# Patient Record
Sex: Female | Born: 1940
Health system: Southern US, Community
[De-identification: ages and names within clinical notes are randomized; demographics above are authoritative.]

## PROBLEM LIST (undated history)

## (undated) DIAGNOSIS — M1990 Unspecified osteoarthritis, unspecified site: Secondary | ICD-10-CM

## (undated) DIAGNOSIS — K219 Gastro-esophageal reflux disease without esophagitis: Secondary | ICD-10-CM

## (undated) DIAGNOSIS — R251 Tremor, unspecified: Secondary | ICD-10-CM

## (undated) DIAGNOSIS — I519 Heart disease, unspecified: Secondary | ICD-10-CM

## (undated) DIAGNOSIS — M069 Rheumatoid arthritis, unspecified: Secondary | ICD-10-CM

## (undated) DIAGNOSIS — E079 Disorder of thyroid, unspecified: Secondary | ICD-10-CM

## (undated) DIAGNOSIS — I4891 Unspecified atrial fibrillation: Secondary | ICD-10-CM

## (undated) HISTORY — PX: EYE SURGERY: SHX253

## (undated) HISTORY — PX: BREAST EXCISIONAL BIOPSY: SUR124

## (undated) HISTORY — DX: Rheumatoid arthritis, unspecified: M06.9

## (undated) HISTORY — PX: CHOLECYSTECTOMY: SHX55

## (undated) HISTORY — PX: CARPAL TUNNEL RELEASE: SHX101

## (undated) HISTORY — DX: Unspecified atrial fibrillation: I48.91

## (undated) HISTORY — DX: Unspecified osteoarthritis, unspecified site: M19.90

## (undated) HISTORY — DX: Gastro-esophageal reflux disease without esophagitis: K21.9

## (undated) HISTORY — DX: Disorder of thyroid, unspecified: E07.9

## (undated) HISTORY — PX: JOINT REPLACEMENT: SHX530

## (undated) HISTORY — DX: Heart disease, unspecified: I51.9

## (undated) HISTORY — DX: Tremor, unspecified: R25.1

---

## 1985-03-06 HISTORY — PX: BREAST BIOPSY: SHX20

## 1986-03-06 HISTORY — PX: BREAST BIOPSY: SHX20

## 1996-03-06 HISTORY — PX: REPLACEMENT TOTAL KNEE: SUR1224

## 2003-03-07 HISTORY — PX: REPLACEMENT TOTAL KNEE: SUR1224

## 2003-11-24 ENCOUNTER — Other Ambulatory Visit: Payer: Self-pay

## 2004-03-06 HISTORY — PX: COLONOSCOPY: SHX174

## 2004-10-28 ENCOUNTER — Ambulatory Visit: Payer: Self-pay | Admitting: Family Medicine

## 2004-11-24 ENCOUNTER — Ambulatory Visit: Payer: Self-pay | Admitting: General Surgery

## 2004-11-24 LAB — HM COLONOSCOPY: HM Colonoscopy: NORMAL

## 2006-03-22 ENCOUNTER — Ambulatory Visit: Payer: Self-pay | Admitting: Family Medicine

## 2006-09-12 ENCOUNTER — Emergency Department: Payer: Self-pay | Admitting: Emergency Medicine

## 2006-09-12 ENCOUNTER — Other Ambulatory Visit: Payer: Self-pay

## 2007-07-10 ENCOUNTER — Ambulatory Visit: Payer: Self-pay | Admitting: Family Medicine

## 2008-08-14 ENCOUNTER — Ambulatory Visit: Payer: Self-pay | Admitting: Family Medicine

## 2008-08-26 DIAGNOSIS — N951 Menopausal and female climacteric states: Secondary | ICD-10-CM | POA: Insufficient documentation

## 2008-11-14 DIAGNOSIS — K219 Gastro-esophageal reflux disease without esophagitis: Secondary | ICD-10-CM | POA: Insufficient documentation

## 2008-11-16 LAB — HM DEXA SCAN: HM Dexa Scan: NORMAL

## 2008-11-21 ENCOUNTER — Ambulatory Visit: Payer: Self-pay | Admitting: Internal Medicine

## 2008-11-27 ENCOUNTER — Ambulatory Visit: Payer: Self-pay | Admitting: Internal Medicine

## 2009-01-21 ENCOUNTER — Ambulatory Visit: Payer: Self-pay | Admitting: Otolaryngology

## 2010-02-02 ENCOUNTER — Ambulatory Visit: Payer: Self-pay | Admitting: Family Medicine

## 2011-04-24 DIAGNOSIS — Z79899 Other long term (current) drug therapy: Secondary | ICD-10-CM | POA: Diagnosis not present

## 2011-04-24 DIAGNOSIS — M069 Rheumatoid arthritis, unspecified: Secondary | ICD-10-CM | POA: Diagnosis not present

## 2011-05-30 ENCOUNTER — Ambulatory Visit: Payer: Self-pay | Admitting: Family Medicine

## 2011-05-30 DIAGNOSIS — Z1231 Encounter for screening mammogram for malignant neoplasm of breast: Secondary | ICD-10-CM | POA: Diagnosis not present

## 2011-06-13 DIAGNOSIS — Z79899 Other long term (current) drug therapy: Secondary | ICD-10-CM | POA: Diagnosis not present

## 2011-06-13 DIAGNOSIS — M069 Rheumatoid arthritis, unspecified: Secondary | ICD-10-CM | POA: Diagnosis not present

## 2011-07-25 DIAGNOSIS — M069 Rheumatoid arthritis, unspecified: Secondary | ICD-10-CM | POA: Diagnosis not present

## 2011-07-25 DIAGNOSIS — M159 Polyosteoarthritis, unspecified: Secondary | ICD-10-CM | POA: Diagnosis not present

## 2011-08-04 DIAGNOSIS — E039 Hypothyroidism, unspecified: Secondary | ICD-10-CM | POA: Diagnosis not present

## 2011-08-04 DIAGNOSIS — Z0000 Encounter for general adult medical examination without abnormal findings: Secondary | ICD-10-CM | POA: Diagnosis not present

## 2011-08-04 DIAGNOSIS — E559 Vitamin D deficiency, unspecified: Secondary | ICD-10-CM | POA: Diagnosis not present

## 2011-08-04 DIAGNOSIS — E7800 Pure hypercholesterolemia, unspecified: Secondary | ICD-10-CM | POA: Diagnosis not present

## 2011-08-04 DIAGNOSIS — I10 Essential (primary) hypertension: Secondary | ICD-10-CM | POA: Diagnosis not present

## 2011-08-04 DIAGNOSIS — IMO0001 Reserved for inherently not codable concepts without codable children: Secondary | ICD-10-CM | POA: Diagnosis not present

## 2011-08-04 DIAGNOSIS — K219 Gastro-esophageal reflux disease without esophagitis: Secondary | ICD-10-CM | POA: Diagnosis not present

## 2011-09-05 DIAGNOSIS — M069 Rheumatoid arthritis, unspecified: Secondary | ICD-10-CM | POA: Diagnosis not present

## 2011-09-05 DIAGNOSIS — Z79899 Other long term (current) drug therapy: Secondary | ICD-10-CM | POA: Diagnosis not present

## 2011-10-16 DIAGNOSIS — M069 Rheumatoid arthritis, unspecified: Secondary | ICD-10-CM | POA: Diagnosis not present

## 2011-12-04 DIAGNOSIS — Z79899 Other long term (current) drug therapy: Secondary | ICD-10-CM | POA: Diagnosis not present

## 2011-12-04 DIAGNOSIS — M069 Rheumatoid arthritis, unspecified: Secondary | ICD-10-CM | POA: Diagnosis not present

## 2011-12-27 DIAGNOSIS — Z23 Encounter for immunization: Secondary | ICD-10-CM | POA: Diagnosis not present

## 2012-01-18 DIAGNOSIS — H25049 Posterior subcapsular polar age-related cataract, unspecified eye: Secondary | ICD-10-CM | POA: Diagnosis not present

## 2012-01-22 DIAGNOSIS — M069 Rheumatoid arthritis, unspecified: Secondary | ICD-10-CM | POA: Diagnosis not present

## 2012-01-22 DIAGNOSIS — M545 Low back pain: Secondary | ICD-10-CM | POA: Diagnosis not present

## 2012-01-22 DIAGNOSIS — Z79899 Other long term (current) drug therapy: Secondary | ICD-10-CM | POA: Diagnosis not present

## 2012-03-12 DIAGNOSIS — Z79899 Other long term (current) drug therapy: Secondary | ICD-10-CM | POA: Diagnosis not present

## 2012-03-12 DIAGNOSIS — M069 Rheumatoid arthritis, unspecified: Secondary | ICD-10-CM | POA: Diagnosis not present

## 2012-04-17 DIAGNOSIS — Z0000 Encounter for general adult medical examination without abnormal findings: Secondary | ICD-10-CM | POA: Diagnosis not present

## 2012-04-17 DIAGNOSIS — E039 Hypothyroidism, unspecified: Secondary | ICD-10-CM | POA: Diagnosis not present

## 2012-04-17 DIAGNOSIS — N3090 Cystitis, unspecified without hematuria: Secondary | ICD-10-CM | POA: Diagnosis not present

## 2012-04-17 DIAGNOSIS — J069 Acute upper respiratory infection, unspecified: Secondary | ICD-10-CM | POA: Diagnosis not present

## 2012-05-09 DIAGNOSIS — M069 Rheumatoid arthritis, unspecified: Secondary | ICD-10-CM | POA: Diagnosis not present

## 2012-05-09 DIAGNOSIS — Z79899 Other long term (current) drug therapy: Secondary | ICD-10-CM | POA: Diagnosis not present

## 2012-07-01 DIAGNOSIS — M25519 Pain in unspecified shoulder: Secondary | ICD-10-CM | POA: Diagnosis not present

## 2012-07-01 DIAGNOSIS — M069 Rheumatoid arthritis, unspecified: Secondary | ICD-10-CM | POA: Diagnosis not present

## 2012-07-18 DIAGNOSIS — H1045 Other chronic allergic conjunctivitis: Secondary | ICD-10-CM | POA: Diagnosis not present

## 2012-07-30 DIAGNOSIS — J019 Acute sinusitis, unspecified: Secondary | ICD-10-CM | POA: Diagnosis not present

## 2012-07-30 DIAGNOSIS — J069 Acute upper respiratory infection, unspecified: Secondary | ICD-10-CM | POA: Diagnosis not present

## 2012-07-30 DIAGNOSIS — E039 Hypothyroidism, unspecified: Secondary | ICD-10-CM | POA: Diagnosis not present

## 2012-07-30 DIAGNOSIS — Z0000 Encounter for general adult medical examination without abnormal findings: Secondary | ICD-10-CM | POA: Diagnosis not present

## 2012-08-20 DIAGNOSIS — M069 Rheumatoid arthritis, unspecified: Secondary | ICD-10-CM | POA: Diagnosis not present

## 2012-08-20 DIAGNOSIS — Z79899 Other long term (current) drug therapy: Secondary | ICD-10-CM | POA: Diagnosis not present

## 2012-10-08 ENCOUNTER — Ambulatory Visit: Payer: Self-pay | Admitting: Family Medicine

## 2012-10-08 DIAGNOSIS — Z1231 Encounter for screening mammogram for malignant neoplasm of breast: Secondary | ICD-10-CM | POA: Diagnosis not present

## 2012-10-08 LAB — HM MAMMOGRAPHY

## 2012-10-09 DIAGNOSIS — M069 Rheumatoid arthritis, unspecified: Secondary | ICD-10-CM | POA: Diagnosis not present

## 2012-10-09 DIAGNOSIS — Z79899 Other long term (current) drug therapy: Secondary | ICD-10-CM | POA: Diagnosis not present

## 2012-11-04 DIAGNOSIS — N390 Urinary tract infection, site not specified: Secondary | ICD-10-CM | POA: Diagnosis not present

## 2012-11-04 DIAGNOSIS — R300 Dysuria: Secondary | ICD-10-CM | POA: Diagnosis not present

## 2012-11-11 DIAGNOSIS — E039 Hypothyroidism, unspecified: Secondary | ICD-10-CM | POA: Diagnosis not present

## 2012-11-11 DIAGNOSIS — Z0000 Encounter for general adult medical examination without abnormal findings: Secondary | ICD-10-CM | POA: Diagnosis not present

## 2012-11-11 DIAGNOSIS — G250 Essential tremor: Secondary | ICD-10-CM | POA: Diagnosis not present

## 2012-11-11 DIAGNOSIS — G4700 Insomnia, unspecified: Secondary | ICD-10-CM | POA: Diagnosis not present

## 2012-11-14 DIAGNOSIS — E039 Hypothyroidism, unspecified: Secondary | ICD-10-CM | POA: Diagnosis not present

## 2012-11-14 DIAGNOSIS — I10 Essential (primary) hypertension: Secondary | ICD-10-CM | POA: Diagnosis not present

## 2012-11-14 DIAGNOSIS — E7800 Pure hypercholesterolemia, unspecified: Secondary | ICD-10-CM | POA: Diagnosis not present

## 2012-11-14 DIAGNOSIS — IMO0001 Reserved for inherently not codable concepts without codable children: Secondary | ICD-10-CM | POA: Diagnosis not present

## 2012-11-19 DIAGNOSIS — M069 Rheumatoid arthritis, unspecified: Secondary | ICD-10-CM | POA: Diagnosis not present

## 2012-12-11 DIAGNOSIS — G250 Essential tremor: Secondary | ICD-10-CM | POA: Diagnosis not present

## 2012-12-24 DIAGNOSIS — Z23 Encounter for immunization: Secondary | ICD-10-CM | POA: Diagnosis not present

## 2013-01-06 DIAGNOSIS — M069 Rheumatoid arthritis, unspecified: Secondary | ICD-10-CM | POA: Diagnosis not present

## 2013-01-06 DIAGNOSIS — M25519 Pain in unspecified shoulder: Secondary | ICD-10-CM | POA: Diagnosis not present

## 2013-01-06 DIAGNOSIS — M159 Polyosteoarthritis, unspecified: Secondary | ICD-10-CM | POA: Diagnosis not present

## 2013-01-06 DIAGNOSIS — Z79899 Other long term (current) drug therapy: Secondary | ICD-10-CM | POA: Diagnosis not present

## 2013-01-27 DIAGNOSIS — Z0000 Encounter for general adult medical examination without abnormal findings: Secondary | ICD-10-CM | POA: Diagnosis not present

## 2013-01-27 DIAGNOSIS — N3000 Acute cystitis without hematuria: Secondary | ICD-10-CM | POA: Diagnosis not present

## 2013-01-27 DIAGNOSIS — G250 Essential tremor: Secondary | ICD-10-CM | POA: Diagnosis not present

## 2013-01-27 DIAGNOSIS — N3090 Cystitis, unspecified without hematuria: Secondary | ICD-10-CM | POA: Diagnosis not present

## 2013-01-27 DIAGNOSIS — E039 Hypothyroidism, unspecified: Secondary | ICD-10-CM | POA: Diagnosis not present

## 2013-02-24 DIAGNOSIS — M069 Rheumatoid arthritis, unspecified: Secondary | ICD-10-CM | POA: Diagnosis not present

## 2013-04-07 DIAGNOSIS — M069 Rheumatoid arthritis, unspecified: Secondary | ICD-10-CM | POA: Diagnosis not present

## 2013-04-07 DIAGNOSIS — Z79899 Other long term (current) drug therapy: Secondary | ICD-10-CM | POA: Diagnosis not present

## 2013-04-16 DIAGNOSIS — G250 Essential tremor: Secondary | ICD-10-CM | POA: Diagnosis not present

## 2013-04-16 DIAGNOSIS — E669 Obesity, unspecified: Secondary | ICD-10-CM | POA: Diagnosis not present

## 2013-05-20 DIAGNOSIS — M069 Rheumatoid arthritis, unspecified: Secondary | ICD-10-CM | POA: Diagnosis not present

## 2013-06-11 DIAGNOSIS — J329 Chronic sinusitis, unspecified: Secondary | ICD-10-CM | POA: Diagnosis not present

## 2013-06-11 DIAGNOSIS — R259 Unspecified abnormal involuntary movements: Secondary | ICD-10-CM | POA: Diagnosis not present

## 2013-06-11 DIAGNOSIS — M069 Rheumatoid arthritis, unspecified: Secondary | ICD-10-CM | POA: Insufficient documentation

## 2013-06-11 DIAGNOSIS — M1990 Unspecified osteoarthritis, unspecified site: Secondary | ICD-10-CM | POA: Insufficient documentation

## 2013-06-11 DIAGNOSIS — Z09 Encounter for follow-up examination after completed treatment for conditions other than malignant neoplasm: Secondary | ICD-10-CM | POA: Diagnosis not present

## 2013-06-11 DIAGNOSIS — Z0000 Encounter for general adult medical examination without abnormal findings: Secondary | ICD-10-CM | POA: Diagnosis not present

## 2013-06-17 DIAGNOSIS — M542 Cervicalgia: Secondary | ICD-10-CM | POA: Diagnosis not present

## 2013-06-17 DIAGNOSIS — M069 Rheumatoid arthritis, unspecified: Secondary | ICD-10-CM | POA: Diagnosis not present

## 2013-06-17 DIAGNOSIS — M25519 Pain in unspecified shoulder: Secondary | ICD-10-CM | POA: Diagnosis not present

## 2013-06-19 DIAGNOSIS — G250 Essential tremor: Secondary | ICD-10-CM | POA: Diagnosis not present

## 2013-06-26 DIAGNOSIS — R6889 Other general symptoms and signs: Secondary | ICD-10-CM | POA: Diagnosis not present

## 2013-07-01 DIAGNOSIS — M069 Rheumatoid arthritis, unspecified: Secondary | ICD-10-CM | POA: Diagnosis not present

## 2013-07-01 DIAGNOSIS — Z79899 Other long term (current) drug therapy: Secondary | ICD-10-CM | POA: Diagnosis not present

## 2013-07-23 DIAGNOSIS — Z0000 Encounter for general adult medical examination without abnormal findings: Secondary | ICD-10-CM | POA: Diagnosis not present

## 2013-07-23 DIAGNOSIS — M069 Rheumatoid arthritis, unspecified: Secondary | ICD-10-CM | POA: Diagnosis not present

## 2013-07-23 DIAGNOSIS — R259 Unspecified abnormal involuntary movements: Secondary | ICD-10-CM | POA: Diagnosis not present

## 2013-07-23 DIAGNOSIS — N3090 Cystitis, unspecified without hematuria: Secondary | ICD-10-CM | POA: Diagnosis not present

## 2013-08-19 DIAGNOSIS — M069 Rheumatoid arthritis, unspecified: Secondary | ICD-10-CM | POA: Diagnosis not present

## 2013-08-19 DIAGNOSIS — Z79899 Other long term (current) drug therapy: Secondary | ICD-10-CM | POA: Diagnosis not present

## 2013-08-28 DIAGNOSIS — M069 Rheumatoid arthritis, unspecified: Secondary | ICD-10-CM | POA: Diagnosis not present

## 2013-08-28 DIAGNOSIS — R259 Unspecified abnormal involuntary movements: Secondary | ICD-10-CM | POA: Diagnosis not present

## 2013-08-28 DIAGNOSIS — I4891 Unspecified atrial fibrillation: Secondary | ICD-10-CM | POA: Diagnosis not present

## 2013-08-28 DIAGNOSIS — Z0000 Encounter for general adult medical examination without abnormal findings: Secondary | ICD-10-CM | POA: Diagnosis not present

## 2013-08-29 DIAGNOSIS — I4891 Unspecified atrial fibrillation: Secondary | ICD-10-CM | POA: Diagnosis not present

## 2013-09-04 DIAGNOSIS — I4891 Unspecified atrial fibrillation: Secondary | ICD-10-CM | POA: Diagnosis not present

## 2013-09-10 DIAGNOSIS — I4891 Unspecified atrial fibrillation: Secondary | ICD-10-CM | POA: Diagnosis not present

## 2013-09-17 DIAGNOSIS — I4891 Unspecified atrial fibrillation: Secondary | ICD-10-CM | POA: Diagnosis not present

## 2013-09-30 DIAGNOSIS — M069 Rheumatoid arthritis, unspecified: Secondary | ICD-10-CM | POA: Diagnosis not present

## 2013-10-22 DIAGNOSIS — M069 Rheumatoid arthritis, unspecified: Secondary | ICD-10-CM | POA: Diagnosis not present

## 2013-10-22 DIAGNOSIS — I4891 Unspecified atrial fibrillation: Secondary | ICD-10-CM | POA: Diagnosis not present

## 2013-10-22 DIAGNOSIS — K219 Gastro-esophageal reflux disease without esophagitis: Secondary | ICD-10-CM | POA: Diagnosis not present

## 2013-10-22 DIAGNOSIS — Z0000 Encounter for general adult medical examination without abnormal findings: Secondary | ICD-10-CM | POA: Diagnosis not present

## 2013-10-23 ENCOUNTER — Encounter: Payer: Self-pay | Admitting: *Deleted

## 2013-11-05 ENCOUNTER — Encounter: Payer: Self-pay | Admitting: General Surgery

## 2013-11-06 ENCOUNTER — Encounter: Payer: Self-pay | Admitting: General Surgery

## 2013-11-06 ENCOUNTER — Ambulatory Visit (INDEPENDENT_AMBULATORY_CARE_PROVIDER_SITE_OTHER): Payer: Medicare Other | Admitting: General Surgery

## 2013-11-06 VITALS — BP 118/68 | HR 62 | Resp 14 | Ht 63.5 in | Wt 248.0 lb

## 2013-11-06 DIAGNOSIS — K219 Gastro-esophageal reflux disease without esophagitis: Secondary | ICD-10-CM | POA: Diagnosis not present

## 2013-11-06 NOTE — Patient Instructions (Signed)
Patient advised to check her pulse rate 4 times a day for 1 week. After 1 week start taking 1 Metoprolol at bedtime for 1 week. Keep a record of your pulse rate and return record to our office. Patient to return as needed.

## 2013-11-06 NOTE — Progress Notes (Signed)
Patient ID: Glenda Williams, female   DOB: [birth year], 73 y.o.   MRN: [id number]  Chief Complaint  Patient presents with  . Other    reflux disease    HPI Glenda Williams is a 73 y.o. female.  Here today for evaluation of reflux disease. She states she has had this issue for approximately 2 months. She believes it's associated around a new medication that she started taking at that time.  She describes her symptoms as boating and burning sensation in the stomach and neck and chest area. She has experienced some belching and gas. She has recently been diagnosed with Afib and is unsure is this is all related. Patient started new tremors medication approximately 7 months ago. She also started Metoprolol 2 months ago. Patient to see Dr. Ubaldo Williams on December 16, 2013. She seems to notice the heart burn issues after taking her medications. She does take her medications after eating. These episodes last for approximately 30 minutes at a time. No trouble swallowing.  The patient is able to get rapid relief of her symptoms with Gas-X.  HPI  Past Medical History  Diagnosis Date  . GERD (gastroesophageal reflux disease)   . Heart disease   . Thyroid disease   . Arthritis   . Rheumatoid arthritis   . A-fib   . Tremors of nervous system     Past Surgical History  Procedure Laterality Date  . Cholecystectomy    . Joint replacement Bilateral     knees  . Carpal tunnel release Bilateral   . Colonoscopy  2006  . Breast biopsy Left 1988    Family History  Problem Relation Age of Onset  . Stroke Mother   . Heart disease Father     Social History History  Substance Use Topics  . Smoking status: Never Smoker   . Smokeless tobacco: Not on file  . Alcohol Use: No    Allergies  Allergen Reactions  . Penicillins Rash    Current Outpatient Prescriptions  Medication Sig Dispense Refill  . aspirin 81 MG tablet Take 81 mg by mouth daily.      . cholecalciferol (VITAMIN D) 1000 UNITS tablet Take  1,000 Units by mouth daily.      . folic acid (FOLVITE) 1 MG tablet Take 1 mg by mouth daily.      . hydrochlorothiazide (HYDRODIURIL) 12.5 MG tablet Take 12.5 mg by mouth daily.      Marland Kitchen ibuprofen (ADVIL,MOTRIN) 800 MG tablet Take 800 mg by mouth every 6 (six) hours as needed.       . InFLIXimab (REMICADE IV) Inject into the vein. Every 6-8 weeks      . levothyroxine (SYNTHROID, LEVOTHROID) 75 MCG tablet Take 75 mcg by mouth daily before breakfast.       . methotrexate (RHEUMATREX) 2.5 MG tablet 8 tabs by mouth weekly      . metoprolol tartrate (LOPRESSOR) 25 MG tablet Take 25 mg by mouth 2 (two) times daily.       . Omega-3 Fatty Acids (FISH OIL) 1000 MG CAPS Take 1 capsule by mouth daily.      Marland Kitchen omeprazole (PRILOSEC) 20 MG capsule Take 20 mg by mouth 2 (two) times daily before a meal.       . primidone (MYSOLINE) 50 MG tablet Take 50 mg by mouth at bedtime.       . Red Yeast Rice 600 MG CAPS Take 1 capsule by mouth daily.      Marland Kitchen  Simethicone (GAS-X PO) Take 2 capsules by mouth at bedtime.       No current facility-administered medications for this visit.    Review of Systems Review of Systems  Constitutional: Negative.   Cardiovascular: Negative.     Blood pressure 118/68, pulse 62, resp. rate 14, height 5' 3.5" (1.613 m), weight 248 lb (112.492 kg).  Physical Exam Physical Exam  Constitutional: She is oriented to person, place, and time. She appears well-developed and well-nourished.  Cardiovascular: Normal rate, regular rhythm and normal heart sounds.   No murmur heard. Edema present in lower legs and feet  Pulmonary/Chest: Effort normal and breath sounds normal.  Abdominal: Soft. Normal appearance and bowel sounds are normal. There is no hepatosplenomegaly. There is no tenderness.  Neurological: She is alert and oriented to person, place, and time.  Skin: Skin is warm and dry.    Data Reviewed PCP notes of October 22, 2013 were reviewed.  Assessment    Heartburn,  temporally related to initiation of metoprolol.     Plan    The patient has been asked to check her pulse 4 times a day for the next week. After this trial period, she will decrease her metoprolol to a 25 mg dose at h.s. And continue for time per day pulse checked. Assuming no change, we'll see if her heartburn improved with the lower dose of metoprolol, with less side effects in regards to decreased energy.      PCP and Ref. MD: Glenda Williams 11/07/2013, 7:26 PM

## 2013-11-07 DIAGNOSIS — K219 Gastro-esophageal reflux disease without esophagitis: Secondary | ICD-10-CM | POA: Insufficient documentation

## 2013-11-11 DIAGNOSIS — M069 Rheumatoid arthritis, unspecified: Secondary | ICD-10-CM | POA: Diagnosis not present

## 2013-11-11 DIAGNOSIS — Z79899 Other long term (current) drug therapy: Secondary | ICD-10-CM | POA: Diagnosis not present

## 2013-11-27 ENCOUNTER — Telehealth: Payer: Self-pay | Admitting: General Surgery

## 2013-11-27 NOTE — Telephone Encounter (Signed)
The patient had checked her pulse has requested on twice a day and then daily doses of metoprolol. No significant interval change. Her note reports that her energy is improved and she has otherwise felt well. Will have her discuss continuing on the decreased dosage of the beta blocker with her PCP. Her voice mailbox was full, and the office number was keyed insert she could call back for this information.

## 2013-12-01 ENCOUNTER — Encounter: Payer: Self-pay | Admitting: General Surgery

## 2013-12-01 DIAGNOSIS — L578 Other skin changes due to chronic exposure to nonionizing radiation: Secondary | ICD-10-CM | POA: Diagnosis not present

## 2013-12-01 DIAGNOSIS — D234 Other benign neoplasm of skin of scalp and neck: Secondary | ICD-10-CM | POA: Diagnosis not present

## 2013-12-01 DIAGNOSIS — D485 Neoplasm of uncertain behavior of skin: Secondary | ICD-10-CM | POA: Diagnosis not present

## 2013-12-16 DIAGNOSIS — R251 Tremor, unspecified: Secondary | ICD-10-CM | POA: Diagnosis not present

## 2013-12-16 DIAGNOSIS — I482 Chronic atrial fibrillation: Secondary | ICD-10-CM | POA: Diagnosis not present

## 2013-12-16 DIAGNOSIS — I10 Essential (primary) hypertension: Secondary | ICD-10-CM | POA: Diagnosis not present

## 2013-12-23 DIAGNOSIS — M0609 Rheumatoid arthritis without rheumatoid factor, multiple sites: Secondary | ICD-10-CM | POA: Diagnosis not present

## 2014-01-05 ENCOUNTER — Encounter: Payer: Self-pay | Admitting: General Surgery

## 2014-02-09 DIAGNOSIS — Z79899 Other long term (current) drug therapy: Secondary | ICD-10-CM | POA: Diagnosis not present

## 2014-02-09 DIAGNOSIS — M0589 Other rheumatoid arthritis with rheumatoid factor of multiple sites: Secondary | ICD-10-CM | POA: Diagnosis not present

## 2014-02-12 ENCOUNTER — Observation Stay: Payer: Self-pay | Admitting: Internal Medicine

## 2014-02-12 DIAGNOSIS — Z833 Family history of diabetes mellitus: Secondary | ICD-10-CM | POA: Diagnosis not present

## 2014-02-12 DIAGNOSIS — I480 Paroxysmal atrial fibrillation: Secondary | ICD-10-CM | POA: Diagnosis not present

## 2014-02-12 DIAGNOSIS — Z8249 Family history of ischemic heart disease and other diseases of the circulatory system: Secondary | ICD-10-CM | POA: Diagnosis not present

## 2014-02-12 DIAGNOSIS — Z96653 Presence of artificial knee joint, bilateral: Secondary | ICD-10-CM | POA: Diagnosis not present

## 2014-02-12 DIAGNOSIS — I4891 Unspecified atrial fibrillation: Secondary | ICD-10-CM | POA: Diagnosis not present

## 2014-02-12 DIAGNOSIS — Z7982 Long term (current) use of aspirin: Secondary | ICD-10-CM | POA: Diagnosis not present

## 2014-02-12 DIAGNOSIS — I10 Essential (primary) hypertension: Secondary | ICD-10-CM | POA: Diagnosis not present

## 2014-02-12 DIAGNOSIS — Z820 Family history of epilepsy and other diseases of the nervous system: Secondary | ICD-10-CM | POA: Diagnosis not present

## 2014-02-12 DIAGNOSIS — Z880 Allergy status to penicillin: Secondary | ICD-10-CM | POA: Diagnosis not present

## 2014-02-12 DIAGNOSIS — R079 Chest pain, unspecified: Secondary | ICD-10-CM | POA: Diagnosis not present

## 2014-02-12 DIAGNOSIS — I38 Endocarditis, valve unspecified: Secondary | ICD-10-CM | POA: Diagnosis not present

## 2014-02-12 DIAGNOSIS — R0789 Other chest pain: Secondary | ICD-10-CM | POA: Diagnosis not present

## 2014-02-12 DIAGNOSIS — M069 Rheumatoid arthritis, unspecified: Secondary | ICD-10-CM | POA: Diagnosis not present

## 2014-02-12 LAB — COMPREHENSIVE METABOLIC PANEL
ALBUMIN: 3.3 g/dL — AB (ref 3.4–5.0)
ALT: 30 U/L
ANION GAP: 6 — AB (ref 7–16)
Alkaline Phosphatase: 82 U/L
BUN: 12 mg/dL (ref 7–18)
Bilirubin,Total: 0.6 mg/dL (ref 0.2–1.0)
CHLORIDE: 107 mmol/L (ref 98–107)
CREATININE: 0.74 mg/dL (ref 0.60–1.30)
Calcium, Total: 8.8 mg/dL (ref 8.5–10.1)
Co2: 29 mmol/L (ref 21–32)
EGFR (Non-African Amer.): >60
GLUCOSE: 100 mg/dL — AB (ref 65–99)
OSMOLALITY: 283 (ref 275–301)
Potassium: 3.9 mmol/L (ref 3.5–5.1)
SGOT(AST): 27 U/L (ref 15–37)
Sodium: 142 mmol/L (ref 136–145)
Total Protein: 7.7 g/dL (ref 6.4–8.2)

## 2014-02-12 LAB — PRO B NATRIURETIC PEPTIDE: B-Type Natriuretic Peptide: 1336 pg/mL — ABNORMAL HIGH (ref 0–125)

## 2014-02-12 LAB — CBC
HCT: 44.8 % (ref 35.0–47.0)
HGB: 14.5 g/dL (ref 12.0–16.0)
MCH: 30.2 pg (ref 26.0–34.0)
MCHC: 32.4 g/dL (ref 32.0–36.0)
MCV: 93 fL (ref 80–100)
Platelet: 281 10*3/uL (ref 150–440)
RBC: 4.81 10*6/uL (ref 3.80–5.20)
RDW: 14.7 % — ABNORMAL HIGH (ref 11.5–14.5)
WBC: 13.2 10*3/uL — AB (ref 3.6–11.0)

## 2014-02-12 LAB — TROPONIN I
Troponin-I: <0.02 ng/mL
Troponin-I: 0.02 ng/mL

## 2014-02-12 LAB — CK TOTAL AND CKMB (NOT AT ARMC)
CK, Total: 130 U/L (ref 26–192)
CK, Total: 110 U/L (ref 26–192)
CK, Total: 100 U/L (ref 26–192)
CK-MB: 2.8 ng/mL (ref 0.5–3.6)
CK-MB: 2.4 ng/mL (ref 0.5–3.6)
CK-MB: 2.1 ng/mL (ref 0.5–3.6)

## 2014-02-12 LAB — APTT: Activated PTT: 28.7 secs (ref 23.6–35.9)

## 2014-02-12 LAB — PROTIME-INR
INR: 1
PROTHROMBIN TIME: 13.5 s (ref 11.5–14.7)

## 2014-02-13 DIAGNOSIS — I10 Essential (primary) hypertension: Secondary | ICD-10-CM | POA: Diagnosis not present

## 2014-02-13 DIAGNOSIS — M069 Rheumatoid arthritis, unspecified: Secondary | ICD-10-CM | POA: Diagnosis not present

## 2014-02-13 DIAGNOSIS — I4891 Unspecified atrial fibrillation: Secondary | ICD-10-CM | POA: Diagnosis not present

## 2014-02-13 DIAGNOSIS — R079 Chest pain, unspecified: Secondary | ICD-10-CM | POA: Diagnosis not present

## 2014-02-13 LAB — BASIC METABOLIC PANEL
Anion Gap: 6 — ABNORMAL LOW (ref 7–16)
BUN: 14 mg/dL (ref 7–18)
CALCIUM: 8.6 mg/dL (ref 8.5–10.1)
CO2: 29 mmol/L (ref 21–32)
Chloride: 107 mmol/L (ref 98–107)
Creatinine: 0.77 mg/dL (ref 0.60–1.30)
EGFR (African American): >60
EGFR (Non-African Amer.): >60
GLUCOSE: 92 mg/dL (ref 65–99)
Osmolality: 283 (ref 275–301)
POTASSIUM: 3.8 mmol/L (ref 3.5–5.1)
SODIUM: 142 mmol/L (ref 136–145)

## 2014-02-13 LAB — LIPID PANEL
Cholesterol: 143 mg/dL (ref 0–200)
HDL Cholesterol: 31 mg/dL — ABNORMAL LOW (ref 40–60)
LDL CHOLESTEROL, CALC: 95 mg/dL (ref 0–100)
Triglycerides: 87 mg/dL (ref 0–200)
VLDL Cholesterol, Calc: 17 mg/dL (ref 5–40)

## 2014-02-13 LAB — TSH: Thyroid Stimulating Horm: 5.8 u[IU]/mL — ABNORMAL HIGH

## 2014-02-16 DIAGNOSIS — M792 Neuralgia and neuritis, unspecified: Secondary | ICD-10-CM | POA: Diagnosis not present

## 2014-02-16 DIAGNOSIS — F419 Anxiety disorder, unspecified: Secondary | ICD-10-CM | POA: Diagnosis not present

## 2014-02-16 DIAGNOSIS — F439 Reaction to severe stress, unspecified: Secondary | ICD-10-CM | POA: Diagnosis not present

## 2014-02-18 DIAGNOSIS — I482 Chronic atrial fibrillation: Secondary | ICD-10-CM | POA: Diagnosis not present

## 2014-02-18 DIAGNOSIS — R251 Tremor, unspecified: Secondary | ICD-10-CM | POA: Diagnosis not present

## 2014-02-18 DIAGNOSIS — I10 Essential (primary) hypertension: Secondary | ICD-10-CM | POA: Diagnosis not present

## 2014-02-23 DIAGNOSIS — M792 Neuralgia and neuritis, unspecified: Secondary | ICD-10-CM | POA: Diagnosis not present

## 2014-02-23 DIAGNOSIS — Z658 Other specified problems related to psychosocial circumstances: Secondary | ICD-10-CM | POA: Diagnosis not present

## 2014-02-23 DIAGNOSIS — I480 Paroxysmal atrial fibrillation: Secondary | ICD-10-CM | POA: Diagnosis not present

## 2014-02-23 DIAGNOSIS — F411 Generalized anxiety disorder: Secondary | ICD-10-CM | POA: Diagnosis not present

## 2014-03-16 DIAGNOSIS — Z658 Other specified problems related to psychosocial circumstances: Secondary | ICD-10-CM | POA: Diagnosis not present

## 2014-03-16 DIAGNOSIS — I480 Paroxysmal atrial fibrillation: Secondary | ICD-10-CM | POA: Diagnosis not present

## 2014-03-16 DIAGNOSIS — M792 Neuralgia and neuritis, unspecified: Secondary | ICD-10-CM | POA: Diagnosis not present

## 2014-03-16 DIAGNOSIS — F411 Generalized anxiety disorder: Secondary | ICD-10-CM | POA: Diagnosis not present

## 2014-03-30 DIAGNOSIS — M0579 Rheumatoid arthritis with rheumatoid factor of multiple sites without organ or systems involvement: Secondary | ICD-10-CM | POA: Diagnosis not present

## 2014-04-08 DIAGNOSIS — F419 Anxiety disorder, unspecified: Secondary | ICD-10-CM | POA: Diagnosis not present

## 2014-04-08 DIAGNOSIS — J309 Allergic rhinitis, unspecified: Secondary | ICD-10-CM | POA: Diagnosis not present

## 2014-04-08 DIAGNOSIS — I480 Paroxysmal atrial fibrillation: Secondary | ICD-10-CM | POA: Diagnosis not present

## 2014-05-18 DIAGNOSIS — M0579 Rheumatoid arthritis with rheumatoid factor of multiple sites without organ or systems involvement: Secondary | ICD-10-CM | POA: Diagnosis not present

## 2014-05-18 DIAGNOSIS — Z79899 Other long term (current) drug therapy: Secondary | ICD-10-CM | POA: Diagnosis not present

## 2014-06-27 NOTE — H&P (Signed)
PATIENT NAME:  Glenda Williams, Glenda Williams MR#:  [id number] DATE OF BIRTH:  November 06, 1940  DATE OF ADMISSION:  02/12/2014  PRIMARY CARE PROVIDER:  Margarita Rana, MD  EMERGENCY DEPARTMENT REFERRING PHYSICIAN: Jimmye Norman, MD   CHIEF COMPLAINT: Left-sided chest pain.   HISTORY OF PRESENT ILLNESS: The patient is a pleasant 74 year old white female with history of hypertension, Bell's palsy, and atrial fibrillation, which was diagnosed about 4 to 5 months ago, rheumatoid arthritis who has a history of having a cardiac catheterization done in 2005, which was completely normal. Reports that she started having pain in the left side of her shoulder which she describes it as a burning type of pain; subsequently, she started having left-sided chest pain, which she describes it again burning type of sensation. There was no nausea, vomiting; no radiation of the pain. She also has shortness of breath. She has not had any fevers or chills; no nausea, vomiting or diarrhea.   PAST MEDICAL HISTORY: Significant for hypertension, history of Bell palsy, history of atrial fibrillation, history of rheumatoid arthritis.   PAST SURGICAL HISTORY: Status post bilateral total knee replacement, breast biopsy, cholecystectomy.   ALLERGIES: PENICILLIN.   MEDICATIONS: She is not sure what medication she is taking, but has a list the car which the husband will bring soon, which we will update.   SOCIAL HISTORY: Does not smoke, does not drink, no drug use.   FAMILY HISTORY: Of coronary artery disease and Parkinson's.   REVIEW OF SYSTEMS:   CONSTITUTIONAL: No fevers, fatigue, no weakness, no weight loss, or weight gain.  EYES: No blurred or double vision. No redness. No inflammation. No glaucoma.  ENT: No tinnitus. No ear pain. No hearing loss. No seasonal or year-round allergies. No difficulty swallowing.  RESPIRATORY: Denies any cough, wheezing, hemoptysis. No COPD.  CARDIOVASCULAR: Complains of chest pain as above. No orthopnea. No  edema. No arrhythmia. No syncope.  GASTROINTESTINAL: No nausea, vomiting, diarrhea. No abdominal pain. No hematemesis. No melena. No IBS. No jaundice.  GENITOURINARY: Denies any dysuria, hematuria, renal calculus, or frequency.  ENDOCRINE: Denies any polyuria, nocturia or thyroid problems.  HEMATOLOGIC AND LYMPHATIC: Denies anemia, easy bruisability or bleeding.  SKIN: No acne. No rash.  MUSCULOSKELETAL: Denies any pain in the neck, back or shoulder, has rheumatoid arthritis. NEUROLOGIC: No numbness, CVA, TIA, or seizures.  PSYCHIATRIC: No anxiety, insomnia, or ADD.   PHYSICAL EXAMINATION: VITAL SIGNS: Temperature 97.6, pulse 95, respirations 24, blood pressure 152/91, O2 of 95%.  GENERAL: The patient is a morbidly obese female in no acute distress.  HEENT: Head atraumatic, normocephalic. Pupils equally round, reactive to light and accommodation. There is no conjunctival pallor. No sclerae icterus. Nasal exam shows no drainage or ulceration.  OROPHARYNX: Clear without any exudate.  NECK: Supple without any thyromegaly.  CARDIOVASCULAR: Regular rate and rhythm. No murmurs, rubs, clicks, or gallops.  LUNGS: Clear to auscultation bilaterally without any rales, rhonchi, wheezing.  ABDOMEN: Soft, nontender, nondistended. Positive sounds x 4.  EXTREMITIES: No clubbing, cyanosis, or edema.  SKIN: No rash.  LYMPH NODES: Nonpalpable.  VASCULAR: Good DP, PT pulses.  PSYCHIATRIC: Not anxious or depressed.  NEUROLOGIC: Awake, alert, oriented x 3.   EKG shows atrial fibrillation with ST depression 1 mm in the lateral leads, otherwise nonspecific ST-T wave changes. Chest x-ray shows cardiomegaly with mild vascular congestion, negative for edema.   LABORATORY DATA: Glucose 100, BUN 12, creatinine 0.74, sodium 142, potassium 3.9, chloride 107, CO2 is 29, calcium 8.8, LFTs: Total protein 7.7, albumin 3.3,  bilirubin total 0.6, alkaline phosphatase 82, AST 27, ALT 30. CPK 130, troponin less than 0.02. WBC  13.2, hemoglobin 14.5, platelet count 281,000, INR is 1.   ASSESSMENT AND PLAN: The patient is a 74 year old white female with history of hypertension, atrial fibrillation, rheumatoid arthritis, presents with chest pain, negative cardiac enzymes.  1.  Chest pain, atypical in nature; however, due to her symptoms and EKG changes, we will admit for observation, do serial enzymes. Cardiology consult, place her on aspirin, we will do a Lexiscan MIBI in the a.m.  2.  Hypertension. Resume home medications when available.   3.  Rheumatoid arthritis hold her rheumatoid arthritis medication,  4.  Atrial fibrillation. Continue aspirin. We will resume beta blocker or calcium channel blocker if she is on that, once the list is available.  5.  Miscellaneous. The patient will be on Lovenox for deep vein thrombosis prophylaxis.   TIME SPENT ON THIS PATIENT: Was 50 minutes.    ____________________________ Lafonda Mosses Posey Pronto, MD shp:nt D: 02/12/2014 15:21:17 ET T: 02/12/2014 15:32:36 ET JOB#: [id number]  cc: Aleksis Jiggetts H. Posey Pronto, MD, <Dictator> Alric Seton MD ELECTRONICALLY SIGNED 02/18/2014 10:38

## 2014-06-27 NOTE — Discharge Summary (Signed)
PATIENT NAME:  Glenda Williams, Glenda Williams MR#:  [id number] DATE OF BIRTH:  1940-12-03  DATE OF ADMISSION:  02/12/2014 DATE OF DISCHARGE:  02/13/2014  PRIMARY CARE PHYSICIAN:  Dr. Venia Minks.     DISCHARGE DIAGNOSES:  1.  Atypical chest pain.  2.  Hypertension.  3.  History of atrial fibrillation.  CONDITION: Stable.   CODE STATUS: Full code.   HOME MEDICATIONS: Please refer to the medication reconciliation list.   DIET: Low-sodium, low-fat, low-cholesterol diet.   ACTIVITY: As tolerated.   FOLLOWUP CARE: Follow with PCP and Dr. Nehemiah Massed within 1-2 weeks.   HOSPITAL COURSE: The patient is a 74 year old Caucasian female with a history of hypertension, atrial fibrillation, who presented to the ED with chest pain. Also the patient had shortness of breath. For detailed history and physical examination please refer to the admission note dictated by Dr. Fritzi Mandes. On admission date the patient's BUN 12, creatinine 0.74. Electrolytes were normal. WBC 13.2, hemoglobin 14.5. EKG showed atrial fibrillation with  ST depressions 1 mm in the lateral leads, otherwise nonspecific. Chest x-ray showed cardiomegaly with mild vascular congestion, negative for edema. The patient was admitted for atypical chest pain. After admission the patient has been treated with aspirin and Lopressor. The patient got a stress test, according to Dr. Nehemiah Massed the patient and may be discharged to home if the stress test is normal.   The patient has a history of atrial fibrillation, is on aspirin and Lopressor. Heart rate is controlled. The patient has no complaints after admission. Vital signs are stable. Physical examination is unremarkable. The patient is clinically stable. Troponin level has been negative. If stress test is normal the patient will be discharged to home today. I discussed the patient's discharge plan with the patient and the patient's husband, nurse, case manager, and Dr. Saralyn Pilar.   TIME SPENT: About 39 minutes.      ____________________________ Demetrios Loll, MD qc:bu D: 02/13/2014 14:07:12 ET T: 02/13/2014 20:56:22 ET JOB#: [id number]  cc: Demetrios Loll, MD, <Dictator> Demetrios Loll MD ELECTRONICALLY SIGNED 02/14/2014 13:08

## 2014-06-27 NOTE — Consult Note (Signed)
PATIENT NAME:  Glenda Williams, Glenda Williams MR#:  [id number] DATE OF BIRTH:  01/17/1941  DATE OF CONSULTATION:  02/12/2014  REFERRING PHYSICIAN:  Sital P. Benjie Karvonen, MD  CONSULTING PHYSICIAN:  Corey Skains, MD  REASON FOR CONSULTATION: Essential hypertension, paroxysmal atrial fibrillation with left-sided chest burning and concerns with abnormal EKG.   CHIEF COMPLAINT: "I have chest pain."   HISTORY OF PRESENT ILLNESS: This is an elderly female with known history of essential hypertension, paroxysmal atrial fibrillation having new onset of significant burning and chest pain substernal, radiating in the left shoulder, lasting throughout the afternoon with an EKG showing normal sinus rhythm with diffuse inferior ST and T wave changes with a normal troponin. The patient has had some relief with nitroglycerin and oxygen, although not completely relieved. She does feel more comfortable at this time and does not have any malignant hypertension.   REVIEW OF SYSTEMS: Negative for vision change, ringing in the ears, hearing loss, cough, congestion, heartburn, nausea, vomiting, diarrhea, bloody stools, stomach pain, extremity pain, leg weakness, cramping of the buttocks, known blood clots, headaches, blackouts, dizzy spells, nosebleeds, congestion, trouble swallowing, frequent urination, urination at night, muscle weakness, numbness, anxiety, depression, skin lesions, skin rashes.   PAST MEDICAL HISTORY:  1.  Paroxysmal nonvalvular atrial fibrillation.  2.  Essential hypertension.   FAMILY HISTORY: No apparent family members with new onset of cardiovascular disease although hypertension and diabetes are positive.   SOCIAL HISTORY: Currently denies alcohol or tobacco use.   ALLERGIES: As listed.   MEDICATIONS: As listed.   PHYSICAL EXAMINATION:  VITAL SIGNS: Blood pressure is 128/68 bilaterally; heart rate is 78 upright, reclining, and regular.  GENERAL: She is a well-appearing female in no acute distress.   HEENT: No icterus, thyromegaly, ulcers, hemorrhage, or xanthelasma.  CARDIOVASCULAR: Regular rate and rhythm. Normal S1 and S2 without murmur, gallop, or rub. PMI is normal size and placement. Carotid upstroke normal without   Jugular venous pressure is normal.  LUNGS: Clear to auscultation with normal respirations.  ABDOMEN: Soft, nontender without hepatosplenomegaly or masses. Abdominal aorta is normal size without bruit.  EXTREMITIES: Show 2+ radial, femoral, dorsal pedal pulses with no lower extremity edema, cyanosis, clubbing, or ulcers.  NEUROLOGIC: She is oriented to time, place, and person with normal mood and affect.   ASSESSMENT: An elderly female with a history of nonvalvular paroxysmal atrial fibrillation, essential hypertension with chest discomfort, abnormal EKG without current evidence of myocardial infarction.   RECOMMENDATIONS:  1.  Continue serial ECG and enzymes to assess for possible myocardial infarction.  2.  Echocardiogram for LV systolic dysfunction, valvular heart disease contributing to above.  3.  Lexiscan infusion Myoview for further evaluation of myocardial ischemia and coronary artery disease causing above.  4.  Continue on a beta blocker in use nitrates as necessary for anginal symptoms.  5.  Further treatment options after above.    ____________________________ Corey Skains, MD bjk:bm D: 02/12/2014 18:09:54 ET T: 02/13/2014 01:19:55 ET JOB#: [id number]  cc: Corey Skains, MD, <Dictator> Corey Skains MD ELECTRONICALLY SIGNED 02/13/2014 7:55

## 2014-07-02 DIAGNOSIS — G250 Essential tremor: Secondary | ICD-10-CM | POA: Diagnosis not present

## 2014-07-06 DIAGNOSIS — M0579 Rheumatoid arthritis with rheumatoid factor of multiple sites without organ or systems involvement: Secondary | ICD-10-CM | POA: Diagnosis not present

## 2014-07-13 DIAGNOSIS — I480 Paroxysmal atrial fibrillation: Secondary | ICD-10-CM | POA: Diagnosis not present

## 2014-07-13 DIAGNOSIS — F419 Anxiety disorder, unspecified: Secondary | ICD-10-CM | POA: Diagnosis not present

## 2014-07-13 DIAGNOSIS — I10 Essential (primary) hypertension: Secondary | ICD-10-CM | POA: Diagnosis not present

## 2014-07-13 DIAGNOSIS — E039 Hypothyroidism, unspecified: Secondary | ICD-10-CM | POA: Diagnosis not present

## 2014-07-14 DIAGNOSIS — I10 Essential (primary) hypertension: Secondary | ICD-10-CM | POA: Diagnosis not present

## 2014-07-14 DIAGNOSIS — E780 Pure hypercholesterolemia: Secondary | ICD-10-CM | POA: Diagnosis not present

## 2014-07-14 DIAGNOSIS — E039 Hypothyroidism, unspecified: Secondary | ICD-10-CM | POA: Diagnosis not present

## 2014-07-14 DIAGNOSIS — I480 Paroxysmal atrial fibrillation: Secondary | ICD-10-CM | POA: Diagnosis not present

## 2014-07-14 DIAGNOSIS — E1165 Type 2 diabetes mellitus with hyperglycemia: Secondary | ICD-10-CM | POA: Diagnosis not present

## 2014-07-14 LAB — CBC AND DIFFERENTIAL
HEMATOCRIT: 46 % (ref 36–46)
Hemoglobin: 15.3 g/dL (ref 12.0–16.0)
Platelets: 318 10*3/uL (ref 150–399)
WBC: 9.6 10*3/mL

## 2014-07-14 LAB — HEPATIC FUNCTION PANEL
ALT: 18 U/L (ref 7–35)
AST: 22 U/L (ref 13–35)

## 2014-07-14 LAB — LIPID PANEL
CHOLESTEROL: 182 mg/dL (ref 0–200)
HDL: 40 mg/dL (ref 35–70)
LDL Cholesterol: 122 mg/dL
TRIGLYCERIDES: 101 mg/dL (ref 40–160)

## 2014-07-14 LAB — HEMOGLOBIN A1C: Hgb A1c MFr Bld: 6.3 % — AB (ref 4.0–6.0)

## 2014-07-14 LAB — BASIC METABOLIC PANEL
BUN: 11 mg/dL (ref 4–21)
Creatinine: 0.8 mg/dL (ref <=1.1)
GLUCOSE: 101 mg/dL
Potassium: 3.8 mmol/L (ref 3.4–5.3)
Sodium: 139 mmol/L (ref 137–147)

## 2014-07-14 LAB — TSH: TSH: 3.12 u[IU]/mL (ref <=5.90)

## 2014-07-20 DIAGNOSIS — F419 Anxiety disorder, unspecified: Secondary | ICD-10-CM | POA: Diagnosis not present

## 2014-07-20 DIAGNOSIS — E039 Hypothyroidism, unspecified: Secondary | ICD-10-CM | POA: Diagnosis not present

## 2014-07-20 DIAGNOSIS — I480 Paroxysmal atrial fibrillation: Secondary | ICD-10-CM | POA: Diagnosis not present

## 2014-07-20 DIAGNOSIS — I10 Essential (primary) hypertension: Secondary | ICD-10-CM | POA: Diagnosis not present

## 2014-07-30 DIAGNOSIS — E039 Hypothyroidism, unspecified: Secondary | ICD-10-CM | POA: Diagnosis not present

## 2014-07-30 DIAGNOSIS — F419 Anxiety disorder, unspecified: Secondary | ICD-10-CM | POA: Diagnosis not present

## 2014-07-30 DIAGNOSIS — I10 Essential (primary) hypertension: Secondary | ICD-10-CM | POA: Diagnosis not present

## 2014-07-30 DIAGNOSIS — I480 Paroxysmal atrial fibrillation: Secondary | ICD-10-CM | POA: Diagnosis not present

## 2014-08-04 DIAGNOSIS — E559 Vitamin D deficiency, unspecified: Secondary | ICD-10-CM | POA: Insufficient documentation

## 2014-08-04 DIAGNOSIS — M792 Neuralgia and neuritis, unspecified: Secondary | ICD-10-CM | POA: Insufficient documentation

## 2014-08-04 DIAGNOSIS — J309 Allergic rhinitis, unspecified: Secondary | ICD-10-CM | POA: Insufficient documentation

## 2014-08-04 DIAGNOSIS — I4891 Unspecified atrial fibrillation: Secondary | ICD-10-CM | POA: Insufficient documentation

## 2014-08-04 DIAGNOSIS — G4700 Insomnia, unspecified: Secondary | ICD-10-CM | POA: Insufficient documentation

## 2014-08-04 DIAGNOSIS — F419 Anxiety disorder, unspecified: Secondary | ICD-10-CM | POA: Insufficient documentation

## 2014-08-05 ENCOUNTER — Other Ambulatory Visit
Admission: RE | Admit: 2014-08-05 | Discharge: 2014-08-05 | Disposition: A | Discharge status: Home / Self Care | Payer: Medicare Other | Source: Ambulatory Visit | Attending: Cardiology | Admitting: Cardiology

## 2014-08-05 DIAGNOSIS — I480 Paroxysmal atrial fibrillation: Secondary | ICD-10-CM | POA: Diagnosis not present

## 2014-08-05 DIAGNOSIS — R251 Tremor, unspecified: Secondary | ICD-10-CM | POA: Diagnosis not present

## 2014-08-05 DIAGNOSIS — R079 Chest pain, unspecified: Secondary | ICD-10-CM | POA: Diagnosis not present

## 2014-08-05 DIAGNOSIS — I482 Chronic atrial fibrillation: Secondary | ICD-10-CM | POA: Diagnosis not present

## 2014-08-05 LAB — TROPONIN I: Troponin I: <0.03 ng/mL (ref <0.031)

## 2014-08-12 ENCOUNTER — Other Ambulatory Visit: Payer: Self-pay | Admitting: Family Medicine

## 2014-08-12 DIAGNOSIS — E039 Hypothyroidism, unspecified: Secondary | ICD-10-CM

## 2014-08-12 DIAGNOSIS — E876 Hypokalemia: Secondary | ICD-10-CM

## 2014-08-13 ENCOUNTER — Other Ambulatory Visit: Payer: Self-pay

## 2014-08-13 DIAGNOSIS — I10 Essential (primary) hypertension: Secondary | ICD-10-CM

## 2014-08-13 MED ORDER — HYDROCHLOROTHIAZIDE 12.5 MG PO TABS
12.5000 mg | ORAL_TABLET | Freq: Every day | ORAL | Status: DC
Start: 2014-08-13 — End: 2015-03-26

## 2014-08-17 ENCOUNTER — Telehealth: Payer: Self-pay | Admitting: Family Medicine

## 2014-08-17 DIAGNOSIS — M542 Cervicalgia: Secondary | ICD-10-CM | POA: Diagnosis not present

## 2014-08-17 DIAGNOSIS — G252 Other specified forms of tremor: Secondary | ICD-10-CM | POA: Diagnosis not present

## 2014-08-17 DIAGNOSIS — M0579 Rheumatoid arthritis with rheumatoid factor of multiple sites without organ or systems involvement: Secondary | ICD-10-CM | POA: Diagnosis not present

## 2014-08-17 DIAGNOSIS — I482 Chronic atrial fibrillation: Secondary | ICD-10-CM | POA: Diagnosis not present

## 2014-08-17 NOTE — Telephone Encounter (Signed)
Pt states she is going out of town and moved her appointment to 09/04/14.  Pt wanted me to let you know.  WP#[phone]/[id number]

## 2014-08-20 ENCOUNTER — Ambulatory Visit: Payer: Self-pay | Admitting: Family Medicine

## 2014-08-25 ENCOUNTER — Other Ambulatory Visit: Payer: Self-pay | Admitting: Rheumatology

## 2014-08-25 DIAGNOSIS — M542 Cervicalgia: Secondary | ICD-10-CM

## 2014-08-26 ENCOUNTER — Other Ambulatory Visit: Payer: Self-pay | Admitting: Family Medicine

## 2014-08-26 DIAGNOSIS — F419 Anxiety disorder, unspecified: Secondary | ICD-10-CM

## 2014-08-28 DIAGNOSIS — M5022 Other cervical disc displacement, mid-cervical region: Secondary | ICD-10-CM | POA: Diagnosis not present

## 2014-08-31 ENCOUNTER — Ambulatory Visit: Payer: Medicare Other | Attending: Rheumatology | Admitting: Physical Therapy

## 2014-08-31 ENCOUNTER — Encounter: Payer: Self-pay | Admitting: Physical Therapy

## 2014-08-31 DIAGNOSIS — M542 Cervicalgia: Secondary | ICD-10-CM | POA: Insufficient documentation

## 2014-08-31 DIAGNOSIS — M6248 Contracture of muscle, other site: Secondary | ICD-10-CM | POA: Diagnosis not present

## 2014-08-31 DIAGNOSIS — G250 Essential tremor: Secondary | ICD-10-CM | POA: Diagnosis not present

## 2014-08-31 DIAGNOSIS — M62838 Other muscle spasm: Secondary | ICD-10-CM

## 2014-08-31 NOTE — Therapy (Signed)
[place] [facility] [address] [place], [address] Phone: [phone]   Fax:  [phone]  Physical Therapy Evaluation  Patient Details  Name: Glenda Williams MRN: [id number] Date of Birth: 08-May-1940 Referring Provider:  Emmaline Kluver.,*  Encounter Date: 08/31/2014      PT End of Session - 08/31/14 0910    Visit Number 1   Number of Visits 8   Date for PT Re-Evaluation 09/28/14   Authorization Type 1   Authorization Time Period 10   PT Start Time 0800   PT Stop Time 0905   PT Time Calculation (min) 65 min   Activity Tolerance Patient tolerated treatment well   Behavior During Therapy [facility] for tasks assessed/performed      Past Medical History  Diagnosis Date  . GERD (gastroesophageal reflux disease)   . Heart disease   . Thyroid disease   . Arthritis   . Rheumatoid arthritis   . A-fib   . Tremors of nervous system     Past Surgical History  Procedure Laterality Date  . Joint replacement Bilateral     knees  . Carpal tunnel release Bilateral   . Colonoscopy  2006  . Breast biopsy Left 1988  . Cholecystectomy      Dr. Bary Castilla  . Breast biopsy  1987    Benign  . Replacement total knee Left 2005  . Replacement total knee Right 1998    There were no vitals filed for this visit.  Visit Diagnosis:  Cervicalgia - Plan: PT plan of care cert/re-cert  Muscle spasms of head or neck - Plan: PT plan of care cert/re-cert      Subjective Assessment - 08/31/14 0815    Subjective Patient reports she is having pain and symptoms in right upper trapezius into cervical spine. She describes her symptoms as a crawling feeling. Her symptoms are worse with sitting at her computer.    Pertinent History Patient reports that she began exercising 2-3 weeks ago in physical therapy and was beginning to exercise on the [employer] and that evening she went home and began having neck symptoms.    Limitations Other  (comment);Sitting  sitting at computer and repetition with right UE   How long can you sit comfortably? 30   How long can you stand comfortably? as long as she would like   Diagnostic tests MRI 08/21/2014: waiting on results   Currently in Pain? Yes   Pain Score 4   Pain/symptoms range from a 0/10 up to 8/10   Pain Location Neck   Pain Orientation Right   Pain Descriptors / Indicators Spasm;Tingling;Aching;Numbness   Pain Type Acute pain   Pain Radiating Towards right UE   Pain Onset 1 to 4 weeks ago   Aggravating Factors  sitting, computer work   Pain Relieving Factors getting up/moving   Effect of Pain on Daily Activities unable    Multiple Pain Sites No            [facility] PT Assessment - 08/31/14 0826    Assessment   Medical Diagnosis M54.2, G89.29: Chronic neck pain   Onset Date/Surgical Date 08/10/14   Hand Dominance Right   Next MD Visit unknown   Prior Therapy none   Precautions   Precautions None   Restrictions   Weight Bearing Restrictions No   Balance Screen   Has the patient fallen in the past 6 months No   Has the patient had  a decrease in activity level because of a fear of falling?  No   Is the patient reluctant to leave their home because of a fear of falling?  No   Home Ecologist residence   Living Arrangements Spouse/significant other   Home Access Other (comment)  one 4" step to enter   Prior Function   Level of Independence Independent   Cognition   Overall Cognitive Status Within Functional Limits for tasks assessed   Attention Focused   Memory Appears intact   Observation/Other Assessments   Observations mild forward head posture, increased kyphosis at transition of cervical/thoracic spine   AROM   Overall AROM Comments cervical spine AROM: rotation: right  55, left 60, lateral flexion left 30, right 40, forward flexion 55, extension 55    Flexibility   Soft Tissue Assessment /Muscle Length + increased spasms  along right side upper trapezius and along lower cervical spine/upper thoracic spine right side   Special Tests    Special Tests Cervical   Cervical Tests Spurling's;Distraction;other  compression   Spurling's   Findings Negative   Side Right   Comment negative both sides   Distraction Test   Findngs Negative   side Right   Comment improved symptoms with decreased pain into right upper trapezius      Treatment: Ultrasound: 1MHz pulsed @ 50%, 1.4 w/cm2 to right upper trapezius into right side cervical spine musculature with patient seated Therapeutic exercises: instructed in chin tucks and scapular adduction in sitting, 10 reps each, (every hour to perform 5 reps at home), with repetition and verbal cuing to perform correctly Ice pack was applied to cervical spine at end of treatment with patient seated (10 min.)  Patient response to treatment: decreased symptoms in right shoulder/neck region to 0/10 and patient was able to return demonstration of home exercises with verbal cuing          PT Education - 08/31/14 0843    Education provided Yes   Education Details educated in proper posture/positioning for sleep with cervical roll under neck, sitting posture with good ergonomics, correct alignment of head over shoulders to decrease forward head posture   Person(s) Educated Patient   Methods Explanation;Demonstration;Other (comment)  visual cues/picture   Comprehension Verbalized understanding;Returned demonstration;Verbal cues required             PT Long Term Goals - 08/31/14 0900    PT LONG TERM GOAL #1   Title Patient will report decreased pain level to <2/10 with prolonged sitting at computer (>30 min.) with good posture by 09/28/2014   Baseline pain level 7/10   Status New   PT LONG TERM GOAL #2   Title Patient will demonstrate imrpoved self perceived disability due to neck symptoms with improved NDI of 25% or less by 7/252016   Baseline NDI = 38%   Status New    PT LONG TERM GOAL #3   Title Patient will be independent with self managment of symptoms and posture awareness with home exercises without cuing in order return to prior level of function by 09/28/2014   Baseline limited/no knowledge of appropriate pain control strategies, posture awareness/exercises   Status New               Plan - 08/31/14 0905    Clinical Impression Statement Patient is a 74 year old right hand dominant female who presents with acute pain in cervical spine with spasms in right upper trapezius and cervical spine  musculature with intermittent symptoms into right UE. She has limited AROM rotation right 55, lateral flexion left 30 with pulling sensation in right upper trapezius. and along right side C7/T1 region. She responded favorablly to treatment for reduction of spasms and general posture correction/exericses. She should respond well to physical therapy intervention in order to return to prior level of function without cervical pain/right UE symptoms. Her current impairment level based on NDI, pain scale and clinical judgment is 35%. She is expected to achieve less than 20% impairment following 4 weeks of therapy.    Pt will benefit from skilled therapeutic intervention in order to improve on the following deficits Increased muscle spasms;Postural dysfunction;Impaired flexibility;Decreased range of motion;Pain   Rehab Potential Good   PT Frequency 2x / week   PT Duration 4 weeks   PT Treatment/Interventions Manual techniques;Cryotherapy;Electrical Stimulation;Therapeutic exercise;Moist Heat;Ultrasound;Patient/family education   PT Next Visit Plan pain control, reduction of spasms, progressive exercise, manual techniques   PT Home Exercise Plan posture correction, sleep positioning, exercises: chin tucks, scapular adduction with controlled movement   Consulted and Agree with Plan of Care Patient          G-Codes - Sep 25, 2014 1927    Functional Assessment Tool Used NDI,  pain scale, ROM, clinical judgment   Functional Limitation Changing and maintaining body position   Changing and Maintaining Body Position Current Status (Z1245) At least 20 percent but less than 40 percent impaired, limited or restricted   Changing and Maintaining Body Position Goal Status (Y0998) At least 1 percent but less than 20 percent impaired, limited or restricted       Problem List Patient Active Problem List   Diagnosis Date Noted  . Hypokalemia 08/12/2014  . Allergic rhinitis 08/04/2014  . Anxiety 08/04/2014  . Cannot sleep 08/04/2014  . Neuropathic pain 08/04/2014  . AF (paroxysmal atrial fibrillation) 08/04/2014  . Avitaminosis D 08/04/2014  . Gastroesophageal reflux disease without esophagitis 11/07/2013  . A-fib 08/29/2013  . Arthritis, degenerative 06/11/2013  . Hypercholesteremia 05/12/2009  . Benign essential tremor 05/12/2009  . Acid reflux 11/14/2008  . Arthritis or polyarthritis, rheumatoid 08/26/2008  . Diabetes mellitus type 2, uncontrolled 08/26/2008  . BP (high blood pressure) 08/26/2008  . Adult hypothyroidism 08/26/2008  . Menopausal symptom 08/26/2008    Jomarie Longs PT 25-Sep-2014, 7:33 PM  [place] [facility] [address], [place], [address] Phone: [phone]   Fax:  [phone]

## 2014-09-01 ENCOUNTER — Other Ambulatory Visit: Payer: Self-pay | Admitting: Family Medicine

## 2014-09-02 ENCOUNTER — Encounter: Payer: Self-pay | Admitting: Physical Therapy

## 2014-09-02 ENCOUNTER — Ambulatory Visit: Payer: Medicare Other | Admitting: Physical Therapy

## 2014-09-02 DIAGNOSIS — F419 Anxiety disorder, unspecified: Secondary | ICD-10-CM | POA: Diagnosis not present

## 2014-09-02 DIAGNOSIS — M542 Cervicalgia: Secondary | ICD-10-CM

## 2014-09-02 DIAGNOSIS — M6248 Contracture of muscle, other site: Secondary | ICD-10-CM | POA: Diagnosis not present

## 2014-09-02 DIAGNOSIS — M62838 Other muscle spasm: Secondary | ICD-10-CM

## 2014-09-02 DIAGNOSIS — I10 Essential (primary) hypertension: Secondary | ICD-10-CM | POA: Diagnosis not present

## 2014-09-02 DIAGNOSIS — I480 Paroxysmal atrial fibrillation: Secondary | ICD-10-CM | POA: Diagnosis not present

## 2014-09-02 DIAGNOSIS — R251 Tremor, unspecified: Secondary | ICD-10-CM | POA: Diagnosis not present

## 2014-09-02 NOTE — Therapy (Signed)
[place] [facility] [address] [place], [address] Phone: [phone]   Fax:  [phone]  Physical Therapy Treatment  Patient Details  Name: Glenda Williams MRN: [id number] Date of Birth: [birth year] Referring Provider:  Emmaline Kluver.,*  Encounter Date: 09/02/2014      PT End of Session - 09/02/14 0945    Visit Number 2   Number of Visits 8   Date for PT Re-Evaluation 09/28/14   Authorization Type 2   Authorization Time Period 10   PT Start Time 0900   PT Stop Time 0940   PT Time Calculation (min) 40 min   Activity Tolerance Patient tolerated treatment well   Behavior During Therapy [facility] for tasks assessed/performed      Past Medical History  Diagnosis Date  . GERD (gastroesophageal reflux disease)   . Heart disease   . Thyroid disease   . Arthritis   . Rheumatoid arthritis   . A-fib   . Tremors of nervous system     Past Surgical History  Procedure Laterality Date  . Joint replacement Bilateral     knees  . Carpal tunnel release Bilateral   . Colonoscopy  2006  . Breast biopsy Left 1988  . Cholecystectomy      Dr. Bary Castilla  . Breast biopsy  1987    Benign  . Replacement total knee Left 2005  . Replacement total knee Right 1998    There were no vitals filed for this visit.  Visit Diagnosis:  Cervicalgia  Muscle spasms of head or neck      Subjective Assessment - 09/02/14 0904    Subjective Patient reports she is improving with less pain and symptoms of crawling into right shoulder neck region. She is working on exercises as instructed and trying to watch her posture. She is using neck roll for sleeping with good results.    Limitations Sitting;Other (comment)  sitting at computer   Diagnostic tests MRI 08/21/2014: waiting on results: patient reports she was told she had a slight herniated disc on right that did not pinch the nerve   Currently in Pain? Yes   Pain Score 3    Pain  Location Neck   Pain Orientation Right   Pain Descriptors / Indicators Tingling;Aching   Pain Type Acute pain   Pain Onset 1 to 4 weeks ago   Multiple Pain Sites No                [facility] Adult PT Treatment/Exercise - 09/02/14 0958    Exercises   Exercises Other Exercises   Other Exercises  instructed in self stretch for upper thoracic spine extension and isometric cervical spine flexion for deep flexors in sitting with demonstrationa and verbal cuing    Modalities   Modalities Ultrasound;Cryotherapy   Cryotherapy   Number Minutes Cryotherapy 15 Minutes   Cryotherapy Location Cervical   Type of Cryotherapy Ice pack   Ultrasound   Ultrasound Location right cervical spine and upper trapezius   Ultrasound Parameters 1MHz pulsed @ 50% 1.4w/cm2 x 10 min. with patient sitting   Ultrasound Goals Pain;Other (Comment)  muscle spasms   Manual Therapy   Manual Therapy Manual Traction;Soft tissue mobilization;Joint mobilization   Manual therapy comments with patient seated   Joint Mobilization lower cervical spine into thoracic spine: grade 2-3 PA mobilization with patient seated with thoracic spine extension   Soft tissue mobilization right upper trapezius into upper thoracic spine paraspinal  muscles superficial technques to improve soft tissue elasticity   Manual Traction cervical spine traction with mild distraction in sitting 5 reps 5-10 second holds     Patient response to treatment: improved pain level to 0/10 and improved soft tissue elasticity , able to perform exercises with verbal cuing and following demonstration and repetition           PT Education - 09/02/14 0940    Education provided Yes   Education Details reinforced proper posture and positioning for sleep with cervical roll and correcting posture forward head posture in sitting and standing   Person(s) Educated Patient   Methods Explanation;Demonstration;Verbal cues   Comprehension Verbalized  understanding;Returned demonstration;Verbal cues required             PT Long Term Goals - 08/31/14 0900    PT LONG TERM GOAL #1   Title Patient will report decreased pain level to <2/10 with prolonged sitting at computer (>30 min.) with good posture by 09/28/2014   Baseline pain level 7/10   Status New   PT LONG TERM GOAL #2   Title Patient will demonstrate imrpoved self perceived disability due to neck symptoms with improved NDI of 25% or less by 7/252016   Baseline NDI = 38%   Status New   PT LONG TERM GOAL #3   Title Patient will be independent with self managment of symptoms and posture awareness with home exercises without cuing in order return to prior level of function by 09/28/2014   Baseline limited/no knowledge of appropriate pain control strategies, posture awareness/exercises   Status New               Plan - 09/02/14 0945    Clinical Impression Statement Patient is progressing with decresaed symptoms into right upper trapezius and side of cervical spine C6-C7, T1 region. She demonstrated decreased spasms and decresaed pain/spasms to 0/10 at end of session. She is improving knowledge of posture correction and exercises to perfrom at home. She is going to try using a soft cervical collar for long driving and prolonged sitting to assist with forward head correction.    Pt will benefit from skilled therapeutic intervention in order to improve on the following deficits Increased muscle spasms;Postural dysfunction;Impaired flexibility;Decreased range of motion;Pain   Rehab Potential Good   PT Frequency 2x / week   PT Duration 4 weeks   PT Treatment/Interventions Manual techniques;Cryotherapy;Electrical Stimulation;Therapeutic exercise;Moist Heat;Ultrasound;Patient/family education   PT Home Exercise Plan posture correction, sleep positioning, exercises: chin tucks, scapular adduction with controlled movement        Problem List Patient Active Problem List    Diagnosis Date Noted  . Hypokalemia 08/12/2014  . Allergic rhinitis 08/04/2014  . Anxiety 08/04/2014  . Cannot sleep 08/04/2014  . Neuropathic pain 08/04/2014  . AF (paroxysmal atrial fibrillation) 08/04/2014  . Avitaminosis D 08/04/2014  . Gastroesophageal reflux disease without esophagitis 11/07/2013  . A-fib 08/29/2013  . Arthritis, degenerative 06/11/2013  . Hypercholesteremia 05/12/2009  . Benign essential tremor 05/12/2009  . Acid reflux 11/14/2008  . Arthritis or polyarthritis, rheumatoid 08/26/2008  . Diabetes mellitus type 2, uncontrolled 08/26/2008  . BP (high blood pressure) 08/26/2008  . Adult hypothyroidism 08/26/2008  . Menopausal symptom 08/26/2008    Jomarie Longs PT 09/02/2014, 3:00 PM  [place] [facility] [address], [place], [address] Phone: [phone]   Fax:  [phone]

## 2014-09-04 ENCOUNTER — Ambulatory Visit (INDEPENDENT_AMBULATORY_CARE_PROVIDER_SITE_OTHER): Payer: Medicare Other | Admitting: Family Medicine

## 2014-09-04 ENCOUNTER — Encounter: Payer: Self-pay | Admitting: Family Medicine

## 2014-09-04 VITALS — BP 128/80 | HR 64 | Temp 97.9°F | Resp 16 | Ht 64.0 in | Wt 239.0 lb

## 2014-09-04 DIAGNOSIS — F419 Anxiety disorder, unspecified: Secondary | ICD-10-CM

## 2014-09-04 DIAGNOSIS — G510 Bell's palsy: Secondary | ICD-10-CM

## 2014-09-04 DIAGNOSIS — I10 Essential (primary) hypertension: Secondary | ICD-10-CM | POA: Diagnosis not present

## 2014-09-04 DIAGNOSIS — I480 Paroxysmal atrial fibrillation: Secondary | ICD-10-CM | POA: Diagnosis not present

## 2014-09-04 DIAGNOSIS — E1165 Type 2 diabetes mellitus with hyperglycemia: Secondary | ICD-10-CM

## 2014-09-04 DIAGNOSIS — IMO0002 Reserved for concepts with insufficient information to code with codable children: Secondary | ICD-10-CM

## 2014-09-04 DIAGNOSIS — G250 Essential tremor: Secondary | ICD-10-CM | POA: Diagnosis not present

## 2014-09-04 HISTORY — DX: Bell's palsy: G51.0

## 2014-09-04 MED ORDER — SERTRALINE HCL 50 MG PO TABS: 150.0000 mg | ORAL_TABLET | Freq: Every day | ORAL | Status: DC

## 2014-09-04 NOTE — Progress Notes (Signed)
Subjective:    Patient ID: Glenda Williams, female    DOB: 05/09/1940, 74 y.o.   MRN: [id number]  Anxiety Presents for follow-up visit. The problem has been gradually improving. Symptoms include dry mouth, excessive worry (improving), malaise (improving), nausea, nervous/anxious behavior and panic (improving). Patient reports no chest pain, compulsions, confusion, decreased concentration, depressed mood, dizziness, feeling of choking, hyperventilation, insomnia, irritability, muscle tension, obsessions (hyperte), palpitations, restlessness, shortness of breath or suicidal ideas.   Past treatments include SSRIs. The treatment provided significant relief. Compliance with prior treatments has been good.  Hypertension This is a chronic problem. Associated symptoms include anxiety, neck pain (secondary to injury; recently had MRI) and sweats. Pertinent negatives include no blurred vision, chest pain, headaches, malaise/fatigue, orthopnea, palpitations, peripheral edema or shortness of breath. Treatments tried: Metoprolol and HCTX.  LOV 07/30/2014. Changes management made at LOV include increasing Zoloft to 100mg  po qd.  Did see Dr. Ubaldo Glassing this week. Was in a fib by EKG.  Started on Xarelto.   Did increase her Mysoline per neurology.     Review of Systems  Constitutional: Negative for malaise/fatigue and irritability.  Eyes: Negative for blurred vision.  Respiratory: Negative for shortness of breath.   Cardiovascular: Negative for chest pain, palpitations and orthopnea.  Gastrointestinal: Positive for nausea.  Musculoskeletal: Positive for neck pain (secondary to injury; recently had MRI).  Neurological: Negative for dizziness and headaches.  Psychiatric/Behavioral: Negative for suicidal ideas, confusion and decreased concentration. The patient is nervous/anxious. The patient does not have insomnia.    BP 128/80 mmHg  Pulse 64  Temp(Src) 97.9 F (36.6 C) (Oral)  Resp 16  Ht 5\' 4"  (1.626 m)   Wt 239 lb (108.41 kg)  BMI 41.00 kg/m2   Patient Active Problem List   Diagnosis Date Noted  . Bell palsy 09/04/2014  . Adiposity 09/04/2014  . Hypokalemia 08/12/2014  . Allergic rhinitis 08/04/2014  . Anxiety 08/04/2014  . Cannot sleep 08/04/2014  . Neuropathic pain 08/04/2014  . AF (paroxysmal atrial fibrillation) 08/04/2014  . Avitaminosis D 08/04/2014  . Gastroesophageal reflux disease without esophagitis 11/07/2013  . A-fib 08/29/2013  . Polypharmacy 08/19/2013  . Arthritis, degenerative 06/11/2013  . Hypercholesteremia 05/12/2009  . Benign essential tremor 05/12/2009  . Acid reflux 11/14/2008  . Arthritis or polyarthritis, rheumatoid 08/26/2008  . Diabetes mellitus type 2, uncontrolled 08/26/2008  . BP (high blood pressure) 08/26/2008  . Adult hypothyroidism 08/26/2008  . Menopausal symptom 08/26/2008   Past Medical History  Diagnosis Date  . GERD (gastroesophageal reflux disease)   . Heart disease   . Thyroid disease   . Arthritis   . Rheumatoid arthritis   . A-fib   . Tremors of nervous system    Current Outpatient Prescriptions on File Prior to Visit  Medication Sig  . cetirizine (ZYRTEC) 10 MG tablet Take by mouth.  . cholecalciferol (VITAMIN D) 1000 UNITS tablet Take 1,000 Units by mouth daily.  . folic acid (FOLVITE) 1 MG tablet Take 1 mg by mouth daily.  . hydrochlorothiazide (HYDRODIURIL) 12.5 MG tablet Take 1 tablet (12.5 mg total) by mouth daily.  . InFLIXimab (REMICADE IV) Inject into the vein. Every 6-8 weeks  . levothyroxine (SYNTHROID, LEVOTHROID) 75 MCG tablet Take 1 tablet by mouth  daily  . LORazepam (ATIVAN) 0.5 MG tablet take 1 to 2 tablets by mouth twice a day if needed anxiety  . methotrexate (RHEUMATREX) 2.5 MG tablet 8 tabs by mouth weekly  . metoprolol tartrate (LOPRESSOR) 25 MG tablet  Take 25 mg by mouth 2 (two) times daily.   . Omega-3 Fatty Acids (FISH OIL) 1000 MG CAPS Take 1 capsule by mouth daily.  Marland Kitchen omeprazole (PRILOSEC) 20 MG  capsule Take 20 mg by mouth 2 (two) times daily before a meal.   . potassium chloride SA (K-DUR,KLOR-CON) 20 MEQ tablet Take 1 tablet by mouth  daily  . primidone (MYSOLINE) 50 MG tablet Take 150 mg by mouth at bedtime.   . Red Yeast Rice 600 MG CAPS Take 1 capsule by mouth daily.  . sertraline (ZOLOFT) 50 MG tablet Take by mouth.  Marland Kitchen tiZANidine (ZANAFLEX) 4 MG tablet Take 4 mg by mouth 3 (three) times daily.   No current facility-administered medications on file prior to visit.   Allergies  Allergen Reactions  . Penicillins Rash   Past Surgical History  Procedure Laterality Date  . Joint replacement Bilateral     knees  . Carpal tunnel release Bilateral   . Colonoscopy  2006  . Breast biopsy Left 1988  . Cholecystectomy      Dr. Bary Castilla  . Breast biopsy  1987    Benign  . Replacement total knee Left 2005  . Replacement total knee Right 1998   History   Social History  . Marital Status: Married    Spouse Name: N/A  . Number of Children: N/A  . Years of Education: N/A   Occupational History  . Not on file.   Social History Main Topics  . Smoking status: Never Smoker   . Smokeless tobacco: Never Used  . Alcohol Use: No  . Drug Use: No  . Sexual Activity: Not on file   Other Topics Concern  . Not on file   Social History Narrative   Family History  Problem Relation Age of Onset  . Stroke Mother   . Hypertension Mother   . Heart disease Father   . Arthritis Father   . Parkinson's disease Sister   . Parkinson's disease Brother       Objective:   Physical Exam  Constitutional: She is oriented to person, place, and time. She appears well-developed and well-nourished.  Cardiovascular: Normal rate.  An irregularly irregular rhythm present.  Neurological: She is alert and oriented to person, place, and time.  Psychiatric: She has a normal mood and affect. Her behavior is normal. Judgment and thought content normal.     BP 128/80 mmHg  Pulse 64  Temp(Src)  97.9 F (36.6 C) (Oral)  Resp 16  Ht 5\' 4"  (1.626 m)  Wt 239 lb (108.41 kg)  BMI 41.00 kg/m2         Assessment & Plan:   1. AF (paroxysmal atrial fibrillation)  Stable. Is currently in atrial fibrillation. Continue Xarelto and follow up with cardiology in one month.   2. Essential hypertension  Stable on current medication.  Rate controlled.   3. Anxiety  Improved. Not a goal. Will increase Zoloft and recheck in 6 weeks.   4. Benign Essential Tremor-  Followed up with neurology. Has increased Mysoline.   5. NIDDM-   Stable. Recheck in 6 weeks.   Margarita Rana, MD

## 2014-09-08 ENCOUNTER — Ambulatory Visit: Payer: Medicare Other | Admitting: Physical Therapy

## 2014-09-09 ENCOUNTER — Ambulatory Visit: Payer: Medicare Other | Attending: Rheumatology | Admitting: Physical Therapy

## 2014-09-09 ENCOUNTER — Encounter: Payer: Self-pay | Admitting: Physical Therapy

## 2014-09-09 DIAGNOSIS — M62838 Other muscle spasm: Secondary | ICD-10-CM

## 2014-09-09 DIAGNOSIS — M6248 Contracture of muscle, other site: Secondary | ICD-10-CM | POA: Insufficient documentation

## 2014-09-09 DIAGNOSIS — M542 Cervicalgia: Secondary | ICD-10-CM | POA: Insufficient documentation

## 2014-09-10 ENCOUNTER — Ambulatory Visit: Payer: Medicare Other | Admitting: Physical Therapy

## 2014-09-10 ENCOUNTER — Other Ambulatory Visit: Payer: Medicare Other

## 2014-09-10 NOTE — Therapy (Signed)
[facility] [address] [place], [address] Phone: [phone]   Fax:  [phone]  Physical Therapy Treatment  Patient Details  Name: Glenda Williams MRN: [id number] Date of Birth: [birth year] Referring Provider:  Emmaline Kluver.,*  Encounter Date: 09/09/2014      PT End of Session - 09/09/14 0915    Visit Number 3   Number of Visits 8   Authorization Type 3   Authorization Time Period 10   PT Start Time 0830   PT Stop Time 0915   PT Time Calculation (min) 45 min   Activity Tolerance Patient tolerated treatment well   Behavior During Therapy [facility] for tasks assessed/performed      Past Medical History  Diagnosis Date  . GERD (gastroesophageal reflux disease)   . Heart disease   . Thyroid disease   . Arthritis   . Rheumatoid arthritis   . A-fib   . Tremors of nervous system     Past Surgical History  Procedure Laterality Date  . Joint replacement Bilateral     knees  . Carpal tunnel release Bilateral   . Colonoscopy  2006  . Breast biopsy Left 1988  . Cholecystectomy      Dr. Bary Castilla  . Breast biopsy  1987    Benign  . Replacement total knee Left 2005  . Replacement total knee Right 1998    There were no vitals filed for this visit.  Visit Diagnosis:  Cervicalgia  Muscle spasms of head or neck      Subjective Assessment - 09/09/14 0841    Subjective Patient reports she is improving with less pain and symptoms of crawling into right shoulder neck region. She is working on exercises as instructed and trying to watch her posture. She did get a soft collar and would like to reveiw how to where   Diagnostic tests MRI results written report:    Currently in Pain? Yes   Pain Score 2    Pain Location Neck   Pain Orientation Right   Pain Descriptors / Indicators Aching;Tingling   Pain Type Acute pain   Pain Radiating Towards right UE   Pain Onset More than a month ago   Aggravating Factors  sitting, computer work   Multiple Pain Sites No                         [facility] Adult PT Treatment/Exercise - 09/09/14 0846    Exercises   Exercises Other Exercises   Other Exercises  Re assessed self stretch for upper thoracic spine extension and isometric cervical spine flexion for deep flexors in sitting with demonstrationa and verbal cuing    Modalities   Modalities Ultrasound;Cryotherapy;Electrical Stimulation   Cryotherapy   Cryotherapy Location Cervical x 15 min.    Ultrasound   Ultrasound parameters and Goals    Electrical stimulation: parameters and goals 1MHz pulsed @ 50% 1.4 w/cm2 x 10 min. Cervical spine right side into upper trapezius muscle with patient seated Pain;Other (Comment)  muscle spasms High volt continuous mode applied to upper trapezius muscles and along mid thoracic spine with patient seated with ice pack applied to same     Manual Therapy   Manual Therapy Manual Traction;Soft tissue mobilization;Joint mobilization   Manual therapy comments with patient seated       Soft tissue mobilization right upper trapezius into upper thoracic spine paraspinal muscles superficial technques to improve  soft tissue elasticity   Manual Traction cervical spine traction in sitting 5 reps 5-10 second holds     Patient response to treatment: decreased spasm to mild, decreased symptoms into right UE/upper trapezius to 0/10, good demonstration of exercises with minimal verbal cuing        PT Education - 09/09/14 0841    Education provided Yes   Education Details Reinforced proper posture and re assessed exercises for correcting posture during the day to prevent exacerbation of symptoms   Person(s) Educated Patient   Methods Explanation;Demonstration   Comprehension Verbalized understanding;Returned demonstration             PT Long Term Goals - 08/31/14 0900    PT LONG TERM GOAL #1   Title Patient will report decreased pain  level to <2/10 with prolonged sitting at computer (>30 min.) with good posture by 09/28/2014   Baseline pain level 7/10   Status New   PT LONG TERM GOAL #2   Title Patient will demonstrate imrpoved self perceived disability due to neck symptoms with improved NDI of 25% or less by 7/252016   Baseline NDI = 38%   Status New   PT LONG TERM GOAL #3   Title Patient will be independent with self managment of symptoms and posture awareness with home exercises without cuing in order return to prior level of function by 09/28/2014   Baseline limited/no knowledge of appropriate pain control strategies, posture awareness/exercises   Status New               Plan - 09/09/14 0916    Clinical Impression Statement Patient is progressing with improvement noted in right UE symptoms and decresaed tenderness in right side upper trapezius/cervical spine. She is progressing well towards goals and wil benefit from continued physical therapy intervention to further decrease pain and spasms and return to prior level of function.    Pt will benefit from skilled therapeutic intervention in order to improve on the following deficits Increased muscle spasms;Postural dysfunction;Impaired flexibility;Decreased range of motion;Pain   Rehab Potential Good   PT Frequency 2x / week   PT Duration 4 weeks   PT Treatment/Interventions Manual techniques;Cryotherapy;Electrical Stimulation;Therapeutic exercise;Moist Heat;Ultrasound;Patient/family education   PT Next Visit Plan pain control, reduction of spasms, progressive exercise, manual techniques        Problem List Patient Active Problem List   Diagnosis Date Noted  . Bell palsy 09/04/2014  . Adiposity 09/04/2014  . Hypertension 09/04/2014  . Hypokalemia 08/12/2014  . Allergic rhinitis 08/04/2014  . Anxiety 08/04/2014  . Cannot sleep 08/04/2014  . Neuropathic pain 08/04/2014  . AF (paroxysmal atrial fibrillation) 08/04/2014  . Avitaminosis D 08/04/2014  .  Gastroesophageal reflux disease without esophagitis 11/07/2013  . Polypharmacy 08/19/2013  . Arthritis, degenerative 06/11/2013  . Hypercholesteremia 05/12/2009  . Benign essential tremor 05/12/2009  . Acid reflux 11/14/2008  . Arthritis or polyarthritis, rheumatoid 08/26/2008  . Diabetes mellitus type 2, uncontrolled 08/26/2008  . Adult hypothyroidism 08/26/2008  . Menopausal symptom 08/26/2008    Jomarie Longs PT 09/10/2014, 8:07 AM  [facility] [address] [place], [address] Phone: [phone]   Fax:  [phone]

## 2014-09-15 ENCOUNTER — Ambulatory Visit: Payer: Medicare Other | Admitting: Physical Therapy

## 2014-09-15 ENCOUNTER — Encounter: Payer: Self-pay | Admitting: Physical Therapy

## 2014-09-15 DIAGNOSIS — M542 Cervicalgia: Secondary | ICD-10-CM | POA: Diagnosis not present

## 2014-09-15 DIAGNOSIS — M6248 Contracture of muscle, other site: Secondary | ICD-10-CM | POA: Diagnosis not present

## 2014-09-15 DIAGNOSIS — M62838 Other muscle spasm: Secondary | ICD-10-CM

## 2014-09-15 NOTE — Therapy (Signed)
[facility] [address], [place], [address] Phone: [phone]   Fax:  [phone]  Physical Therapy Treatment  Patient Details  Name: Glenda Williams MRN: [id number] Date of Birth: 10-Oct-1940 Referring Provider:  Emmaline Kluver.,*  Encounter Date: 09/15/2014      PT End of Session - 09/15/14 1624    Visit Number 4   Number of Visits 8   Date for PT Re-Evaluation 09/28/14   Authorization Type 4   Authorization Time Period 10   PT Start Time 1534   PT Stop Time 1630   PT Time Calculation (min) 56 min   Activity Tolerance Patient tolerated treatment well   Behavior During Therapy [facility] for tasks assessed/performed      Past Medical History  Diagnosis Date  . GERD (gastroesophageal reflux disease)   . Heart disease   . Thyroid disease   . Arthritis   . Rheumatoid arthritis   . A-fib   . Tremors of nervous system     Past Surgical History  Procedure Laterality Date  . Joint replacement Bilateral     knees  . Carpal tunnel release Bilateral   . Colonoscopy  2006  . Breast biopsy Left 1988  . Cholecystectomy      Dr. Bary Castilla  . Breast biopsy  1987    Benign  . Replacement total knee Left 2005  . Replacement total knee Right 1998    There were no vitals filed for this visit.  Visit Diagnosis:  Cervicalgia  Muscle spasms of head or neck      Subjective Assessment - 09/15/14 1538    Subjective Patient reports she is sleeping better and has not had any tingling into her hand today or on arrival. She is overall improving with posture awareness and therapy. She is still not sure of her exercises and would like to review to be sure she is doing them correctly. She has been using soft cervical collar for driving so she doesn't have right arm symptoms and she is using towel roll at home for sitting/sleeping to keep symptoms out of her arm/hand.    Currently in Pain? No/denies      Objective: Posture: moderate forward head posture with increased thoracic kyphosis upper back Palpation: + mild spasms with tenderness along right side cervical spine into upper trapezius muscles        [facility] Adult PT Treatment/Exercise - 09/15/14 1617    Exercises   Exercises Other Exercises   Other Exercises  instructed in posture against wall with chin tucks and UE abduction through partial ROM as tolerated, re assessed exercises for thoracic spine mobilization and upper trapezius stretch    Modalities   Modalities Ultrasound;Cryotherapy;Electrical Stimulation   Cryotherapy   Number Minutes Cryotherapy 20 Minutes   Cryotherapy Location Cervical   Type of Cryotherapy Ice pack    Electrical Stimulation   Electrical Stimulation Location cervical spine/upper and mid trapezius muscles   Electrical Stimulation Parameters high volt estim. (4) electrodes applied to both upper and mid trapezius muscles with patient seated in chair with back and UE's supported with ice pack applied to cervical spine    Electrical Stimulation Goals Pain  reduction of muscle spasms   Ultrasound   Ultrasound Location cervical spine and trapezius muscle on right   Ultrasound Parameters 1 MHz pulsed @ 50% 1.4 w/cm2 x 10 min with pateint sitting    Ultrasound Goals Pain;Other (Comment)  muscle spasms  Manual Therapy   Manual Therapy Manual Traction;Soft tissue mobilization;Joint mobilization   Manual therapy comments with patient seated   Joint Mobilization lower cervical spine into thoracic spine: grade 2-3 PA mobilization with patient seated with thoracic spine extension       Manual Traction cervical spine traction in sitting 5 reps 5-10 second holds      Patient response to treatment: improving ability to self manage symptoms with improving posture awareness, able to perform exercises with minimal cuing and following demonstration, decreased spasms in right upper trapezius muscle following US/estim. And  manual therapy techniques          PT Education - 09/15/14 1622    Education provided Yes   Education Details Re instruceted in posture awareness and importance of observing her posture in sitting and exercises to be done daily, added posture correction with shoulder abduction at wall    Person(s) Educated Patient   Methods Explanation;Demonstration   Comprehension Verbalized understanding;Returned demonstration             PT Long Term Goals - 08/31/14 0900    PT LONG TERM GOAL #1   Title Patient will report decreased pain level to <2/10 with prolonged sitting at computer (>30 min.) with good posture by 09/28/2014   Baseline pain level 7/10   Status New   PT LONG TERM GOAL #2   Title Patient will demonstrate imrpoved self perceived disability due to neck symptoms with improved NDI of 25% or less by 7/252016   Baseline NDI = 38%   Status New   PT LONG TERM GOAL #3   Title Patient will be independent with self managment of symptoms and posture awareness with home exercises without cuing in order return to prior level of function by 09/28/2014   Baseline limited/no knowledge of appropriate pain control strategies, posture awareness/exercises   Status New               Plan - 09/15/14 1628    Clinical Impression Statement Patient is progressing well with decreased symptoms in right UE and able to sleep without difficulty now. She conitnues with intermittent symptoms and will benefit from continued physical therapy intervention to achieve goals and return to prior level of funciotn with self management of symptoms.    Pt will benefit from skilled therapeutic intervention in order to improve on the following deficits Increased muscle spasms;Postural dysfunction;Impaired flexibility;Decreased range of motion;Pain   Rehab Potential Good   PT Frequency 2x / week   PT Duration 4 weeks   PT Treatment/Interventions Manual techniques;Cryotherapy;Electrical Stimulation;Therapeutic  exercise;Moist Heat;Ultrasound;Patient/family education   PT Next Visit Plan pain control, reduction of spasms, progressive exercise, manual techniques        Problem List Patient Active Problem List   Diagnosis Date Noted  . Bell palsy 09/04/2014  . Adiposity 09/04/2014  . Hypertension 09/04/2014  . Hypokalemia 08/12/2014  . Allergic rhinitis 08/04/2014  . Anxiety 08/04/2014  . Cannot sleep 08/04/2014  . Neuropathic pain 08/04/2014  . AF (paroxysmal atrial fibrillation) 08/04/2014  . Avitaminosis D 08/04/2014  . Gastroesophageal reflux disease without esophagitis 11/07/2013  . Polypharmacy 08/19/2013  . Arthritis, degenerative 06/11/2013  . Hypercholesteremia 05/12/2009  . Benign essential tremor 05/12/2009  . Acid reflux 11/14/2008  . Arthritis or polyarthritis, rheumatoid 08/26/2008  . Diabetes mellitus type 2, uncontrolled 08/26/2008  . Adult hypothyroidism 08/26/2008  . Menopausal symptom 08/26/2008    Jomarie Longs PT 09/15/2014, 4:58 PM  [facility] [address] [place], [address] Phone: [phone]   Fax:  [phone]

## 2014-09-17 ENCOUNTER — Ambulatory Visit: Payer: Medicare Other | Admitting: Physical Therapy

## 2014-09-17 ENCOUNTER — Encounter: Payer: Self-pay | Admitting: Physical Therapy

## 2014-09-17 DIAGNOSIS — M6248 Contracture of muscle, other site: Secondary | ICD-10-CM | POA: Diagnosis not present

## 2014-09-17 DIAGNOSIS — M542 Cervicalgia: Secondary | ICD-10-CM

## 2014-09-17 DIAGNOSIS — M62838 Other muscle spasm: Secondary | ICD-10-CM

## 2014-09-17 NOTE — Therapy (Signed)
[facility] [address], [place], [address] Phone: [phone]   Fax:  [phone]  Physical Therapy Treatment  Patient Details  Name: Glenda Williams MRN: [id number] Date of Birth: 03/30/1940 Referring Provider:  Emmaline Kluver.,*  Encounter Date: 09/17/2014      PT End of Session - 09/17/14 0930    Visit Number 5   Number of Visits 8   Date for PT Re-Evaluation 09/28/14   Authorization Type 5   Authorization Time Period 10   PT Start Time 0905   PT Stop Time 0955   PT Time Calculation (min) 50 min   Activity Tolerance Patient tolerated treatment well   Behavior During Therapy [facility] for tasks assessed/performed      Past Medical History  Diagnosis Date  . GERD (gastroesophageal reflux disease)   . Heart disease   . Thyroid disease   . Arthritis   . Rheumatoid arthritis   . A-fib   . Tremors of nervous system     Past Surgical History  Procedure Laterality Date  . Joint replacement Bilateral     knees  . Carpal tunnel release Bilateral   . Colonoscopy  2006  . Breast biopsy Left 1988  . Cholecystectomy      Dr. Bary Castilla  . Breast biopsy  1987    Benign  . Replacement total knee Left 2005  . Replacement total knee Right 1998    There were no vitals filed for this visit.  Visit Diagnosis:  Cervicalgia  Muscle spasms of head or neck      Subjective Assessment - 09/17/14 0908    Subjective Patient reports she is having some numbness in right UE today. She reports it is from sleeping on her left side and having right arm draped across her body. She also reports she has not had to take a muscle spasm pill in 4 days!   Limitations Sitting;Other (comment)  sitting at computer   Currently in Pain? Yes   Pain Score 2    Pain Location Neck   Pain Orientation Right   Pain Descriptors / Indicators Aching;Tingling   Pain Type Acute pain   Pain Onset More than a month ago   Aggravating Factors  sitting, computer work   Pain Relieving Factors controlling posture, moving   Multiple Pain Sites No          [facility] Adult PT Treatment/Exercise - 09/17/14 0937    Exercises   Exercises Other Exercises   Other Exercises  instructed in posture correction and exercises at wall with pillow behind head and towel roll vertically place along thoracic spine, performed shoulder abduction and scaption with verbal cues and assistance to maintain good position against wall, 10 reps each    Modalities   Modalities Ultrasound;Cryotherapy;Electrical Stimulation   Cryotherapy   Cryotherapy Location Cervical 20 min. Ice pack   Acupuncturist Location cervical spine/upper and mid trapezius muscles   Electrical Stimulation parameters    Goals High volt estim. Applied to upper trapezius and mid trapezius muscles with patient seated and ice pack around cervical spine Pain  reduction of muscle spasms   Ultrasound   Ultrasound parameters  Goals 1 MHz 1.4 w/cm2 x 10 min. Along right side cervical spine lower region and into right upper trapezius Pain;Other (Comment)  muscle spasms   Manual Therapy   Manual Therapy Manual Traction;Soft tissue mobilization;Joint mobilization   Manual therapy comments  with patient seated   Joint Mobilization lower cervical spine into thoracic spine: grade 2-3 PA mobilization with patient seated with thoracic spine extension   Soft tissue mobilization    Manual Traction cervical spine traction in sitting 5 reps 5-10 second holds      Patient response to treatment: with verbal cuing and demonstration patient was able to return demonstration with improved technique, decreased muscle spasms by 50% or more noted with [place], manual therapy and estim., no reproduction of symptoms into right UE noted with treatment today          PT Education - 09/17/14 0920    Education provided Yes   Education Details Reinforces  posture, performed postureal exercises at wall: shoulder abduction and scaption with towel roll along spine and pillow behind head   Person(s) Educated Patient   Methods Explanation;Demonstration;Verbal cues   Comprehension Verbalized understanding;Returned demonstration;Verbal cues required;Need further instruction             PT Long Term Goals - 08/31/14 0900    PT LONG TERM GOAL #1   Title Patient will report decreased pain level to <2/10 with prolonged sitting at computer (>30 min.) with good posture by 09/28/2014   Baseline pain level 7/10   Status New   PT LONG TERM GOAL #2   Title Patient will demonstrate imrpoved self perceived disability due to neck symptoms with improved NDI of 25% or less by 7/252016   Baseline NDI = 38%   Status New   PT LONG TERM GOAL #3   Title Patient will be independent with self managment of symptoms and posture awareness with home exercises without cuing in order return to prior level of function by 09/28/2014   Baseline limited/no knowledge of appropriate pain control strategies, posture awareness/exercises   Status New               Plan - 09/17/14 1000    Clinical Impression Statement Patient is progressing well with decreasing right UE sypmtoms. She is sleeping better and has decreased use of muscle spasm medication. She continues with spasms in right side lower cervical spine and upper trapezius muscle with decreased postural control.    Pt will benefit from skilled therapeutic intervention in order to improve on the following deficits Increased muscle spasms;Postural dysfunction;Impaired flexibility;Decreased range of motion;Pain   Rehab Potential Good   PT Frequency 2x / week   PT Duration 4 weeks   PT Treatment/Interventions Manual techniques;Cryotherapy;Electrical Stimulation;Therapeutic exercise;Moist Heat;Ultrasound;Patient/family education   PT Next Visit Plan pain control, reduction of spasms, progressive exercise, manual  techniques   PT Home Exercise Plan continue as instructed with added posture exercises with towel roll along thoracic spine and pillow behind head        Problem List Patient Active Problem List   Diagnosis Date Noted  . Bell palsy 09/04/2014  . Adiposity 09/04/2014  . Hypertension 09/04/2014  . Hypokalemia 08/12/2014  . Allergic rhinitis 08/04/2014  . Anxiety 08/04/2014  . Cannot sleep 08/04/2014  . Neuropathic pain 08/04/2014  . AF (paroxysmal atrial fibrillation) 08/04/2014  . Avitaminosis D 08/04/2014  . Gastroesophageal reflux disease without esophagitis 11/07/2013  . Polypharmacy 08/19/2013  . Arthritis, degenerative 06/11/2013  . Hypercholesteremia 05/12/2009  . Benign essential tremor 05/12/2009  . Acid reflux 11/14/2008  . Arthritis or polyarthritis, rheumatoid 08/26/2008  . Diabetes mellitus type 2, uncontrolled 08/26/2008  . Adult hypothyroidism 08/26/2008  . Menopausal symptom 08/26/2008    Jomarie Longs PT 09/17/2014, 10:41 PM  [facility] [address], [place], [address] Phone: [phone]   Fax:  [phone]

## 2014-09-21 ENCOUNTER — Encounter: Payer: Medicare Other | Admitting: Physical Therapy

## 2014-09-22 ENCOUNTER — Ambulatory Visit: Payer: Medicare Other | Admitting: Physical Therapy

## 2014-09-22 ENCOUNTER — Encounter: Payer: Medicare Other | Admitting: Physical Therapy

## 2014-09-23 ENCOUNTER — Ambulatory Visit: Payer: Medicare Other | Admitting: Physical Therapy

## 2014-09-24 ENCOUNTER — Encounter: Payer: Medicare Other | Admitting: Physical Therapy

## 2014-09-28 DIAGNOSIS — Z79899 Other long term (current) drug therapy: Secondary | ICD-10-CM | POA: Diagnosis not present

## 2014-09-28 DIAGNOSIS — M0579 Rheumatoid arthritis with rheumatoid factor of multiple sites without organ or systems involvement: Secondary | ICD-10-CM | POA: Diagnosis not present

## 2014-09-30 DIAGNOSIS — F419 Anxiety disorder, unspecified: Secondary | ICD-10-CM | POA: Diagnosis not present

## 2014-09-30 DIAGNOSIS — R251 Tremor, unspecified: Secondary | ICD-10-CM | POA: Diagnosis not present

## 2014-09-30 DIAGNOSIS — I10 Essential (primary) hypertension: Secondary | ICD-10-CM | POA: Diagnosis not present

## 2014-09-30 DIAGNOSIS — I482 Chronic atrial fibrillation: Secondary | ICD-10-CM | POA: Diagnosis not present

## 2014-10-01 ENCOUNTER — Ambulatory Visit: Payer: Medicare Other | Admitting: Physical Therapy

## 2014-10-01 DIAGNOSIS — M62838 Other muscle spasm: Secondary | ICD-10-CM

## 2014-10-01 DIAGNOSIS — M542 Cervicalgia: Secondary | ICD-10-CM

## 2014-10-01 DIAGNOSIS — M6248 Contracture of muscle, other site: Secondary | ICD-10-CM | POA: Diagnosis not present

## 2014-10-02 NOTE — Therapy (Signed)
[facility] [address], [place], [address] Phone: [phone]   Fax:  [phone]  Physical Therapy Treatment  Patient Details  Name: Glenda Williams MRN: [id number] Date of Birth: 02-21-1941 Referring Provider:  Emmaline Kluver.,*  Encounter Date: 10/01/2014      PT End of Session - 10/01/14 1145    Visit Number 6   Number of Visits 14   Date for PT Re-Evaluation 10/30/14   Authorization Type 6   Authorization Time Period 10   PT Start Time 1045   PT Stop Time 1130   PT Time Calculation (min) 45 min   Activity Tolerance Patient tolerated treatment well   Behavior During Therapy [facility] for tasks assessed/performed      Past Medical History  Diagnosis Date  . GERD (gastroesophageal reflux disease)   . Heart disease   . Thyroid disease   . Arthritis   . Rheumatoid arthritis   . A-fib   . Tremors of nervous system     Past Surgical History  Procedure Laterality Date  . Joint replacement Bilateral     knees  . Carpal tunnel release Bilateral   . Colonoscopy  2006  . Breast biopsy Left 1988  . Cholecystectomy      Dr. Bary Castilla  . Breast biopsy  1987    Benign  . Replacement total knee Left 2005  . Replacement total knee Right 1998    There were no vitals filed for this visit.  Visit Diagnosis:  Cervicalgia - Plan: PT plan of care cert/re-cert  Muscle spasms of head or neck - Plan: PT plan of care cert/re-cert      Subjective Assessment - 10/01/14 1049    Subjective Patient reports she is improving with less and less pain in right side of neck and has had a good week off with only 2 episodes of increased symptoms in her neck right side crawling. She reports she is having minor sympotms today with sitting in waiting area.    Currently in Pain? Yes   Pain Score 2    Pain Location Neck   Pain Orientation Right   Pain Descriptors / Indicators Aching;Tingling   Pain Type Acute pain   Pain Onset More than a month ago      Objective: NDI: 25% (was 38% initially demonstrating significant improvement in self perceived disability (10% change in score)  Treatment:  Manual therapy: soft tissue mobilization cervical spine/upper trapezius, manual traction cervical spine in sitting 10 reps 5-10 second holds  Exercise: re assessed home program with assistance of therapist to perform chin tucks with improved technique 5 reps, standing at wall with pillow behind head to accomodates excessive forward head and rounded shoulders, performed 2 sets 5 reps of shoulder abduction and scaption with verbal cuing and demonstration, discussed importance of maintaining correct posture with sitting especially at computer in order to prevent symptoms radiating into right hand Electrical stimulations: high volt estim. Applied to bilateral upper trapezius muscles and along medial border of scapulae, continuous mode for muscle spasms and pain reduction, with patient seated with moist heat applied to same x 20 min. With patient seated in chair with back supported   Patient response to treatment: improved technique with exercises with verbal cuing and demonstration, no increased symptoms into right upper trapezius with exercises today, decreased stiffness and pain in right side cervical spine with manual techniques and decreased spasms to mild/non palpable with estim.  PT Education - 10/01/14 1140    Education provided Yes   Education Details Reassessed home exercises against wall, scapular adduction, scaption and abduction, chin tucks with assistance for guided motion and correct positioning   Person(s) Educated Patient   Methods Explanation;Demonstration;Verbal cues   Comprehension Verbalized understanding;Returned demonstration;Tactile cues required;Verbal cues required             PT Long Term Goals - 10/01/14 1054    PT LONG TERM GOAL #1   Title Patient will report  decreased pain level to <2/10 with prolonged sitting at computer (>30 min.) with good posture by 09/28/2014   Baseline can't sit more than 10 min. before the symptoms begin    Status Not Met   PT LONG TERM GOAL #2   Title Patient will demonstrate imrpoved self perceived disability due to neck symptoms with improved NDI of 25% or less by 7/252016   Status Achieved   PT LONG TERM GOAL #3   Title Patient will be independent with self managment of symptoms and posture awareness with home exercises without cuing in order return to prior level of function by 10/30/2014   Baseline limited/no knowledge of appropriate pain control strategies, posture awareness/exercises   Status Revised   PT LONG TERM GOAL #4   Title Patient will demonstrate imrpoved self perceived disability due to neck symptoms with improved NDI of 15% or less by 10/30/2014   Baseline NDI 25%   Status Revised               Plan - 10/01/14 1150    Clinical Impression Statement Patient is progressing well with goals, achieved Goal #2 and is progressing with other goals with revisions made to time frames and goals for NDI. She is expected to achieve goals, demonstrating improved function with ADL's and sleeping over the next 4 weeks with continued physical therapy intervention. She has limitations in cervical spine ROM and limited knowledge of progression of exercises in order to transition to independent self management of symptoms and home program.    Pt will benefit from skilled therapeutic intervention in order to improve on the following deficits Increased muscle spasms;Postural dysfunction;Impaired flexibility;Decreased range of motion;Pain   Rehab Potential Good   Clinical Impairments Affecting Rehab Potential (+) motivated, progress so far, (-) chronic condition with co morbiditiy of degenerative changes in c spine per MRI report   PT Frequency 2x / week   PT Duration 4 weeks   PT Treatment/Interventions Manual  techniques;Cryotherapy;Electrical Stimulation;Therapeutic exercise;Moist Heat;Ultrasound;Patient/family education   PT Next Visit Plan pain control, reduction of spasms, progressive exercise, manual techniques        Problem List Patient Active Problem List   Diagnosis Date Noted  . Bell palsy 09/04/2014  . Adiposity 09/04/2014  . Hypertension 09/04/2014  . Hypokalemia 08/12/2014  . Allergic rhinitis 08/04/2014  . Anxiety 08/04/2014  . Cannot sleep 08/04/2014  . Neuropathic pain 08/04/2014  . AF (paroxysmal atrial fibrillation) 08/04/2014  . Avitaminosis D 08/04/2014  . Gastroesophageal reflux disease without esophagitis 11/07/2013  . Polypharmacy 08/19/2013  . Arthritis, degenerative 06/11/2013  . Hypercholesteremia 05/12/2009  . Benign essential tremor 05/12/2009  . Acid reflux 11/14/2008  . Arthritis or polyarthritis, rheumatoid 08/26/2008  . Diabetes mellitus type 2, uncontrolled 08/26/2008  . Adult hypothyroidism 08/26/2008  . Menopausal symptom 08/26/2008    Jomarie Longs PT 10/02/2014, 12:17 PM  [facility] [address], [place], [address] Phone: [phone]  Fax:  [phone]

## 2014-10-06 ENCOUNTER — Ambulatory Visit: Payer: Medicare Other | Attending: Rheumatology | Admitting: Physical Therapy

## 2014-10-06 ENCOUNTER — Encounter: Payer: Self-pay | Admitting: Physical Therapy

## 2014-10-06 DIAGNOSIS — M6281 Muscle weakness (generalized): Secondary | ICD-10-CM | POA: Diagnosis not present

## 2014-10-06 DIAGNOSIS — M62838 Other muscle spasm: Secondary | ICD-10-CM

## 2014-10-06 DIAGNOSIS — M542 Cervicalgia: Secondary | ICD-10-CM | POA: Insufficient documentation

## 2014-10-06 DIAGNOSIS — M6248 Contracture of muscle, other site: Secondary | ICD-10-CM | POA: Insufficient documentation

## 2014-10-07 ENCOUNTER — Encounter: Payer: Self-pay | Admitting: Physical Therapy

## 2014-10-07 ENCOUNTER — Ambulatory Visit: Payer: Medicare Other | Admitting: Physical Therapy

## 2014-10-07 DIAGNOSIS — M6248 Contracture of muscle, other site: Secondary | ICD-10-CM | POA: Diagnosis not present

## 2014-10-07 DIAGNOSIS — M62838 Other muscle spasm: Secondary | ICD-10-CM

## 2014-10-07 DIAGNOSIS — M542 Cervicalgia: Secondary | ICD-10-CM | POA: Diagnosis not present

## 2014-10-07 DIAGNOSIS — M6281 Muscle weakness (generalized): Secondary | ICD-10-CM | POA: Diagnosis not present

## 2014-10-07 NOTE — Therapy (Signed)
[facility] [address], [place], [address] Phone: [phone]   Fax:  [phone]  Physical Therapy Treatment  Patient Details  Name: Glenda Williams MRN: [id number] Date of Birth: [birth year] Referring Provider:  Emmaline Kluver.,*  Encounter Date: 10/07/2014      PT End of Session - 10/07/14 1055    Visit Number 8   Number of Visits 14   Date for PT Re-Evaluation 10/30/14   Authorization Type 8   Authorization Time Period 10   PT Start Time 1005   PT Stop Time 1100   PT Time Calculation (min) 55 min   Activity Tolerance Patient tolerated treatment well   Behavior During Therapy [facility] for tasks assessed/performed      Past Medical History  Diagnosis Date  . GERD (gastroesophageal reflux disease)   . Heart disease   . Thyroid disease   . Arthritis   . Rheumatoid arthritis   . A-fib   . Tremors of nervous system     Past Surgical History  Procedure Laterality Date  . Joint replacement Bilateral     knees  . Carpal tunnel release Bilateral   . Colonoscopy  2006  . Breast biopsy Left 1988  . Cholecystectomy      Dr. Bary Castilla  . Breast biopsy  1987    Benign  . Replacement total knee Left 2005  . Replacement total knee Right 1998    There were no vitals filed for this visit.  Visit Diagnosis:  Cervicalgia  Muscle spasms of head or neck      Subjective Assessment - 10/07/14 1006    Subjective Patient reports she is sore in both sides of lower cervical spine today, no apparent reason. she was in the pool yesterday and walked for exercise and was fine afterwards. She is having aching and mild tingling with soreness today and continues with tingling in lower cervical spine and into UE's intermittently with prolonged sitting: she is aware of this and gets up and moves before the symptoms intensify.    Limitations Sitting   Currently in Pain? Yes   Pain Score 3    Pain Location Neck   Pain Orientation Lower   Pain Descriptors / Indicators Sore   Pain Type Acute pain   Pain Onset More than a month ago   Aggravating Factors  sitting at the computer >15 min.        Objective:  Observation: increased swelling noted lower cervical spine/upper thoracic spine Palpation: point tender to right of C7/T1 and increased spasms in both upper trapezius muscles Posture: mild to moderate forward head posture, improved since beginning physical therapy      [facility] Adult PT Treatment/Exercise - 10/07/14 1009    Exercises   Exercises Other Exercises   Other Exercises  wall exercises: pillow behind head with shoulder abduction, scaption 2 x 5 reps, manual resistance for cervical spine extension x 5 reps, seated in chair 10# at cable scapular rows x 10 and lat pull downs x 10 reps with guarding and verbal cuing    Modalities   Modalities Ultrasound;Cryotherapy;Electrical Stimulation   Cryotherapy   Cryotherapy Location Cervical   Electrical Stimulation   Electrical Stimulation Location cervical spine/upper and mid trapezius muscles   Electrical Stimulation parameters Goals High volt estim. Applied (4) electrodes to right and left side cervical spine with patient seated, continuous mode/muscle spasm program Pain  reduction of muscle spasms   Ultrasound  Ultrasound parameters  Goals 1MHz pulsed 50% x 10 min. @ 1.4w/cm2 applied to  Upper trapezius muscles and lower cervical spine with patient seated in chair Pain;Other (Comment)  muscle spasms   Manual Therapy   Manual Therapy Manual Traction;Soft tissue mobilization;Joint mobilization   Manual therapy comments with patient seated       Soft tissue mobilization Left upper trapezius into upper thoracic spine paraspinal muscles superficial technques to improve soft tissue elasticity   Manual Traction cervical spine traction in sitting 5 reps 5-10 second holds     Patient response to treatment: decreased observable spasms/swelling  lower cervical spine, decreased point tenderness right C7 region, improved posture and decreased forward head posture noted with wall exercises with improved ability to hold pillow against wall with less effort and more strength. No tingling into UE's at end of session, Patient demonstrated good technique with exercises following demonstration and with verbal cuing           PT Education - 10/07/14 1054    Education provided Yes   Education Details additional exercises for home: scapular row and lat pull down with green resistive band, demonstrated, patient performed with cuing   Person(s) Educated Patient   Methods Explanation;Demonstration;Verbal cues;Handout   Comprehension Verbalized understanding;Returned demonstration;Verbal cues required             PT Long Term Goals - 10/01/14 1054    PT LONG TERM GOAL #1   Title Patient will report decreased pain level to <2/10 with prolonged sitting at computer (>30 min.) with good posture by 09/28/2014   Baseline can't sit more than 10 min. before the symptoms begin    Status Not Met   PT LONG TERM GOAL #2   Title Patient will demonstrate imrpoved self perceived disability due to neck symptoms with improved NDI of 25% or less by 7/252016   Status Achieved   PT LONG TERM GOAL #3   Title Patient will be independent with self managment of symptoms and posture awareness with home exercises without cuing in order return to prior level of function by 10/30/2014   Baseline limited/no knowledge of appropriate pain control strategies, posture awareness/exercises   Status Revised   PT LONG TERM GOAL #4   Title Patient will demonstrate imrpoved self perceived disability due to neck symptoms with improved NDI of 15% or less by 10/30/2014   Baseline NDI 25%   Status Revised               Plan - 10/07/14 1056    Clinical Impression Statement Patient conitnues with decreasing tingling symptoms into upper back/shoulders and only with  soreness today. She is progressing with strengthening for upper back and stabilizaiton for cervical spine and will require continued physical therapy intervention to achieved goals.    Pt will benefit from skilled therapeutic intervention in order to improve on the following deficits Increased muscle spasms;Postural dysfunction;Impaired flexibility;Decreased range of motion;Pain   Rehab Potential Good   PT Frequency 2x / week   PT Duration 4 weeks   PT Treatment/Interventions Manual techniques;Cryotherapy;Electrical Stimulation;Therapeutic exercise;Moist Heat;Ultrasound;Patient/family education   PT Next Visit Plan pain control, reduction of spasms, progressive exercise, manual techniques   PT Home Exercise Plan added resistive band strengthening/stabilization        Problem List Patient Active Problem List   Diagnosis Date Noted  . Bell palsy 09/04/2014  . Adiposity 09/04/2014  . Hypertension 09/04/2014  . Hypokalemia 08/12/2014  . Allergic rhinitis 08/04/2014  . Anxiety 08/04/2014  .  Cannot sleep 08/04/2014  . Neuropathic pain 08/04/2014  . AF (paroxysmal atrial fibrillation) 08/04/2014  . Avitaminosis D 08/04/2014  . Gastroesophageal reflux disease without esophagitis 11/07/2013  . Polypharmacy 08/19/2013  . Arthritis, degenerative 06/11/2013  . Hypercholesteremia 05/12/2009  . Benign essential tremor 05/12/2009  . Acid reflux 11/14/2008  . Arthritis or polyarthritis, rheumatoid 08/26/2008  . Diabetes mellitus type 2, uncontrolled 08/26/2008  . Adult hypothyroidism 08/26/2008  . Menopausal symptom 08/26/2008    Jomarie Longs PT 10/07/2014, 11:03 AM  [facility] [address], [place], [address] Phone: [phone]   Fax:  [phone]

## 2014-10-07 NOTE — Therapy (Signed)
[facility] [address], [place], [address] Phone: [phone]   Fax:  [phone]  Physical Therapy Treatment  Patient Details  Name: Glenda Williams MRN: [id number] Date of Birth: 1940-03-27 Referring Provider:  Emmaline Kluver.,*  Encounter Date: 10/06/2014      PT End of Session - 10/06/14 0945    Visit Number 7   Number of Visits 14   Date for PT Re-Evaluation 10/30/14   Authorization Type 7   Authorization Time Period 10   PT Start Time 0857   PT Stop Time 0940   PT Time Calculation (min) 43 min   Activity Tolerance Patient tolerated treatment well   Behavior During Therapy [facility] for tasks assessed/performed      Past Medical History  Diagnosis Date  . GERD (gastroesophageal reflux disease)   . Heart disease   . Thyroid disease   . Arthritis   . Rheumatoid arthritis   . A-fib   . Tremors of nervous system     Past Surgical History  Procedure Laterality Date  . Joint replacement Bilateral     knees  . Carpal tunnel release Bilateral   . Colonoscopy  2006  . Breast biopsy Left 1988  . Cholecystectomy      Dr. Bary Castilla  . Breast biopsy  1987    Benign  . Replacement total knee Left 2005  . Replacement total knee Right 1998    There were no vitals filed for this visit.  Visit Diagnosis:  Cervicalgia  Muscle spasms of head or neck      Subjective Assessment - 10/06/14 0900    Subjective Patient rpeorts she is having a little more tingling today in right and left side of neck and into upper back on left side today. She may not have slept as well yet.    Limitations Sitting   Currently in Pain? Yes   Pain Score 3    Pain Location Neck   Pain Orientation Other (Comment)  rght neck and left upper back region   Pain Descriptors / Indicators Aching;Tingling   Pain Type Acute pain   Pain Onset More than a month ago   Multiple Pain Sites No       Objective: Posture:  forward head with rounded shoulders, decreased swelling and spasm noted on right side lower cervical spine Palpation: + spasms along left side cervical spine and both upper trapezius muscles left>right today        [facility] Adult PT Treatment/Exercise - 10/06/14 0909    Exercises   Exercises Other Exercises   Other Exercises  Performed exercises at wall with pillow behind head: shoulder forward elevation in scapular plane 2 x 5 reps, abduction to shoulder level 2 x 5 reps, seated scapular adduction with tactile cuing   Modalities   Modalities Cryotherapy;Electrical Stimulation   Cryotherapy   Cryotherapy Location Cervical spine   Electrical Stimulation   Electrical Stimulation Location cervical spine/upper and mid trapezius muscles   Electrical Stimulation parameters  Goals High volt estim (4) electrodes applied to upper trapezius and medial border of scapulae, muscle spasm program Pain  reduction of muscle spasms          Manual Therapy   Manual Therapy Manual Traction;Soft tissue mobilization;Joint mobilization   Manual therapy comments with patient seated       Soft tissue mobilization right upper trapezius into upper thoracic spine paraspinal muscles superficial technques to improve soft tissue  elasticity   Manual Traction cervical spine traction in sitting 5 reps 5-10 second holds      Patient response to treatment: Patient able to perform exercises with decreased support behind head and with good technique with minimal cuing, she reported no tingling or reproduction of symptoms with treatment today          PT Education - 10/06/14 0930    Education provided Yes   Education Details re assessed exercises at wall, decreased pillow thickness to one and required verbal cues to perform with cervical spine isometric extension   Person(s) Educated Patient   Methods Explanation;Verbal cues   Comprehension Verbalized understanding;Returned demonstration;Verbal cues required              PT Long Term Goals - 10/01/14 1054    PT LONG TERM GOAL #1   Title Patient will report decreased pain level to <2/10 with prolonged sitting at computer (>30 min.) with good posture by 09/28/2014   Baseline can't sit more than 10 min. before the symptoms begin    Status Not Met   PT LONG TERM GOAL #2   Title Patient will demonstrate imrpoved self perceived disability due to neck symptoms with improved NDI of 25% or less by 7/252016   Status Achieved   PT LONG TERM GOAL #3   Title Patient will be independent with self managment of symptoms and posture awareness with home exercises without cuing in order return to prior level of function by 10/30/2014   Baseline limited/no knowledge of appropriate pain control strategies, posture awareness/exercises   Status Revised   PT LONG TERM GOAL #4   Title Patient will demonstrate imrpoved self perceived disability due to neck symptoms with improved NDI of 15% or less by 10/30/2014   Baseline NDI 25%   Status Revised               Plan - 10/06/14 0943    Clinical Impression Statement Patient is progressing well with all goals. She is having less intensity of symptoms and decreased frequency of tingling into UE's and is more aware of role that posture plays in her symptoms. She continues to benefit from physical therapy as she progresses with decreased spasms and imrpoving strength in cervical spine/upper back to prevent re occurrence of symptoms in the future.    Pt will benefit from skilled therapeutic intervention in order to improve on the following deficits Increased muscle spasms;Postural dysfunction;Impaired flexibility;Decreased range of motion;Pain   Rehab Potential Good   PT Frequency 2x / week   PT Duration 4 weeks   PT Treatment/Interventions Manual techniques;Cryotherapy;Electrical Stimulation;Therapeutic exercise;Moist Heat;Ultrasound;Patient/family education   PT Next Visit Plan pain control, reduction of spasms,  progressive exercise, manual techniques        Problem List Patient Active Problem List   Diagnosis Date Noted  . Bell palsy 09/04/2014  . Adiposity 09/04/2014  . Hypertension 09/04/2014  . Hypokalemia 08/12/2014  . Allergic rhinitis 08/04/2014  . Anxiety 08/04/2014  . Cannot sleep 08/04/2014  . Neuropathic pain 08/04/2014  . AF (paroxysmal atrial fibrillation) 08/04/2014  . Avitaminosis D 08/04/2014  . Gastroesophageal reflux disease without esophagitis 11/07/2013  . Polypharmacy 08/19/2013  . Arthritis, degenerative 06/11/2013  . Hypercholesteremia 05/12/2009  . Benign essential tremor 05/12/2009  . Acid reflux 11/14/2008  . Arthritis or polyarthritis, rheumatoid 08/26/2008  . Diabetes mellitus type 2, uncontrolled 08/26/2008  . Adult hypothyroidism 08/26/2008  . Menopausal symptom 08/26/2008    Jomarie Longs PT 10/07/2014, 8:48 AM  [facility] [address], [place], [address] Phone: [phone]   Fax:  [phone]

## 2014-10-13 ENCOUNTER — Ambulatory Visit: Payer: Medicare Other | Admitting: Physical Therapy

## 2014-10-13 ENCOUNTER — Encounter: Payer: Self-pay | Admitting: Physical Therapy

## 2014-10-13 DIAGNOSIS — M542 Cervicalgia: Secondary | ICD-10-CM

## 2014-10-13 DIAGNOSIS — M6281 Muscle weakness (generalized): Secondary | ICD-10-CM

## 2014-10-13 DIAGNOSIS — M62838 Other muscle spasm: Secondary | ICD-10-CM

## 2014-10-13 DIAGNOSIS — M6248 Contracture of muscle, other site: Secondary | ICD-10-CM | POA: Diagnosis not present

## 2014-10-13 NOTE — Therapy (Signed)
[facility] [address] [place], [address] Phone: [phone]   Fax:  [phone]  Physical Therapy Treatment  Patient Details  Name: Glenda Williams MRN: [id number] Date of Birth: [birth year] Referring Provider:  Emmaline Kluver.,*  Encounter Date: 10/13/2014      PT End of Session - 10/13/14 0943    Visit Number 9   Number of Visits 14   Date for PT Re-Evaluation 10/30/14   Authorization Type 9   Authorization Time Period 10   PT Start Time 0852   PT Stop Time 0936   PT Time Calculation (min) 44 min   Activity Tolerance Patient tolerated treatment well   Behavior During Therapy [facility] for tasks assessed/performed      Past Medical History  Diagnosis Date  . GERD (gastroesophageal reflux disease)   . Heart disease   . Thyroid disease   . Arthritis   . Rheumatoid arthritis   . A-fib   . Tremors of nervous system     Past Surgical History  Procedure Laterality Date  . Joint replacement Bilateral     knees  . Carpal tunnel release Bilateral   . Colonoscopy  2006  . Breast biopsy Left 1988  . Cholecystectomy      Dr. Bary Castilla  . Breast biopsy  1987    Benign  . Replacement total knee Left 2005  . Replacement total knee Right 1998    There were no vitals filed for this visit.  Visit Diagnosis:  Cervicalgia  Muscle spasms of head or neck  Muscle weakness (generalized)      Subjective Assessment - 10/13/14 0902    Subjective Patient reports she has not had tingling (crawling) sensations in right side of neck/upper back region since last sessioin. She reports she is having mild symptoms today.    Currently in Pain? Yes   Pain Score 2    Pain Location Neck   Pain Orientation Right   Pain Descriptors / Indicators Sore   Pain Type Acute pain   Pain Onset More than a month ago       Objective: Posture: mild to moderate forward head posture with rounded shoulders Palpation: +  spasms along right upper trapezius muscles and cervical spine paraspinal muscles       [facility] Adult PT Treatment/Exercise - 10/13/14 0906    Exercises   Exercises Other Exercises   Other Exercises  At wall: with 1 pillow behind head 3 x 5-10 reps shoulder abduciton and scaption with verbal cues, at [facility] performed 15 reps shoulder rows in seated position in chair 10#, lat pull down to knees x 15 reps with verbal cues and spotting, chest presses x 10 with manual resistance of therapist with patient seated in chair x 10 reps   Modalities   Modalities Ultrasound;                     Ultrasound   Ultrasound Location upper trapezius and right side cervical spine   Ultrasound Parameters 3MHz 20% pulsed @ 0.8w/cm2 x 3 min. over TP and 50% pulsed 1MHz @ 1.4 w/cm2 along upper trapezius and right side cervical spine musculature   Ultrasound Goals Pain;Other (Comment)  muscle spasms   Manual Therapy   Manual Therapy Manual Traction;Soft tissue mobilization;Joint mobilization   Manual therapy comments with patient seated   Joint Mobilization --   Soft tissue mobilization right upper trapezius into upper thoracic spine  paraspinal muscles superficial technques to improve soft tissue elasticity   Manual Traction cervical spine traction in sitting 10 reps 5-10 second holds      Patient response to treatment: improved technique with posture exercises at wall with minimal cuing. Decreased spasms and TP to mild and non tender following manual therapy and [place] techniques           PT Education - 10/13/14 0943    Education provided Yes   Education Details Reassessed posture exercises and home program to continue to prevent tingling into right side of neck/shoulder region   Person(s) Educated Patient   Methods Explanation   Comprehension Verbalized understanding             PT Long Term Goals - 10/01/14 1054    PT LONG TERM GOAL #1   Title Patient will report decreased pain level to <2/10  with prolonged sitting at computer (>30 min.) with good posture by 09/28/2014   Baseline can't sit more than 10 min. before the symptoms begin    Status Not Met   PT LONG TERM GOAL #2   Title Patient will demonstrate imrpoved self perceived disability due to neck symptoms with improved NDI of 25% or less by 7/252016   Status Achieved   PT LONG TERM GOAL #3   Title Patient will be independent with self managment of symptoms and posture awareness with home exercises without cuing in order return to prior level of function by 10/30/2014   Baseline limited/no knowledge of appropriate pain control strategies, posture awareness/exercises   Status Revised   PT LONG TERM GOAL #4   Title Patient will demonstrate imrpoved self perceived disability due to neck symptoms with improved NDI of 15% or less by 10/30/2014   Baseline NDI 25%   Status Revised               Plan - 10/13/14 0945    Clinical Impression Statement Patient is progressing well with decresaed tingling symptoms into upper trapezius and cervical spine. She responded well to treatment with decresaed spasms and no increased symptoms with exercises. She is learning to self manage posture/symptoms with daily tasks and sitting activies.    Pt will benefit from skilled therapeutic intervention in order to improve on the following deficits Increased muscle spasms;Postural dysfunction;Impaired flexibility;Decreased range of motion;Pain   Rehab Potential Good   PT Frequency 2x / week   PT Duration 4 weeks   PT Treatment/Interventions Manual techniques;Cryotherapy;Electrical Stimulation;Therapeutic exercise;Moist Heat;Ultrasound;Patient/family education   PT Next Visit Plan pain control, reduction of spasms, progressive exercise, manual techniques        Problem List Patient Active Problem List   Diagnosis Date Noted  . Bell palsy 09/04/2014  . Adiposity 09/04/2014  . Hypertension 09/04/2014  . Hypokalemia 08/12/2014  . Allergic  rhinitis 08/04/2014  . Anxiety 08/04/2014  . Cannot sleep 08/04/2014  . Neuropathic pain 08/04/2014  . AF (paroxysmal atrial fibrillation) 08/04/2014  . Avitaminosis D 08/04/2014  . Gastroesophageal reflux disease without esophagitis 11/07/2013  . Polypharmacy 08/19/2013  . Arthritis, degenerative 06/11/2013  . Hypercholesteremia 05/12/2009  . Benign essential tremor 05/12/2009  . Acid reflux 11/14/2008  . Arthritis or polyarthritis, rheumatoid 08/26/2008  . Diabetes mellitus type 2, uncontrolled 08/26/2008  . Adult hypothyroidism 08/26/2008  . Menopausal symptom 08/26/2008    Beacher May PT 10/13/2014, 9:48 AM  [facility] [address] [place], [address] Phone: [phone]   Fax:  [phone]

## 2014-10-15 ENCOUNTER — Encounter: Payer: Self-pay | Admitting: Physical Therapy

## 2014-10-15 ENCOUNTER — Ambulatory Visit: Payer: Medicare Other | Admitting: Physical Therapy

## 2014-10-15 DIAGNOSIS — M6248 Contracture of muscle, other site: Secondary | ICD-10-CM | POA: Diagnosis not present

## 2014-10-15 DIAGNOSIS — M6281 Muscle weakness (generalized): Secondary | ICD-10-CM | POA: Diagnosis not present

## 2014-10-15 DIAGNOSIS — M542 Cervicalgia: Secondary | ICD-10-CM

## 2014-10-15 DIAGNOSIS — M62838 Other muscle spasm: Secondary | ICD-10-CM

## 2014-10-16 ENCOUNTER — Ambulatory Visit (INDEPENDENT_AMBULATORY_CARE_PROVIDER_SITE_OTHER): Payer: Medicare Other | Admitting: Family Medicine

## 2014-10-16 ENCOUNTER — Encounter: Payer: Self-pay | Admitting: Family Medicine

## 2014-10-16 ENCOUNTER — Encounter: Payer: Medicare Other | Admitting: Physical Therapy

## 2014-10-16 VITALS — BP 104/70 | HR 80 | Temp 98.2°F | Resp 16 | Wt 234.0 lb

## 2014-10-16 DIAGNOSIS — F419 Anxiety disorder, unspecified: Secondary | ICD-10-CM | POA: Diagnosis not present

## 2014-10-16 DIAGNOSIS — I480 Paroxysmal atrial fibrillation: Secondary | ICD-10-CM | POA: Diagnosis not present

## 2014-10-16 DIAGNOSIS — E119 Type 2 diabetes mellitus without complications: Secondary | ICD-10-CM | POA: Diagnosis not present

## 2014-10-16 DIAGNOSIS — I10 Essential (primary) hypertension: Secondary | ICD-10-CM

## 2014-10-16 DIAGNOSIS — IMO0002 Reserved for concepts with insufficient information to code with codable children: Secondary | ICD-10-CM

## 2014-10-16 DIAGNOSIS — E1165 Type 2 diabetes mellitus with hyperglycemia: Secondary | ICD-10-CM

## 2014-10-16 LAB — POCT UA - MICROALBUMIN: Microalbumin Ur, POC: 20 mg/L

## 2014-10-16 LAB — POCT GLYCOSYLATED HEMOGLOBIN (HGB A1C)
ESTIMATED AVERAGE GLUCOSE: 120
HEMOGLOBIN A1C: 5.8

## 2014-10-16 NOTE — Progress Notes (Signed)
Subjective:    Patient ID: Glenda Williams, female    DOB: 08-13-1940, 74 y.o.   MRN: [id number]  Anxiety Presents for follow-up (Pt incraesed Zoloft at [facility] to 150 mg) visit. Symptoms include dry mouth. Patient reports no chest pain, compulsions, confusion, decreased concentration, depressed mood, dizziness, excessive worry, feeling of choking, hyperventilation, insomnia, irritability, malaise, muscle tension, nausea, nervous/anxious behavior, obsessions, palpitations, panic, restlessness, shortness of breath or suicidal ideas. The severity of symptoms is mild (and improving). The quality of sleep is good.   Past treatments include SSRIs and benzodiazephines. The treatment provided significant relief. Compliance with prior treatments has been good.  Diabetes She presents for her follow-up (Last A1C was checked on 07/14/2014 and was 6.3%) diabetic visit. She has type 2 diabetes mellitus. Pertinent negatives for hypoglycemia include no confusion, dizziness or nervousness/anxiousness. Associated symptoms include fatigue and visual change. Pertinent negatives for diabetes include no blurred vision, no chest pain, no foot paresthesias, no foot ulcerations, no polydipsia, no polyphagia, no polyuria, no weakness and no weight loss. Eye exam is not current.   Hypertension Pt is concerned be the providers at [facility] D/C pt's Metoprolol, and started pt on Propanolol. Pt would like to discuss this with PCP.   Review of Systems  Constitutional: Positive for fatigue. Negative for weight loss and irritability.  Eyes: Negative for blurred vision.  Respiratory: Negative for shortness of breath.   Cardiovascular: Negative for chest pain and palpitations.  Gastrointestinal: Negative for nausea.  Endocrine: Negative for polydipsia, polyphagia and polyuria.  Neurological: Negative for dizziness and weakness.  Psychiatric/Behavioral: Negative for suicidal ideas, confusion and decreased concentration. The patient is not  nervous/anxious and does not have insomnia.    BP 104/70 mmHg  Pulse 80  Temp(Src) 98.2 F (36.8 C) (Oral)  Resp 16  Wt 234 lb (106.142 kg)   Patient Active Problem List   Diagnosis Date Noted  . Bell palsy 09/04/2014  . Adiposity 09/04/2014  . Hypertension 09/04/2014  . Hypokalemia 08/12/2014  . Allergic rhinitis 08/04/2014  . Anxiety 08/04/2014  . Cannot sleep 08/04/2014  . Neuropathic pain 08/04/2014  . AF (paroxysmal atrial fibrillation) 08/04/2014  . Avitaminosis D 08/04/2014  . Gastroesophageal reflux disease without esophagitis 11/07/2013  . Polypharmacy 08/19/2013  . Arthritis, degenerative 06/11/2013  . Hypercholesteremia 05/12/2009  . Benign essential tremor 05/12/2009  . Acid reflux 11/14/2008  . Arthritis or polyarthritis, rheumatoid 08/26/2008  . Diabetes mellitus type 2, uncontrolled 08/26/2008  . Adult hypothyroidism 08/26/2008  . Menopausal symptom 08/26/2008   Past Medical History  Diagnosis Date  . GERD (gastroesophageal reflux disease)   . Heart disease   . Thyroid disease   . Arthritis   . Rheumatoid arthritis   . A-fib   . Tremors of nervous system    Current Outpatient Prescriptions on File Prior to Visit  Medication Sig  . cholecalciferol (VITAMIN D) 1000 UNITS tablet Take 1,000 Units by mouth daily.  . folic acid (FOLVITE) 1 MG tablet Take 1 mg by mouth daily.  . hydrochlorothiazide (HYDRODIURIL) 12.5 MG tablet Take 1 tablet (12.5 mg total) by mouth daily.  . InFLIXimab (REMICADE IV) Inject into the vein. Every 6-8 weeks  . levothyroxine (SYNTHROID, LEVOTHROID) 75 MCG tablet Take 1 tablet by mouth  daily  . LORazepam (ATIVAN) 0.5 MG tablet take 1 to 2 tablets by mouth twice a day if needed anxiety  . methotrexate (RHEUMATREX) 2.5 MG tablet 8 tabs by mouth weekly  . Omega-3 Fatty Acids (FISH OIL)  1000 MG CAPS Take 1 capsule by mouth daily.  Marland Kitchen omeprazole (PRILOSEC) 20 MG capsule Take 20 mg by mouth 2 (two) times daily before a meal.   .  potassium chloride SA (K-DUR,KLOR-CON) 20 MEQ tablet Take 1 tablet by mouth  daily  . Red Yeast Rice 600 MG CAPS Take 1 capsule by mouth daily.  . rivaroxaban (XARELTO) 20 MG TABS tablet Take 20 mg by mouth daily with supper.  . sertraline (ZOLOFT) 50 MG tablet Take 3 tablets (150 mg total) by mouth daily. 2 1/2 for 7 days and then 3 a day.  . cetirizine (ZYRTEC) 10 MG tablet Take by mouth.   No current facility-administered medications on file prior to visit.   Allergies  Allergen Reactions  . Penicillins Rash   Past Surgical History  Procedure Laterality Date  . Joint replacement Bilateral     knees  . Carpal tunnel release Bilateral   . Colonoscopy  2006  . Breast biopsy Left 1988  . Cholecystectomy      Dr. Bary Castilla  . Breast biopsy  1987    Benign  . Replacement total knee Left 2005  . Replacement total knee Right 1998   Social History   Social History  . Marital Status: Married    Spouse Name: N/A  . Number of Children: N/A  . Years of Education: N/A   Occupational History  . Not on file.   Social History Main Topics  . Smoking status: Never Smoker   . Smokeless tobacco: Never Used  . Alcohol Use: No  . Drug Use: No  . Sexual Activity: Not on file   Other Topics Concern  . Not on file   Social History Narrative   Family History  Problem Relation Age of Onset  . Stroke Mother   . Hypertension Mother   . Heart disease Father   . Arthritis Father   . Parkinson's disease Sister   . Parkinson's disease Brother       Objective:   Physical Exam  Constitutional: She is oriented to person, place, and time. She appears well-developed and well-nourished.  Cardiovascular: An irregularly irregular rhythm present.  Pulmonary/Chest: Effort normal and breath sounds normal.  Neurological: She is alert and oriented to person, place, and time.  Psychiatric: She has a normal mood and affect. Her behavior is normal. Thought content normal.   BP 104/70 mmHg  Pulse  80  Temp(Src) 98.2 F (36.8 C) (Oral)  Resp 16  Wt 234 lb (106.142 kg)      Assessment & Plan:  1. Diabetes mellitus type 2 Stable. Improved. Continue current lifestyle and recheck in 3 months.   - POCT glycosylated hemoglobin (Hb A1C) - POCT UA - Microalbumin Results for orders placed or performed in visit on 10/16/14  POCT glycosylated hemoglobin (Hb A1C)  Result Value Ref Range   Hemoglobin A1C 5.8    Est. average glucose Bld gHb Est-mCnc 120   POCT UA - Microalbumin  Result Value Ref Range   Microalbumin Ur, POC 20 mg/L    2. Essential hypertension Stable with medication changes. Continue propranolol. WIll help hypertension and tremor.   4. AF (paroxysmal atrial fibrillation) Continue medication, including Xarelto.    5. Anxiety Greatly improved.   Margarita Rana, MD

## 2014-10-16 NOTE — Therapy (Signed)
[facility] [address] [place], [address] Phone: [phone]   Fax:  [phone]  Physical Therapy Treatment  Patient Details  Name: Glenda Williams MRN: [id number] Date of Birth: 28-May-1940 Referring Provider:  Emmaline Kluver.,*  Encounter Date: 10/15/2014      PT End of Session - 10/15/14 0850    Visit Number 10   Number of Visits 14   Date for PT Re-Evaluation 10/30/14   Authorization Type 10   Authorization Time Period 10   PT Start Time 0805   PT Stop Time 0845   PT Time Calculation (min) 40 min   Activity Tolerance Patient tolerated treatment well      Past Medical History  Diagnosis Date  . GERD (gastroesophageal reflux disease)   . Heart disease   . Thyroid disease   . Arthritis   . Rheumatoid arthritis   . A-fib   . Tremors of nervous system     Past Surgical History  Procedure Laterality Date  . Joint replacement Bilateral     knees  . Carpal tunnel release Bilateral   . Colonoscopy  2006  . Breast biopsy Left 1988  . Cholecystectomy      Dr. Bary Castilla  . Breast biopsy  1987    Benign  . Replacement total knee Left 2005  . Replacement total knee Right 1998    There were no vitals filed for this visit.  Visit Diagnosis:  Cervicalgia  Muscle spasms of head or neck  Muscle weakness (generalized)      Subjective Assessment - 10/15/14 0814    Subjective Patient reports she is having mild crawling feeling in right side lower cervical spine. She feels that some of her symptoms are aggravated by the weather.     Currently in Pain? Yes   Pain Score 3    Pain Location Neck   Pain Orientation Right   Pain Descriptors / Indicators Sore;Tingling   Pain Type Acute pain   Pain Onset More than a month ago      Objective: palpation: + TP/muscle spasms palpable along lower cervical spine into right upper trapezius muslce, posture: moderat forward head posture, decreased  from previous sessions       [place] Adult PT Treatment/Exercise - 10/15/14 0817    Exercises   Exercises Other Exercises   Other Exercises  At wall: with 1 pillow behind head 3 x 5-10 reps shoulder abduciton and scaption with verbal cues, at [facility] performed 15 reps shoulder rows in seated position in chair 10#, lat pull down to knees x 15 reps with verbal cues and spotting, chest presses x 10 with manual resistance of therapist with patient seated in chair x 10 reps   Modalities   Modalities Ultrasound;Cryotherapy;Electrical Stimulation          Ultrasound   Ultrasound parameters and Goals With patient seated: 1MHz [place] pulsed @ 50% 1.4w/cm2 x 10 min to right side lower cervical spine into right upper trapezius over muscle spasms/TP areas  Goals: Pain;Other (Comment)  muscle spasms   Manual Therapy   Manual Therapy Manual Traction;Soft tissue mobilization;Joint mobilization   Manual therapy comments with patient seated   Soft tissue mobilization right upper trapezius into upper thoracic spine paraspinal muscles superficial technques to improve soft tissue elasticity   Manual Traction cervical spine traction in sitting 10 reps 5-10 second holds       Patient response to treatment: improved soft tissue  elasticity, improved posture awareness with exercises, improved endurance in cervical spine/UE with exercises with increased repetitions and no rest periods, improved technique with strengthening exercises at [employer]ME[employer] cable pulley with minimal cuing; no symptoms of crawling in right side cervical spine at end of session         PT Education - 10/15/14 0845    Education provided Yes   Education Details re inforced home program   Person(s) Educated Patient   Methods Explanation;Verbal cues   Comprehension Verbalized understanding;Returned demonstration;Verbal cues required             PT Long Term Goals - 10/15/14 0850    PT LONG TERM GOAL #1   Title Patient will report decreased  pain level to <2/10 with prolonged sitting at computer (>30 min.) with good posture by 09/28/2014   Baseline can't sit more than 10 min. before the symptoms begin    Status Partially Met   PT LONG TERM GOAL #2   Title Patient will demonstrate imrpoved self perceived disability due to neck symptoms with improved NDI of 25% or less by 7/252016   Baseline NDI = 38%   Status Achieved   PT LONG TERM GOAL #3   Title Patient will be independent with self managment of symptoms and posture awareness with home exercises without cuing in order return to prior level of function by 10/30/2014   Baseline limited/no knowledge of appropriate pain control strategies, posture awareness/exercises   Status On-going   PT LONG TERM GOAL #4   Title Patient will demonstrate imrpoved self perceived disability due to neck symptoms with improved NDI of 15% or less by 10/30/2014   Baseline NDI 25%   Status On-going               Plan - 10/15/14 0845    Clinical Impression Statement Progressing well with imrpoved posture with decreasing forward head posture (note 4" head from wall and was ~ 6" initiallly). decreassing spasms and tenderness in cervical spine/right upper trapezius. Progressing with self managment of symptoms well with instruction and guidance ot theapist.    Pt will benefit from skilled therapeutic intervention in order to improve on the following deficits Increased muscle spasms;Postural dysfunction;Impaired flexibility;Decreased range of motion;Pain   Rehab Potential Good   PT Frequency 2x / week   PT Duration 4 weeks   PT Treatment/Interventions Manual techniques;Cryotherapy;Electrical Stimulation;Therapeutic exercise;Moist Heat;Ultrasound;Patient/family education        Problem List Patient Active Problem List   Diagnosis Date Noted  . Bell palsy 09/04/2014  . Adiposity 09/04/2014  . Hypertension 09/04/2014  . Hypokalemia 08/12/2014  . Allergic rhinitis 08/04/2014  . Anxiety  08/04/2014  . Cannot sleep 08/04/2014  . Neuropathic pain 08/04/2014  . AF (paroxysmal atrial fibrillation) 08/04/2014  . Avitaminosis D 08/04/2014  . Gastroesophageal reflux disease without esophagitis 11/07/2013  . Polypharmacy 08/19/2013  . Arthritis, degenerative 06/11/2013  . Hypercholesteremia 05/12/2009  . Benign essential tremor 05/12/2009  . Acid reflux 11/14/2008  . Arthritis or polyarthritis, rheumatoid 08/26/2008  . Diabetes mellitus type 2, controlled 08/26/2008  . Adult hypothyroidism 08/26/2008  . Menopausal symptom 08/26/2008    Jomarie Longs PT 10/16/2014, 12:20 PM  [facility] [address] [place], [address] Phone: [phone]   Fax:  [phone]

## 2014-10-20 ENCOUNTER — Ambulatory Visit: Payer: Medicare Other | Admitting: Physical Therapy

## 2014-10-20 ENCOUNTER — Encounter: Payer: Self-pay | Admitting: Physical Therapy

## 2014-10-20 DIAGNOSIS — M542 Cervicalgia: Secondary | ICD-10-CM

## 2014-10-20 DIAGNOSIS — M62838 Other muscle spasm: Secondary | ICD-10-CM

## 2014-10-20 DIAGNOSIS — M6281 Muscle weakness (generalized): Secondary | ICD-10-CM | POA: Diagnosis not present

## 2014-10-20 DIAGNOSIS — M6248 Contracture of muscle, other site: Secondary | ICD-10-CM | POA: Diagnosis not present

## 2014-10-20 NOTE — Therapy (Signed)
[place] [facility] [address] [place], [address] Phone: [phone]   Fax:  [phone]  Physical Therapy Treatment  Patient Details  Name: Glenda Williams MRN: [id number] Date of Birth: 10/23/1940 Referring Provider:  Emmaline Kluver.,*  Encounter Date: 10/20/2014      PT End of Session - 10/20/14 0848    Visit Number 11   Number of Visits 14   Date for PT Re-Evaluation 10/30/14   Authorization Type 11   Authorization Time Period 20   PT Start Time 0846   PT Stop Time 0925   PT Time Calculation (min) 39 min   Activity Tolerance Patient tolerated treatment well   Behavior During Therapy [facility] for tasks assessed/performed      Past Medical History  Diagnosis Date  . GERD (gastroesophageal reflux disease)   . Heart disease   . Thyroid disease   . Arthritis   . Rheumatoid arthritis   . A-fib   . Tremors of nervous system     Past Surgical History  Procedure Laterality Date  . Joint replacement Bilateral     knees  . Carpal tunnel release Bilateral   . Colonoscopy  2006  . Breast biopsy Left 1988  . Cholecystectomy      Dr. Bary Castilla  . Breast biopsy  1987    Benign  . Replacement total knee Left 2005  . Replacement total knee Right 1998    There were no vitals filed for this visit.  Visit Diagnosis:  Cervicalgia  Muscle spasms of head or neck  Muscle weakness (generalized)      Subjective Assessment - 10/20/14 0849    Subjective Patient reports she has had only slight tingling in neck right side yesterday since previous session. Her tremors have been mild as well and she sees a correlation to tremors and pain. She was able to sit for 4 hours at a concert over the weekend and noticed a little soreness only and was aware of proper positioning of neck.    Currently in Pain? Yes   Pain Score 2    Pain Location Neck   Pain Orientation Right   Pain Descriptors / Indicators Aching   Pain  Type Acute pain   Pain Onset More than a month ago   Pain Frequency Intermittent      Objective: Posture: mild to moderate forward head posture Palpation: + tenderness and TP in right upper trapezius muscle and upper cervical spine right side paraspinal muscles       [facility] Adult PT Treatment/Exercise - 10/20/14 0853    Exercises   Exercises Other Exercises   Other Exercises  At wall: with ~4" towel roll behind head 3 x 5-10 reps shoulder abduciton and scaption with verbal cues, at [facility] performed 15 reps shoulder rows in seated position in chair 10#, lat pull down to knees x 15 reps with verbal cues and spotting, monitoring for correct alignment of head and spine/shoulders, chest presses x 10 with graded manual resistance of therapist with patient seated in chair x 10 reps   Modalities   Modalities Ultrasound;Cryotherapy;          Ultrasound   Ultrasound Location upper trapezius and right side cervical spine paraspinal muscles   Ultrasound Parameters 1 MHz 50% pulsed @1 .4 w/cm2   Ultrasound Goals Pain;Other (Comment)  muscle spasms   Manual Therapy   Manual Therapy Manual Traction;Soft tissue mobilization;Joint mobilization   Manual therapy comments  with patient seated   Soft tissue mobilization right upper trapezius into upper thoracic spine paraspinal muscles superficial technques to improve soft tissue elasticity   Manual Traction cervical spine traction in sitting 10 reps 5-10 second holds     Patient response to treatment: improved spasms to mild and decreased tenderness along right upper trapezius and cervical spine paraspinal muscles, She required verbal cues and guidance to perform all exercises with good alignment of spine/head, demonstrates improved posture awareness and decreased forward head posture with wall exercises as compared to previous session           PT Education - 10/20/14 0925    Education provided Yes   Education Details re assessed home exercises  for posture/strengthening exercises with verbal cuing as needed   Person(s) Educated Patient   Methods Explanation;Verbal cues   Comprehension Verbalized understanding;Returned demonstration;Verbal cues required             PT Long Term Goals - 10/15/14 0850    PT LONG TERM GOAL #1   Title Patient will report decreased pain level to <2/10 with prolonged sitting at computer (>30 min.) with good posture by 09/28/2014   Baseline can't sit more than 10 min. before the symptoms begin    Status Partially Met   PT LONG TERM GOAL #2   Title Patient will demonstrate imrpoved self perceived disability due to neck symptoms with improved NDI of 25% or less by 7/252016   Baseline NDI = 38%   Status Achieved   PT LONG TERM GOAL #3   Title Patient will be independent with self managment of symptoms and posture awareness with home exercises without cuing in order return to prior level of function by 10/30/2014   Baseline limited/no knowledge of appropriate pain control strategies, posture awareness/exercises   Status On-going   PT LONG TERM GOAL #4   Title Patient will demonstrate imrpoved self perceived disability due to neck symptoms with improved NDI of 15% or less by 10/30/2014   Baseline NDI 25%   Status On-going               Plan - 10/20/14 0927    Clinical Impression Statement Patient is progressing well with goals and with self management of symptoms with correcting posture as instructed. She conintues with spasms and intermittent tingling into right shoulder/upper traps and right hand.    Pt will benefit from skilled therapeutic intervention in order to improve on the following deficits Increased muscle spasms;Postural dysfunction;Impaired flexibility;Decreased range of motion;Pain   Rehab Potential Good   PT Frequency 2x / week   PT Duration 4 weeks   PT Treatment/Interventions Manual techniques;Cryotherapy;Electrical Stimulation;Therapeutic exercise;Moist  Heat;Ultrasound;Patient/family education   PT Next Visit Plan pain control, reduction of spasms, progressive exercise, manual techniques        Problem List Patient Active Problem List   Diagnosis Date Noted  . Bell palsy 09/04/2014  . Adiposity 09/04/2014  . Hypertension 09/04/2014  . Hypokalemia 08/12/2014  . Allergic rhinitis 08/04/2014  . Anxiety 08/04/2014  . Cannot sleep 08/04/2014  . Neuropathic pain 08/04/2014  . AF (paroxysmal atrial fibrillation) 08/04/2014  . Avitaminosis D 08/04/2014  . Gastroesophageal reflux disease without esophagitis 11/07/2013  . Polypharmacy 08/19/2013  . Arthritis, degenerative 06/11/2013  . Hypercholesteremia 05/12/2009  . Benign essential tremor 05/12/2009  . Acid reflux 11/14/2008  . Arthritis or polyarthritis, rheumatoid 08/26/2008  . Diabetes mellitus type 2, controlled 08/26/2008  . Adult hypothyroidism 08/26/2008  . Menopausal symptom 08/26/2008  Jomarie Longs PT 10/20/2014, 11:28 PM  [place] [facility] [address] [place], [address] Phone: [phone]   Fax:  [phone]

## 2014-10-22 ENCOUNTER — Other Ambulatory Visit: Payer: Self-pay

## 2014-10-22 ENCOUNTER — Encounter: Payer: Self-pay | Admitting: Physical Therapy

## 2014-10-22 ENCOUNTER — Ambulatory Visit: Payer: Medicare Other | Admitting: Physical Therapy

## 2014-10-22 DIAGNOSIS — M62838 Other muscle spasm: Secondary | ICD-10-CM

## 2014-10-22 DIAGNOSIS — F419 Anxiety disorder, unspecified: Secondary | ICD-10-CM

## 2014-10-22 DIAGNOSIS — M542 Cervicalgia: Secondary | ICD-10-CM

## 2014-10-22 DIAGNOSIS — M6281 Muscle weakness (generalized): Secondary | ICD-10-CM | POA: Diagnosis not present

## 2014-10-22 DIAGNOSIS — M6248 Contracture of muscle, other site: Secondary | ICD-10-CM | POA: Diagnosis not present

## 2014-10-22 MED ORDER — SERTRALINE HCL 50 MG PO TABS: 150.0000 mg | ORAL_TABLET | Freq: Every day | ORAL | Status: DC

## 2014-10-22 NOTE — Therapy (Signed)
[place] [facility] [address], [place], [address] Phone: [phone]   Fax:  [phone]  Physical Therapy Treatment  Patient Details  Name: Glenda Williams MRN: [id number] Date of Birth: 1940/03/08 Referring Provider:  Emmaline Kluver.,*  Encounter Date: 10/22/2014      PT End of Session - 10/22/14 0854    Visit Number 12   Number of Visits 14   Date for PT Re-Evaluation 10/30/14   Authorization Type 12   Authorization Time Period 20   PT Start Time 0809   PT Stop Time 0848   PT Time Calculation (min) 39 min   Activity Tolerance Patient tolerated treatment well   Behavior During Therapy [facility] for tasks assessed/performed      Past Medical History  Diagnosis Date  . GERD (gastroesophageal reflux disease)   . Heart disease   . Thyroid disease   . Arthritis   . Rheumatoid arthritis   . A-fib   . Tremors of nervous system     Past Surgical History  Procedure Laterality Date  . Joint replacement Bilateral     knees  . Carpal tunnel release Bilateral   . Colonoscopy  2006  . Breast biopsy Left 1988  . Cholecystectomy      Dr. Bary Castilla  . Breast biopsy  1987    Benign  . Replacement total knee Left 2005  . Replacement total knee Right 1998    There were no vitals filed for this visit.  Visit Diagnosis:  Cervicalgia  Muscle spasms of head or neck      Subjective Assessment - 10/22/14 0813    Subjective Patient reports she has had only slight tingling in neck right side today since previous session. Her tremors have been mild as well. She is now able to sit for up to an hour before moving due to symptoms in right side of neck/shoulder. She feels stiffness and mild tingling in neck and upper trapezius today. She reports she is much more aware of posture throughout the day and its impact on her symptoms.    Pertinent History     How long can you sit comfortably? at least an hour with mild  discomfort and monitoring for symptoms in neck   Currently in Pain? Yes   Pain Score 2    Pain Location Neck   Pain Orientation Right   Pain Descriptors / Indicators Aching;Tingling;Tightness   Pain Type Acute pain   Pain Onset More than a month ago   Pain Frequency Intermittent      Objective: Palpation: cervical spine/upper trapezius muscles: + TP in upper trapezius muscle, spasms along right cervical spine suboccipital region to C7 Posture: mild forward head posture present with mild rounding of shoulders, much improved since initial assessment          [facility] Adult PT Treatment/Exercise - 10/22/14 0814    Exercises   Exercises Other Exercises   Other Exercises  At wall: with 1 pillow behind head 3 x 5-10 reps shoulder abduciton and scaption with verbal cues, wall push ups x 10 with demonstration and verbal cues for corect alignment of shoulders, trunk, feet and intensity, at [facility] performed 2x 15 reps shoulder rows in seated position in chair 10#, lat pull down to knees 2 x 15 reps 15# with verbal cues and spotting, reverse chin ups with 15# and guided motion to 90 degree shoulder flexion, chest presses x 10 with manual resistance of  therapist with patient seated in chair x 10 reps   Modalities   Modalities Ultrasound;Cryotherapy;Retail [employer] Goals --  reduction of muscle spasms   Ultrasound   Ultrasound Location right lower cervical spine, upper trapezius into cervical spine paraspinal muscles along muscle spasm/TP's   Ultrasound Parameters 3 MHZ 0.8 w/cm2 to TP in upper trapezius/lower cervical spine x 3 min. pulsed @ 20%, 1 MHz pulsed 50% 1.4 w/cm2 x 8 min. right side cervical spine paraspinals into upper trapezius    Ultrasound Goals Pain;Other (Comment)  muscle spasms   Manual Therapy   Manual Therapy Manual Traction;Soft tissue mobilization;Joint mobilization   Manual therapy comments with patient seated   Soft  tissue mobilization right upper trapezius into upper thoracic spine paraspinal muscles superficial technques to improve soft tissue elasticity   Manual Traction cervical spine traction in sitting 10 reps 5-10 second holds       Patient response to treatment: decreased tinging to 0/10 with manual therapy and [place] treatment, improved posture during exercises with verbal cuing to not hike shoulders and for proper alignment of head over trunk, minimal cuing required today, returned demonstration of new exercise at wall (push ups) following demonstration and with verbal cues          PT Education - 10/22/14 0853    Education provided Yes   Education Details instructed in wall push ups with correction of shoulder and hand placement and foot placement for appropriate intensity and posture to not aggravate cervical spine   Person(s) Educated Patient   Methods Explanation;Demonstration;Verbal cues   Comprehension Verbalized understanding;Returned demonstration;Verbal cues required             PT Long Term Goals - 10/15/14 0850    PT LONG TERM GOAL #1   Title Patient will report decreased pain level to <2/10 with prolonged sitting at computer (>30 min.) with good posture by 09/28/2014   Baseline can't sit more than 10 min. before the symptoms begin    Status Partially Met   PT LONG TERM GOAL #2   Title Patient will demonstrate imrpoved self perceived disability due to neck symptoms with improved NDI of 25% or less by 7/252016   Baseline NDI = 38%   Status Achieved   PT LONG TERM GOAL #3   Title Patient will be independent with self managment of symptoms and posture awareness with home exercises without cuing in order return to prior level of function by 10/30/2014   Baseline limited/no knowledge of appropriate pain control strategies, posture awareness/exercises   Status On-going   PT LONG TERM GOAL #4   Title Patient will demonstrate imrpoved self perceived disability due to neck symptoms  with improved NDI of 15% or less by 10/30/2014   Baseline NDI 25%   Status On-going               Plan - 10/22/14 0854    Clinical Impression Statement Patient progressing well with decreased spasms and symptoms of tingling with improved abiltiy to sit for >1 hour. She continues with point tenderness and spasms along right side cervical spine into upper trapezius and C5-T1 and will benefit from additional treatment to address this in order to be able to transition to independent home program without exacerbation of symptoms    Pt will benefit from skilled therapeutic intervention in order to improve on the following deficits Increased muscle spasms;Postural dysfunction;Impaired flexibility;Decreased range of motion;Pain   Rehab Potential Good  PT Frequency 2x / week   PT Duration 4 weeks   PT Treatment/Interventions Manual techniques;Cryotherapy;Electrical Stimulation;Therapeutic exercise;Moist Heat;Ultrasound;Patient/family education        Problem List Patient Active Problem List   Diagnosis Date Noted  . Bell palsy 09/04/2014  . Adiposity 09/04/2014  . Hypertension 09/04/2014  . Hypokalemia 08/12/2014  . Allergic rhinitis 08/04/2014  . Anxiety 08/04/2014  . Cannot sleep 08/04/2014  . Neuropathic pain 08/04/2014  . AF (paroxysmal atrial fibrillation) 08/04/2014  . Avitaminosis D 08/04/2014  . Gastroesophageal reflux disease without esophagitis 11/07/2013  . Polypharmacy 08/19/2013  . Arthritis, degenerative 06/11/2013  . Hypercholesteremia 05/12/2009  . Benign essential tremor 05/12/2009  . Acid reflux 11/14/2008  . Arthritis or polyarthritis, rheumatoid 08/26/2008  . Diabetes mellitus type 2, controlled 08/26/2008  . Adult hypothyroidism 08/26/2008  . Menopausal symptom 08/26/2008    Jomarie Longs PT 10/22/2014, 8:56 AM  [place] [facility] [address] [place], [address] Phone: [phone]    Fax:  [phone]

## 2014-10-23 ENCOUNTER — Encounter: Payer: Medicare Other | Admitting: Physical Therapy

## 2014-10-26 ENCOUNTER — Other Ambulatory Visit: Payer: Self-pay

## 2014-10-26 DIAGNOSIS — F419 Anxiety disorder, unspecified: Secondary | ICD-10-CM

## 2014-10-26 MED ORDER — SERTRALINE HCL 50 MG PO TABS: 150.0000 mg | ORAL_TABLET | Freq: Every day | ORAL | Status: DC

## 2014-10-27 ENCOUNTER — Encounter: Payer: Self-pay | Admitting: Physical Therapy

## 2014-10-27 ENCOUNTER — Ambulatory Visit: Payer: Medicare Other | Admitting: Physical Therapy

## 2014-10-27 DIAGNOSIS — M6281 Muscle weakness (generalized): Secondary | ICD-10-CM | POA: Diagnosis not present

## 2014-10-27 DIAGNOSIS — M6248 Contracture of muscle, other site: Secondary | ICD-10-CM | POA: Diagnosis not present

## 2014-10-27 DIAGNOSIS — M542 Cervicalgia: Secondary | ICD-10-CM | POA: Diagnosis not present

## 2014-10-27 DIAGNOSIS — M62838 Other muscle spasm: Secondary | ICD-10-CM

## 2014-10-27 NOTE — Therapy (Signed)
[place] [facility] [address] [place], [address] Phone: [phone]   Fax:  [phone]  Physical Therapy Treatment/Discharge Summary  Patient Details  Name: Glenda Williams MRN: [id number] Date of Birth: Dec 06, 1940 Referring Provider:  Emmaline Kluver.,*  Encounter Date: 10/27/2014   Patient began physical therapy from 08/31/2014 through 10/27/2014 and attended 13 sessions. Outcome Measure: NDI 16% AROM:cervical spine WFL's rotations, flexion and extension Posture: improved forward head posture by at least 50% from initial evaluation with patient more aware of posture and how it affects her symptoms      PT End of Session - 10/27/14 0937    Visit Number 13   Number of Visits 14   Date for PT Re-Evaluation 10/30/14   Authorization Type 13   Authorization Time Period 20   PT Start Time 0844   PT Stop Time 0935   PT Time Calculation (min) 51 min   Activity Tolerance Patient tolerated treatment well   Behavior During Therapy [facility] for tasks assessed/performed      Past Medical History  Diagnosis Date  . GERD (gastroesophageal reflux disease)   . Heart disease   . Thyroid disease   . Arthritis   . Rheumatoid arthritis   . A-fib   . Tremors of nervous system     Past Surgical History  Procedure Laterality Date  . Joint replacement Bilateral     knees  . Carpal tunnel release Bilateral   . Colonoscopy  2006  . Breast biopsy Left 1988  . Cholecystectomy      Dr. Bary Castilla  . Breast biopsy  1987    Benign  . Replacement total knee Left 2005  . Replacement total knee Right 1998    There were no vitals filed for this visit.  Visit Diagnosis:  Cervicalgia  Muscle spasms of head or neck  Muscle weakness (generalized)      Subjective Assessment - 10/27/14 0845    Subjective Paitent reports she is doing well and is more aware of her posture and how to modify her activity to avoid increased  symptoms. She agrees to discharge to independent home program and self management.    Pertinent History     Limitations Sitting   How long can you sit comfortably? at least an hour  with mild discomfort and monitoring for symptoms in neck   Currently in Pain? yes 2/10 with soreness and stiffness as symptoms           [facility] Adult PT Treatment/Exercise - 10/27/14 0936    Exercises   Exercises Other Exercises   Other Exercises  At wall: with 1 towel roll behind head 3 x 5-10 reps shoulder abduciton and scaption with verbal cues, wall push ups x 10 with demonstration and verbal cues for correct alignment of shoulders, trunk, feet and intensity, at [facility] performed 2 x 15 reps shoulder rows in seated position in chair 10#, lat pull down to knees 2 x 15 reps 15# with verbal cues and spotting, reverse chin ups with 15# and guided motion to 90 degree shoulder flexion, chest presses x 10 with manual resistance of therapist with patient seated in chair x 10 reps   Modalities   Modalities --   Electrical Stimulation   Electrical Stimulation Goals --  reduction of muscle spasms   Ultrasound   Ultrasound Goals --  muscle spasms   Manual Therapy   Manual Therapy Manual Traction;Soft tissue mobilization;Joint mobilization  Manual therapy comments with patient seated   Soft tissue mobilization right upper trapezius into upper thoracic spine paraspinal muscles superficial technques to improve soft tissue elasticity   Manual Traction cervical spine traction in sitting 10 reps 5-10 second holds      Patient response to treatment: improved soft tissue elasticity in cervical spine and upper trapezius following manual therapy treatment, improved posture awareness with minimal to no cuing during exercises          PT Education - 11/10/14 0936    Education provided Yes   Education Details reassessed home program and posture awareness to continue with self management    Person(s) Educated Patient    Methods Explanation;Demonstration;Verbal cues   Comprehension Verbalized understanding;Returned demonstration             PT Long Term Goals - 10-Nov-2014 0939    PT LONG TERM GOAL #1   Title Patient will report decreased pain level to <2/10 with prolonged sitting at computer (>30 min.) with good posture by 09/28/2014   Baseline can't sit more than 10 min. before the symptoms begin    Status Achieved   PT LONG TERM GOAL #2   Title Patient will demonstrate imrpoved self perceived disability due to neck symptoms with improved NDI of 25% or less by 7/252016   Baseline NDI = 38%   Status Achieved   PT LONG TERM GOAL #3   Title Patient will be independent with self managment of symptoms and posture awareness with home exercises without cuing in order return to prior level of function by 10/30/2014   Baseline limited/no knowledge of appropriate pain control strategies, posture awareness/exercises   Status Achieved   PT LONG TERM GOAL #4   Title Patient will demonstrate imrpoved self perceived disability due to neck symptoms with improved NDI of 15% or less by 10/30/2014   Baseline NDI 25%   Status Achieved               Plan - 11/10/14 0937    Clinical Impression Statement Patient demonstrates good posture awareness and has achieved all goals and is ready for discharge to independent home exercise and self managment program.    Rehab Potential Good   PT Frequency 2x / week   PT Duration 4 weeks   PT Treatment/Interventions Manual techniques;Cryotherapy;Electrical Stimulation;Therapeutic exercise;Moist Heat;Ultrasound;Patient/family education   PT Home Exercise Plan recommend continue with strengthening and posture control exercise/awareness          G-Codes - 10-Nov-2014 0939    Functional Assessment Tool Used NDI, pain scale, ROM, clinical judgment   Functional Limitation Changing and maintaining body position   Changing and Maintaining Body Position Goal Status (C3762) At least  1 percent but less than 20 percent impaired, limited or restricted   Changing and Maintaining Body Position Discharge Status (G3151) At least 1 percent but less than 20 percent impaired, limited or restricted      Problem List Patient Active Problem List   Diagnosis Date Noted  . Bell palsy 09/04/2014  . Adiposity 09/04/2014  . Hypertension 09/04/2014  . Hypokalemia 08/12/2014  . Allergic rhinitis 08/04/2014  . Anxiety 08/04/2014  . Cannot sleep 08/04/2014  . Neuropathic pain 08/04/2014  . AF (paroxysmal atrial fibrillation) 08/04/2014  . Avitaminosis D 08/04/2014  . Gastroesophageal reflux disease without esophagitis 11/07/2013  . Polypharmacy 08/19/2013  . Arthritis, degenerative 06/11/2013  . Hypercholesteremia 05/12/2009  . Benign essential tremor 05/12/2009  . Acid reflux 11/14/2008  . Arthritis or polyarthritis,  rheumatoid 08/26/2008  . Diabetes mellitus type 2, controlled 08/26/2008  . Adult hypothyroidism 08/26/2008  . Menopausal symptom 08/26/2008    Jomarie Longs PT 10/27/2014, 9:41 AM  [place] [facility] [address], [place], [address] Phone: [phone]   Fax:  [phone]

## 2014-10-29 ENCOUNTER — Encounter: Payer: Medicare Other | Admitting: Physical Therapy

## 2014-10-29 DIAGNOSIS — G250 Essential tremor: Secondary | ICD-10-CM | POA: Diagnosis not present

## 2014-11-10 DIAGNOSIS — M0579 Rheumatoid arthritis with rheumatoid factor of multiple sites without organ or systems involvement: Secondary | ICD-10-CM | POA: Diagnosis not present

## 2014-12-11 DIAGNOSIS — Z23 Encounter for immunization: Secondary | ICD-10-CM | POA: Diagnosis not present

## 2014-12-22 DIAGNOSIS — M0579 Rheumatoid arthritis with rheumatoid factor of multiple sites without organ or systems involvement: Secondary | ICD-10-CM | POA: Diagnosis not present

## 2014-12-22 DIAGNOSIS — Z79899 Other long term (current) drug therapy: Secondary | ICD-10-CM | POA: Diagnosis not present

## 2014-12-30 DIAGNOSIS — I480 Paroxysmal atrial fibrillation: Secondary | ICD-10-CM | POA: Diagnosis not present

## 2014-12-30 DIAGNOSIS — I10 Essential (primary) hypertension: Secondary | ICD-10-CM | POA: Diagnosis not present

## 2014-12-30 DIAGNOSIS — I482 Chronic atrial fibrillation: Secondary | ICD-10-CM | POA: Diagnosis not present

## 2014-12-30 DIAGNOSIS — R251 Tremor, unspecified: Secondary | ICD-10-CM | POA: Diagnosis not present

## 2015-01-18 ENCOUNTER — Encounter: Payer: Self-pay | Admitting: Family Medicine

## 2015-01-18 ENCOUNTER — Ambulatory Visit (INDEPENDENT_AMBULATORY_CARE_PROVIDER_SITE_OTHER): Payer: Medicare Other | Admitting: Family Medicine

## 2015-01-18 ENCOUNTER — Other Ambulatory Visit: Payer: Self-pay

## 2015-01-18 VITALS — BP 124/78 | HR 76 | Temp 98.4°F | Resp 16 | Wt 229.0 lb

## 2015-01-18 DIAGNOSIS — E119 Type 2 diabetes mellitus without complications: Secondary | ICD-10-CM

## 2015-01-18 DIAGNOSIS — F419 Anxiety disorder, unspecified: Secondary | ICD-10-CM | POA: Diagnosis not present

## 2015-01-18 DIAGNOSIS — I480 Paroxysmal atrial fibrillation: Secondary | ICD-10-CM

## 2015-01-18 LAB — POCT GLYCOSYLATED HEMOGLOBIN (HGB A1C)
Est. average glucose Bld gHb Est-mCnc: 114
Hemoglobin A1C: 5.6

## 2015-01-18 NOTE — Progress Notes (Signed)
Subjective:    Patient ID: Glenda Williams, female    DOB: 1940-07-24, 74 y.o.   MRN: [id number]  Diabetes She presents for her follow-up (Last A1C 10/16/2014 and was 5.8%) diabetic visit. She has type 2 diabetes mellitus. There are no hypoglycemic associated symptoms. Pertinent negatives for hypoglycemia include no headaches or sweats. Associated symptoms include fatigue, polyuria and visual change. Pertinent negatives for diabetes include no blurred vision, no chest pain, no foot paresthesias, no foot ulcerations, no polydipsia, no polyphagia, no weakness and no weight loss. There are no hypoglycemic complications. There are no diabetic complications. When asked about current treatments, none were reported. She rarely (due to arthritis and disc problems) participates in exercise. There is no change in her home blood glucose trend. Her breakfast blood glucose is taken between 7-8 am. Her breakfast blood glucose range is generally 70-90 mg/dl. An ACE inhibitor/angiotensin II receptor blocker is not being taken. Eye exam is not current (possibly about 3 years per pt. Will schedule appointment at [facility]).  Hypertension This is a chronic problem. The problem is controlled. Associated symptoms include malaise/fatigue, neck pain (arthritis), palpitations (improved from the beginning of the year), peripheral edema (arthritis) and shortness of breath (on exertion). Pertinent negatives include no anxiety, blurred vision, chest pain, headaches, orthopnea or sweats. Treatments tried: HCTZ 12.5 mg, Propranolol 40 mg BID.   Anxiety is much better. Does not stress over things anymore. Feels more like herself. Shakes are better.  A fib is better.  She knows she is a lot better.  Would love to go off the medication, but know she is so much better on it and will leave it alone.     Review of Systems  Constitutional: Positive for malaise/fatigue and fatigue. Negative for weight loss.  Eyes: Negative for blurred  vision.  Respiratory: Positive for shortness of breath (on exertion).   Cardiovascular: Positive for palpitations (improved from the beginning of the year). Negative for chest pain and orthopnea.  Endocrine: Positive for polyuria. Negative for polydipsia and polyphagia.  Musculoskeletal: Positive for neck pain (arthritis).  Neurological: Negative for weakness and headaches.  Hematological: Bruises/bleeds easily.   BP 124/78 mmHg  Pulse 76  Temp(Src) 98.4 F (36.9 C) (Oral)  Resp 16  Wt 229 lb (103.874 kg)   Patient Active Problem List   Diagnosis Date Noted  . Bell palsy 09/04/2014  . Adiposity 09/04/2014  . Hypertension 09/04/2014  . Hypokalemia 08/12/2014  . Allergic rhinitis 08/04/2014  . Anxiety 08/04/2014  . Cannot sleep 08/04/2014  . Neuropathic pain 08/04/2014  . AF (paroxysmal atrial fibrillation) ([place]) 08/04/2014  . Avitaminosis D 08/04/2014  . Gastroesophageal reflux disease without esophagitis 11/07/2013  . Polypharmacy 08/19/2013  . Arthritis, degenerative 06/11/2013  . Rheumatoid arthritis ([place]) 06/11/2013  . Hypercholesteremia 05/12/2009  . Benign essential tremor 05/12/2009  . Acid reflux 11/14/2008  . Arthritis or polyarthritis, rheumatoid ([place]) 08/26/2008  . Diabetes mellitus type 2, controlled ([place]) 08/26/2008  . Adult hypothyroidism 08/26/2008  . Menopausal symptom 08/26/2008   Past Medical History  Diagnosis Date  . GERD (gastroesophageal reflux disease)   . Heart disease   . Thyroid disease   . Arthritis   . Rheumatoid arthritis ([place])   . A-fib ([place])   . Tremors of nervous system    Current Outpatient Prescriptions on File Prior to Visit  Medication Sig  . folic acid (FOLVITE) 1 MG tablet Take 1 mg by mouth daily.  . hydrochlorothiazide (HYDRODIURIL) 12.5 MG tablet Take  1 tablet (12.5 mg total) by mouth daily.  . InFLIXimab (REMICADE IV) Inject into the vein. Every 6-8 weeks  . levothyroxine (SYNTHROID, LEVOTHROID) 75 MCG tablet Take 1 tablet  by mouth  daily  . LORazepam (ATIVAN) 0.5 MG tablet take 1 to 2 tablets by mouth twice a day if needed anxiety  . methotrexate (RHEUMATREX) 2.5 MG tablet 8 tabs by mouth weekly  . Omega-3 Fatty Acids (FISH OIL) 1000 MG CAPS Take 1 capsule by mouth daily.  Marland Kitchen omeprazole (PRILOSEC) 20 MG capsule Take 20 mg by mouth 2 (two) times daily before a meal.   . potassium chloride SA (K-DUR,KLOR-CON) 20 MEQ tablet Take 1 tablet by mouth  daily  . propranolol (INDERAL) 40 MG tablet Take by mouth 2 (two) times daily. 2 tablets in the am, 1 tablet in pm  . Red Yeast Rice 600 MG CAPS Take 1 capsule by mouth daily.  . rivaroxaban (XARELTO) 20 MG TABS tablet Take 20 mg by mouth daily with supper.  . sertraline (ZOLOFT) 50 MG tablet Take 3 tablets (150 mg total) by mouth daily.  . cetirizine (ZYRTEC) 10 MG tablet Take by mouth.   No current facility-administered medications on file prior to visit.   Allergies  Allergen Reactions  . Penicillins Rash   Past Surgical History  Procedure Laterality Date  . Joint replacement Bilateral     knees  . Carpal tunnel release Bilateral   . Colonoscopy  2006  . Breast biopsy Left 1988  . Cholecystectomy      Dr. Bary Castilla  . Breast biopsy  1987    Benign  . Replacement total knee Left 2005  . Replacement total knee Right 1998   Social History   Social History  . Marital Status: Married    Spouse Name: N/A  . Number of Children: N/A  . Years of Education: N/A   Occupational History  . Not on file.   Social History Main Topics  . Smoking status: Never Smoker   . Smokeless tobacco: Never Used  . Alcohol Use: No  . Drug Use: No  . Sexual Activity: Not on file   Other Topics Concern  . Not on file   Social History Narrative   Family History  Problem Relation Age of Onset  . Stroke Mother   . Hypertension Mother   . Heart disease Father   . Arthritis Father   . Parkinson's disease Sister   . Parkinson's disease Brother       Objective:    Physical Exam  Constitutional: She is oriented to person, place, and time. She appears well-developed and well-nourished.  Cardiovascular: Normal rate.  An irregularly irregular rhythm present.  Pulmonary/Chest: Effort normal and breath sounds normal.  Neurological: She is alert and oriented to person, place, and time.  Psychiatric: She has a normal mood and affect. Her behavior is normal. Judgment and thought content normal.   BP 124/78 mmHg  Pulse 76  Temp(Src) 98.4 F (36.9 C) (Oral)  Resp 16  Wt 229 lb (103.874 kg)     Assessment & Plan:  1. Controlled type 2 diabetes mellitus without complication, without long-term current use of insulin ([place]) Doing very well.  Continue current medication and plan of care.  Recheck in 3 months.  - POCT glycosylated hemoglobin (Hb A1C) Results for orders placed or performed in visit on 01/18/15  POCT glycosylated hemoglobin (Hb A1C)  Result Value Ref Range   Hemoglobin A1C 5.6    Est. average  glucose Bld gHb Est-mCnc 114     2. AF (paroxysmal atrial fibrillation) ([facility]) Stable. Continue current medication and plan of care.  Feel that her heart beat well controlled.   3. Anxiety Much better on her Sertraline. Everything improved. Will continue current medication and plan of care.    Margarita Rana, MD

## 2015-02-02 DIAGNOSIS — M0579 Rheumatoid arthritis with rheumatoid factor of multiple sites without organ or systems involvement: Secondary | ICD-10-CM | POA: Diagnosis not present

## 2015-02-02 DIAGNOSIS — I482 Chronic atrial fibrillation: Secondary | ICD-10-CM | POA: Diagnosis not present

## 2015-02-02 DIAGNOSIS — G252 Other specified forms of tremor: Secondary | ICD-10-CM | POA: Diagnosis not present

## 2015-03-16 DIAGNOSIS — Z79899 Other long term (current) drug therapy: Secondary | ICD-10-CM | POA: Diagnosis not present

## 2015-03-16 DIAGNOSIS — M0579 Rheumatoid arthritis with rheumatoid factor of multiple sites without organ or systems involvement: Secondary | ICD-10-CM | POA: Diagnosis not present

## 2015-03-26 ENCOUNTER — Other Ambulatory Visit: Payer: Self-pay | Admitting: Family Medicine

## 2015-03-26 DIAGNOSIS — E876 Hypokalemia: Secondary | ICD-10-CM

## 2015-03-26 DIAGNOSIS — I10 Essential (primary) hypertension: Secondary | ICD-10-CM

## 2015-03-26 DIAGNOSIS — E039 Hypothyroidism, unspecified: Secondary | ICD-10-CM

## 2015-03-29 ENCOUNTER — Other Ambulatory Visit: Payer: Self-pay

## 2015-03-29 DIAGNOSIS — K219 Gastro-esophageal reflux disease without esophagitis: Secondary | ICD-10-CM

## 2015-03-30 MED ORDER — OMEPRAZOLE 20 MG PO CPDR: 20.0000 mg | DELAYED_RELEASE_CAPSULE | Freq: Two times a day (BID) | ORAL | Status: DC

## 2015-04-12 ENCOUNTER — Ambulatory Visit (INDEPENDENT_AMBULATORY_CARE_PROVIDER_SITE_OTHER): Payer: Medicare Other | Admitting: Family Medicine

## 2015-04-12 ENCOUNTER — Encounter: Payer: Self-pay | Admitting: Family Medicine

## 2015-04-12 VITALS — BP 116/72 | HR 68 | Temp 98.5°F | Resp 16

## 2015-04-12 DIAGNOSIS — J209 Acute bronchitis, unspecified: Secondary | ICD-10-CM | POA: Diagnosis not present

## 2015-04-12 DIAGNOSIS — H109 Unspecified conjunctivitis: Secondary | ICD-10-CM | POA: Diagnosis not present

## 2015-04-12 MED ORDER — SULFACETAMIDE SODIUM 10 % OP SOLN: 2.0000 [drp] | OPHTHALMIC | Status: DC

## 2015-04-12 MED ORDER — AZITHROMYCIN 250 MG PO TABS: ORAL_TABLET | ORAL | Status: DC

## 2015-04-12 NOTE — Progress Notes (Signed)
Subjective:    Patient ID: Glenda Williams, female    DOB: [birth year], 75 y.o.   MRN: [id number]  URI  This is a new problem. Episode onset: x 5 days. The problem has been gradually worsening. There has been no fever. Associated symptoms include chest pain, congestion, coughing (dry), ear pain, headaches (dull), a plugged ear sensation, rhinorrhea, sinus pain, sneezing, a sore throat and swollen glands. Pertinent negatives include no abdominal pain, diarrhea, dysuria, nausea, vomiting or wheezing. Treatments tried: Flonase, Coricidin. The treatment provided moderate relief.   Also with conjunctivitis.  Eyes matted shut this am.     Review of Systems  HENT: Positive for congestion, ear pain, rhinorrhea, sneezing and sore throat.   Respiratory: Positive for cough (dry). Negative for wheezing.   Cardiovascular: Positive for chest pain.  Gastrointestinal: Negative for nausea, vomiting, abdominal pain and diarrhea.  Genitourinary: Negative for dysuria.  Neurological: Positive for headaches (dull).   BP 116/72 mmHg  Pulse 68  Temp(Src) 98.5 F (36.9 C) (Oral)  Resp 16  SpO2 98%   Patient Active Problem List   Diagnosis Date Noted  . Bell palsy 09/04/2014  . Adiposity 09/04/2014  . Hypertension 09/04/2014  . Hypokalemia 08/12/2014  . Allergic rhinitis 08/04/2014  . Anxiety 08/04/2014  . Cannot sleep 08/04/2014  . Neuropathic pain 08/04/2014  . AF (paroxysmal atrial fibrillation) ([place]) 08/04/2014  . Avitaminosis D 08/04/2014  . Gastroesophageal reflux disease without esophagitis 11/07/2013  . Polypharmacy 08/19/2013  . Arthritis, degenerative 06/11/2013  . Rheumatoid arthritis ([place]) 06/11/2013  . Hypercholesteremia 05/12/2009  . Benign essential tremor 05/12/2009  . Acid reflux 11/14/2008  . Arthritis or polyarthritis, rheumatoid ([place]) 08/26/2008  . Diabetes mellitus type 2, controlled ([place]) 08/26/2008  . Adult hypothyroidism 08/26/2008  . Menopausal symptom 08/26/2008   Past  Medical History  Diagnosis Date  . GERD (gastroesophageal reflux disease)   . Heart disease   . Thyroid disease   . Arthritis   . Rheumatoid arthritis ([place])   . A-fib ([place])   . Tremors of nervous system    Current Outpatient Prescriptions on File Prior to Visit  Medication Sig  . folic acid (FOLVITE) 1 MG tablet Take 1 mg by mouth daily.  . hydrochlorothiazide (HYDRODIURIL) 12.5 MG tablet Take 1 tablet by mouth  daily  . InFLIXimab (REMICADE IV) Inject into the vein. Every 6-8 weeks  . levothyroxine (SYNTHROID, LEVOTHROID) 75 MCG tablet Take 1 tablet by mouth  daily  . methotrexate (RHEUMATREX) 2.5 MG tablet 8 tabs by mouth weekly  . Omega-3 Fatty Acids (FISH OIL) 1000 MG CAPS Take 1 capsule by mouth daily.  Marland Kitchen omeprazole (PRILOSEC) 20 MG capsule Take 1 capsule (20 mg total) by mouth 2 (two) times daily before a meal.  . potassium chloride SA (K-DUR,KLOR-CON) 20 MEQ tablet Take 1 tablet by mouth  daily  . propranolol (INDERAL) 40 MG tablet Take by mouth 2 (two) times daily. 2 tablets in the am, 1 tablet in pm  . Red Yeast Rice 600 MG CAPS Take 1 capsule by mouth daily.  . rivaroxaban (XARELTO) 20 MG TABS tablet Take 20 mg by mouth daily with supper.  . sertraline (ZOLOFT) 50 MG tablet Take 3 tablets (150 mg total) by mouth daily.  Marland Kitchen FLUZONE HIGH-DOSE 0.5 ML SUSY Reported on 04/12/2015  . LORazepam (ATIVAN) 0.5 MG tablet take 1 to 2 tablets by mouth twice a day if needed anxiety (Patient not taking: Reported on 04/12/2015)   No current facility-administered medications  on file prior to visit.   Allergies  Allergen Reactions  . Penicillins Rash   Past Surgical History  Procedure Laterality Date  . Joint replacement Bilateral     knees  . Carpal tunnel release Bilateral   . Colonoscopy  2006  . Breast biopsy Left 1988  . Cholecystectomy      Dr. Bary Castilla  . Breast biopsy  1987    Benign  . Replacement total knee Left 2005  . Replacement total knee Right 1998   Social History    Social History  . Marital Status: Married    Spouse Name: N/A  . Number of Children: N/A  . Years of Education: N/A   Occupational History  . Not on file.   Social History Main Topics  . Smoking status: Never Smoker   . Smokeless tobacco: Never Used  . Alcohol Use: No  . Drug Use: No  . Sexual Activity: Not on file   Other Topics Concern  . Not on file   Social History Narrative   Family History  Problem Relation Age of Onset  . Stroke Mother   . Hypertension Mother   . Heart disease Father   . Arthritis Father   . Parkinson's disease Sister   . Parkinson's disease Brother       Objective:   Physical Exam  Constitutional: She appears well-developed and well-nourished.  HENT:  Right Ear: Tympanic membrane normal.  Left Ear: Tympanic membrane normal.  Mouth/Throat: Oropharynx is clear and moist.  Turbinates swollen  Eyes: Right eye exhibits discharge. Left eye exhibits discharge. Right conjunctiva is injected. Left conjunctiva is injected.  Cardiovascular: Normal rate.   Pulmonary/Chest: Effort normal and breath sounds normal.  Psychiatric: She has a normal mood and affect. Her behavior is normal.   BP 116/72 mmHg  Pulse 68  Temp(Src) 98.5 F (36.9 C) (Oral)  Resp 16  SpO2 98%     Assessment & Plan:  1. Bilateral conjunctivitis New problem. Condition is worsening. Will start medication for better control.  Patient instructed to call back if condition worsens or does not improve.    - sulfacetamide (BLEPH-10) 10 % ophthalmic solution; Place 2 drops into both eyes every 3 (three) hours.  Dispense: 15 mL; Refill: 0  2. Acute bronchitis, unspecified organism Will hold prescription unless get worse, improves and then worsens again, fevers or other change in symptoms. Will call if start medication.  - azithromycin (ZITHROMAX) 250 MG tablet; 2 today and then one a day for 4 more days.  Dispense: 6 tablet; Refill: 0  Patient was seen and examined by Jerrell Belfast, MD, and note scribed by Renaldo Fiddler, CMA. I have reviewed the document for accuracy and completeness and I agree with above. Jerrell Belfast, MD   Margarita Rana, MD

## 2015-04-26 ENCOUNTER — Encounter: Payer: Self-pay | Admitting: Family Medicine

## 2015-04-26 ENCOUNTER — Ambulatory Visit (INDEPENDENT_AMBULATORY_CARE_PROVIDER_SITE_OTHER): Payer: Medicare Other | Admitting: Family Medicine

## 2015-04-26 VITALS — BP 116/78 | HR 72 | Temp 98.1°F | Resp 16

## 2015-04-26 DIAGNOSIS — R7303 Prediabetes: Secondary | ICD-10-CM | POA: Diagnosis not present

## 2015-04-26 DIAGNOSIS — R682 Dry mouth, unspecified: Secondary | ICD-10-CM | POA: Diagnosis not present

## 2015-04-26 DIAGNOSIS — I10 Essential (primary) hypertension: Secondary | ICD-10-CM

## 2015-04-26 LAB — POCT GLYCOSYLATED HEMOGLOBIN (HGB A1C)
Est. average glucose Bld gHb Est-mCnc: 126
HEMOGLOBIN A1C: 6

## 2015-04-26 NOTE — Progress Notes (Signed)
Subjective:    Patient ID: Glenda Williams, female    DOB: 23-Sep-1940, 75 y.o.   MRN: [id number]  Diabetes She presents for her follow-up (Last A1C was 01/18/2015 and was 5.6%) diabetic visit. She has type 2 diabetes mellitus. Her disease course has been stable. There are no hypoglycemic associated symptoms. Pertinent negatives for hypoglycemia include no headaches or sweats. Associated symptoms include fatigue. Pertinent negatives for diabetes include no blurred vision, no chest pain, no foot paresthesias, no foot ulcerations, no polydipsia, no polyphagia, no polyuria, no visual change, no weakness and no weight loss. Symptoms are stable. Risk factors for coronary artery disease include diabetes mellitus, dyslipidemia, hypertension, obesity and post-menopausal. When asked about current treatments, none were reported. She is compliant with treatment all of the time. An ACE inhibitor/angiotensin II receptor blocker is not being taken. Eye exam is not current (Is going to schedule one with [facility]).  Hypertension This is a chronic problem. The problem has been gradually improving since onset. The problem is controlled. Associated symptoms include malaise/fatigue. Pertinent negatives include no anxiety (controlled with Sertraline), blurred vision, chest pain, headaches, orthopnea, palpitations, peripheral edema, shortness of breath or sweats. Past treatments include diuretics (HCTZ 12.5 mg). The current treatment provides moderate improvement. There are no compliance problems.       Review of Systems  Constitutional: Positive for malaise/fatigue and fatigue. Negative for weight loss.  Eyes: Negative for blurred vision.  Respiratory: Negative for shortness of breath.   Cardiovascular: Negative for chest pain, palpitations and orthopnea.  Endocrine: Negative for polydipsia, polyphagia and polyuria.  Neurological: Negative for weakness and headaches.   BP 116/78 mmHg  Pulse 72  Temp(Src) 98.1 F  (36.7 C) (Oral)  Resp 16   Patient Active Problem List   Diagnosis Date Noted  . Bell palsy 09/04/2014  . Adiposity 09/04/2014  . Hypertension 09/04/2014  . Hypokalemia 08/12/2014  . Allergic rhinitis 08/04/2014  . Anxiety 08/04/2014  . Cannot sleep 08/04/2014  . Neuropathic pain 08/04/2014  . AF (paroxysmal atrial fibrillation) ([place] [facility] [place]) 08/04/2014  . Avitaminosis D 08/04/2014  . Gastroesophageal reflux disease without esophagitis 11/07/2013  . Polypharmacy 08/19/2013  . Arthritis, degenerative 06/11/2013  . Rheumatoid arthritis ([place]) 06/11/2013  . Hypercholesteremia 05/12/2009  . Benign essential tremor 05/12/2009  . Acid reflux 11/14/2008  . Arthritis or polyarthritis, rheumatoid ([place]) 08/26/2008  . Diabetes mellitus type 2, controlled ([place]) 08/26/2008  . Adult hypothyroidism 08/26/2008  . Menopausal symptom 08/26/2008   Past Medical History  Diagnosis Date  . GERD (gastroesophageal reflux disease)   . Heart disease   . Thyroid disease   . Arthritis   . Rheumatoid arthritis ([place])   . A-fib ([place])   . Tremors of nervous system    Current Outpatient Prescriptions on File Prior to Visit  Medication Sig  . folic acid (FOLVITE) 1 MG tablet Take 1 mg by mouth daily.  . hydrochlorothiazide (HYDRODIURIL) 12.5 MG tablet Take 1 tablet by mouth  daily  . InFLIXimab (REMICADE IV) Inject into the vein. Every 6-8 weeks  . levothyroxine (SYNTHROID, LEVOTHROID) 75 MCG tablet Take 1 tablet by mouth  daily  . methotrexate (RHEUMATREX) 2.5 MG tablet 8 tabs by mouth weekly  . Omega-3 Fatty Acids (FISH OIL) 1000 MG CAPS Take 1 capsule by mouth daily.  Marland Kitchen omeprazole (PRILOSEC) 20 MG capsule Take 1 capsule (20 mg total) by mouth 2 (two) times daily before a meal.  . potassium chloride SA (K-DUR,KLOR-CON) 20 MEQ tablet Take 1 tablet  by mouth  daily  . propranolol (INDERAL) 40 MG tablet Take by mouth 2 (two) times daily. 2 tablets in the am, 1 tablet in pm  . Red Yeast Rice 600 MG CAPS Take 1  capsule by mouth daily.  . rivaroxaban (XARELTO) 20 MG TABS tablet Take 20 mg by mouth daily with supper.  . sertraline (ZOLOFT) 50 MG tablet Take 3 tablets (150 mg total) by mouth daily.  Marland Kitchen LORazepam (ATIVAN) 0.5 MG tablet take 1 to 2 tablets by mouth twice a day if needed anxiety (Patient not taking: Reported on 04/12/2015)   No current facility-administered medications on file prior to visit.   Allergies  Allergen Reactions  . Penicillins Rash   Past Surgical History  Procedure Laterality Date  . Joint replacement Bilateral     knees  . Carpal tunnel release Bilateral   . Colonoscopy  2006  . Breast biopsy Left 1988  . Cholecystectomy      Dr. Bary Castilla  . Breast biopsy  1987    Benign  . Replacement total knee Left 2005  . Replacement total knee Right 1998   Social History   Social History  . Marital Status: Married    Spouse Name: N/A  . Number of Children: N/A  . Years of Education: N/A   Occupational History  . Not on file.   Social History Main Topics  . Smoking status: Never Smoker   . Smokeless tobacco: Never Used  . Alcohol Use: No  . Drug Use: No  . Sexual Activity: Not on file   Other Topics Concern  . Not on file   Social History Narrative   Family History  Problem Relation Age of Onset  . Stroke Mother   . Hypertension Mother   . Heart disease Father   . Arthritis Father   . Parkinson's disease Sister   . Parkinson's disease Brother        Objective:   Physical Exam  Constitutional: She is oriented to person, place, and time. She appears well-developed and well-nourished.  Neurological: She is alert and oriented to person, place, and time.  Psychiatric: She has a normal mood and affect. Her behavior is normal. Judgment and thought content normal.   BP 116/78 mmHg  Pulse 72  Temp(Src) 98.1 F (36.7 C) (Oral)  Resp 16      Assessment & Plan:  1. Prediabetes Stable. Continue to work on lifestyle changes.   - POCT glycosylated  hemoglobin (Hb A1C) Results for orders placed or performed in visit on 04/26/15  POCT glycosylated hemoglobin (Hb A1C)  Result Value Ref Range   Hemoglobin A1C 6.0    Est. average glucose Bld gHb Est-mCnc 126     2. Essential hypertension Condition is stable. Please continue current medication and  plan of care as noted.    3. Dry mouth Suspect related to her medication.  Does not want to change anything at this time, as she is stable.     Patient was seen and examined by Jerrell Belfast, MD, and note scribed by Renaldo Fiddler, CMA. I have reviewed the document for accuracy and completeness and I agree with above. Jerrell Belfast, MD   Margarita Rana, MD

## 2015-04-27 DIAGNOSIS — M0579 Rheumatoid arthritis with rheumatoid factor of multiple sites without organ or systems involvement: Secondary | ICD-10-CM | POA: Diagnosis not present

## 2015-05-20 DIAGNOSIS — H2513 Age-related nuclear cataract, bilateral: Secondary | ICD-10-CM | POA: Diagnosis not present

## 2015-06-08 DIAGNOSIS — Z79899 Other long term (current) drug therapy: Secondary | ICD-10-CM | POA: Diagnosis not present

## 2015-06-08 DIAGNOSIS — M0579 Rheumatoid arthritis with rheumatoid factor of multiple sites without organ or systems involvement: Secondary | ICD-10-CM | POA: Diagnosis not present

## 2015-06-15 ENCOUNTER — Telehealth: Payer: Self-pay | Admitting: Family Medicine

## 2015-06-15 ENCOUNTER — Other Ambulatory Visit: Payer: Self-pay | Admitting: Family Medicine

## 2015-06-15 DIAGNOSIS — Z1231 Encounter for screening mammogram for malignant neoplasm of breast: Secondary | ICD-10-CM

## 2015-06-21 ENCOUNTER — Ambulatory Visit
Admission: RE | Admit: 2015-06-21 | Discharge: 2015-06-21 | Disposition: A | Discharge status: Home / Self Care | Payer: Medicare Other | Source: Ambulatory Visit | Attending: Family Medicine | Admitting: Family Medicine

## 2015-06-21 ENCOUNTER — Other Ambulatory Visit: Payer: Self-pay | Admitting: Family Medicine

## 2015-06-21 DIAGNOSIS — Z1231 Encounter for screening mammogram for malignant neoplasm of breast: Secondary | ICD-10-CM | POA: Insufficient documentation

## 2015-06-25 DIAGNOSIS — I481 Persistent atrial fibrillation: Secondary | ICD-10-CM | POA: Diagnosis not present

## 2015-06-25 DIAGNOSIS — I10 Essential (primary) hypertension: Secondary | ICD-10-CM | POA: Diagnosis not present

## 2015-06-25 DIAGNOSIS — I480 Paroxysmal atrial fibrillation: Secondary | ICD-10-CM | POA: Diagnosis not present

## 2015-06-25 DIAGNOSIS — I4891 Unspecified atrial fibrillation: Secondary | ICD-10-CM | POA: Diagnosis not present

## 2015-07-20 DIAGNOSIS — M79642 Pain in left hand: Secondary | ICD-10-CM | POA: Diagnosis not present

## 2015-07-20 DIAGNOSIS — G8929 Other chronic pain: Secondary | ICD-10-CM | POA: Diagnosis not present

## 2015-07-20 DIAGNOSIS — M25561 Pain in right knee: Secondary | ICD-10-CM | POA: Diagnosis not present

## 2015-07-20 DIAGNOSIS — M0579 Rheumatoid arthritis with rheumatoid factor of multiple sites without organ or systems involvement: Secondary | ICD-10-CM | POA: Diagnosis not present

## 2015-07-20 DIAGNOSIS — M25562 Pain in left knee: Secondary | ICD-10-CM | POA: Diagnosis not present

## 2015-07-26 ENCOUNTER — Encounter: Payer: Self-pay | Admitting: Family Medicine

## 2015-07-26 ENCOUNTER — Ambulatory Visit
Admission: RE | Admit: 2015-07-26 | Discharge: 2015-07-26 | Disposition: A | Discharge status: Home / Self Care | Payer: Medicare Other | Source: Ambulatory Visit | Attending: Family Medicine | Admitting: Family Medicine

## 2015-07-26 ENCOUNTER — Ambulatory Visit (INDEPENDENT_AMBULATORY_CARE_PROVIDER_SITE_OTHER): Payer: Medicare Other | Admitting: Family Medicine

## 2015-07-26 VITALS — BP 110/76 | HR 64 | Temp 98.5°F | Resp 16 | Wt 235.0 lb

## 2015-07-26 DIAGNOSIS — E7800 Pure hypercholesterolemia, unspecified: Secondary | ICD-10-CM

## 2015-07-26 DIAGNOSIS — R350 Frequency of micturition: Secondary | ICD-10-CM

## 2015-07-26 DIAGNOSIS — R05 Cough: Secondary | ICD-10-CM

## 2015-07-26 DIAGNOSIS — E039 Hypothyroidism, unspecified: Secondary | ICD-10-CM

## 2015-07-26 DIAGNOSIS — R059 Cough, unspecified: Secondary | ICD-10-CM

## 2015-07-26 DIAGNOSIS — IMO0001 Reserved for inherently not codable concepts without codable children: Secondary | ICD-10-CM

## 2015-07-26 DIAGNOSIS — R7303 Prediabetes: Secondary | ICD-10-CM | POA: Diagnosis not present

## 2015-07-26 DIAGNOSIS — I10 Essential (primary) hypertension: Secondary | ICD-10-CM

## 2015-07-26 LAB — POCT GLYCOSYLATED HEMOGLOBIN (HGB A1C)
ESTIMATED AVERAGE GLUCOSE: 123
Hemoglobin A1C: 5.9

## 2015-07-26 NOTE — Addendum Note (Signed)
Addended by: Shawna Orleans on: 07/26/2015 04:50 PM   Modules accepted: Orders

## 2015-07-26 NOTE — Progress Notes (Signed)
Patient ID: Glenda Williams, female   DOB: 1940/12/01, 75 y.o.   MRN: [id number]       Patient: Glenda Williams Female    DOB: 08/08/1940   75 y.o.   MRN: [id number] Visit Date: 07/26/2015  Today's Provider: Margarita Rana, MD   Chief Complaint  Patient presents with  . Hyperglycemia   Subjective:    HPI  Prediabetes, Follow-up:   Lab Results  Component Value Date   HGBA1C 5.9 07/26/2015   HGBA1C 6.0 04/26/2015   HGBA1C 5.6 01/18/2015   GLUCOSE 92 02/13/2014   GLUCOSE 100* 02/12/2014    Last seen for for this3 months ago.  Management since that visit includes checking labs. Current symptoms include none and have been stable.  Weight trend: stable Prior visit with dietician: no Current diet: in general, a "healthy" diet   Current exercise: cardiovascular workout on exercise equipment  Pertinent Labs:    Component Value Date/Time   CHOL 182 07/14/2014   CHOL 143 02/13/2014 0356   TRIG 101 07/14/2014   TRIG 87 02/13/2014 0356   CREATININE 0.8 07/14/2014   CREATININE 0.77 02/13/2014 0356    Wt Readings from Last 3 Encounters:  07/26/15 235 lb (106.595 kg)  01/18/15 229 lb (103.874 kg)  10/16/14 234 lb (106.142 kg)        Hypertension, follow-up:  BP Readings from Last 3 Encounters:  07/26/15 110/76  04/26/15 116/78  04/12/15 116/72    She was last seen for hypertension 3 months ago.  BP at that visit was 116/78. Management changes since that visit include no changes. She reports excellent compliance with treatment. She is not having side effects. She is exercising. She is adherent to low salt diet.   Outside blood pressures are stable. She is experiencing none.  Patient denies chest pain.   Cardiovascular risk factors include advanced age (older than 76 for men, 7 for women) and obesity (BMI >= 30 kg/m2).  Use of agents associated with hypertension: none.     Weight trend: stable Wt Readings from Last 3 Encounters:  07/26/15 235 lb (106.595 kg)    01/18/15 229 lb (103.874 kg)  10/16/14 234 lb (106.142 kg)    Current diet: in general, a "healthy" diet    ------------------------------------------------------------------------  Patient c/o frequency for several years. Patient reports that she is unable to control urine. Patient reports that she goes to the bathroom every hour to try to prevent accidents. Patient unable to leave a urine sample at office visit. Patient denies painful or burning with urination.    Cough: Patient complains of productive cough.  Symptoms began several weeks ago.  The cough is without wheezing, dyspnea or hemoptysis, waxing and waning over time and is aggravated by nothing Associated symptoms include:sputum production. Patient does not have new pets. Patient does not have a history of asthma. Patient does have a history of environmental allergens. Patient does not have recent travel. Patient does not have a history of smoking. Patient  does not have previous Chest X-ray.    Allergies  Allergen Reactions  . Penicillins Rash   Previous Medications   FOLIC ACID (FOLVITE) 1 MG TABLET    Take 1 mg by mouth daily.   HYDROCHLOROTHIAZIDE (HYDRODIURIL) 12.5 MG TABLET    Take 1 tablet by mouth  daily   INFLIXIMAB (REMICADE IV)    Inject into the vein. Every 6-8 weeks   LEVOTHYROXINE (SYNTHROID, LEVOTHROID) 75 MCG TABLET    Take 1 tablet by mouth  daily   METHOTREXATE (RHEUMATREX) 2.5 MG TABLET    8 tabs by mouth weekly   OMEGA-3 FATTY ACIDS (FISH OIL) 1000 MG CAPS    Take 1 capsule by mouth daily.   OMEPRAZOLE (PRILOSEC) 20 MG CAPSULE    Take 1 capsule (20 mg total) by mouth 2 (two) times daily before a meal.   POTASSIUM CHLORIDE SA (K-DUR,KLOR-CON) 20 MEQ TABLET    Take 1 tablet by mouth  daily   PREDNISONE (DELTASONE) 5 MG TABLET       PROPRANOLOL (INDERAL) 40 MG TABLET    Take by mouth 2 (two) times daily. 2 tablets in the am, 1 tablet in pm   RED YEAST RICE 600 MG CAPS    Take 1 capsule by mouth daily.    RIVAROXABAN (XARELTO) 20 MG TABS TABLET    Take 20 mg by mouth daily with supper.   SERTRALINE (ZOLOFT) 50 MG TABLET    Take 3 tablets (150 mg total) by mouth daily.    Review of Systems  Constitutional: Negative.   Respiratory: Positive for cough.   Cardiovascular: Negative.   Genitourinary: Positive for frequency.    Social History  Substance Use Topics  . Smoking status: Never Smoker   . Smokeless tobacco: Never Used  . Alcohol Use: No   Objective:   BP 110/76 mmHg  Pulse 64  Temp(Src) 98.5 F (36.9 C) (Oral)  Resp 16  Wt 235 lb (106.595 kg)  Physical Exam  Constitutional: She is oriented to person, place, and time. She appears well-developed and well-nourished.  Cardiovascular: Normal rate, regular rhythm and normal heart sounds.   Pulmonary/Chest: Effort normal and breath sounds normal.  Neurological: She is alert and oriented to person, place, and time.      Assessment & Plan:     1. Prediabetes Stable. Today, despite being on Prednisone. Recheck in 4 months.  - POCT glycosylated hemoglobin (Hb A1C)  2. Hypothyroidism, unspecified hypothyroidism type Stable. Check labs.  - TSH  3. Hypercholesteremia Check labs today.  - Lipid Panel With LDL/HDL Ratio  4. Essential hypertension Condition is stable. Please continue current medication and  plan of care as noted.  Will check labs.   - CBC with Differential/Platelet - Comprehensive metabolic panel  5. Frequency Will refer to urology to evaluate and treat.  - Ambulatory referral to Urology  6. Cough Check CXR.  Needs further work up if does not continue to improve.   - DG Chest 2 View; Future     Patient was seen and examined by Jerrell Belfast, MD, and note scribed by Lynford Humphrey, [place].   I have reviewed the document for accuracy and completeness and I agree with above. - Jerrell Belfast, MD   Margarita Rana, MD  [facility]

## 2015-07-26 NOTE — Progress Notes (Signed)
Advised  ED 

## 2015-07-27 DIAGNOSIS — E039 Hypothyroidism, unspecified: Secondary | ICD-10-CM | POA: Diagnosis not present

## 2015-07-27 DIAGNOSIS — E7800 Pure hypercholesterolemia, unspecified: Secondary | ICD-10-CM | POA: Diagnosis not present

## 2015-07-27 DIAGNOSIS — I10 Essential (primary) hypertension: Secondary | ICD-10-CM | POA: Diagnosis not present

## 2015-07-27 LAB — URINALYSIS, MICROSCOPIC ONLY
Casts: NONE SEEN /lpf
EPITHELIAL CELLS (NON RENAL): NONE SEEN /HPF (ref 0–10)
RBC, UA: NONE SEEN /hpf (ref 0–?)

## 2015-07-28 LAB — COMPREHENSIVE METABOLIC PANEL
ALBUMIN: 3.5 g/dL (ref 3.5–4.8)
ALT: 17 IU/L (ref 0–32)
AST: 19 IU/L (ref 0–40)
Albumin/Globulin Ratio: 1 — ABNORMAL LOW (ref 1.2–2.2)
Alkaline Phosphatase: 66 IU/L (ref 39–117)
BUN / CREAT RATIO: 19 (ref 12–28)
BUN: 14 mg/dL (ref 8–27)
Bilirubin Total: 0.4 mg/dL (ref 0.0–1.2)
CALCIUM: 9.5 mg/dL (ref 8.7–10.3)
CO2: 26 mmol/L (ref 18–29)
CREATININE: 0.72 mg/dL (ref 0.57–1.00)
Chloride: 99 mmol/L (ref 96–106)
GFR, EST AFRICAN AMERICAN: 96 mL/min/{1.73_m2} (ref >59)
GFR, EST NON AFRICAN AMERICAN: 83 mL/min/{1.73_m2} (ref >59)
GLOBULIN, TOTAL: 3.4 g/dL (ref 1.5–4.5)
Glucose: 96 mg/dL (ref 65–99)
Potassium: 4.6 mmol/L (ref 3.5–5.2)
SODIUM: 142 mmol/L (ref 134–144)
TOTAL PROTEIN: 6.9 g/dL (ref 6.0–8.5)

## 2015-07-28 LAB — CBC WITH DIFFERENTIAL/PLATELET
Basophils Absolute: 0 10*3/uL (ref 0.0–0.2)
Basos: 0 %
EOS (ABSOLUTE): 0.2 10*3/uL (ref 0.0–0.4)
EOS: 2 %
HEMATOCRIT: 43.7 % (ref 34.0–46.6)
HEMOGLOBIN: 14.2 g/dL (ref 11.1–15.9)
IMMATURE GRANS (ABS): 0.2 10*3/uL — AB (ref 0.0–0.1)
IMMATURE GRANULOCYTES: 1 %
LYMPHS ABS: 3.4 10*3/uL — AB (ref 0.7–3.1)
LYMPHS: 27 %
MCH: 30.5 pg (ref 26.6–33.0)
MCHC: 32.5 g/dL (ref 31.5–35.7)
MCV: 94 fL (ref 79–97)
MONOCYTES: 7 %
Monocytes Absolute: 0.9 10*3/uL (ref 0.1–0.9)
Neutrophils Absolute: 7.9 10*3/uL — ABNORMAL HIGH (ref 1.4–7.0)
Neutrophils: 63 %
Platelets: 381 10*3/uL — ABNORMAL HIGH (ref 150–379)
RBC: 4.66 x10E6/uL (ref 3.77–5.28)
RDW: 14.4 % (ref 12.3–15.4)
WBC: 12.5 10*3/uL — AB (ref 3.4–10.8)

## 2015-07-28 LAB — LIPID PANEL WITH LDL/HDL RATIO
Cholesterol, Total: 163 mg/dL (ref 100–199)
HDL: 42 mg/dL (ref >39)
LDL CALC: 103 mg/dL — AB (ref 0–99)
LDL/HDL RATIO: 2.5 ratio (ref 0.0–3.2)
Triglycerides: 90 mg/dL (ref 0–149)
VLDL CHOLESTEROL CAL: 18 mg/dL (ref 5–40)

## 2015-07-28 LAB — URINE CULTURE

## 2015-07-28 LAB — TSH: TSH: 2.03 u[IU]/mL (ref 0.450–4.500)

## 2015-08-06 DIAGNOSIS — Z96653 Presence of artificial knee joint, bilateral: Secondary | ICD-10-CM | POA: Diagnosis not present

## 2015-08-06 DIAGNOSIS — M25562 Pain in left knee: Secondary | ICD-10-CM | POA: Diagnosis not present

## 2015-08-06 DIAGNOSIS — M25561 Pain in right knee: Secondary | ICD-10-CM | POA: Diagnosis not present

## 2015-08-10 DIAGNOSIS — Z79899 Other long term (current) drug therapy: Secondary | ICD-10-CM | POA: Diagnosis not present

## 2015-08-10 DIAGNOSIS — M0579 Rheumatoid arthritis with rheumatoid factor of multiple sites without organ or systems involvement: Secondary | ICD-10-CM | POA: Diagnosis not present

## 2015-08-24 ENCOUNTER — Other Ambulatory Visit: Payer: Self-pay | Admitting: Family Medicine

## 2015-08-25 ENCOUNTER — Ambulatory Visit: Payer: Medicare Other | Admitting: Urology

## 2015-09-27 DIAGNOSIS — M0579 Rheumatoid arthritis with rheumatoid factor of multiple sites without organ or systems involvement: Secondary | ICD-10-CM | POA: Diagnosis not present

## 2015-09-29 ENCOUNTER — Encounter: Payer: Self-pay | Admitting: Urology

## 2015-09-29 ENCOUNTER — Ambulatory Visit (INDEPENDENT_AMBULATORY_CARE_PROVIDER_SITE_OTHER): Payer: Medicare Other | Admitting: Urology

## 2015-09-29 VITALS — BP 96/61 | HR 82 | Ht 64.0 in | Wt 249.9 lb

## 2015-09-29 DIAGNOSIS — N3941 Urge incontinence: Secondary | ICD-10-CM | POA: Diagnosis not present

## 2015-09-29 DIAGNOSIS — R350 Frequency of micturition: Secondary | ICD-10-CM | POA: Diagnosis not present

## 2015-09-29 DIAGNOSIS — R3129 Other microscopic hematuria: Secondary | ICD-10-CM

## 2015-09-29 LAB — URINALYSIS, COMPLETE
BILIRUBIN UA: NEGATIVE
GLUCOSE, UA: NEGATIVE
KETONES UA: NEGATIVE
NITRITE UA: NEGATIVE
Protein, UA: NEGATIVE
SPEC GRAV UA: 1.02 (ref 1.005–1.030)
Urobilinogen, Ur: 1 mg/dL (ref 0.2–1.0)
pH, UA: 5.5 (ref 5.0–7.5)

## 2015-09-29 LAB — MICROSCOPIC EXAMINATION: Bacteria, UA: NONE SEEN

## 2015-09-29 LAB — BLADDER SCAN AMB NON-IMAGING: SCAN RESULT: 12

## 2015-09-29 MED ORDER — SOLIFENACIN SUCCINATE 10 MG PO TABS
10.0000 mg | ORAL_TABLET | Freq: Every day | ORAL | 0 refills | Status: DC
Start: 2015-09-29 — End: 2016-05-02

## 2015-09-29 NOTE — Progress Notes (Signed)
09/29/2015 2:18 PM   Glenda Williams 14-Dec-1940 [id number]  Referring provider: Margarita Rana, MD [address] [place], [place] [address]  Chief Complaint  Patient presents with  . New Patient (Initial Visit)    Urinary frequency    HPI: 75 yo F who was referred for further work up further work up of urinary frequency; urgency.  She reports today that she voids ~45 min - 1 hour in order to avoid incontinence episdoes. She also has issues holding until she can reach the bathroom on time and does have trouble getting her pants down due to arthritis which sometimes results in incontience.  She wears 2 ppd.  She has stopped wearing light colored pants as well.   She gets up 1-2 times per night to void.    She denies any SUI.  No leaking with laughing, coughing, sneezing.  She does take HCTZ but no lasix.  She does drink 1-2 glasses of tea daily (iced), 1 cup decaf coffee in the AM and water.  She occasionally will drink a diet soda.    This has been going on for ~6-12 months.    She has previous Urologist in the remote past for recurrent UTIs which has resolved over the decade.  Last UTI 3-4 years ago.    She also has incidental microscopic hematuria today.   No previous UA for reviewed today.  She does have a history of Afib on Xeralto.    PVR today 12 cc.     PMH: Past Medical History:  Diagnosis Date  . A-fib ([place])   . Arthritis   . GERD (gastroesophageal reflux disease)   . Heart disease   . Rheumatoid arthritis ([place])   . Thyroid disease   . Tremors of nervous system     Surgical History: Past Surgical History:  Procedure Laterality Date  . BREAST BIOPSY Left 1988   Benign  . BREAST BIOPSY Left 1987   Benign  . CARPAL TUNNEL RELEASE Bilateral   . CHOLECYSTECTOMY     Dr. Bary Castilla  . COLONOSCOPY  2006  . JOINT REPLACEMENT Bilateral    knees  . REPLACEMENT TOTAL KNEE Left 2005  . REPLACEMENT TOTAL KNEE Right 1998    Home Medications:    Medication  List       Accurate as of 09/29/15  2:18 PM. Always use your most recent med list.          cetirizine 10 MG tablet Commonly known as:  ZYRTEC Take 10 mg by mouth daily.   Fish Oil 1000 MG Caps Take 1 capsule by mouth daily.   folic acid 1 MG tablet Commonly known as:  FOLVITE Take 1 mg by mouth daily.   hydrochlorothiazide 12.5 MG tablet Commonly known as:  HYDRODIURIL Take 1 tablet by mouth  daily   levothyroxine 75 MCG tablet Commonly known as:  SYNTHROID, LEVOTHROID Take 1 tablet by mouth  daily   methotrexate 2.5 MG tablet Commonly known as:  RHEUMATREX 8 tabs by mouth weekly   omeprazole 20 MG capsule Commonly known as:  PRILOSEC TAKE 1 CAPSULE BY MOUTH 2  TIMES DAILY BEFORE A MEAL.   potassium chloride SA 20 MEQ tablet Commonly known as:  K-DUR,KLOR-CON Take 1 tablet by mouth  daily   predniSONE 5 MG tablet Commonly known as:  DELTASONE   propranolol 40 MG tablet Commonly known as:  INDERAL Take by mouth 2 (two) times daily. 2 tablets in the am, 1 tablet  in pm   Red Yeast Rice 600 MG Caps Take 1 capsule by mouth daily.   REMICADE IV Inject into the vein. Every 6-8 weeks   rivaroxaban 20 MG Tabs tablet Commonly known as:  XARELTO Take 20 mg by mouth daily with supper.   sertraline 50 MG tablet Commonly known as:  ZOLOFT Take 3 tablets (150 mg total) by mouth daily.   solifenacin 10 MG tablet Commonly known as:  VESICARE Take 1 tablet (10 mg total) by mouth daily.       Allergies:  Allergies  Allergen Reactions  . Penicillins Rash    Family History: Family History  Problem Relation Age of Onset  . Stroke Mother   . Hypertension Mother   . Heart disease Father   . Arthritis Father   . Parkinson's disease Sister   . Parkinson's disease Brother   . Breast cancer Neg Hx   . Bladder Cancer Neg Hx   . Kidney cancer Neg Hx     Social History:  reports that she has never smoked. She has never used smokeless tobacco. She reports that  she does not drink alcohol or use drugs.  ROS: UROLOGY Frequent Urination?: Yes Hard to postpone urination?: Yes Burning/pain with urination?: No Get up at night to urinate?: Yes Leakage of urine?: No Urine stream starts and stops?: No Trouble starting stream?: No Do you have to strain to urinate?: No Blood in urine?: No Urinary tract infection?: No Sexually transmitted disease?: No Injury to kidneys or bladder?: No Painful intercourse?: No Weak stream?: No Currently pregnant?: No Vaginal bleeding?: No Last menstrual period?: n  Gastrointestinal Nausea?: No Vomiting?: Yes Indigestion/heartburn?: No Diarrhea?: No Constipation?: No  Constitutional Fever: No Night sweats?: No Weight loss?: No Fatigue?: Yes  Skin Skin rash/lesions?: No Itching?: Yes  Eyes Blurred vision?: No Double vision?: No  Ears/Nose/Throat Sore throat?: No Sinus problems?: No  Hematologic/Lymphatic Swollen glands?: No Easy bruising?: No  Cardiovascular Leg swelling?: No Chest pain?: No  Respiratory Cough?: No Shortness of breath?: No  Endocrine Excessive thirst?: No  Musculoskeletal Back pain?: Yes Joint pain?: Yes  Neurological Headaches?: No Dizziness?: No  Psychologic Depression?: No Anxiety?: Yes  Physical Exam: BP 96/61 (BP Location: Left Arm, Patient Position: Sitting, Cuff Size: Normal)   Pulse 82   Ht 5\' 4"  (1.626 m)   Wt 249 lb 14.4 oz (113.4 kg)   BMI 42.90 kg/m   Constitutional:  Alert and oriented, No acute distress. HEENT: [place] AT, moist mucus membranes.  Trachea midline, no masses. Cardiovascular: No clubbing, cyanosis.  Bilateral LE edema with varicose veins.   Respiratory: Normal respiratory effort, no increased work of breathing. GI: Abdomen is soft, nontender, nondistended, no abdominal masses.  Obese.   GU: No CVA tenderness.  Skin: No rashes, bruises or suspicious lesions. Lymph: No cervical or inguinal adenopathy. Neurologic: Grossly intact,  no focal deficits, moving all 4 extremities. Psychiatric: Normal mood and affect.  Laboratory Data: Lab Results  Component Value Date   WBC 12.5 (H) 07/27/2015   HGB 15.3 07/14/2014   HCT 43.7 07/27/2015   MCV 94 07/27/2015   PLT 381 (H) 07/27/2015    Lab Results  Component Value Date   CREATININE 0.72 07/27/2015    Lab Results  Component Value Date   HGBA1C 5.9 07/26/2015    Urinalysis UA reviewed, 3-10 RBC/ HPF.  Pertinent Imaging: Results for orders placed or performed in visit on 09/29/15  Bladder Scan (Post Void Residual) in office  Result  Value Ref Range   Scan Result 12     Assessment & Plan:    1. Urinary frequency Behavioral modification discussed with timed voiding and avoidance of bladder irritants Trial of Vesicare 10 mg x 30 days (samples given)- side effects reviewed Advised to call in few weeks if not effective or side effects RTC 6 weeks for PVR, symptoms recheck - Urinalysis, Complete - Bladder Scan (Post Void Residual) in office - solifenacin (VESICARE) 10 MG tablet; Take 1 tablet (10 mg total) by mouth daily.  Dispense: 30 tablet; Refill: 0  2. Urge incontinence As above  3. Microscopic hematuria Recheck UA next visit, does not currently meet criteria for hematuria work up   Return in about 6 weeks (around 11/10/2015) for symptoms recheck, PVR.  Hollice Espy, MD  [facility] [address], [place], [place] [address] [phone]

## 2015-10-20 DIAGNOSIS — Z23 Encounter for immunization: Secondary | ICD-10-CM | POA: Diagnosis not present

## 2015-10-20 NOTE — Telephone Encounter (Signed)
error 

## 2015-10-29 DIAGNOSIS — G250 Essential tremor: Secondary | ICD-10-CM | POA: Diagnosis not present

## 2015-11-09 ENCOUNTER — Other Ambulatory Visit: Payer: Self-pay

## 2015-11-09 DIAGNOSIS — Z79899 Other long term (current) drug therapy: Secondary | ICD-10-CM | POA: Diagnosis not present

## 2015-11-09 DIAGNOSIS — M0579 Rheumatoid arthritis with rheumatoid factor of multiple sites without organ or systems involvement: Secondary | ICD-10-CM | POA: Diagnosis not present

## 2015-11-10 ENCOUNTER — Ambulatory Visit (INDEPENDENT_AMBULATORY_CARE_PROVIDER_SITE_OTHER): Payer: Medicare Other | Admitting: Urology

## 2015-11-10 ENCOUNTER — Encounter: Payer: Self-pay | Admitting: Urology

## 2015-11-10 VITALS — Ht 65.0 in | Wt 253.0 lb

## 2015-11-10 DIAGNOSIS — N3941 Urge incontinence: Secondary | ICD-10-CM | POA: Diagnosis not present

## 2015-11-10 DIAGNOSIS — R350 Frequency of micturition: Secondary | ICD-10-CM | POA: Diagnosis not present

## 2015-11-10 DIAGNOSIS — R3129 Other microscopic hematuria: Secondary | ICD-10-CM | POA: Diagnosis not present

## 2015-11-10 LAB — BLADDER SCAN AMB NON-IMAGING

## 2015-11-10 NOTE — Progress Notes (Signed)
11/10/2015 5:00 PM   Glenda Williams [birth year] [id number]  Referring provider: Margarita Rana, MD [address] [place], [place] [address]  Chief Complaint  Patient presents with  . Urinary Frequency    6wk    HPI: 75 yo F who was referred for further work up further work up of urinary frequency; urgency.  She denies any SUI.    At last visit, she was given Vesicare 5 mg.  This seems to be working well.  In the last month, she has only had 2-3 accidents where she could not get to the bathroom on time.  This down from daily.  Her frequency has also improved to nocturia x2 down from 5x.   Overall she is very pleased.     She has experience dry month and constipation related to this.  She has not tried taking stool softeners. She is only minimally bothered by these symptoms.  She also has incidental microscopic hematuria last visit.   No previous UA for reviewed today.  She does have a history of Afib on Xeralto.    PVR today 0.     PMH: Past Medical History:  Diagnosis Date  . A-fib ([place])   . Arthritis   . GERD (gastroesophageal reflux disease)   . Heart disease   . Rheumatoid arthritis ([place])   . Thyroid disease   . Tremors of nervous system     Surgical History: Past Surgical History:  Procedure Laterality Date  . BREAST BIOPSY Left 1988   Benign  . BREAST BIOPSY Left 1987   Benign  . CARPAL TUNNEL RELEASE Bilateral   . CHOLECYSTECTOMY     Dr. Bary Castilla  . COLONOSCOPY  2006  . JOINT REPLACEMENT Bilateral    knees  . REPLACEMENT TOTAL KNEE Left 2005  . REPLACEMENT TOTAL KNEE Right 1998    Home Medications:    Medication List       Accurate as of 11/10/15  5:00 PM. Always use your most recent med list.          Fish Oil 1000 MG Caps Take 1 capsule by mouth daily.   folic acid 1 MG tablet Commonly known as:  FOLVITE Take 1 mg by mouth daily.   hydrochlorothiazide 12.5 MG tablet Commonly known as:  HYDRODIURIL Take 1 tablet by mouth   daily   levothyroxine 75 MCG tablet Commonly known as:  SYNTHROID, LEVOTHROID Take 1 tablet by mouth  daily   methotrexate 2.5 MG tablet Commonly known as:  RHEUMATREX 8 tabs by mouth weekly   omeprazole 20 MG capsule Commonly known as:  PRILOSEC TAKE 1 CAPSULE BY MOUTH 2  TIMES DAILY BEFORE A MEAL.   potassium chloride SA 20 MEQ tablet Commonly known as:  K-DUR,KLOR-CON Take 1 tablet by mouth  daily   propranolol 40 MG tablet Commonly known as:  INDERAL Take by mouth 2 (two) times daily. 2 tablets in the am, 1 tablet in pm   Red Yeast Rice 600 MG Caps Take 1 capsule by mouth daily.   REMICADE IV Inject into the vein. Every 6-8 weeks   rivaroxaban 20 MG Tabs tablet Commonly known as:  XARELTO Take 20 mg by mouth daily with supper.   sertraline 50 MG tablet Commonly known as:  ZOLOFT Take 3 tablets (150 mg total) by mouth daily.   solifenacin 10 MG tablet Commonly known as:  VESICARE Take 1 tablet (10 mg total) by mouth daily.  Allergies:  Allergies  Allergen Reactions  . Penicillins Rash    Family History: Family History  Problem Relation Age of Onset  . Stroke Mother   . Hypertension Mother   . Heart disease Father   . Arthritis Father   . Parkinson's disease Sister   . Parkinson's disease Brother   . Breast cancer Neg Hx   . Bladder Cancer Neg Hx   . Kidney cancer Neg Hx     Social History:  reports that she has never smoked. She has never used smokeless tobacco. She reports that she does not drink alcohol or use drugs.  ROS: UROLOGY Frequent Urination?: Yes Hard to postpone urination?: Yes Burning/pain with urination?: No Get up at night to urinate?: Yes Leakage of urine?: Yes Urine stream starts and stops?: No Trouble starting stream?: No Do you have to strain to urinate?: No Blood in urine?: No Urinary tract infection?: No Sexually transmitted disease?: No Injury to kidneys or bladder?: No Painful intercourse?: No Weak stream?:  No Currently pregnant?: No Vaginal bleeding?: No Last menstrual period?: n  Gastrointestinal Nausea?: No Vomiting?: No Indigestion/heartburn?: No Diarrhea?: No Constipation?: No  Constitutional Fever: No Night sweats?: No Weight loss?: No Fatigue?: No  Skin Skin rash/lesions?: No Itching?: Yes  Eyes Blurred vision?: No Double vision?: No  Ears/Nose/Throat Sore throat?: No Sinus problems?: No  Hematologic/Lymphatic Swollen glands?: No Easy bruising?: No  Cardiovascular Leg swelling?: No Chest pain?: No  Respiratory Cough?: No Shortness of breath?: No  Endocrine Excessive thirst?: No  Musculoskeletal Back pain?: Yes Joint pain?: Yes  Neurological Headaches?: No Dizziness?: No  Psychologic Depression?: No Anxiety?: No  Physical Exam: Ht 5\' 5"  (1.651 m)   Wt 253 lb (114.8 kg)   BMI 42.10 kg/m   Constitutional:  Alert and oriented, No acute distress. HEENT: [place] AT, moist mucus membranes.  Trachea midline, no masses. Cardiovascular: No clubbing, cyanosis.  Bilateral LE edema with varicose veins.   Respiratory: Normal respiratory effort, no increased work of breathing. GI: Abdomen is soft, nontender, nondistended, no abdominal masses.  Obese.   GU: No CVA tenderness.  Skin: No rashes, bruises or suspicious lesions. Neurologic: Grossly intact, no focal deficits, moving all 4 extremities. Psychiatric: Normal mood and affect.  Laboratory Data: Lab Results  Component Value Date   WBC 12.5 (H) 07/27/2015   HGB 15.3 07/14/2014   HCT 43.7 07/27/2015   MCV 94 07/27/2015   PLT 381 (H) 07/27/2015    Lab Results  Component Value Date   CREATININE 0.72 07/27/2015    Lab Results  Component Value Date   HGBA1C 5.9 07/26/2015    Urinalysis UA reviewed, see Epic. No microscopic hematuria.    Pertinent Imaging: Results for orders placed or performed in visit on 11/10/15  BLADDER SCAN AMB NON-IMAGING  Result Value Ref Range   Scan Result 60ml      Assessment & Plan:    1. Urinary frequency Behavioral modification discussed with timed voiding and avoidance of bladder irritants Continue Vesicare 10 mg, additional (samples given)-  May change medication pending insurance coverage- she will call us to let us know formulary coverage for anticholenergics Advised to start stool softener  2. Urge incontinence As above  3. Microscopic hematuria  Microscopic hematuria x 1.  Repeat UA negative.   Per AUA guidelines, no hematuria work up indicated at this time Will repeat UA at next visit  Return in about 1 year (around 11/09/2016) for symptoms review, PVR.  Hollice Espy, MD  [facility] [address] [place],  [address] [phone]

## 2015-11-11 LAB — URINALYSIS, COMPLETE
BILIRUBIN UA: NEGATIVE
Glucose, UA: NEGATIVE
Ketones, UA: NEGATIVE
Leukocytes, UA: NEGATIVE
Nitrite, UA: NEGATIVE
PH UA: 5.5 (ref 5.0–7.5)
Protein, UA: NEGATIVE
Specific Gravity, UA: 1.01 (ref 1.005–1.030)
UUROB: 0.2 mg/dL (ref 0.2–1.0)

## 2015-11-11 LAB — MICROSCOPIC EXAMINATION: BACTERIA UA: NONE SEEN

## 2015-11-17 ENCOUNTER — Encounter: Payer: Self-pay | Admitting: Urology

## 2015-11-18 NOTE — Telephone Encounter (Signed)
Lets try Myrbetriq 25 mg.  Have her come and pick up 2 weeks of samples to see if it works before she purchases the medication.    If effective, will send script to pharmacy.  Hollice Espy, MD

## 2015-11-23 ENCOUNTER — Encounter: Payer: Self-pay | Admitting: Family Medicine

## 2015-11-23 ENCOUNTER — Ambulatory Visit (INDEPENDENT_AMBULATORY_CARE_PROVIDER_SITE_OTHER): Payer: Medicare Other | Admitting: Family Medicine

## 2015-11-23 VITALS — BP 110/60 | HR 72 | Temp 98.2°F | Resp 16 | Ht 64.5 in | Wt 229.0 lb

## 2015-11-23 DIAGNOSIS — I4891 Unspecified atrial fibrillation: Secondary | ICD-10-CM

## 2015-11-23 DIAGNOSIS — E039 Hypothyroidism, unspecified: Secondary | ICD-10-CM | POA: Diagnosis not present

## 2015-11-23 DIAGNOSIS — R7303 Prediabetes: Secondary | ICD-10-CM | POA: Diagnosis not present

## 2015-11-23 DIAGNOSIS — I10 Essential (primary) hypertension: Secondary | ICD-10-CM | POA: Diagnosis not present

## 2015-11-23 DIAGNOSIS — F419 Anxiety disorder, unspecified: Secondary | ICD-10-CM | POA: Diagnosis not present

## 2015-11-23 LAB — POCT GLYCOSYLATED HEMOGLOBIN (HGB A1C)
ESTIMATED AVERAGE GLUCOSE: 120
HEMOGLOBIN A1C: 5.8

## 2015-11-23 MED ORDER — SERTRALINE HCL 50 MG PO TABS: 100.0000 mg | ORAL_TABLET | Freq: Every day | ORAL | 1 refills | Status: DC

## 2015-11-23 NOTE — Progress Notes (Signed)
Patient: Glenda Williams Female    DOB: Mar 07, 1940   75 y.o.   MRN: [id number] Visit Date: 11/23/2015  Today's Provider: Lelon Huh, MD   Chief Complaint  Patient presents with  . Follow-up  . Hypertension  . Hypothyroidism   Subjective:    HPI This is a previous patient of Dr. Venia Minks present today as new patient to me to establish care and follow up on chronic medical problems.    Prediabetes: From 07/26/2015-were made. Is avoiding sweets and starchy foods in diet. Had been several rounds of prednisone over the last year, but not anymore.   Hypothyroidism, unspecified hypothyroidism type: Lab Results  Component Value Date   TSH 2.030 07/27/2015      Hypertension, follow-up:  BP Readings from Last 3 Encounters:  11/23/15 110/60  09/29/15 96/61  07/26/15 110/76    She was last seen for hypertension 4 months ago.  BP at that visit was 110/76. Management since that visit includes; no changes.She reports good compliance with treatment. She is not having side effects. none She is exercising. She is adherent to low salt diet.   Outside blood pressures are 110/70 She is experiencing none.  Patient denies none.   Cardiovascular risk factors include prediabetes.  Use of agents associated with hypertension: none.   ----------------------------------------------------------------    Allergies  Allergen Reactions  . Penicillins Rash     Current Outpatient Prescriptions:  .  folic acid (FOLVITE) 1 MG tablet, Take 1 mg by mouth daily., Disp: , Rfl:  .  hydrochlorothiazide (HYDRODIURIL) 12.5 MG tablet, Take 1 tablet by mouth  daily, Disp: 90 tablet, Rfl: 1 .  InFLIXimab (REMICADE IV), Inject into the vein. Every 6-8 weeks, Disp: , Rfl:  .  levothyroxine (SYNTHROID, LEVOTHROID) 75 MCG tablet, Take 1 tablet by mouth  daily, Disp: 90 tablet, Rfl: 1 .  methotrexate (RHEUMATREX) 2.5 MG tablet, 8 tabs by mouth weekly, Disp: , Rfl:  .  Omega-3 Fatty Acids (FISH  OIL) 1000 MG CAPS, Take 1 capsule by mouth daily., Disp: , Rfl:  .  omeprazole (PRILOSEC) 20 MG capsule, TAKE 1 CAPSULE BY MOUTH 2  TIMES DAILY BEFORE A MEAL., Disp: 180 capsule, Rfl: 1 .  potassium chloride SA (K-DUR,KLOR-CON) 20 MEQ tablet, Take 1 tablet by mouth  daily, Disp: 90 tablet, Rfl: 1 .  propranolol (INDERAL) 40 MG tablet, Take by mouth 2 (two) times daily. 2 tablets in the am, 1 tablet in pm, Disp: , Rfl:  .  Red Yeast Rice 600 MG CAPS, Take 1 capsule by mouth daily., Disp: , Rfl:  .  rivaroxaban (XARELTO) 20 MG TABS tablet, Take 20 mg by mouth daily with supper., Disp: , Rfl:  .  sertraline (ZOLOFT) 50 MG tablet, Take 2 tablets (100 mg total) by mouth daily., Disp: 1 tablet, Rfl: 1 .  solifenacin (VESICARE) 10 MG tablet, Take 1 tablet (10 mg total) by mouth daily., Disp: 30 tablet, Rfl: 0  Review of Systems  Constitutional: Negative for appetite change, chills, fatigue and fever.  Respiratory: Negative for chest tightness and shortness of breath.   Cardiovascular: Negative for chest pain and palpitations.  Gastrointestinal: Negative for abdominal pain, nausea and vomiting.  Neurological: Negative for dizziness and weakness.    Social History  Substance Use Topics  . Smoking status: Never Smoker  . Smokeless tobacco: Never Used  . Alcohol use No   Objective:   BP 110/60 (BP Location: Right Arm, Patient Position: Sitting,  Cuff Size: Large)   Pulse 72   Temp 98.2 F (36.8 C) (Oral)   Resp 16   Ht 5' 4.5" (1.638 m)   Wt 229 lb (103.9 kg)   BMI 38.70 kg/m    Fall Risk  11/23/2015 09/04/2014  Falls in the past year? No No   Depression screen PHQ 2/9 11/23/2015  Decreased Interest 0  Down, Depressed, Hopeless 0  PHQ - 2 Score 0    Functional Status Survey: Is the patient deaf or have difficulty hearing?: No Does the patient have difficulty seeing, even when wearing glasses/contacts?: No Does the patient have difficulty concentrating, remembering, or making decisions?:  No Does the patient have difficulty walking or climbing stairs?: Yes Does the patient have difficulty dressing or bathing?: No Does the patient have difficulty doing errands alone such as visiting a doctor's office or shopping?: No   Physical Exam   General Appearance:    Alert, cooperative, no distress  Eyes:    PERRL, conjunctiva/corneas clear, EOM's intact       Lungs:     Clear to auscultation bilaterally, respirations unlabored  Heart:    IRIR. No murmurs.   Neurologic:   Awake, alert, oriented x 3. No apparent focal neurological           defect.       Results for orders placed or performed in visit on 11/23/15  POCT glycosylated hemoglobin (Hb A1C)  Result Value Ref Range   Hemoglobin A1C 5.8    Est. average glucose Bld gHb Est-mCnc 120        Assessment & Plan:     1. Pre-diabetes Likely related to frequent prednisone she had been taking for RA. Is longer requring prednisone and a1c have normalized.  - POCT glycosylated hemoglobin (Hb A1C)  2. Atrial fibrillation, unspecified type ([place]) Asymptomatic. Compliant with medication.  Continue aggressive risk factor modification.    3. Hypothyroidism, unspecified hypothyroidism type Well controlled. .  4. Essential hypertension Well controlled.  Continue current medications.    5. Anxiety She states stressful situations in life have mostly resolved. Will try reducing to 100mg  sertraline for awhile.  - sertraline (ZOLOFT) 50 MG tablet; Take 2 tablets (100 mg total) by mouth daily.  Dispense: 1 tablet; Refill: 1  Return in about 6 months (around 05/22/2016).     The entirety of the information documented in the History of Present Illness, Review of Systems and Physical Exam were personally obtained by me. Portions of this information were initially documented by April M. Sabra Heck, CMA and reviewed by me for thoroughness and accuracy.    Lelon Huh, MD  [facility]

## 2015-11-23 NOTE — Patient Instructions (Signed)
Try reducing dose of sertraline to 2 50mg  tablets a day.

## 2015-11-24 ENCOUNTER — Telehealth: Payer: Self-pay | Admitting: Urology

## 2015-11-24 NOTE — Telephone Encounter (Signed)
Spoke with pt in reference to myrbetriq. Made pt aware there is not a generic of myrbetriq. Pt then requested what to do now. Reinforced with pt to give the medication a try and if the medications works then we will find out a way for her to get the medication. Pt voiced understanding.

## 2015-11-24 NOTE — Telephone Encounter (Signed)
Pt has questions for generic form of Myrbetriq. Pt hasn't started the sample for Myrbetriq yet. [employer] found the generic form for pt and suggested to speak to Nurse or Dr. PT says she does not want to take anything that Dr. Erlene Quan didn't prescribe "they aren't Dr's"  Please advise. Thanks.

## 2015-11-26 DIAGNOSIS — M65312 Trigger thumb, left thumb: Secondary | ICD-10-CM | POA: Diagnosis not present

## 2015-11-26 DIAGNOSIS — M0579 Rheumatoid arthritis with rheumatoid factor of multiple sites without organ or systems involvement: Secondary | ICD-10-CM | POA: Diagnosis not present

## 2015-12-08 ENCOUNTER — Encounter: Payer: Self-pay | Admitting: Urology

## 2015-12-10 ENCOUNTER — Other Ambulatory Visit: Payer: Medicare Other

## 2015-12-10 DIAGNOSIS — N390 Urinary tract infection, site not specified: Secondary | ICD-10-CM | POA: Diagnosis not present

## 2015-12-10 LAB — URINALYSIS, COMPLETE
Bilirubin, UA: NEGATIVE
Glucose, UA: NEGATIVE
Ketones, UA: NEGATIVE
Nitrite, UA: NEGATIVE
PH UA: 6 (ref 5.0–7.5)
PROTEIN UA: NEGATIVE
Specific Gravity, UA: 1.02 (ref 1.005–1.030)
Urobilinogen, Ur: 1 mg/dL (ref 0.2–1.0)

## 2015-12-10 LAB — MICROSCOPIC EXAMINATION: WBC, UA: >30 /hpf — AB (ref 0–?)

## 2015-12-13 LAB — CULTURE, URINE COMPREHENSIVE

## 2015-12-14 ENCOUNTER — Telehealth: Payer: Self-pay

## 2015-12-14 ENCOUNTER — Ambulatory Visit
Admission: RE | Admit: 2015-12-14 | Discharge: 2015-12-14 | Disposition: A | Discharge status: Home / Self Care | Payer: Medicare Other | Source: Ambulatory Visit | Attending: Physician Assistant | Admitting: Physician Assistant

## 2015-12-14 ENCOUNTER — Encounter: Payer: Self-pay | Admitting: Physician Assistant

## 2015-12-14 ENCOUNTER — Ambulatory Visit (INDEPENDENT_AMBULATORY_CARE_PROVIDER_SITE_OTHER): Payer: Medicare Other | Admitting: Physician Assistant

## 2015-12-14 VITALS — BP 124/72 | HR 68 | Temp 97.9°F | Resp 16

## 2015-12-14 DIAGNOSIS — R079 Chest pain, unspecified: Secondary | ICD-10-CM | POA: Diagnosis not present

## 2015-12-14 DIAGNOSIS — M546 Pain in thoracic spine: Secondary | ICD-10-CM

## 2015-12-14 DIAGNOSIS — M5134 Other intervertebral disc degeneration, thoracic region: Secondary | ICD-10-CM | POA: Insufficient documentation

## 2015-12-14 DIAGNOSIS — R0781 Pleurodynia: Secondary | ICD-10-CM

## 2015-12-14 DIAGNOSIS — I700 Atherosclerosis of aorta: Secondary | ICD-10-CM | POA: Diagnosis not present

## 2015-12-14 DIAGNOSIS — W1800XA Striking against unspecified object with subsequent fall, initial encounter: Secondary | ICD-10-CM

## 2015-12-14 DIAGNOSIS — N390 Urinary tract infection, site not specified: Secondary | ICD-10-CM

## 2015-12-14 DIAGNOSIS — S299XXA Unspecified injury of thorax, initial encounter: Secondary | ICD-10-CM | POA: Diagnosis not present

## 2015-12-14 MED ORDER — SULFAMETHOXAZOLE-TRIMETHOPRIM 800-160 MG PO TABS: 1.0000 | ORAL_TABLET | Freq: Two times a day (BID) | ORAL | 0 refills | Status: AC

## 2015-12-14 NOTE — Telephone Encounter (Signed)
-----   Message from Hollice Espy, MD sent at 12/14/2015  8:30 AM EDT ----- Please treat +UCx with Bactrim DS x 3 days.  Hollice Espy, MD

## 2015-12-14 NOTE — Telephone Encounter (Signed)
Spoke with pt husband and made aware pt has a +ucx. Made aware abx were sent to pharmacy. Husband voiced understanding.

## 2015-12-14 NOTE — Patient Instructions (Signed)
Rib Contusion A rib contusion is a deep bruise on your rib area. Contusions are the result of a blunt trauma that causes bleeding and injury to the tissues under the skin. A rib contusion may involve bruising of the ribs and of the skin and muscles in the area. The skin overlying the contusion may turn blue, purple, or yellow. Minor injuries will give you a painless contusion, but more severe contusions may stay painful and swollen for a few weeks. CAUSES  A contusion is usually caused by a blow, trauma, or direct force to an area of the body. This often occurs while playing contact sports. SYMPTOMS  Swelling and redness of the injured area.  Discoloration of the injured area.  Tenderness and soreness of the injured area.  Pain with or without movement. DIAGNOSIS  The diagnosis can be made by taking a medical history and performing a physical exam. An X-ray, CT scan, or MRI may be needed to determine if there were any associated injuries, such as broken bones (fractures) or internal injuries. TREATMENT  Often, the best treatment for a rib contusion is rest. Icing or applying cold compresses to the injured area may help reduce swelling and inflammation. Deep breathing exercises may be recommended to reduce the risk of partial lung collapse and pneumonia. Over-the-counter or prescription medicines may also be recommended for pain control. HOME CARE INSTRUCTIONS   Apply ice to the injured area:  Put ice in a plastic bag.  Place a towel between your skin and the bag.  Leave the ice on for 20 minutes, 2-3 times per day.  Take medicines only as directed by your health care provider.  Rest the injured area. Avoid strenuous activity and any activities or movements that cause pain. Be careful during activities and avoid bumping the injured area.  Perform deep-breathing exercises as directed by your health care provider.  Do not lift anything that is heavier than 5 lb (2.3 kg) until your  health care provider approves.  Do not use any tobacco products, including cigarettes, chewing tobacco, or electronic cigarettes. If you need help quitting, ask your health care provider. SEEK MEDICAL CARE IF:   You have increased bruising or swelling.  You have pain that is not controlled with treatment.  You have a fever. SEEK IMMEDIATE MEDICAL CARE IF:   You have difficulty breathing or shortness of breath.  You develop a continual cough, or you cough up thick or bloody sputum.  You feel sick to your stomach (nauseous), you throw up (vomit), or you have abdominal pain.   This information is not intended to replace advice given to you by your health care provider. Make sure you discuss any questions you have with your health care provider.   Document Released: 11/15/2000 Document Revised: 03/13/2014 Document Reviewed: 12/02/2013 Elsevier Interactive Patient Education 2016 [employer]  

## 2015-12-14 NOTE — Progress Notes (Signed)
Patient: Glenda Williams Female    DOB: 09/13/1940   75 y.o.   MRN: [id number] Visit Date: 12/14/2015  Today's Provider: Trinna Post, PA-C   Chief Complaint  Patient presents with  . Fall    Tripped over a throw rug this morning.  Left rib pain.     Subjective:    Fall  The accident occurred 3 to 6 hours ago. The fall occurred while walking. She fell from a height of 1 to 2 ft (pt fell onto her computer desk). She landed on carpet. There was no blood loss. The pain is at a severity of 5/10 (With movement it goes as high as 9/10). The symptoms are aggravated by rotation (Deep breathing). Pertinent negatives include no headaches or loss of consciousness.   Patient tripped over a rug and landed on the floor and crashed into her glass coffee table this morning at 9AM. She reports she fell onto her knees. She denies hitting her head, headache, open wounds, bleeding, visual change, amnesia. Reports some pain in her left upper ribs. Able to walk immediately after accident.     Allergies  Allergen Reactions  . Penicillins Rash     Current Outpatient Prescriptions:  .  folic acid (FOLVITE) 1 MG tablet, Take 1 mg by mouth daily., Disp: , Rfl:  .  hydrochlorothiazide (HYDRODIURIL) 12.5 MG tablet, Take 1 tablet by mouth  daily, Disp: 90 tablet, Rfl: 1 .  InFLIXimab (REMICADE IV), Inject into the vein. Every 6-8 weeks, Disp: , Rfl:  .  levothyroxine (SYNTHROID, LEVOTHROID) 75 MCG tablet, Take 1 tablet by mouth  daily, Disp: 90 tablet, Rfl: 1 .  methotrexate (RHEUMATREX) 2.5 MG tablet, 8 tabs by mouth weekly, Disp: , Rfl:  .  Omega-3 Fatty Acids (FISH OIL) 1000 MG CAPS, Take 1 capsule by mouth daily., Disp: , Rfl:  .  omeprazole (PRILOSEC) 20 MG capsule, TAKE 1 CAPSULE BY MOUTH 2  TIMES DAILY BEFORE A MEAL., Disp: 180 capsule, Rfl: 1 .  potassium chloride SA (K-DUR,KLOR-CON) 20 MEQ tablet, Take 1 tablet by mouth  daily, Disp: 90 tablet, Rfl: 1 .  propranolol (INDERAL) 40 MG tablet,  Take by mouth 2 (two) times daily. 2 tablets in the am, 1 tablet in pm, Disp: , Rfl:  .  Red Yeast Rice 600 MG CAPS, Take 1 capsule by mouth daily., Disp: , Rfl:  .  rivaroxaban (XARELTO) 20 MG TABS tablet, Take 20 mg by mouth daily with supper., Disp: , Rfl:  .  sertraline (ZOLOFT) 50 MG tablet, Take 2 tablets (100 mg total) by mouth daily., Disp: 1 tablet, Rfl: 1 .  solifenacin (VESICARE) 10 MG tablet, Take 1 tablet (10 mg total) by mouth daily., Disp: 30 tablet, Rfl: 0 .  sulfamethoxazole-trimethoprim (BACTRIM DS,SEPTRA DS) 800-160 MG tablet, Take 1 tablet by mouth 2 (two) times daily., Disp: 6 tablet, Rfl: 0  Review of Systems  Constitutional: Negative.   Respiratory: Negative.   Musculoskeletal: Positive for arthralgias (Left rib pain). Negative for back pain, gait problem, joint swelling, myalgias, neck pain and neck stiffness.  Neurological: Negative for loss of consciousness and headaches.    Social History  Substance Use Topics  . Smoking status: Never Smoker  . Smokeless tobacco: Never Used  . Alcohol use No   Objective:   BP 124/72 (BP Location: Right Wrist, Patient Position: Sitting, Cuff Size: Normal)   Pulse 68   Temp 97.9 F (36.6 C) (Oral)  Resp 16   Physical Exam  Constitutional: She is oriented to person, place, and time. She appears well-developed and well-nourished. No distress.  HENT:  Head: Normocephalic and atraumatic.  Eyes: Pupils are equal, round, and reactive to light.  Neck: Normal range of motion. Neck supple.  Cardiovascular: Normal rate, regular rhythm and normal heart sounds.   Pulmonary/Chest: Effort normal and breath sounds normal. No respiratory distress. She has no wheezes. She has no rales.     She exhibits tenderness and laceration. She exhibits no deformity and no swelling.    Musculoskeletal: Normal range of motion. She exhibits tenderness. She exhibits no edema or deformity.  Neurological: She is alert and oriented to person, place,  and time. No cranial nerve deficit.  Skin: Skin is warm and dry. She is not diaphoretic.        Assessment & Plan:      Problem List Items Addressed This Visit    None    Visit Diagnoses    Fall against object    -  Primary   Relevant Orders   DG Chest 2 View   DG Thoracic Spine W/Swimmers   Rib pain on left side       Relevant Orders   DG Chest 2 View   DG Thoracic Spine W/Swimmers   Thoracic spine pain         Patient evaluated as above. Patient does not want opioid pain relief. Advised against NSAIDs due to age and GERD history. Patient reports she usually takes Tylenol and is willing to take that for this injury. Counseled on safe Tylenol limits. Counseled patient on taking adequate breaths, and splinting with pillow for coughing. Patient UTD on tetanus. Will contact patient with image results.    The entirety of the information documented in the History of Present Illness, Review of Systems and Physical Exam were personally obtained by me. Portions of this information were initially documented by Ashley Royalty, CMA and reviewed by me for thoroughness and accuracy.   Return if symptoms worsen or fail to improve.   Patient Instructions  Rib Contusion A rib contusion is a deep bruise on your rib area. Contusions are the result of a blunt trauma that causes bleeding and injury to the tissues under the skin. A rib contusion may involve bruising of the ribs and of the skin and muscles in the area. The skin overlying the contusion may turn blue, purple, or yellow. Minor injuries will give you a painless contusion, but more severe contusions may stay painful and swollen for a few weeks. CAUSES  A contusion is usually caused by a blow, trauma, or direct force to an area of the body. This often occurs while playing contact sports. SYMPTOMS  Swelling and redness of the injured area.  Discoloration of the injured area.  Tenderness and soreness of the injured area.  Pain with or  without movement. DIAGNOSIS  The diagnosis can be made by taking a medical history and performing a physical exam. An X-ray, CT scan, or MRI may be needed to determine if there were any associated injuries, such as broken bones (fractures) or internal injuries. TREATMENT  Often, the best treatment for a rib contusion is rest. Icing or applying cold compresses to the injured area may help reduce swelling and inflammation. Deep breathing exercises may be recommended to reduce the risk of partial lung collapse and pneumonia. Over-the-counter or prescription medicines may also be recommended for pain control. HOME CARE INSTRUCTIONS   Apply  ice to the injured area:  Put ice in a plastic bag.  Place a towel between your skin and the bag.  Leave the ice on for 20 minutes, 2-3 times per day.  Take medicines only as directed by your health care provider.  Rest the injured area. Avoid strenuous activity and any activities or movements that cause pain. Be careful during activities and avoid bumping the injured area.  Perform deep-breathing exercises as directed by your health care provider.  Do not lift anything that is heavier than 5 lb (2.3 kg) until your health care provider approves.  Do not use any tobacco products, including cigarettes, chewing tobacco, or electronic cigarettes. If you need help quitting, ask your health care provider. SEEK MEDICAL CARE IF:   You have increased bruising or swelling.  You have pain that is not controlled with treatment.  You have a fever. SEEK IMMEDIATE MEDICAL CARE IF:   You have difficulty breathing or shortness of breath.  You develop a continual cough, or you cough up thick or bloody sputum.  You feel sick to your stomach (nauseous), you throw up (vomit), or you have abdominal pain.   This information is not intended to replace advice given to you by your health care provider. Make sure you discuss any questions you have with your health care  provider.   Document Released: 11/15/2000 Document Revised: 03/13/2014 Document Reviewed: 12/02/2013 Elsevier Interactive Patient Education 2016 [place], [facility]

## 2015-12-15 ENCOUNTER — Telehealth: Payer: Self-pay

## 2015-12-15 NOTE — Telephone Encounter (Signed)
-----   Message from Trinna Post, [place] sent at 12/15/2015 11:06 AM EDT ----- Patient's CXR and thoracic spine XrAY did not show evidence of fracture. Continue with pain management and followup is PRN. Please advise patient, thank yoU!

## 2015-12-15 NOTE — Telephone Encounter (Signed)
Patient advised.

## 2015-12-16 ENCOUNTER — Other Ambulatory Visit: Payer: Self-pay | Admitting: Family Medicine

## 2015-12-16 DIAGNOSIS — E039 Hypothyroidism, unspecified: Secondary | ICD-10-CM

## 2015-12-16 DIAGNOSIS — I10 Essential (primary) hypertension: Secondary | ICD-10-CM

## 2015-12-21 ENCOUNTER — Ambulatory Visit
Admission: RE | Admit: 2015-12-21 | Discharge: 2015-12-21 | Disposition: A | Discharge status: Home / Self Care | Payer: Medicare Other | Source: Ambulatory Visit | Attending: Physician Assistant | Admitting: Physician Assistant

## 2015-12-21 ENCOUNTER — Encounter: Payer: Self-pay | Admitting: Physician Assistant

## 2015-12-21 ENCOUNTER — Ambulatory Visit (INDEPENDENT_AMBULATORY_CARE_PROVIDER_SITE_OTHER): Payer: Medicare Other | Admitting: Physician Assistant

## 2015-12-21 VITALS — BP 122/68 | HR 88 | Temp 98.0°F

## 2015-12-21 DIAGNOSIS — Z9889 Other specified postprocedural states: Secondary | ICD-10-CM | POA: Diagnosis not present

## 2015-12-21 DIAGNOSIS — M25562 Pain in left knee: Secondary | ICD-10-CM

## 2015-12-21 DIAGNOSIS — W19XXXD Unspecified fall, subsequent encounter: Secondary | ICD-10-CM | POA: Diagnosis not present

## 2015-12-21 DIAGNOSIS — S8992XA Unspecified injury of left lower leg, initial encounter: Secondary | ICD-10-CM | POA: Diagnosis not present

## 2015-12-21 DIAGNOSIS — M25561 Pain in right knee: Secondary | ICD-10-CM | POA: Diagnosis not present

## 2015-12-21 MED ORDER — HYDROCODONE-ACETAMINOPHEN 2.5-325 MG PO TABS: ORAL_TABLET | ORAL | 0 refills | Status: DC

## 2015-12-21 NOTE — Patient Instructions (Signed)

## 2015-12-21 NOTE — Progress Notes (Signed)
Patient: Glenda Williams Female    DOB: 09-22-1940   75 y.o.   MRN: [id number] Visit Date: 12/21/2015  Today's Provider: Trinna Post, PA-C   Chief Complaint  Patient presents with  . Leg Pain    Left leg...may  have been secondary to her fall last week.    Subjective:    Leg Pain   The incident occurred more than 1 week ago (Pt fell in her home one week ago.  She says her knee pain didn't start until about Saturday.  ). The incident occurred at home. The injury mechanism was a fall. The pain is present in the left knee (Does have a history of bilateral knee replacements. ). The quality of the pain is described as burning. The pain has been fluctuating since onset. Associated symptoms include a loss of sensation (below her knee), muscle weakness (Her knee has giving out a few times.) and numbness. Pertinent negatives include no inability to bear weight, loss of motion or tingling. She has tried acetaminophen for the symptoms. The treatment provided no relief.   Patient is 75 y/o on sertraline with hx of bilateral knee replacements presenting with left knee pain after a fall a week ago. with hx of bilateral knee replacements presenting with left knee pain after a fall a week ago. Patient seen last week in clinic for fall against table. At that time only complaining of left rib and back pain. Today, complaining of left knee pain that she describes as burning after exertion ongoing for three days. Reports she walked around a warehouse sale on Saturday and was tired after that. Reports her pain was so bad yesterday that she was in tears. Regular course of Tylenol has not helped. Reports hesitancy to place weight on knee, but denies inability to bear weight. Reports some bruising.      Allergies  Allergen Reactions  . Penicillins Rash     Current Outpatient Prescriptions:  .  folic acid (FOLVITE) 1 MG tablet, Take 1 mg by mouth daily., Disp: , Rfl:  .  hydrochlorothiazide (HYDRODIURIL) 12.5 MG tablet, TAKE 1 TABLET BY MOUTH  DAILY, Disp: 90 tablet, Rfl: 4 .   Hydrocodone-Acetaminophen 2.5-325 MG TABS, Take one pill every 8 hours up to three times a day. Five day prescription, Disp: 15 tablet, Rfl: 0 .  InFLIXimab (REMICADE IV), Inject into the vein. Every 6-8 weeks, Disp: , Rfl:  .  levothyroxine (SYNTHROID, LEVOTHROID) 75 MCG tablet, TAKE 1 TABLET BY MOUTH  DAILY, Disp: 90 tablet, Rfl: 4 .  methotrexate (RHEUMATREX) 2.5 MG tablet, 8 tabs by mouth weekly, Disp: , Rfl:  .  Omega-3 Fatty Acids (FISH OIL) 1000 MG CAPS, Take 1 capsule by mouth daily., Disp: , Rfl:  .  omeprazole (PRILOSEC) 20 MG capsule, TAKE 1 CAPSULE BY MOUTH 2  TIMES DAILY BEFORE A MEAL., Disp: 180 capsule, Rfl: 1 .  potassium chloride SA (K-DUR,KLOR-CON) 20 MEQ tablet, Take 1 tablet by mouth  daily, Disp: 90 tablet, Rfl: 1 .  propranolol (INDERAL) 40 MG tablet, Take by mouth 2 (two) times daily. 2 tablets in the am, 1 tablet in pm, Disp: , Rfl:  .  Red Yeast Rice 600 MG CAPS, Take 1 capsule by mouth daily., Disp: , Rfl:  .  rivaroxaban (XARELTO) 20 MG TABS tablet, Take 20 mg by mouth daily with supper., Disp: , Rfl:  .  sertraline (ZOLOFT) 50 MG tablet, Take 2 tablets (100 mg total) by mouth daily., Disp: 1 tablet, Rfl: 1 .  solifenacin (VESICARE) 10 MG tablet, Take 1 tablet (  10 mg total) by mouth daily., Disp: 30 tablet, Rfl: 0  Review of Systems  Constitutional: Negative.   Musculoskeletal: Positive for arthralgias and joint swelling. Negative for back pain, gait problem, myalgias, neck pain and neck stiffness.  Neurological: Positive for numbness. Negative for tingling.    Social History  Substance Use Topics  . Smoking status: Never Smoker  . Smokeless tobacco: Never Used  . Alcohol use No   Objective:   BP 122/68 (BP Location: Left Wrist, Patient Position: Sitting, Cuff Size: Normal)   Pulse 88   Temp 98 F (36.7 C) (Oral)   Physical Exam  Constitutional: She is oriented to person, place, and time. She appears well-developed and well-nourished. No distress.    Musculoskeletal: Normal range of motion. She exhibits no edema, tenderness or deformity.       Right hip: Normal.       Left hip: Normal.       Right knee: Normal.       Left knee: She exhibits ecchymosis. She exhibits normal range of motion, no swelling, no effusion, no deformity, no laceration, no erythema, normal alignment, no LCL laxity, normal patellar mobility, no bony tenderness, normal meniscus and no MCL laxity. No tenderness found. No medial joint line, no lateral joint line, no MCL and no LCL tenderness noted.       Right ankle: Normal.       Left ankle: Normal.       Legs: Neurological: She is alert and oriented to person, place, and time. She has normal strength and normal reflexes. No sensory deficit.  Neurovascularly in tact. DP pulses 2+ bilaterally. Cap refill < 3s bilaterally. Sensation equal and present bilateral lower extremities.  Skin: Skin is warm and dry.  Psychiatric: She has a normal mood and affect. Her behavior is normal.        Assessment & Plan:      Problem List Items Addressed This Visit    None    Visit Diagnoses    Acute pain of left knee    -  Primary   Relevant Medications   Hydrocodone-Acetaminophen 2.5-325 MG TABS   Other Relevant Orders   DG Knee Complete 4 Views Left   Fall, subsequent encounter       Relevant Orders   DG Knee Complete 4 Views Right     Patient is 75 y/o woman presenting with left knee pain after fall. with left knee pain after fall. Considering bilateral knee replacements, will image both knees. Last [facility] done at [facility] on 08/2015 showed some prosthetic wear but no abnormal misalignment. Will contact patient regarding results. Pain not controlled by max Tylenol limits. Hesitant to prescribe high dose NSAIDs with hx of GERD and age. Concern for serotonin syndrome with Tramadol and sertraline. Will give hydrocodone-acetaminophen as above for five days for acute, severe pain. Patient to call back with any problems.  Return if symptoms worsen or fail to  improve.  The entirety of the information documented in the History of Present Illness, Review of Systems and Physical Exam were personally obtained by me. Portions of this information were initially documented by Ashley Royalty, CMA and reviewed by me for thoroughness and accuracy.    Patient Instructions  Knee Pain Knee pain is a very common symptom and can have many causes. Knee pain often goes away when you follow your health care provider's instructions for relieving pain and discomfort at home. However, knee pain can develop into a condition that needs treatment. Some conditions may include:  Arthritis caused by wear and tear (osteoarthritis).  Arthritis caused by swelling and irritation (rheumatoid arthritis or gout).  A cyst or growth in your knee.  An infection in your knee joint.  An injury that will not heal.  Damage, swelling, or irritation of the tissues that support your knee (torn ligaments or tendinitis). If your knee pain continues, additional tests may be ordered to diagnose your condition. Tests may include X-rays or other imaging studies of your knee. You may also need to have fluid removed from your knee. Treatment for ongoing knee pain depends on the cause, but treatment may include:  Medicines to relieve pain or swelling.  Steroid injections in your knee.  Physical therapy.  Surgery. HOME CARE INSTRUCTIONS  Take medicines only as directed by your health care provider.  Rest your knee and keep it raised (elevated) while you are resting.  Do not do things that cause or worsen pain.  Avoid high-impact activities or exercises, such as running, jumping rope, or doing jumping jacks.  Apply ice to the knee area:  Put ice in a plastic bag.  Place a towel between your skin and the bag.  Leave the ice on for 20 minutes, 2-3 times a day.  Ask your health care provider if you should wear an elastic knee support.  Keep a pillow under your knee when you  sleep.  Lose weight if you are overweight. Extra weight can put pressure on your knee.  Do not use any tobacco products, including cigarettes, chewing tobacco, or electronic cigarettes. If you need help quitting, ask your health care provider. Smoking may slow the healing of any bone and joint problems that you may have. SEEK MEDICAL CARE IF:  Your knee pain continues, changes, or gets worse.  You have a fever along with knee pain.  Your knee buckles or locks up.  Your knee becomes more swollen. SEEK IMMEDIATE MEDICAL CARE IF:   Your knee joint feels hot to the touch.  You have chest pain or trouble breathing.   This information is not intended to replace advice given to you by your health care provider. Make sure you discuss any questions you have with your health care provider.   Document Released: 12/18/2006 Document Revised: 03/13/2014 Document Reviewed: 10/06/2013 Elsevier Interactive Patient Education 2016 H[place], PA-C  [facility]

## 2015-12-22 ENCOUNTER — Telehealth: Payer: Self-pay

## 2015-12-22 ENCOUNTER — Other Ambulatory Visit: Payer: Self-pay | Admitting: Physician Assistant

## 2015-12-22 DIAGNOSIS — M25562 Pain in left knee: Secondary | ICD-10-CM

## 2015-12-22 MED ORDER — HYDROCODONE-ACETAMINOPHEN 5-325 MG PO TABS: ORAL_TABLET | ORAL | 0 refills | Status: DC

## 2015-12-22 NOTE — Telephone Encounter (Signed)
Left message advising pt.   Thanks,   -Kyelle Urbas  

## 2015-12-22 NOTE — Telephone Encounter (Signed)
-----   Message from Trinna Post, [place] sent at 12/22/2015  8:12 AM EDT ----- Both knee xrays were normal. May take pain medication as discussed and follow up as needed. Please advise, thank you.

## 2015-12-22 NOTE — Telephone Encounter (Signed)
Patient advised and verbally voiced understanding. Patient states the pain medication that was prescribed is not available in a 2.5mg  dosage. Patient states her pharmacy has it in a 5mg  dosage. Patient needs a new prescription for this. Call patient when new prescription is ready for pick up.

## 2015-12-22 NOTE — Progress Notes (Signed)
Patient called reporting pharmacy did not carry 2.5 Norco. Have D/C 2.5mg  and prescribed 5/325 hydrocodone/acet, half to one tablet every 8 hours. Five day rx. No driving with these medications, as they are sedating. Pt is to bring in old Rx to be shredded.

## 2015-12-22 NOTE — Telephone Encounter (Signed)
Have printed new Rx. Patient should bring old hard script to be shredded. Thank you.

## 2015-12-28 DIAGNOSIS — I10 Essential (primary) hypertension: Secondary | ICD-10-CM | POA: Diagnosis not present

## 2015-12-28 DIAGNOSIS — I480 Paroxysmal atrial fibrillation: Secondary | ICD-10-CM | POA: Diagnosis not present

## 2015-12-28 DIAGNOSIS — I4891 Unspecified atrial fibrillation: Secondary | ICD-10-CM | POA: Diagnosis not present

## 2015-12-29 ENCOUNTER — Other Ambulatory Visit: Payer: Self-pay

## 2015-12-29 DIAGNOSIS — N3281 Overactive bladder: Secondary | ICD-10-CM

## 2015-12-29 MED ORDER — MIRABEGRON ER 25 MG PO TB24
25.0000 mg | ORAL_TABLET | Freq: Every day | ORAL | 3 refills | Status: DC
Start: 2015-12-29 — End: 2017-10-31

## 2016-01-03 DIAGNOSIS — M0579 Rheumatoid arthritis with rheumatoid factor of multiple sites without organ or systems involvement: Secondary | ICD-10-CM | POA: Diagnosis not present

## 2016-02-15 DIAGNOSIS — M0579 Rheumatoid arthritis with rheumatoid factor of multiple sites without organ or systems involvement: Secondary | ICD-10-CM | POA: Diagnosis not present

## 2016-02-15 DIAGNOSIS — Z79899 Other long term (current) drug therapy: Secondary | ICD-10-CM | POA: Diagnosis not present

## 2016-02-17 DIAGNOSIS — H02831 Dermatochalasis of right upper eyelid: Secondary | ICD-10-CM | POA: Diagnosis not present

## 2016-03-21 DIAGNOSIS — M25512 Pain in left shoulder: Secondary | ICD-10-CM | POA: Diagnosis not present

## 2016-03-21 DIAGNOSIS — M0579 Rheumatoid arthritis with rheumatoid factor of multiple sites without organ or systems involvement: Secondary | ICD-10-CM | POA: Diagnosis not present

## 2016-03-23 ENCOUNTER — Other Ambulatory Visit: Payer: Self-pay | Admitting: Family Medicine

## 2016-03-23 DIAGNOSIS — F419 Anxiety disorder, unspecified: Secondary | ICD-10-CM

## 2016-03-24 NOTE — Telephone Encounter (Signed)
Please review-aa 

## 2016-03-28 DIAGNOSIS — M0579 Rheumatoid arthritis with rheumatoid factor of multiple sites without organ or systems involvement: Secondary | ICD-10-CM | POA: Diagnosis not present

## 2016-05-02 ENCOUNTER — Encounter: Payer: Self-pay | Admitting: *Deleted

## 2016-05-08 DIAGNOSIS — Z79899 Other long term (current) drug therapy: Secondary | ICD-10-CM | POA: Diagnosis not present

## 2016-05-08 DIAGNOSIS — M0579 Rheumatoid arthritis with rheumatoid factor of multiple sites without organ or systems involvement: Secondary | ICD-10-CM | POA: Diagnosis not present

## 2016-05-12 DIAGNOSIS — G8929 Other chronic pain: Secondary | ICD-10-CM | POA: Diagnosis not present

## 2016-05-12 DIAGNOSIS — M25552 Pain in left hip: Secondary | ICD-10-CM | POA: Diagnosis not present

## 2016-05-12 DIAGNOSIS — M0579 Rheumatoid arthritis with rheumatoid factor of multiple sites without organ or systems involvement: Secondary | ICD-10-CM | POA: Diagnosis not present

## 2016-05-12 DIAGNOSIS — M79605 Pain in left leg: Secondary | ICD-10-CM | POA: Diagnosis not present

## 2016-05-12 DIAGNOSIS — G252 Other specified forms of tremor: Secondary | ICD-10-CM | POA: Diagnosis not present

## 2016-05-12 DIAGNOSIS — M545 Low back pain: Secondary | ICD-10-CM | POA: Diagnosis not present

## 2016-05-18 ENCOUNTER — Telehealth: Payer: Self-pay | Admitting: Urology

## 2016-05-18 NOTE — Telephone Encounter (Signed)
Pt called office stating that she usually gets samples from the office and would like to know if she could get more due to her being [place] by June. Please advise. Thanks

## 2016-05-18 NOTE — Telephone Encounter (Signed)
Spoke with pt in reference to myrbetriq samples. Pt stated that she takes 25mg . Made pt aware currently out of 25mg  but will call when samples arrive. Pt voiced understanding.

## 2016-05-22 NOTE — Discharge Instructions (Signed)
INSTRUCTIONS FOLLOWING OCULOPLASTIC SURGERY °AMY M. FOWLER, MD ° °AFTER YOUR EYE SURGERY, THER ARE MANY THINGS THWIHC YOU, THE PATIENT, CAN DO TO ASSURE THE BEST POSSIBLE RESULT FROM YOUR OPERATION.  THIS SHEET SHOULD BE REFERRED TO WHENEVER QUESTIONS ARISE.  IF THERE ARE ANY QUESTIONS NOT ANSWERED HERE, DO NOT HESITATE TO CALL OUR OFFICE AT [phone] OR [phone].  THERE IS ALWAYS [facility] AVAILABLE TO CALL IF QUESTIONS OR PROBLEMS ARISE. ° °VISION: Your vision may be blurred and out of focus after surgery until you are able to stop using your ointment, swelling resolves and your eye(s) heal. This may take 1 to 2 weeks at the least.  If your vision becomes gradually more dim or dark, this is not normal and you need to call our office immediately. ° °EYE CARE: For the first 48 hours after surgery, use ice packs frequently - “20 minutes on, 20 minutes off” - to help reduce swelling and bruising.  Small bags of frozen peas or corn make good ice packs along with cloths soaked in ice water.  If you are wearing a patch or other type of dressing following surgery, keep this on for the amount of time specified by your doctor.  For the first week following surgery, you will need to treat your stitches with great care.  If is OK to shower, but take care to not allow soapy water to run into your eye(s) to help reduce changes of infection.  You may gently clean the eyelashes and around the eye(s) with cotton balls and sterile water, BUT DO NOT RUB THE STITCHES VIGOROUSLY.  Keeping your stitches moist with ointment will help promote healing with minimal scar formation. ° °ACTIVITY: When you leave the surgery center, you should go home, rest and be inactive.  The eye(s) may feel scratchy and keeping the eyes closed will allow for faster healing.  The first week following surgery, avoid straining (anything making the face turn red) or lifting over 20 pounds.  Additionally, avoid bending which causes your head to go below  your waist.  Using your eyes will NOT harm them, so feel free to read, watch television, use the computer, etc as desired.  Driving depends on each individual, so check with your doctor if you have questions about driving. ° °MEDICATIONS:  You will be given a prescription for an ointment to use 4 times a day on your stitches.  You can use the ointment in your eyes if they feel scratchy or irritated.  If you eyelid(s) don’t close completely when you sleep, put some ointment in your eyes before bedtime. ° °EMERGENCY: If you experience SEVERE EYE PAIN OR HEADACHE UNRELIEVED BY TYLENOL OR PERCOCET, NAUSEA OR VOMITING, WORSENING REDNESS, OR WORSENING VISION (ESPECIALLY VISION THAT WA INITIALLY BETTER) CALL [phone] OR [phone] DURING BUSINESS HOURS OR AFTER HOURS. ° °General Anesthesia, Adult, Care After °These instructions provide you with information about caring for yourself after your procedure. Your health care provider may also give you more specific instructions. Your treatment has been planned according to current medical practices, but problems sometimes occur. Call your health care provider if you have any problems or questions after your procedure. °What can I expect after the procedure? °After the procedure, it is common to have: °· Vomiting. °· A sore throat. °· Mental slowness. °It is common to feel: °· Nauseous. °· Cold or shivery. °· Sleepy. °· Tired. °· Sore or achy, even in parts of your body where you did not have surgery. °Follow   these instructions at home: °For at least 24 hours after the procedure:  °· Do not: °¨ Participate in activities where you could fall or become injured. °¨ Drive. °¨ Use heavy machinery. °¨ Drink alcohol. °¨ Take sleeping pills or medicines that cause drowsiness. °¨ Make important decisions or sign legal documents. °¨ Take care of children on your own. °· Rest. °Eating and drinking  °· If you vomit, drink water, juice, or soup when you can drink without  vomiting. °· Drink enough fluid to keep your urine clear or pale yellow. °· Make sure you have little or no nausea before eating solid foods. °· Follow the diet recommended by your health care provider. °General instructions  °· Have a responsible adult stay with you until you are awake and alert. °· Return to your normal activities as told by your health care provider. Ask your health care provider what activities are safe for you. °· Take over-the-counter and prescription medicines only as told by your health care provider. °· If you smoke, do not smoke without supervision. °· Keep all follow-up visits as told by your health care provider. This is important. °Contact a health care provider if: °· You continue to have nausea or vomiting at home, and medicines are not helpful. °· You cannot drink fluids or start eating again. °· You cannot urinate after 8-12 hours. °· You develop a skin rash. °· You have fever. °· You have increasing redness at the site of your procedure. °Get help right away if: °· You have difficulty breathing. °· You have chest pain. °· You have unexpected bleeding. °· You feel that you are having a life-threatening or urgent problem. °This information is not intended to replace advice given to you by your health care provider. Make sure you discuss any questions you have with your health care provider. °Document Released: 05/29/2000 Document Revised: 07/26/2015 Document Reviewed: 02/04/2015 °Elsevier Interactive Patient Education © 2017 Elsevier Inc. ° °

## 2016-05-23 ENCOUNTER — Ambulatory Visit: Payer: Medicare Other | Admitting: Anesthesiology

## 2016-05-23 ENCOUNTER — Ambulatory Visit: Payer: Medicare Other | Admitting: Family Medicine

## 2016-05-23 ENCOUNTER — Ambulatory Visit
Admission: RE | Admit: 2016-05-23 | Discharge: 2016-05-23 | Disposition: A | Discharge status: Home / Self Care | Payer: Medicare Other | Source: Ambulatory Visit | Attending: Ophthalmology | Admitting: Ophthalmology

## 2016-05-23 ENCOUNTER — Ambulatory Visit
Admission: RE | Disposition: A | Discharge status: Home / Self Care | Payer: Self-pay | Source: Ambulatory Visit | Attending: Ophthalmology

## 2016-05-23 DIAGNOSIS — I10 Essential (primary) hypertension: Secondary | ICD-10-CM | POA: Insufficient documentation

## 2016-05-23 DIAGNOSIS — E039 Hypothyroidism, unspecified: Secondary | ICD-10-CM | POA: Insufficient documentation

## 2016-05-23 DIAGNOSIS — Z9889 Other specified postprocedural states: Secondary | ICD-10-CM | POA: Diagnosis not present

## 2016-05-23 DIAGNOSIS — I4891 Unspecified atrial fibrillation: Secondary | ICD-10-CM | POA: Insufficient documentation

## 2016-05-23 DIAGNOSIS — G709 Myoneural disorder, unspecified: Secondary | ICD-10-CM | POA: Insufficient documentation

## 2016-05-23 DIAGNOSIS — H02831 Dermatochalasis of right upper eyelid: Secondary | ICD-10-CM | POA: Diagnosis not present

## 2016-05-23 DIAGNOSIS — Z7901 Long term (current) use of anticoagulants: Secondary | ICD-10-CM | POA: Insufficient documentation

## 2016-05-23 DIAGNOSIS — K449 Diaphragmatic hernia without obstruction or gangrene: Secondary | ICD-10-CM | POA: Diagnosis not present

## 2016-05-23 DIAGNOSIS — F419 Anxiety disorder, unspecified: Secondary | ICD-10-CM | POA: Diagnosis not present

## 2016-05-23 DIAGNOSIS — Z79899 Other long term (current) drug therapy: Secondary | ICD-10-CM | POA: Insufficient documentation

## 2016-05-23 DIAGNOSIS — R251 Tremor, unspecified: Secondary | ICD-10-CM | POA: Insufficient documentation

## 2016-05-23 DIAGNOSIS — Z880 Allergy status to penicillin: Secondary | ICD-10-CM | POA: Diagnosis not present

## 2016-05-23 DIAGNOSIS — M810 Age-related osteoporosis without current pathological fracture: Secondary | ICD-10-CM | POA: Insufficient documentation

## 2016-05-23 DIAGNOSIS — M069 Rheumatoid arthritis, unspecified: Secondary | ICD-10-CM | POA: Diagnosis not present

## 2016-05-23 DIAGNOSIS — H02834 Dermatochalasis of left upper eyelid: Secondary | ICD-10-CM | POA: Diagnosis not present

## 2016-05-23 DIAGNOSIS — K219 Gastro-esophageal reflux disease without esophagitis: Secondary | ICD-10-CM | POA: Insufficient documentation

## 2016-05-23 HISTORY — PX: BROW LIFT: SHX178

## 2016-05-23 SURGERY — BLEPHAROPLASTY
Anesthesia: Monitor Anesthesia Care | Site: Eye | Laterality: Bilateral | Wound class: Clean

## 2016-05-23 MED ORDER — ALFENTANIL 500 MCG/ML IJ INJ
INJECTION | INTRAMUSCULAR | Status: DC | PRN
  Administered 2016-05-23: 500 ug via INTRAVENOUS

## 2016-05-23 MED ORDER — BSS IO SOLN
INTRAOCULAR | Status: DC | PRN
  Administered 2016-05-23: 4 mL

## 2016-05-23 MED ORDER — OXYCODONE-ACETAMINOPHEN 5-325 MG PO TABS: 1.0000 | ORAL_TABLET | ORAL | 0 refills | Status: DC | PRN

## 2016-05-23 MED ORDER — OXYCODONE HCL 5 MG/5ML PO SOLN: 5.0000 mg | Freq: Once | ORAL | Status: DC | PRN

## 2016-05-23 MED ORDER — MIDAZOLAM HCL 2 MG/2ML IJ SOLN
INTRAMUSCULAR | Status: DC | PRN
  Administered 2016-05-23: 0.5 mg via INTRAVENOUS
  Administered 2016-05-23: .5 mg via INTRAVENOUS
  Administered 2016-05-23 (×2): 0.5 mg via INTRAVENOUS

## 2016-05-23 MED ORDER — OXYCODONE HCL 5 MG PO TABS: 5.0000 mg | ORAL_TABLET | Freq: Once | ORAL | Status: DC | PRN

## 2016-05-23 MED ORDER — LIDOCAINE-EPINEPHRINE 2 %-1:100000 IJ SOLN
INTRAMUSCULAR | Status: DC | PRN
  Administered 2016-05-23: 2 mL via OPHTHALMIC

## 2016-05-23 MED ORDER — ERYTHROMYCIN 5 MG/GM OP OINT
TOPICAL_OINTMENT | OPHTHALMIC | Status: DC | PRN
  Administered 2016-05-23: 1 via OPHTHALMIC

## 2016-05-23 MED ORDER — LACTATED RINGERS IV SOLN
INTRAVENOUS | Status: DC
  Administered 2016-05-23 (×2): via INTRAVENOUS

## 2016-05-23 MED ORDER — ERYTHROMYCIN 5 MG/GM OP OINT: TOPICAL_OINTMENT | OPHTHALMIC | 3 refills | Status: DC

## 2016-05-23 MED ORDER — TETRACAINE HCL 0.5 % OP SOLN
OPHTHALMIC | Status: DC | PRN
  Administered 2016-05-23: 2 [drp] via OPHTHALMIC

## 2016-05-23 MED ORDER — PROMETHAZINE HCL 25 MG/ML IJ SOLN: 6.2500 mg | INTRAMUSCULAR | Status: DC | PRN

## 2016-05-23 MED ORDER — PROPOFOL 500 MG/50ML IV EMUL
INTRAVENOUS | Status: DC | PRN
  Administered 2016-05-23: 25 ug/kg/min via INTRAVENOUS

## 2016-05-23 MED ORDER — MEPERIDINE HCL 25 MG/ML IJ SOLN: 6.2500 mg | INTRAMUSCULAR | Status: DC | PRN

## 2016-05-23 MED ORDER — HYDROMORPHONE HCL 1 MG/ML IJ SOLN: 0.2500 mg | INTRAMUSCULAR | Status: DC | PRN

## 2016-05-23 SURGICAL SUPPLY — 35 items
APPLICATOR COTTON TIP WD 3 STR (MISCELLANEOUS) ×3 IMPLANT
BLADE SURG 15 STRL LF DISP TIS (BLADE) ×1 IMPLANT
BLADE SURG 15 STRL SS (BLADE) ×2
CORD BIP STRL DISP 12FT (MISCELLANEOUS) ×3 IMPLANT
DRAPE HEAD BAR (DRAPES) ×3 IMPLANT
GAUZE SPONGE 4X4 12PLY STRL (GAUZE/BANDAGES/DRESSINGS) ×3 IMPLANT
GAUZE SPONGE NON-WVN 2X2 STRL (MISCELLANEOUS) ×10 IMPLANT
GLOVE SURG LX 7.0 MICRO (GLOVE) ×4
GLOVE SURG LX STRL 7.0 MICRO (GLOVE) ×2 IMPLANT
MARKER SKIN XFINE TIP W/RULER (MISCELLANEOUS) ×3 IMPLANT
NEEDLE FILTER BLUNT 18X 1/2SAF (NEEDLE) ×2
NEEDLE FILTER BLUNT 18X1 1/2 (NEEDLE) ×1 IMPLANT
NEEDLE HYPO 30X.5 LL (NEEDLE) ×6 IMPLANT
PACK DRAPE NASAL/ENT (PACKS) ×3 IMPLANT
SOL PREP PVP 2OZ (MISCELLANEOUS) ×3
SOLUTION PREP PVP 2OZ (MISCELLANEOUS) ×1 IMPLANT
SPONGE VERSALON 2X2 STRL (MISCELLANEOUS) ×20
SUT CHROMIC 4-0 (SUTURE)
SUT CHROMIC 4-0 M2 12X2 ARM (SUTURE)
SUT CHROMIC 5 0 P 3 (SUTURE) IMPLANT
SUT ETHILON 4 0 CL P 3 (SUTURE) IMPLANT
SUT MERSILENE 4-0 S-2 (SUTURE) IMPLANT
SUT PLAIN GUT (SUTURE) ×3 IMPLANT
SUT PROLENE 5 0 P 3 (SUTURE) IMPLANT
SUT PROLENE 6 0 P 1 18 (SUTURE) IMPLANT
SUT SILK 4 0 G 3 (SUTURE) IMPLANT
SUT VIC AB 5-0 P-3 18X BRD (SUTURE) IMPLANT
SUT VIC AB 5-0 P3 18 (SUTURE)
SUT VICRYL 6-0  S14 CTD (SUTURE)
SUT VICRYL 6-0 S14 CTD (SUTURE) IMPLANT
SUT VICRYL 7 0 TG140 8 (SUTURE) IMPLANT
SUTURE CHRMC 4-0 M2 12X2 ARM (SUTURE) IMPLANT
SYR 3ML LL SCALE MARK (SYRINGE) ×3 IMPLANT
SYRINGE 10CC LL (SYRINGE) ×3 IMPLANT
WATER STERILE IRR 500ML POUR (IV SOLUTION) ×3 IMPLANT

## 2016-05-23 NOTE — Transfer of Care (Signed)
Immediate Anesthesia Transfer of Care Note  Patient: Glenda Williams  Procedure(s) Performed: Procedure(s): BLEPHAROPLASTY upper eyelid; w/excess skin (Bilateral)  Patient Location: PACU  Anesthesia Type: MAC  Level of Consciousness: awake, alert  and patient cooperative  Airway and Oxygen Therapy: Patient Spontanous Breathing and Patient connected to supplemental oxygen  Post-op Assessment: Post-op Vital signs reviewed, Patient's Cardiovascular Status Stable, Respiratory Function Stable, Patent Airway and No signs of Nausea or vomiting  Post-op Vital Signs: Reviewed and stable  Complications: No apparent anesthesia complications

## 2016-05-23 NOTE — Interval H&P Note (Signed)
History and Physical Interval Note:  05/23/2016 11:55 AM  Glenda Williams  has presented today for surgery, with the diagnosis of H02.831 H02.834--dermatochalasis  The various methods of treatment have been discussed with the patient and family. After consideration of risks, benefits and other options for treatment, the patient has consented to  Procedure(s): BLEPHAROPLASTY upper eyelid; w/excess skin (Bilateral) as a surgical intervention .  The patient's history has been reviewed, patient examined, no change in status, stable for surgery.  I have reviewed the patient's chart and labs.  Questions were answered to the patient's satisfaction.     Vickki Muff, Rabecca Birge M

## 2016-05-23 NOTE — Anesthesia Postprocedure Evaluation (Signed)
Anesthesia Post Note  Patient: Glenda Williams  Procedure(s) Performed: Procedure(s) (LRB): BLEPHAROPLASTY upper eyelid; w/excess skin (Bilateral)  Patient location during evaluation: PACU Anesthesia Type: MAC Level of consciousness: awake and alert and oriented Pain management: pain level controlled Vital Signs Assessment: post-procedure vital signs reviewed and stable Respiratory status: spontaneous breathing and nonlabored ventilation Cardiovascular status: stable Postop Assessment: no signs of nausea or vomiting and adequate PO intake Anesthetic complications: no    Estill Batten

## 2016-05-23 NOTE — Anesthesia Preprocedure Evaluation (Signed)
Anesthesia Evaluation  Patient identified by MRN, date of birth, ID band  Reviewed: Allergy & Precautions, NPO status , Patient's Chart, lab work & pertinent test results  Airway Mallampati: II  TM Distance: >3 FB Neck ROM: Full    Dental no notable dental hx.    Pulmonary neg pulmonary ROS,    Pulmonary exam normal        Cardiovascular hypertension, Pt. on medications + dysrhythmias Atrial Fibrillation  Rhythm:Irregular Rate:Normal     Neuro/Psych Anxiety  Neuromuscular disease    GI/Hepatic GERD  Medicated and Controlled,  Endo/Other  Hypothyroidism   Renal/GU      Musculoskeletal  (+) Arthritis , Rheumatoid disorders,    Abdominal (+) + obese,   Peds  Hematology   Anesthesia Other Findings   Reproductive/Obstetrics                             Anesthesia Physical Anesthesia Plan  ASA: III  Anesthesia Plan: MAC   Post-op Pain Management:    Induction:   Airway Management Planned:   Additional Equipment:   Intra-op Plan:   Post-operative Plan:   Informed Consent: I have reviewed the patients History and Physical, chart, labs and discussed the procedure including the risks, benefits and alternatives for the proposed anesthesia with the patient or authorized representative who has indicated his/her understanding and acceptance.     Plan Discussed with: CRNA  Anesthesia Plan Comments:         Anesthesia Quick Evaluation

## 2016-05-23 NOTE — H&P (Signed)
See the history and physical completed at [facility] on 05/22/16 and scanned into the chart.

## 2016-05-23 NOTE — Op Note (Signed)
Preoperative Diagnosis:  Visually significant dermatochalasis bilateral Upper Eyelid(s)  Postoperative Diagnosis:  Same.  Procedure(s) Performed:   Upper eyelid blepharoplasty with excess skin excision  bilateral Upper Eyelid(s)  Teaching Surgeon: Philis Pique. Vickki Muff, M.D.  Assistants: none  Anesthesia: MAC  Specimens: None.  Estimated Blood Loss: Minimal.  Complications: None.  Operative Findings: None Dictated  Procedure:   Allergies were reviewed and the patient is allergic to Penicillins.   After the risks, benefits, complications and alternatives were discussed with the patient, appropriate informed consent was obtained and the patient was brought to the operating suite. The patient was reclined supine and a timeout was conducted.  The patient was then sedated.  Local anesthetic consisting of a 50-50 mixture of 2% lidocaine with epinephrine and 0.75% bupivacaine with added Hylenex was injected subcutaneously to both upper eyelid(s). After adequate local was instilled, the patient was prepped and draped in the usual sterile fashion for eyelid surgery.   Attention was turned to the upper eyelids. A 60m upper eyelid crease incision line was marked with calipers on both upper eyelid(s).  A pinch test was used to estimate the amount of excess skin to remove and this was marked in standard blepharoplasty style fashion. Attention was turned to the  right upper eyelid. A #15 blade was used to open the premarked incision line. A skin only flap was excised and hemostasis was obtained with bipolar cautery.   Attention was then turned to the opposite eyelid where the same procedure was performed in the same manner. Hemostasis was obtained with bipolar cautery throughout. All incisions were then closed with a combination of running and interrupted 6-0 fast absorbing plain suture. The patient tolerated the procedure well.  Erythromycin Ophthalmic ointment was applied to her incision sites,  followed by ice packs. She was taken to the recovery area where she recovered without difficulty.  Post-Op Plan/Instructions:  The patient was instructed to use ice packs frequently for the next 48 hours. She was instructed to use erythromycin ophthalmic ointment on her incisions 4 times a day for the next 12 to 14 days. She was given a prescription for Percocet for pain control should Tylenol not be effective. [facility] [place] [facility] [place] needed [facility] [place].  Thedford Bunton M. FVickki Muff M.D. Attending,Ophthalmology

## 2016-05-23 NOTE — Anesthesia Procedure Notes (Signed)
Procedure Name: MAC Performed by: Tacia Hindley Pre-anesthesia Checklist: Patient identified, Emergency Drugs available, Suction available, Patient being monitored and Timeout performed Patient Re-evaluated:Patient Re-evaluated prior to inductionOxygen Delivery Method: Nasal cannula       

## 2016-05-24 ENCOUNTER — Encounter: Payer: Self-pay | Admitting: Ophthalmology

## 2016-05-24 ENCOUNTER — Ambulatory Visit: Payer: Medicare Other | Admitting: Physician Assistant

## 2016-06-12 ENCOUNTER — Telehealth: Payer: Self-pay

## 2016-06-12 ENCOUNTER — Ambulatory Visit (INDEPENDENT_AMBULATORY_CARE_PROVIDER_SITE_OTHER): Payer: Medicare Other

## 2016-06-12 VITALS — BP 141/74 | HR 74 | Ht 65.0 in | Wt 259.7 lb

## 2016-06-12 DIAGNOSIS — R300 Dysuria: Secondary | ICD-10-CM

## 2016-06-12 LAB — MICROSCOPIC EXAMINATION

## 2016-06-12 LAB — URINALYSIS, COMPLETE
Bilirubin, UA: NEGATIVE
Glucose, UA: NEGATIVE
KETONES UA: NEGATIVE
NITRITE UA: NEGATIVE
Protein, UA: NEGATIVE
Urobilinogen, Ur: 0.2 mg/dL (ref 0.2–1.0)
pH, UA: 5.5 (ref 5.0–7.5)

## 2016-06-12 LAB — BLADDER SCAN AMB NON-IMAGING: Scan Result: 0

## 2016-06-12 NOTE — Progress Notes (Signed)
Pt presents today with c/o dysuria and severe back/flank pain. PVR-0. A clean catch was obtained for u/a and cx.   Blood pressure (!) 141/74, pulse 74, height 5\' 5"  (1.651 m), weight 259 lb 11.2 oz (117.8 kg).

## 2016-06-12 NOTE — Telephone Encounter (Signed)
Pt called stating she has eye surgery a couple of weeks ago and since surgery her legs have started swelling and her back is hurting in the kidney area. Pt then stated since surgery she also has increased her myrbetriq from 25mg  to 50mg . Pt was added to nurse schedule for bladder scan, u/a and cx.

## 2016-06-13 ENCOUNTER — Ambulatory Visit
Admission: RE | Admit: 2016-06-13 | Discharge: 2016-06-13 | Disposition: A | Discharge status: Home / Self Care | Payer: Medicare Other | Source: Ambulatory Visit | Attending: Physician Assistant | Admitting: Physician Assistant

## 2016-06-13 ENCOUNTER — Encounter: Payer: Self-pay | Admitting: Physician Assistant

## 2016-06-13 ENCOUNTER — Encounter: Payer: Self-pay | Admitting: *Deleted

## 2016-06-13 ENCOUNTER — Ambulatory Visit (INDEPENDENT_AMBULATORY_CARE_PROVIDER_SITE_OTHER): Payer: Medicare Other | Admitting: Physician Assistant

## 2016-06-13 ENCOUNTER — Telehealth: Payer: Self-pay

## 2016-06-13 VITALS — BP 134/72 | HR 84 | Temp 98.2°F | Resp 16 | Wt 250.0 lb

## 2016-06-13 DIAGNOSIS — E039 Hypothyroidism, unspecified: Secondary | ICD-10-CM

## 2016-06-13 DIAGNOSIS — I10 Essential (primary) hypertension: Secondary | ICD-10-CM

## 2016-06-13 DIAGNOSIS — I4891 Unspecified atrial fibrillation: Secondary | ICD-10-CM | POA: Diagnosis not present

## 2016-06-13 DIAGNOSIS — Z1211 Encounter for screening for malignant neoplasm of colon: Secondary | ICD-10-CM

## 2016-06-13 DIAGNOSIS — N390 Urinary tract infection, site not specified: Secondary | ICD-10-CM

## 2016-06-13 DIAGNOSIS — R600 Localized edema: Secondary | ICD-10-CM | POA: Insufficient documentation

## 2016-06-13 DIAGNOSIS — R739 Hyperglycemia, unspecified: Secondary | ICD-10-CM | POA: Diagnosis not present

## 2016-06-13 DIAGNOSIS — I517 Cardiomegaly: Secondary | ICD-10-CM | POA: Insufficient documentation

## 2016-06-13 DIAGNOSIS — E7800 Pure hypercholesterolemia, unspecified: Secondary | ICD-10-CM | POA: Diagnosis not present

## 2016-06-13 DIAGNOSIS — Z1231 Encounter for screening mammogram for malignant neoplasm of breast: Secondary | ICD-10-CM | POA: Diagnosis not present

## 2016-06-13 LAB — POCT GLYCOSYLATED HEMOGLOBIN (HGB A1C): Hemoglobin A1C: 6

## 2016-06-13 MED ORDER — SULFAMETHOXAZOLE-TRIMETHOPRIM 800-160 MG PO TABS: 1.0000 | ORAL_TABLET | Freq: Two times a day (BID) | ORAL | 0 refills | Status: AC

## 2016-06-13 NOTE — Telephone Encounter (Signed)
-----   Message from Hollice Espy, MD sent at 06/12/2016  5:16 PM EDT ----- Based on her urinalysis, I suspect infection. I'd like to go ahead and start her on Bactrim DS twice a day 7 days. We will adjust as needed based on her culture.  Hollice Espy, MD

## 2016-06-13 NOTE — Progress Notes (Signed)
Advised  ED 

## 2016-06-13 NOTE — Telephone Encounter (Signed)
Spoke with pt husband and made aware abx were sent to pharmacy. Husband voiced understanding.

## 2016-06-13 NOTE — Progress Notes (Signed)
Patient: Glenda Williams Female    DOB: [birth year]   76 y.o.   MRN: [id number] Visit Date: 06/13/2016  Today's Provider: Trinna Post, PA-C   Chief Complaint  Patient presents with  . Hypertension  . Hyperglycemia   Subjective:    HPI    Hyperglycemia, Follow-up:   Lab Results  Component Value Date   HGBA1C 5.8 11/23/2015   HGBA1C 5.9 07/26/2015   HGBA1C 6.0 04/26/2015   Last seen for hyperglycemia 6 months ago.  Management since then includes None. She reports excellent compliance with treatment. She is not having side effects.  Current symptoms include none and have been stable. Home blood sugar records: Usually under 100  Episodes of hypoglycemia? no   Most Recent Eye Exam: UTD Weight trend: stable Current diet: in general, a "healthy" diet   Current exercise: Tries to exercises regularly.  She will be starting PT for her RA soon.   ------------------------------------------------------------------------   Hypertension, follow-up:  BP Readings from Last 3 Encounters:  06/13/16 134/72  06/12/16 (!) 141/74  05/23/16 123/68    She was last seen for hypertension 6 months ago.  BP at that visit was 110/60. Management since that visit includes None.She reports excellent compliance with treatment. She is not having side effects.  She is exercising. She is adherent to low salt diet.   Outside blood pressures are 110/70's. She is experiencing lower extremity edema.  Patient denies chest pain, dyspnea, palpitations and syncope.   Use of agents associated with hypertension: none.   ------------------------------------------------------------------------   Wt Readings from Last 3 Encounters:  06/13/16 250 lb (113.4 kg)  06/12/16 259 lb 11.2 oz (117.8 kg)  05/23/16 241 lb (109.3 kg)   Screenings  Last mammogram in 07/2015 which was normal.  Last colonoscopy in 2006 with Dr. Fleet Contras with six <52mm "benign appearing" sessile polyps, none  since.  Bilateral Lower Extremity Edema  For the past week, patient has been having bilateral lower extremity edema. She associates this with the fact that she has been diagnosed with a recent UTI and has been sleeping in a recliner 2/2 back pain. She has been sleeping in the recliner and hasn't been elevating her legs in the past week. The edema improves with elevation. She sees Dr. Ubaldo Glassing for atrial fibrillation and I son Xarelto. She reports she had an echo in 2015, do not see in the chart. She is not having SOB, productive cough. She has gained 9 lbs since her 3/20/108 reading, she is 250 lbs today and up from 241 lbs today.   ------------------------------------------------------------------------       Allergies  Allergen Reactions  . Penicillins Rash     Current Outpatient Prescriptions:  .  cholecalciferol (VITAMIN D) 400 units TABS tablet, Take 400 Units by mouth daily., Disp: , Rfl:  .  erythromycin (ROMYCIN) ophthalmic ointment, Use a small amount on your sutures 4 times a day for the next 2 weeks. Switch to Aquaphor ointment should allergy develop., Disp: 3.5 g, Rfl: 3 .  folic acid (FOLVITE) 1 MG tablet, Take 1 mg by mouth daily., Disp: , Rfl:  .  hydrochlorothiazide (HYDRODIURIL) 12.5 MG tablet, TAKE 1 TABLET BY MOUTH  DAILY, Disp: 90 tablet, Rfl: 4 .  InFLIXimab (REMICADE IV), Inject into the vein. Every 6-8 weeks, Disp: , Rfl:  .  levothyroxine (SYNTHROID, LEVOTHROID) 75 MCG tablet, TAKE 1 TABLET BY MOUTH  DAILY, Disp: 90 tablet, Rfl: 4 .  methotrexate (RHEUMATREX) 2.5  MG tablet, 8 tabs by mouth weekly, Disp: , Rfl:  .  mirabegron ER (MYRBETRIQ) 25 MG TB24 tablet, Take 1 tablet (25 mg total) by mouth daily., Disp: 30 tablet, Rfl: 3 .  mirabegron ER (MYRBETRIQ) 25 MG TB24 tablet, Take by mouth., Disp: , Rfl:  .  Omega-3 Fatty Acids (FISH OIL) 1000 MG CAPS, Take 1 capsule by mouth daily., Disp: , Rfl:  .  omeprazole (PRILOSEC) 20 MG capsule, TAKE 1 CAPSULE BY MOUTH 2  TIMES  DAILY BEFORE A MEAL., Disp: 180 capsule, Rfl: 1 .  potassium chloride SA (K-DUR,KLOR-CON) 20 MEQ tablet, Take 1 tablet by mouth  daily, Disp: 90 tablet, Rfl: 1 .  propranolol (INDERAL) 40 MG tablet, Take by mouth 2 (two) times daily. 2 tablets in the am, 1 tablet in pm, Disp: , Rfl:  .  Red Yeast Rice 600 MG CAPS, Take 1 capsule by mouth daily., Disp: , Rfl:  .  rivaroxaban (XARELTO) 20 MG TABS tablet, Take 20 mg by mouth daily with supper., Disp: , Rfl:  .  rivaroxaban (XARELTO) 20 MG TABS tablet, TAKE 1 TABLET BY MOUTH ONCE DAILY, Disp: , Rfl:  .  sertraline (ZOLOFT) 50 MG tablet, TAKE 3 TABLETS BY MOUTH  DAILY, Disp: 270 tablet, Rfl: 1 .  sulfamethoxazole-trimethoprim (BACTRIM DS,SEPTRA DS) 800-160 MG tablet, Take 1 tablet by mouth 2 (two) times daily., Disp: 14 tablet, Rfl: 0  Review of Systems  Constitutional: Positive for chills and fatigue. Negative for activity change, appetite change, diaphoresis, fever and unexpected weight change.  Respiratory: Negative.   Cardiovascular: Positive for leg swelling. Negative for chest pain and palpitations.  Gastrointestinal: Negative.   Endocrine: Negative.   Musculoskeletal: Positive for back pain.  Neurological: Negative for dizziness, light-headedness and headaches.    Social History  Substance Use Topics  . Smoking status: Never Smoker  . Smokeless tobacco: Never Used  . Alcohol use No   Objective:   BP 134/72 (BP Location: Left Wrist, Patient Position: Sitting, Cuff Size: Normal)   Pulse 84   Temp 98.2 F (36.8 C) (Oral)   Resp 16   Wt 250 lb (113.4 kg)   BMI 41.60 kg/m  Vitals:   06/13/16 0923  BP: 134/72  Pulse: 84  Resp: 16  Temp: 98.2 F (36.8 C)  TempSrc: Oral  Weight: 250 lb (113.4 kg)     Physical Exam  Constitutional: She is oriented to person, place, and time. She appears well-developed and well-nourished.  Eyes: Conjunctivae are normal.  Neck: Neck supple. No thyromegaly present.  Cardiovascular: Normal  rate, regular rhythm, normal heart sounds and intact distal pulses.   Pulmonary/Chest: Breath sounds normal. No respiratory distress. She has no wheezes. She has no rales.  Abdominal: Soft. Bowel sounds are normal.  Musculoskeletal: She exhibits edema.  1+ pitting edema up to mid tibia bilaterally. Neurovascularly in tact.   Lymphadenopathy:    She has no cervical adenopathy.  Neurological: She is alert and oriented to person, place, and time.  Skin: Skin is warm and dry.  Psychiatric: She has a normal mood and affect. Her behavior is normal.        Assessment & Plan:     1. Essential hypertension  Blood pressure stable today. Labs as below.   - Comprehensive metabolic panel - CBC with Differential/Platelet  2. Atrial fibrillation, unspecified type (HCC)  On Xarelto 20 mg daily. Have given some free samples today as patient is in the donut hole.   3. Adult  hypothyroidism  Stable symptomatically on 75 mcg Synthroid. Will check as below.   - TSH  4. Elevated blood sugar  A1C today is 6.0, up from 5.8.   - POCT glycosylated hemoglobin (Hb A1C)  5. Hypercholesteremia  No side effects from statin. Will check as below.  - Lipid panel  6. Encounter for screening mammogram for breast cancer  Order placed, provided [place] contact card.   - MM Digital Screening; Future  7. Screening for colon cancer  No colonoscopy since 2006, will refer today.  - Ambulatory referral to Gastroenterology  8. Bilateral leg edema  Labs as below. Patient seeing cardiology in 07/2016.   - B Nat Peptide - DG Chest 2 View; Future  Return in about 6 months (around 12/13/2016) for HTN, Hypothyroid, chronic illness.  The entirety of the information documented in the History of Present Illness, Review of Systems and Physical Exam were personally obtained by me. Portions of this information were initially documented by Ashley Royalty, CMA and reviewed by me for thoroughness and accuracy.         Trinna Post, PA-C  [facility]

## 2016-06-13 NOTE — Patient Instructions (Addendum)
Please get labwork done at [facility] and then go across street at outpatient imaging center to get chest xray   Hypertension Hypertension is another name for high blood pressure. High blood pressure forces your heart to work harder to pump blood. This can cause problems over time. There are two numbers in a blood pressure reading. There is a top number (systolic) over a bottom number (diastolic). It is best to have a blood pressure below 120/80. Healthy choices can help lower your blood pressure. You may need medicine to help lower your blood pressure if:  Your blood pressure cannot be lowered with healthy choices.  Your blood pressure is higher than 130/80. Follow these instructions at home: Eating and drinking   If directed, follow the DASH eating plan. This diet includes:  Filling half of your plate at each meal with fruits and vegetables.  Filling one quarter of your plate at each meal with whole grains. Whole grains include whole wheat pasta, brown rice, and whole grain bread.  Eating or drinking low-fat dairy products, such as skim milk or low-fat yogurt.  Filling one quarter of your plate at each meal with low-fat (lean) proteins. Low-fat proteins include fish, skinless chicken, eggs, beans, and tofu.  Avoiding fatty meat, cured and processed meat, or chicken with skin.  Avoiding premade or processed food.  Eat less than 1,500 mg of salt (sodium) a day.  Limit alcohol use to no more than 1 drink a day for nonpregnant women and 2 drinks a day for men. One drink equals 12 oz of beer, 5 oz of wine, or 1 oz of hard liquor. Lifestyle   Work with your doctor to stay at a healthy weight or to lose weight. Ask your doctor what the best weight is for you.  Get at least 30 minutes of exercise that causes your heart to beat faster (aerobic exercise) most days of the week. This may include walking, swimming, or biking.  Get at least 30 minutes of exercise that strengthens your muscles  (resistance exercise) at least 3 days a week. This may include lifting weights or pilates.  Do not use any products that contain nicotine or tobacco. This includes cigarettes and e-cigarettes. If you need help quitting, ask your doctor.  Check your blood pressure at home as told by your doctor.  Keep all follow-up visits as told by your doctor. This is important. Medicines   Take over-the-counter and prescription medicines only as told by your doctor. Follow directions carefully.  Do not skip doses of blood pressure medicine. The medicine does not work as well if you skip doses. Skipping doses also puts you at risk for problems.  Ask your doctor about side effects or reactions to medicines that you should watch for. Contact a doctor if:  You think you are having a reaction to the medicine you are taking.  You have headaches that keep coming back (recurring).  You feel dizzy.  You have swelling in your ankles.  You have trouble with your vision. Get help right away if:  You get a very bad headache.  You start to feel confused.  You feel weak or numb.  You feel faint.  You get very bad pain in your:  Chest.  Belly (abdomen).  You throw up (vomit) more than once.  You have trouble breathing. Summary  Hypertension is another name for high blood pressure.  Making healthy choices can help lower blood pressure. If your blood pressure cannot be controlled with  healthy choices, you may need to take medicine. This information is not intended to replace advice given to you by your health care provider. Make sure you discuss any questions you have with your health care provider. Document Released: 08/09/2007 Document Revised: 01/19/2016 Document Reviewed: 01/19/2016 Elsevier Interactive Patient Education  2017 [employer].

## 2016-06-14 LAB — LIPID PANEL
Chol/HDL Ratio: 4.1 ratio (ref 0.0–4.4)
Cholesterol, Total: 168 mg/dL (ref 100–199)
HDL: 41 mg/dL (ref >39)
LDL Calculated: 110 mg/dL — ABNORMAL HIGH (ref 0–99)
Triglycerides: 84 mg/dL (ref 0–149)
VLDL Cholesterol Cal: 17 mg/dL (ref 5–40)

## 2016-06-14 LAB — COMPREHENSIVE METABOLIC PANEL
ALT: 17 IU/L (ref 0–32)
AST: 21 IU/L (ref 0–40)
Albumin/Globulin Ratio: 1 — ABNORMAL LOW (ref 1.2–2.2)
Albumin: 3.6 g/dL (ref 3.5–4.8)
Alkaline Phosphatase: 81 IU/L (ref 39–117)
BUN/Creatinine Ratio: 14 (ref 12–28)
BUN: 10 mg/dL (ref 8–27)
Bilirubin Total: 0.6 mg/dL (ref 0.0–1.2)
CO2: 30 mmol/L — ABNORMAL HIGH (ref 18–29)
Calcium: 9.1 mg/dL (ref 8.7–10.3)
Chloride: 100 mmol/L (ref 96–106)
Creatinine, Ser: 0.7 mg/dL (ref 0.57–1.00)
GFR calc Af Amer: 99 mL/min/{1.73_m2} (ref >59)
GFR calc non Af Amer: 86 mL/min/{1.73_m2} (ref >59)
Globulin, Total: 3.5 g/dL (ref 1.5–4.5)
Glucose: 97 mg/dL (ref 65–99)
Potassium: 3.4 mmol/L — ABNORMAL LOW (ref 3.5–5.2)
Sodium: 143 mmol/L (ref 134–144)
Total Protein: 7.1 g/dL (ref 6.0–8.5)

## 2016-06-14 LAB — CBC WITH DIFFERENTIAL/PLATELET
Basophils Absolute: 0.1 10*3/uL (ref 0.0–0.2)
Basos: 1 %
EOS (ABSOLUTE): 0.3 10*3/uL (ref 0.0–0.4)
Eos: 3 %
Hematocrit: 37.3 % (ref 34.0–46.6)
Hemoglobin: 12.4 g/dL (ref 11.1–15.9)
Immature Grans (Abs): 0.1 10*3/uL (ref 0.0–0.1)
Immature Granulocytes: 1 %
Lymphocytes Absolute: 2.7 10*3/uL (ref 0.7–3.1)
Lymphs: 26 %
MCH: 30.5 pg (ref 26.6–33.0)
MCHC: 33.2 g/dL (ref 31.5–35.7)
MCV: 92 fL (ref 79–97)
Monocytes Absolute: 0.9 10*3/uL (ref 0.1–0.9)
Monocytes: 9 %
Neutrophils Absolute: 6.2 10*3/uL (ref 1.4–7.0)
Neutrophils: 60 %
Platelets: 312 10*3/uL (ref 150–379)
RBC: 4.07 x10E6/uL (ref 3.77–5.28)
RDW: 15.6 % — ABNORMAL HIGH (ref 12.3–15.4)
WBC: 10.1 10*3/uL (ref 3.4–10.8)

## 2016-06-14 LAB — CULTURE, URINE COMPREHENSIVE

## 2016-06-14 LAB — TSH: TSH: 3.5 u[IU]/mL (ref 0.450–4.500)

## 2016-06-14 LAB — BRAIN NATRIURETIC PEPTIDE: BNP: 100.5 pg/mL — ABNORMAL HIGH (ref 0.0–100.0)

## 2016-06-15 ENCOUNTER — Telehealth: Payer: Self-pay

## 2016-06-15 ENCOUNTER — Other Ambulatory Visit: Payer: Self-pay | Admitting: Physician Assistant

## 2016-06-15 DIAGNOSIS — N390 Urinary tract infection, site not specified: Secondary | ICD-10-CM

## 2016-06-15 DIAGNOSIS — I10 Essential (primary) hypertension: Secondary | ICD-10-CM

## 2016-06-15 MED ORDER — NITROFURANTOIN MONOHYD MACRO 100 MG PO CAPS: 100.0000 mg | ORAL_CAPSULE | Freq: Two times a day (BID) | ORAL | 0 refills | Status: DC

## 2016-06-15 NOTE — Progress Notes (Signed)
Put in order for cmet for patient to get in one month. No appt necessary, just want labwork to recheck potassium and other electrolytes.

## 2016-06-15 NOTE — Telephone Encounter (Signed)
-----   Message from Hollice Espy, MD sent at 06/14/2016  5:20 PM EDT ----- Unfortunately, it looks like her infection is resistant to Bactrim. We need to change her antibiotics to nitrofurantoin 100 mg twice a day 10 days.  Hollice Espy, MD

## 2016-06-15 NOTE — Telephone Encounter (Signed)
Spoke with pt in reference to needing a different abx. Made aware abx were sent to pharmacy. Pt voiced understanding.

## 2016-06-16 NOTE — Progress Notes (Signed)
Advised  ED 

## 2016-06-19 ENCOUNTER — Other Ambulatory Visit: Payer: Self-pay | Admitting: General Surgery

## 2016-06-19 NOTE — Progress Notes (Signed)
Order(s) created erroneously. Erroneous order ID: [id number]  Order moved by: Kelly Splinter  Order move date/time: 06/19/2016 10:03 PM  Source Patient: [id number]  Source Contact: 06/19/2016  Destination Patient: [id number]  Destination Contact: 06/15/2016

## 2016-06-21 ENCOUNTER — Encounter: Payer: Self-pay | Admitting: General Surgery

## 2016-06-22 ENCOUNTER — Ambulatory Visit (INDEPENDENT_AMBULATORY_CARE_PROVIDER_SITE_OTHER): Payer: Medicare Other

## 2016-06-22 ENCOUNTER — Encounter: Payer: Self-pay | Admitting: General Surgery

## 2016-06-22 ENCOUNTER — Ambulatory Visit (INDEPENDENT_AMBULATORY_CARE_PROVIDER_SITE_OTHER): Payer: Medicare Other | Admitting: General Surgery

## 2016-06-22 VITALS — BP 153/79 | HR 101 | Ht 65.0 in | Wt 253.7 lb

## 2016-06-22 VITALS — BP 130/64 | HR 87 | Resp 14 | Ht 64.0 in | Wt 251.0 lb

## 2016-06-22 DIAGNOSIS — N390 Urinary tract infection, site not specified: Secondary | ICD-10-CM | POA: Diagnosis not present

## 2016-06-22 DIAGNOSIS — Z1211 Encounter for screening for malignant neoplasm of colon: Secondary | ICD-10-CM | POA: Diagnosis not present

## 2016-06-22 MED ORDER — POLYETHYLENE GLYCOL 3350 17 GM/SCOOP PO POWD: ORAL | 0 refills | Status: DC

## 2016-06-22 NOTE — Patient Instructions (Addendum)
Colonoscopy, Adult A colonoscopy is an exam to look at the entire large intestine. During the exam, a lubricated, bendable tube is inserted into the anus and then passed into the rectum, colon, and other parts of the large intestine. A colonoscopy is often done as a part of normal colorectal screening or in response to certain symptoms, such as anemia, persistent diarrhea, abdominal pain, and blood in the stool. The exam can help screen for and diagnose medical problems, including:  Tumors.  Polyps.  Inflammation.  Areas of bleeding. Tell a health care provider about:  Any allergies you have.  All medicines you are taking, including vitamins, herbs, eye drops, creams, and over-the-counter medicines.  Any problems you or family members have had with anesthetic medicines.  Any blood disorders you have.  Any surgeries you have had.  Any medical conditions you have.  Any problems you have had passing stool. What are the risks? Generally, this is a safe procedure. However, problems may occur, including:  Bleeding.  A tear in the intestine.  A reaction to medicines given during the exam.  Infection (rare). What happens before the procedure? Eating and drinking restrictions  Follow instructions from your health care provider about eating and drinking, which may include:  A few days before the procedure - follow a low-fiber diet. Avoid nuts, seeds, dried fruit, raw fruits, and vegetables.  1-3 days before the procedure - follow a clear liquid diet. Drink only clear liquids, such as clear broth or bouillon, black coffee or tea, clear juice, clear soft drinks or sports drinks, gelatin dessert, and popsicles. Avoid any liquids that contain red or purple dye.  On the day of the procedure - do not eat or drink anything during the 2 hours before the procedure, or within the time period that your health care provider recommends. Bowel prep  If you were prescribed an oral bowel prep to  clean out your colon:  Take it as told by your health care provider. Starting the day before your procedure, you will need to drink a large amount of medicated liquid. The liquid will cause you to have multiple loose stools until your stool is almost clear or light green.  If your skin or anus gets irritated from diarrhea, you may use these to relieve the irritation:  Medicated wipes, such as adult wet wipes with aloe and vitamin E.  A skin soothing-product like petroleum jelly.  If you vomit while drinking the bowel prep, take a break for up to 60 minutes and then begin the bowel prep again. If vomiting continues and you cannot take the bowel prep without vomiting, call your health care provider. General instructions   Ask your health care provider about changing or stopping your regular medicines. This is especially important if you are taking diabetes medicines or blood thinners.  Plan to have someone take you home from the hospital or clinic. What happens during the procedure?  An IV tube may be inserted into one of your veins.  You will be given medicine to help you relax (sedative).  To reduce your risk of infection:  Your health care team will wash or sanitize their hands.  Your anal area will be washed with soap.  You will be asked to lie on your side with your knees bent.  Your health care provider will lubricate a long, thin, flexible tube. The tube will have a camera and a light on the end.  The tube will be inserted into your anus.    The tube will be gently eased through your rectum and colon.  Air will be delivered into your colon to keep it open. You may feel some pressure or cramping.  The camera will be used to take images during the procedure.  A small tissue sample may be removed from your body to be examined under a microscope (biopsy). If any potential problems are found, the tissue will be sent to a lab for testing.  If small polyps are found, your health  care provider may remove them and have them checked for cancer cells.  The tube that was inserted into your anus will be slowly removed. The procedure may vary among health care providers and hospitals. What happens after the procedure?  Your blood pressure, heart rate, breathing rate, and blood oxygen level will be monitored until the medicines you were given have worn off.  Do not drive for 24 hours after the exam.  You may have a small amount of blood in your stool.  You may pass gas and have mild abdominal cramping or bloating due to the air that was used to inflate your colon during the exam.  It is up to you to get the results of your procedure. Ask your health care provider, or the department performing the procedure, when your results will be ready. This information is not intended to replace advice given to you by your health care provider. Make sure you discuss any questions you have with your health care provider. Document Released: 02/18/2000 Document Revised: 12/22/2015 Document Reviewed: 05/04/2015 Elsevier Interactive Patient Education  2017 [employer].    Patient to stop the  Xarelto two days before surgery and take a baby aspirin one week prior to surgery.

## 2016-06-22 NOTE — Progress Notes (Signed)
Pt walked into the clinic today with c/o developing hives and increased pulse rate since starting macrobid. Nurse noted hives all over pt body. Advised pt to stop taking macrobid. Pt stated pulse rate elevates after taking medication but if she lays down pulse goes back down. Reinforced with pt would seek further advise from Dr. Erlene Quan. Pt voiced understanding of whole conversation.   Blood pressure (!) 153/79, pulse (!) 101, height 5\' 5"  (1.651 m), weight 253 lb 11.2 oz (115.1 kg).

## 2016-06-22 NOTE — Progress Notes (Signed)
Patient ID: [name], female   DOB: Sep 23, 1940, 76 y.o.   MRN: [id number]  Chief Complaint  Patient presents with  . Colonoscopy    HPI JESSILYN CATINO is a 76 y.o. female here today for a evaluation of a screening colonoscopy. Last colonoscopy was done in 2006.Moves her bowels daily. Patient states she has a UITI. She see Dr.Brandon at [facility] for overactive bladder.In October 2017, she fell and has been walking with a cane since.  HPI  Past Medical History:  Diagnosis Date  . A-fib ([place])   . Arthritis   . GERD (gastroesophageal reflux disease)   . Heart disease   . Rheumatoid arthritis ([place])   . Thyroid disease   . Tremors of nervous system     Past Surgical History:  Procedure Laterality Date  . BREAST BIOPSY Left 1988   Benign  . BREAST BIOPSY Left 1987   Benign  . BROW LIFT Bilateral 05/23/2016   Procedure: BLEPHAROPLASTY upper eyelid; w/excess skin;  Surgeon: Karle Starch, MD;  Location: [facility];  Service: Ophthalmology;  Laterality: Bilateral;  . CARPAL TUNNEL RELEASE Bilateral   . CHOLECYSTECTOMY     Dr. Bary Castilla  . COLONOSCOPY  2006  . JOINT REPLACEMENT Bilateral    knees  . REPLACEMENT TOTAL KNEE Left 2005  . REPLACEMENT TOTAL KNEE Right 1998    Family History  Problem Relation Age of Onset  . Stroke Mother   . Hypertension Mother   . Heart disease Father   . Arthritis Father   . Parkinson's disease Sister   . Parkinson's disease Brother   . Breast cancer Neg Hx   . Bladder Cancer Neg Hx   . Kidney cancer Neg Hx     Social History Social History  Substance Use Topics  . Smoking status: Never Smoker  . Smokeless tobacco: Never Used  . Alcohol use No    Allergies  Allergen Reactions  . Macrobid [Nitrofurantoin Macrocrystal] Rash  . Penicillins Rash    Current Outpatient Prescriptions  Medication Sig Dispense Refill  . cholecalciferol (VITAMIN D) 400 units TABS tablet Take 400 Units by mouth daily.    . folic acid (FOLVITE) 1 MG  tablet Take 1 mg by mouth daily.    . hydrochlorothiazide (HYDRODIURIL) 12.5 MG tablet TAKE 1 TABLET BY MOUTH  DAILY 90 tablet 4  . InFLIXimab (REMICADE IV) Inject into the vein. Every 6-8 weeks    . levothyroxine (SYNTHROID, LEVOTHROID) 75 MCG tablet TAKE 1 TABLET BY MOUTH  DAILY 90 tablet 4  . methotrexate (RHEUMATREX) 2.5 MG tablet 8 tabs by mouth weekly    . mirabegron ER (MYRBETRIQ) 25 MG TB24 tablet Take 1 tablet (25 mg total) by mouth daily. 30 tablet 3  . Omega-3 Fatty Acids (FISH OIL) 1000 MG CAPS Take 1 capsule by mouth daily.    Marland Kitchen omeprazole (PRILOSEC) 20 MG capsule TAKE 1 CAPSULE BY MOUTH 2  TIMES DAILY BEFORE A MEAL. 180 capsule 1  . potassium chloride SA (K-DUR,KLOR-CON) 20 MEQ tablet Take 1 tablet by mouth  daily 90 tablet 1  . propranolol (INDERAL) 40 MG tablet Take by mouth 2 (two) times daily. 2 tablets in the am, 1 tablet in pm    . Red Yeast Rice 600 MG CAPS Take 1 capsule by mouth daily.    . rivaroxaban (XARELTO) 20 MG TABS tablet TAKE 1 TABLET BY MOUTH ONCE DAILY    . sertraline (ZOLOFT) 50 MG tablet TAKE 3 TABLETS BY  MOUTH  DAILY 270 tablet 1  . polyethylene glycol powder (GLYCOLAX/MIRALAX) powder 255 grams one bottle for colonoscopy prep 255 g 0   No current facility-administered medications for this visit.     Review of Systems Review of Systems  Constitutional: Negative.   Respiratory: Negative.   Cardiovascular: Negative.   Gastrointestinal: Negative.     Blood pressure 130/64, pulse 87, resp. rate 14, height 5\' 4"  (1.626 m), weight 251 lb (113.9 kg).  Physical Exam Physical Exam  Constitutional: She is oriented to person, place, and time. She appears well-developed and well-nourished.  Cardiovascular: An irregularly irregular rhythm present.  Pulmonary/Chest: Effort normal and breath sounds normal.  Neurological: She is alert and oriented to person, place, and time.  Skin: Skin is warm and dry.    Data Reviewed Laboratory studies 06/13/2016 were  reviewed. Hemoglobin 12.4 with an MCV of 92, white blood cell count 10,100, platelet count 312,000.  Electrolytes are notable for a minimal depressed potassium at 3.4. Creatinine 0.7. Estimated GFR 86.  Assessment    Candidate for screening colonoscopy.    Plan    The slight risk of stroke while off Xarelto in preparation for the procedure was reviewed. She has tolerated this without ill effect in the past for other surgery.    Colonoscopy with possible biopsy/polypectomy prn: Information regarding the procedure, including its potential risks and complications (including but not limited to perforation of the bowel, which may require emergency surgery to repair, and bleeding) was verbally given to the patient. Educational information regarding lower intestinal endoscopy was given to the patient. Written instructions for how to complete the bowel prep using Miralax were provided. The importance of drinking ample fluids to avoid dehydration as a result of the prep emphasized.  Patient to stop the  Xarelto two days before surgery and take a baby aspirin one week prior to surgery.  HPI, Physical Exam, Assessment and Plan have been scribed under the direction and in the presence of Hervey Ard, MD.  Gaspar Cola, CMA  I have completed the exam and reviewed the above documentation for accuracy and completeness.  I agree with the above.  Haematologist has been used and any errors in dictation or transcription are unintentional.  Hervey Ard, M.D., F.A.C.S.  Robert Bellow 06/23/2016, 3:02 PM   Patient has been scheduled for a colonoscopy on 08-09-16 at [facility]. This patient has been asked to start an 81 mg aspirin once daily one week prior to colonoscopy and stop Xarelto two days prior to procedure. Also, patient has been asked to discontinue fish oil one week prior to procedure. Miralax prescription has been sent in to the patient's pharmacy today. Colonoscopy instructions have been  reviewed with the patient. This patient is aware to call the office if they have further questions.   Dominga Ferry, CMA

## 2016-06-23 ENCOUNTER — Telehealth: Payer: Self-pay

## 2016-06-23 DIAGNOSIS — Z1211 Encounter for screening for malignant neoplasm of colon: Secondary | ICD-10-CM | POA: Insufficient documentation

## 2016-06-23 NOTE — Progress Notes (Signed)
She likely has completed days of antibiotics to clear her infection. If she becomes symptomatic again, with send her urine for culture.  Hollice Espy, MD

## 2016-06-23 NOTE — Telephone Encounter (Signed)
LMOM

## 2016-06-23 NOTE — Telephone Encounter (Signed)
Spoke with pt in reference to abx. Reinforced with pt if s/s return to call back and will re-cx urine. Pt voiced understanding.

## 2016-06-27 ENCOUNTER — Ambulatory Visit: Payer: Medicare Other | Attending: Rheumatology | Admitting: Physical Therapy

## 2016-06-27 ENCOUNTER — Encounter: Payer: Self-pay | Admitting: Physical Therapy

## 2016-06-27 DIAGNOSIS — R262 Difficulty in walking, not elsewhere classified: Secondary | ICD-10-CM | POA: Insufficient documentation

## 2016-06-27 DIAGNOSIS — M545 Low back pain: Secondary | ICD-10-CM | POA: Insufficient documentation

## 2016-06-27 DIAGNOSIS — M79605 Pain in left leg: Secondary | ICD-10-CM | POA: Diagnosis not present

## 2016-06-27 DIAGNOSIS — M6281 Muscle weakness (generalized): Secondary | ICD-10-CM | POA: Insufficient documentation

## 2016-06-27 DIAGNOSIS — G8929 Other chronic pain: Secondary | ICD-10-CM | POA: Insufficient documentation

## 2016-06-28 NOTE — Therapy (Signed)
[facility] [address], [place], [address] Phone: [phone]   Fax:  [phone]  Physical Therapy Evaluation  Patient Details  Name: Glenda Williams MRN: [id number] Date of Birth: [birth year] Referring Provider: Leeanne Mannan., Eugene Gavia MD  Encounter Date: 06/27/2016      PT End of Session - 06/27/16 1020    Visit Number 1   Number of Visits 12   Date for PT Re-Evaluation 08/08/16   Authorization Type 1   Authorization Time Period 10 (G code)   PT Start Time 0945   PT Stop Time 1035   PT Time Calculation (min) 50 min   Activity Tolerance Patient tolerated treatment well   Behavior During Therapy [facility] for tasks assessed/performed      Past Medical History:  Diagnosis Date  . A-fib ([place])   . Arthritis   . GERD (gastroesophageal reflux disease)   . Heart disease   . Rheumatoid arthritis ([place])   . Thyroid disease   . Tremors of nervous system     Past Surgical History:  Procedure Laterality Date  . BREAST BIOPSY Left 1988   Benign  . BREAST BIOPSY Left 1987   Benign  . BROW LIFT Bilateral 05/23/2016   Procedure: BLEPHAROPLASTY upper eyelid; w/excess skin;  Surgeon: Karle Starch, MD;  Location: [place];  Service: Ophthalmology;  Laterality: Bilateral;  . CARPAL TUNNEL RELEASE Bilateral   . CHOLECYSTECTOMY     Dr. Bary Castilla  . COLONOSCOPY  2006  . JOINT REPLACEMENT Bilateral    knees  . REPLACEMENT TOTAL KNEE Left 2005  . REPLACEMENT TOTAL KNEE Right 1998    There were no vitals filed for this visit.       Subjective Assessment - 06/27/16 1002    Subjective Patient reports she is currently having pain in her back with difficulty walking. Pain has been for the past 5-6 months. She was walking and moving much better before her fall and now has difficulty with rising from sit, walking, standing. She has pain in back and left leg.    Pertinent History Patient reports she has  had chronic back pain with arthritis and disc pain for years. She has had X rays. She reports she had a fall at home and tripped and fell forward into computer table with left sided bruing and pain left arm, ribs. she went to MD and had X rays of left arm, and knees with no fractures reported. She reports she had back pain which was due to UTI in March  2018 and she has been sitting in a chair to sleep. She has only been sleeping in her bed for the past 3 days. Last week She noticed itching in her back, she had hives/rash from macrobid. She is currently is improving with rash.    Limitations Walking;House hold activities;Other (comment)  rising from sitting   How long can you sit comfortably? <1 hour   How long can you stand comfortably? no more than 1 hour   How long can you walk comfortably? < 1 hour   Diagnostic tests X rays back, LE's   Patient Stated Goals to be able to walk without difficulty, feeling unbalanced   Currently in Pain? Yes   Pain Score 3    Pain Location Back   Pain Orientation Right;Left;Lower   Pain Descriptors / Indicators Aching;Sore  intermittenlty stabbing and burning   Pain Type Chronic pain   Pain Onset  More than a month ago   Pain Frequency Intermittent            [facility] PT Assessment - 06/27/16 0949      Assessment   Medical Diagnosis Chronic midline low back pain without sciatica (M54.5, G89.29) Leg pain, left (M79.605)   Referring Provider Leeanne Mannan., Eugene Gavia MD   Onset Date/Surgical Date 11/05/15   Hand Dominance Right   Next MD Visit unknown   Prior Therapy none     Precautions   Precautions None     Restrictions   Weight Bearing Restrictions No     Balance Screen   Has the patient fallen in the past 6 months No   Has the patient had a decrease in activity level because of a fear of falling?  No   Is the patient reluctant to leave their home because of a fear of falling?  No     Home Environment   Living Environment Private  residence   Living Arrangements Spouse/significant other   Type of [place] Access Other (comment)  one 4" step to enter   [place] - single point;Shower seat - built in;Walker - standard;Hand held shower head     Prior Function   Level of Independence Independent;Independent with community mobility with device;Independent with household mobility without device   Vocation Retired  from [employer]     Cognition   Overall Cognitive Status Within Functional Limits for tasks assessed   Attention Focused   Memory Appears intact      Objective: Gait: ambulating with SPC with decreased trunk rotation, slow cadence Strength;  Right LE hip flexion 4-/5, abduction 3-/5, extension 3+/5 Left LE hip flexion 4-/5, abduction 3-/5, extension 3+/5 Outcome measure:  MODI 52% (0 = no self perceived disability) 5x sit to stand: patient unable to perform even one rep indicating decreased LE strength  Instructed in sitting stabilization with ball rolling underfoot x 1 min. Each Diona Foley between knee hip adduction with glute sets 3 second holds x 10 reps Work on sit to stand from elevated surface and press through heels to stand, decrease leaning forward over knees        PT Education - 06/27/16 1016    Education provided Yes   Education Details POC; home program for basic stabilization   Person(s) Educated Patient   Methods Explanation;Demonstration;Verbal cues;Handout   Comprehension Verbalized understanding;Returned demonstration;Verbal cues required             PT Long Term Goals - 06/27/16 1200      PT LONG TERM GOAL #1   Title Patient will report decreased pain level to <2/10 with prolonged sitting >1 hour indicating improved function by 08/08/2016   Baseline sitting <1 hour with increased pain in back >5/10   Status New     PT LONG TERM GOAL #2   Title Patient will demonstrate improved self perceived  disability due to back symptoms with improved MODI of 25% or less by 08/08/2016   Baseline MODI 52%   Status New     PT LONG TERM GOAL #3   Title Patient will be independent with self managment of symptoms and posture awareness with home exercises without cuing in order return to prior level of function by 08/08/2016   Baseline limited/no knowledge of appropriate pain control strategies, posture awareness/exercises   Status New  Plan - 06/27/16 1030    Clinical Impression Statement Patient is a 76 year old female who presents with exacerbation of chronic lower back pain s/p fall around September 2017. She presents with decreased strength in core and both LE's, difficulty with rising from sit and decreased endurance for daily tasks. She has MODI score of 52% indicating severe self perceived disabilty. She has limited knowledge of appropriate pain control strategies and exercise, progression and will benefit from physical therapy intervention to improve function.     Rehab Potential Good   Clinical Impairments Affecting Rehab Potential (+) motivated(-) chronic condition, obesity, osteoarthritis, rheumatoid arthritis   PT Frequency 2x / week   PT Duration 6 weeks   PT Treatment/Interventions Electrical Stimulation;Moist Heat;Patient/family education;Neuromuscular re-education;Therapeutic exercise;Manual techniques   PT Next Visit Plan pain control, reduction of spasms, progressive exercise, manual techniques   PT Home Exercise Plan stabilization in sitting   Consulted and Agree with Plan of Care Patient      Patient will benefit from skilled therapeutic intervention in order to improve the following deficits and impairments:  Decreased strength, Pain, Impaired perceived functional ability, Decreased activity tolerance, Decreased endurance, Difficulty walking  Visit Diagnosis: Muscle weakness (generalized) - Plan: PT plan of care cert/re-cert  Chronic midline low back pain  without sciatica - Plan: PT plan of care cert/re-cert  Pain in left leg - Plan: PT plan of care cert/re-cert  Difficulty in walking, not elsewhere classified - Plan: PT plan of care cert/re-cert      G-Codes - 88/91/69 1200    Functional Assessment Tool Used (Outpatient Only) MODI, 5x sit to stand, pain, strength, clinical judgment   Functional Limitation Mobility: Walking and moving around   Mobility: Walking and Moving Around Current Status ([id number]) At least 40 percent but less than 60 percent impaired, limited or restricted   Mobility: Walking and Moving Around Goal Status [id number]) At least 20 percent but less than 40 percent impaired, limited or restricted       Problem List Patient Active Problem List   Diagnosis Date Noted  . Encounter for screening colonoscopy 06/23/2016  . Hypertension 04/26/2015  . Dry mouth 04/26/2015  . Bell palsy 09/04/2014  . Obesity 09/04/2014  . Allergic rhinitis 08/04/2014  . Anxiety 08/04/2014  . Cannot sleep 08/04/2014  . Neuropathic pain 08/04/2014  . Atrial fibrillation ([place]) 08/04/2014  . Avitaminosis D 08/04/2014  . Gastroesophageal reflux disease without esophagitis 11/07/2013  . Polypharmacy 08/19/2013  . Osteoarthritis 06/11/2013  . Rheumatoid arthritis ([place]) 06/11/2013  . Hypercholesteremia 05/12/2009  . Benign essential tremor 05/12/2009  . Elevated blood sugar 08/26/2008  . Adult hypothyroidism 08/26/2008  . Menopausal symptom 08/26/2008    Jomarie Longs PT 06/28/2016, 3:40 PM  [facility] [address] [place], [address] Phone: [phone]   Fax:  [phone]  Name: Glenda Williams MRN: [id number] Date of Birth: 24-Oct-1940

## 2016-06-29 ENCOUNTER — Ambulatory Visit: Payer: Medicare Other | Admitting: Physical Therapy

## 2016-07-03 ENCOUNTER — Encounter: Payer: Self-pay | Admitting: Physical Therapy

## 2016-07-03 ENCOUNTER — Ambulatory Visit: Payer: Medicare Other | Admitting: Physical Therapy

## 2016-07-03 DIAGNOSIS — M79605 Pain in left leg: Secondary | ICD-10-CM

## 2016-07-03 DIAGNOSIS — R262 Difficulty in walking, not elsewhere classified: Secondary | ICD-10-CM | POA: Diagnosis not present

## 2016-07-03 DIAGNOSIS — G8929 Other chronic pain: Secondary | ICD-10-CM

## 2016-07-03 DIAGNOSIS — M545 Low back pain: Secondary | ICD-10-CM

## 2016-07-03 DIAGNOSIS — M6281 Muscle weakness (generalized): Secondary | ICD-10-CM

## 2016-07-03 DIAGNOSIS — M0579 Rheumatoid arthritis with rheumatoid factor of multiple sites without organ or systems involvement: Secondary | ICD-10-CM | POA: Diagnosis not present

## 2016-07-03 NOTE — Therapy (Signed)
[facility] [address] [place], [address] Phone: [phone]   Fax:  [phone]  Physical Therapy Treatment  Patient Details  Name: Glenda Williams MRN: [id number] Date of Birth: 21-Oct-1940 Referring Provider: Leeanne Mannan., Eugene Gavia MD  Encounter Date: 07/03/2016      PT End of Session - 07/03/16 1011    Visit Number 2   Number of Visits 12   Date for PT Re-Evaluation 08/08/16   Authorization Type 2   Authorization Time Period 10 (G code)   PT Start Time 1006   PT Stop Time 1035   PT Time Calculation (min) 29 min   Activity Tolerance Patient tolerated treatment well   Behavior During Therapy [facility] for tasks assessed/performed      Past Medical History:  Diagnosis Date  . A-fib ([place])   . Arthritis   . GERD (gastroesophageal reflux disease)   . Heart disease   . Rheumatoid arthritis ([place])   . Thyroid disease   . Tremors of nervous system     Past Surgical History:  Procedure Laterality Date  . BREAST BIOPSY Left 1988   Benign  . BREAST BIOPSY Left 1987   Benign  . BROW LIFT Bilateral 05/23/2016   Procedure: BLEPHAROPLASTY upper eyelid; w/excess skin;  Surgeon: Karle Starch, MD;  Location: [place];  Service: Ophthalmology;  Laterality: Bilateral;  . CARPAL TUNNEL RELEASE Bilateral   . CHOLECYSTECTOMY     Dr. Bary Castilla  . COLONOSCOPY  2006  . JOINT REPLACEMENT Bilateral    knees  . REPLACEMENT TOTAL KNEE Left 2005  . REPLACEMENT TOTAL KNEE Right 1998    There were no vitals filed for this visit.      Subjective Assessment - 07/03/16 1008    Subjective Patient reports she is using [facility] and feels she is walking much better and feels more steady. She is exercising at home as instructed. She is having increased back pain with prolonged standing when cooking, cleaning.   Pertinent History Patient reports she has had chronic back pain with arthritis and disc pain for years. She  has had X rays. She reports she had a fall at home and tripped and fell forward into computer table with left sided bruing and pain left arm, ribs. she went to MD and had X rays of left arm, and knees with no fractures reported. She reports she had back pain which was due to UTI in March  2018 and she has been sitting in a chair to sleep. She has only been sleeping in her bed for the past 3 days. Last week She noticed itching in her back, she had hives/rash from macrobid. She is currently is improving with rash.    Limitations Walking;House hold activities;Other (comment)  rising from sitting   How long can you sit comfortably? <1 hour   How long can you stand comfortably? no more than 1 hour   How long can you walk comfortably? < 1 hour   Diagnostic tests X rays back, LE's   Patient Stated Goals to be able to walk without difficulty, feeling unbalanced   Currently in Pain? Yes   Pain Score 5   Pain Location Back   Pain Orientation Right;Left;Lower   Pain Descriptors / Indicators Aching;Spasm;Sore   Pain Type Chronic pain   Pain Onset More than a month ago   Pain Frequency Intermittent     Objective: Gait; with SPC with good cadence  and confidence with walking without LOB noted Palpation along bilateral paraspinal muscles thoracic to lumbar spine: increased tone noted on right with mild tenderness reported  Treatment:  Therapeutic exercise: patient performed exercises with verbal, tactile cues and demonstration of therapist: goals: strength, independent with home program Sitting: Weight shifting performed with demonstration and VC with arms out to side (90 degrees abduction) x 10, then arms in lap with weight shift to each hip x 15 reps with improved ROM and control noted with repetition Hip adduction with ball between and glute sets x 15 reps Hip abduction with resistive band with both LE's pressing out x 10, then single leg pressing out with opposite hip stabilizing x 5 each; more  difficulty pressing and stabilizing with left LE Ball (2#) roll under each with controlled motion at hip and knee x 25 reps each LE Knee flexion with green resistive band and assistance of therapist x 20 reps Hip flexion with green resistive band x 5 reps each  Patient response to treatment: patient demonstrated improved technique with exercises with minimal VC for correct alignment.  Improved motor control with repetition and cuing. Patient reported decreased back pain to 3/10 at end of session         PT Education - 07/03/16 1010    Education provided Yes   Education Details HEP: re assesssed and added exercises for sitting back stabilization and discussed possible use of neoprene back support for assisting with prolonged standing.    Person(s) Educated Patient   Methods Explanation;Demonstration;Verbal cues   Comprehension Verbalized understanding;Returned demonstration;Verbal cues required             PT Long Term Goals - 06/27/16 1200      PT LONG TERM GOAL #1   Title Patient will report decreased pain level to <2/10 with prolonged sitting >1 hour indicating improved function by 08/08/2016   Baseline sitting <1 hour with increased pain in back >5/10   Status New     PT LONG TERM GOAL #2   Title Patient will demonstrate improved self perceived disability due to back symptoms with improved MODI of 25% or less by 08/08/2016   Baseline MODI 52%   Status New     PT LONG TERM GOAL #3   Title Patient will be independent with self managment of symptoms and posture awareness with home exercises without cuing in order return to prior level of function by 08/08/2016   Baseline limited/no knowledge of appropriate pain control strategies, posture awareness/exercises   Status New               Plan - 07/03/16 1012    Clinical Impression Statement Patient arrived 20 min. Late for appointment which limited therapy session time. Patient reports she is going to get a back neoprene  support to assist with supporting during standing activities   Rehab Potential Good   Clinical Impairments Affecting Rehab Potential (+) motivated(-) chronic condition, obesity, osteoarthritis, rheumatoid arthritis   PT Frequency 2x / week   PT Duration 6 weeks   PT Treatment/Interventions Electrical Stimulation;Moist Heat;Patient/family education;Neuromuscular re-education;Therapeutic exercise;Manual techniques   PT Next Visit Plan pain control, reduction of spasms, progressive exercise, manual techniques   PT Home Exercise Plan stabilization in sitting      Patient will benefit from skilled therapeutic intervention in order to improve the following deficits and impairments:  Decreased strength, Pain, Impaired perceived functional ability, Decreased activity tolerance, Decreased endurance, Difficulty walking  Visit Diagnosis: Muscle weakness (generalized)  Chronic  midline low back pain without sciatica  Pain in left leg  Difficulty in walking, not elsewhere classified     Problem List Patient Active Problem List   Diagnosis Date Noted  . Encounter for screening colonoscopy 06/23/2016  . Hypertension 04/26/2015  . Dry mouth 04/26/2015  . Bell palsy 09/04/2014  . Obesity 09/04/2014  . Allergic rhinitis 08/04/2014  . Anxiety 08/04/2014  . Cannot sleep 08/04/2014  . Neuropathic pain 08/04/2014  . Atrial fibrillation ([place]) 08/04/2014  . Avitaminosis D 08/04/2014  . Gastroesophageal reflux disease without esophagitis 11/07/2013  . Polypharmacy 08/19/2013  . Osteoarthritis 06/11/2013  . Rheumatoid arthritis ([place]) 06/11/2013  . Hypercholesteremia 05/12/2009  . Benign essential tremor 05/12/2009  . Elevated blood sugar 08/26/2008  . Adult hypothyroidism 08/26/2008  . Menopausal symptom 08/26/2008    Jomarie Longs PT 07/03/2016, 11:57 AM  [facility] [address] [place], [address] Phone:  [phone]   Fax:  [phone]  Name: Glenda Williams MRN: [id number] Date of Birth: 05-Sep-1940

## 2016-07-05 DIAGNOSIS — I480 Paroxysmal atrial fibrillation: Secondary | ICD-10-CM | POA: Diagnosis not present

## 2016-07-05 DIAGNOSIS — I4891 Unspecified atrial fibrillation: Secondary | ICD-10-CM | POA: Diagnosis not present

## 2016-07-05 DIAGNOSIS — I10 Essential (primary) hypertension: Secondary | ICD-10-CM | POA: Diagnosis not present

## 2016-07-06 ENCOUNTER — Ambulatory Visit: Payer: Medicare Other | Attending: Rheumatology | Admitting: Physical Therapy

## 2016-07-06 DIAGNOSIS — M6281 Muscle weakness (generalized): Secondary | ICD-10-CM | POA: Diagnosis not present

## 2016-07-06 DIAGNOSIS — M545 Low back pain, unspecified: Secondary | ICD-10-CM

## 2016-07-06 DIAGNOSIS — R262 Difficulty in walking, not elsewhere classified: Secondary | ICD-10-CM

## 2016-07-06 DIAGNOSIS — G8929 Other chronic pain: Secondary | ICD-10-CM | POA: Insufficient documentation

## 2016-07-06 DIAGNOSIS — M79605 Pain in left leg: Secondary | ICD-10-CM

## 2016-07-06 NOTE — Therapy (Signed)
[facility] [address] [place], [address] Phone: [phone]   Fax:  [phone]  Physical Therapy Treatment  Patient Details  Name: Glenda Williams MRN: [id number] Date of Birth: 1941-01-21 Referring Provider: Leeanne Mannan., Eugene Gavia MD  Encounter Date: 07/06/2016      PT End of Session - 07/06/16 0957    Visit Number 3   Number of Visits 12   Date for PT Re-Evaluation 08/08/16   Authorization Type 3   Authorization Time Period 10 (G code)   PT Start Time [phone]   PT Stop Time 1030   PT Time Calculation (min) 43 min   Activity Tolerance Patient tolerated treatment well   Behavior During Therapy [facility] for tasks assessed/performed      Past Medical History:  Diagnosis Date  . A-fib ([place])   . Arthritis   . GERD (gastroesophageal reflux disease)   . Heart disease   . Rheumatoid arthritis ([place])   . Thyroid disease   . Tremors of nervous system     Past Surgical History:  Procedure Laterality Date  . BREAST BIOPSY Left 1988   Benign  . BREAST BIOPSY Left 1987   Benign  . BROW LIFT Bilateral 05/23/2016   Procedure: BLEPHAROPLASTY upper eyelid; w/excess skin;  Surgeon: Karle Starch, MD;  Location: [facility];  Service: Ophthalmology;  Laterality: Bilateral;  . CARPAL TUNNEL RELEASE Bilateral   . CHOLECYSTECTOMY     Dr. Bary Castilla  . COLONOSCOPY  2006  . JOINT REPLACEMENT Bilateral    knees  . REPLACEMENT TOTAL KNEE Left 2005  . REPLACEMENT TOTAL KNEE Right 1998    There were no vitals filed for this visit.      Subjective Assessment - 07/06/16 0950    Subjective Patient reports her legs are feeling better and her back is not doing well; her whole back is sore, spasms today. She was seen by cardiologist and is doing well.    Pertinent History Patient reports she has had chronic back pain with arthritis and disc pain for years. She has had X rays. She reports she had a fall at home  and tripped and fell forward into computer table with left sided bruing and pain left arm, ribs. she went to MD and had X rays of left arm, and knees with no fractures reported. She reports she had back pain which was due to UTI in March  2018 and she has been sitting in a chair to sleep. She has only been sleeping in her bed for the past 3 days. Last week She noticed itching in her back, she had hives/rash from macrobid. She is currently is improving with rash.    Limitations Walking;House hold activities;Other (comment)  rising from sitting   How long can you sit comfortably? <1 hour   How long can you stand comfortably? no more than 1 hour   How long can you walk comfortably? < 1 hour   Diagnostic tests X rays back, LE's   Patient Stated Goals to be able to walk without difficulty, feeling unbalanced   Currently in Pain? Yes   Pain Score 6    Pain Location Back   Pain Orientation Right;Left;Upper;Lower   Pain Descriptors / Indicators Aching;Spasm;Burning   Pain Type Chronic pain   Pain Onset More than a month ago   Pain Frequency Intermittent      Objective: Gait; with SPC with good cadence and confidence with  walking without LOB noted Palpation along bilateral paraspinal muscles thoracic to lumbar spine: increased tone noted bilaterally with mild tenderness reported  Treatment:  Manual therapy: goal: imporved soft tissue elasticity, decreased pain STM performed x 8 min. to thoracic to lumbar spine with patient seated on treatment table prior to exercise, superficial techniques  Therapeutic exercise: patient performed exercises with verbal, tactile cues and demonstration of therapist: goals: strength, independent with home program Sitting: Note: moist heat applied to back with patient seated in chair during exercises: no adverse reactions noted Weight shifting performed with demonstration and VC with arms out to side (90 degrees abduction) x 10, then arms in lap with weight shift to  each hip x 15 reps with improved ROM and control noted with repetition Hip adduction with ball and glute sets x 15 reps Hip abduction with resistive band with both LE's pressing out x 10, then single leg pressing out with opposite hip stabilizing x 5 each; more difficulty pressing and stabilizing with left LE Ball (2#) roll under each with controlled motion at hip and knee x 25 reps each LE Knee flexion with green resistive band and assistance of therapist x 20 reps Knee extension with 2# weights on ankles x 15 reps each 2 sets Hip flexion with green resistive band x 10 reps each  Patient response to treatment: Patient with improved technique with minimal VC and assistance for correct alignment. Improved pain to mild and improved soft tissue elasticity following STM and moist heat treatment. Pain level reported decreased to <3/10 at end of session.         PT Education - 07/06/16 0955    Education provided No   Education Details insruction for correct posture, alignment of trunk and LE's during exercises   Person(s) Educated Patient   Methods Explanation;Demonstration;Verbal cues   Comprehension Verbalized understanding;Returned demonstration;Verbal cues required             PT Long Term Goals - 06/27/16 1200      PT LONG TERM GOAL #1   Title Patient will report decreased pain level to <2/10 with prolonged sitting >1 hour indicating improved function by 08/08/2016   Baseline sitting <1 hour with increased pain in back >5/10   Status New     PT LONG TERM GOAL #2   Title Patient will demonstrate improved self perceived disability due to back symptoms with improved MODI of 25% or less by 08/08/2016   Baseline MODI 52%   Status New     PT LONG TERM GOAL #3   Title Patient will be independent with self managment of symptoms and posture awareness with home exercises without cuing in order return to prior level of function by 08/08/2016   Baseline limited/no knowledge of appropriate  pain control strategies, posture awareness/exercises   Status New               Plan - 07/06/16 0947    Clinical Impression Statement Patient is progressing steadily with goals for strength and pain control. She is responding well to current treatment with focus on core and LE strength with minimal to moderate cuing for correct alignment and technique. She should continue to improve with additional physical therapy intervention.    Rehab Potential Good   Clinical Impairments Affecting Rehab Potential (+) motivated(-) chronic condition, obesity, osteoarthritis, rheumatoid arthritis   PT Frequency 2x / week   PT Duration 6 weeks   PT Treatment/Interventions Electrical Stimulation;Moist Heat;Patient/family education;Neuromuscular re-education;Therapeutic exercise;Manual techniques   PT  Next Visit Plan pain control, reduction of spasms, progressive exercise, manual techniques   PT Home Exercise Plan stabilization in sitting      Patient will benefit from skilled therapeutic intervention in order to improve the following deficits and impairments:  Decreased strength, Pain, Impaired perceived functional ability, Decreased activity tolerance, Decreased endurance, Difficulty walking  Visit Diagnosis: Muscle weakness (generalized)  Chronic midline low back pain without sciatica  Pain in left leg  Difficulty in walking, not elsewhere classified     Problem List Patient Active Problem List   Diagnosis Date Noted  . Encounter for screening colonoscopy 06/23/2016  . Hypertension 04/26/2015  . Dry mouth 04/26/2015  . Bell palsy 09/04/2014  . Obesity 09/04/2014  . Allergic rhinitis 08/04/2014  . Anxiety 08/04/2014  . Cannot sleep 08/04/2014  . Neuropathic pain 08/04/2014  . Atrial fibrillation ([place]) 08/04/2014  . Avitaminosis D 08/04/2014  . Gastroesophageal reflux disease without esophagitis 11/07/2013  . Polypharmacy 08/19/2013  . Osteoarthritis 06/11/2013  . Rheumatoid  arthritis ([place]) 06/11/2013  . Hypercholesteremia 05/12/2009  . Benign essential tremor 05/12/2009  . Elevated blood sugar 08/26/2008  . Adult hypothyroidism 08/26/2008  . Menopausal symptom 08/26/2008    Jomarie Longs PT 07/07/2016, 12:11 PM  [facility] [address], [place], [address] Phone: [phone]   Fax:  [phone]  Name: MARRY KUSCH MRN: [id number] Date of Birth: [birth year]

## 2016-07-10 ENCOUNTER — Encounter: Payer: Self-pay | Admitting: Physical Therapy

## 2016-07-10 ENCOUNTER — Ambulatory Visit: Payer: Medicare Other | Admitting: Physical Therapy

## 2016-07-10 DIAGNOSIS — G8929 Other chronic pain: Secondary | ICD-10-CM

## 2016-07-10 DIAGNOSIS — M545 Low back pain, unspecified: Secondary | ICD-10-CM

## 2016-07-10 DIAGNOSIS — M79605 Pain in left leg: Secondary | ICD-10-CM

## 2016-07-10 DIAGNOSIS — M6281 Muscle weakness (generalized): Secondary | ICD-10-CM

## 2016-07-10 DIAGNOSIS — R262 Difficulty in walking, not elsewhere classified: Secondary | ICD-10-CM | POA: Diagnosis not present

## 2016-07-10 NOTE — Therapy (Signed)
[facility] [address] [place], [address] Phone: [phone]   Fax:  [phone]  Physical Therapy Treatment  Patient Details  Name: Glenda Williams MRN: [id number] Date of Birth: Feb 28, 1941 Referring Provider: Leeanne Mannan., Eugene Gavia MD  Encounter Date: 07/10/2016      PT End of Session - 07/10/16 1128    Visit Number 4   Number of Visits 12   Date for PT Re-Evaluation 08/08/16   Authorization Type 4   Authorization Time Period 10 (G code)   PT Start Time 1124   PT Stop Time 1200   PT Time Calculation (min) 36 min   Activity Tolerance Patient tolerated treatment well   Behavior During Therapy [facility] for tasks assessed/performed      Past Medical History:  Diagnosis Date  . A-fib ([place])   . Arthritis   . GERD (gastroesophageal reflux disease)   . Heart disease   . Rheumatoid arthritis ([place])   . Thyroid disease   . Tremors of nervous system     Past Surgical History:  Procedure Laterality Date  . BREAST BIOPSY Left 1988   Benign  . BREAST BIOPSY Left 1987   Benign  . BROW LIFT Bilateral 05/23/2016   Procedure: BLEPHAROPLASTY upper eyelid; w/excess skin;  Surgeon: Karle Starch, MD;  Location: [facility];  Service: Ophthalmology;  Laterality: Bilateral;  . CARPAL TUNNEL RELEASE Bilateral   . CHOLECYSTECTOMY     Dr. Bary Castilla  . COLONOSCOPY  2006  . JOINT REPLACEMENT Bilateral    knees  . REPLACEMENT TOTAL KNEE Left 2005  . REPLACEMENT TOTAL KNEE Right 1998    There were no vitals filed for this visit.      Subjective Assessment - 07/10/16 1125    Subjective Patient reports she was not too bad over the weekend and is feeling sore in left forearm today; she was doing flower arrangements over the weekend.    Pertinent History Patient reports she has had chronic back pain with arthritis and disc pain for years. She has had X rays. She reports she had a fall at home and tripped and  fell forward into computer table with left sided bruing and pain left arm, ribs. she went to MD and had X rays of left arm, and knees with no fractures reported. She reports she had back pain which was due to UTI in March  2018 and she has been sitting in a chair to sleep. She has only been sleeping in her bed for the past 3 days. Last week She noticed itching in her back, she had hives/rash from macrobid. She is currently is improving with rash.    Limitations Walking;House hold activities;Other (comment)  rising from sitting   How long can you sit comfortably? <1 hour   How long can you stand comfortably? no more than 1 hour   How long can you walk comfortably? < 1 hour   Diagnostic tests X rays back, LE's   Patient Stated Goals to be able to walk without difficulty, feeling unbalanced   Pain Score 3    Pain Location Back   Pain Orientation Right;Left;Lower;Upper   Pain Descriptors / Indicators Aching;Tightness   Pain Type Chronic pain   Pain Onset More than a month ago   Pain Frequency Intermittent      Objective: Gait; with SPC with good cadence and confidence with walking without LOB noted; without cane for support; increased limp  on left with patient reporting left knee weakness as contributing factor Palpation along bilateral paraspinal muscles thoracic to lumbar spine: increased tone noted bilaterally with mild tenderness reported  Treatment:  Therapeutic exercise: patient performed exercises with verbal, tactile cues and demonstration of therapist: goals: strength, independent with home program Sitting: Note: moist heat applied to back with patient seated in chair during exercises: no adverse reactions noted Weight shifting performed with demonstration and VC with arms out to side (90 degrees abduction) x 10, then arms in lap with weight shift to each hip x 15 reps with improved ROM and control noted with repetition Hip adduction with ball and glute sets x 15 reps Hip abduction  with resistive band with both LE's pressing out x 10, then single leg pressing out with opposite hip stabilizing x 10 each; more difficulty pressing and stabilizing with left LE Ball roll under each with controlled motion at hip and knee x 25 reps each LE Knee flexion with greenresistive band and assistance of therapist x 20reps Knee extension with 3# weights on ankles x 15 reps each 2 sets Hip flexion with green resistive band x 10 reps right LE, 5 reps left LE due to fatigue, weakness Sitting on treatment table; Ball toss x 25 reps for core stabilization/control Isometric stabilization of trunk for flexion/extension x 10 reps with moderate manual resistance given palloff press with green resistive band x 10 reps Raise ball to forehead x 10 reps with inhale on up, exhale lowering ball  Patient response to treatment: Patient demonstrated improved technique with exercises with minimal VC for correct alignment. Improved motor control with repetition and cuing. Patient verbalized good understanding of home program.        PT Education - 07/10/16 1127    Education provided Yes   Education Details re assessed home exercises.    Person(s) Educated Patient   Methods Explanation;Demonstration;Verbal cues;Handout   Comprehension Verbalized understanding;Returned demonstration;Verbal cues required             PT Long Term Goals - 06/27/16 1200      PT LONG TERM GOAL #1   Title Patient will report decreased pain level to <2/10 with prolonged sitting >1 hour indicating improved function by 08/08/2016   Baseline sitting <1 hour with increased pain in back >5/10   Status New     PT LONG TERM GOAL #2   Title Patient will demonstrate improved self perceived disability due to back symptoms with improved MODI of 25% or less by 08/08/2016   Baseline MODI 52%   Status New     PT LONG TERM GOAL #3   Title Patient will be independent with self managment of symptoms and posture awareness with home  exercises without cuing in order return to prior level of function by 08/08/2016   Baseline limited/no knowledge of appropriate pain control strategies, posture awareness/exercises   Status New               Plan - 07/10/16 1128    Clinical Impression Statement Patient is progressing steadily with goals and is progressing well with home exercises with instruction and handouts. She continues with difficulty walking with limp on left LE if not walking with cane. She will continue to benefit from physical therapy for strengthening of core and LE's in order to walk with less difficulty.    Rehab Potential Good   Clinical Impairments Affecting Rehab Potential (+) motivated(-) chronic condition, obesity, osteoarthritis, rheumatoid arthritis   PT Frequency 2x /  week   PT Duration 6 weeks   PT Treatment/Interventions Electrical Stimulation;Moist Heat;Patient/family education;Neuromuscular re-education;Therapeutic exercise;Manual techniques   PT Next Visit Plan pain control, reduction of spasms, progressive exercise, manual techniques   PT Home Exercise Plan stabilization in sitting      Patient will benefit from skilled therapeutic intervention in order to improve the following deficits and impairments:  Decreased strength, Pain, Impaired perceived functional ability, Decreased activity tolerance, Decreased endurance, Difficulty walking  Visit Diagnosis: Muscle weakness (generalized)  Chronic midline low back pain without sciatica  Pain in left leg  Difficulty in walking, not elsewhere classified     Problem List Patient Active Problem List   Diagnosis Date Noted  . Encounter for screening colonoscopy 06/23/2016  . Hypertension 04/26/2015  . Dry mouth 04/26/2015  . Bell palsy 09/04/2014  . Obesity 09/04/2014  . Allergic rhinitis 08/04/2014  . Anxiety 08/04/2014  . Cannot sleep 08/04/2014  . Neuropathic pain 08/04/2014  . Atrial fibrillation ([place]) 08/04/2014  . Avitaminosis D  08/04/2014  . Gastroesophageal reflux disease without esophagitis 11/07/2013  . Polypharmacy 08/19/2013  . Osteoarthritis 06/11/2013  . Rheumatoid arthritis ([place]) 06/11/2013  . Hypercholesteremia 05/12/2009  . Benign essential tremor 05/12/2009  . Elevated blood sugar 08/26/2008  . Adult hypothyroidism 08/26/2008  . Menopausal symptom 08/26/2008    Jomarie Longs PT 07/10/2016, 1:34 PM  [facility] [address], [place], [address] Phone: [phone]   Fax:  [phone]  Name: SHAUNTELL IGLESIA MRN: [id number] Date of Birth: 1940/08/04

## 2016-07-14 ENCOUNTER — Encounter: Payer: Medicare Other | Admitting: Physical Therapy

## 2016-07-19 ENCOUNTER — Ambulatory Visit: Payer: Medicare Other | Admitting: Physical Therapy

## 2016-07-19 ENCOUNTER — Encounter: Payer: Self-pay | Admitting: Physical Therapy

## 2016-07-19 DIAGNOSIS — R262 Difficulty in walking, not elsewhere classified: Secondary | ICD-10-CM | POA: Diagnosis not present

## 2016-07-19 DIAGNOSIS — M545 Low back pain: Secondary | ICD-10-CM

## 2016-07-19 DIAGNOSIS — G8929 Other chronic pain: Secondary | ICD-10-CM | POA: Diagnosis not present

## 2016-07-19 DIAGNOSIS — M79605 Pain in left leg: Secondary | ICD-10-CM

## 2016-07-19 DIAGNOSIS — M6281 Muscle weakness (generalized): Secondary | ICD-10-CM

## 2016-07-19 NOTE — Therapy (Signed)
[facility] [place] [place] [facility] [address] [place], [address] Phone: [phone]   Fax:  [phone]  Physical Therapy Treatment  Patient Details  Name: Glenda Williams MRN: [id number] Date of Birth: [birth year] Referring Provider: Leeanne Mannan., Eugene Gavia MD  Encounter Date: 07/19/2016      PT End of Session - 07/19/16 0951    Visit Number 5   Number of Visits 12   Date for PT Re-Evaluation 08/08/16   Authorization Type 5   Authorization Time Period 10 (G code)   PT Start Time [phone]   PT Stop Time 1030   PT Time Calculation (min) 49 min   Activity Tolerance Patient tolerated treatment well   Behavior During Therapy [facility] for tasks assessed/performed      Past Medical History:  Diagnosis Date  . A-fib ([place])   . Arthritis   . GERD (gastroesophageal reflux disease)   . Heart disease   . Rheumatoid arthritis ([place])   . Thyroid disease   . Tremors of nervous system     Past Surgical History:  Procedure Laterality Date  . BREAST BIOPSY Left 1988   Benign  . BREAST BIOPSY Left 1987   Benign  . BROW LIFT Bilateral 05/23/2016   Procedure: BLEPHAROPLASTY upper eyelid; w/excess skin;  Surgeon: Karle Starch, MD;  Location: [place];  Service: Ophthalmology;  Laterality: Bilateral;  . CARPAL TUNNEL RELEASE Bilateral   . CHOLECYSTECTOMY     Dr. Bary Castilla  . COLONOSCOPY  2006  . JOINT REPLACEMENT Bilateral    knees  . REPLACEMENT TOTAL KNEE Left 2005  . REPLACEMENT TOTAL KNEE Right 1998    There were no vitals filed for this visit.      Subjective Assessment - 07/19/16 0944    Subjective Patient reports her back pain is better today and she is "working through the back pain" with exercises and rest. Her uper back was bothering her for the past few days.    Pertinent History Patient reports she has had chronic back pain with arthritis and disc pain for years. She has had X rays. She reports she had a fall at  home and tripped and fell forward into computer table with left sided bruing and pain left arm, ribs. she went to MD and had X rays of left arm, and knees with no fractures reported. She reports she had back pain which was due to UTI in March  2018 and she has been sitting in a chair to sleep. She has only been sleeping in her bed for the past 3 days. Last week She noticed itching in her back, she had hives/rash from macrobid. She is currently is improving with rash.    Limitations Walking;House hold activities;Other (comment)  rising from sitting   How long can you sit comfortably? <1 hour   How long can you stand comfortably? no more than 1 hour   How long can you walk comfortably? < 1 hour   Diagnostic tests X rays back, LE's   Patient Stated Goals to be able to walk without difficulty, feeling unbalanced   Currently in Pain? No/denies   Pain Onset More than a month ago      Objective: Gait; with SPC with good cadence and confidence with walking without LOB noted; without cane for support; increased limp on left with + right hip drop, decreased control on left to maintain level pelvis Strength: decreased left hip abduction, decreased ability to  level pelvis with single leg stand on left LE  Treatment:  Therapeutic exercise: patient performed exercises with verbal, tactile cues and demonstration of therapist: goals: strength, independent with home program Sitting on treatment table:  Weight shifting performed with demonstration and VC arms in lap with weight shift to each hip x 15 reps with improved ROM and control noted with repetition Hip adduction with ball and glute sets x 15 reps Hip abduction with resistive band with both LE's pressing out x 10, then single leg pressing out with opposite hip stabilizing x 10 each; more difficulty pressing and stabilizing with left LE Knee flexion with greenresistive band and assistance of therapist 2 x 20reps Knee extension with 3# weights on  ankles x 15 reps each, 2 sets Hip flexion with green resistive band x 10reps right LE, 5 reps left LE due to fatigue, weakness Ball toss x 30 reps for core stabilization/control Isometric stabilization of trunk for flexion/extension x 10 reps with moderate manual resistance given palloff press with green resistive band x 10 reps Scapular rows single and bilateral UE's x 15 reps each Ball toss at wall x 20 reps with patient seated in chair Standing side stepping along counter x 2 sets, standing on left LE with right LE on 2" step weight shifting to engage left hip abductors, keeping pelvis level, cuing to contract gluteals during exercise  Patient response to treatment: Patient able to perform all exercises with assistance, minimal verbal cuing for correct technique and positioning. No increased pain reported during exercises.            PT Education - 07/19/16 0946    Education provided Yes   Education Details instruction with exercises, position, technique. soft tissue healing.    Person(s) Educated Patient   Methods Explanation   Comprehension Verbalized understanding             PT Long Term Goals - 06/27/16 1200      PT LONG TERM GOAL #1   Title Patient will report decreased pain level to <2/10 with prolonged sitting >1 hour indicating improved function by 08/08/2016   Baseline sitting <1 hour with increased pain in back >5/10   Status New     PT LONG TERM GOAL #2   Title Patient will demonstrate improved self perceived disability due to back symptoms with improved MODI of 25% or less by 08/08/2016   Baseline MODI 52%   Status New     PT LONG TERM GOAL #3   Title Patient will be independent with self managment of symptoms and posture awareness with home exercises without cuing in order return to prior level of function by 08/08/2016   Baseline limited/no knowledge of appropriate pain control strategies, posture awareness/exercises   Status New                Plan - 07/19/16 0951    Clinical Impression Statement Patient is progressing well with exercises. she conitnues with decreased strength and requires assistance to complete exercises with correct technique and intensity. She will require additional physical therapy intervention to achieve goals.    Rehab Potential Good   Clinical Impairments Affecting Rehab Potential (+) motivated(-) chronic condition, obesity, osteoarthritis, rheumatoid arthritis   PT Frequency 2x / week   PT Duration 6 weeks   PT Treatment/Interventions Electrical Stimulation;Moist Heat;Patient/family education;Neuromuscular re-education;Therapeutic exercise;Manual techniques   PT Next Visit Plan pain control, reduction of spasms, progressive exercise, manual techniques   PT Home Exercise Plan stabilization in sitting  Patient will benefit from skilled therapeutic intervention in order to improve the following deficits and impairments:  Decreased strength, Pain, Impaired perceived functional ability, Decreased activity tolerance, Decreased endurance, Difficulty walking  Visit Diagnosis: Muscle weakness (generalized)  Chronic midline low back pain without sciatica  Pain in left leg  Difficulty in walking, not elsewhere classified     Problem List Patient Active Problem List   Diagnosis Date Noted  . Encounter for screening colonoscopy 06/23/2016  . Hypertension 04/26/2015  . Dry mouth 04/26/2015  . Bell palsy 09/04/2014  . Obesity 09/04/2014  . Allergic rhinitis 08/04/2014  . Anxiety 08/04/2014  . Cannot sleep 08/04/2014  . Neuropathic pain 08/04/2014  . Atrial fibrillation ([place]) 08/04/2014  . Avitaminosis D 08/04/2014  . Gastroesophageal reflux disease without esophagitis 11/07/2013  . Polypharmacy 08/19/2013  . Osteoarthritis 06/11/2013  . Rheumatoid arthritis ([place]) 06/11/2013  . Hypercholesteremia 05/12/2009  . Benign essential tremor 05/12/2009  . Elevated blood sugar 08/26/2008  . Adult  hypothyroidism 08/26/2008  . Menopausal symptom 08/26/2008    Jomarie Longs PT 07/19/2016, 10:39 AM  [place] [address] [place], [address] Phone: [phone]   Fax:  [phone]  Name: MIRRANDA MONRROY MRN: [id number] Date of Birth: [birth year]

## 2016-07-21 ENCOUNTER — Ambulatory Visit: Payer: Medicare Other | Admitting: Physical Therapy

## 2016-07-21 ENCOUNTER — Encounter: Payer: Self-pay | Admitting: Physical Therapy

## 2016-07-21 DIAGNOSIS — M6281 Muscle weakness (generalized): Secondary | ICD-10-CM | POA: Diagnosis not present

## 2016-07-21 DIAGNOSIS — R262 Difficulty in walking, not elsewhere classified: Secondary | ICD-10-CM | POA: Diagnosis not present

## 2016-07-21 DIAGNOSIS — M79605 Pain in left leg: Secondary | ICD-10-CM | POA: Diagnosis not present

## 2016-07-21 DIAGNOSIS — M545 Low back pain, unspecified: Secondary | ICD-10-CM

## 2016-07-21 DIAGNOSIS — G8929 Other chronic pain: Secondary | ICD-10-CM | POA: Diagnosis not present

## 2016-07-22 NOTE — Therapy (Signed)
[place] [address] [place], [address] Phone: [phone]   Fax:  [phone]  Physical Therapy Treatment  Patient Details  Name: Glenda Williams MRN: [id number] Date of Birth: [birth year] Referring Provider: Leeanne Mannan., Eugene Gavia MD  Encounter Date: 07/21/2016      PT End of Session - 07/21/16 1000    Visit Number 6   Number of Visits 12   Date for PT Re-Evaluation 08/08/16   Authorization Type 6   Authorization Time Period 10 (G code)   PT Start Time 0920   PT Stop Time 1000   PT Time Calculation (min) 40 min   Activity Tolerance Patient tolerated treatment well   Behavior During Therapy [facility] for tasks assessed/performed      Past Medical History:  Diagnosis Date  . A-fib ([place])   . Arthritis   . GERD (gastroesophageal reflux disease)   . Heart disease   . Rheumatoid arthritis ([place])   . Thyroid disease   . Tremors of nervous system     Past Surgical History:  Procedure Laterality Date  . BREAST BIOPSY Left 1988   Benign  . BREAST BIOPSY Left 1987   Benign  . BROW LIFT Bilateral 05/23/2016   Procedure: BLEPHAROPLASTY upper eyelid; w/excess skin;  Surgeon: Karle Starch, MD;  Location: [place];  Service: Ophthalmology;  Laterality: Bilateral;  . CARPAL TUNNEL RELEASE Bilateral   . CHOLECYSTECTOMY     Dr. Bary Castilla  . COLONOSCOPY  2006  . JOINT REPLACEMENT Bilateral    knees  . REPLACEMENT TOTAL KNEE Left 2005  . REPLACEMENT TOTAL KNEE Right 1998    There were no vitals filed for this visit.      Subjective Assessment - 07/21/16 0939    Subjective Patient reports she is having less swelling in her LE's and is now drinking more water with lemon and feels this is helping. She is walking better with less pain in left LE as well.    Pertinent History Patient reports she has had chronic back pain with arthritis and disc pain for years. She has had X rays. She reports she  had a fall at home and tripped and fell forward into computer table with left sided bruing and pain left arm, ribs. she went to MD and had X rays of left arm, and knees with no fractures reported. She reports she had back pain which was due to UTI in March  2018 and she has been sitting in a chair to sleep. She has only been sleeping in her bed for the past 3 days. Last week She noticed itching in her back, she had hives/rash from macrobid. She is currently is improving with rash.    Limitations Walking;House hold activities;Other (comment)  rising from sitting   How long can you sit comfortably? <1 hour   How long can you stand comfortably? no more than 1 hour   How long can you walk comfortably? < 1 hour   Diagnostic tests X rays back, LE's   Patient Stated Goals to be able to walk without difficulty, feeling unbalanced   Pain Onset More than a month ago      Objective: Gait; with SPC with good cadence and confidence with walking without LOB noted; without cane for support; mild right hip drop, improved gait pattern from previous session  Treatment:  Therapeutic exercise: patient performed exercises with verbal, tactile cues and demonstration of therapist: goals:  strength, independent with home program Sitting on treatment table:  Weight shifting performed with demonstration and VC arms in lap with weight shift to each hip x 20 reps with improved ROM and control noted with repetition Hip adduction with ball and glute sets x 15 reps Hip abduction with resistive band with single leg pressing out with opposite hip stabilizing x 10each Knee flexion with greenresistive band and assistance of therapist 2 x 20reps Hip flexion with green resistive band x 10reps right LE, 2 x 5 reps left LE due to fatigue, weakness Ball toss x 30 reps for core stabilization/control Isometric stabilization of trunk for flexion/extension x 10 reps with moderate manual resistance given Standing: Step ups onto  small balance stones leading with each LE x 10 reps with UE support on counter and contact guard of therapist for safety Staggered stance on balance stones with weight shift forward and back and then switch stance x 1 min. Each with UE support single and bilateral as needed Sit to stand 3 x 5 reps off elevated treatment table with VC for correct technique and pressing weight through heels; therapist UE support for facilitation of correct technique Patient response to treatment: Patient demonstrated improved technique with exercises with minimal VC and demonstration for correct alignment. Improved motor control with repetition and cuing.       PT Education - 07/21/16 1000    Education provided Yes   Education Details re assessed exercises, instruction for correct tehcnique, sequence    Person(s) Educated Patient   Methods Explanation;Demonstration;Verbal cues   Comprehension Verbalized understanding;Returned demonstration;Verbal cues required             PT Long Term Goals - 06/27/16 1200      PT LONG TERM GOAL #1   Title Patient will report decreased pain level to <2/10 with prolonged sitting >1 hour indicating improved function by 08/08/2016   Baseline sitting <1 hour with increased pain in back >5/10   Status New     PT LONG TERM GOAL #2   Title Patient will demonstrate improved self perceived disability due to back symptoms with improved MODI of 25% or less by 08/08/2016   Baseline MODI 52%   Status New     PT LONG TERM GOAL #3   Title Patient will be independent with self managment of symptoms and posture awareness with home exercises without cuing in order return to prior level of function by 08/08/2016   Baseline limited/no knowledge of appropriate pain control strategies, posture awareness/exercises   Status New               Plan - 07/21/16 1000    Clinical Impression Statement Patient is progressing with increased endurance and strength as noted with increased  intensity of exercises with mild fatigue today. she required minimal VC and assistance to complete exercises and should continue to improve with additional physical therapy intervention.    Rehab Potential Good   Clinical Impairments Affecting Rehab Potential (+) motivated(-) chronic condition, obesity, osteoarthritis, rheumatoid arthritis   PT Frequency 2x / week   PT Duration 6 weeks   PT Treatment/Interventions Electrical Stimulation;Moist Heat;Patient/family education;Neuromuscular re-education;Therapeutic exercise;Manual techniques   PT Next Visit Plan pain control, reduction of spasms, progressive exercise, manual techniques   PT Home Exercise Plan stabilization in sitting      Patient will benefit from skilled therapeutic intervention in order to improve the following deficits and impairments:  Decreased strength, Pain, Impaired perceived functional ability, Decreased activity tolerance, Decreased  endurance, Difficulty walking  Visit Diagnosis: Muscle weakness (generalized)  Chronic midline low back pain without sciatica  Pain in left leg  Difficulty in walking, not elsewhere classified     Problem List Patient Active Problem List   Diagnosis Date Noted  . Encounter for screening colonoscopy 06/23/2016  . Hypertension 04/26/2015  . Dry mouth 04/26/2015  . Bell palsy 09/04/2014  . Obesity 09/04/2014  . Allergic rhinitis 08/04/2014  . Anxiety 08/04/2014  . Cannot sleep 08/04/2014  . Neuropathic pain 08/04/2014  . Atrial fibrillation ([place]) 08/04/2014  . Avitaminosis D 08/04/2014  . Gastroesophageal reflux disease without esophagitis 11/07/2013  . Polypharmacy 08/19/2013  . Osteoarthritis 06/11/2013  . Rheumatoid arthritis ([place]) 06/11/2013  . Hypercholesteremia 05/12/2009  . Benign essential tremor 05/12/2009  . Elevated blood sugar 08/26/2008  . Adult hypothyroidism 08/26/2008  . Menopausal symptom 08/26/2008    Jomarie Longs PT 07/22/2016, 12:27 PM  [place] [facility] [address] [place], [address] Phone: [phone]   Fax:  [phone]  Name: Glenda Williams MRN: [id number] Date of Birth: [birth year]

## 2016-07-24 ENCOUNTER — Ambulatory Visit: Payer: Medicare Other | Admitting: Physical Therapy

## 2016-07-24 ENCOUNTER — Encounter: Payer: Self-pay | Admitting: Physical Therapy

## 2016-07-24 DIAGNOSIS — G8929 Other chronic pain: Secondary | ICD-10-CM | POA: Diagnosis not present

## 2016-07-24 DIAGNOSIS — R262 Difficulty in walking, not elsewhere classified: Secondary | ICD-10-CM | POA: Diagnosis not present

## 2016-07-24 DIAGNOSIS — M6281 Muscle weakness (generalized): Secondary | ICD-10-CM

## 2016-07-24 DIAGNOSIS — M79605 Pain in left leg: Secondary | ICD-10-CM | POA: Diagnosis not present

## 2016-07-24 DIAGNOSIS — M545 Low back pain: Secondary | ICD-10-CM

## 2016-07-24 NOTE — Therapy (Signed)
[place] [facility] [address] [place], [address] Phone: [phone]   Fax:  [phone]  Physical Therapy Treatment  Patient Details  Name: Glenda Williams MRN: [id number] Date of Birth: 1940-12-09 Referring Provider: Leeanne Mannan., Eugene Gavia MD  Encounter Date: 07/24/2016      PT End of Session - 07/24/16 0918    Visit Number 7   Number of Visits 12   Date for PT Re-Evaluation 08/08/16   Authorization Type 7   Authorization Time Period 10 (G code)   PT Start Time 0915   PT Stop Time 0958   PT Time Calculation (min) 43 min   Activity Tolerance Patient tolerated treatment well   Behavior During Therapy [facility] for tasks assessed/performed      Past Medical History:  Diagnosis Date  . A-fib ([place])   . Arthritis   . GERD (gastroesophageal reflux disease)   . Heart disease   . Rheumatoid arthritis ([place])   . Thyroid disease   . Tremors of nervous system     Past Surgical History:  Procedure Laterality Date  . BREAST BIOPSY Left 1988   Benign  . BREAST BIOPSY Left 1987   Benign  . BROW LIFT Bilateral 05/23/2016   Procedure: BLEPHAROPLASTY upper eyelid; w/excess skin;  Surgeon: Karle Starch, MD;  Location: [place];  Service: Ophthalmology;  Laterality: Bilateral;  . CARPAL TUNNEL RELEASE Bilateral   . CHOLECYSTECTOMY     Dr. Bary Castilla  . COLONOSCOPY  2006  . JOINT REPLACEMENT Bilateral    knees  . REPLACEMENT TOTAL KNEE Left 2005  . REPLACEMENT TOTAL KNEE Right 1998    There were no vitals filed for this visit.      Subjective Assessment - 07/24/16 0915    Subjective Patient reports she is exercising and trying to drink more water and feels this helps with decreasing fluid retention in LE's   Pertinent History Patient reports she has had chronic back pain with arthritis and disc pain for years. She has had X rays. She reports she had a fall at home and tripped and fell forward into  computer table with left sided bruing and pain left arm, ribs. she went to MD and had X rays of left arm, and knees with no fractures reported. She reports she had back pain which was due to UTI in March  2018 and she has been sitting in a chair to sleep. She has only been sleeping in her bed for the past 3 days. Last week She noticed itching in her back, she had hives/rash from macrobid. She is currently is improving with rash.    Limitations Walking;House hold activities;Other (comment)  rising from sitting   How long can you sit comfortably? <1 hour   How long can you stand comfortably? no more than 1 hour   How long can you walk comfortably? < 1 hour   Diagnostic tests X rays back, LE's   Patient Stated Goals to be able to walk without difficulty, feeling unbalanced   Currently in Pain? Yes   Pain Score 3    Pain Location Back   Pain Orientation Right;Left;Lower;Upper   Pain Descriptors / Indicators Aching;Tightness;Sore   Pain Type Chronic pain   Pain Onset More than a month ago   Pain Frequency Intermittent        Objective: Gait; with SPC with good cadence and confidence with walking without LOB noted; without cane for support;  improved ability to maintain level pelvis as compared to previous session Strength: left LE decreased hip flexion 3-/5, knee extension 4-/5, flexion 4-/5  Treatment:  Therapeutic exercise: patient performed exercises with verbal, tactile cues and demonstration of therapist: goals: strength, independent with home program Sitting on treatment table:  Weight shifting performed with demonstration and VC arms in lap with weight shift to each hip x 20 reps with improved ROM and control noted with repetition Hip adduction with ball and glute sets x 15 reps Hip abduction with resistive band with single leg pressing out with opposite hip stabilizing x 15each Knee flexion with greenresistive band and assistance of therapist  x 15reps with reports of feeling like  her leg was going to cramp Hip flexion with green resistive band x 10reps right LE, 2 x 5 reps left LE due to fatigue, weakness Isometric stabilization of trunk for flexion/extension x 10 reps with moderate manual resistance given Standing: Step ups onto small balance stones leading with each LE x 12 reps with UE support on counter and contact guard of therapist for safety Staggered stance on balance stones with weight shift forward and back and then switch stance x 1 min. Each with UE support single and bilateral as needed Walk along staggered balance stones (5) x 5 reps with UE support bilaterally and close non contact guard of therapist for safety  Patient response to treatment: patient demonstrated improved technique with exercises with minimal VC for correct alignment. Required close supervision for safety with all standing, balance activities. Improved motor control with repetition and cuing          PT Education - 07/24/16 0917    Education provided Yes   Education Details instruciton in technique and proper alignment   Person(s) Educated Patient   Methods Explanation;Demonstration;Verbal cues   Comprehension Verbalized understanding;Returned demonstration;Verbal cues required             PT Long Term Goals - 06/27/16 1200      PT LONG TERM GOAL #1   Title Patient will report decreased pain level to <2/10 with prolonged sitting >1 hour indicating improved function by 08/08/2016   Baseline sitting <1 hour with increased pain in back >5/10   Status New     PT LONG TERM GOAL #2   Title Patient will demonstrate improved self perceived disability due to back symptoms with improved MODI of 25% or less by 08/08/2016   Baseline MODI 52%   Status New     PT LONG TERM GOAL #3   Title Patient will be independent with self managment of symptoms and posture awareness with home exercises without cuing in order return to prior level of function by 08/08/2016   Baseline limited/no  knowledge of appropriate pain control strategies, posture awareness/exercises   Status New               Plan - 07/24/16 0919    Clinical Impression Statement Patient is progressing steadily towards goals with current treatment. She demonstrates improvement with endurance and strength as demonstrated by ability to perform exercises with minimal rest periods and increased intensity. She should continue with additional physical therapy intervention.     Rehab Potential Good   Clinical Impairments Affecting Rehab Potential (+) motivated(-) chronic condition, obesity, osteoarthritis, rheumatoid arthritis   PT Frequency 2x / week   PT Duration 6 weeks   PT Treatment/Interventions Electrical Stimulation;Moist Heat;Patient/family education;Neuromuscular re-education;Therapeutic exercise;Manual techniques   PT Next Visit Plan pain control, reduction of spasms,  progressive exercise, manual techniques   PT Home Exercise Plan stabilization in sitting      Patient will benefit from skilled therapeutic intervention in order to improve the following deficits and impairments:  Decreased strength, Pain, Impaired perceived functional ability, Decreased activity tolerance, Decreased endurance, Difficulty walking  Visit Diagnosis: Muscle weakness (generalized)  Chronic midline low back pain without sciatica  Pain in left leg  Difficulty in walking, not elsewhere classified     Problem List Patient Active Problem List   Diagnosis Date Noted  . Encounter for screening colonoscopy 06/23/2016  . Hypertension 04/26/2015  . Dry mouth 04/26/2015  . Bell palsy 09/04/2014  . Obesity 09/04/2014  . Allergic rhinitis 08/04/2014  . Anxiety 08/04/2014  . Cannot sleep 08/04/2014  . Neuropathic pain 08/04/2014  . Atrial fibrillation ([place]) 08/04/2014  . Avitaminosis D 08/04/2014  . Gastroesophageal reflux disease without esophagitis 11/07/2013  . Polypharmacy 08/19/2013  . Osteoarthritis 06/11/2013   . Rheumatoid arthritis ([place]) 06/11/2013  . Hypercholesteremia 05/12/2009  . Benign essential tremor 05/12/2009  . Elevated blood sugar 08/26/2008  . Adult hypothyroidism 08/26/2008  . Menopausal symptom 08/26/2008    Jomarie Longs PT 07/24/2016, 12:25 PM  [place] [address], [place], [address] Phone: [phone]   Fax:  [phone]  Name: Glenda Williams MRN: [id number] Date of Birth: 1940/06/05

## 2016-07-26 ENCOUNTER — Encounter: Payer: Self-pay | Admitting: Physical Therapy

## 2016-07-26 ENCOUNTER — Ambulatory Visit: Payer: Medicare Other | Admitting: Physical Therapy

## 2016-07-26 DIAGNOSIS — G8929 Other chronic pain: Secondary | ICD-10-CM

## 2016-07-26 DIAGNOSIS — M6281 Muscle weakness (generalized): Secondary | ICD-10-CM | POA: Diagnosis not present

## 2016-07-26 DIAGNOSIS — R262 Difficulty in walking, not elsewhere classified: Secondary | ICD-10-CM | POA: Diagnosis not present

## 2016-07-26 DIAGNOSIS — M545 Low back pain, unspecified: Secondary | ICD-10-CM

## 2016-07-26 DIAGNOSIS — M79605 Pain in left leg: Secondary | ICD-10-CM | POA: Diagnosis not present

## 2016-07-26 NOTE — Therapy (Signed)
[facility] [address] [place], [address] Phone: [phone]   Fax:  [phone]  Physical Therapy Treatment  Patient Details  Name: Glenda Williams MRN: [id number] Date of Birth: [birth year] Referring Provider: Leeanne Mannan., Eugene Gavia MD  Encounter Date: 07/26/2016      PT End of Session - 07/26/16 1130    Visit Number 8   Number of Visits 12   Date for PT Re-Evaluation 08/08/16   Authorization Type 8   Authorization Time Period 10 (G code)   PT Start Time 1101   PT Stop Time 1130   PT Time Calculation (min) 29 min   Activity Tolerance Patient tolerated treatment well   Behavior During Therapy [facility] for tasks assessed/performed      Past Medical History:  Diagnosis Date  . A-fib ([place])   . Arthritis   . GERD (gastroesophageal reflux disease)   . Heart disease   . Rheumatoid arthritis ([place])   . Thyroid disease   . Tremors of nervous system     Past Surgical History:  Procedure Laterality Date  . BREAST BIOPSY Left 1988   Benign  . BREAST BIOPSY Left 1987   Benign  . BROW LIFT Bilateral 05/23/2016   Procedure: BLEPHAROPLASTY upper eyelid; w/excess skin;  Surgeon: Karle Starch, MD;  Location: [facility];  Service: Ophthalmology;  Laterality: Bilateral;  . CARPAL TUNNEL RELEASE Bilateral   . CHOLECYSTECTOMY     Dr. Bary Castilla  . COLONOSCOPY  2006  . JOINT REPLACEMENT Bilateral    knees  . REPLACEMENT TOTAL KNEE Left 2005  . REPLACEMENT TOTAL KNEE Right 1998    There were no vitals filed for this visit.      Subjective Assessment - 07/26/16 1102    Subjective legs stiff and more swollen today.   Pertinent History Patient reports she has had chronic back pain with arthritis and disc pain for years. She has had X rays. She reports she had a fall at home and tripped and fell forward into computer table with left sided bruing and pain left arm, ribs. she went to MD and had X rays of  left arm, and knees with no fractures reported. She reports she had back pain which was due to UTI in March  2018 and she has been sitting in a chair to sleep. She has only been sleeping in her bed for the past 3 days. Last week She noticed itching in her back, she had hives/rash from macrobid. She is currently is improving with rash.    Limitations Walking;House hold activities;Other (comment)  rising from sitting   How long can you sit comfortably? <1 hour   How long can you stand comfortably? no more than 1 hour   How long can you walk comfortably? < 1 hour   Diagnostic tests X rays back, LE's   Patient Stated Goals to be able to walk without difficulty, feeling unbalanced   Currently in Pain? Yes   Pain Score 3    Pain Location Back   Pain Orientation Right;Left;Upper;Lower;Mid   Pain Descriptors / Indicators Aching;Tightness;Sore   Pain Type Chronic pain   Pain Onset More than a month ago   Pain Frequency Intermittent      Objective: Gait; with SPC with decreased cadence and increased swelling in LE's  Treatment:  Therapeutic exercise: patient performed exercises with verbal, tactile cues and demonstration of therapist: goals: strength, independent with home program Sitting  in chair with moist heat applied to back during exercises (no adverse reaction to heat noted):  Hip adduction with ball and glute sets x 15 reps Hip abduction with resistive band bilateral LE  x 15 Knee flexion with greenresistive band and assistance of therapist  x 15reps without reports of legs cramping today Hip flexion with green resistive band x 10reps right LE, 2 x 5 reps left LE due to fatigue, weakness  Patient response to treatment: Modified exercises today with sitting in in chair due to patient having increased joint pain. She responded well without increased pain reported and left hip demonstrated improved endurance without increased tightening in thigh or hip during exercises. mproved motor  control with repetition and cuing         PT Education - 07/26/16 1130    Education Details modifying home exercises when legs are swollen and joints are hurting   Person(s) Educated Patient   Methods Explanation   Comprehension Verbalized understanding             PT Long Term Goals - 06/27/16 1200      PT LONG TERM GOAL #1   Title Patient will report decreased pain level to <2/10 with prolonged sitting >1 hour indicating improved function by 08/08/2016   Baseline sitting <1 hour with increased pain in back >5/10   Status New     PT LONG TERM GOAL #2   Title Patient will demonstrate improved self perceived disability due to back symptoms with improved MODI of 25% or less by 08/08/2016   Baseline MODI 52%   Status New     PT LONG TERM GOAL #3   Title Patient will be independent with self managment of symptoms and posture awareness with home exercises without cuing in order return to prior level of function by 08/08/2016   Baseline limited/no knowledge of appropriate pain control strategies, posture awareness/exercises   Status New               Plan - 07/26/16 1130    Clinical Impression Statement Patient demonstrated good knowledge of home exercise modificaiton dependeing on her symptoms. She continues with decreased strength and endurance and will benefit from additional physical therapy intervention to achieve goals.     Rehab Potential Good   Clinical Impairments Affecting Rehab Potential (+) motivated(-) chronic condition, obesity, osteoarthritis, rheumatoid arthritis   PT Frequency 2x / week   PT Duration 6 weeks   PT Treatment/Interventions Electrical Stimulation;Moist Heat;Patient/family education;Neuromuscular re-education;Therapeutic exercise;Manual techniques   PT Next Visit Plan pain control, reduction of spasms, progressive exercise, manual techniques   PT Home Exercise Plan stabilization in sitting      Patient will benefit from skilled therapeutic  intervention in order to improve the following deficits and impairments:  Decreased strength, Pain, Impaired perceived functional ability, Decreased activity tolerance, Decreased endurance, Difficulty walking  Visit Diagnosis: Muscle weakness (generalized)  Chronic midline low back pain without sciatica  Pain in left leg  Difficulty in walking, not elsewhere classified     Problem List Patient Active Problem List   Diagnosis Date Noted  . Encounter for screening colonoscopy 06/23/2016  . Hypertension 04/26/2015  . Dry mouth 04/26/2015  . Bell palsy 09/04/2014  . Obesity 09/04/2014  . Allergic rhinitis 08/04/2014  . Anxiety 08/04/2014  . Cannot sleep 08/04/2014  . Neuropathic pain 08/04/2014  . Atrial fibrillation ([place]) 08/04/2014  . Avitaminosis D 08/04/2014  . Gastroesophageal reflux disease without esophagitis 11/07/2013  . Polypharmacy 08/19/2013  .  Osteoarthritis 06/11/2013  . Rheumatoid arthritis ([place]) 06/11/2013  . Hypercholesteremia 05/12/2009  . Benign essential tremor 05/12/2009  . Elevated blood sugar 08/26/2008  . Adult hypothyroidism 08/26/2008  . Menopausal symptom 08/26/2008    Jomarie Longs PT 07/27/2016, 1:51 PM  [facility] [address], [place], [address] Phone: [phone]   Fax:  [phone]  Name: MURRY KHIEV MRN: [id number] Date of Birth: Apr 26, 1940

## 2016-07-28 ENCOUNTER — Encounter: Payer: Medicare Other | Admitting: Physical Therapy

## 2016-08-02 ENCOUNTER — Ambulatory Visit: Payer: Medicare Other | Admitting: Physical Therapy

## 2016-08-02 ENCOUNTER — Encounter: Payer: Self-pay | Admitting: Physical Therapy

## 2016-08-02 DIAGNOSIS — G8929 Other chronic pain: Secondary | ICD-10-CM | POA: Diagnosis not present

## 2016-08-02 DIAGNOSIS — M79605 Pain in left leg: Secondary | ICD-10-CM | POA: Diagnosis not present

## 2016-08-02 DIAGNOSIS — M545 Low back pain: Secondary | ICD-10-CM | POA: Diagnosis not present

## 2016-08-02 DIAGNOSIS — M6281 Muscle weakness (generalized): Secondary | ICD-10-CM

## 2016-08-02 DIAGNOSIS — R262 Difficulty in walking, not elsewhere classified: Secondary | ICD-10-CM

## 2016-08-03 ENCOUNTER — Encounter: Payer: Self-pay | Admitting: *Deleted

## 2016-08-03 NOTE — Therapy (Signed)
[facility] [address] [place], [address] Phone: [phone]   Fax:  [phone]  Physical Therapy Treatment  Patient Details  Name: Glenda Williams MRN: [id number] Date of Birth: 1941/02/05 Referring Provider: Leeanne Mannan., Eugene Gavia MD  Encounter Date: 08/02/2016      PT End of Session - 08/02/16 1107    Visit Number 9   Number of Visits 12   Date for PT Re-Evaluation 08/08/16   Authorization Type 9   Authorization Time Period 10 (G code)   PT Start Time 1101   PT Stop Time 1130   PT Time Calculation (min) 29 min   Activity Tolerance Patient tolerated treatment well   Behavior During Therapy [facility] for tasks assessed/performed      Past Medical History:  Diagnosis Date  . A-fib ([place])   . Arthritis   . GERD (gastroesophageal reflux disease)   . Heart disease   . Rheumatoid arthritis ([place])   . Thyroid disease   . Tremors of nervous system     Past Surgical History:  Procedure Laterality Date  . BREAST BIOPSY Left 1988   Benign  . BREAST BIOPSY Left 1987   Benign  . BROW LIFT Bilateral 05/23/2016   Procedure: BLEPHAROPLASTY upper eyelid; w/excess skin;  Surgeon: Karle Starch, MD;  Location: [facility];  Service: Ophthalmology;  Laterality: Bilateral;  . CARPAL TUNNEL RELEASE Bilateral   . CHOLECYSTECTOMY     Dr. Bary Castilla  . COLONOSCOPY  2006  . JOINT REPLACEMENT Bilateral    knees  . REPLACEMENT TOTAL KNEE Left 2005  . REPLACEMENT TOTAL KNEE Right 1998    There were no vitals filed for this visit.      Subjective Assessment - 08/02/16 1105    Subjective Patient with left shoulder and arm pain today. she reports she is walking with less difficulty today.    Pertinent History Patient reports she has had chronic back pain with arthritis and disc pain for years. She has had X rays. She reports she had a fall at home and tripped and fell forward into computer table with left  sided bruing and pain left arm, ribs. she went to MD and had X rays of left arm, and knees with no fractures reported. She reports she had back pain which was due to UTI in March  2018 and she has been sitting in a chair to sleep. She has only been sleeping in her bed for the past 3 days. Last week She noticed itching in her back, she had hives/rash from macrobid. She is currently is improving with rash.    Limitations Walking;House hold activities;Other (comment)  rising from sitting   How long can you sit comfortably? <1 hour   How long can you stand comfortably? no more than 1 hour   How long can you walk comfortably? < 1 hour   Diagnostic tests X rays back, LE's   Patient Stated Goals to be able to walk without difficulty, feeling unbalanced   Currently in Pain? Yes   Pain Score 3    Pain Location Back   Pain Orientation Right;Left;Upper;Lower   Pain Descriptors / Indicators Aching   Pain Type Chronic pain   Pain Onset More than a month ago   Pain Frequency Intermittent      Objective: Gait; improved gait pattern with improved weight shifting and decreased hip drop on right Strength: left hip flexion 3+/5, hip abduction 4/5,  knee flexion 4/5, extension 4/5  Treatment:  Therapeutic exercise: patient performed exercises with verbal, tactile cues and demonstration of therapist: goals: strength, independent with home program Sitting in chair with moist heat applied to back during exercises (no adverse reaction to heat noted):  Hip adduction with ball and glute sets x 15 reps Hip abduction with resistive band bilateral LE  x 15 Knee flexion with greenresistive band and assistance of therapist x 15reps without reports of legs cramping today Hip flexion with green resistive band x 10reps right LE, 2 x 5 reps left LE due to fatigue, weakness Step ups onto balance stones 10 reps leading with each LE  Patient response to treatment: improved technique and ability to perform some  exercises in standing with less pain in left LE and hip today.            PT Education - 08/02/16 1106    Education provided Yes   Education Details continue with exercises as tolerated and symptoms allow   Person(s) Educated Patient   Methods Explanation   Comprehension Verbalized understanding             PT Long Term Goals - 06/27/16 1200      PT LONG TERM GOAL #1   Title Patient will report decreased pain level to <2/10 with prolonged sitting >1 hour indicating improved function by 08/08/2016   Baseline sitting <1 hour with increased pain in back >5/10   Status New     PT LONG TERM GOAL #2   Title Patient will demonstrate improved self perceived disability due to back symptoms with improved MODI of 25% or less by 08/08/2016   Baseline MODI 52%   Status New     PT LONG TERM GOAL #3   Title Patient will be independent with self managment of symptoms and posture awareness with home exercises without cuing in order return to prior level of function by 08/08/2016   Baseline limited/no knowledge of appropriate pain control strategies, posture awareness/exercises   Status New               Plan - 08/02/16 1107    Clinical Impression Statement Patient continues with steady progress towards goals and improving abilityy to walk with less difficutly and symptoms. She should continue to improve with additional physical therapy intervention.    Rehab Potential Good   Clinical Impairments Affecting Rehab Potential (+) motivated(-) chronic condition, obesity, osteoarthritis, rheumatoid arthritis   PT Frequency 2x / week   PT Duration 6 weeks   PT Treatment/Interventions Electrical Stimulation;Moist Heat;Patient/family education;Neuromuscular re-education;Therapeutic exercise;Manual techniques   PT Next Visit Plan pain control, reduction of spasms, progressive exercise, manual techniques   PT Home Exercise Plan stabilization in sitting      Patient will benefit from skilled  therapeutic intervention in order to improve the following deficits and impairments:  Decreased strength, Pain, Impaired perceived functional ability, Decreased activity tolerance, Decreased endurance, Difficulty walking  Visit Diagnosis: Muscle weakness (generalized)  Chronic midline low back pain without sciatica  Pain in left leg  Difficulty in walking, not elsewhere classified     Problem List Patient Active Problem List   Diagnosis Date Noted  . Encounter for screening colonoscopy 06/23/2016  . Hypertension 04/26/2015  . Dry mouth 04/26/2015  . Bell palsy 09/04/2014  . Obesity 09/04/2014  . Allergic rhinitis 08/04/2014  . Anxiety 08/04/2014  . Cannot sleep 08/04/2014  . Neuropathic pain 08/04/2014  . Atrial fibrillation ([place]) 08/04/2014  . Avitaminosis D  08/04/2014  . Gastroesophageal reflux disease without esophagitis 11/07/2013  . Polypharmacy 08/19/2013  . Osteoarthritis 06/11/2013  . Rheumatoid arthritis ([place]) 06/11/2013  . Hypercholesteremia 05/12/2009  . Benign essential tremor 05/12/2009  . Elevated blood sugar 08/26/2008  . Adult hypothyroidism 08/26/2008  . Menopausal symptom 08/26/2008    Jomarie Longs PT 08/03/2016, 3:03 PM  [facility] [address] [place], [address] Phone: [phone]   Fax:  [phone]  Name: PAMLEA FINDER MRN: [id number] Date of Birth: 1940-06-30

## 2016-08-07 ENCOUNTER — Ambulatory Visit: Payer: Medicare Other | Admitting: Physical Therapy

## 2016-08-09 ENCOUNTER — Ambulatory Visit: Payer: Medicare Other | Attending: Rheumatology | Admitting: Physical Therapy

## 2016-08-09 ENCOUNTER — Ambulatory Visit: Admission: RE | Admit: 2016-08-09 | Payer: Medicare Other | Source: Ambulatory Visit | Admitting: General Surgery

## 2016-08-09 ENCOUNTER — Encounter: Admission: RE | Payer: Self-pay | Source: Ambulatory Visit

## 2016-08-09 DIAGNOSIS — M6281 Muscle weakness (generalized): Secondary | ICD-10-CM | POA: Insufficient documentation

## 2016-08-09 DIAGNOSIS — R262 Difficulty in walking, not elsewhere classified: Secondary | ICD-10-CM | POA: Insufficient documentation

## 2016-08-09 DIAGNOSIS — M79605 Pain in left leg: Secondary | ICD-10-CM | POA: Insufficient documentation

## 2016-08-09 DIAGNOSIS — G8929 Other chronic pain: Secondary | ICD-10-CM | POA: Insufficient documentation

## 2016-08-09 DIAGNOSIS — M545 Low back pain: Secondary | ICD-10-CM | POA: Insufficient documentation

## 2016-08-09 SURGERY — COLONOSCOPY WITH PROPOFOL
Anesthesia: General

## 2016-08-14 ENCOUNTER — Encounter: Payer: Medicare Other | Admitting: Physical Therapy

## 2016-08-14 DIAGNOSIS — M0579 Rheumatoid arthritis with rheumatoid factor of multiple sites without organ or systems involvement: Secondary | ICD-10-CM | POA: Diagnosis not present

## 2016-08-14 DIAGNOSIS — Z79899 Other long term (current) drug therapy: Secondary | ICD-10-CM | POA: Diagnosis not present

## 2016-08-16 ENCOUNTER — Ambulatory Visit: Payer: Medicare Other | Admitting: Physical Therapy

## 2016-08-16 ENCOUNTER — Encounter: Payer: Medicare Other | Admitting: Physical Therapy

## 2016-08-16 ENCOUNTER — Encounter: Payer: Self-pay | Admitting: Physical Therapy

## 2016-08-16 DIAGNOSIS — M545 Low back pain, unspecified: Secondary | ICD-10-CM

## 2016-08-16 DIAGNOSIS — G8929 Other chronic pain: Secondary | ICD-10-CM

## 2016-08-16 DIAGNOSIS — M6281 Muscle weakness (generalized): Secondary | ICD-10-CM

## 2016-08-16 DIAGNOSIS — R262 Difficulty in walking, not elsewhere classified: Secondary | ICD-10-CM | POA: Diagnosis not present

## 2016-08-16 DIAGNOSIS — M79605 Pain in left leg: Secondary | ICD-10-CM

## 2016-08-16 NOTE — Therapy (Signed)
[facility] [address], [place], [address] Phone: [phone]   Fax:  [phone]  Physical Therapy Treatment  Patient Details  Name: Glenda Williams MRN: [id number] Date of Birth: 09/30/1940 Referring Provider: Leeanne Mannan., Eugene Gavia MD  Encounter Date: 08/16/2016      PT End of Session - 08/16/16 1025    Visit Number 10   Number of Visits 24   Date for PT Re-Evaluation 09/27/16   Authorization Type 10   Authorization Time Period 10 (G code)   PT Start Time 0930   PT Stop Time 1022   PT Time Calculation (min) 52 min   Activity Tolerance Patient tolerated treatment well   Behavior During Therapy [facility] for tasks assessed/performed      Past Medical History:  Diagnosis Date  . A-fib ([place])   . Arthritis   . GERD (gastroesophageal reflux disease)   . Heart disease   . Rheumatoid arthritis ([place])   . Thyroid disease   . Tremors of nervous system     Past Surgical History:  Procedure Laterality Date  . BREAST BIOPSY Left 1988   Benign  . BREAST BIOPSY Left 1987   Benign  . BROW LIFT Bilateral 05/23/2016   Procedure: BLEPHAROPLASTY upper eyelid; w/excess skin;  Surgeon: Karle Starch, MD;  Location: [facility];  Service: Ophthalmology;  Laterality: Bilateral;  . CARPAL TUNNEL RELEASE Bilateral   . CHOLECYSTECTOMY     Dr. Bary Castilla  . COLONOSCOPY  2006  . JOINT REPLACEMENT Bilateral    knees  . REPLACEMENT TOTAL KNEE Left 2005  . REPLACEMENT TOTAL KNEE Right 1998    There were no vitals filed for this visit.      Subjective Assessment - 08/16/16 0933    Subjective Patient reports she cancelled last session due to family issues and she is returning to therapy today. She reports she is feeling stronger in legs since beginning therapy but would like to continue working on walking and standing endurance with sterngthening to improve confidence with walking; currently 50% confident with  walking in community.   Pertinent History Patient reports she has had chronic back pain with arthritis and disc pain for years. She has had X rays. She reports she had a fall at home and tripped and fell forward into computer table with left sided bruing and pain left arm, ribs. she went to MD and had X rays of left arm, and knees with no fractures reported. She reports she had back pain which was due to UTI in March  2018 and she has been sitting in a chair to sleep. She has only been sleeping in her bed for the past 3 days. Last week She noticed itching in her back, she had hives/rash from macrobid. She is currently is improving with rash.    Limitations Walking;House hold activities;Other (comment)  rising from sitting   How long can you sit comfortably? <1 hour   How long can you stand comfortably? no more than 1 hour   How long can you walk comfortably? < 1 hour   Diagnostic tests X rays back, LE's   Patient Stated Goals to be able to walk without difficulty, feeling unbalanced   Currently in Pain? No/denies     Objective: Gait; ambulating with SPC with limp on left LE Outcome measures: 5x sit to stand standard height chair, without UE use: 18.3 seconds (goal 15 seconds) 10MW 14 seconds (goal: 10  seconds or less for low fall risk and functional community ambulation) TUG: 16.3 seconds with or without SPC ( goal: 13 seconds for decreased fall risk) Confidence: patient reports overall 50% confidence with walking and moving for fear of falling (goal 80% or better) Strength: LE's hip flexion right 4/5, left 4-/5; knee extension 4/5 bilateral; knee flexion 4/5 bilateral; hip abduction right 4/5, left 3+/5, extension 3+/5 bilateral  Treatment: Therapeutic exercise: patient performed with VC, demonstration and guidance of therapist: goal: independent with home program, strength Sitting: both LE's Hip flexion right 2 x 10, left 3 x 5 with fatigue at 5 reps Hip abduction (clam) with green  resistive band x 15 and then x 10 each single with left LE requiring increased effort to complete Knee flexion with greenresistive band and assistance of therapist x 15reps without reports of legs cramping today Hip flexion with green resistive band x 10reps right LE, 2 x 5 reps left LE due to fatigue, weakness Step ups onto balance stones 10 reps leading with each LE Side stepping along airex beam x 2 min. With close supervision for safety and using UEs support on counter  Patient response to treatment:IMporved technique, alignment with minimal cuing and instruction. Improved motor control with repetition.        PT Education - 08/16/16 1015    Education provided Yes   Education Details exercise instruction with correct alignment and technique   Person(s) Educated Patient   Methods Explanation;Demonstration;Verbal cues   Comprehension Verbalized understanding;Returned demonstration;Verbal cues required             PT Long Term Goals - 08/16/16 1025      PT LONG TERM GOAL #1   Title Patient will report decreased pain level to <2/10 with prolonged sitting >1 hour indicating improved function by 08/08/2016   Baseline sitting <1 hour with increased pain in back >5/10   Status Partially Met     PT LONG TERM GOAL #2   Title Patient will demonstrate improved self perceived disability due to back symptoms with improved MODI of 25% or less by 09/27/2016   Baseline MODI 52%; MODI 40%   Status Revised     PT LONG TERM GOAL #3   Title Patient will demonstrate improved 10MW to 66ms or better by 09/27/2016 to demonstrate improved community ambulation   Baseline 10MW 14.5 seconds (.69 m/s)   Status New     PT LONG TERM GOAL #4   Title Patient will demonstrate improved TUG to 13 seconds or better by 09/27/2016 Demonstrating decreased fall risk   Baseline TUG 16.3 seconds   Status New     PT LONG TERM GOAL #5   Title Patient will be independent with home program for flexiblity,  strength and balance exercises by 09/27/2016 to allow discharge from physical therapy with self management   Baseline patient requires assistance, VC to perform exercises with correct technique/alignment   Status Revised               Plan - 08/16/16 1025    Clinical Impression Statement Patient is progressing steadily with improvements noted in strength and endurance which is assisting to lower her fall risk and improve confidence with walking in community. Her current outcome measures for 10MW, TUG and 5x sit to stand indicate the need for further intervention as well as her reported confidence level of 50% for feeling balanced with activties and walking in community. she will benefit from continued physical therapy intervention to further improve  strength, endurance in order to achieve maximal function and safety with ambulation.    Rehab Potential Good   Clinical Impairments Affecting Rehab Potential (+) motivated(-) chronic condition, obesity, osteoarthritis, rheumatoid arthritis   PT Frequency 2x / week   PT Duration 6 weeks   PT Treatment/Interventions Electrical Stimulation;Moist Heat;Patient/family education;Neuromuscular re-education;Therapeutic exercise;Manual techniques   PT Next Visit Plan progressive strengthening, endurance and balance exercises   PT Home Exercise Plan stabilization in sitting, standing, walking exercises/activities   Consulted and Agree with Plan of Care Patient      Patient will benefit from skilled therapeutic intervention in order to improve the following deficits and impairments:  Decreased strength, Pain, Impaired perceived functional ability, Decreased activity tolerance, Decreased endurance, Difficulty walking  Visit Diagnosis: Difficulty in walking, not elsewhere classified - Plan: PT plan of care cert/re-cert  Muscle weakness (generalized) - Plan: PT plan of care cert/re-cert  Chronic midline low back pain without sciatica - Plan: PT plan of  care cert/re-cert  Pain in left leg - Plan: PT plan of care cert/re-cert       G-Codes - 20-Aug-2016 1021    Functional Assessment Tool Used (Outpatient Only) MODI, 5x sit to stand, pain, strength, TUG, 10MW, clinical judgment   Functional Limitation Mobility: Walking and moving around   Mobility: Walking and Moving Around Current Status (K1031) At least 40 percent but less than 60 percent impaired, limited or restricted   Mobility: Walking and Moving Around Goal Status [id number]) At least 20 percent but less than 40 percent impaired, limited or restricted      Problem List Patient Active Problem List   Diagnosis Date Noted  . Encounter for screening colonoscopy 06/23/2016  . Hypertension 04/26/2015  . Dry mouth 04/26/2015  . Bell palsy 09/04/2014  . Obesity 09/04/2014  . Allergic rhinitis 08/04/2014  . Anxiety 08/04/2014  . Cannot sleep 08/04/2014  . Neuropathic pain 08/04/2014  . Atrial fibrillation ([place]) 08/04/2014  . Avitaminosis D 08/04/2014  . Gastroesophageal reflux disease without esophagitis 11/07/2013  . Polypharmacy 08/19/2013  . Osteoarthritis 06/11/2013  . Rheumatoid arthritis ([place]) 06/11/2013  . Hypercholesteremia 05/12/2009  . Benign essential tremor 05/12/2009  . Elevated blood sugar 08/26/2008  . Adult hypothyroidism 08/26/2008  . Menopausal symptom 08/26/2008    Jomarie Longs PT 08/17/2016, 2:55 PM  [facility] [address], [place], [address] Phone: [phone]   Fax:  [phone]  Name: Glenda Williams MRN: [id number] Date of Birth: 04-Jul-1940

## 2016-08-21 ENCOUNTER — Encounter: Payer: Medicare Other | Admitting: Physical Therapy

## 2016-08-22 ENCOUNTER — Encounter: Payer: Self-pay | Admitting: Physical Therapy

## 2016-08-22 ENCOUNTER — Ambulatory Visit: Payer: Medicare Other | Admitting: Physical Therapy

## 2016-08-22 DIAGNOSIS — M6281 Muscle weakness (generalized): Secondary | ICD-10-CM | POA: Diagnosis not present

## 2016-08-22 DIAGNOSIS — G8929 Other chronic pain: Secondary | ICD-10-CM | POA: Diagnosis not present

## 2016-08-22 DIAGNOSIS — M79605 Pain in left leg: Secondary | ICD-10-CM | POA: Diagnosis not present

## 2016-08-22 DIAGNOSIS — M545 Low back pain: Secondary | ICD-10-CM

## 2016-08-22 DIAGNOSIS — R262 Difficulty in walking, not elsewhere classified: Secondary | ICD-10-CM | POA: Diagnosis not present

## 2016-08-23 ENCOUNTER — Encounter: Payer: Medicare Other | Admitting: Physical Therapy

## 2016-08-23 NOTE — Therapy (Signed)
[place] [facility] [address] [place], [address] Phone: [phone]   Fax:  [phone]  Physical Therapy Treatment  Patient Details  Name: Glenda Williams MRN: [id number] Date of Birth: 09/09/1940 Referring Provider: Leeanne Mannan., Eugene Gavia MD  Encounter Date: 08/22/2016      PT End of Session - 08/22/16 1144    Visit Number 11   Number of Visits 24   Date for PT Re-Evaluation 09/27/16   Authorization Type 11   Authorization Time Period 10 (G code)   PT Start Time 1137   PT Stop Time 1225   PT Time Calculation (min) 48 min   Activity Tolerance Patient tolerated treatment well   Behavior During Therapy [facility] for tasks assessed/performed      Past Medical History:  Diagnosis Date  . A-fib ([place])   . Arthritis   . GERD (gastroesophageal reflux disease)   . Heart disease   . Rheumatoid arthritis ([place])   . Thyroid disease   . Tremors of nervous system     Past Surgical History:  Procedure Laterality Date  . BREAST BIOPSY Left 1988   Benign  . BREAST BIOPSY Left 1987   Benign  . BROW LIFT Bilateral 05/23/2016   Procedure: BLEPHAROPLASTY upper eyelid; w/excess skin;  Surgeon: Karle Starch, MD;  Location: [place];  Service: Ophthalmology;  Laterality: Bilateral;  . CARPAL TUNNEL RELEASE Bilateral   . CHOLECYSTECTOMY     Dr. Bary Castilla  . COLONOSCOPY  2006  . JOINT REPLACEMENT Bilateral    knees  . REPLACEMENT TOTAL KNEE Left 2005  . REPLACEMENT TOTAL KNEE Right 1998    There were no vitals filed for this visit.      Subjective Assessment - 08/22/16 1139    Subjective Patient reports she has been having increased joint stiffness and pain due to rheumatoid arthritis. She feels increased stiffness following prolonged sitting. she is noticing improvemnt with walking abiltiy and strength. Improving abiltiy to get in/out of car and truck.    Pertinent History Patient reports she has had  chronic back pain with arthritis and disc pain for years. She has had X rays. She reports she had a fall at home and tripped and fell forward into computer table with left sided bruing and pain left arm, ribs. she went to MD and had X rays of left arm, and knees with no fractures reported. She reports she had back pain which was due to UTI in March  2018 and she has been sitting in a chair to sleep. She has only been sleeping in her bed for the past 3 days. Last week She noticed itching in her back, she had hives/rash from macrobid. She is currently is improving with rash.    Limitations Walking;House hold activities;Other (comment)  rising from sitting   How long can you sit comfortably? <1 hour   How long can you stand comfortably? no more than 1 hour   How long can you walk comfortably? < 1 hour   Diagnostic tests X rays back, LE's   Patient Stated Goals to be able to walk without difficulty, feeling unbalanced   Currently in Pain? Other (Comment)  stiffness and discomfort left shoulder, hip and back        Objective: Gait; ambulating with SPC with mild limp on left LE with right hip drop with stance left LE; decreased hip abductor and ER strength left hip  Treatment: Therapeutic exercise:  patient performed with VC, demonstration and guidance of therapist: goal: independent with home program, strength Sitting: both LE's Rocker boar for DF/PF and side to side x 1 min. Each Knee extension 2# on ankles 2 x 15 Knee flexion green resistive band 2 x 20 Hip abduction (clam) with green resistive band x 20 and then x 15 each single with left LE requiring increased effort to complete Step ups onto balance stones 10 reps leading with each LE Side stepping along airex beam x 2 min. With close supervision for safety and using UEs support on counter Sit to stand from elevated surface (24") 2 x 10 reps without use of UE's and without reports of pain in hips/LE's  Patient response to treatment:  Patient demonstrated improved endurance and strength with good technique with exercises with minimal VC and assistance. Improved motor control with standing and walking exercise with repetition          PT Education - 08/22/16 1144    Education provided Yes   Education Details exercise instruction for technique and proper alignment    Person(s) Educated Patient   Methods Explanation;Demonstration;Verbal cues   Comprehension Verbalized understanding;Returned demonstration;Verbal cues required             PT Long Term Goals - 08/16/16 1025      PT LONG TERM GOAL #1   Title Patient will report decreased pain level to <2/10 with prolonged sitting >1 hour indicating improved function by 08/08/2016   Baseline sitting <1 hour with increased pain in back >5/10   Status Partially Met     PT LONG TERM GOAL #2   Title Patient will demonstrate improved self perceived disability due to back symptoms with improved MODI of 25% or less by 09/27/2016   Baseline MODI 52%; MODI 40%   Status Revised     PT LONG TERM GOAL #3   Title Patient will demonstrate improved 10MW to 45ms or better by 09/27/2016 to demonstrate improved community ambulation   Baseline 10MW 14.5 seconds (.69 m/s)   Status New     PT LONG TERM GOAL #4   Title Patient will demonstrate improved TUG to 13 seconds or better by 09/27/2016 Demonstrating decreased fall risk   Baseline TUG 16.3 seconds   Status New     PT LONG TERM GOAL #5   Title Patient will be independent with home program for flexiblity, strength and balance exercises by 09/27/2016 to allow discharge from physical therapy with self management   Baseline patient requires assistance, VC to perform exercises with correct technique/alignment   Status Revised               Plan - 08/22/16 1233    Clinical Impression Statement Patient demonstrates steady improvement with strength and endurance with improved ability to perform all exercises with mild fatigue  and minimal resting periods. She conitnues with weakness in left LE>right LE and difficulty with walking without AD and will therefore benefit from continued physical therapy intervention to achieve goals..   Rehab Potential Good   Clinical Impairments Affecting Rehab Potential (+) motivated(-) chronic condition, obesity, osteoarthritis, rheumatoid arthritis   PT Frequency 2x / week   PT Duration 6 weeks   PT Treatment/Interventions Electrical Stimulation;Moist Heat;Patient/family education;Neuromuscular re-education;Therapeutic exercise;Manual techniques   PT Next Visit Plan progressive strengthening, endurance and balance exercises   PT Home Exercise Plan stabilization in sitting, standing, walking exercises/activities   Consulted and Agree with Plan of Care Patient  Patient will benefit from skilled therapeutic intervention in order to improve the following deficits and impairments:  Decreased strength, Pain, Impaired perceived functional ability, Decreased activity tolerance, Decreased endurance, Difficulty walking  Visit Diagnosis: Muscle weakness (generalized)  Difficulty in walking, not elsewhere classified  Chronic midline low back pain without sciatica     Problem List Patient Active Problem List   Diagnosis Date Noted  . Encounter for screening colonoscopy 06/23/2016  . Hypertension 04/26/2015  . Dry mouth 04/26/2015  . Bell palsy 09/04/2014  . Obesity 09/04/2014  . Allergic rhinitis 08/04/2014  . Anxiety 08/04/2014  . Cannot sleep 08/04/2014  . Neuropathic pain 08/04/2014  . Atrial fibrillation ([place]) 08/04/2014  . Avitaminosis D 08/04/2014  . Gastroesophageal reflux disease without esophagitis 11/07/2013  . Polypharmacy 08/19/2013  . Osteoarthritis 06/11/2013  . Rheumatoid arthritis ([place]) 06/11/2013  . Hypercholesteremia 05/12/2009  . Benign essential tremor 05/12/2009  . Elevated blood sugar 08/26/2008  . Adult hypothyroidism 08/26/2008  . Menopausal  symptom 08/26/2008    Jomarie Longs PT 08/23/2016, 11:35 AM  [place] [facility] [address] [place], [address] Phone: [phone]   Fax:  [phone]  Name: MARKEYA MINCY MRN: [id number] Date of Birth: 1940-10-27

## 2016-08-24 ENCOUNTER — Encounter: Payer: Self-pay | Admitting: Physical Therapy

## 2016-08-24 ENCOUNTER — Ambulatory Visit: Payer: Medicare Other | Admitting: Physical Therapy

## 2016-08-24 DIAGNOSIS — R262 Difficulty in walking, not elsewhere classified: Secondary | ICD-10-CM | POA: Diagnosis not present

## 2016-08-24 DIAGNOSIS — G8929 Other chronic pain: Secondary | ICD-10-CM

## 2016-08-24 DIAGNOSIS — M545 Low back pain, unspecified: Secondary | ICD-10-CM

## 2016-08-24 DIAGNOSIS — M79605 Pain in left leg: Secondary | ICD-10-CM | POA: Diagnosis not present

## 2016-08-24 DIAGNOSIS — M6281 Muscle weakness (generalized): Secondary | ICD-10-CM | POA: Diagnosis not present

## 2016-08-24 NOTE — Therapy (Signed)
[place] [facility] [address] [place], [address] Phone: [phone]   Fax:  [phone]  Physical Therapy Treatment  Patient Details  Name: Glenda Williams MRN: [id number] Date of Birth: 05/06/1940 Referring Provider: Leeanne Mannan., Eugene Gavia MD  Encounter Date: 08/24/2016      PT End of Session - 08/24/16 1138    Visit Number 12   Number of Visits 24   Date for PT Re-Evaluation 09/27/16   Authorization Type 12   Authorization Time Period 10 (G code)   PT Start Time 1130   PT Stop Time 1210   PT Time Calculation (min) 40 min   Activity Tolerance Patient tolerated treatment well   Behavior During Therapy [facility] for tasks assessed/performed      Past Medical History:  Diagnosis Date  . A-fib ([place])   . Arthritis   . GERD (gastroesophageal reflux disease)   . Heart disease   . Rheumatoid arthritis ([place])   . Thyroid disease   . Tremors of nervous system     Past Surgical History:  Procedure Laterality Date  . BREAST BIOPSY Left 1988   Benign  . BREAST BIOPSY Left 1987   Benign  . BROW LIFT Bilateral 05/23/2016   Procedure: BLEPHAROPLASTY upper eyelid; w/excess skin;  Surgeon: Karle Starch, MD;  Location: [place];  Service: Ophthalmology;  Laterality: Bilateral;  . CARPAL TUNNEL RELEASE Bilateral   . CHOLECYSTECTOMY     Dr. Bary Castilla  . COLONOSCOPY  2006  . JOINT REPLACEMENT Bilateral    knees  . REPLACEMENT TOTAL KNEE Left 2005  . REPLACEMENT TOTAL KNEE Right 1998    There were no vitals filed for this visit.      Subjective Assessment - 08/24/16 1133    Subjective Patient reports she is feeling a little "swimmy headed" today and has been doing quite a bit over the past few days and may have sinus issues today that may be contributing to this.    Pertinent History Patient reports she has had chronic back pain with arthritis and disc pain for years. She has had X rays. She reports  she had a fall at home and tripped and fell forward into computer table with left sided bruing and pain left arm, ribs. she went to MD and had X rays of left arm, and knees with no fractures reported. She reports she had back pain which was due to UTI in March  2018 and she has been sitting in a chair to sleep. She has only been sleeping in her bed for the past 3 days. Last week She noticed itching in her back, she had hives/rash from macrobid. She is currently is improving with rash.    Limitations Walking;House hold activities;Other (comment)  rising from sitting   How long can you sit comfortably? <1 hour   How long can you stand comfortably? no more than 1 hour   How long can you walk comfortably? < 1 hour   Diagnostic tests X rays back, LE's   Patient Stated Goals to be able to walk without difficulty, feeling unbalanced   Currently in Pain? Other (Comment)  left shoulder still sore and aching        Objective: Gait; SPC, good cadence, mild limp to left with right pelvis drop  BP: pre treatment left UE patient seated in chair with back supported: 117/78, HR 63, SPO2 98%  Treatment: monitored HR and SPO2 throughout session  Therapeutic exercise: patient performed with VC, demonstration and guidance of therapist: goal: independent with home program, strength Sitting: both LE's Rocker boar for DF/PF and side to side x 1 min. Each Knee extension 2# on ankles 2 x 15 Knee flexion green resistive band 2 x 20 Hip abduction (clam) with green resistive band x 20 and then x 15 each single with left LE requiring increased effort to complete Step ups onto balance stones 10 reps leading with each LE Side stepping along airex beam x 2 min. With close supervision for safety and using UEs support on counter Sit to stand from elevated surface (24") 1 x 10 reps with holding (2) 3# dumbbells in hands HR 74 and SPO2 98% at end of session and denied dizziness   Patient response to treatment: patient  demonstrated improved technique with exercises with minimal VC for correct alignment. She required multiple rest periods throughout session and to rest room during session.  Improved motor control with repetition and cuing         PT Education - 08/24/16 1137    Education provided Yes   Education Details exercise instruction for technique and proper alignemnt   Person(s) Educated Patient   Methods Explanation;Demonstration;Verbal cues   Comprehension Verbalized understanding;Returned demonstration;Verbal cues required             PT Long Term Goals - 08/16/16 1025      PT LONG TERM GOAL #1   Title Patient will report decreased pain level to <2/10 with prolonged sitting >1 hour indicating improved function by 08/08/2016   Baseline sitting <1 hour with increased pain in back >5/10   Status Partially Met     PT LONG TERM GOAL #2   Title Patient will demonstrate improved self perceived disability due to back symptoms with improved MODI of 25% or less by 09/27/2016   Baseline MODI 52%; MODI 40%   Status Revised     PT LONG TERM GOAL #3   Title Patient will demonstrate improved 10MW to 22ms or better by 09/27/2016 to demonstrate improved community ambulation   Baseline 10MW 14.5 seconds (.69 m/s)   Status New     PT LONG TERM GOAL #4   Title Patient will demonstrate improved TUG to 13 seconds or better by 09/27/2016 Demonstrating decreased fall risk   Baseline TUG 16.3 seconds   Status New     PT LONG TERM GOAL #5   Title Patient will be independent with home program for flexiblity, strength and balance exercises by 09/27/2016 to allow discharge from physical therapy with self management   Baseline patient requires assistance, VC to perform exercises with correct technique/alignment   Status Revised               Plan - 08/24/16 1139    Clinical Impression Statement patient with more fatigue today most likely due to doing more at home. Arrived 15 min. late, limiting  exercise time today. she did well with good technqiue and alignment with minimal cuing and should continue to improve with additional physical therapy intervention.    Rehab Potential Good   Clinical Impairments Affecting Rehab Potential (+) motivated(-) chronic condition, obesity, osteoarthritis, rheumatoid arthritis   PT Frequency 2x / week   PT Duration 6 weeks   PT Treatment/Interventions Electrical Stimulation;Moist Heat;Patient/family education;Neuromuscular re-education;Therapeutic exercise;Manual techniques   PT Next Visit Plan progressive strengthening, endurance and balance exercises   PT Home Exercise Plan stabilization in sitting, standing, walking exercises/activities  Patient will benefit from skilled therapeutic intervention in order to improve the following deficits and impairments:  Decreased strength, Pain, Impaired perceived functional ability, Decreased activity tolerance, Decreased endurance, Difficulty walking  Visit Diagnosis: Muscle weakness (generalized)  Difficulty in walking, not elsewhere classified  Chronic midline low back pain without sciatica  Pain in left leg     Problem List Patient Active Problem List   Diagnosis Date Noted  . Encounter for screening colonoscopy 06/23/2016  . Hypertension 04/26/2015  . Dry mouth 04/26/2015  . Bell palsy 09/04/2014  . Obesity 09/04/2014  . Allergic rhinitis 08/04/2014  . Anxiety 08/04/2014  . Cannot sleep 08/04/2014  . Neuropathic pain 08/04/2014  . Atrial fibrillation ([place]) 08/04/2014  . Avitaminosis D 08/04/2014  . Gastroesophageal reflux disease without esophagitis 11/07/2013  . Polypharmacy 08/19/2013  . Osteoarthritis 06/11/2013  . Rheumatoid arthritis ([place]) 06/11/2013  . Hypercholesteremia 05/12/2009  . Benign essential tremor 05/12/2009  . Elevated blood sugar 08/26/2008  . Adult hypothyroidism 08/26/2008  . Menopausal symptom 08/26/2008    Jomarie Longs PT 08/24/2016, 12:20 PM  [place] [facility] [address] [place], [address] Phone: [phone]   Fax:  [phone]  Name: Glenda Williams MRN: [id number] Date of Birth: [birth year]

## 2016-08-28 ENCOUNTER — Encounter: Payer: Medicare Other | Admitting: Physical Therapy

## 2016-08-28 ENCOUNTER — Ambulatory Visit
Admission: RE | Admit: 2016-08-28 | Discharge: 2016-08-28 | Disposition: A | Discharge status: Home / Self Care | Payer: Medicare Other | Source: Ambulatory Visit | Attending: Physician Assistant | Admitting: Physician Assistant

## 2016-08-28 DIAGNOSIS — Z1231 Encounter for screening mammogram for malignant neoplasm of breast: Secondary | ICD-10-CM | POA: Diagnosis not present

## 2016-08-30 ENCOUNTER — Encounter: Payer: Medicare Other | Admitting: Physical Therapy

## 2016-08-31 ENCOUNTER — Ambulatory Visit: Payer: Medicare Other | Admitting: Physical Therapy

## 2016-08-31 ENCOUNTER — Encounter: Payer: Self-pay | Admitting: Physical Therapy

## 2016-08-31 DIAGNOSIS — R262 Difficulty in walking, not elsewhere classified: Secondary | ICD-10-CM

## 2016-08-31 DIAGNOSIS — M545 Low back pain: Secondary | ICD-10-CM

## 2016-08-31 DIAGNOSIS — M6281 Muscle weakness (generalized): Secondary | ICD-10-CM

## 2016-08-31 DIAGNOSIS — G8929 Other chronic pain: Secondary | ICD-10-CM

## 2016-08-31 DIAGNOSIS — M79605 Pain in left leg: Secondary | ICD-10-CM

## 2016-08-31 NOTE — Therapy (Signed)
[place] [facility] [address] [place], [address] Phone: [phone]   Fax:  [phone]  Physical Therapy Treatment  Patient Details  Name: Glenda Williams MRN: [id number] Date of Birth: [birth year] Referring Provider: Leeanne Mannan., Eugene Gavia MD  Encounter Date: 08/31/2016      PT End of Session - 08/31/16 1314    Visit Number 13   Number of Visits 24   Date for PT Re-Evaluation 09/27/16   Authorization Type 13   Authorization Time Period 10 (G code)   PT Start Time 1128   PT Stop Time 1200   PT Time Calculation (min) 32 min   Activity Tolerance Patient tolerated treatment well   Behavior During Therapy [facility] for tasks assessed/performed      Past Medical History:  Diagnosis Date  . A-fib ([place])   . Arthritis   . GERD (gastroesophageal reflux disease)   . Heart disease   . Rheumatoid arthritis ([place])   . Thyroid disease   . Tremors of nervous system     Past Surgical History:  Procedure Laterality Date  . BREAST BIOPSY Left 1988   Benign  . BREAST BIOPSY Left 1987   Benign  . BROW LIFT Bilateral 05/23/2016   Procedure: BLEPHAROPLASTY upper eyelid; w/excess skin;  Surgeon: Karle Starch, MD;  Location: [place];  Service: Ophthalmology;  Laterality: Bilateral;  . CARPAL TUNNEL RELEASE Bilateral   . CHOLECYSTECTOMY     Dr. Bary Castilla  . COLONOSCOPY  2006  . JOINT REPLACEMENT Bilateral    knees  . REPLACEMENT TOTAL KNEE Left 2005  . REPLACEMENT TOTAL KNEE Right 1998    There were no vitals filed for this visit.      Subjective Assessment - 08/31/16 1129    Subjective Paient reports she is doing better today.    Pertinent History Patient reports she has had chronic back pain with arthritis and disc pain for years. She has had X rays. She reports she had a fall at home and tripped and fell forward into computer table with left sided bruing and pain left arm, ribs. she went to MD and had  X rays of left arm, and knees with no fractures reported. She reports she had back pain which was due to UTI in March  2018 and she has been sitting in a chair to sleep. She has only been sleeping in her bed for the past 3 days. Last week She noticed itching in her back, she had hives/rash from macrobid. She is currently is improving with rash.    Limitations Walking;House hold activities;Other (comment)  rising from sitting   How long can you sit comfortably? <1 hour   How long can you stand comfortably? no more than 1 hour   How long can you walk comfortably? < 1 hour   Diagnostic tests X rays back, LE's   Patient Stated Goals to be able to walk without difficulty, feeling unbalanced   Currently in Pain? No/denies       Objective: Gait; SPC, good cadence, mild limp to left with right pelvis drop   Treatment:  Therapeutic exercise: patient performed with VC, demonstration and guidance of therapist: goal: independent with home program, strength Sitting: both LE's Knee extension 3# on ankles 1 x 20 Knee flexion green resistive band 1 x 20 Hip abduction (clam) with green resistive band x 20 Step ups onto balance stones 10 reps leading with each LE with UE  support on counter  Staggered balance stone walk with holding 3# weight in one hand, 3 sets  Weight shifting forward and back on staggered balance stones 10 reps each stance (right and left foot in rear position) Sit to stand from elevated surface (24") 1 x 10 reps with holding (2) 3# dumbbells in hands  Patient response to treatment: Patient demonstrated improved balance and technique with exercises and walking on balance stones with repetition and minimal verbal cuing.       PT Education - 08/31/16 1129    Education Details instruction with technique and proper alignment   Person(s) Educated Patient   Methods Explanation;Demonstration;Verbal cues   Comprehension Verbalized understanding;Returned demonstration;Verbal cues  required             PT Long Term Goals - 08/16/16 1025      PT LONG TERM GOAL #1   Title Patient will report decreased pain level to <2/10 with prolonged sitting >1 hour indicating improved function by 08/08/2016   Baseline sitting <1 hour with increased pain in back >5/10   Status Partially Met     PT LONG TERM GOAL #2   Title Patient will demonstrate improved self perceived disability due to back symptoms with improved MODI of 25% or less by 09/27/2016   Baseline MODI 52%; MODI 40%   Status Revised     PT LONG TERM GOAL #3   Title Patient will demonstrate improved 10MW to 34ms or better by 09/27/2016 to demonstrate improved community ambulation   Baseline 10MW 14.5 seconds (.69 m/s)   Status New     PT LONG TERM GOAL #4   Title Patient will demonstrate improved TUG to 13 seconds or better by 09/27/2016 Demonstrating decreased fall risk   Baseline TUG 16.3 seconds   Status New     PT LONG TERM GOAL #5   Title Patient will be independent with home program for flexiblity, strength and balance exercises by 09/27/2016 to allow discharge from physical therapy with self management   Baseline patient requires assistance, VC to perform exercises with correct technique/alignment   Status Revised               Plan - 08/31/16 1315    Clinical Impression Statement Patient demonstrated improvement with exercises and balance exercises with repetition. Patient demonstrates steady progress towards goals with improvement noted in  gait pattern demonstrated with patient reporting improved strength. Patient will benefit from continued physical therapy intervention to address limitations and achieve goals.    Rehab Potential Good   Clinical Impairments Affecting Rehab Potential (+) motivated(-) chronic condition, obesity, osteoarthritis, rheumatoid arthritis   PT Frequency 2x / week   PT Duration 6 weeks   PT Treatment/Interventions Electrical Stimulation;Moist Heat;Patient/family  education;Neuromuscular re-education;Therapeutic exercise;Manual techniques   PT Next Visit Plan progressive strengthening, endurance and balance exercises   PT Home Exercise Plan stabilization in sitting, standing, walking exercises/activities      Patient will benefit from skilled therapeutic intervention in order to improve the following deficits and impairments:  Decreased strength, Pain, Impaired perceived functional ability, Decreased activity tolerance, Decreased endurance, Difficulty walking  Visit Diagnosis: Muscle weakness (generalized)  Difficulty in walking, not elsewhere classified  Chronic midline low back pain without sciatica  Pain in left leg     Problem List Patient Active Problem List   Diagnosis Date Noted  . Encounter for screening colonoscopy 06/23/2016  . Hypertension 04/26/2015  . Dry mouth 04/26/2015  . Bell palsy 09/04/2014  . Obesity  09/04/2014  . Allergic rhinitis 08/04/2014  . Anxiety 08/04/2014  . Cannot sleep 08/04/2014  . Neuropathic pain 08/04/2014  . Atrial fibrillation ([place]) 08/04/2014  . Avitaminosis D 08/04/2014  . Gastroesophageal reflux disease without esophagitis 11/07/2013  . Polypharmacy 08/19/2013  . Osteoarthritis 06/11/2013  . Rheumatoid arthritis ([place]) 06/11/2013  . Hypercholesteremia 05/12/2009  . Benign essential tremor 05/12/2009  . Elevated blood sugar 08/26/2008  . Adult hypothyroidism 08/26/2008  . Menopausal symptom 08/26/2008    Jomarie Longs PT 08/31/2016, 1:16 PM  [place] [facility] [address] [place], [address] Phone: [phone]   Fax:  [phone]  Name: SHALAH ESTELLE MRN: [id number] Date of Birth: May 16, 1940

## 2016-09-01 ENCOUNTER — Telehealth: Payer: Self-pay | Admitting: Physician Assistant

## 2016-09-01 ENCOUNTER — Telehealth: Payer: Self-pay | Admitting: Radiology

## 2016-09-01 NOTE — Telephone Encounter (Signed)
Please review-aa 

## 2016-09-01 NOTE — Telephone Encounter (Signed)
Pt advised.   Thanks,   -Laticha Ferrucci  

## 2016-09-01 NOTE — Telephone Encounter (Signed)
She has a prescription for this.  Why does she want samples?  Cost?  If cost, then yes.    Hollice Espy, MD

## 2016-09-01 NOTE — Telephone Encounter (Signed)
Pt called wanting to know if we have any rivaroxaban (XARELTO) 20 MG TABS tablet samples in stock  Pt's call back is [phone]  Thanks teri

## 2016-09-01 NOTE — Telephone Encounter (Signed)
Left one month supply of 20 mg xarelto upfront. One pill daily as prescribed by her cardiologist. Thanks.

## 2016-09-01 NOTE — Telephone Encounter (Signed)
Pt requests Myrbetriq 25mg  samples. Please advise. Pt may be reached at [phone].

## 2016-09-04 NOTE — Telephone Encounter (Signed)
Spoke to patient. She stated she is not able to afford Myrbetriq but it works for her. Advised will place samples up front.

## 2016-09-05 ENCOUNTER — Encounter: Payer: Self-pay | Admitting: Physical Therapy

## 2016-09-05 ENCOUNTER — Ambulatory Visit: Payer: Medicare Other | Attending: Rheumatology | Admitting: Physical Therapy

## 2016-09-05 DIAGNOSIS — M545 Low back pain, unspecified: Secondary | ICD-10-CM

## 2016-09-05 DIAGNOSIS — G8929 Other chronic pain: Secondary | ICD-10-CM

## 2016-09-05 DIAGNOSIS — M79605 Pain in left leg: Secondary | ICD-10-CM | POA: Diagnosis not present

## 2016-09-05 DIAGNOSIS — R262 Difficulty in walking, not elsewhere classified: Secondary | ICD-10-CM | POA: Diagnosis not present

## 2016-09-05 DIAGNOSIS — M6281 Muscle weakness (generalized): Secondary | ICD-10-CM | POA: Insufficient documentation

## 2016-09-05 NOTE — Therapy (Signed)
[facility] [address] [place], [address] Phone: [phone]   Fax:  [phone]  Physical Therapy Treatment  Patient Details  Name: Glenda Williams MRN: [id number] Date of Birth: 09-Nov-1940 Referring Provider: Leeanne Mannan., Eugene Gavia MD  Encounter Date: 09/05/2016      PT End of Session - 09/05/16 1128    Visit Number 14   Number of Visits 24   Date for PT Re-Evaluation 09/27/16   Authorization Type 14   Authorization Time Period 20 (G code)   PT Start Time 1125   PT Stop Time 1205   PT Time Calculation (min) 40 min   Activity Tolerance Patient tolerated treatment well   Behavior During Therapy [facility] for tasks assessed/performed      Past Medical History:  Diagnosis Date  . A-fib ([place])   . Arthritis   . GERD (gastroesophageal reflux disease)   . Heart disease   . Rheumatoid arthritis ([place])   . Thyroid disease   . Tremors of nervous system     Past Surgical History:  Procedure Laterality Date  . BREAST BIOPSY Left 1988   Benign  . BREAST BIOPSY Left 1987   Benign  . BROW LIFT Bilateral 05/23/2016   Procedure: BLEPHAROPLASTY upper eyelid; w/excess skin;  Surgeon: Karle Starch, MD;  Location: [place];  Service: Ophthalmology;  Laterality: Bilateral;  . CARPAL TUNNEL RELEASE Bilateral   . CHOLECYSTECTOMY     Dr. Bary Castilla  . COLONOSCOPY  2006  . JOINT REPLACEMENT Bilateral    knees  . REPLACEMENT TOTAL KNEE Left 2005  . REPLACEMENT TOTAL KNEE Right 1998    There were no vitals filed for this visit.      Subjective Assessment - 09/05/16 1126    Subjective Patient reports she is having increased soreness in her lower back today. She has been more active in the past few days.    Pertinent History Patient reports she has had chronic back pain with arthritis and disc pain for years. She has had X rays. She reports she had a fall at home and tripped and fell forward into  computer table with left sided bruing and pain left arm, ribs. she went to MD and had X rays of left arm, and knees with no fractures reported. She reports she had back pain which was due to UTI in March  2018 and she has been sitting in a chair to sleep. She has only been sleeping in her bed for the past 3 days. Last week She noticed itching in her back, she had hives/rash from macrobid. She is currently is improving with rash.    Limitations Walking;House hold activities;Other (comment)  rising from sitting   How long can you sit comfortably? <1 hour   How long can you stand comfortably? no more than 1 hour   How long can you walk comfortably? < 1 hour   Diagnostic tests X rays back, LE's   Patient Stated Goals to be able to walk without difficulty, feeling unbalanced   Currently in Pain? Yes   Pain Score 3    Pain Location Back   Pain Orientation Right;Left;Upper;Lower   Pain Descriptors / Indicators Aching   Pain Type Chronic pain   Pain Onset More than a month ago   Pain Frequency Intermittent        Objective: Gait; SPC, good cadence, mild limp to left with right pelvis drop   Treatment:  Therapeutic exercise:patient performed with VC, demonstration and guidance of therapist: goal: independent with home program, strength Sitting: both LE's (moist heat applied to lower back during sitting exercises x 15 min. For pain control, no adverse reactions noted) Knee extension 3# on ankles 2 x 15 Knee flexion green resistive band 1 x 20 Hip abduction (clam) with green resistive band x 20, then single leg x 15 reps each LE Standing:  Step ups onto balance beam x 10 reps leading with each LE with UE support on counter  balance beam lateral step ups with hip flexion each LE x 10 reps balance beam side stepping x 2 min. Sit to stand from elevated surface (24") 3 x 5 reps with holding (2) 4# dumbbells in hands  Patient response to treatment: Patient demonstrated improvement with ability  to perform exercises with minimal VC and assistance. She reported decreased soreness to mild in lower back at end of session.              PT Education - 09/05/16 1150    Education provided Yes   Education Details instruction for proper positioning, alignment and technique   Person(s) Educated Patient   Methods Explanation;Demonstration;Verbal cues   Comprehension Verbalized understanding;Returned demonstration;Verbal cues required             PT Long Term Goals - 08/16/16 1025      PT LONG TERM GOAL #1   Title Patient will report decreased pain level to <2/10 with prolonged sitting >1 hour indicating improved function by 08/08/2016   Baseline sitting <1 hour with increased pain in back >5/10   Status Partially Met     PT LONG TERM GOAL #2   Title Patient will demonstrate improved self perceived disability due to back symptoms with improved MODI of 25% or less by 09/27/2016   Baseline MODI 52%; MODI 40%   Status Revised     PT LONG TERM GOAL #3   Title Patient will demonstrate improved 10MW to 73ms or better by 09/27/2016 to demonstrate improved community ambulation   Baseline 10MW 14.5 seconds (.69 m/s)   Status New     PT LONG TERM GOAL #4   Title Patient will demonstrate improved TUG to 13 seconds or better by 09/27/2016 Demonstrating decreased fall risk   Baseline TUG 16.3 seconds   Status New     PT LONG TERM GOAL #5   Title Patient will be independent with home program for flexiblity, strength and balance exercises by 09/27/2016 to allow discharge from physical therapy with self management   Baseline patient requires assistance, VC to perform exercises with correct technique/alignment   Status Revised               Plan - 09/05/16 1129    Clinical Impression Statement Patient demonstrates steady progress towards goals with improvement noted in strength, endurance.Improved gait pattern demonstrated with decreased limp and improved level pelvis with stance  on left LE. Patient will benefit from continued physical therapy intervention to address limitations and achieve goals..   Rehab Potential Good   Clinical Impairments Affecting Rehab Potential (+) motivated(-) chronic condition, obesity, osteoarthritis, rheumatoid arthritis   PT Frequency 2x / week   PT Duration 6 weeks   PT Treatment/Interventions Electrical Stimulation;Moist Heat;Patient/family education;Neuromuscular re-education;Therapeutic exercise;Manual techniques   PT Next Visit Plan progressive strengthening, endurance and balance exercises   PT Home Exercise Plan stabilization in sitting, standing, walking exercises/activities      Patient will benefit from skilled therapeutic  intervention in order to improve the following deficits and impairments:  Decreased strength, Pain, Impaired perceived functional ability, Decreased activity tolerance, Decreased endurance, Difficulty walking  Visit Diagnosis: Muscle weakness (generalized)  Difficulty in walking, not elsewhere classified  Chronic midline low back pain without sciatica  Pain in left leg     Problem List Patient Active Problem List   Diagnosis Date Noted  . Encounter for screening colonoscopy 06/23/2016  . Hypertension 04/26/2015  . Dry mouth 04/26/2015  . Bell palsy 09/04/2014  . Obesity 09/04/2014  . Allergic rhinitis 08/04/2014  . Anxiety 08/04/2014  . Cannot sleep 08/04/2014  . Neuropathic pain 08/04/2014  . Atrial fibrillation ([place]) 08/04/2014  . Avitaminosis D 08/04/2014  . Gastroesophageal reflux disease without esophagitis 11/07/2013  . Polypharmacy 08/19/2013  . Osteoarthritis 06/11/2013  . Rheumatoid arthritis ([place]) 06/11/2013  . Hypercholesteremia 05/12/2009  . Benign essential tremor 05/12/2009  . Elevated blood sugar 08/26/2008  . Adult hypothyroidism 08/26/2008  . Menopausal symptom 08/26/2008    Jomarie Longs PT 09/06/2016, 4:05 PM  [place] [facility] [address] [place], [address] Phone: [phone]   Fax:  [phone]  Name: Glenda Williams MRN: [id number] Date of Birth: [birth year]

## 2016-09-12 ENCOUNTER — Encounter: Payer: Self-pay | Admitting: Physical Therapy

## 2016-09-12 ENCOUNTER — Ambulatory Visit: Payer: Medicare Other | Admitting: Physical Therapy

## 2016-09-12 DIAGNOSIS — R262 Difficulty in walking, not elsewhere classified: Secondary | ICD-10-CM

## 2016-09-12 DIAGNOSIS — M545 Low back pain: Secondary | ICD-10-CM | POA: Diagnosis not present

## 2016-09-12 DIAGNOSIS — M6281 Muscle weakness (generalized): Secondary | ICD-10-CM | POA: Diagnosis not present

## 2016-09-12 DIAGNOSIS — M79605 Pain in left leg: Secondary | ICD-10-CM

## 2016-09-12 DIAGNOSIS — G8929 Other chronic pain: Secondary | ICD-10-CM

## 2016-09-12 NOTE — Therapy (Signed)
[place] [facility] [address] [place], [address] Phone: [phone]   Fax:  [phone]  Physical Therapy Treatment  Patient Details  Name: Glenda Williams MRN: [id number] Date of Birth: 06-24-1940 Referring Provider: Leeanne Mannan., Eugene Gavia MD  Encounter Date: 09/12/2016      PT End of Session - 09/12/16 1146    Visit Number 15   Number of Visits 24   Date for PT Re-Evaluation 09/27/16   Authorization Type 15   Authorization Time Period 10 (G code)   PT Start Time 1137   PT Stop Time 1220   PT Time Calculation (min) 43 min   Activity Tolerance Patient tolerated treatment well;Patient limited by pain   Behavior During Therapy [facility] for tasks assessed/performed      Past Medical History:  Diagnosis Date  . A-fib ([place])   . Arthritis   . GERD (gastroesophageal reflux disease)   . Heart disease   . Rheumatoid arthritis ([place])   . Thyroid disease   . Tremors of nervous system     Past Surgical History:  Procedure Laterality Date  . BREAST BIOPSY Left 1988   Benign  . BREAST BIOPSY Left 1987   Benign  . BROW LIFT Bilateral 05/23/2016   Procedure: BLEPHAROPLASTY upper eyelid; w/excess skin;  Surgeon: Karle Starch, MD;  Location: [place];  Service: Ophthalmology;  Laterality: Bilateral;  . CARPAL TUNNEL RELEASE Bilateral   . CHOLECYSTECTOMY     Dr. Bary Castilla  . COLONOSCOPY  2006  . JOINT REPLACEMENT Bilateral    knees  . REPLACEMENT TOTAL KNEE Left 2005  . REPLACEMENT TOTAL KNEE Right 1998    There were no vitals filed for this visit.      Subjective Assessment - 09/12/16 1141    Subjective Patient reports "feeling sore" today due to increased activity yesterday with cleaning, vaccuming and washing windows, vinyl. Patient arriving late 15 min. to therapy today.    Pertinent History Patient reports she has had chronic back pain with arthritis and disc pain for years. She has had X  rays. She reports she had a fall at home and tripped and fell forward into computer table with left sided bruing and pain left arm, ribs. she went to MD and had X rays of left arm, and knees with no fractures reported. She reports she had back pain which was due to UTI in March  2018 and she has been sitting in a chair to sleep. She has only been sleeping in her bed for the past 3 days. Last week She noticed itching in her back, she had hives/rash from macrobid. She is currently is improving with rash.    Limitations Walking;House hold activities;Other (comment)  rising from sitting   How long can you sit comfortably? <1 hour   How long can you stand comfortably? no more than 1 hour   How long can you walk comfortably? < 1 hour   Diagnostic tests X rays back, LE's   Patient Stated Goals to be able to walk without difficulty, feeling unbalanced   Currently in Pain? Yes   Pain Score 5    Pain Location Back   Pain Orientation Right;Left;Upper;Lower;Mid   Pain Descriptors / Indicators Aching   Pain Type Chronic pain   Pain Onset More than a month ago   Pain Frequency Intermittent      Objective: Gait; SPC, slow cadence, mild limp to left with right pelvis  drop   Treatment:  Therapeutic exercise:patient performed with VC, demonstration and guidance of therapist: goal: independent with home program, strength Sitting: both LE's (moist heat applied to lower back prior to exercises x 15 min. For pain control, no adverse reactions noted) Knee flexion green resistive band 1x 20 Hip abduction (clam) with green resistive band x 20 Standing:  Step ups onto balance beam x 10 reps leading with each LE with UE support on counter  balance beam side stepping x 2 min. Step up laterally onto balance stone with focus on not dropping pelvis, VC and repeated demonstration to improve technique  Patient response to treatment: Patient with improved motor control with hip exercises in standing with repetition  and repeated cuing, demonstration. Decreased pain in back with moist heat and decreased overall pain/soreness to <3/10 at end of session.            PT Education - 09/12/16 1145    Education provided Yes   Education Details instruction with demonstration for proper technique and alignment   Person(s) Educated Patient   Methods Explanation   Comprehension Verbalized understanding             PT Long Term Goals - 08/16/16 1025      PT LONG TERM GOAL #1   Title Patient will report decreased pain level to <2/10 with prolonged sitting >1 hour indicating improved function by 08/08/2016   Baseline sitting <1 hour with increased pain in back >5/10   Status Partially Met     PT LONG TERM GOAL #2   Title Patient will demonstrate improved self perceived disability due to back symptoms with improved MODI of 25% or less by 09/27/2016   Baseline MODI 52%; MODI 40%   Status Revised     PT LONG TERM GOAL #3   Title Patient will demonstrate improved 10MW to 77ms or better by 09/27/2016 to demonstrate improved community ambulation   Baseline 10MW 14.5 seconds (.69 m/s)   Status New     PT LONG TERM GOAL #4   Title Patient will demonstrate improved TUG to 13 seconds or better by 09/27/2016 Demonstrating decreased fall risk   Baseline TUG 16.3 seconds   Status New     PT LONG TERM GOAL #5   Title Patient will be independent with home program for flexiblity, strength and balance exercises by 09/27/2016 to allow discharge from physical therapy with self management   Baseline patient requires assistance, VC to perform exercises with correct technique/alignment   Status Revised               Plan - 09/12/16 1151    Clinical Impression Statement Patient demonstrates steady progress towards goals with improvement noted in motor control, strength, endurance. She continues with significant weakness in left hip abduction and extension and will benefit from continued physical therapy  intervention to address limitations and achieve goals.    Rehab Potential Good   Clinical Impairments Affecting Rehab Potential (+) motivated(-) chronic condition, obesity, osteoarthritis, rheumatoid arthritis   PT Frequency 2x / week   PT Duration 6 weeks   PT Treatment/Interventions Electrical Stimulation;Moist Heat;Patient/family education;Neuromuscular re-education;Therapeutic exercise;Manual techniques   PT Next Visit Plan progressive strengthening, endurance and balance exercises   PT Home Exercise Plan stabilization in sitting, standing, walking exercises/activities      Patient will benefit from skilled therapeutic intervention in order to improve the following deficits and impairments:  Decreased strength, Pain, Impaired perceived functional ability, Decreased activity tolerance, Decreased endurance,  Difficulty walking  Visit Diagnosis: Muscle weakness (generalized)  Difficulty in walking, not elsewhere classified  Chronic midline low back pain without sciatica  Pain in left leg     Problem List Patient Active Problem List   Diagnosis Date Noted  . Encounter for screening colonoscopy 06/23/2016  . Hypertension 04/26/2015  . Dry mouth 04/26/2015  . Bell palsy 09/04/2014  . Obesity 09/04/2014  . Allergic rhinitis 08/04/2014  . Anxiety 08/04/2014  . Cannot sleep 08/04/2014  . Neuropathic pain 08/04/2014  . Atrial fibrillation ([place]) 08/04/2014  . Avitaminosis D 08/04/2014  . Gastroesophageal reflux disease without esophagitis 11/07/2013  . Polypharmacy 08/19/2013  . Osteoarthritis 06/11/2013  . Rheumatoid arthritis ([place]) 06/11/2013  . Hypercholesteremia 05/12/2009  . Benign essential tremor 05/12/2009  . Elevated blood sugar 08/26/2008  . Adult hypothyroidism 08/26/2008  . Menopausal symptom 08/26/2008    Jomarie Longs PT 09/12/2016, 12:37 PM  [place] [facility] [address] [place],  [address] Phone: [phone]   Fax:  [phone]  Name: WAJIHA VERSTEEG MRN: [id number] Date of Birth: 05-26-1940

## 2016-09-14 ENCOUNTER — Ambulatory Visit: Payer: Medicare Other | Admitting: Physical Therapy

## 2016-09-19 ENCOUNTER — Ambulatory Visit: Payer: Medicare Other | Admitting: Physical Therapy

## 2016-09-19 ENCOUNTER — Encounter: Payer: Self-pay | Admitting: Physical Therapy

## 2016-09-19 DIAGNOSIS — M6281 Muscle weakness (generalized): Secondary | ICD-10-CM

## 2016-09-19 DIAGNOSIS — M545 Low back pain, unspecified: Secondary | ICD-10-CM

## 2016-09-19 DIAGNOSIS — G8929 Other chronic pain: Secondary | ICD-10-CM

## 2016-09-19 DIAGNOSIS — R262 Difficulty in walking, not elsewhere classified: Secondary | ICD-10-CM | POA: Diagnosis not present

## 2016-09-19 DIAGNOSIS — M79605 Pain in left leg: Secondary | ICD-10-CM

## 2016-09-19 NOTE — Therapy (Signed)
[place] [facility] [address] [place], [address] Phone: [phone]   Fax:  [phone]  Physical Therapy Treatment  Patient Details  Name: Glenda Williams MRN: [id number] Date of Birth: Feb 26, 1941 Referring Provider: Leeanne Mannan., Eugene Gavia MD  Encounter Date: 09/19/2016      PT End of Session - 09/19/16 1129    Visit Number 16   Number of Visits 24   Date for PT Re-Evaluation 09/27/16   Authorization Type 16   Authorization Time Period 10 (G code)   PT Start Time 1124   PT Stop Time 1207   PT Time Calculation (min) 43 min   Activity Tolerance Patient tolerated treatment well;Patient limited by pain   Behavior During Therapy [facility] for tasks assessed/performed      Past Medical History:  Diagnosis Date  . A-fib ([place])   . Arthritis   . GERD (gastroesophageal reflux disease)   . Heart disease   . Rheumatoid arthritis ([place])   . Thyroid disease   . Tremors of nervous system     Past Surgical History:  Procedure Laterality Date  . BREAST BIOPSY Left 1988   Benign  . BREAST BIOPSY Left 1987   Benign  . BROW LIFT Bilateral 05/23/2016   Procedure: BLEPHAROPLASTY upper eyelid; w/excess skin;  Surgeon: Karle Starch, MD;  Location: [place];  Service: Ophthalmology;  Laterality: Bilateral;  . CARPAL TUNNEL RELEASE Bilateral   . CHOLECYSTECTOMY     Dr. Bary Castilla  . COLONOSCOPY  2006  . JOINT REPLACEMENT Bilateral    knees  . REPLACEMENT TOTAL KNEE Left 2005  . REPLACEMENT TOTAL KNEE Right 1998    There were no vitals filed for this visit.      Subjective Assessment - 09/19/16 1127    Subjective Patient is reporting burning in neck and across upper back today and her hips are hurting today. She reports she is feeling stronger overall with current treatment.    Pertinent History Patient reports she has had chronic back pain with arthritis and disc pain for years. She has had X rays. She  reports she had a fall at home and tripped and fell forward into computer table with left sided bruing and pain left arm, ribs. she went to MD and had X rays of left arm, and knees with no fractures reported. She reports she had back pain which was due to UTI in March  2018 and she has been sitting in a chair to sleep. She has only been sleeping in her bed for the past 3 days. Last week She noticed itching in her back, she had hives/rash from macrobid. She is currently is improving with rash.    Limitations Walking;House hold activities;Other (comment)  rising from sitting   How long can you sit comfortably? <1 hour   How long can you stand comfortably? no more than 1 hour   How long can you walk comfortably? < 1 hour   Diagnostic tests X rays back, LE's   Patient Stated Goals to be able to walk without difficulty, feeling unbalanced   Currently in Pain? Yes   Pain Score 3   5/10 upper back, neck area   Pain Location Back   Pain Orientation Right;Left;Lower;Upper   Pain Descriptors / Indicators Aching   Pain Type Chronic pain   Pain Onset More than a month ago   Pain Frequency Intermittent  Objective: Gait; SPC, slow cadence, mild limp to left with right pelvis drop   Treatment:  Therapeutic exercise:patient performed with VC, demonstration and guidance of therapist: goal: independent with home program, strength Sitting: both LE's  Knee flexion green resistive band 1x 20 Hip abduction (clam) with graded manual resistance x 20 reps Standing:  Step ups onto balance beam x 10 reps leading with each LE with UE support on counter  balance beam side stepping x 2 min. Step up laterally onto balance stone with focus on not dropping pelvis, VC and repeated demonstration to improve technique Standing diagonal walk with 3# in left hand and using SPC for balance Hip abduction with weight shift to each side with good alignment and control of hip musculature 5x each LE, slow,  controlled motion  Patient response to treatment: Patient required repeated demonstration and VC to perform exercises with proper technique and alignment. Her motor control improved with repetition and slow controlled motion. No increased pain reported throughout session, mild to moderate fatigue reported at end of session.          PT Education - 09/19/16 1145    Education provided Yes   Education Details instruction for proper position and alignment, motor control for standing exercises, keepin pelvis level   Person(s) Educated Patient   Methods Explanation;Demonstration;Verbal cues;Handout   Comprehension Verbalized understanding;Returned demonstration;Verbal cues required             PT Long Term Goals - 08/16/16 1025      PT LONG TERM GOAL #1   Title Patient will report decreased pain level to <2/10 with prolonged sitting >1 hour indicating improved function by 08/08/2016   Baseline sitting <1 hour with increased pain in back >5/10   Status Partially Met     PT LONG TERM GOAL #2   Title Patient will demonstrate improved self perceived disability due to back symptoms with improved MODI of 25% or less by 09/27/2016   Baseline MODI 52%; MODI 40%   Status Revised     PT LONG TERM GOAL #3   Title Patient will demonstrate improved 10MW to 68ms or better by 09/27/2016 to demonstrate improved community ambulation   Baseline 10MW 14.5 seconds (.69 m/s)   Status New     PT LONG TERM GOAL #4   Title Patient will demonstrate improved TUG to 13 seconds or better by 09/27/2016 Demonstrating decreased fall risk   Baseline TUG 16.3 seconds   Status New     PT LONG TERM GOAL #5   Title Patient will be independent with home program for flexiblity, strength and balance exercises by 09/27/2016 to allow discharge from physical therapy with self management   Baseline patient requires assistance, VC to perform exercises with correct technique/alignment   Status Revised                Plan - 09/19/16 1130    Clinical Impression Statement Patient continues to improve strength, pain is more stable and she is having a flare up of upper back and neck pain (RA) today. She continues with decreased left LE hip strength and is benefiting from physical therapy intervention.    Rehab Potential Good   Clinical Impairments Affecting Rehab Potential (+) motivated(-) chronic condition, obesity, osteoarthritis, rheumatoid arthritis   PT Frequency 2x / week   PT Duration 6 weeks   PT Treatment/Interventions Electrical Stimulation;Moist Heat;Patient/family education;Neuromuscular re-education;Therapeutic exercise;Manual techniques   PT Next Visit Plan progressive strengthening, endurance and balance exercises  PT Home Exercise Plan stabilization in sitting, standing, walking exercises/activities      Patient will benefit from skilled therapeutic intervention in order to improve the following deficits and impairments:  Decreased strength, Pain, Impaired perceived functional ability, Decreased activity tolerance, Decreased endurance, Difficulty walking  Visit Diagnosis: Muscle weakness (generalized)  Difficulty in walking, not elsewhere classified  Chronic midline low back pain without sciatica  Pain in left leg     Problem List Patient Active Problem List   Diagnosis Date Noted  . Encounter for screening colonoscopy 06/23/2016  . Hypertension 04/26/2015  . Dry mouth 04/26/2015  . Bell palsy 09/04/2014  . Obesity 09/04/2014  . Allergic rhinitis 08/04/2014  . Anxiety 08/04/2014  . Cannot sleep 08/04/2014  . Neuropathic pain 08/04/2014  . Atrial fibrillation ([place]) 08/04/2014  . Avitaminosis D 08/04/2014  . Gastroesophageal reflux disease without esophagitis 11/07/2013  . Polypharmacy 08/19/2013  . Osteoarthritis 06/11/2013  . Rheumatoid arthritis ([place]) 06/11/2013  . Hypercholesteremia 05/12/2009  . Benign essential tremor 05/12/2009  . Elevated blood sugar  08/26/2008  . Adult hypothyroidism 08/26/2008  . Menopausal symptom 08/26/2008    Jomarie Longs PT 09/20/2016, 8:36 AM  [place] [facility] [address], [place], [address] Phone: [phone]   Fax:  [phone]  Name: Glenda Williams MRN: [id number] Date of Birth: Jul 02, 1940

## 2016-09-21 ENCOUNTER — Other Ambulatory Visit: Payer: Self-pay | Admitting: Family Medicine

## 2016-09-21 ENCOUNTER — Ambulatory Visit: Payer: Medicare Other | Admitting: Physical Therapy

## 2016-09-21 DIAGNOSIS — M545 Low back pain: Secondary | ICD-10-CM

## 2016-09-21 DIAGNOSIS — M79605 Pain in left leg: Secondary | ICD-10-CM | POA: Diagnosis not present

## 2016-09-21 DIAGNOSIS — G8929 Other chronic pain: Secondary | ICD-10-CM

## 2016-09-21 DIAGNOSIS — M6281 Muscle weakness (generalized): Secondary | ICD-10-CM | POA: Diagnosis not present

## 2016-09-21 DIAGNOSIS — R262 Difficulty in walking, not elsewhere classified: Secondary | ICD-10-CM | POA: Diagnosis not present

## 2016-09-21 DIAGNOSIS — F419 Anxiety disorder, unspecified: Secondary | ICD-10-CM

## 2016-09-21 NOTE — Therapy (Signed)
[facility] [address] [place], [address] Phone: [phone]   Fax:  [phone]  Physical Therapy Treatment  Patient Details  Name: Glenda Williams MRN: [id number] Date of Birth: Sep 15, 1940 Referring Provider: Leeanne Mannan., Eugene Gavia MD  Encounter Date: 09/21/2016      PT End of Session - 09/21/16 1139    Visit Number 17   Number of Visits 24   Date for PT Re-Evaluation 09/27/16   Authorization Type 17   Authorization Time Period 20 (G code)   PT Start Time 1137   PT Stop Time 1210   PT Time Calculation (min) 33 min   Activity Tolerance Patient tolerated treatment well;Patient limited by pain   Behavior During Therapy [facility] for tasks assessed/performed      Past Medical History:  Diagnosis Date  . A-fib ([place])   . Arthritis   . GERD (gastroesophageal reflux disease)   . Heart disease   . Rheumatoid arthritis ([place])   . Thyroid disease   . Tremors of nervous system     Past Surgical History:  Procedure Laterality Date  . BREAST BIOPSY Left 1988   Benign  . BREAST BIOPSY Left 1987   Benign  . BROW LIFT Bilateral 05/23/2016   Procedure: BLEPHAROPLASTY upper eyelid; w/excess skin;  Surgeon: Karle Starch, MD;  Location: [facility];  Service: Ophthalmology;  Laterality: Bilateral;  . CARPAL TUNNEL RELEASE Bilateral   . CHOLECYSTECTOMY     Dr. Bary Castilla  . COLONOSCOPY  2006  . JOINT REPLACEMENT Bilateral    knees  . REPLACEMENT TOTAL KNEE Left 2005  . REPLACEMENT TOTAL KNEE Right 1998    There were no vitals filed for this visit.      Subjective Assessment - 09/21/16 1137    Subjective Patient is reporting burning in neck and across upper and lower back today.   Pertinent History Patient reports she has had chronic back pain with arthritis and disc pain for years. She has had X rays. She reports she had a fall at home and tripped and fell forward into computer table with left  sided bruing and pain left arm, ribs. she went to MD and had X rays of left arm, and knees with no fractures reported. She reports she had back pain which was due to UTI in March  2018 and she has been sitting in a chair to sleep. She has only been sleeping in her bed for the past 3 days. Last week She noticed itching in her back, she had hives/rash from macrobid. She is currently is improving with rash.    Limitations Walking;House hold activities;Other (comment)  rising from sitting   How long can you sit comfortably? <1 hour   How long can you stand comfortably? no more than 1 hour   How long can you walk comfortably? < 1 hour   Diagnostic tests X rays back, LE's   Patient Stated Goals to be able to walk without difficulty, feeling unbalanced   Currently in Pain? Yes   Pain Score 3    Pain Location Back   Pain Orientation Left;Lower   Pain Descriptors / Indicators Sore   Pain Type Chronic pain   Pain Onset More than a month ago   Pain Frequency Intermittent      Objective: Gait; SPC, slowcadence, mild limp to left, no pelvis drop noted when using cane for support   Treatment:  Therapeutic exercise:patient performed with  VC, demonstration and guidance of therapist: goal: independent with home program, strength Sitting: both LE's (moist heat to back during sitting exercises; pain control, no adverse reactions) Knee flexion green resistive band 1x 20 Knee extension with 3# weights on ankles x 15 reps each Hip abduction (clam) with green resistive band x 20 reps Standing:  Step ups onto balance beam x 10 reps leading with each LE with UE support on counter  balance beam side stepping x 2 min. Step up laterally onto balance beam with focus on not dropping pelvis, tap opposite foot to beam and weight shift back to floor x 10 each LE with VC Standing diagonal walk with 3# in left hand and using SPC for balance x 1 min.   Patient response to treatment: patient demonstrated improved  motor control and technique with exercises with minimal cuing and repetition. Mild to moderate fatigue reported with exercises. No increased pain reported with exercises.          PT Education - 09/21/16 1200    Education provided Yes   Education Details correct alignment and motor control with standing exercises   Person(s) Educated Patient   Methods Explanation;Demonstration;Tactile cues;Verbal cues   Comprehension Verbalized understanding;Returned demonstration;Verbal cues required;Tactile cues required             PT Long Term Goals - 08/16/16 1025      PT LONG TERM GOAL #1   Title Patient will report decreased pain level to <2/10 with prolonged sitting >1 hour indicating improved function by 08/08/2016   Baseline sitting <1 hour with increased pain in back >5/10   Status Partially Met     PT LONG TERM GOAL #2   Title Patient will demonstrate improved self perceived disability due to back symptoms with improved MODI of 25% or less by 09/27/2016   Baseline MODI 52%; MODI 40%   Status Revised     PT LONG TERM GOAL #3   Title Patient will demonstrate improved 10MW to 88ms or better by 09/27/2016 to demonstrate improved community ambulation   Baseline 10MW 14.5 seconds (.69 m/s)   Status New     PT LONG TERM GOAL #4   Title Patient will demonstrate improved TUG to 13 seconds or better by 09/27/2016 Demonstrating decreased fall risk   Baseline TUG 16.3 seconds   Status New     PT LONG TERM GOAL #5   Title Patient will be independent with home program for flexiblity, strength and balance exercises by 09/27/2016 to allow discharge from physical therapy with self management   Baseline patient requires assistance, VC to perform exercises with correct technique/alignment   Status Revised               Plan - 09/21/16 1144    Clinical Impression Statement Patient demonstrates improvement with exercises and strength, motor control. she requires minimal cuing to perform  exercises with correct technique and alignment.    Rehab Potential Good   Clinical Impairments Affecting Rehab Potential (+) motivated(-) chronic condition, obesity, osteoarthritis, rheumatoid arthritis   PT Frequency 2x / week   PT Duration 6 weeks   PT Treatment/Interventions Electrical Stimulation;Moist Heat;Patient/family education;Neuromuscular re-education;Therapeutic exercise;Manual techniques   PT Next Visit Plan progressive strengthening, endurance and balance exercises   PT Home Exercise Plan stabilization in sitting, standing, walking exercises/activities      Patient will benefit from skilled therapeutic intervention in order to improve the following deficits and impairments:  Decreased strength, Pain, Impaired perceived functional ability, Decreased  activity tolerance, Decreased endurance, Difficulty walking  Visit Diagnosis: Muscle weakness (generalized)  Difficulty in walking, not elsewhere classified  Chronic midline low back pain without sciatica     Problem List Patient Active Problem List   Diagnosis Date Noted  . Encounter for screening colonoscopy 06/23/2016  . Hypertension 04/26/2015  . Dry mouth 04/26/2015  . Bell palsy 09/04/2014  . Obesity 09/04/2014  . Allergic rhinitis 08/04/2014  . Anxiety 08/04/2014  . Cannot sleep 08/04/2014  . Neuropathic pain 08/04/2014  . Atrial fibrillation ([place]) 08/04/2014  . Avitaminosis D 08/04/2014  . Gastroesophageal reflux disease without esophagitis 11/07/2013  . Polypharmacy 08/19/2013  . Osteoarthritis 06/11/2013  . Rheumatoid arthritis ([place]) 06/11/2013  . Hypercholesteremia 05/12/2009  . Benign essential tremor 05/12/2009  . Elevated blood sugar 08/26/2008  . Adult hypothyroidism 08/26/2008  . Menopausal symptom 08/26/2008    Jomarie Longs PT 09/22/2016, 6:25 AM  [facility] [address], [place], [address] Phone: [phone]   Fax:   [phone]  Name: TWANNA RESH MRN: [id number] Date of Birth: March 03, 1941

## 2016-09-25 DIAGNOSIS — M0579 Rheumatoid arthritis with rheumatoid factor of multiple sites without organ or systems involvement: Secondary | ICD-10-CM | POA: Diagnosis not present

## 2016-09-26 ENCOUNTER — Encounter: Payer: Self-pay | Admitting: Physical Therapy

## 2016-09-26 ENCOUNTER — Ambulatory Visit: Payer: Medicare Other | Admitting: Physical Therapy

## 2016-09-26 DIAGNOSIS — M79605 Pain in left leg: Secondary | ICD-10-CM | POA: Diagnosis not present

## 2016-09-26 DIAGNOSIS — G8929 Other chronic pain: Secondary | ICD-10-CM | POA: Diagnosis not present

## 2016-09-26 DIAGNOSIS — R262 Difficulty in walking, not elsewhere classified: Secondary | ICD-10-CM | POA: Diagnosis not present

## 2016-09-26 DIAGNOSIS — M545 Low back pain: Secondary | ICD-10-CM | POA: Diagnosis not present

## 2016-09-26 DIAGNOSIS — M6281 Muscle weakness (generalized): Secondary | ICD-10-CM

## 2016-09-27 NOTE — Therapy (Signed)
[facility] [address] [place], [address] Phone: [phone]   Fax:  [phone]  Physical Therapy Treatment  Patient Details  Name: Glenda Williams MRN: [id number] Date of Birth: [birth year] Referring Provider: Leeanne Mannan., Eugene Gavia MD  Encounter Date: 09/26/2016      PT End of Session - 09/26/16 1247    Visit Number 18   Number of Visits 24   Date for PT Re-Evaluation 09/27/16   Authorization Type 18   Authorization Time Period 20 (G code)   PT Start Time 1120   PT Stop Time 1215   PT Time Calculation (min) 55 min   Activity Tolerance Patient tolerated treatment well;Patient limited by pain   Behavior During Therapy [facility] for tasks assessed/performed      Past Medical History:  Diagnosis Date  . A-fib ([place])   . Arthritis   . GERD (gastroesophageal reflux disease)   . Heart disease   . Rheumatoid arthritis ([place])   . Thyroid disease   . Tremors of nervous system     Past Surgical History:  Procedure Laterality Date  . BREAST BIOPSY Left 1988   Benign  . BREAST BIOPSY Left 1987   Benign  . BROW LIFT Bilateral 05/23/2016   Procedure: BLEPHAROPLASTY upper eyelid; w/excess skin;  Surgeon: Karle Starch, MD;  Location: [facility];  Service: Ophthalmology;  Laterality: Bilateral;  . CARPAL TUNNEL RELEASE Bilateral   . CHOLECYSTECTOMY     Dr. Bary Castilla  . COLONOSCOPY  2006  . JOINT REPLACEMENT Bilateral    knees  . REPLACEMENT TOTAL KNEE Left 2005  . REPLACEMENT TOTAL KNEE Right 1998    There were no vitals filed for this visit.      Subjective Assessment - 09/26/16 1129    Subjective Patient reports she is improving endurance and strength and was able to walk with less difficulty the other day and did well.    Pertinent History Patient reports she has had chronic back pain with arthritis and disc pain for years. She has had X rays. She reports she had a fall at home and tripped  and fell forward into computer table with left sided bruing and pain left arm, ribs. she went to MD and had X rays of left arm, and knees with no fractures reported. She reports she had back pain which was due to UTI in March  2018 and she has been sitting in a chair to sleep. She has only been sleeping in her bed for the past 3 days. Last week She noticed itching in her back, she had hives/rash from macrobid. She is currently is improving with rash.    Limitations Walking;House hold activities;Other (comment)  rising from sitting   How long can you sit comfortably? <1 hour   How long can you stand comfortably? no more than 1 hour   How long can you walk comfortably? < 1 hour   Diagnostic tests X rays back, LE's   Patient Stated Goals to be able to walk without difficulty, feeling unbalanced   Currently in Pain? Yes   Pain Score 3    Pain Location Back   Pain Orientation Left;Lower   Pain Descriptors / Indicators Sore   Pain Type Chronic pain   Pain Onset More than a month ago   Pain Frequency Intermittent      Objective: Gait; SPC, slowcadence, mild limp to left, no pelvis drop noted when using cane  for support   Treatment:  Therapeutic exercise:patient performed with VC, demonstration and guidance of therapist: goal: independent with home program, strength Sitting: both LE's  Knee flexion green resistive band 1x 20 Knee extension with 3# weights on ankles 2 x 15 reps each Hip abduction (clam) with green resistive bandx 20 reps Standing:  Step ups onto balance beam x 10 reps leading with each LE with UE support on counter  balance beam side stepping x 2 min. Step up laterally onto balance beam with focus on not dropping pelvis, tap opposite foot to beam and weight shift back to floor x 10 each LE with VC Standing diagonal walk with 3# in left hand and using SPC for balance x 2 min.  Patient response to treatment: patient demonstrated improved technique with exercises with  minimal VC for correct alignment. Patient without increased pain during exercises.  Improved motor control with repetition and cuing       PT Education - 09/26/16 1200    Education provided Yes   Education Details exercise instruction for correct technique; progress towards goals   Person(s) Educated Patient   Methods Explanation;Demonstration;Verbal cues   Comprehension Verbalized understanding;Returned demonstration;Verbal cues required             PT Long Term Goals - 08/16/16 1025      PT LONG TERM GOAL #1   Title Patient will report decreased pain level to <2/10 with prolonged sitting >1 hour indicating improved function by 08/08/2016   Baseline sitting <1 hour with increased pain in back >5/10   Status Partially Met     PT LONG TERM GOAL #2   Title Patient will demonstrate improved self perceived disability due to back symptoms with improved MODI of 25% or less by 09/27/2016   Baseline MODI 52%; MODI 40%   Status Revised     PT LONG TERM GOAL #3   Title Patient will demonstrate improved 10MW to 27ms or better by 09/27/2016 to demonstrate improved community ambulation   Baseline 10MW 14.5 seconds (.69 m/s)   Status New     PT LONG TERM GOAL #4   Title Patient will demonstrate improved TUG to 13 seconds or better by 09/27/2016 Demonstrating decreased fall risk   Baseline TUG 16.3 seconds   Status New     PT LONG TERM GOAL #5   Title Patient will be independent with home program for flexiblity, strength and balance exercises by 09/27/2016 to allow discharge from physical therapy with self management   Baseline patient requires assistance, VC to perform exercises with correct technique/alignment   Status Revised               Plan - 09/26/16 1220    Clinical Impression Statement Patient demonstrates improvement with exercises with improved alignment and technique with minimal cuing. She should continue to improved with self managment of exercises. Plan re assess  next session and discharge from physical therapy.    Rehab Potential Good   Clinical Impairments Affecting Rehab Potential (+) motivated(-) chronic condition, obesity, osteoarthritis, rheumatoid arthritis   PT Frequency 2x / week   PT Duration 6 weeks   PT Treatment/Interventions Electrical Stimulation;Moist Heat;Patient/family education;Neuromuscular re-education;Therapeutic exercise;Manual techniques   PT Next Visit Plan reassessment, discharge from physical therapy   PT Home Exercise Plan stabilization in sitting, standing, walking exercises/activities      Patient will benefit from skilled therapeutic intervention in order to improve the following deficits and impairments:  Decreased strength, Pain, Impaired perceived functional  ability, Decreased activity tolerance, Decreased endurance, Difficulty walking  Visit Diagnosis: Difficulty in walking, not elsewhere classified  Muscle weakness (generalized)  Chronic midline low back pain without sciatica  Pain in left leg     Problem List Patient Active Problem List   Diagnosis Date Noted  . Encounter for screening colonoscopy 06/23/2016  . Hypertension 04/26/2015  . Dry mouth 04/26/2015  . Bell palsy 09/04/2014  . Obesity 09/04/2014  . Allergic rhinitis 08/04/2014  . Anxiety 08/04/2014  . Cannot sleep 08/04/2014  . Neuropathic pain 08/04/2014  . Atrial fibrillation ([place]) 08/04/2014  . Avitaminosis D 08/04/2014  . Gastroesophageal reflux disease without esophagitis 11/07/2013  . Polypharmacy 08/19/2013  . Osteoarthritis 06/11/2013  . Rheumatoid arthritis ([place]) 06/11/2013  . Hypercholesteremia 05/12/2009  . Benign essential tremor 05/12/2009  . Elevated blood sugar 08/26/2008  . Adult hypothyroidism 08/26/2008  . Menopausal symptom 08/26/2008    Jomarie Longs PT 09/27/2016, 7:23 AM  [facility] [address] [place], [address] Phone:  [phone]   Fax:  [phone]  Name: MATTY DEAMER MRN: [id number] Date of Birth: 11/27/1940

## 2016-09-28 ENCOUNTER — Ambulatory Visit: Payer: Medicare Other | Admitting: Physical Therapy

## 2016-09-28 DIAGNOSIS — G8929 Other chronic pain: Secondary | ICD-10-CM

## 2016-09-28 DIAGNOSIS — M545 Low back pain, unspecified: Secondary | ICD-10-CM

## 2016-09-28 DIAGNOSIS — R262 Difficulty in walking, not elsewhere classified: Secondary | ICD-10-CM

## 2016-09-28 DIAGNOSIS — M6281 Muscle weakness (generalized): Secondary | ICD-10-CM

## 2016-09-29 NOTE — Therapy (Signed)
[facility] [address] [place], [address] Phone: [phone]   Fax:  [phone]  Physical Therapy Discharge Summary  Patient Details  Name: Glenda Williams MRN: [id number] Date of Birth: Aug 01, 1940 Referring Provider: Leeanne Mannan., Eugene Gavia MD  Encounter Date: 09/28/2016   Patient began physical therapy on 06/27/2016 and has attended 18 sessions through 09/26/2016. She has achieved/partially met her goals and is independent in home program for continued self management of pain/symptoms and exercises as instructed. Plan discharge from physical therapy at this time.      Past Medical History:  Diagnosis Date  . A-fib ([place])   . Arthritis   . GERD (gastroesophageal reflux disease)   . Heart disease   . Rheumatoid arthritis ([place])   . Thyroid disease   . Tremors of nervous system     Past Surgical History:  Procedure Laterality Date  . BREAST BIOPSY Left 1988   Benign  . BREAST BIOPSY Left 1987   Benign  . BROW LIFT Bilateral 05/23/2016   Procedure: BLEPHAROPLASTY upper eyelid; w/excess skin;  Surgeon: Karle Starch, MD;  Location: [facility];  Service: Ophthalmology;  Laterality: Bilateral;  . CARPAL TUNNEL RELEASE Bilateral   . CHOLECYSTECTOMY     Dr. Bary Castilla  . COLONOSCOPY  2006  . JOINT REPLACEMENT Bilateral    knees  . REPLACEMENT TOTAL KNEE Left 2005  . REPLACEMENT TOTAL KNEE Right 1998    There were no vitals filed for this visit.      Subjective Assessment - 09/28/16 1125    Subjective Patient reports she is a little stiff in her lower back and reports she is able to exercise and is more aware of posture with daily activities. She reports feeling a great deal better from when she began physical therapy. She agrees to discharge at this time.    Pertinent History Patient reports she has had chronic back pain with arthritis and disc pain for years. She has had X rays. She reports  she had a fall at home and tripped and fell forward into computer table with left sided bruing and pain left arm, ribs. she went to MD and had X rays of left arm, and knees with no fractures reported. She reports she had back pain which was due to UTI in March  2018 and she has been sitting in a chair to sleep. She has only been sleeping in her bed for the past 3 days. Last week She noticed itching in her back, she had hives/rash from macrobid. She is currently is improving with rash.    Limitations Walking;House hold activities;Other (comment)  rising from sitting   How long can you sit comfortably? <1 hour   How long can you stand comfortably? no more than 1 hour   How long can you walk comfortably? < 1 hour   Diagnostic tests X rays back, LE's   Patient Stated Goals to be able to walk without difficulty, feeling unbalanced   Currently in Pain? Other (Comment)  best 0/10 worst pain 7-8/10 (from left shoulder pain/neck )      Objective: Outcome measures; MODI 32%, TUG 15 seconds (see goals for baseline) Gait: ambulating with SPC without limp; ambulates without SPC with right pelvis drop with stance left LE Strength: left LE decreased left hip strength hip abduction 3/5, ER 4/5        PT Long Term Goals - 09/28/16 1230  PT LONG TERM GOAL #1   Title Patient will report decreased pain level to <2/10 with prolonged sitting >1 hour indicating improved function by 09/27/2016   Baseline sitting <1 hour with increased pain in back >5/10   Status Partially Met     PT LONG TERM GOAL #2   Title Patient will demonstrate improved self perceived disability due to back symptoms with improved MODI of 25% or less by 09/27/2016   Baseline MODI 52%; MODI 40%; 09/28/2016 32%   Status Partially Met     PT LONG TERM GOAL #3   Title Patient will demonstrate improved 10MW to 54ms or better by 09/27/2016 to demonstrate improved community ambulation   Baseline 10MW 14.5 seconds (.69 m/s); not re assessed    Status Deferred     PT LONG TERM GOAL #4   Title Patient will demonstrate improved TUG to 13 seconds or better by 09/27/2016 Demonstrating decreased fall risk   Baseline TUG 16.3 seconds; 09/28/2016 15 seconds   Status Partially Met     PT LONG TERM GOAL #5   Title Patient will be independent with home program for flexiblity, strength and balance exercises by 09/27/2016 to allow discharge from physical therapy with self management   Baseline patient requires assistance, VC to perform exercises with correct technique/alignment   Status Achieved               Plan - 09/28/16 1200    Clinical Impression Statement Patient has improved  greatly with physical therapy intervention with increased strength and abiltiy to ambulate with less difficulty. She is independent with home program and able to continue with self managment. Goals have beend achieved/partially met and no further therapy is recommended at this time.    Rehab Potential Good   Clinical Impairments Affecting Rehab Potential (+) motivated(-) chronic condition, obesity, osteoarthritis, rheumatoid arthritis   PT Frequency 2x / week   PT Duration 6 weeks   PT Treatment/Interventions Electrical Stimulation;Moist Heat;Patient/family education;Neuromuscular re-education;Therapeutic exercise;Manual techniques   PT Next Visit Plan reassessment, discharge from physical therapy   PT Home Exercise Plan stabilization in sitting, standing, walking exercises/activities      Patient will benefit from skilled therapeutic intervention in order to improve the following deficits and impairments:  Decreased strength, Pain, Impaired perceived functional ability, Decreased activity tolerance, Decreased endurance, Difficulty walking  Visit Diagnosis: Difficulty in walking, not elsewhere classified  Muscle weakness (generalized)  Chronic midline low back pain without sciatica     Problem List Patient Active Problem List   Diagnosis Date  Noted  . Encounter for screening colonoscopy 06/23/2016  . Hypertension 04/26/2015  . Dry mouth 04/26/2015  . Bell palsy 09/04/2014  . Obesity 09/04/2014  . Allergic rhinitis 08/04/2014  . Anxiety 08/04/2014  . Cannot sleep 08/04/2014  . Neuropathic pain 08/04/2014  . Atrial fibrillation ([place] 08/04/2014  . Avitaminosis D 08/04/2014  . Gastroesophageal reflux disease without esophagitis 11/07/2013  . Polypharmacy 08/19/2013  . Osteoarthritis 06/11/2013  . Rheumatoid arthritis ([place] 06/11/2013  . Hypercholesteremia 05/12/2009  . Benign essential tremor 05/12/2009  . Elevated blood sugar 08/26/2008  . Adult hypothyroidism 08/26/2008  . Menopausal symptom 08/26/2008    BJomarie LongsPT 09/29/2016, 3:15 PM  [facility] [address] [place] [address]Phone: [phone]  Fax:  [phone] Name: Glenda UNCAPHERMRN: [id number] of Birth: [birth year]

## 2016-10-03 ENCOUNTER — Encounter: Payer: Medicare Other | Admitting: Physical Therapy

## 2016-10-11 ENCOUNTER — Encounter: Payer: Medicare Other | Admitting: Physical Therapy

## 2016-10-12 ENCOUNTER — Telehealth: Payer: Self-pay

## 2016-10-12 NOTE — Telephone Encounter (Signed)
1 mo supply 20 mg Xarelto left at front desk on new side. Thanks.

## 2016-10-12 NOTE — Telephone Encounter (Signed)
Patient called to see if we have samples of Xarelto 20 mg. CB [phone]

## 2016-10-12 NOTE — Telephone Encounter (Signed)
Pt advised-aa 

## 2016-10-26 ENCOUNTER — Other Ambulatory Visit: Payer: Self-pay | Admitting: Physician Assistant

## 2016-10-26 MED ORDER — OMEPRAZOLE 20 MG PO CPDR: DELAYED_RELEASE_CAPSULE | ORAL | 1 refills | Status: DC

## 2016-10-26 NOTE — Telephone Encounter (Signed)
[facility] faxed a refill request on the following medications:  omeprazole (PRILOSEC) 20 MG capsule.  Take 1 capsule by mouth 2 times daily before a meal.  [facility] mail order.

## 2016-10-27 DIAGNOSIS — G250 Essential tremor: Secondary | ICD-10-CM | POA: Diagnosis not present

## 2016-11-01 ENCOUNTER — Ambulatory Visit (INDEPENDENT_AMBULATORY_CARE_PROVIDER_SITE_OTHER): Payer: Medicare Other | Admitting: Urology

## 2016-11-01 ENCOUNTER — Encounter: Payer: Self-pay | Admitting: Urology

## 2016-11-01 VITALS — BP 125/73 | HR 74 | Ht 65.0 in | Wt 253.0 lb

## 2016-11-01 DIAGNOSIS — R3129 Other microscopic hematuria: Secondary | ICD-10-CM | POA: Diagnosis not present

## 2016-11-01 DIAGNOSIS — N3941 Urge incontinence: Secondary | ICD-10-CM | POA: Diagnosis not present

## 2016-11-01 LAB — URINALYSIS, COMPLETE
BILIRUBIN UA: NEGATIVE
Glucose, UA: NEGATIVE
KETONES UA: NEGATIVE
NITRITE UA: NEGATIVE
PH UA: 6 (ref 5.0–7.5)
Protein, UA: NEGATIVE
SPEC GRAV UA: 1.015 (ref 1.005–1.030)
UUROB: 2 mg/dL — AB (ref 0.2–1.0)

## 2016-11-01 LAB — BLADDER SCAN AMB NON-IMAGING: SCAN RESULT: 0

## 2016-11-01 LAB — MICROSCOPIC EXAMINATION
BACTERIA UA: NONE SEEN
RBC, UA: NONE SEEN /hpf (ref 0–?)

## 2016-11-01 MED ORDER — OXYBUTYNIN CHLORIDE 5 MG PO TABS: 5.0000 mg | ORAL_TABLET | Freq: Three times a day (TID) | ORAL | 3 refills | Status: DC

## 2016-11-01 NOTE — Progress Notes (Signed)
11/01/2016 9:50 AM   [name] [birth year] [id number]  Referring provider: Birdie Sons, [place] [address], [place] [address]  Chief Complaint  Patient presents with  . Hematuria    HPI: 76 year old female with urinary urgency and frequency now Mybetriq 50 mg daily.  She does think this medication helps give her more time to get to the bathroom on time without accidents.  She has issues with mobility secondary to her arthritis which also limits her ability to get to the bathroom.  No SUI.  She previously tried Vesicare 10 mg changed due to cost.  This medication was also helpful.  Over the past year, she's been treated free to urinary tract infections, one in 12/2015  and 06/2016.   She also has incidental microscopic hematuria greater than 1 year ago x 1.  All f/u UA negative.  No previous UA for reviewed today.  She does have a history of Afib on Xeralto.    UA today without blood.    PVR today 0.    Mybetriq 50 mg cost her more than $1000 for 3 months supply.     PMH: Past Medical History:  Diagnosis Date  . A-fib ([place])   . Arthritis   . GERD (gastroesophageal reflux disease)   . Heart disease   . Rheumatoid arthritis ([place])   . Thyroid disease   . Tremors of nervous system     Surgical History: Past Surgical History:  Procedure Laterality Date  . BREAST BIOPSY Left 1988   Benign  . BREAST BIOPSY Left 1987   Benign  . BROW LIFT Bilateral 05/23/2016   Procedure: BLEPHAROPLASTY upper eyelid; w/excess skin;  Surgeon: Karle Starch, MD;  Location: [facility];  Service: Ophthalmology;  Laterality: Bilateral;  . CARPAL TUNNEL RELEASE Bilateral   . CHOLECYSTECTOMY     Dr. Bary Castilla  . COLONOSCOPY  2006  . JOINT REPLACEMENT Bilateral    knees  . REPLACEMENT TOTAL KNEE Left 2005  . REPLACEMENT TOTAL KNEE Right 1998    Home Medications:  Allergies as of 11/01/2016      Reactions   Macrobid [nitrofurantoin Macrocrystal] Rash   Penicillins Rash      Medication List       Accurate as of 11/01/16 11:59 PM. Always use your most recent med list.          cholecalciferol 400 units Tabs tablet Commonly known as:  VITAMIN D Take 400 Units by mouth daily.   Fish Oil 1000 MG Caps Take 1 capsule by mouth daily.   folic acid 1 MG tablet Commonly known as:  FOLVITE Take 1 mg by mouth daily.   hydrochlorothiazide 12.5 MG tablet Commonly known as:  HYDRODIURIL TAKE 1 TABLET BY MOUTH  DAILY   levothyroxine 75 MCG tablet Commonly known as:  SYNTHROID, LEVOTHROID TAKE 1 TABLET BY MOUTH  DAILY   methotrexate 2.5 MG tablet Commonly known as:  RHEUMATREX 8 tabs by mouth weekly   mirabegron ER 25 MG Tb24 tablet Commonly known as:  MYRBETRIQ Take 1 tablet (25 mg total) by mouth daily.   omeprazole 20 MG capsule Commonly known as:  PRILOSEC TAKE 1 CAPSULE BY MOUTH 2  TIMES DAILY BEFORE A MEAL.   oxybutynin 5 MG tablet Commonly known as:  DITROPAN Take 1 tablet (5 mg total) by mouth 3 (three) times daily.   polyethylene glycol powder powder Commonly known as:  GLYCOLAX/MIRALAX 255 grams one bottle for colonoscopy prep  potassium chloride SA 20 MEQ tablet Commonly known as:  K-DUR,KLOR-CON Take 1 tablet by mouth  daily   propranolol 40 MG tablet Commonly known as:  INDERAL Take by mouth 2 (two) times daily. 2 tablets in the am, 1 tablet in pm   Red Yeast Rice 600 MG Caps Take 1 capsule by mouth daily.   REMICADE IV Inject into the vein. Every 6-8 weeks   sertraline 50 MG tablet Commonly known as:  ZOLOFT TAKE 3 TABLETS BY MOUTH  DAILY   XARELTO 20 MG Tabs tablet Generic drug:  rivaroxaban TAKE 1 TABLET BY MOUTH ONCE DAILY            Discharge Care Instructions        Start     Ordered   11/01/16 0000  Urinalysis, Complete     11/01/16 0940   11/01/16 0000  Bladder Scan (Post Void Residual) in office     11/01/16 0940   11/01/16 0000  oxybutynin (DITROPAN) 5 MG tablet  3 times  daily     11/01/16 1002   11/01/16 0000  Microscopic Examination     11/01/16 0000      Allergies:  Allergies  Allergen Reactions  . Macrobid [Nitrofurantoin Macrocrystal] Rash  . Penicillins Rash    Family History: Family History  Problem Relation Age of Onset  . Stroke Mother   . Hypertension Mother   . Heart disease Father   . Arthritis Father   . Parkinson's disease Sister   . Parkinson's disease Brother   . Breast cancer Neg Hx   . Bladder Cancer Neg Hx   . Kidney cancer Neg Hx     Social History:  reports that she has never smoked. She has never used smokeless tobacco. She reports that she does not drink alcohol or use drugs.  ROS: UROLOGY Frequent Urination?: Yes Hard to postpone urination?: Yes Burning/pain with urination?: No Get up at night to urinate?: Yes Leakage of urine?: Yes Urine stream starts and stops?: No Trouble starting stream?: No Do you have to strain to urinate?: No Blood in urine?: No Urinary tract infection?: No Sexually transmitted disease?: No Injury to kidneys or bladder?: No Painful intercourse?: No Weak stream?: No Currently pregnant?: No Vaginal bleeding?: No Last menstrual period?: n  Gastrointestinal Nausea?: No Vomiting?: No Indigestion/heartburn?: No Diarrhea?: No Constipation?: No  Constitutional Fever: No Night sweats?: No Weight loss?: No Fatigue?: No  Skin Skin rash/lesions?: No Itching?: No  Eyes Blurred vision?: No Double vision?: No  Ears/Nose/Throat Sore throat?: No Sinus problems?: No  Hematologic/Lymphatic Swollen glands?: No Easy bruising?: No  Cardiovascular Leg swelling?: No Chest pain?: No  Respiratory Cough?: No Shortness of breath?: No  Endocrine Excessive thirst?: No  Musculoskeletal Back pain?: Yes Joint pain?: No  Neurological Headaches?: No Dizziness?: No  Psychologic Depression?: No Anxiety?: No  Physical Exam: BP 125/73 (BP Location: Right Arm, Patient  Position: Sitting, Cuff Size: Large)   Pulse 74   Ht 5\' 5"  (1.651 m)   Wt 253 lb (114.8 kg)   BMI 42.10 kg/m   Constitutional:  Alert and oriented, No acute distress.  Ambulating with cane.  HEENT:  AT, moist mucus membranes.  Trachea midline, no masses. Cardiovascular: No clubbing, cyanosis.  Bilateral LE edema. Respiratory: Normal respiratory effort, no increased work of breathing. GI: Abdomen is soft, nontender, nondistended, no abdominal masses.  Obese.   GU: No CVA tenderness.  Skin: No rashes, bruises or suspicious lesions. Neurologic: Grossly intact, no  focal deficits, moving all 4 extremities. Psychiatric: Normal mood and affect.  Laboratory Data: Lab Results  Component Value Date   WBC 10.1 06/13/2016   HGB 12.4 06/13/2016   HCT 37.3 06/13/2016   MCV 92 06/13/2016   PLT 312 06/13/2016    Lab Results  Component Value Date   CREATININE 0.70 06/13/2016    Lab Results  Component Value Date   HGBA1C 6.0 06/13/2016    Urinalysis Results for orders placed or performed in visit on 11/01/16  Microscopic Examination  Result Value Ref Range   WBC, UA 0-5 0 - 5 /hpf   RBC, UA None seen 0 - 2 /hpf   Epithelial Cells (non renal) 0-10 0 - 10 /hpf   Bacteria, UA None seen None seen/Few  Urinalysis, Complete  Result Value Ref Range   Specific Gravity, UA 1.015 1.005 - 1.030   pH, UA 6.0 5.0 - 7.5   Color, UA Yellow Yellow   Appearance Ur Clear Clear   Leukocytes, UA 1+ (A) Negative   Protein, UA Negative Negative/Trace   Glucose, UA Negative Negative   Ketones, UA Negative Negative   RBC, UA 2+ (A) Negative   Bilirubin, UA Negative Negative   Urobilinogen, Ur 2.0 (H) 0.2 - 1.0 mg/dL   Nitrite, UA Negative Negative   Microscopic Examination See below:   Bladder Scan (Post Void Residual) in office  Result Value Ref Range   Scan Result 0     Assessment & Plan:    1. Urinary frequency/ urgency Multifactorial  Urgency improved with Mybetriq group  significantly. Episodes of urge incontinence Concerned about cost of medication, unable to afford on a regular basis Trial of Ditropan 5 mg 3 times a day-discussed side effects including dry eyes, dry mouth and constipation which is an issue for her (advised ppx colace regimen) She will let us know if this is more affordable ineffective-expect phone call  2. Urge incontinence As above  3. Microscopic hematuria No evidence today  Return in about 1 year (around 11/01/2017) for UA, PVR.  Hollice Espy, MD  [facility] [address] [place], [place] [address] [phone]

## 2016-11-07 DIAGNOSIS — M545 Low back pain: Secondary | ICD-10-CM | POA: Diagnosis not present

## 2016-11-07 DIAGNOSIS — Z79899 Other long term (current) drug therapy: Secondary | ICD-10-CM | POA: Diagnosis not present

## 2016-11-07 DIAGNOSIS — G8929 Other chronic pain: Secondary | ICD-10-CM | POA: Diagnosis not present

## 2016-11-07 DIAGNOSIS — G252 Other specified forms of tremor: Secondary | ICD-10-CM | POA: Diagnosis not present

## 2016-11-07 DIAGNOSIS — M0579 Rheumatoid arthritis with rheumatoid factor of multiple sites without organ or systems involvement: Secondary | ICD-10-CM | POA: Diagnosis not present

## 2016-11-08 ENCOUNTER — Ambulatory Visit: Payer: Medicare Other | Admitting: Urology

## 2016-11-21 ENCOUNTER — Encounter: Payer: Self-pay | Admitting: Urology

## 2016-12-13 ENCOUNTER — Encounter: Payer: Self-pay | Admitting: Physician Assistant

## 2016-12-13 ENCOUNTER — Ambulatory Visit (INDEPENDENT_AMBULATORY_CARE_PROVIDER_SITE_OTHER): Payer: Medicare Other | Admitting: Physician Assistant

## 2016-12-13 VITALS — BP 128/76 | HR 64 | Temp 98.6°F | Resp 16 | Ht 65.0 in | Wt 241.0 lb

## 2016-12-13 DIAGNOSIS — I4891 Unspecified atrial fibrillation: Secondary | ICD-10-CM | POA: Diagnosis not present

## 2016-12-13 DIAGNOSIS — Z23 Encounter for immunization: Secondary | ICD-10-CM

## 2016-12-13 DIAGNOSIS — F419 Anxiety disorder, unspecified: Secondary | ICD-10-CM | POA: Diagnosis not present

## 2016-12-13 DIAGNOSIS — M069 Rheumatoid arthritis, unspecified: Secondary | ICD-10-CM

## 2016-12-13 DIAGNOSIS — I10 Essential (primary) hypertension: Secondary | ICD-10-CM | POA: Diagnosis not present

## 2016-12-13 NOTE — Patient Instructions (Signed)

## 2016-12-13 NOTE — Progress Notes (Signed)
Patient: Glenda Williams Female    DOB: Apr 13, 1940   76 y.o.   MRN: [id number] Visit Date: 12/13/2016  Today's Provider: Trinna Post, PA-C   Chief Complaint  Patient presents with  . Hypertension  . Hyperglycemia  . Hyperlipidemia   Subjective:    HPI    Hyperglycemia, Follow-up:   Lab Results  Component Value Date   HGBA1C 6.0 06/13/2016   HGBA1C 5.8 11/23/2015   HGBA1C 5.9 07/26/2015   Last seen for hyperglycemia 6 months ago.  Management since then includes None. She reports excellent compliance with treatment. She is not having side effects.  Current symptoms include none and have been stable. Home blood sugar records: Generally in the 90's  Episodes of hypoglycemia? no   Weight trend: stable Prior visit with dietician: no Current diet: in general, a "healthy" diet   Current exercise: yard work  ------------------------------------------------------------------------   Hypertension, follow-up:  BP Readings from Last 3 Encounters:  12/13/16 128/76  11/01/16 125/73  06/22/16 130/64    She was last seen for hypertension 6 months ago.  BP at that visit was 120/70's. Management since that visit includes None.She reports excellent compliance with treatment. She is taking Metoprolol and HCTZ. She is having side effects.  She is exercising. She is adherent to low salt diet.   Outside blood pressures are 120/70's. She is experiencing none.  Patient denies none.   Cardiovascular risk factors include advanced age (older than 11 for men, 38 for women) and diabetes mellitus.  Use of agents associated with hypertension: none.   ------------------------------------------------------------------------    Lipid/Cholesterol, Follow-up:   Last seen for this 6 months ago.  Management since that visit includes None.  Last Lipid Panel:    Component Value Date/Time   CHOL 168 06/13/2016 1022   CHOL 143 02/13/2014 0356   TRIG 84 06/13/2016 1022   TRIG 87 02/13/2014 0356   HDL 41 06/13/2016 1022   HDL 31 (L) 02/13/2014 0356   CHOLHDL 4.1 06/13/2016 1022   VLDL 17 02/13/2014 0356   LDLCALC 110 (H) 06/13/2016 1022   [place] 95 02/13/2014 0356    She reports excellent compliance with treatment. She is not having side effects.   Wt Readings from Last 3 Encounters:  12/13/16 241 lb (109.3 kg)  11/01/16 253 lb (114.8 kg)  06/22/16 251 lb (113.9 kg)   Hypothyroidism  Stable. No new symptoms. Taking Synthroid without issue.   Atrial Fibrillation  Goes in and out of palpitations, increasing pulse. She is currently on Xarelto and followed by cardiology. Requesting samples of Xarelto today as she is in the donut hole.   ------------------------------------------------------------------------       Allergies  Allergen Reactions  . Macrobid [Nitrofurantoin Macrocrystal] Rash  . Penicillins Rash     Current Outpatient Prescriptions:  .  cholecalciferol (VITAMIN D) 400 units TABS tablet, Take 400 Units by mouth daily., Disp: , Rfl:  .  folic acid (FOLVITE) 1 MG tablet, Take 1 mg by mouth daily., Disp: , Rfl:  .  hydrochlorothiazide (HYDRODIURIL) 12.5 MG tablet, TAKE 1 TABLET BY MOUTH  DAILY, Disp: 90 tablet, Rfl: 4 .  InFLIXimab (REMICADE IV), Inject into the vein. Every 6-8 weeks, Disp: , Rfl:  .  levothyroxine (SYNTHROID, LEVOTHROID) 75 MCG tablet, TAKE 1 TABLET BY MOUTH  DAILY, Disp: 90 tablet, Rfl: 4 .  methotrexate (RHEUMATREX) 2.5 MG tablet, 8 tabs by mouth weekly, Disp: , Rfl:  .  mirabegron ER (MYRBETRIQ)  25 MG TB24 tablet, Take 1 tablet (25 mg total) by mouth daily., Disp: 30 tablet, Rfl: 3 .  Omega-3 Fatty Acids (FISH OIL) 1000 MG CAPS, Take 1 capsule by mouth daily., Disp: , Rfl:  .  omeprazole (PRILOSEC) 20 MG capsule, TAKE 1 CAPSULE BY MOUTH 2  TIMES DAILY BEFORE A MEAL., Disp: 180 capsule, Rfl: 1 .  oxybutynin (DITROPAN) 5 MG tablet, Take 1 tablet (5 mg total) by mouth 3 (three) times daily., Disp: 90 tablet,  Rfl: 3 .  polyethylene glycol powder (GLYCOLAX/MIRALAX) powder, 255 grams one bottle for colonoscopy prep, Disp: 255 g, Rfl: 0 .  potassium chloride SA (K-DUR,KLOR-CON) 20 MEQ tablet, Take 1 tablet by mouth  daily, Disp: 90 tablet, Rfl: 1 .  propranolol (INDERAL) 40 MG tablet, Take 40 mg by mouth. 2 tablets in the am, 1 tablet in pm, Disp: , Rfl:  .  Red Yeast Rice 600 MG CAPS, Take 1 capsule by mouth daily., Disp: , Rfl:  .  rivaroxaban (XARELTO) 20 MG TABS tablet, TAKE 1 TABLET BY MOUTH ONCE DAILY, Disp: , Rfl:  .  sertraline (ZOLOFT) 50 MG tablet, TAKE 3 TABLETS BY MOUTH  DAILY, Disp: 270 tablet, Rfl: 3  Review of Systems  Constitutional: Negative.   Respiratory: Negative.   Cardiovascular: Positive for palpitations (History of A-fib). Negative for chest pain and leg swelling.  Gastrointestinal: Negative.   Neurological: Negative for dizziness, light-headedness and headaches.    Social History  Substance Use Topics  . Smoking status: Never Smoker  . Smokeless tobacco: Never Used  . Alcohol use No   Objective:   BP 128/76 (BP Location: Left Arm, Patient Position: Sitting, Cuff Size: Large)   Pulse 64   Temp 98.6 F (37 C) (Oral)   Resp 16   Ht 5\' 5"  (1.651 m)   Wt 241 lb (109.3 kg)   BMI 40.10 kg/m  Vitals:   12/13/16 0928  BP: 128/76  Pulse: 64  Resp: 16  Temp: 98.6 F (37 C)  TempSrc: Oral  Weight: 241 lb (109.3 kg)  Height: 5\' 5"  (1.651 m)     Physical Exam  Constitutional: She is oriented to person, place, and time. She appears well-developed and well-nourished.  Cardiovascular: Normal rate, regular rhythm and normal heart sounds.   Pulmonary/Chest: Effort normal and breath sounds normal.  Abdominal: Soft. Bowel sounds are normal.  Musculoskeletal: She exhibits edema.  1+ pitting edema in bilateral lower extremities.  Neurological: She is alert and oriented to person, place, and time.  Skin: Skin is warm and dry.  Psychiatric: She has a normal mood and  affect. Her behavior is normal.        Assessment & Plan:     1. Essential hypertension  Stable, continue medications.  2. Atrial fibrillation, unspecified type ([place])  Followed by cardiology, five bottles of sample Xarelto 20 mg given today in the office.  3. Rheumatoid arthritis, involving unspecified site, unspecified rheumatoid factor presence ([place])  Recently got infused. Her rheumatologist has given the OK for flu vaccine today, directed her wait until after next infusion for pneumonia vaccination.  4. Influenza vaccination given  - Flu vaccine HIGH DOSE PF (Fluzone High dose)  5. Anxiety  Stable. Continue Zoloft.  Return in about 6 months (around 06/13/2017) for HTN, anxiety, hypothyroidism.  The entirety of the information documented in the History of Present Illness, Review of Systems and Physical Exam were personally obtained by me. Portions of this information were initially documented by  Ashley Royalty, CMA and reviewed by me for thoroughness and accuracy.        Trinna Post, PA-C  [facility]

## 2016-12-14 ENCOUNTER — Other Ambulatory Visit: Payer: Self-pay | Admitting: Physician Assistant

## 2016-12-14 ENCOUNTER — Other Ambulatory Visit: Payer: Self-pay | Admitting: Family Medicine

## 2016-12-14 DIAGNOSIS — E039 Hypothyroidism, unspecified: Secondary | ICD-10-CM

## 2016-12-14 MED ORDER — LEVOTHYROXINE SODIUM 75 MCG PO TABS: 75.0000 ug | ORAL_TABLET | Freq: Every day | ORAL | 1 refills | Status: DC

## 2016-12-19 DIAGNOSIS — M0579 Rheumatoid arthritis with rheumatoid factor of multiple sites without organ or systems involvement: Secondary | ICD-10-CM | POA: Diagnosis not present

## 2017-01-22 ENCOUNTER — Telehealth: Payer: Self-pay | Admitting: Physician Assistant

## 2017-01-22 NOTE — Telephone Encounter (Signed)
Pt is requesting samples of the Rx rivaroxaban (XARELTO) 20 MG TABS tablet  CB#[phone]/MW

## 2017-01-22 NOTE — Telephone Encounter (Signed)
Advised patient as below.  

## 2017-01-22 NOTE — Telephone Encounter (Signed)
Yes, thank you.

## 2017-01-22 NOTE — Telephone Encounter (Signed)
Just FYI we are currently out of samples of Xarelto. Ok to advise the patient?

## 2017-01-29 DIAGNOSIS — Z79899 Other long term (current) drug therapy: Secondary | ICD-10-CM | POA: Diagnosis not present

## 2017-01-29 DIAGNOSIS — M0579 Rheumatoid arthritis with rheumatoid factor of multiple sites without organ or systems involvement: Secondary | ICD-10-CM | POA: Diagnosis not present

## 2017-02-02 DIAGNOSIS — I480 Paroxysmal atrial fibrillation: Secondary | ICD-10-CM | POA: Diagnosis not present

## 2017-02-02 DIAGNOSIS — F419 Anxiety disorder, unspecified: Secondary | ICD-10-CM | POA: Diagnosis not present

## 2017-02-02 DIAGNOSIS — I10 Essential (primary) hypertension: Secondary | ICD-10-CM | POA: Diagnosis not present

## 2017-02-02 DIAGNOSIS — I482 Chronic atrial fibrillation: Secondary | ICD-10-CM | POA: Diagnosis not present

## 2017-03-12 DIAGNOSIS — M0579 Rheumatoid arthritis with rheumatoid factor of multiple sites without organ or systems involvement: Secondary | ICD-10-CM | POA: Diagnosis not present

## 2017-03-13 ENCOUNTER — Other Ambulatory Visit: Payer: Self-pay | Admitting: Family Medicine

## 2017-03-13 DIAGNOSIS — I10 Essential (primary) hypertension: Secondary | ICD-10-CM

## 2017-04-06 ENCOUNTER — Telehealth: Payer: Self-pay | Admitting: Physician Assistant

## 2017-04-23 DIAGNOSIS — M0579 Rheumatoid arthritis with rheumatoid factor of multiple sites without organ or systems involvement: Secondary | ICD-10-CM | POA: Diagnosis not present

## 2017-05-01 ENCOUNTER — Other Ambulatory Visit: Payer: Self-pay | Admitting: Physician Assistant

## 2017-05-10 ENCOUNTER — Ambulatory Visit
Admission: RE | Admit: 2017-05-10 | Discharge: 2017-05-10 | Disposition: A | Discharge status: Home / Self Care | Payer: Medicare Other | Source: Ambulatory Visit | Attending: Physician Assistant | Admitting: Physician Assistant

## 2017-05-10 ENCOUNTER — Encounter: Payer: Self-pay | Admitting: Physician Assistant

## 2017-05-10 ENCOUNTER — Ambulatory Visit (INDEPENDENT_AMBULATORY_CARE_PROVIDER_SITE_OTHER): Payer: Medicare Other | Admitting: Physician Assistant

## 2017-05-10 VITALS — BP 132/68 | HR 76 | Temp 100.8°F | Resp 24

## 2017-05-10 DIAGNOSIS — R062 Wheezing: Secondary | ICD-10-CM | POA: Diagnosis not present

## 2017-05-10 DIAGNOSIS — R509 Fever, unspecified: Secondary | ICD-10-CM

## 2017-05-10 DIAGNOSIS — R059 Cough, unspecified: Secondary | ICD-10-CM

## 2017-05-10 DIAGNOSIS — R0602 Shortness of breath: Secondary | ICD-10-CM | POA: Insufficient documentation

## 2017-05-10 DIAGNOSIS — R05 Cough: Secondary | ICD-10-CM

## 2017-05-10 DIAGNOSIS — M069 Rheumatoid arthritis, unspecified: Secondary | ICD-10-CM

## 2017-05-10 DIAGNOSIS — J101 Influenza due to other identified influenza virus with other respiratory manifestations: Secondary | ICD-10-CM | POA: Diagnosis not present

## 2017-05-10 DIAGNOSIS — I10 Essential (primary) hypertension: Secondary | ICD-10-CM

## 2017-05-10 LAB — POCT INFLUENZA A/B
Influenza A, POC: POSITIVE — AB
Influenza B, POC: NEGATIVE

## 2017-05-10 MED ORDER — OSELTAMIVIR PHOSPHATE 75 MG PO CAPS: 75.0000 mg | ORAL_CAPSULE | Freq: Two times a day (BID) | ORAL | 0 refills | Status: AC

## 2017-05-10 MED ORDER — ALBUTEROL SULFATE HFA 108 (90 BASE) MCG/ACT IN AERS
2.0000 | INHALATION_SPRAY | Freq: Four times a day (QID) | RESPIRATORY_TRACT | 2 refills | Status: DC | PRN
Start: 2017-05-10 — End: 2017-11-02

## 2017-05-10 NOTE — Progress Notes (Signed)
Patient: Glenda Williams Female    DOB: [birth year]   77 y.o.   MRN: [id number] Visit Date: 05/10/2017  Today's Provider: Trinna Post, PA-C   Chief Complaint  Patient presents with  . Cough   Subjective:    Glenda Williams is a 77 y/o woman presenting today with cough and congestion. She has a history of RA and is on methotrexate and also receives remicade infusions. She has a history of HTN. Additionally, she endorses body aches.  Cough  This is a new problem. The current episode started in the past 7 days (4 days). The problem has been gradually worsening. The cough is productive of sputum. Associated symptoms include chest pain, chills, headaches, postnasal drip and shortness of breath.      Allergies  Allergen Reactions  . Macrobid [Nitrofurantoin Macrocrystal] Rash  . Penicillins Rash     Current Outpatient Medications:  .  cholecalciferol (VITAMIN D) 400 units TABS tablet, Take 400 Units by mouth daily., Disp: , Rfl:  .  folic acid (FOLVITE) 1 MG tablet, Take 1 mg by mouth daily., Disp: , Rfl:  .  hydrochlorothiazide (HYDRODIURIL) 12.5 MG tablet, TAKE 1 TABLET BY MOUTH  DAILY, Disp: 90 tablet, Rfl: 1 .  InFLIXimab (REMICADE IV), Inject into the vein. Every 6-8 weeks, Disp: , Rfl:  .  levothyroxine (SYNTHROID, LEVOTHROID) 75 MCG tablet, Take 1 tablet (75 mcg total) by mouth daily., Disp: 90 tablet, Rfl: 1 .  methotrexate (RHEUMATREX) 2.5 MG tablet, 8 tabs by mouth weekly, Disp: , Rfl:  .  mirabegron ER (MYRBETRIQ) 25 MG TB24 tablet, Take 1 tablet (25 mg total) by mouth daily., Disp: 30 tablet, Rfl: 3 .  Omega-3 Fatty Acids (FISH OIL) 1000 MG CAPS, Take 1 capsule by mouth daily., Disp: , Rfl:  .  omeprazole (PRILOSEC) 20 MG capsule, TAKE 1 CAPSULE BY MOUTH 2  TIMES DAILY BEFORE A MEAL., Disp: 180 capsule, Rfl: 1 .  oxybutynin (DITROPAN) 5 MG tablet, Take 1 tablet (5 mg total) by mouth 3 (three) times daily., Disp: 90 tablet, Rfl: 3 .  polyethylene glycol powder  (GLYCOLAX/MIRALAX) powder, 255 grams one bottle for colonoscopy prep, Disp: 255 g, Rfl: 0 .  potassium chloride SA (K-DUR,KLOR-CON) 20 MEQ tablet, Take 1 tablet by mouth  daily, Disp: 90 tablet, Rfl: 1 .  propranolol (INDERAL) 40 MG tablet, Take 40 mg by mouth. 2 tablets in the am, 1 tablet in pm, Disp: , Rfl:  .  Red Yeast Rice 600 MG CAPS, Take 1 capsule by mouth daily., Disp: , Rfl:  .  rivaroxaban (XARELTO) 20 MG TABS tablet, TAKE 1 TABLET BY MOUTH ONCE DAILY, Disp: , Rfl:  .  sertraline (ZOLOFT) 50 MG tablet, TAKE 3 TABLETS BY MOUTH  DAILY, Disp: 270 tablet, Rfl: 3  Review of Systems  Constitutional: Positive for chills.  HENT: Positive for postnasal drip.   Respiratory: Positive for cough and shortness of breath.   Cardiovascular: Positive for chest pain.  Neurological: Positive for headaches.    Social History   Tobacco Use  . Smoking status: Never Smoker  . Smokeless tobacco: Never Used  Substance Use Topics  . Alcohol use: No   Objective:   BP 132/68 (BP Location: Right Arm, Patient Position: Sitting, Cuff Size: Large)   Pulse 76   Temp (!) 100.8 F (38.2 C)   Resp (!) 24   SpO2 95%  Vitals:   05/10/17 0834  BP: 132/68  Pulse:  76  Resp: (!) 24  Temp: (!) 100.8 F (38.2 C)  SpO2: 95%     Physical Exam  Constitutional: She is oriented to person, place, and time. She appears well-developed and well-nourished.  HENT:  Right Ear: External ear normal.  Left Ear: External ear normal.  Mouth/Throat: Oropharynx is clear and moist. No oropharyngeal exudate.  Eyes: Right eye exhibits discharge. Left eye exhibits discharge.  Neck: Neck supple.  Cardiovascular: Normal rate and regular rhythm.  Pulmonary/Chest: Effort normal and breath sounds normal. No respiratory distress. She has no wheezes. She has no rales.  Lymphadenopathy:    She has no cervical adenopathy.  Neurological: She is alert and oriented to person, place, and time.  Skin: Skin is warm and dry.    Psychiatric: She has a normal mood and affect. Her behavior is normal.        Assessment & Plan:     1. Influenza A  - oseltamivir (TAMIFLU) 75 MG capsule; Take 1 capsule (75 mg total) by mouth 2 (two) times daily for 5 days.  Dispense: 10 capsule; Refill: 0  2. Cough  - DG Chest 2 View; Future  3. Fever, unspecified fever cause  - DG Chest 2 View; Future - POCT Influenza A/B  4. Wheeze  - albuterol (PROVENTIL HFA;VENTOLIN HFA) 108 (90 Base) MCG/ACT inhaler; Inhale 2 puffs into the lungs every 6 (six) hours as needed for wheezing or shortness of breath.  Dispense: 1 Inhaler; Refill: 2  5. Rheumatoid arthritis, involving unspecified site, unspecified rheumatoid factor presence (HCC)  Currently using remicade and methotrexate.  6. Essential hypertension  Stable today.        Trinna Post, PA-C  [facility]

## 2017-05-10 NOTE — Patient Instructions (Signed)

## 2017-05-14 ENCOUNTER — Ambulatory Visit (INDEPENDENT_AMBULATORY_CARE_PROVIDER_SITE_OTHER): Payer: Medicare Other | Admitting: Physician Assistant

## 2017-05-14 ENCOUNTER — Encounter: Payer: Self-pay | Admitting: Physician Assistant

## 2017-05-14 VITALS — BP 128/68 | HR 61 | Temp 98.1°F | Resp 16

## 2017-05-14 DIAGNOSIS — J101 Influenza due to other identified influenza virus with other respiratory manifestations: Secondary | ICD-10-CM | POA: Diagnosis not present

## 2017-05-14 NOTE — Patient Instructions (Signed)

## 2017-05-14 NOTE — Progress Notes (Signed)
Patient: Glenda Williams Female    DOB: 03-Jun-1940   77 y.o.   MRN: [id number] Visit Date: 05/14/2017  Today's Provider: Trinna Post, PA-C   Chief Complaint  Patient presents with  . Influenza   Subjective:    Glenda Williams is a 77 y/o woman presenting for follow up of influenza. She was diagnosed with influenza A last Thursday in clinic. Her CXR was normal. She was given Tamiflu and has completed treatment. She feels significantly better.  Influenza  This is a new problem. The current episode started in the past 7 days. The problem has been gradually improving. Associated symptoms include congestion, coughing, fatigue, headaches and nausea. Pertinent negatives include no abdominal pain, anorexia, arthralgias, change in bowel habit, chest pain, chills, diaphoresis, fever, joint swelling, myalgias, neck pain, numbness, rash, sore throat, swollen glands, urinary symptoms, vertigo, visual change, vomiting or weakness.       Allergies  Allergen Reactions  . Macrobid [Nitrofurantoin Macrocrystal] Rash  . Penicillins Rash     Current Outpatient Medications:  .  albuterol (PROVENTIL HFA;VENTOLIN HFA) 108 (90 Base) MCG/ACT inhaler, Inhale 2 puffs into the lungs every 6 (six) hours as needed for wheezing or shortness of breath., Disp: 1 Inhaler, Rfl: 2 .  cholecalciferol (VITAMIN D) 400 units TABS tablet, Take 400 Units by mouth daily., Disp: , Rfl:  .  folic acid (FOLVITE) 1 MG tablet, Take 1 mg by mouth daily., Disp: , Rfl:  .  hydrochlorothiazide (HYDRODIURIL) 12.5 MG tablet, TAKE 1 TABLET BY MOUTH  DAILY, Disp: 90 tablet, Rfl: 1 .  InFLIXimab (REMICADE IV), Inject into the vein. Every 6-8 weeks, Disp: , Rfl:  .  levothyroxine (SYNTHROID, LEVOTHROID) 75 MCG tablet, Take 1 tablet (75 mcg total) by mouth daily., Disp: 90 tablet, Rfl: 1 .  methotrexate (RHEUMATREX) 2.5 MG tablet, 8 tabs by mouth weekly, Disp: , Rfl:  .  mirabegron ER (MYRBETRIQ) 25 MG TB24 tablet, Take 1 tablet  (25 mg total) by mouth daily., Disp: 30 tablet, Rfl: 3 .  Omega-3 Fatty Acids (FISH OIL) 1000 MG CAPS, Take 1 capsule by mouth daily., Disp: , Rfl:  .  omeprazole (PRILOSEC) 20 MG capsule, TAKE 1 CAPSULE BY MOUTH 2  TIMES DAILY BEFORE A MEAL., Disp: 180 capsule, Rfl: 1 .  oseltamivir (TAMIFLU) 75 MG capsule, Take 1 capsule (75 mg total) by mouth 2 (two) times daily for 5 days., Disp: 10 capsule, Rfl: 0 .  oxybutynin (DITROPAN) 5 MG tablet, Take 1 tablet (5 mg total) by mouth 3 (three) times daily., Disp: 90 tablet, Rfl: 3 .  polyethylene glycol powder (GLYCOLAX/MIRALAX) powder, 255 grams one bottle for colonoscopy prep, Disp: 255 g, Rfl: 0 .  potassium chloride SA (K-DUR,KLOR-CON) 20 MEQ tablet, Take 1 tablet by mouth  daily, Disp: 90 tablet, Rfl: 1 .  propranolol (INDERAL) 40 MG tablet, Take 40 mg by mouth. 2 tablets in the am, 1 tablet in pm, Disp: , Rfl:  .  Red Yeast Rice 600 MG CAPS, Take 1 capsule by mouth daily., Disp: , Rfl:  .  rivaroxaban (XARELTO) 20 MG TABS tablet, TAKE 1 TABLET BY MOUTH ONCE DAILY, Disp: , Rfl:  .  sertraline (ZOLOFT) 50 MG tablet, TAKE 3 TABLETS BY MOUTH  DAILY, Disp: 270 tablet, Rfl: 3  Review of Systems  Constitutional: Positive for fatigue. Negative for activity change, appetite change, chills, diaphoresis, fever and unexpected weight change.  HENT: Positive for congestion and postnasal drip.  Negative for ear discharge, ear pain, rhinorrhea, sinus pressure, sinus pain, sneezing and sore throat.   Respiratory: Positive for cough and wheezing. Negative for apnea, choking, chest tightness, shortness of breath and stridor.   Cardiovascular: Negative for chest pain.  Gastrointestinal: Positive for diarrhea and nausea. Negative for abdominal pain, anorexia, change in bowel habit and vomiting.  Musculoskeletal: Negative for arthralgias, joint swelling, myalgias and neck pain.  Skin: Negative for rash.  Neurological: Positive for headaches. Negative for dizziness,  vertigo, weakness and numbness.    Social History   Tobacco Use  . Smoking status: Never Smoker  . Smokeless tobacco: Never Used  Substance Use Topics  . Alcohol use: No   Objective:   BP 128/68 (BP Location: Left Arm, Patient Position: Sitting, Cuff Size: Large)   Pulse 61   Temp 98.1 F (36.7 C) (Oral)   Resp 16   SpO2 96%  Vitals:   05/14/17 1307  BP: 128/68  Pulse: 61  Resp: 16  Temp: 98.1 F (36.7 C)  TempSrc: Oral  SpO2: 96%     Physical Exam  Constitutional: She is oriented to person, place, and time. She appears well-developed and well-nourished.  HENT:  Right Ear: Tympanic membrane and external ear normal.  Left Ear: Tympanic membrane and external ear normal.  Mouth/Throat: Oropharynx is clear and moist. No oropharyngeal exudate.  Eyes: Conjunctivae are normal.  Neck: Neck supple.  Cardiovascular: Normal rate and regular rhythm.  Pulmonary/Chest: Effort normal and breath sounds normal.  Lymphadenopathy:    She has no cervical adenopathy.  Neurological: She is alert and oriented to person, place, and time.  Skin: Skin is warm and dry.  Psychiatric: She has a normal mood and affect. Her behavior is normal.        Assessment & Plan:     1. Influenza A  Improved. Will see her in f/u for chronic issues.   Return in about 2 months (around 07/14/2017) for hypothyroidism, HTN .  The entirety of the information documented in the History of Present Illness, Review of Systems and Physical Exam were personally obtained by me. Portions of this information were initially documented by Ashley Royalty, CMA and reviewed by me for thoroughness and accuracy.          Trinna Post, PA-C  [facility]

## 2017-06-07 NOTE — Telephone Encounter (Signed)
complete

## 2017-06-12 DIAGNOSIS — M0579 Rheumatoid arthritis with rheumatoid factor of multiple sites without organ or systems involvement: Secondary | ICD-10-CM | POA: Diagnosis not present

## 2017-06-12 DIAGNOSIS — Z79899 Other long term (current) drug therapy: Secondary | ICD-10-CM | POA: Diagnosis not present

## 2017-07-05 ENCOUNTER — Ambulatory Visit (INDEPENDENT_AMBULATORY_CARE_PROVIDER_SITE_OTHER): Payer: Medicare Other

## 2017-07-05 VITALS — BP 114/60 | HR 68 | Temp 98.2°F | Ht 65.0 in | Wt 233.0 lb

## 2017-07-05 DIAGNOSIS — Z23 Encounter for immunization: Secondary | ICD-10-CM | POA: Diagnosis not present

## 2017-07-05 DIAGNOSIS — Z0000 Encounter for general adult medical examination without abnormal findings: Secondary | ICD-10-CM | POA: Diagnosis not present

## 2017-07-05 NOTE — Progress Notes (Signed)
Subjective:   Glenda Williams is a 77 y.o. female who presents for Medicare Annual (Subsequent) preventive examination.  Review of Systems:  N/A  Cardiac Risk Factors include: advanced age (>28men, >74 women);dyslipidemia;hypertension;obesity (BMI >30kg/m2)     Objective:     Vitals: BP 114/60 (BP Location: Right Arm)   Pulse 68   Temp 98.2 F (36.8 C) (Oral)   Ht 5\' 5"  (1.651 m)   Wt 233 lb (105.7 kg) Comment: Not steady- may be inaccurate.  BMI 38.77 kg/m   Body mass index is 38.77 kg/m.  Advanced Directives 07/05/2017 06/27/2016 05/23/2016 04/26/2015 04/12/2015 01/18/2015 10/16/2014  Does Patient Have a Medical Advance Directive? Yes Yes Yes Yes Yes Yes Yes  Type of Paramedic of [place];Living will [place];Living will [place];Living will Living will;Healthcare Power of Attorney Living will;Healthcare Power of Attorney Living will;Healthcare Power of [place];Living will  Does patient want to make changes to medical advance directive? - No - Patient declined No - Patient declined - - - -  Copy of [place] in Chart? No - copy requested - No - copy requested - - - -    Tobacco Social History   Tobacco Use  Smoking Status Never Smoker  Smokeless Tobacco Never Used     Counseling given: Not Answered   Clinical Intake:  Pre-visit preparation completed: Yes  Pain : No/denies pain Pain Score: 0-No pain     Nutritional Status: BMI > 30  Obese Nutritional Risks: None Diabetes: No  How often do you need to have someone help you when you read instructions, pamphlets, or other written materials from your doctor or pharmacy?: 1 - Never  Interpreter Needed?: No  Information entered by :: [facility], LPN  Past Medical History:  Diagnosis Date  . A-fib ([place])   . Arthritis   . GERD (gastroesophageal reflux disease)   . Heart disease   . Rheumatoid arthritis  ([place])   . Thyroid disease   . Tremors of nervous system    Past Surgical History:  Procedure Laterality Date  . BREAST BIOPSY Left 1988   Benign  . BREAST BIOPSY Left 1987   Benign  . BROW LIFT Bilateral 05/23/2016   Procedure: BLEPHAROPLASTY upper eyelid; w/excess skin;  Surgeon: Karle Starch, MD;  Location: [place];  Service: Ophthalmology;  Laterality: Bilateral;  . CARPAL TUNNEL RELEASE Bilateral   . CHOLECYSTECTOMY     Dr. Bary Castilla  . COLONOSCOPY  2006  . JOINT REPLACEMENT Bilateral    knees  . REPLACEMENT TOTAL KNEE Left 2005  . REPLACEMENT TOTAL KNEE Right 1998   Family History  Problem Relation Age of Onset  . Stroke Mother   . Hypertension Mother   . Heart disease Father   . Arthritis Father   . Parkinson's disease Sister   . Parkinson's disease Brother   . Bladder Cancer Brother   . Breast cancer Neg Hx   . Kidney cancer Neg Hx    Social History   Socioeconomic History  . Marital status: Married    Spouse name: Not on file  . Number of children: 2  . Years of education: Not on file  . Highest education level: Some college, no degree  Occupational History  . Not on file  Social Needs  . Financial resource strain: Not hard at all  . Food insecurity:    Worry: Never true    Inability:  Never true  . Transportation needs:    Medical: No    Non-medical: No  Tobacco Use  . Smoking status: Never Smoker  . Smokeless tobacco: Never Used  Substance and Sexual Activity  . Alcohol use: No  . Drug use: No  . Sexual activity: Not on file  Lifestyle  . Physical activity:    Days per week: Not on file    Minutes per session: Not on file  . Stress: Not at all  Relationships  . Social connections:    Talks on phone: Not on file    Gets together: Not on file    Attends religious service: Not on file    Active member of club or organization: Not on file    Attends meetings of clubs or organizations: Not on file    Relationship status: Not on file    Other Topics Concern  . Not on file  Social History Narrative  . Not on file    Outpatient Encounter Medications as of 07/05/2017  Medication Sig  . cholecalciferol (VITAMIN D) 400 units TABS tablet Take 400 Units by mouth daily.  . folic acid (FOLVITE) 1 MG tablet Take 1 mg by mouth daily.  . hydrochlorothiazide (HYDRODIURIL) 12.5 MG tablet TAKE 1 TABLET BY MOUTH  DAILY  . InFLIXimab (REMICADE IV) Inject into the vein. Every 6-8 weeks  . levothyroxine (SYNTHROID, LEVOTHROID) 75 MCG tablet Take 1 tablet (75 mcg total) by mouth daily.  . methotrexate (RHEUMATREX) 2.5 MG tablet 8 tabs by mouth weekly  . Omega-3 Fatty Acids (FISH OIL) 1000 MG CAPS Take 1 capsule by mouth daily.  Marland Kitchen omeprazole (PRILOSEC) 20 MG capsule TAKE 1 CAPSULE BY MOUTH 2  TIMES DAILY BEFORE A MEAL.  Marland Kitchen potassium chloride SA (K-DUR,KLOR-CON) 20 MEQ tablet Take 1 tablet by mouth  daily  . propranolol (INDERAL) 40 MG tablet Take 40 mg by mouth. 2 tablets in the am, 1 tablet in pm  . Red Yeast Rice 600 MG CAPS Take 1 capsule by mouth daily.  . rivaroxaban (XARELTO) 20 MG TABS tablet TAKE 1 TABLET BY MOUTH ONCE DAILY  . sertraline (ZOLOFT) 50 MG tablet TAKE 3 TABLETS BY MOUTH  DAILY  . albuterol (PROVENTIL HFA;VENTOLIN HFA) 108 (90 Base) MCG/ACT inhaler Inhale 2 puffs into the lungs every 6 (six) hours as needed for wheezing or shortness of breath. (Patient not taking: Reported on 07/05/2017)  . mirabegron ER (MYRBETRIQ) 25 MG TB24 tablet Take 1 tablet (25 mg total) by mouth daily. (Patient not taking: Reported on 07/05/2017)  . oxybutynin (DITROPAN) 5 MG tablet Take 1 tablet (5 mg total) by mouth 3 (three) times daily. (Patient not taking: Reported on 07/05/2017)  . polyethylene glycol powder (GLYCOLAX/MIRALAX) powder 255 grams one bottle for colonoscopy prep (Patient not taking: Reported on 07/05/2017)   No facility-administered encounter medications on file as of 07/05/2017.     Activities of Daily Living In your present state of  health, do you have any difficulty performing the following activities: 07/05/2017  Hearing? N  Vision? N  Difficulty concentrating or making decisions? N  Walking or climbing stairs? Y  Comment Due to pain and weakness.   Dressing or bathing? N  Doing errands, shopping? N  Preparing Food and eating ? N  Using the Toilet? N  In the past six months, have you accidently leaked urine? Y  Comment Yes, wears protection daily.   Do you have problems with loss of bowel control? N  Managing your  Medications? N  Managing your Finances? N  Housekeeping or managing your Housekeeping? N  Comment Does have help with certain cleaning duties.  Some recent data might be hidden    Patient Care Team: Paulene Floor as PCP - General (Physician Assistant) Bary Castilla, Forest Gleason, MD (General Surgery) Teodoro Spray, MD as Consulting Physician (Cardiology) Emmaline Kluver., MD as Consulting Physician (Rheumatology) Vladimir Crofts, MD as Consulting Physician (Neurology) Hollice Espy, MD as Consulting Physician (Urology)    Assessment:   This is a routine wellness examination for Sharaya.  Exercise Activities and Dietary recommendations Current Exercise Habits: Home exercise routine, Type of exercise: walking;Other - see comments(PT exercises), Time (Minutes): 30, Frequency (Times/Week): 3, Weekly Exercise (Minutes/Week): 90, Intensity: Mild, Exercise limited by: orthopedic condition(s)  Goals    . DIET - REDUCE CARBOHYDRATE INTAKE     Recommend cutting carbohydrates in half and cutting sugars out completely.        Fall Risk Fall Risk  07/05/2017 11/23/2015 11/09/2015 09/04/2014  Falls in the past year? No No Yes No  Comment - - Emmi Telephone Survey: data to providers prior to load -  Number falls in past yr: - - 2 or more -  Comment - - Emmi Telephone Survey Actual Response = 9 -  Injury with Fall? - - No -   Is the patient's home free of loose throw rugs in walkways, pet beds,  electrical cords, etc?   yes      Grab bars in the bathroom? yes      Handrails on the stairs?   no      Adequate lighting?   yes  Timed Get Up and Go performed: N/A  Depression Screen PHQ 2/9 Scores 07/05/2017 11/23/2015 09/04/2014  PHQ - 2 Score 0 0 -  Exception Documentation - - Medical reason     Cognitive Function     6CIT Screen 07/05/2017  What Year? 0 points  What month? 0 points  What time? 0 points  Count back from 20 0 points  Months in reverse 0 points  Repeat phrase 0 points  Total Score 0    Immunization History  Administered Date(s) Administered  . Influenza, High Dose Seasonal PF 12/11/2014, 12/13/2016  . Influenza-Unspecified 11/03/2015  . Pneumococcal Polysaccharide-23 07/05/2017  . Td 04/21/1999  . Tdap 04/21/1999    Qualifies for Shingles Vaccine? Due for Shingles vaccine. Declined my offer to administer today. Education has been provided regarding the importance of this vaccine. Pt has been advised to call her insurance company to determine her out of pocket expense. Advised she may also receive this vaccine at her local pharmacy or Health Dept. Verbalized acceptance and understanding.  Screening Tests Health Maintenance  Topic Date Due  . FOOT EXAM  11/06/1950  . OPHTHALMOLOGY EXAM  11/06/1950  . TETANUS/TDAP  04/20/2009  . COLONOSCOPY  11/25/2014  . URINE MICROALBUMIN  10/16/2015  . HEMOGLOBIN A1C  12/13/2016  . INFLUENZA VACCINE  10/04/2017  . DEXA SCAN  Completed  . PNA vac Low Risk Adult  Completed    Cancer Screenings: Lung: Low Dose CT Chest recommended if Age 30-80 years, 30 pack-year currently smoking OR have quit w/in 15years. Patient does not qualify. Breast:  Up to date on Mammogram? Yes   Up to date of Bone Density/Dexa? Yes Colorectal: Pt declines referral today.   Additional Screenings:  Hepatitis C Screening: N/A     Plan:  I have personally reviewed and addressed  the Medicare Annual Wellness questionnaire and have noted the  following in the patient's chart:  A. Medical and social history B. Use of alcohol, tobacco or illicit drugs  C. Current medications and supplements D. Functional ability and status E.  Nutritional status F.  Physical activity G. Advance directives H. List of other physicians I.  Hospitalizations, surgeries, and ER visits in previous 12 months J.  [place] such as hearing and vision if needed, cognitive and depression L. Referrals and appointments - none  In addition, I have reviewed and discussed with patient certain preventive protocols, quality metrics, and best practice recommendations. A written personalized care plan for preventive services as well as general preventive health recommendations were provided to patient.  See attached scanned questionnaire for additional information.   Signed,  Fabio Neighbors, LPN Nurse Health Advisor   Nurse Recommendations: Pt needs her Hgb A1c checked, urine microalbumin checked and a foot exam done at next OV. Pt to set up an eye exam this year. Pt declined a colonoscopy referral and the tetanus vaccine today.

## 2017-07-05 NOTE — Patient Instructions (Signed)
Glenda Williams , Thank you for taking time to come for your Medicare Wellness Visit. I appreciate your ongoing commitment to your health goals. Please review the following plan we discussed and let me know if I can assist you in the future.   Screening recommendations/referrals: Colonoscopy: Pt declines today.  Mammogram: Up to date Bone Density: Up to date Recommended yearly ophthalmology/optometry visit for glaucoma screening and checkup Recommended yearly dental visit for hygiene and checkup  Vaccinations: Influenza vaccine: Up to date Pneumococcal vaccine: Up to date Tdap vaccine: Pt declines today.  Shingles vaccine: Pt declines today.     Advanced directives: Please bring a copy of your POA (Power of Attorney) and/or Living Will to your next appointment.   Conditions/risks identified: Obesity- recommend cutting carbohydrates in half and cutting sugars out completely.   Next appointment: None. Pt declined setting up her CPE at this time.    Preventive Care 9 Years and Older, Female Preventive care refers to lifestyle choices and visits with your health care provider that can promote health and wellness. What does preventive care include?  A yearly physical exam. This is also called an annual well check.  Dental exams once or twice a year.  Routine eye exams. Ask your health care provider how often you should have your eyes checked.  Personal lifestyle choices, including:  Daily care of your teeth and gums.  Regular physical activity.  Eating a healthy diet.  Avoiding tobacco and drug use.  Limiting alcohol use.  Practicing safe sex.  Taking low-dose aspirin every day.  Taking vitamin and mineral supplements as recommended by your health care provider. What happens during an annual well check? The services and screenings done by your health care provider during your annual well check will depend on your age, overall health, lifestyle risk factors, and family  history of disease. Counseling  Your health care provider may ask you questions about your:  Alcohol use.  Tobacco use.  Drug use.  Emotional well-being.  Home and relationship well-being.  Sexual activity.  Eating habits.  History of falls.  Memory and ability to understand (cognition).  Work and work Statistician.  Reproductive health. Screening  You may have the following tests or measurements:  Height, weight, and BMI.  Blood pressure.  Lipid and cholesterol levels. These may be checked every 5 years, or more frequently if you are over 67 years old.  Skin check.  Lung cancer screening. You may have this screening every year starting at age 10 if you have a 30-pack-year history of smoking and currently smoke or have quit within the past 15 years.  Fecal occult blood test (FOBT) of the stool. You may have this test every year starting at age 61.  Flexible sigmoidoscopy or colonoscopy. You may have a sigmoidoscopy every 5 years or a colonoscopy every 10 years starting at age 35.  Hepatitis C blood test.  Hepatitis B blood test.  Sexually transmitted disease (STD) testing.  Diabetes screening. This is done by checking your blood sugar (glucose) after you have not eaten for a while (fasting). You may have this done every 1-3 years.  Bone density scan. This is done to screen for osteoporosis. You may have this done starting at age 40.  Mammogram. This may be done every 1-2 years. Talk to your health care provider about how often you should have regular mammograms. Talk with your health care provider about your test results, treatment options, and if necessary, the need for more tests.  Vaccines  Your health care provider may recommend certain vaccines, such as:  Influenza vaccine. This is recommended every year.  Tetanus, diphtheria, and acellular pertussis (Tdap, Td) vaccine. You may need a Td booster every 10 years.  Zoster vaccine. You may need this after  age 23.  Pneumococcal 13-valent conjugate (PCV13) vaccine. One dose is recommended after age 39.  Pneumococcal polysaccharide (PPSV23) vaccine. One dose is recommended after age 33. Talk to your health care provider about which screenings and vaccines you need and how often you need them. This information is not intended to replace advice given to you by your health care provider. Make sure you discuss any questions you have with your health care provider. Document Released: 03/19/2015 Document Revised: 11/10/2015 Document Reviewed: 12/22/2014 Elsevier Interactive Patient Education  2017 [place] Prevention in the Home Falls can cause injuries. They can happen to people of all ages. There are many things you can do to make your home safe and to help prevent falls. What can I do on the outside of my home?  Regularly fix the edges of walkways and driveways and fix any cracks.  Remove anything that might make you trip as you walk through a door, such as a raised step or threshold.  Trim any bushes or trees on the path to your home.  Use bright outdoor lighting.  Clear any walking paths of anything that might make someone trip, such as rocks or tools.  Regularly check to see if handrails are loose or broken. Make sure that both sides of any steps have handrails.  Any raised decks and porches should have guardrails on the edges.  Have any leaves, snow, or ice cleared regularly.  Use sand or salt on walking paths during winter.  Clean up any spills in your garage right away. This includes oil or grease spills. What can I do in the bathroom?  Use night lights.  Install grab bars by the toilet and in the tub and shower. Do not use towel bars as grab bars.  Use non-skid mats or decals in the tub or shower.  If you need to sit down in the shower, use a plastic, non-slip stool.  Keep the floor dry. Clean up any water that spills on the floor as soon as it  happens.  Remove soap buildup in the tub or shower regularly.  Attach bath mats securely with double-sided non-slip rug tape.  Do not have throw rugs and other things on the floor that can make you trip. What can I do in the bedroom?  Use night lights.  Make sure that you have a light by your bed that is easy to reach.  Do not use any sheets or blankets that are too big for your bed. They should not hang down onto the floor.  Have a firm chair that has side arms. You can use this for support while you get dressed.  Do not have throw rugs and other things on the floor that can make you trip. What can I do in the kitchen?  Clean up any spills right away.  Avoid walking on wet floors.  Keep items that you use a lot in easy-to-reach places.  If you need to reach something above you, use a strong step stool that has a grab bar.  Keep electrical cords out of the way.  Do not use floor polish or wax that makes floors slippery. If you must use wax, use non-skid floor wax.  Do not have throw rugs and other things on the floor that can make you trip. What can I do with my stairs?  Do not leave any items on the stairs.  Make sure that there are handrails on both sides of the stairs and use them. Fix handrails that are broken or loose. Make sure that handrails are as long as the stairways.  Check any carpeting to make sure that it is firmly attached to the stairs. Fix any carpet that is loose or worn.  Avoid having throw rugs at the top or bottom of the stairs. If you do have throw rugs, attach them to the floor with carpet tape.  Make sure that you have a light switch at the top of the stairs and the bottom of the stairs. If you do not have them, ask someone to add them for you. What else can I do to help prevent falls?  Wear shoes that:  Do not have high heels.  Have rubber bottoms.  Are comfortable and fit you well.  Are closed at the toe. Do not wear sandals.  If you  use a stepladder:  Make sure that it is fully opened. Do not climb a closed stepladder.  Make sure that both sides of the stepladder are locked into place.  Ask someone to hold it for you, if possible.  Clearly mark and make sure that you can see:  Any grab bars or handrails.  First and last steps.  Where the edge of each step is.  Use tools that help you move around (mobility aids) if they are needed. These include:  Canes.  Walkers.  Scooters.  Crutches.  Turn on the lights when you go into a dark area. Replace any light bulbs as soon as they burn out.  Set up your furniture so you have a clear path. Avoid moving your furniture around.  If any of your floors are uneven, fix them.  If there are any pets around you, be aware of where they are.  Review your medicines with your doctor. Some medicines can make you feel dizzy. This can increase your chance of falling. Ask your doctor what other things that you can do to help prevent falls. This information is not intended to replace advice given to you by your health care provider. Make sure you discuss any questions you have with your health care provider. Document Released: 12/17/2008 Document Revised: 07/29/2015 Document Reviewed: 03/27/2014 Elsevier Interactive Patient Education  2017 [employer].

## 2017-07-17 ENCOUNTER — Other Ambulatory Visit: Payer: Self-pay | Admitting: Physician Assistant

## 2017-07-17 DIAGNOSIS — E039 Hypothyroidism, unspecified: Secondary | ICD-10-CM

## 2017-07-17 NOTE — Telephone Encounter (Signed)
Needs her 6 mo follow up scheduled, due for hypothyroidism labs.

## 2017-07-24 DIAGNOSIS — M0579 Rheumatoid arthritis with rheumatoid factor of multiple sites without organ or systems involvement: Secondary | ICD-10-CM | POA: Diagnosis not present

## 2017-08-01 DIAGNOSIS — I10 Essential (primary) hypertension: Secondary | ICD-10-CM | POA: Diagnosis not present

## 2017-08-01 DIAGNOSIS — I482 Chronic atrial fibrillation: Secondary | ICD-10-CM | POA: Diagnosis not present

## 2017-08-01 DIAGNOSIS — G250 Essential tremor: Secondary | ICD-10-CM | POA: Diagnosis not present

## 2017-08-01 DIAGNOSIS — I480 Paroxysmal atrial fibrillation: Secondary | ICD-10-CM | POA: Diagnosis not present

## 2017-08-08 ENCOUNTER — Telehealth: Payer: Self-pay | Admitting: Physician Assistant

## 2017-08-08 NOTE — Telephone Encounter (Signed)
Patient will assistance by August, that's when she will fall in the  donut hole.

## 2017-08-28 DIAGNOSIS — G252 Other specified forms of tremor: Secondary | ICD-10-CM | POA: Diagnosis not present

## 2017-08-28 DIAGNOSIS — Z79899 Other long term (current) drug therapy: Secondary | ICD-10-CM | POA: Diagnosis not present

## 2017-08-28 DIAGNOSIS — M0579 Rheumatoid arthritis with rheumatoid factor of multiple sites without organ or systems involvement: Secondary | ICD-10-CM | POA: Diagnosis not present

## 2017-08-28 DIAGNOSIS — I878 Other specified disorders of veins: Secondary | ICD-10-CM | POA: Diagnosis not present

## 2017-09-17 ENCOUNTER — Other Ambulatory Visit: Payer: Self-pay | Admitting: Physician Assistant

## 2017-09-17 DIAGNOSIS — I10 Essential (primary) hypertension: Secondary | ICD-10-CM

## 2017-10-09 DIAGNOSIS — M0579 Rheumatoid arthritis with rheumatoid factor of multiple sites without organ or systems involvement: Secondary | ICD-10-CM | POA: Diagnosis not present

## 2017-10-19 ENCOUNTER — Telehealth: Payer: Self-pay

## 2017-10-19 NOTE — Telephone Encounter (Signed)
-----   Message from [place] sent at 10/19/2017  4:07 PM EDT ----- Regarding: Xarelto Samples Patient is in the donut hole and would like 7 weeks of samples for Xarelto if possible.  I will mail her a patient assistance application for [employer] to see if she qualifies. VDM (DD)

## 2017-10-22 NOTE — Telephone Encounter (Signed)
OK to give samples of current dose.

## 2017-10-23 NOTE — Telephone Encounter (Signed)
Samples at the front desk.  Pt advised.   Thanks,   -Zyeir Dymek  

## 2017-10-26 ENCOUNTER — Other Ambulatory Visit: Payer: Self-pay | Admitting: Family Medicine

## 2017-10-26 ENCOUNTER — Other Ambulatory Visit: Payer: Self-pay | Admitting: Physician Assistant

## 2017-10-26 DIAGNOSIS — E039 Hypothyroidism, unspecified: Secondary | ICD-10-CM

## 2017-10-26 DIAGNOSIS — F419 Anxiety disorder, unspecified: Secondary | ICD-10-CM

## 2017-10-30 DIAGNOSIS — G250 Essential tremor: Secondary | ICD-10-CM | POA: Diagnosis not present

## 2017-10-31 ENCOUNTER — Encounter: Payer: Self-pay | Admitting: Urology

## 2017-10-31 ENCOUNTER — Ambulatory Visit (INDEPENDENT_AMBULATORY_CARE_PROVIDER_SITE_OTHER): Payer: Medicare Other | Admitting: Urology

## 2017-10-31 VITALS — BP 146/69 | HR 64 | Ht 66.0 in | Wt 235.0 lb

## 2017-10-31 DIAGNOSIS — N3000 Acute cystitis without hematuria: Secondary | ICD-10-CM

## 2017-10-31 DIAGNOSIS — N3281 Overactive bladder: Secondary | ICD-10-CM

## 2017-10-31 DIAGNOSIS — R300 Dysuria: Secondary | ICD-10-CM | POA: Diagnosis not present

## 2017-10-31 DIAGNOSIS — N3941 Urge incontinence: Secondary | ICD-10-CM

## 2017-10-31 LAB — MICROSCOPIC EXAMINATION: WBC, UA: >30 /hpf — ABNORMAL HIGH (ref 0–5)

## 2017-10-31 LAB — URINALYSIS, COMPLETE
Bilirubin, UA: NEGATIVE
Glucose, UA: NEGATIVE
Ketones, UA: NEGATIVE
Nitrite, UA: POSITIVE — AB
PH UA: 6 (ref 5.0–7.5)
Specific Gravity, UA: 1.015 (ref 1.005–1.030)
Urobilinogen, Ur: 2 mg/dL — ABNORMAL HIGH (ref 0.2–1.0)

## 2017-10-31 LAB — BLADDER SCAN AMB NON-IMAGING

## 2017-10-31 MED ORDER — SULFAMETHOXAZOLE-TRIMETHOPRIM 800-160 MG PO TABS: 1.0000 | ORAL_TABLET | Freq: Two times a day (BID) | ORAL | 0 refills | Status: DC

## 2017-10-31 NOTE — Progress Notes (Signed)
10/31/2017 1:36 PM   Glenda Williams 1941/02/11 [id number]  Referring provider: Trinna Post, PA-C [address] [place], [place] [address]  Chief Complaint  Patient presents with  . Urinary Frequency    1year    HPI: 77 year old female who returns today for one-year follow-up.  She has a personal history of urinary urgency and frequency with occasional urgency.  This is exacerbated by her rheumatoid arthritis mobility issues.  She feels the urge, she sometimes does not have time to get to the bathroom before having accident.  She tried Mybetriq but developed hives.  She also tried ditropan but also had a rash.  She previously tried Vesicare 10 mg changed due to cost.   She continues bothersome symptoms.  In addition, she developed dysuria over the past week, but no burning with urination but not every time..  No fevers or chills.  No change in her baseline urgency/frequency.  No flank pain.  No gross hematuria.  She believes she has UTI.  This is her first UTI of the past 12 months.  PVR today 55 cc   PMH: Past Medical History:  Diagnosis Date  . A-fib ([place])   . Arthritis   . GERD (gastroesophageal reflux disease)   . Heart disease   . Rheumatoid arthritis ([place])   . Thyroid disease   . Tremors of nervous system     Surgical History: Past Surgical History:  Procedure Laterality Date  . BREAST BIOPSY Left 1988   Benign  . BREAST BIOPSY Left 1987   Benign  . BROW LIFT Bilateral 05/23/2016   Procedure: BLEPHAROPLASTY upper eyelid; w/excess skin;  Surgeon: Karle Starch, MD;  Location: [facility];  Service: Ophthalmology;  Laterality: Bilateral;  . CARPAL TUNNEL RELEASE Bilateral   . CHOLECYSTECTOMY     Dr. Bary Castilla  . COLONOSCOPY  2006  . JOINT REPLACEMENT Bilateral    knees  . REPLACEMENT TOTAL KNEE Left 2005  . REPLACEMENT TOTAL KNEE Right 1998    Home Medications:  Allergies as of 10/31/2017      Reactions   Macrobid  [nitrofurantoin Macrocrystal] Rash   Mirabegron Rash   Penicillins Rash      Medication List        Accurate as of 10/31/17 11:59 PM. Always use your most recent med list.          albuterol 108 (90 Base) MCG/ACT inhaler Commonly known as:  PROVENTIL HFA;VENTOLIN HFA Inhale 2 puffs into the lungs every 6 (six) hours as needed for wheezing or shortness of breath.   cholecalciferol 400 units Tabs tablet Commonly known as:  VITAMIN D Take 400 Units by mouth daily.   Fish Oil 1000 MG Caps Take 1 capsule by mouth daily.   folic acid 1 MG tablet Commonly known as:  FOLVITE Take 1 mg by mouth daily.   hydrochlorothiazide 12.5 MG tablet Commonly known as:  HYDRODIURIL TAKE 1 TABLET BY MOUTH  DAILY   levothyroxine 75 MCG tablet Commonly known as:  SYNTHROID, LEVOTHROID TAKE 1 TABLET BY MOUTH  DAILY   methotrexate 2.5 MG tablet Commonly known as:  RHEUMATREX 8 tabs by mouth weekly   omeprazole 20 MG capsule Commonly known as:  PRILOSEC TAKE 1 CAPSULE BY MOUTH 2  TIMES DAILY BEFORE A MEAL.   polyethylene glycol powder powder Commonly known as:  GLYCOLAX/MIRALAX 255 grams one bottle for colonoscopy prep   potassium chloride SA 20 MEQ tablet Commonly known as:  K-DUR,KLOR-CON Take  1 tablet by mouth  daily   propranolol 40 MG tablet Commonly known as:  INDERAL Take 40 mg by mouth. 2 tablets in the am, 1 tablet in pm   Red Yeast Rice 600 MG Caps Take 1 capsule by mouth daily.   REMICADE IV Inject into the vein. Every 6-8 weeks   sertraline 50 MG tablet Commonly known as:  ZOLOFT TAKE 3 TABLETS BY MOUTH  DAILY   sulfamethoxazole-trimethoprim 800-160 MG tablet Commonly known as:  BACTRIM DS,SEPTRA DS Take 1 tablet by mouth 2 (two) times daily.   XARELTO 20 MG Tabs tablet Generic drug:  rivaroxaban TAKE 1 TABLET BY MOUTH ONCE DAILY       Allergies:  Allergies  Allergen Reactions  . Bactrim [Sulfamethoxazole-Trimethoprim] Rash  . Macrobid [Nitrofurantoin  Macrocrystal] Rash  . Mirabegron Rash  . Oxybutynin Rash  . Penicillins Rash    Family History: Family History  Problem Relation Age of Onset  . Stroke Mother   . Hypertension Mother   . Heart disease Father   . Arthritis Father   . Parkinson's disease Sister   . Parkinson's disease Brother   . Bladder Cancer Brother   . Breast cancer Neg Hx   . Kidney cancer Neg Hx     Social History:  reports that she has never smoked. She has never used smokeless tobacco. She reports that she does not drink alcohol or use drugs.  ROS: UROLOGY Frequent Urination?: No Hard to postpone urination?: No Burning/pain with urination?: Yes Get up at night to urinate?: No Leakage of urine?: No Urine stream starts and stops?: No Trouble starting stream?: No Do you have to strain to urinate?: No Blood in urine?: No Urinary tract infection?: No Sexually transmitted disease?: No Injury to kidneys or bladder?: No Painful intercourse?: No Weak stream?: No Currently pregnant?: No Vaginal bleeding?: No Last menstrual period?: n  Gastrointestinal Nausea?: No Vomiting?: No Indigestion/heartburn?: No Diarrhea?: No Constipation?: No  Constitutional Fever: No Night sweats?: No Weight loss?: No Fatigue?: No  Skin Skin rash/lesions?: No Itching?: No  Eyes Blurred vision?: No Double vision?: No  Ears/Nose/Throat Sore throat?: No Sinus problems?: No  Hematologic/Lymphatic Swollen glands?: No Easy bruising?: No  Cardiovascular Leg swelling?: No Chest pain?: No  Respiratory Cough?: No Shortness of breath?: No  Endocrine Excessive thirst?: No  Musculoskeletal Back pain?: No Joint pain?: No  Neurological Headaches?: No Dizziness?: No  Psychologic Depression?: No Anxiety?: No  Physical Exam: BP (!) 146/69   Pulse 64   Ht 5\' 6"  (1.676 m)   Wt 235 lb (106.6 kg)   BMI 37.93 kg/m   Constitutional:  Alert and oriented, No acute distress. HEENT:  AT, moist mucus  membranes.  Trachea midline, no masses. Cardiovascular: No clubbing, cyanosis, or edema. Respiratory: Normal respiratory effort, no increased work of breathing. GI: Abdomen is soft, nontender, nondistended, no abdominal masses, obese. Skin: No rashes, bruises or suspicious lesions. Neurologic: Grossly intact, no focal deficits, moving all 4 extremities. MSK: Arthritis appreciated but fairly decent hand mobility. Psychiatric: Normal mood and affect.  Laboratory Data: Lab Results  Component Value Date   WBC 10.1 06/13/2016   HGB 12.4 06/13/2016   HCT 37.3 06/13/2016   MCV 92 06/13/2016   PLT 312 06/13/2016    Lab Results  Component Value Date   CREATININE 0.70 06/13/2016     Lab Results  Component Value Date   HGBA1C 6.0 06/13/2016    Urinalysis Results for orders placed or performed in visit  on 10/31/17  CULTURE, URINE COMPREHENSIVE  Result Value Ref Range   Urine Culture, Comprehensive Preliminary report (A)    Organism ID, Bacteria Gram negative rods (A)    Organism ID, Bacteria Gram negative rods (A)   Microscopic Examination  Result Value Ref Range   WBC, UA >30 (H) 0 - 5 /hpf   RBC, UA 3-10 (A) 0 - 2 /hpf   Epithelial Cells (non renal) 0-10 0 - 10 /hpf   Mucus, UA Present (A) Not Estab.   Bacteria, UA Many (A) None seen/Few  Urinalysis, Complete  Result Value Ref Range   Specific Gravity, UA 1.015 1.005 - 1.030   pH, UA 6.0 5.0 - 7.5   Color, UA Yellow Yellow   Appearance Ur Cloudy (A) Clear   Leukocytes, UA 1+ (A) Negative   Protein, UA 1+ (A) Negative/Trace   Glucose, UA Negative Negative   Ketones, UA Negative Negative   RBC, UA 2+ (A) Negative   Bilirubin, UA Negative Negative   Urobilinogen, Ur 2.0 (H) 0.2 - 1.0 mg/dL   Nitrite, UA Positive (A) Negative   Microscopic Examination See below:   BLADDER SCAN AMB NON-IMAGING  Result Value Ref Range   Scan Result 1ml    Pertinent Imaging: PVR 55 cc  Assessment & Plan:    1. OAB (overactive  bladder) Failed multiple anticholinergics and beta 3 agonist secondary to allergies and minimal effect We discussed options for refractory symptoms including posterior nerve stimulation and Botox Risk and benefits of each were discussed as well as efficacies She was given handouts on each She may be interested in Botox, but if she pursues this, she would need to do CIC teaching to ensure that she is able to self cath in the unlikelihood 5% risk that she would develop urinary retention especially given her history of rheumatoid arthritis She will let us know how she like to proceed after discussing it further with her son Behavior modification was also discussed - Urinalysis, Complete - BLADDER SCAN AMB NON-IMAGING - CULTURE, URINE COMPREHENSIVE  2. Urge incontinence Same as above  3. Dysuria Secondary to UTI, see below - CULTURE, URINE COMPREHENSIVE  4. Acute cystitis without hematuria Start Bactrim today given that she is symptomatic will adjust as needed based on culture and sensitivity data  Return if symptoms worsen or fail to improve.  Hollice Espy, MD  [facility] [address] [place], [place] [address] [phone]

## 2017-11-01 ENCOUNTER — Telehealth: Payer: Self-pay | Admitting: Urology

## 2017-11-01 NOTE — Telephone Encounter (Signed)
Spoke with patient and she states she took the Bactrim not baclofen and now has hives. She was told to stop the medication and take benadryl. If her symptoms worsen or she has trouble breathing she was instructed to go to the ER for further evaluation.  Should patient be started on a different abx at this time or wait for hives to go away? Please advise thanks

## 2017-11-01 NOTE — Telephone Encounter (Signed)
Pt lmom stating that she started taking baclofen yesterday, 1st pill taken at 4pm, pt states she now has hives and would like to speak to someone. Please advise. Thank you.

## 2017-11-01 NOTE — Telephone Encounter (Signed)
She is allergic to a lot of other things.  Lets wait for the urine culture to come back before we start something else.  Hollice Espy, MD

## 2017-11-02 ENCOUNTER — Encounter: Payer: Self-pay | Admitting: Emergency Medicine

## 2017-11-02 ENCOUNTER — Emergency Department
Admission: EM | Admit: 2017-11-02 | Discharge: 2017-11-02 | Disposition: A | Discharge status: Home / Self Care | Payer: Medicare Other | Source: Home / Self Care | Attending: Emergency Medicine | Admitting: Emergency Medicine

## 2017-11-02 ENCOUNTER — Other Ambulatory Visit: Payer: Self-pay

## 2017-11-02 DIAGNOSIS — I4891 Unspecified atrial fibrillation: Secondary | ICD-10-CM

## 2017-11-02 DIAGNOSIS — T7840XA Allergy, unspecified, initial encounter: Secondary | ICD-10-CM | POA: Insufficient documentation

## 2017-11-02 DIAGNOSIS — T368X5A Adverse effect of other systemic antibiotics, initial encounter: Secondary | ICD-10-CM | POA: Diagnosis not present

## 2017-11-02 DIAGNOSIS — E039 Hypothyroidism, unspecified: Secondary | ICD-10-CM | POA: Insufficient documentation

## 2017-11-02 DIAGNOSIS — N390 Urinary tract infection, site not specified: Secondary | ICD-10-CM | POA: Diagnosis not present

## 2017-11-02 DIAGNOSIS — E7800 Pure hypercholesterolemia, unspecified: Secondary | ICD-10-CM

## 2017-11-02 DIAGNOSIS — M069 Rheumatoid arthritis, unspecified: Secondary | ICD-10-CM | POA: Diagnosis not present

## 2017-11-02 DIAGNOSIS — J9801 Acute bronchospasm: Secondary | ICD-10-CM | POA: Insufficient documentation

## 2017-11-02 DIAGNOSIS — R9431 Abnormal electrocardiogram [ECG] [EKG]: Secondary | ICD-10-CM | POA: Diagnosis not present

## 2017-11-02 DIAGNOSIS — Z7901 Long term (current) use of anticoagulants: Secondary | ICD-10-CM | POA: Insufficient documentation

## 2017-11-02 DIAGNOSIS — Z79899 Other long term (current) drug therapy: Secondary | ICD-10-CM | POA: Insufficient documentation

## 2017-11-02 DIAGNOSIS — J9601 Acute respiratory failure with hypoxia: Secondary | ICD-10-CM | POA: Diagnosis not present

## 2017-11-02 DIAGNOSIS — R21 Rash and other nonspecific skin eruption: Secondary | ICD-10-CM | POA: Diagnosis not present

## 2017-11-02 DIAGNOSIS — J189 Pneumonia, unspecified organism: Secondary | ICD-10-CM | POA: Diagnosis not present

## 2017-11-02 DIAGNOSIS — R590 Localized enlarged lymph nodes: Secondary | ICD-10-CM | POA: Diagnosis not present

## 2017-11-02 DIAGNOSIS — R0602 Shortness of breath: Secondary | ICD-10-CM | POA: Diagnosis not present

## 2017-11-02 DIAGNOSIS — I10 Essential (primary) hypertension: Secondary | ICD-10-CM | POA: Insufficient documentation

## 2017-11-02 DIAGNOSIS — R7989 Other specified abnormal findings of blood chemistry: Secondary | ICD-10-CM | POA: Diagnosis not present

## 2017-11-02 DIAGNOSIS — R0902 Hypoxemia: Secondary | ICD-10-CM | POA: Diagnosis not present

## 2017-11-02 DIAGNOSIS — R0600 Dyspnea, unspecified: Secondary | ICD-10-CM | POA: Diagnosis not present

## 2017-11-02 LAB — LACTIC ACID, PLASMA: Lactic Acid, Venous: 0.9 mmol/L (ref 0.5–1.9)

## 2017-11-02 MED ORDER — FAMOTIDINE IN NACL 20-0.9 MG/50ML-% IV SOLN
20.0000 mg | Freq: Once | INTRAVENOUS | Status: AC
  Administered 2017-11-02: 20 mg via INTRAVENOUS
  Filled 2017-11-02: qty 50

## 2017-11-02 MED ORDER — CEPHALEXIN 500 MG PO CAPS: 500.0000 mg | ORAL_CAPSULE | Freq: Three times a day (TID) | ORAL | 0 refills | Status: DC

## 2017-11-02 MED ORDER — CEPHALEXIN 500 MG PO CAPS
500.0000 mg | ORAL_CAPSULE | Freq: Once | ORAL | Status: AC
  Administered 2017-11-02: 500 mg via ORAL
  Filled 2017-11-02: qty 1

## 2017-11-02 MED ORDER — PROPRANOLOL HCL 20 MG PO TABS
80.0000 mg | ORAL_TABLET | Freq: Once | ORAL | Status: AC
  Administered 2017-11-02: 80 mg via ORAL
  Filled 2017-11-02: qty 4

## 2017-11-02 MED ORDER — EPINEPHRINE 0.3 MG/0.3ML IJ SOAJ: 0.3000 mg | Freq: Once | INTRAMUSCULAR | 1 refills | Status: AC

## 2017-11-02 MED ORDER — ALBUTEROL SULFATE (2.5 MG/3ML) 0.083% IN NEBU
5.0000 mg | INHALATION_SOLUTION | Freq: Once | RESPIRATORY_TRACT | Status: AC
  Administered 2017-11-02: 5 mg via RESPIRATORY_TRACT
  Filled 2017-11-02: qty 6

## 2017-11-02 MED ORDER — DIPHENHYDRAMINE HCL 50 MG/ML IJ SOLN
25.0000 mg | Freq: Once | INTRAMUSCULAR | Status: AC
  Administered 2017-11-02: 25 mg via INTRAVENOUS
  Filled 2017-11-02: qty 1

## 2017-11-02 MED ORDER — METHYLPREDNISOLONE SODIUM SUCC 125 MG IJ SOLR
125.0000 mg | Freq: Once | INTRAMUSCULAR | Status: AC
  Administered 2017-11-02: 125 mg via INTRAVENOUS
  Filled 2017-11-02: qty 2

## 2017-11-02 MED ORDER — ALBUTEROL SULFATE HFA 108 (90 BASE) MCG/ACT IN AERS: 2.0000 | INHALATION_SPRAY | Freq: Four times a day (QID) | RESPIRATORY_TRACT | 0 refills | Status: DC | PRN

## 2017-11-02 MED ORDER — PREDNISONE 20 MG PO TABS: 60.0000 mg | ORAL_TABLET | Freq: Every day | ORAL | 0 refills | Status: DC

## 2017-11-02 NOTE — ED Notes (Signed)
Pt is Glenda Williams, this is new for her. Son states Glenda Williams started today.

## 2017-11-02 NOTE — ED Provider Notes (Signed)
[facility] Emergency Department Provider Note  ____________________________________________  Time seen: Approximately 11:02 AM  I have reviewed the triage vital signs and the nursing notes.   HISTORY  Chief Complaint Urticaria and Allergic Reaction   HPI Glenda Williams is a 77 y.o. female with a history of A. fib on Xarelto, RA, tremors, hypertension who presents for evaluation of an allergic reaction.  Patient reports that she was started on Bactrim 2 days ago for a UTI.  She took 1 dose Wednesday night.  On Thursday she woke up covered in hives.  The hives have been getting progressively worse over the last 2 days.  Since yesterday evening she has had difficulty breathing.  She reports shortness of breath which is worse with ambulation.  She denies history of asthma or COPD, no prior history of smoking, no chest pain.  Patient endorses compliance with her Xarelto.  She has not taken her medications this morning but she takes Xarelto at nighttime.  No fever or chills, no abdominal pain, no nausea or vomiting, no flank pain.  Patient has not been started on any other antibiotics for her UTI per her PCP yet.  Patient has had mild nausea but no vomiting, no diarrhea, no tongue swelling, no angioedema, no sensation of throat closing, no stridor.  Past Medical History:  Diagnosis Date  . A-fib ([place])   . Arthritis   . GERD (gastroesophageal reflux disease)   . Heart disease   . Rheumatoid arthritis ([place])   . Thyroid disease   . Tremors of nervous system     Patient Active Problem List   Diagnosis Date Noted  . Encounter for screening colonoscopy 06/23/2016  . Hypertension 04/26/2015  . Dry mouth 04/26/2015  . Bell palsy 09/04/2014  . Obesity 09/04/2014  . Allergic rhinitis 08/04/2014  . Anxiety 08/04/2014  . Cannot sleep 08/04/2014  . Neuropathic pain 08/04/2014  . Atrial fibrillation ([place]) 08/04/2014  . Avitaminosis D 08/04/2014  . Gastroesophageal  reflux disease without esophagitis 11/07/2013  . Polypharmacy 08/19/2013  . Osteoarthritis 06/11/2013  . Rheumatoid arthritis ([place]) 06/11/2013  . Hypercholesteremia 05/12/2009  . Benign essential tremor 05/12/2009  . Elevated blood sugar 08/26/2008  . Adult hypothyroidism 08/26/2008  . Menopausal symptom 08/26/2008    Past Surgical History:  Procedure Laterality Date  . BREAST BIOPSY Left 1988   Benign  . BREAST BIOPSY Left 1987   Benign  . BROW LIFT Bilateral 05/23/2016   Procedure: BLEPHAROPLASTY upper eyelid; w/excess skin;  Surgeon: Karle Starch, MD;  Location: [place];  Service: Ophthalmology;  Laterality: Bilateral;  . CARPAL TUNNEL RELEASE Bilateral   . CHOLECYSTECTOMY     Dr. Bary Castilla  . COLONOSCOPY  2006  . JOINT REPLACEMENT Bilateral    knees  . REPLACEMENT TOTAL KNEE Left 2005  . REPLACEMENT TOTAL KNEE Right 1998    Prior to Admission medications   Medication Sig Start Date End Date Taking? Authorizing Provider  albuterol (PROVENTIL HFA;VENTOLIN HFA) 108 (90 Base) MCG/ACT inhaler Inhale 2 puffs into the lungs every 6 (six) hours as needed for wheezing or shortness of breath. 11/02/17   Alfred Levins, [place], MD  cephALEXin (KEFLEX) 500 MG capsule Take 1 capsule (500 mg total) by mouth 3 (three) times daily for 7 days. 11/02/17 11/09/17  Rudene Re, MD  cholecalciferol (VITAMIN D) 400 units TABS tablet Take 400 Units by mouth daily.    [provider]  EPINEPHrine 0.3 mg/0.3 mL IJ SOAJ injection Inject 0.3  mLs (0.3 mg total) into the muscle once for 1 dose. 11/02/17 11/02/17  Rudene Re, MD  folic acid (FOLVITE) 1 MG tablet Take 1 mg by mouth daily.    [provider]  hydrochlorothiazide (HYDRODIURIL) 12.5 MG tablet TAKE 1 TABLET BY MOUTH  DAILY 09/17/17   Carles Collet M, PA-C  InFLIXimab (REMICADE IV) Inject into the vein. Every 6-8 weeks    [provider]  levothyroxine (SYNTHROID, LEVOTHROID) 75 MCG tablet TAKE 1  TABLET BY MOUTH  DAILY 10/29/17   Trinna Post, PA-C  methotrexate (RHEUMATREX) 2.5 MG tablet 8 tabs by mouth weekly 10/25/13   [provider]  Omega-3 Fatty Acids (FISH OIL) 1000 MG CAPS Take 1 capsule by mouth daily.    [provider]  omeprazole (PRILOSEC) 20 MG capsule TAKE 1 CAPSULE BY MOUTH 2  TIMES DAILY BEFORE A MEAL. 05/01/17   Carles Collet M, PA-C  polyethylene glycol powder (GLYCOLAX/MIRALAX) powder 255 grams one bottle for colonoscopy prep Patient not taking: Reported on 07/05/2017 06/22/16   Robert Bellow, MD  potassium chloride SA (K-DUR,KLOR-CON) 20 MEQ tablet Take 1 tablet by mouth  daily 03/26/15   Margarita Rana, MD  predniSONE (DELTASONE) 20 MG tablet Take 3 tablets (60 mg total) by mouth daily for 4 days. 11/02/17 11/06/17  Rudene Re, MD  propranolol (INDERAL) 40 MG tablet Take 40 mg by mouth. 2 tablets in the am, 1 tablet in pm    [provider]  Red Yeast Rice 600 MG CAPS Take 1 capsule by mouth daily.    [provider]  rivaroxaban (XARELTO) 20 MG TABS tablet TAKE 1 TABLET BY MOUTH ONCE DAILY 02/07/16   [provider]  sertraline (ZOLOFT) 50 MG tablet TAKE 3 TABLETS BY MOUTH  DAILY 10/29/17   Trinna Post, PA-C  sulfamethoxazole-trimethoprim (BACTRIM DS,SEPTRA DS) 800-160 MG tablet Take 1 tablet by mouth 2 (two) times daily. 10/31/17   Hollice Espy, MD    Allergies Bactrim [sulfamethoxazole-trimethoprim]; Macrobid [nitrofurantoin macrocrystal]; Mirabegron; and Penicillins  Family History  Problem Relation Age of Onset  . Stroke Mother   . Hypertension Mother   . Heart disease Father   . Arthritis Father   . Parkinson's disease Sister   . Parkinson's disease Brother   . Bladder Cancer Brother   . Breast cancer Neg Hx   . Kidney cancer Neg Hx     Social History Social History   Tobacco Use  . Smoking status: Never Smoker  . Smokeless tobacco: Never Used  Substance Use Topics  . Alcohol use:  No  . Drug use: No    Review of Systems  Constitutional: Negative for fever. Eyes: Negative for visual changes. ENT: Negative for sore throat. Neck: No neck pain  Cardiovascular: Negative for chest pain. Respiratory: Negative for shortness of breath. Gastrointestinal: Negative for abdominal pain, vomiting or diarrhea. Genitourinary: Negative for dysuria. Musculoskeletal: Negative for back pain. Skin: + rash Neurological: Negative for headaches, weakness or numbness. Psych: No SI or HI  ____________________________________________   PHYSICAL EXAM:  VITAL SIGNS: ED Triage Vitals  Enc Vitals Group     BP 11/02/17 1000 121/74     Pulse Rate 11/02/17 1000 82     Resp 11/02/17 1000 18     Temp 11/02/17 1000 98.3 F (36.8 C)     Temp Source 11/02/17 1000 Oral     SpO2 11/02/17 1000 98 %     Weight 11/02/17 1002 223 lb (101.2 kg)  Height 11/02/17 1002 5\' 5"  (1.651 m)     Head Circumference --      Peak Flow --      Pain Score 11/02/17 1001 4     Pain Loc --      Pain Edu? --      Excl. in [place]? --     Constitutional: Alert and oriented. Well appearing and in no apparent distress. HEENT:      Head: Normocephalic and atraumatic.         Eyes: Conjunctivae are normal. Sclera is non-icteric.       Mouth/Throat: Mucous membranes are moist.  Pharynx is clear with no swelling, no stridor, uvula and tongue are normal size, no angioedema      Neck: Supple with no signs of meningismus. Cardiovascular: Irregularly rhythm with normal rate. No murmurs, gallops, or rubs. 2+ symmetrical distal pulses are present in all extremities. No JVD. Respiratory: Increased work of breathing with diminished air movement bilaterally, no wheezing or crackles  Gastrointestinal: Soft, non tender, and non distended with positive bowel sounds. No rebound or guarding. Musculoskeletal: Nontender with normal range of motion in all extremities. No edema, cyanosis, or erythema of extremities. Neurologic:  Normal speech and language. Face is symmetric. Moving all extremities. No gross focal neurologic deficits are appreciated. Skin: Skin is warm, dry and intact. Diffuse blanching papular rash with excoriations involving the torso and b/l UE Psychiatric: Mood and affect are normal. Speech and behavior are normal.  ____________________________________________   LABS (all labs ordered are listed, but only abnormal results are displayed)  Labs Reviewed  CULTURE, BLOOD (ROUTINE X 2)  CULTURE, BLOOD (ROUTINE X 2)  LACTIC ACID, PLASMA   ____________________________________________  EKG  ED ECG REPORT I, Rudene Re, the attending physician, personally viewed and interpreted this ECG.  Atrial fibrillation with occasional PVCs, rate of 77, normal QTC, normal axis, no ST elevations or depressions.  Unchanged from prior. ____________________________________________  RADIOLOGY  none  ____________________________________________   PROCEDURES  Procedure(s) performed: None Procedures Critical Care performed:  None ____________________________________________   INITIAL IMPRESSION / ASSESSMENT AND PLAN / ED COURSE  77 y.o. female with a history of A. fib on Xarelto, RA, tremors, hypertension who presents for evaluation of an allergic reaction.  Patient with diffuse pruritic rash and shortness of breath.  Patient does have increased work of breathing with normal sats, decreased air movement bilaterally.  Presentation concerning for bronchospasms in the setting of an allergic reaction.  Will give 5 mg of albuterol, Pepcid, Solu-Medrol, and Benadryl.  No signs of impending airway or anaphylaxis at this time.  Patient has not taken her propranolol this morning therefore we will give here in the emergency room to prevent patient from developing RVR in the setting of albuterol.  Clinical Course as of Nov 03 1302  Fri Nov 02, 2017  1259 She feels markedly improved after albuterol, now with  normal work of breathing and moving good air.  She remains with no signs of anaphylaxis.  She is going to be discharged home with an albuterol inhaler, prednisone, and an EpiPen.  I will also provide her with a prescription for Keflex to treat her urinary tract infection.  Discussed the importance of staying away from all sulfa drugs from now on due to allergic reaction.  Discussed signs and symptoms of anaphylaxis and recommended EpiPen and return to the emergency room if these develop   [CV]    Clinical Course User Index [CV] Rudene Re, MD  As part of my medical decision making, I reviewed the following data within the [facility] notes reviewed and incorporated, Labs reviewed , EKG interpreted , Old chart reviewed, Notes from prior ED visits and [place] Controlled Substance Database    Pertinent labs & imaging results that were available during my care of the patient were reviewed by me and considered in my medical decision making (see chart for details).    ____________________________________________   FINAL CLINICAL IMPRESSION(S) / ED DIAGNOSES  Final diagnoses:  Allergic reaction, initial encounter  Bronchospasm      NEW MEDICATIONS STARTED DURING THIS VISIT:  ED Discharge Orders         Ordered    predniSONE (DELTASONE) 20 MG tablet  Daily     11/02/17 1301    albuterol (PROVENTIL HFA;VENTOLIN HFA) 108 (90 Base) MCG/ACT inhaler  Every 6 hours PRN     11/02/17 1301    cephALEXin (KEFLEX) 500 MG capsule  3 times daily     11/02/17 1301    EPINEPHrine 0.3 mg/0.3 mL IJ SOAJ injection   Once     11/02/17 1301           Note:  This document was prepared using Dragon voice recognition software and may include unintentional dictation errors.    Alfred Levins, [place], MD 11/02/17 [phone]

## 2017-11-02 NOTE — ED Notes (Signed)
Lab called to report that patient's second set of BC were unlabelled.  Patient to Rm 1, Ally RN aware of need for repeat of second set of cultures and placement in room.

## 2017-11-02 NOTE — ED Triage Notes (Signed)
Wednesday pt started with hives after taking antibiotic being treated for a uti. Worsening hives over the past 2 days, appears in NAD, pt only took one pill. Pt did take one benadryl last pm and one this am with a little relief.

## 2017-11-02 NOTE — Discharge Instructions (Addendum)
Use 2 puffs of albuterol every 4 hours as needed for shortness of breath.  Take 60 mg of prednisone once a day for the next 4 days.  Take Benadryl 50 mg every 6-8 hours as needed for itching.  If you develop swelling of your tongue or lips, difficulty breathing, feels like your throat is closing please administer the epipen and call 911. You should not take any sulfa-based drug as it could cause similar reaction.  Take Keflex instead 3 times a day for 7 days for urinary tract infection.

## 2017-11-02 NOTE — ED Notes (Addendum)
Pt is being treated for UTI,  allergic reaction to bactrim that she took Wednesday morning. Broke out in hives on her belly back legs and chest. SOB starting yesterday at rest. Pt took benadryl with moderate relief wednesday. Pt took benadryl this AM around 0730

## 2017-11-02 NOTE — Telephone Encounter (Signed)
Pt called this morning stating that she noticed her breathing becoming more labored than normal. I instructed pt due to the reaction from the abx she needs to proceed to the emergency room. Pt states her understanding. She asked if urgent care was okay, I informed her she needed to go to ED.

## 2017-11-02 NOTE — ED Notes (Signed)
ED Provider at bedside. 

## 2017-11-02 NOTE — ED Notes (Signed)
First culture drawn via IV; second set drawn in left ac straight stick.

## 2017-11-03 LAB — CULTURE, URINE COMPREHENSIVE

## 2017-11-04 ENCOUNTER — Emergency Department: Payer: Medicare Other

## 2017-11-04 ENCOUNTER — Inpatient Hospital Stay
Admission: EM | Admit: 2017-11-04 | Discharge: 2017-11-08 | DRG: 189 | Disposition: A | Discharge status: Home / Self Care | Payer: Medicare Other | Attending: Internal Medicine | Admitting: Internal Medicine

## 2017-11-04 ENCOUNTER — Other Ambulatory Visit: Payer: Self-pay

## 2017-11-04 ENCOUNTER — Inpatient Hospital Stay
Admit: 2017-11-04 | Discharge: 2017-11-04 | Disposition: A | Discharge status: Home / Self Care | Payer: Medicare Other | Attending: Internal Medicine | Admitting: Internal Medicine

## 2017-11-04 ENCOUNTER — Emergency Department (HOSPITAL_COMMUNITY): Payer: Medicare Other

## 2017-11-04 DIAGNOSIS — Z888 Allergy status to other drugs, medicaments and biological substances status: Secondary | ICD-10-CM

## 2017-11-04 DIAGNOSIS — Z823 Family history of stroke: Secondary | ICD-10-CM

## 2017-11-04 DIAGNOSIS — Z8249 Family history of ischemic heart disease and other diseases of the circulatory system: Secondary | ICD-10-CM

## 2017-11-04 DIAGNOSIS — J9690 Respiratory failure, unspecified, unspecified whether with hypoxia or hypercapnia: Secondary | ICD-10-CM | POA: Diagnosis present

## 2017-11-04 DIAGNOSIS — K219 Gastro-esophageal reflux disease without esophagitis: Secondary | ICD-10-CM | POA: Diagnosis present

## 2017-11-04 DIAGNOSIS — Z7901 Long term (current) use of anticoagulants: Secondary | ICD-10-CM

## 2017-11-04 DIAGNOSIS — R7989 Other specified abnormal findings of blood chemistry: Secondary | ICD-10-CM | POA: Diagnosis not present

## 2017-11-04 DIAGNOSIS — Z9049 Acquired absence of other specified parts of digestive tract: Secondary | ICD-10-CM | POA: Diagnosis not present

## 2017-11-04 DIAGNOSIS — R0602 Shortness of breath: Secondary | ICD-10-CM | POA: Diagnosis not present

## 2017-11-04 DIAGNOSIS — E039 Hypothyroidism, unspecified: Secondary | ICD-10-CM | POA: Diagnosis present

## 2017-11-04 DIAGNOSIS — Z881 Allergy status to other antibiotic agents status: Secondary | ICD-10-CM

## 2017-11-04 DIAGNOSIS — Z8052 Family history of malignant neoplasm of bladder: Secondary | ICD-10-CM

## 2017-11-04 DIAGNOSIS — R21 Rash and other nonspecific skin eruption: Secondary | ICD-10-CM

## 2017-11-04 DIAGNOSIS — R0902 Hypoxemia: Secondary | ICD-10-CM | POA: Diagnosis not present

## 2017-11-04 DIAGNOSIS — I2510 Atherosclerotic heart disease of native coronary artery without angina pectoris: Secondary | ICD-10-CM | POA: Diagnosis present

## 2017-11-04 DIAGNOSIS — Z79890 Hormone replacement therapy (postmenopausal): Secondary | ICD-10-CM

## 2017-11-04 DIAGNOSIS — J189 Pneumonia, unspecified organism: Secondary | ICD-10-CM | POA: Diagnosis present

## 2017-11-04 DIAGNOSIS — I4891 Unspecified atrial fibrillation: Secondary | ICD-10-CM | POA: Diagnosis not present

## 2017-11-04 DIAGNOSIS — Z882 Allergy status to sulfonamides status: Secondary | ICD-10-CM | POA: Diagnosis not present

## 2017-11-04 DIAGNOSIS — E669 Obesity, unspecified: Secondary | ICD-10-CM | POA: Diagnosis present

## 2017-11-04 DIAGNOSIS — M1990 Unspecified osteoarthritis, unspecified site: Secondary | ICD-10-CM | POA: Diagnosis present

## 2017-11-04 DIAGNOSIS — R9431 Abnormal electrocardiogram [ECG] [EKG]: Secondary | ICD-10-CM

## 2017-11-04 DIAGNOSIS — R590 Localized enlarged lymph nodes: Secondary | ICD-10-CM | POA: Diagnosis present

## 2017-11-04 DIAGNOSIS — X58XXXA Exposure to other specified factors, initial encounter: Secondary | ICD-10-CM | POA: Diagnosis present

## 2017-11-04 DIAGNOSIS — E782 Mixed hyperlipidemia: Secondary | ICD-10-CM | POA: Diagnosis present

## 2017-11-04 DIAGNOSIS — Z8261 Family history of arthritis: Secondary | ICD-10-CM

## 2017-11-04 DIAGNOSIS — Z6838 Body mass index (BMI) 38.0-38.9, adult: Secondary | ICD-10-CM

## 2017-11-04 DIAGNOSIS — Z820 Family history of epilepsy and other diseases of the nervous system: Secondary | ICD-10-CM

## 2017-11-04 DIAGNOSIS — E7800 Pure hypercholesterolemia, unspecified: Secondary | ICD-10-CM | POA: Diagnosis present

## 2017-11-04 DIAGNOSIS — M069 Rheumatoid arthritis, unspecified: Secondary | ICD-10-CM | POA: Diagnosis present

## 2017-11-04 DIAGNOSIS — I482 Chronic atrial fibrillation: Secondary | ICD-10-CM | POA: Diagnosis present

## 2017-11-04 DIAGNOSIS — I495 Sick sinus syndrome: Secondary | ICD-10-CM | POA: Diagnosis present

## 2017-11-04 DIAGNOSIS — N390 Urinary tract infection, site not specified: Secondary | ICD-10-CM | POA: Diagnosis present

## 2017-11-04 DIAGNOSIS — T370X5A Adverse effect of sulfonamides, initial encounter: Secondary | ICD-10-CM | POA: Diagnosis present

## 2017-11-04 DIAGNOSIS — Z79899 Other long term (current) drug therapy: Secondary | ICD-10-CM

## 2017-11-04 DIAGNOSIS — Z96653 Presence of artificial knee joint, bilateral: Secondary | ICD-10-CM | POA: Diagnosis present

## 2017-11-04 DIAGNOSIS — T368X5A Adverse effect of other systemic antibiotics, initial encounter: Secondary | ICD-10-CM | POA: Diagnosis present

## 2017-11-04 DIAGNOSIS — J9601 Acute respiratory failure with hypoxia: Secondary | ICD-10-CM | POA: Diagnosis present

## 2017-11-04 DIAGNOSIS — L270 Generalized skin eruption due to drugs and medicaments taken internally: Secondary | ICD-10-CM | POA: Diagnosis present

## 2017-11-04 DIAGNOSIS — R778 Other specified abnormalities of plasma proteins: Secondary | ICD-10-CM

## 2017-11-04 DIAGNOSIS — T7840XA Allergy, unspecified, initial encounter: Secondary | ICD-10-CM | POA: Diagnosis not present

## 2017-11-04 DIAGNOSIS — D72829 Elevated white blood cell count, unspecified: Secondary | ICD-10-CM | POA: Diagnosis present

## 2017-11-04 DIAGNOSIS — I480 Paroxysmal atrial fibrillation: Secondary | ICD-10-CM | POA: Diagnosis not present

## 2017-11-04 DIAGNOSIS — I10 Essential (primary) hypertension: Secondary | ICD-10-CM | POA: Diagnosis present

## 2017-11-04 DIAGNOSIS — R0600 Dyspnea, unspecified: Secondary | ICD-10-CM | POA: Diagnosis not present

## 2017-11-04 DIAGNOSIS — Z880 Allergy status to penicillin: Secondary | ICD-10-CM

## 2017-11-04 LAB — BLOOD GAS, VENOUS
Acid-Base Excess: 2 mmol/L (ref 0.0–2.0)
BICARBONATE: 32 mmol/L — AB (ref 20.0–28.0)
FIO2: 0.21
O2 Saturation: 56.2 %
PATIENT TEMPERATURE: 37
PH VEN: 7.25 (ref 7.250–7.430)
pCO2, Ven: 73 mmHg (ref 44.0–60.0)
pO2, Ven: 35 mmHg (ref 32.0–45.0)

## 2017-11-04 LAB — CBC WITH DIFFERENTIAL/PLATELET
BASOS ABS: 0.1 10*3/uL (ref 0–0.1)
BASOS PCT: 0 %
EOS PCT: 2 %
Eosinophils Absolute: 0.5 10*3/uL (ref 0–0.7)
HCT: 49 % — ABNORMAL HIGH (ref 35.0–47.0)
Hemoglobin: 16.2 g/dL — ABNORMAL HIGH (ref 12.0–16.0)
Lymphocytes Relative: 6 %
Lymphs Abs: 1.7 10*3/uL (ref 1.0–3.6)
MCH: 30.9 pg (ref 26.0–34.0)
MCHC: 33.1 g/dL (ref 32.0–36.0)
MCV: 93.3 fL (ref 80.0–100.0)
MONO ABS: 1.7 10*3/uL — AB (ref 0.2–0.9)
MONOS PCT: 5 %
Neutro Abs: 26.4 10*3/uL — ABNORMAL HIGH (ref 1.4–6.5)
Neutrophils Relative %: 87 %
PLATELETS: 278 10*3/uL (ref 150–440)
RBC: 5.25 MIL/uL — ABNORMAL HIGH (ref 3.80–5.20)
RDW: 15 % — ABNORMAL HIGH (ref 11.5–14.5)
WBC: 30.4 10*3/uL — ABNORMAL HIGH (ref 3.6–11.0)

## 2017-11-04 LAB — URINALYSIS, COMPLETE (UACMP) WITH MICROSCOPIC
BILIRUBIN URINE: NEGATIVE
Bacteria, UA: NONE SEEN
Glucose, UA: NEGATIVE mg/dL
Ketones, ur: NEGATIVE mg/dL
LEUKOCYTES UA: NEGATIVE
Nitrite: NEGATIVE
PH: 5 (ref 5.0–8.0)
Protein, ur: 30 mg/dL — AB
SPECIFIC GRAVITY, URINE: 1.04 — AB (ref 1.005–1.030)

## 2017-11-04 LAB — COMPREHENSIVE METABOLIC PANEL
ALBUMIN: 3.4 g/dL — AB (ref 3.5–5.0)
ALT: 24 U/L (ref 0–44)
AST: 30 U/L (ref 15–41)
Alkaline Phosphatase: 65 U/L (ref 38–126)
Anion gap: 9 (ref 5–15)
BILIRUBIN TOTAL: 1.3 mg/dL — AB (ref 0.3–1.2)
BUN: 15 mg/dL (ref 8–23)
CO2: 30 mmol/L (ref 22–32)
Calcium: 8.8 mg/dL — ABNORMAL LOW (ref 8.9–10.3)
Chloride: 97 mmol/L — ABNORMAL LOW (ref 98–111)
Creatinine, Ser: 0.78 mg/dL (ref 0.44–1.00)
GFR calc Af Amer: >60 mL/min (ref >60)
Glucose, Bld: 184 mg/dL — ABNORMAL HIGH (ref 70–99)
Potassium: 4.2 mmol/L (ref 3.5–5.1)
Sodium: 136 mmol/L (ref 135–145)
TOTAL PROTEIN: 7.9 g/dL (ref 6.5–8.1)

## 2017-11-04 LAB — TSH: TSH: 6.104 u[IU]/mL — ABNORMAL HIGH (ref 0.350–4.500)

## 2017-11-04 LAB — BLOOD GAS, ARTERIAL
ALLENS TEST (PASS/FAIL): POSITIVE — AB
Acid-Base Excess: 5.2 mmol/L — ABNORMAL HIGH (ref 0.0–2.0)
Bicarbonate: 30.5 mmol/L — ABNORMAL HIGH (ref 20.0–28.0)
Delivery systems: POSITIVE
Expiratory PAP: 8
FIO2: 0.3
INSPIRATORY PAP: 12
O2 SAT: 92.4 %
PO2 ART: 63 mmHg — AB (ref 83.0–108.0)
Patient temperature: 37
RATE: 8 resp/min
pCO2 arterial: 46 mmHg (ref 32.0–48.0)
pH, Arterial: 7.43 (ref 7.350–7.450)

## 2017-11-04 LAB — PROCALCITONIN

## 2017-11-04 LAB — FIBRIN DERIVATIVES D-DIMER (ARMC ONLY): Fibrin derivatives D-dimer (ARMC): 1430.45 ng/mL (FEU) — ABNORMAL HIGH (ref 0.00–499.00)

## 2017-11-04 LAB — MRSA PCR SCREENING: MRSA by PCR: NEGATIVE

## 2017-11-04 LAB — BRAIN NATRIURETIC PEPTIDE: B NATRIURETIC PEPTIDE 5: 274 pg/mL — AB (ref 0.0–100.0)

## 2017-11-04 LAB — LACTIC ACID, PLASMA: Lactic Acid, Venous: 1.5 mmol/L (ref 0.5–1.9)

## 2017-11-04 LAB — GLUCOSE, CAPILLARY: Glucose-Capillary: 96 mg/dL (ref 70–99)

## 2017-11-04 LAB — TROPONIN I
TROPONIN I: 0.03 ng/mL — AB (ref <0.03)
TROPONIN I: 0.07 ng/mL — AB (ref <0.03)

## 2017-11-04 MED ORDER — FUROSEMIDE 10 MG/ML IJ SOLN: 20.0000 mg | Freq: Once | INTRAMUSCULAR | Status: DC

## 2017-11-04 MED ORDER — DIPHENHYDRAMINE HCL 25 MG PO CAPS
25.0000 mg | ORAL_CAPSULE | Freq: Three times a day (TID) | ORAL | Status: DC | PRN
  Administered 2017-11-04 – 2017-11-07 (×5): 25 mg via ORAL
  Filled 2017-11-04 (×6): qty 1

## 2017-11-04 MED ORDER — CHLORHEXIDINE GLUCONATE 0.12 % MT SOLN
15.0000 mL | Freq: Two times a day (BID) | OROMUCOSAL | Status: DC
  Administered 2017-11-04 – 2017-11-08 (×9): 15 mL via OROMUCOSAL
  Filled 2017-11-04 (×9): qty 15

## 2017-11-04 MED ORDER — SODIUM CHLORIDE 0.9 % IV SOLN: 250.0000 mL | INTRAVENOUS | Status: DC | PRN

## 2017-11-04 MED ORDER — HYDROCHLOROTHIAZIDE 25 MG PO TABS
12.5000 mg | ORAL_TABLET | Freq: Every day | ORAL | Status: DC
  Administered 2017-11-04 – 2017-11-08 (×5): 12.5 mg via ORAL
  Filled 2017-11-04 (×5): qty 1

## 2017-11-04 MED ORDER — ORAL CARE MOUTH RINSE
15.0000 mL | Freq: Two times a day (BID) | OROMUCOSAL | Status: DC
  Administered 2017-11-04 – 2017-11-07 (×5): 15 mL via OROMUCOSAL

## 2017-11-04 MED ORDER — METHYLPREDNISOLONE SODIUM SUCC 125 MG IJ SOLR
125.0000 mg | INTRAMUSCULAR | Status: AC
  Administered 2017-11-04: 125 mg via INTRAVENOUS
  Filled 2017-11-04: qty 2

## 2017-11-04 MED ORDER — ONDANSETRON HCL 4 MG/2ML IJ SOLN: 4.0000 mg | Freq: Four times a day (QID) | INTRAMUSCULAR | Status: DC | PRN

## 2017-11-04 MED ORDER — LEVOFLOXACIN IN D5W 750 MG/150ML IV SOLN
750.0000 mg | INTRAVENOUS | Status: DC
  Filled 2017-11-04: qty 150

## 2017-11-04 MED ORDER — RIVAROXABAN 20 MG PO TABS
20.0000 mg | ORAL_TABLET | Freq: Every day | ORAL | Status: DC
  Administered 2017-11-04 – 2017-11-07 (×4): 20 mg via ORAL
  Filled 2017-11-04 (×5): qty 1

## 2017-11-04 MED ORDER — DIPHENHYDRAMINE HCL 50 MG/ML IJ SOLN
25.0000 mg | Freq: Once | INTRAMUSCULAR | Status: AC
  Administered 2017-11-04: 25 mg via INTRAVENOUS
  Filled 2017-11-04: qty 1

## 2017-11-04 MED ORDER — ADULT MULTIVITAMIN W/MINERALS CH
1.0000 | ORAL_TABLET | Freq: Every day | ORAL | Status: DC
  Administered 2017-11-04 – 2017-11-08 (×5): 1 via ORAL
  Filled 2017-11-04 (×5): qty 1

## 2017-11-04 MED ORDER — PROPRANOLOL HCL 40 MG PO TABS
80.0000 mg | ORAL_TABLET | Freq: Every morning | ORAL | Status: DC
  Administered 2017-11-05 – 2017-11-07 (×3): 80 mg via ORAL
  Filled 2017-11-04 (×2): qty 2
  Filled 2017-11-04: qty 4

## 2017-11-04 MED ORDER — SODIUM CHLORIDE 0.9% FLUSH: 3.0000 mL | INTRAVENOUS | Status: DC | PRN

## 2017-11-04 MED ORDER — SODIUM CHLORIDE 0.9 % IV SOLN
2.0000 g | Freq: Two times a day (BID) | INTRAVENOUS | Status: DC
  Administered 2017-11-04 – 2017-11-06 (×5): 2 g via INTRAVENOUS
  Filled 2017-11-04 (×7): qty 2

## 2017-11-04 MED ORDER — BUMETANIDE 1 MG PO TABS
1.0000 mg | ORAL_TABLET | Freq: Once | ORAL | Status: AC
  Administered 2017-11-04: 1 mg via ORAL
  Filled 2017-11-04: qty 1

## 2017-11-04 MED ORDER — PROPRANOLOL HCL 20 MG PO TABS: 40.0000 mg | ORAL_TABLET | Freq: Two times a day (BID) | ORAL | Status: DC

## 2017-11-04 MED ORDER — IPRATROPIUM-ALBUTEROL 0.5-2.5 (3) MG/3ML IN SOLN
3.0000 mL | Freq: Four times a day (QID) | RESPIRATORY_TRACT | Status: DC
  Administered 2017-11-04 – 2017-11-06 (×8): 3 mL via RESPIRATORY_TRACT
  Filled 2017-11-04 (×8): qty 3

## 2017-11-04 MED ORDER — ONDANSETRON HCL 4 MG PO TABS: 4.0000 mg | ORAL_TABLET | Freq: Four times a day (QID) | ORAL | Status: DC | PRN

## 2017-11-04 MED ORDER — METHYLPREDNISOLONE SODIUM SUCC 125 MG IJ SOLR: 60.0000 mg | Freq: Four times a day (QID) | INTRAMUSCULAR | Status: DC

## 2017-11-04 MED ORDER — IPRATROPIUM-ALBUTEROL 0.5-2.5 (3) MG/3ML IN SOLN
3.0000 mL | Freq: Once | RESPIRATORY_TRACT | Status: AC
  Administered 2017-11-04: 3 mL via RESPIRATORY_TRACT

## 2017-11-04 MED ORDER — SODIUM CHLORIDE 0.9% FLUSH
3.0000 mL | Freq: Two times a day (BID) | INTRAVENOUS | Status: DC
  Administered 2017-11-04 – 2017-11-08 (×8): 3 mL via INTRAVENOUS

## 2017-11-04 MED ORDER — FAMOTIDINE IN NACL 20-0.9 MG/50ML-% IV SOLN
20.0000 mg | Freq: Two times a day (BID) | INTRAVENOUS | Status: DC
  Administered 2017-11-04 – 2017-11-05 (×4): 20 mg via INTRAVENOUS
  Filled 2017-11-04 (×5): qty 50

## 2017-11-04 MED ORDER — GUAIFENESIN ER 600 MG PO TB12
600.0000 mg | ORAL_TABLET | Freq: Two times a day (BID) | ORAL | Status: DC
  Administered 2017-11-04: 600 mg via ORAL
  Filled 2017-11-04: qty 1

## 2017-11-04 MED ORDER — FOLIC ACID 1 MG PO TABS
1.0000 mg | ORAL_TABLET | Freq: Every day | ORAL | Status: DC
  Administered 2017-11-05 – 2017-11-08 (×4): 1 mg via ORAL
  Filled 2017-11-04 (×4): qty 1

## 2017-11-04 MED ORDER — IPRATROPIUM-ALBUTEROL 0.5-2.5 (3) MG/3ML IN SOLN
RESPIRATORY_TRACT | Status: AC
  Filled 2017-11-04: qty 3

## 2017-11-04 MED ORDER — GUAIFENESIN ER 600 MG PO TB12
600.0000 mg | ORAL_TABLET | Freq: Two times a day (BID) | ORAL | Status: DC
  Administered 2017-11-05 – 2017-11-08 (×7): 600 mg via ORAL
  Filled 2017-11-04 (×7): qty 1

## 2017-11-04 MED ORDER — METHYLPREDNISOLONE SODIUM SUCC 125 MG IJ SOLR
60.0000 mg | Freq: Three times a day (TID) | INTRAMUSCULAR | Status: DC
  Administered 2017-11-04 – 2017-11-05 (×3): 60 mg via INTRAVENOUS
  Filled 2017-11-04 (×3): qty 2

## 2017-11-04 MED ORDER — BUDESONIDE 0.5 MG/2ML IN SUSP
0.5000 mg | Freq: Two times a day (BID) | RESPIRATORY_TRACT | Status: DC
  Administered 2017-11-05 – 2017-11-08 (×7): 0.5 mg via RESPIRATORY_TRACT
  Filled 2017-11-04 (×9): qty 2

## 2017-11-04 MED ORDER — IOHEXOL 350 MG/ML SOLN
75.0000 mL | Freq: Once | INTRAVENOUS | Status: AC | PRN
  Administered 2017-11-04: 75 mL via INTRAVENOUS

## 2017-11-04 MED ORDER — ACETAMINOPHEN 650 MG RE SUPP: 650.0000 mg | Freq: Four times a day (QID) | RECTAL | Status: DC | PRN

## 2017-11-04 MED ORDER — POTASSIUM CHLORIDE CRYS ER 20 MEQ PO TBCR
20.0000 meq | EXTENDED_RELEASE_TABLET | Freq: Every day | ORAL | Status: DC
  Administered 2017-11-05 – 2017-11-06 (×2): 20 meq via ORAL
  Filled 2017-11-04 (×2): qty 1

## 2017-11-04 MED ORDER — SERTRALINE HCL 50 MG PO TABS
150.0000 mg | ORAL_TABLET | Freq: Every day | ORAL | Status: DC
  Administered 2017-11-05 – 2017-11-08 (×4): 150 mg via ORAL
  Filled 2017-11-04 (×4): qty 3

## 2017-11-04 MED ORDER — LEVOTHYROXINE SODIUM 75 MCG PO TABS
75.0000 ug | ORAL_TABLET | Freq: Every day | ORAL | Status: DC
  Administered 2017-11-05 – 2017-11-07 (×3): 75 ug via ORAL
  Filled 2017-11-04: qty 1
  Filled 2017-11-04: qty 3
  Filled 2017-11-04 (×2): qty 1

## 2017-11-04 MED ORDER — PROPRANOLOL HCL 40 MG PO TABS
40.0000 mg | ORAL_TABLET | Freq: Every day | ORAL | Status: DC
  Administered 2017-11-04 – 2017-11-06 (×3): 40 mg via ORAL
  Filled 2017-11-04 (×2): qty 1
  Filled 2017-11-04 (×2): qty 2
  Filled 2017-11-04: qty 1

## 2017-11-04 MED ORDER — ACETAMINOPHEN 325 MG PO TABS: 650.0000 mg | ORAL_TABLET | Freq: Four times a day (QID) | ORAL | Status: DC | PRN

## 2017-11-04 MED ORDER — POLYETHYLENE GLYCOL 3350 17 G PO PACK: 17.0000 g | PACK | Freq: Every day | ORAL | Status: DC | PRN

## 2017-11-04 NOTE — H&P (Addendum)
[facility] NAME: Glenda Williams    MR#:  [id number]  DATE OF BIRTH:  [birth year]  DATE OF ADMISSION:  11/04/2017  PRIMARY CARE PHYSICIAN: Trinna Post, PA-C   REQUESTING/REFERRING PHYSICIAN:   CHIEF COMPLAINT:   Chief Complaint  Patient presents with  . Shortness of Breath    HISTORY OF PRESENT ILLNESS: Glenda Williams  is a 77 y.o. female with a known history per below who was seen on Thursday for allergic reaction/rash due to Bactrim that was prescribed for UTI, returns today with shortness of breath, per patient/son/husband patient has been short of breath since about Friday-2 days ago, associated with cough, denies fevers/chills/night sweats/orthopnea/leg swelling, they denied any history of underlying lung disease or heart failure, in the emergency room patient was found to have tachypnea, acute hypoxia requiring oxygen via nasal cannula, white count of 30,000, venous blood gas noted for acute respiratory acidosis with pH 7.25/PCO2 73, chest x-ray inconclusive, CT chest to evaluate for possible pulmonary embolism-pending, evaluation in the emergency room noted for patient consistently dropping O2 saturation into the 70s while on oxygen via nasal cannula patient is now being admitted for acute hypoxic respiratory failure secondary to unknown etiology and acute allergic reaction to Bactrim inpatient that is immunocompromised due to rheumatoid arthritis/Remicade use.  PAST MEDICAL HISTORY:   Past Medical History:  Diagnosis Date  . A-fib ([place])   . Arthritis   . GERD (gastroesophageal reflux disease)   . Heart disease   . Rheumatoid arthritis ([place])   . Thyroid disease   . Tremors of nervous system     PAST SURGICAL HISTORY:  Past Surgical History:  Procedure Laterality Date  . BREAST BIOPSY Left 1988   Benign  . BREAST BIOPSY Left 1987   Benign  . BROW LIFT Bilateral 05/23/2016   Procedure: BLEPHAROPLASTY upper eyelid; w/excess  skin;  Surgeon: Karle Starch, MD;  Location: [facility];  Service: Ophthalmology;  Laterality: Bilateral;  . CARPAL TUNNEL RELEASE Bilateral   . CHOLECYSTECTOMY     Dr. Bary Castilla  . COLONOSCOPY  2006  . JOINT REPLACEMENT Bilateral    knees  . REPLACEMENT TOTAL KNEE Left 2005  . REPLACEMENT TOTAL KNEE Right 1998    SOCIAL HISTORY:  Social History   Tobacco Use  . Smoking status: Never Smoker  . Smokeless tobacco: Never Used  Substance Use Topics  . Alcohol use: No    FAMILY HISTORY:  Family History  Problem Relation Age of Onset  . Stroke Mother   . Hypertension Mother   . Heart disease Father   . Arthritis Father   . Parkinson's disease Sister   . Parkinson's disease Brother   . Bladder Cancer Brother   . Breast cancer Neg Hx   . Kidney cancer Neg Hx     DRUG ALLERGIES:  Allergies  Allergen Reactions  . Bactrim [Sulfamethoxazole-Trimethoprim] Rash  . Macrobid [Nitrofurantoin Macrocrystal] Rash  . Mirabegron Rash  . Oxybutynin Rash  . Penicillins Rash    Has patient had a PCN reaction causing immediate rash, facial/tongue/throat swelling, SOB or lightheadedness with hypotension: Yes Has patient had a PCN reaction causing severe rash involving mucus membranes or skin necrosis: No Has patient had a PCN reaction that required hospitalization: No Has patient had a PCN reaction occurring within the last 10 years: Unknown If all of the above answers are "NO", then may proceed with Cephalosporin use.     REVIEW OF  SYSTEMS:   CONSTITUTIONAL: No fever,+fatigue, weakness.  EYES: No blurred or double vision.  EARS, NOSE, AND THROAT: No tinnitus or ear pain.  RESPIRATORY: + cough, shortness of breath, no wheezing or hemoptysis.  CARDIOVASCULAR: No chest pain, orthopnea, edema.  GASTROINTESTINAL: No nausea, vomiting, diarrhea or abdominal pain.  GENITOURINARY: No dysuria, hematuria.  ENDOCRINE: No polyuria, nocturia,  HEMATOLOGY: No anemia, easy bruising or  bleeding SKIN: + rash MUSCULOSKELETAL: No joint pain or arthritis.   NEUROLOGIC: No tingling, numbness, weakness.  PSYCHIATRY: No anxiety or depression.   MEDICATIONS AT HOME:  Prior to Admission medications   Medication Sig Start Date End Date Taking? Authorizing Provider  albuterol (PROVENTIL HFA;VENTOLIN HFA) 108 (90 Base) MCG/ACT inhaler Inhale 2 puffs into the lungs every 6 (six) hours as needed for wheezing or shortness of breath. 11/02/17  Yes Veronese, [place], MD  cephALEXin (KEFLEX) 500 MG capsule Take 1 capsule (500 mg total) by mouth 3 (three) times daily for 7 days. 11/02/17 11/09/17 Yes Veronese, [place], MD  cholecalciferol (VITAMIN D) 400 units TABS tablet Take 400 Units by mouth daily.   Yes [provider]  EPINEPHrine 0.3 mg/0.3 mL IJ SOAJ injection Inject 0.3 mLs into the skin once as needed for anaphylaxis. 11/03/17  Yes [provider]  folic acid (FOLVITE) 1 MG tablet Take 1 mg by mouth daily.   Yes [provider]  hydrochlorothiazide (HYDRODIURIL) 12.5 MG tablet TAKE 1 TABLET BY MOUTH  DAILY 09/17/17  Yes Carles Collet M, PA-C  InFLIXimab (REMICADE IV) Inject into the vein. Every 6-8 weeks   Yes [provider]  levothyroxine (SYNTHROID, LEVOTHROID) 75 MCG tablet TAKE 1 TABLET BY MOUTH  DAILY 10/29/17  Yes Carles Collet M, PA-C  methotrexate (RHEUMATREX) 2.5 MG tablet Take 20 mg by mouth every Thursday.  10/25/13  Yes [provider]  Omega-3 Fatty Acids (FISH OIL) 1000 MG CAPS Take 1 capsule by mouth daily.   Yes [provider]  omeprazole (PRILOSEC) 20 MG capsule TAKE 1 CAPSULE BY MOUTH 2  TIMES DAILY BEFORE A MEAL. Patient taking differently: Take 20 mg by mouth 2 (two) times daily before a meal. TAKE 1 CAPSULE BY MOUTH 2  TIMES DAILY BEFORE A MEAL. 05/01/17  Yes Terrilee Croak, Adriana M, PA-C  potassium chloride SA (K-DUR,KLOR-CON) 20 MEQ tablet Take 1 tablet by mouth  daily 03/26/15  Yes Margarita Rana, MD  predniSONE  (DELTASONE) 20 MG tablet Take 3 tablets (60 mg total) by mouth daily for 4 days. 11/02/17 11/06/17 Yes Veronese, [place], MD  propranolol (INDERAL) 40 MG tablet Take 40-80 mg by mouth 2 (two) times daily. 2 tablets in the am, 1 tablet in pm   Yes [provider]  Red Yeast Rice 600 MG CAPS Take 1 capsule by mouth daily.   Yes [provider]  rivaroxaban (XARELTO) 20 MG TABS tablet TAKE 1 TABLET BY MOUTH ONCE DAILY 02/07/16  Yes [provider]  sertraline (ZOLOFT) 50 MG tablet TAKE 3 TABLETS BY MOUTH  DAILY 10/29/17  Yes Terrilee Croak, Adriana M, PA-C  polyethylene glycol powder (GLYCOLAX/MIRALAX) powder 255 grams one bottle for colonoscopy prep Patient not taking: Reported on 07/05/2017 06/22/16   Robert Bellow, MD  sulfamethoxazole-trimethoprim (BACTRIM DS,SEPTRA DS) 800-160 MG tablet Take 1 tablet by mouth 2 (two) times daily. Patient not taking: Reported on 11/04/2017 10/31/17   Hollice Espy, MD      PHYSICAL EXAMINATION:   VITAL SIGNS: Pulse 84, resp. rate (!) 30, height 5\' 5"  (1.651  m), weight 101.2 kg, SpO2 100 %.  GENERAL:  77 y.o.-year-old patient lying in the bed with mild acute distress.  Morbidly obese, nontoxic-appearing EYES: Pupils equal, round, reactive to light and accommodation. No scleral icterus. Extraocular muscles intact.  HEENT: Head atraumatic, normocephalic. Oropharynx and nasopharynx clear.  NECK:  Supple, no jugular venous distention. No thyroid enlargement, no tenderness.  LUNGS: Severely diminished breath sounds throughout with minimal rails bilaterally.  Moderate use of accessory muscles of respiration.  CARDIOVASCULAR: S1, S2 normal. No murmurs, rubs, or gallops.  ABDOMEN: Soft, nontender, nondistended. Bowel sounds present. No organomegaly or mass.  EXTREMITIES: Bilateral lower extremity edema, no cyanosis, or clubbing.  NEUROLOGIC: Cranial nerves II through XII are intact. MAES. Gait not checked.  PSYCHIATRIC: The patient is alert and  oriented x 3.  SKIN: No obvious rash, lesion, or ulcer.   LABORATORY PANEL:   CBC Recent Labs  Lab 11/04/17 1136  WBC 30.4*  HGB 16.2*  HCT 49.0*  PLT 278  MCV 93.3  MCH 30.9  MCHC 33.1  RDW 15.0*  LYMPHSABS 1.7  MONOABS 1.7*  EOSABS 0.5  BASOSABS 0.1   ------------------------------------------------------------------------------------------------------------------  Chemistries  Recent Labs  Lab 11/04/17 1136  NA 136  K 4.2  CL 97*  CO2 30  GLUCOSE 184*  BUN 15  CREATININE 0.78  CALCIUM 8.8*  AST 30  ALT 24  ALKPHOS 65  BILITOT 1.3*   ------------------------------------------------------------------------------------------------------------------ estimated creatinine clearance is 71.7 mL/min (by C-G formula based on SCr of 0.78 mg/dL). ------------------------------------------------------------------------------------------------------------------ No results for input(s): TSH, T4TOTAL, T3FREE, THYROIDAB in the last 72 hours.  Invalid input(s): FREET3   Coagulation profile No results for input(s): INR, PROTIME in the last 168 hours. ------------------------------------------------------------------------------------------------------------------- No results for input(s): DDIMER in the last 72 hours. -------------------------------------------------------------------------------------------------------------------  Cardiac Enzymes Recent Labs  Lab 11/04/17 1136  TROPONINI 0.03*   ------------------------------------------------------------------------------------------------------------------ Invalid input(s): POCBNP  ---------------------------------------------------------------------------------------------------------------  Urinalysis    Component Value Date/Time   APPEARANCEUR Cloudy (A) 10/31/2017 0921   GLUCOSEU Negative 10/31/2017 0921   BILIRUBINUR Negative 10/31/2017 0921   PROTEINUR 1+ (A) 10/31/2017 0921   NITRITE Positive (A)  10/31/2017 0921   LEUKOCYTESUR 1+ (A) 10/31/2017 0921     RADIOLOGY: Dg Chest Port 1 View  Result Date: 11/04/2017 CLINICAL DATA:  Dyspnea and shortness of breat. EXAM: PORTABLE CHEST 1 VIEW COMPARISON:  PA and lateral chest 05/10/2017 and 06/13/2016. FINDINGS: There is cardiomegaly and mild interstitial edema. More focal airspace disease is seen in the right lung base. No pneumothorax or pleural effusion. Aortic atherosclerosis noted. No focal bony abnormality. IMPRESSION: Cardiomegaly and mild interstitial edema. More focal airspace disease in the right lung base could be due to atelectasis or pneumonia. Electronically Signed   By: Inge Rise M.D.   On: 11/04/2017 12:14    EKG: Orders placed or performed during the hospital encounter of 11/04/17  . EKG 12-Lead  . EKG 12-Lead  . ED EKG  . ED EKG    IMPRESSION AND PLAN: *Acute hypoxic respiratory failure Exact etiology is unknown Suspect due to acute lung injury versus acute allergic reaction from Bactrim, compounded by immunosuppression from rheumatoid arthritis/on Remicade Case discussed with intensivist, admit to ICU, empiric IV length of Levaquin for now, check stat ABG-arterial, aggressive pulmonary toilet with bronchodilator therapy, IV Solu-Medrol with tapering as tolerated, mid mucolytic agents, respiratory therapy to see, BiPAP/CPAP as needed given acute respiratory acidosis on venous blood gas, supplemental oxygen with weaning as tolerated, rule out acute coronary syndrome with cardiac  enzymes x3 sets, follow-up on CT of the chest to evaluate for possible pulmonary embolism-though doubt it given use of chronic Xarelto for A. fib, and continue close medical monitoring  *Acute allergic reaction Due to Bactrim  Benadryl as needed, IV Solu-Medrol per above, Pepcid twice daily  *Chronic atrial fibrillation Stable Continue Inderal and Xarelto  *Chronic hypothyroidism, unspecified Stable Continue Synthroid  *Chronic GERD  without esophagitis Pepcid IV twice daily  *Chronic rheumatoid arthritis Stable On Remicade IV every 6 to 8 weeks We will need to follow-up with rheumatologist status post discharge for continued care/medical management  *Acute UTI Levaquin as stated above Repeat urinalysis    All the records are reviewed and case discussed with ED provider. Management plans discussed with the patient, family and they are in agreement.  CODE STATUS:full    TOTAL TIME TAKING CARE OF THIS PATIENT: 45 minutes.    Avel Peace Salary M.D on 11/04/2017   Between 7am to 6pm - Pager - [phone]  After 6pm go to [email or web address] - password [email or web address] [facility]  Office  [phone]  CC: Primary care physician; Trinna Post, PA-C   Note: This dictation was prepared with Dragon dictation along with smaller phrase technology. Any transcriptional errors that result from this process are unintentional.

## 2017-11-04 NOTE — ED Provider Notes (Signed)
[facility] Emergency Department Provider Note   ____________________________________________   First MD Initiated Contact with Patient 11/04/17 1104     (approximate)  I have reviewed the triage vital signs and the nursing notes.   HISTORY  Chief Complaint Shortness of Breath    HPI Tazaria SOLARIS KRAM is a 77 y.o. female evaluation of shortness of breath  Patient reports that she was recently diagnosed as having a reaction to Bactrim for which she is now been switched to Keflex but 2 days ago had shortness of breath, swelling and rash.  Today she is continued on prednisone, Benadryl both of which she is taken today but began feeling short of breath.  On arrival to the ER her oxygen saturation reported at 77% per my charge nurse Bill.  She reports she is feeling somewhat better after breathing treatment being placed on oxygen therapy.  No pain.  No fevers or chills.  She is noticed an increasing slight red rash over her back and front side, same rash that started after she started the Bactrim therapy.  She has not had any blistering or scaling of the skin.  Shortness of breath started rather abruptly in about 10 AM this morning with a slight amount of wheezing.  No leg swelling.  No history of heart disease but does have a history of atrial fibrillation and is on Xarelto also notable for rheumatoid arthritis for which she gets Remicade infusions  Past Medical History:  Diagnosis Date  . A-fib ([place])   . Arthritis   . GERD (gastroesophageal reflux disease)   . Heart disease   . Rheumatoid arthritis ([place])   . Thyroid disease   . Tremors of nervous system     Patient Active Problem List   Diagnosis Date Noted  . Respiratory failure ([place]) 11/04/2017  . Encounter for screening colonoscopy 06/23/2016  . Hypertension 04/26/2015  . Dry mouth 04/26/2015  . Bell palsy 09/04/2014  . Obesity 09/04/2014  . Allergic rhinitis 08/04/2014  . Anxiety 08/04/2014  .  Cannot sleep 08/04/2014  . Neuropathic pain 08/04/2014  . Atrial fibrillation ([place]) 08/04/2014  . Avitaminosis D 08/04/2014  . Gastroesophageal reflux disease without esophagitis 11/07/2013  . Polypharmacy 08/19/2013  . Osteoarthritis 06/11/2013  . Rheumatoid arthritis ([place]) 06/11/2013  . Hypercholesteremia 05/12/2009  . Benign essential tremor 05/12/2009  . Elevated blood sugar 08/26/2008  . Adult hypothyroidism 08/26/2008  . Menopausal symptom 08/26/2008    Past Surgical History:  Procedure Laterality Date  . BREAST BIOPSY Left 1988   Benign  . BREAST BIOPSY Left 1987   Benign  . BROW LIFT Bilateral 05/23/2016   Procedure: BLEPHAROPLASTY upper eyelid; w/excess skin;  Surgeon: Karle Starch, MD;  Location: [place];  Service: Ophthalmology;  Laterality: Bilateral;  . CARPAL TUNNEL RELEASE Bilateral   . CHOLECYSTECTOMY     Dr. Bary Castilla  . COLONOSCOPY  2006  . JOINT REPLACEMENT Bilateral    knees  . REPLACEMENT TOTAL KNEE Left 2005  . REPLACEMENT TOTAL KNEE Right 1998    Prior to Admission medications   Medication Sig Start Date End Date Taking? Authorizing Provider  albuterol (PROVENTIL HFA;VENTOLIN HFA) 108 (90 Base) MCG/ACT inhaler Inhale 2 puffs into the lungs every 6 (six) hours as needed for wheezing or shortness of breath. 11/02/17  Yes Alfred Levins, [place], MD  cholecalciferol (VITAMIN D) 400 units TABS tablet Take 400 Units by mouth daily.   Yes [provider]  EPINEPHrine 0.3 mg/0.3 mL IJ SOAJ  injection Inject 0.3 mLs into the skin once as needed for anaphylaxis. 11/03/17  Yes [provider]  folic acid (FOLVITE) 1 MG tablet Take 1 mg by mouth daily.   Yes [provider]  hydrochlorothiazide (HYDRODIURIL) 12.5 MG tablet TAKE 1 TABLET BY MOUTH  DAILY 09/17/17  Yes Carles Collet M, PA-C  InFLIXimab (REMICADE IV) Inject into the vein. Every 6-8 weeks   Yes [provider]  levothyroxine (SYNTHROID, LEVOTHROID) 75 MCG tablet  TAKE 1 TABLET BY MOUTH  DAILY 10/29/17  Yes Carles Collet M, PA-C  methotrexate (RHEUMATREX) 2.5 MG tablet Take 20 mg by mouth every Thursday.  10/25/13  Yes [provider]  Omega-3 Fatty Acids (FISH OIL) 1000 MG CAPS Take 1 capsule by mouth daily.   Yes [provider]  omeprazole (PRILOSEC) 20 MG capsule TAKE 1 CAPSULE BY MOUTH 2  TIMES DAILY BEFORE A MEAL. Patient taking differently: Take 20 mg by mouth 2 (two) times daily before a meal. TAKE 1 CAPSULE BY MOUTH 2  TIMES DAILY BEFORE A MEAL. 05/01/17  Yes Terrilee Croak, Adriana M, PA-C  potassium chloride SA (K-DUR,KLOR-CON) 20 MEQ tablet Take 1 tablet by mouth  daily 03/26/15  Yes Margarita Rana, MD  propranolol (INDERAL) 40 MG tablet Take 40-80 mg by mouth 2 (two) times daily. 2 tablets in the am, 1 tablet in pm   Yes [provider]  Red Yeast Rice 600 MG CAPS Take 1 capsule by mouth daily.   Yes [provider]  rivaroxaban (XARELTO) 20 MG TABS tablet TAKE 1 TABLET BY MOUTH ONCE DAILY 02/07/16  Yes [provider]  sertraline (ZOLOFT) 50 MG tablet TAKE 3 TABLETS BY MOUTH  DAILY 10/29/17  Yes Carles Collet M, PA-C    Allergies Bactrim [sulfamethoxazole-trimethoprim]; Macrobid [nitrofurantoin macrocrystal]; Mirabegron; Oxybutynin; and Penicillins  Family History  Problem Relation Age of Onset  . Stroke Mother   . Hypertension Mother   . Heart disease Father   . Arthritis Father   . Parkinson's disease Sister   . Parkinson's disease Brother   . Bladder Cancer Brother   . Breast cancer Neg Hx   . Kidney cancer Neg Hx     Social History Social History   Tobacco Use  . Smoking status: Never Smoker  . Smokeless tobacco: Never Used  Substance Use Topics  . Alcohol use: No  . Drug use: No    Review of Systems  Constitutional: No fever/chills. Eyes: No visual changes. ENT: No sore throat. Cardiovascular: Denies chest pain. Respiratory: See HPI  gastrointestinal: No abdominal pain.  No  nausea, no vomiting.  No diarrhea.  No constipation. Genitourinary: Negative for dysuria. Musculoskeletal: Negative for back pain. Skin: Negative for rash. Neurological: Negative for headaches, focal weakness or numbness.  ____________________________________________   PHYSICAL EXAM:  VITAL SIGNS: ED Triage Vitals  Enc Vitals Group     BP --      Pulse Rate 11/04/17 1059 99     Resp 11/04/17 1114 (!) 30     Temp --      Temp src --      SpO2 11/04/17 1114 100 %     Weight 11/04/17 1113 223 lb 1.7 oz (101.2 kg)     Height 11/04/17 1113 5\' 5"  (1.651 m)     Head Circumference --      Peak Flow --      Pain Score 11/04/17 1113 0     Pain Loc --  Pain Edu? --      Excl. in [place]? --     Constitutional: Alert and oriented.  Moderately ill, appears dyspneic sitting upright in a slight tripod position utilizing nebulizer. Eyes: Conjunctivae are normal. Head: Atraumatic. Nose: No congestion/rhinnorhea. Mouth/Throat: Mucous membranes are moist.  No JVD. Neck: No stridor.   Cardiovascular: Normal rate, regular rhythm. Grossly normal heart sounds.  Good peripheral circulation. Respiratory: Mild tachypnea.  Speaks in phrases.  Right lung questionably some scant wheezing, left lung primarily clear.  Mild use of accessory muscles.  Prefers to sit upright Gastrointestinal: Soft and nontender. No distention. Musculoskeletal: No lower extremity tenderness nor edema. Neurologic:  Normal speech and language. No gross focal neurologic deficits are appreciated.  Skin:  Skin is warm, dry and intac though there are spread across her torso multiple very small papular, acne-like appearing lesions in multiple regions spread fairly uniformly with some slight convalescence over the lower lumbar back region without blistering or bulla formation t. No rash noted. Psychiatric: Mood and affect are normal. Speech and behavior are normal.  ____________________________________________   LABS (all labs  ordered are listed, but only abnormal results are displayed)  Labs Reviewed  CBC WITH DIFFERENTIAL/PLATELET - Abnormal; Notable for the following components:      Result Value   WBC 30.4 (*)    RBC 5.25 (*)    Hemoglobin 16.2 (*)    HCT 49.0 (*)    RDW 15.0 (*)    Neutro Abs 26.4 (*)    Monocytes Absolute 1.7 (*)    All other components within normal limits  COMPREHENSIVE METABOLIC PANEL - Abnormal; Notable for the following components:   Chloride 97 (*)    Glucose, Bld 184 (*)    Calcium 8.8 (*)    Albumin 3.4 (*)    Total Bilirubin 1.3 (*)    All other components within normal limits  TROPONIN I - Abnormal; Notable for the following components:   Troponin I 0.03 (*)    All other components within normal limits  BRAIN NATRIURETIC PEPTIDE - Abnormal; Notable for the following components:   B Natriuretic Peptide 274.0 (*)    All other components within normal limits  FIBRIN DERIVATIVES D-DIMER ([facility] ONLY) - Abnormal; Notable for the following components:   Fibrin derivatives D-dimer ([facility]) 1,430.45 (*)    All other components within normal limits  BLOOD GAS, VENOUS - Abnormal; Notable for the following components:   pCO2, Ven 73 (*)    Bicarbonate 32.0 (*)    All other components within normal limits  URINALYSIS, COMPLETE (UACMP) WITH MICROSCOPIC - Abnormal; Notable for the following components:   Color, Urine YELLOW (*)    APPearance CLEAR (*)    Specific Gravity, Urine 1.040 (*)    Hgb urine dipstick MODERATE (*)    Protein, ur 30 (*)    All other components within normal limits  TROPONIN I - Abnormal; Notable for the following components:   Troponin I 0.07 (*)    All other components within normal limits  TSH - Abnormal; Notable for the following components:   TSH 6.104 (*)    All other components within normal limits  BLOOD GAS, ARTERIAL - Abnormal; Notable for the following components:   pO2, Arterial 63 (*)    Bicarbonate 30.5 (*)    Acid-Base Excess 5.2 (*)     Allens test (pass/fail) POSITIVE (*)    All other components within normal limits  MRSA PCR SCREENING  CULTURE, BLOOD (ROUTINE X  2)  CULTURE, BLOOD (ROUTINE X 2)  RESPIRATORY PANEL BY PCR  LACTIC ACID, PLASMA  GLUCOSE, CAPILLARY  PROCALCITONIN  LACTIC ACID, PLASMA  TROPONIN I  TROPONIN I  LEGIONELLA PNEUMOPHILA SEROGP 1 UR AG  MYCOPLASMA PNEUMONIAE ANTIBODY, IGM  CALCIUM, IONIZED   ____________________________________________  EKG  EKG is reviewed, interpreted at 1055 Heart rate 95 QRS 99 QTc 480 Atrial fibrillation, but notable ST depressions noted in inferior and lateral distribution, compared with previous there are concerning for possible new ischemic change.  There is no ST elevation.  Of note the patient denies chest pain at this time but does report dyspnea ____________________________________________  RADIOLOGY  Ct Angio Chest Pe W And/or Wo Contrast  Result Date: 11/04/2017 CLINICAL DATA:  Short of breath. Hypoxia. Recent drug reaction to Bactrim. Patient receiving immunotherapy for rheumatoid arthritis EXAM: CT ANGIOGRAPHY CHEST WITH CONTRAST TECHNIQUE: Multidetector CT imaging of the chest was performed using the standard protocol during bolus administration of intravenous contrast. Multiplanar CT image reconstructions and MIPs were obtained to evaluate the vascular anatomy. CONTRAST:  54mL OMNIPAQUE IOHEXOL 350 MG/ML SOLN COMPARISON:  11/04/2017 FINDINGS: Cardiovascular: No filling defects within the pulmonary arteries to suggest acute pulmonary embolism. No acute findings of the aorta or great vessels. No pericardial fluid. Coronary artery calcification and aortic atherosclerotic calcification. Mediastinum/Nodes: No axillary supraclavicular adenopathy. No mediastinal hilar adenopathy. Borderline enlarged RIGHT lower paratracheal lymph node is measures 10 mm short axis subcarinal node measures 11 mm short axis. Lungs/Pleura: Bilateral dense ground-glass opacities which are  flame shaped and noted throughout the LEFT and RIGHT lung in a somewhat perihilar distribution (coronal image 48/7). No discrete measurable nodularity. There is mild the thickening of the associated bronchials. No bronchiectasis. No pleural fluid. Example opacity in the medial RIGHT middle lobe measuring 3.5 by 2.3 cm. Example opacity in the lingula measuring 2.8 by 1.9 cm Upper Abdomen: Limited view of the liver, kidneys, pancreas are unremarkable. Normal adrenal glands. Musculoskeletal: No aggressive osseous lesion. Degenerative osteophytosis of the spine. Review of the MIP images confirms the above findings. IMPRESSION: 1. Bilateral flame shaped dense ground-glass opacities do not have a pattern of typical of pulmonary infection. Mild bronchial thickening associated with the predominately centralized pulmonary opacities. Finding are more suggestive of inflammatory process or drug reaction. Recommend follow-up high-resolution CT if symptoms do not improve following acute exacerbation. 2. Mild mediastinal adenopathy is likely reactive. 3. No evidence of acute pulmonary embolism. Electronically Signed   By: Suzy Bouchard M.D.   On: 11/04/2017 13:43   Dg Chest Port 1 View  Result Date: 11/04/2017 CLINICAL DATA:  Dyspnea and shortness of breat. EXAM: PORTABLE CHEST 1 VIEW COMPARISON:  PA and lateral chest 05/10/2017 and 06/13/2016. FINDINGS: There is cardiomegaly and mild interstitial edema. More focal airspace disease is seen in the right lung base. No pneumothorax or pleural effusion. Aortic atherosclerosis noted. No focal bony abnormality. IMPRESSION: Cardiomegaly and mild interstitial edema. More focal airspace disease in the right lung base could be due to atelectasis or pneumonia. Electronically Signed   By: Inge Rise M.D.   On: 11/04/2017 12:14    ____________________________________________   PROCEDURES  Procedure(s) performed: None  Procedures  Critical Care performed: Yes, see  critical care note(s)  CRITICAL CARE Performed by: Delman Kitten   Total critical care time: 38 minutes  Critical care time was exclusive of separately billable procedures and treating other patients.  Critical care was necessary to treat or prevent imminent or life-threatening deterioration.  Critical care  was time spent personally by me on the following activities: development of treatment plan with patient and/or surrogate as well as nursing, discussions with consultants, evaluation of patient's response to treatment, examination of patient, obtaining history from patient or surrogate, ordering and performing treatments and interventions, ordering and review of laboratory studies, ordering and review of radiographic studies, pulse oximetry and re-evaluation of patient's condition.  Felt to be at high risk for respiratory collapse given evaluation with respiratory distress and initial oxygen level less than 80% requiring acute evaluation by myself ____________________________________________   INITIAL IMPRESSION / ASSESSMENT AND PLAN / ED COURSE  Pertinent labs & imaging results that were available during my care of the patient were reviewed by me and considered in my medical decision making (see chart for details).    Dyspnea, possibly in the setting of an allergic reaction to Bactrim, though at this point the recurrence seems abnormal.  Also known rheumatoid, A. fib.  Her shortness of breath and presentation with hypoxia is very concerning, she is improved after oxygen therapy and a nebulizer treatment but her EKG shows ischemic abnormality, and her chest x-ray appears to me to show some slight right-sided versus left-sided cephalization and I would strongly consider the possibility of an acute infectious inflammatory or other etiology as to cause the symptoms.  She seems to be low risk for pulmonary embolism especially given she is compliant with Xarelto therapy and has no history of DVT  or PE.  After receiving nebulizer she does appear improved, but I await a battery of tests to be continue to evaluate with a broad differential  ----------------------------------------- 11:59 AM on 11/04/2017 -----------------------------------------  Reevaluation patient reports she feels improved.  Currently resting on 2 L nasal cannula with normal oxygen saturation and normalization of vital signs for some mild hypertension to noted.   ----------------------------------------- 12:39 PM on 11/04/2017 -----------------------------------------  Discussed case with Dr. Jerelyn Charles is now seeing the patient.  Patient vital signs of stabilized her oxygenation improved, concern regarding the etiology of her presentation.  Differential continues to include possible infectious, inflammatory, rheumatologic or other etiologies for her presentation with hypoxia.  Have discussed with Dr. Jerelyn Charles and he will see and evaluate the patient for further examination and decision on additional work-up at this time.  Clearly the patient requires admission to the hospital, possibly started on broad-spectrum antibiotics but will defer to hospitalist to evaluate as he is presently seeing the patient.  CT angiogram pending at time of admission.  Patient status improved, but guarded.   ____________________________________________   FINAL CLINICAL IMPRESSION(S) / ED DIAGNOSES  Final diagnoses:  Hypoxia  Rash  Abnormal EKG  Elevated troponin      NEW MEDICATIONS STARTED DURING THIS VISIT:  Current Discharge Medication List       Note:  This document was prepared using Dragon voice recognition software and may include unintentional dictation errors.     Delman Kitten, MD 11/04/17 [phone]

## 2017-11-04 NOTE — Consult Note (Signed)
Name: Glenda Williams MRN: [id number] DOB: 1940-10-19     CONSULTATION DATE: 11/04/2017    HISTORY OF PRESENT ILLNESS:    77 years old lady with history of atrial fibrillation on Xarelto, GERD, rheumatoid arthritis, atrial fibrillation and hypothyroidism.  Patient presented to ED with worsening shortness of breath found to be hypoxic with O2 sats 70% on nasal cannula with moderate respiratory distress.  pH 7.25 on VBG and the patient was noticed to have diffuse maculopapular skin rash for the last 2 days after she was started on Bactrim for her UTI. All history is obtained from admitting physician, the patient and EMR. Patient arrived to the intensive care unit awake in moderate respiratory distress on 4 L nasal cannula. PAST MEDICAL HISTORY :   has a past medical history of A-fib ([place]), Arthritis, GERD (gastroesophageal reflux disease), Heart disease, Rheumatoid arthritis ([place]), Thyroid disease, and Tremors of nervous system.  has a past surgical history that includes Joint replacement (Bilateral); Carpal tunnel release (Bilateral); Colonoscopy (2006); Cholecystectomy; Replacement total knee (Left, 2005); Replacement total knee (Right, 1998); Brow lift (Bilateral, 05/23/2016); Breast biopsy (Left, 1988); and Breast biopsy (Left, 1987). Prior to Admission medications   Medication Sig Start Date End Date Taking? Authorizing Provider  albuterol (PROVENTIL HFA;VENTOLIN HFA) 108 (90 Base) MCG/ACT inhaler Inhale 2 puffs into the lungs every 6 (six) hours as needed for wheezing or shortness of breath. 11/02/17  Yes Alfred Levins, [place], MD  cholecalciferol (VITAMIN D) 400 units TABS tablet Take 400 Units by mouth daily.   Yes [provider]  EPINEPHrine 0.3 mg/0.3 mL IJ SOAJ injection Inject 0.3 mLs into the skin once as needed for anaphylaxis. 11/03/17  Yes [provider]  folic acid (FOLVITE) 1 MG tablet Take 1 mg by mouth daily.   Yes [provider]  hydrochlorothiazide  (HYDRODIURIL) 12.5 MG tablet TAKE 1 TABLET BY MOUTH  DAILY 09/17/17  Yes Carles Collet M, PA-C  InFLIXimab (REMICADE IV) Inject into the vein. Every 6-8 weeks   Yes [provider]  levothyroxine (SYNTHROID, LEVOTHROID) 75 MCG tablet TAKE 1 TABLET BY MOUTH  DAILY 10/29/17  Yes Carles Collet M, PA-C  methotrexate (RHEUMATREX) 2.5 MG tablet Take 20 mg by mouth every Thursday.  10/25/13  Yes [provider]  Omega-3 Fatty Acids (FISH OIL) 1000 MG CAPS Take 1 capsule by mouth daily.   Yes [provider]  omeprazole (PRILOSEC) 20 MG capsule TAKE 1 CAPSULE BY MOUTH 2  TIMES DAILY BEFORE A MEAL. Patient taking differently: Take 20 mg by mouth 2 (two) times daily before a meal. TAKE 1 CAPSULE BY MOUTH 2  TIMES DAILY BEFORE A MEAL. 05/01/17  Yes Terrilee Croak, Adriana M, PA-C  potassium chloride SA (K-DUR,KLOR-CON) 20 MEQ tablet Take 1 tablet by mouth  daily 03/26/15  Yes Margarita Rana, MD  propranolol (INDERAL) 40 MG tablet Take 40-80 mg by mouth 2 (two) times daily. 2 tablets in the am, 1 tablet in pm   Yes [provider]  Red Yeast Rice 600 MG CAPS Take 1 capsule by mouth daily.   Yes [provider]  rivaroxaban (XARELTO) 20 MG TABS tablet TAKE 1 TABLET BY MOUTH ONCE DAILY 02/07/16  Yes [provider]  sertraline (ZOLOFT) 50 MG tablet TAKE 3 TABLETS BY MOUTH  DAILY 10/29/17  Yes Trinna Post, PA-C   Allergies  Allergen Reactions  . Bactrim [Sulfamethoxazole-Trimethoprim] Rash  . Macrobid [Nitrofurantoin Macrocrystal] Rash  . Mirabegron Rash  . Oxybutynin Rash  .  Penicillins Rash    Has patient had a PCN reaction causing immediate rash, facial/tongue/throat swelling, SOB or lightheadedness with hypotension: Yes Has patient had a PCN reaction causing severe rash involving mucus membranes or skin necrosis: No Has patient had a PCN reaction that required hospitalization: No Has patient had a PCN reaction occurring within the last 10 years:  Unknown If all of the above answers are "NO", then may proceed with Cephalosporin use.     FAMILY HISTORY:  family history includes Arthritis in her father; Bladder Cancer in her brother; Heart disease in her father; Hypertension in her mother; Parkinson's disease in her brother and sister; Stroke in her mother. SOCIAL HISTORY:  reports that she has never smoked. She has never used smokeless tobacco. She reports that she does not drink alcohol or use drugs.  REVIEW OF SYSTEMS:   Unable to obtain due to critical illness   VITAL SIGNS: Temp:  [98.3 F (36.8 C)] 98.3 F (36.8 C) (09/01 1349) Pulse Rate:  [69-99] 71 (09/01 1349) Resp:  [21-30] 21 (09/01 1349) BP: (158-162)/(70-83) 158/83 (09/01 1349) SpO2:  [93 %-100 %] 95 % (09/01 1415) Weight:  [101.2 kg-109.8 kg] 109.8 kg (09/01 1349)  Physical Examination:  Awake and oriented with no acute focal neurological deficits On nasal cannula 4 L/min, moderate respiratory distress, unable to talk in full sentences, bilateral equal air entry with bibasilar fine crackles S1 & S2 are audible with no murmur Benign abdominal exam with normal peristalsis No leg edema.  Diffuse maculopapular skin rash including back and trunk and sparing the face, palms and soles  ASSESSMENT / PLAN: Acute respiratory failure with moderate respiratory distress. -Try on high flow nasal cannula and consider PPV if no improvement -Monitor ABG and work of breathing  Pneumonitis with bilateral flame shaped opacity on CT scan and mild mediastinal adenopathy which is likely to be reactive considering the patient has drug allergy to Bactrim received within the last 48 hours with typical diffuse maculopapular rash. Right lower consolidation with mild pulmonary congestion on chest x-ray.  Infective etiology cannot be ruled out with the patient history of being immune compromised on methotrexate  -Steroids + antihistamines + S2 blockers -Empiric cefepime and DC  levofloxacin -Monitor CXR + CBC + FiO2 -Follow with procalcitonin and cultures including respiratory viral panel and work-up for atypical pneumonia. -Gentle diuresis to improve lung compliance  Hypothyroidism -Optimize levothyroxine and monitor free T4  Atrial fibrillation, CAD with anteroseptal ischemic changes on EKG. -Rate control and continue with Xarelto for anticoagulation -Aspirin -Follow-up with echocardiogram  Full code  DVT and GI prophylaxis.  Continue with supportive care  The patient was updated about her status and she agreed to the plan of care  Critical care time 50 minutes

## 2017-11-04 NOTE — Progress Notes (Signed)
Pharmacy Antibiotic Note  Glenda Williams is a 77 y.o. female admitted on 11/04/2017 with PNA.  Pharmacy has been consulted for cefepime dosing.  Plan: Patient has documented penicillin allergy but no agent specified and she has recently taken a cephalosporin (11/02/17) with no documented issue. Start cefepime 2 gm IV Q12H for HCAP.   Height: 5\' 6"  (167.6 cm) Weight: 242 lb 1 oz (109.8 kg) IBW/kg (Calculated) : 59.3  Temp (24hrs), Avg:98.3 F (36.8 C), Min:98.3 F (36.8 C), Max:98.3 F (36.8 C)  Recent Labs  Lab 11/02/17 1023 11/04/17 1136  WBC  --  30.4*  CREATININE  --  0.78  LATICACIDVEN 0.9  --     Estimated Creatinine Clearance: 76.3 mL/min (by C-G formula based on SCr of 0.78 mg/dL).    Allergies  Allergen Reactions  . Bactrim [Sulfamethoxazole-Trimethoprim] Rash  . Macrobid [Nitrofurantoin Macrocrystal] Rash  . Mirabegron Rash  . Oxybutynin Rash  . Penicillins Rash    Has patient had a PCN reaction causing immediate rash, facial/tongue/throat swelling, SOB or lightheadedness with hypotension: Yes Has patient had a PCN reaction causing severe rash involving mucus membranes or skin necrosis: No Has patient had a PCN reaction that required hospitalization: No Has patient had a PCN reaction occurring within the last 10 years: Unknown If all of the above answers are "NO", then may proceed with Cephalosporin use.     Antimicrobials this admission:   Dose adjustments this admission:   Microbiology results:  BCx:   UCx:    Sputum:    MRSA PCR:   Thank you for allowing pharmacy to be a part of this patient's care.  Laural Benes, Pharm.D., BCPS Clinical Pharmacist 11/04/2017 2:53 PM

## 2017-11-04 NOTE — Progress Notes (Signed)
Family Meeting Note  Advance Directive:yes  Today a meeting took place with the Patient.  Patient is able to participate   The following clinical team members were present during this meeting:MD  The following were discussed:Patient's diagnosis: Acute respiratory failure, allergic reaction, Patient's progosis: Unable to determine and Goals for treatment: Full Code  Additional follow-up to be provided: prn  Time spent during discussion:20 minutes  Gorden Harms, MD

## 2017-11-04 NOTE — Progress Notes (Signed)
Patient admitted to ICU, placed on Bipap. Labs obtained and Echo completed, UA sent to lab. Dr. Soyla Murphy updated patient's family on plan of care. Patient tolerating Bipap, able to be off for short periods of time. Patient on droplet pending results of RSV panel-specimen sent to lab- repositioned as tolerated for comfort and ease of breathing with patient assistance.

## 2017-11-04 NOTE — ED Triage Notes (Signed)
Pt arrives from home, SOB, sats in 70's upon arrival, placed on non rebreather by charge, pt improves, changed to nasal cannula on 4L. sats 100%. Quale bedside.

## 2017-11-05 LAB — CBC WITH DIFFERENTIAL/PLATELET
Basophils Absolute: 0 10*3/uL (ref 0–0.1)
Basophils Relative: 0 %
EOS ABS: 0.6 10*3/uL (ref 0–0.7)
Eosinophils Relative: 2 %
HCT: 47.3 % — ABNORMAL HIGH (ref 35.0–47.0)
Hemoglobin: 15.9 g/dL (ref 12.0–16.0)
LYMPHS ABS: 1 10*3/uL (ref 1.0–3.6)
LYMPHS PCT: 3 %
MCH: 30.8 pg (ref 26.0–34.0)
MCHC: 33.7 g/dL (ref 32.0–36.0)
MCV: 91.3 fL (ref 80.0–100.0)
MONOS PCT: 3 %
Monocytes Absolute: 1 10*3/uL — ABNORMAL HIGH (ref 0.2–0.9)
NEUTROS PCT: 92 %
Neutro Abs: 30.4 10*3/uL — ABNORMAL HIGH (ref 1.4–6.5)
Platelets: 250 10*3/uL (ref 150–440)
RBC: 5.17 MIL/uL (ref 3.80–5.20)
RDW: 14.6 % — ABNORMAL HIGH (ref 11.5–14.5)
WBC: 32.9 10*3/uL — ABNORMAL HIGH (ref 3.6–11.0)

## 2017-11-05 LAB — RESPIRATORY PANEL BY PCR
ADENOVIRUS-RVPPCR: NOT DETECTED
Bordetella pertussis: NOT DETECTED
CORONAVIRUS NL63-RVPPCR: NOT DETECTED
CORONAVIRUS OC43-RVPPCR: NOT DETECTED
Chlamydophila pneumoniae: NOT DETECTED
Coronavirus 229E: NOT DETECTED
Coronavirus HKU1: NOT DETECTED
INFLUENZA A-RVPPCR: NOT DETECTED
Influenza B: NOT DETECTED
METAPNEUMOVIRUS-RVPPCR: NOT DETECTED
Mycoplasma pneumoniae: NOT DETECTED
PARAINFLUENZA VIRUS 1-RVPPCR: NOT DETECTED
PARAINFLUENZA VIRUS 2-RVPPCR: NOT DETECTED
Parainfluenza Virus 3: NOT DETECTED
Parainfluenza Virus 4: NOT DETECTED
RESPIRATORY SYNCYTIAL VIRUS-RVPPCR: NOT DETECTED
Rhinovirus / Enterovirus: NOT DETECTED

## 2017-11-05 LAB — BASIC METABOLIC PANEL
Anion gap: 9 (ref 5–15)
BUN: 16 mg/dL (ref 8–23)
CALCIUM: 8.6 mg/dL — AB (ref 8.9–10.3)
CO2: 32 mmol/L (ref 22–32)
Chloride: 97 mmol/L — ABNORMAL LOW (ref 98–111)
Creatinine, Ser: 0.7 mg/dL (ref 0.44–1.00)
GFR calc Af Amer: >60 mL/min (ref >60)
GLUCOSE: 110 mg/dL — AB (ref 70–99)
Potassium: 3.5 mmol/L (ref 3.5–5.1)
Sodium: 138 mmol/L (ref 135–145)

## 2017-11-05 LAB — ECHOCARDIOGRAM COMPLETE
HEIGHTINCHES: 66 in
Weight: 3873.04 oz

## 2017-11-05 LAB — MAGNESIUM: Magnesium: 1.8 mg/dL (ref 1.7–2.4)

## 2017-11-05 LAB — PHOSPHORUS: Phosphorus: 2.7 mg/dL (ref 2.5–4.6)

## 2017-11-05 LAB — TROPONIN I: Troponin I: 0.03 ng/mL (ref <0.03)

## 2017-11-05 LAB — T4, FREE: FREE T4: 0.95 ng/dL (ref 0.82–1.77)

## 2017-11-05 MED ORDER — HYDROCORTISONE 1 % EX CREA
TOPICAL_CREAM | CUTANEOUS | Status: DC | PRN
  Filled 2017-11-05: qty 28

## 2017-11-05 MED ORDER — METHYLPREDNISOLONE SODIUM SUCC 125 MG IJ SOLR
60.0000 mg | Freq: Two times a day (BID) | INTRAMUSCULAR | Status: DC
  Administered 2017-11-06 (×2): 60 mg via INTRAVENOUS
  Filled 2017-11-05 (×2): qty 2

## 2017-11-05 NOTE — Plan of Care (Addendum)
Patient alert and oriented.  BiPap continues.  Tolerated well.  Able to make needs known.  Good urine output this shift. Rash improving.  Benadryl given with relief.  No c/o discomfort or pain this shift. Will continue to monitor.

## 2017-11-05 NOTE — Progress Notes (Signed)
[facility] [place] NAME: Glenda Williams    MR#:  [id number]  DATE OF BIRTH:  [birth year]  SUBJECTIVE:  CHIEF COMPLAINT:   Chief Complaint  Patient presents with  . Shortness of Breath   -Remains on BiPAP.  Breathing is improving. -Rash on her trunk is slowly fading  REVIEW OF SYSTEMS:  Review of Systems  Constitutional: Negative for chills, fever and malaise/fatigue.  HENT: Negative for congestion, ear discharge, hearing loss and nosebleeds.   Eyes: Negative for blurred vision.  Respiratory: Positive for shortness of breath. Negative for cough and wheezing.   Cardiovascular: Negative for chest pain and palpitations.  Gastrointestinal: Negative for abdominal pain, constipation, diarrhea, nausea and vomiting.  Genitourinary: Negative for dysuria and urgency.  Musculoskeletal: Positive for myalgias.  Skin: Positive for rash.  Neurological: Negative for dizziness, focal weakness, seizures and headaches.  Psychiatric/Behavioral: Negative for depression.    DRUG ALLERGIES:   Allergies  Allergen Reactions  . Bactrim [Sulfamethoxazole-Trimethoprim] Rash  . Macrobid [Nitrofurantoin Macrocrystal] Rash  . Mirabegron Rash  . Oxybutynin Rash  . Penicillins Rash    Has patient had a PCN reaction causing immediate rash, facial/tongue/throat swelling, SOB or lightheadedness with hypotension: Yes Has patient had a PCN reaction causing severe rash involving mucus membranes or skin necrosis: No Has patient had a PCN reaction that required hospitalization: No Has patient had a PCN reaction occurring within the last 10 years: Unknown If all of the above answers are "NO", then may proceed with Cephalosporin use.     VITALS:  Blood pressure 131/77, pulse 78, temperature 99.3 F (37.4 C), temperature source Axillary, resp. rate 20, height 5\' 6"  (1.676 m), weight 107.8 kg, SpO2 97 %.  PHYSICAL EXAMINATION:  Physical Exam  GENERAL:  77  y.o.-year-old patient lying in the bed with no acute distress.  EYES: Pupils equal, round, reactive to light and accommodation. No scleral icterus. Extraocular muscles intact.  HEENT: Head atraumatic, normocephalic. Oropharynx and nasopharynx clear.  NECK:  Supple, no jugular venous distention. No thyroid enlargement, no tenderness.  LUNGS: Normal breath sounds bilaterally, no wheezing, rales,rhonchi or crepitation. No use of accessory muscles of respiration.  CARDIOVASCULAR: S1, S2 normal. No  rubs, or gallops. 3/6 systolic murmur is present ABDOMEN: Soft, nontender, nondistended. Bowel sounds present. No organomegaly or mass.  EXTREMITIES: No pedal edema, cyanosis, or clubbing.  NEUROLOGIC: Cranial nerves II through XII are intact. Muscle strength 5/5 in all extremities. Sensation intact. Gait not checked.  PSYCHIATRIC: The patient is alert and oriented x 3.  SKIN: significant maculopapular rash all over her trunk, back, and upper part of legs noted.    LABORATORY PANEL:   CBC Recent Labs  Lab 11/05/17 0143  WBC 32.9*  HGB 15.9  HCT 47.3*  PLT 250   ------------------------------------------------------------------------------------------------------------------  Chemistries  Recent Labs  Lab 11/04/17 1136 11/05/17 0143  NA 136 138  K 4.2 3.5  CL 97* 97*  CO2 30 32  GLUCOSE 184* 110*  BUN 15 16  CREATININE 0.78 0.70  CALCIUM 8.8* 8.6*  MG  --  1.8  AST 30  --   ALT 24  --   ALKPHOS 65  --   BILITOT 1.3*  --    ------------------------------------------------------------------------------------------------------------------  Cardiac Enzymes Recent Labs  Lab 11/05/17 0143  TROPONINI 0.03*   ------------------------------------------------------------------------------------------------------------------  RADIOLOGY:  Ct Angio Chest Pe W And/or Wo Contrast  Result Date: 11/04/2017 CLINICAL DATA:  Short of breath. Hypoxia. Recent drug  reaction to Bactrim. Patient  receiving immunotherapy for rheumatoid arthritis EXAM: CT ANGIOGRAPHY CHEST WITH CONTRAST TECHNIQUE: Multidetector CT imaging of the chest was performed using the standard protocol during bolus administration of intravenous contrast. Multiplanar CT image reconstructions and MIPs were obtained to evaluate the vascular anatomy. CONTRAST:  68mL OMNIPAQUE IOHEXOL 350 MG/ML SOLN COMPARISON:  11/04/2017 FINDINGS: Cardiovascular: No filling defects within the pulmonary arteries to suggest acute pulmonary embolism. No acute findings of the aorta or great vessels. No pericardial fluid. Coronary artery calcification and aortic atherosclerotic calcification. Mediastinum/Nodes: No axillary supraclavicular adenopathy. No mediastinal hilar adenopathy. Borderline enlarged RIGHT lower paratracheal lymph node is measures 10 mm short axis subcarinal node measures 11 mm short axis. Lungs/Pleura: Bilateral dense ground-glass opacities which are flame shaped and noted throughout the LEFT and RIGHT lung in a somewhat perihilar distribution (coronal image 48/7). No discrete measurable nodularity. There is mild the thickening of the associated bronchials. No bronchiectasis. No pleural fluid. Example opacity in the medial RIGHT middle lobe measuring 3.5 by 2.3 cm. Example opacity in the lingula measuring 2.8 by 1.9 cm Upper Abdomen: Limited view of the liver, kidneys, pancreas are unremarkable. Normal adrenal glands. Musculoskeletal: No aggressive osseous lesion. Degenerative osteophytosis of the spine. Review of the MIP images confirms the above findings. IMPRESSION: 1. Bilateral flame shaped dense ground-glass opacities do not have a pattern of typical of pulmonary infection. Mild bronchial thickening associated with the predominately centralized pulmonary opacities. Finding are more suggestive of inflammatory process or drug reaction. Recommend follow-up high-resolution CT if symptoms do not improve following acute exacerbation. 2. Mild  mediastinal adenopathy is likely reactive. 3. No evidence of acute pulmonary embolism. Electronically Signed   By: Suzy Bouchard M.D.   On: 11/04/2017 13:43   Dg Chest Port 1 View  Result Date: 11/04/2017 CLINICAL DATA:  Dyspnea and shortness of breat. EXAM: PORTABLE CHEST 1 VIEW COMPARISON:  PA and lateral chest 05/10/2017 and 06/13/2016. FINDINGS: There is cardiomegaly and mild interstitial edema. More focal airspace disease is seen in the right lung base. No pneumothorax or pleural effusion. Aortic atherosclerosis noted. No focal bony abnormality. IMPRESSION: Cardiomegaly and mild interstitial edema. More focal airspace disease in the right lung base could be due to atelectasis or pneumonia. Electronically Signed   By: Inge Rise M.D.   On: 11/04/2017 12:14    EKG:   Orders placed or performed during the hospital encounter of 11/04/17  . EKG 12-Lead  . EKG 12-Lead  . ED EKG  . ED EKG    ASSESSMENT AND PLAN:   77 year old female with past medical history significant for A. fib on Xarelto, CAD, rheumatoid arthritis, hypothyroidism and GERD presents from hospital secondary to difficulty breathing and also significant rash all over her body.  1.  Acute hypoxic respiratory failure- -likely acute pneumonitis secondary to allergic reaction to Bactrim. -Appreciate pulmonary consult.  CT with significant opacities and mediastinal adenopathy noted. -Continue steroids, antihistamines at this time -Empiric antibiotics with cefepime due to leukocytosis. -Currently on BiPAP, not on home oxygen.  Wean to nasal cannula as tolerated. -Respiratory viral panel is pending  2.  Maculopapular rash-likely secondary to sulfa allergy.  Diffuse maculopapular rash noted on upper part of the legs, trunk, palms and back. -Continue treatment with steroids, antihistamines for now.  3.  Leukocytosis-no other source of infection noted.  Being treated for infection/pneumonitis with cefepime and also on  steroids. -Monitor closely.  Denies any diarrhea. -Procalcitonin is negative  4.  Atrial fibrillation-rate controlled On  Xarelto for anticoagulation.  5.  Hypothyroidism-continue Synthroid  6.  DVT prophylaxis-patient already on Xarelto  Baseline ambulates with a cane.  Likely physical therapy tomorrow    All the records are reviewed and case discussed with Care Management/Social Workerr. Management plans discussed with the patient, family and they are in agreement.  CODE STATUS: Full Code  TOTAL TIME TAKING CARE OF THIS PATIENT: 39 minutes.   POSSIBLE D/C IN 2 DAYS, DEPENDING ON CLINICAL CONDITION.   Gladstone Lighter M.D on 11/05/2017 at 12:56 PM  Between 7am to 6pm - Pager - [phone]  After 6pm go to [email or web address] - password [email or web address] [facility]  Office  [phone]  CC: Primary care physician; Trinna Post, PA-C

## 2017-11-05 NOTE — Progress Notes (Signed)
CRITICAL VALUE ALERT  Critical Value:  0.03 down from 0.07  Date & Time Notied:  11/05/2017 @ 02:12am  Provider Notified: E-link provider via T. [facility]  Orders Received/Actions taken: No new orders

## 2017-11-05 NOTE — Progress Notes (Signed)
Pharmacy Antibiotic Note  Glenda Williams is a 77 y.o. female admitted on 11/04/2017 with PNA.  Pharmacy has been consulted for cefepime dosing.  Plan: Patient has documented penicillin allergy but no agent specified and she has recently taken a cephalosporin (11/02/17) with no documented issue. Start cefepime 2 gm IV Q12H for HCAP.   Height: 5\' 6"  (167.6 cm) Weight: 237 lb 10.5 oz (107.8 kg) IBW/kg (Calculated) : 59.3  Temp (24hrs), Avg:99.2 F (37.3 C), Min:99 F (37.2 C), Max:99.3 F (37.4 C)  Recent Labs  Lab 11/02/17 1023 11/04/17 1136 11/04/17 1423 11/05/17 0143  WBC  --  30.4*  --  32.9*  CREATININE  --  0.78  --  0.70  LATICACIDVEN 0.9  --  1.5  --     Estimated Creatinine Clearance: 74.3 mL/min (by C-G formula based on SCr of 0.7 mg/dL).    Allergies  Allergen Reactions  . Bactrim [Sulfamethoxazole-Trimethoprim] Rash  . Macrobid [Nitrofurantoin Macrocrystal] Rash  . Mirabegron Rash  . Oxybutynin Rash  . Penicillins Rash    Has patient had a PCN reaction causing immediate rash, facial/tongue/throat swelling, SOB or lightheadedness with hypotension: Yes Has patient had a PCN reaction causing severe rash involving mucus membranes or skin necrosis: No Has patient had a PCN reaction that required hospitalization: No Has patient had a PCN reaction occurring within the last 10 years: Unknown If all of the above answers are "NO", then may proceed with Cephalosporin use.     Antimicrobials this admission:   Dose adjustments this admission:   Microbiology results:  BCx:   UCx:    Sputum:    MRSA PCR:   Thank you for allowing pharmacy to be a part of this patient's care.  Reghan Thul A, Pharm.D., BCPS Clinical Pharmacist 11/05/2017 2:23 PM

## 2017-11-05 NOTE — Progress Notes (Signed)
Name: Glenda Williams MRN: [id number] DOB: Oct 20, 1940     CONSULTATION DATE: 11/04/2017  Subjective & objective: Improved respiratory status and no major issues last night.   PAST MEDICAL HISTORY :   has a past medical history of A-fib ([place]), Arthritis, GERD (gastroesophageal reflux disease), Heart disease, Rheumatoid arthritis ([place]), Thyroid disease, and Tremors of nervous system.  has a past surgical history that includes Joint replacement (Bilateral); Carpal tunnel release (Bilateral); Colonoscopy (2006); Cholecystectomy; Replacement total knee (Left, 2005); Replacement total knee (Right, 1998); Brow lift (Bilateral, 05/23/2016); Breast biopsy (Left, 1988); and Breast biopsy (Left, 1987). Prior to Admission medications   Medication Sig Start Date End Date Taking? Authorizing Provider  albuterol (PROVENTIL HFA;VENTOLIN HFA) 108 (90 Base) MCG/ACT inhaler Inhale 2 puffs into the lungs every 6 (six) hours as needed for wheezing or shortness of breath. 11/02/17  Yes Alfred Levins, [place], MD  cholecalciferol (VITAMIN D) 400 units TABS tablet Take 400 Units by mouth daily.   Yes [provider]  EPINEPHrine 0.3 mg/0.3 mL IJ SOAJ injection Inject 0.3 mLs into the skin once as needed for anaphylaxis. 11/03/17  Yes [provider]  folic acid (FOLVITE) 1 MG tablet Take 1 mg by mouth daily.   Yes [provider]  hydrochlorothiazide (HYDRODIURIL) 12.5 MG tablet TAKE 1 TABLET BY MOUTH  DAILY 09/17/17  Yes Carles Collet M, PA-C  InFLIXimab (REMICADE IV) Inject into the vein. Every 6-8 weeks   Yes [provider]  levothyroxine (SYNTHROID, LEVOTHROID) 75 MCG tablet TAKE 1 TABLET BY MOUTH  DAILY 10/29/17  Yes Carles Collet M, PA-C  methotrexate (RHEUMATREX) 2.5 MG tablet Take 20 mg by mouth every Thursday.  10/25/13  Yes [provider]  Omega-3 Fatty Acids (FISH OIL) 1000 MG CAPS Take 1 capsule by mouth daily.   Yes [provider]  omeprazole (PRILOSEC)  20 MG capsule TAKE 1 CAPSULE BY MOUTH 2  TIMES DAILY BEFORE A MEAL. Patient taking differently: Take 20 mg by mouth 2 (two) times daily before a meal. TAKE 1 CAPSULE BY MOUTH 2  TIMES DAILY BEFORE A MEAL. 05/01/17  Yes Terrilee Croak, Adriana M, PA-C  potassium chloride SA (K-DUR,KLOR-CON) 20 MEQ tablet Take 1 tablet by mouth  daily 03/26/15  Yes Margarita Rana, MD  propranolol (INDERAL) 40 MG tablet Take 40-80 mg by mouth 2 (two) times daily. 2 tablets in the am, 1 tablet in pm   Yes [provider]  Red Yeast Rice 600 MG CAPS Take 1 capsule by mouth daily.   Yes [provider]  rivaroxaban (XARELTO) 20 MG TABS tablet TAKE 1 TABLET BY MOUTH ONCE DAILY 02/07/16  Yes [provider]  sertraline (ZOLOFT) 50 MG tablet TAKE 3 TABLETS BY MOUTH  DAILY 10/29/17  Yes Trinna Post, PA-C   Allergies  Allergen Reactions  . Bactrim [Sulfamethoxazole-Trimethoprim] Rash  . Macrobid [Nitrofurantoin Macrocrystal] Rash  . Mirabegron Rash  . Oxybutynin Rash  . Penicillins Rash    Has patient had a PCN reaction causing immediate rash, facial/tongue/throat swelling, SOB or lightheadedness with hypotension: Yes Has patient had a PCN reaction causing severe rash involving mucus membranes or skin necrosis: No Has patient had a PCN reaction that required hospitalization: No Has patient had a PCN reaction occurring within the last 10 years: Unknown If all of the above answers are "NO", then may proceed with Cephalosporin use.     FAMILY HISTORY:  family history includes Arthritis in her father; Bladder Cancer in her brother; Heart  disease in her father; Hypertension in her mother; Parkinson's disease in her brother and sister; Stroke in her mother. SOCIAL HISTORY:  reports that she has never smoked. She has never used smokeless tobacco. She reports that she does not drink alcohol or use drugs.  REVIEW OF SYSTEMS:   Unable to obtain due to critical illness   VITAL SIGNS: Temp:  [99 F  (37.2 C)-99.3 F (37.4 C)] 99.3 F (37.4 C) (09/02 0200) Pulse Rate:  [76-85] 78 (09/02 0700) Resp:  [16-27] 20 (09/02 0700) BP: (115-175)/(40-80) 131/77 (09/02 0700) SpO2:  [83 %-97 %] 96 % (09/02 1426) Weight:  [107.8 kg] 107.8 kg (09/02 0353)  Physical Examination:  Awake and oriented with no acute focal neurological deficits On nasal cannula 4 L/min, able to talk in full sentences and no respiratory distress, bilateral equal air entry and no rales S1 & S2 are audible with no murmur Benign abdominal exam with normal peristalsis No leg edema.  Improved Diffuse maculopapular skin rash  ASSESSMENT / PLAN: Acute respiratory failure.  Improved and tolerating nasal cannula -Monitor O2 sat and work of breathing  Pneumonitis with bilateral flame shaped opacity on CT scan and mild mediastinal adenopathy which is likely to be reactive considering the patient has drug allergy to Bactrim received within the last 48 hours with typical diffuse maculopapular rash. Pneumonia. Right lower consolidation with mild pulmonary congestion on chest x-ray.  Infective etiology cannot be ruled out with the patient history of being immune compromised on methotrexate. MRCA PCR -ve. -Taper steroids + antihistamines + H2 blockers -Empiric cefepime and DC levofloxacin -Monitor CXR + CBC + FiO2 -Procalcitonin <0.1 and cultures including respiratory viral panel and work-up for atypical pneumonia. -Gentle diuresis to improve lung compliance  Hypothyroidism. Free T4 0.94 -Optimize levothyroxine  Atrial fibrillation, CAD with anteroseptal ischemic changes on EKG. -Rate control and continue with Xarelto for anticoagulation -Aspirin -Follow-up with echocardiogram  Full code  DVT and GI prophylaxis.  Continue with supportive care  The patient was updated about her status and she agreed to the plan of care  Critical care time 35 minutes

## 2017-11-06 ENCOUNTER — Inpatient Hospital Stay: Payer: Medicare Other

## 2017-11-06 LAB — BASIC METABOLIC PANEL
Anion gap: 6 (ref 5–15)
BUN: 22 mg/dL (ref 8–23)
CO2: 35 mmol/L — ABNORMAL HIGH (ref 22–32)
Calcium: 8.8 mg/dL — ABNORMAL LOW (ref 8.9–10.3)
Chloride: 99 mmol/L (ref 98–111)
Creatinine, Ser: 0.76 mg/dL (ref 0.44–1.00)
GFR calc Af Amer: >60 mL/min (ref >60)
GFR calc non Af Amer: >60 mL/min (ref >60)
Glucose, Bld: 121 mg/dL — ABNORMAL HIGH (ref 70–99)
Potassium: 2.9 mmol/L — ABNORMAL LOW (ref 3.5–5.1)
SODIUM: 140 mmol/L (ref 135–145)

## 2017-11-06 LAB — LEGIONELLA PNEUMOPHILA SEROGP 1 UR AG: L. pneumophila Serogp 1 Ur Ag: NEGATIVE

## 2017-11-06 LAB — CALCIUM, IONIZED: Calcium, Ionized, Serum: 5 mg/dL (ref 4.5–5.6)

## 2017-11-06 LAB — MAGNESIUM: Magnesium: 1.8 mg/dL (ref 1.7–2.4)

## 2017-11-06 MED ORDER — METHYLPREDNISOLONE SODIUM SUCC 125 MG IJ SOLR
60.0000 mg | Freq: Every day | INTRAMUSCULAR | Status: DC
  Administered 2017-11-07 – 2017-11-08 (×2): 60 mg via INTRAVENOUS
  Filled 2017-11-06 (×2): qty 2

## 2017-11-06 MED ORDER — POTASSIUM CHLORIDE CRYS ER 20 MEQ PO TBCR
20.0000 meq | EXTENDED_RELEASE_TABLET | Freq: Once | ORAL | Status: AC
  Administered 2017-11-06: 20 meq via ORAL
  Filled 2017-11-06: qty 1

## 2017-11-06 MED ORDER — CEPHALEXIN 500 MG PO CAPS
500.0000 mg | ORAL_CAPSULE | Freq: Two times a day (BID) | ORAL | Status: DC
  Administered 2017-11-06 – 2017-11-08 (×4): 500 mg via ORAL
  Filled 2017-11-06 (×4): qty 1

## 2017-11-06 MED ORDER — SERTRALINE HCL 50 MG PO TABS: 150.0000 mg | ORAL_TABLET | Freq: Every day | ORAL | Status: DC

## 2017-11-06 MED ORDER — FAMOTIDINE 20 MG PO TABS
20.0000 mg | ORAL_TABLET | Freq: Two times a day (BID) | ORAL | Status: DC
  Administered 2017-11-06 – 2017-11-08 (×5): 20 mg via ORAL
  Filled 2017-11-06 (×5): qty 1

## 2017-11-06 MED ORDER — POTASSIUM CHLORIDE CRYS ER 20 MEQ PO TBCR
20.0000 meq | EXTENDED_RELEASE_TABLET | Freq: Two times a day (BID) | ORAL | Status: DC
  Administered 2017-11-06 – 2017-11-07 (×2): 20 meq via ORAL
  Filled 2017-11-06 (×2): qty 1

## 2017-11-06 MED ORDER — IPRATROPIUM-ALBUTEROL 0.5-2.5 (3) MG/3ML IN SOLN
3.0000 mL | Freq: Two times a day (BID) | RESPIRATORY_TRACT | Status: DC
  Administered 2017-11-06 – 2017-11-08 (×4): 3 mL via RESPIRATORY_TRACT
  Filled 2017-11-06 (×5): qty 3

## 2017-11-06 MED ORDER — IPRATROPIUM-ALBUTEROL 0.5-2.5 (3) MG/3ML IN SOLN: 3.0000 mL | RESPIRATORY_TRACT | Status: DC | PRN

## 2017-11-06 NOTE — Plan of Care (Signed)
  Problem: Education: Goal: Knowledge of General Education information will improve Description Including pain rating scale, medication(s)/side effects and non-pharmacologic comfort measures Outcome: Progressing   Problem: Clinical Measurements: Goal: Will remain free from infection Outcome: Progressing Goal: Respiratory complications will improve Outcome: Progressing   Problem: Coping: Goal: Level of anxiety will decrease Outcome: Progressing   Problem: Skin Integrity: Goal: Risk for impaired skin integrity will decrease Outcome: Progressing   

## 2017-11-06 NOTE — Evaluation (Signed)
Physical Therapy Evaluation Patient Details Name: Glenda Williams MRN: [id number] DOB: June 11, 1940 Today's Date: 11/06/2017   History of Present Illness  presented to ER secondary to worsening rash, SOB; admitted with acute hypoxic respiratory failure and acute allergic reactions to Bactrim.  Initially requiring BiPAP; now weaned to 2L.  Clinical Impression  Upon evaluation, patient alert and oriented; follows all commands and demonstrates good effort with all mobility tasks.  Eager for [place] activities as tolerated.  Able to complete bed mobility with mod indep; sit/stand, basic transfers and gait (180') with RW, cga/close sup.  Decreased cadence and overall gait speed, but no overt buckling, LOB or safety concern noted. Maintains sats >90% at rest and with exertion on RA; mild SOB with activity.  Do recommend continued use of RW for overall energy conservation during acute recovery.  Patient voices understanding and agreement. Will maintain on caseload throughout remaining hospitalization to promote continued mobility/endurance training; anticipate no formal PT needs upon discharge.    Follow Up Recommendations No PT follow up(will maintain on caseload throughout hospitalization; anticipate no formal PT needs upon discharge)    Equipment Recommendations       Recommendations for Other Services       Precautions / Restrictions Precautions Precautions: Fall Restrictions Weight Bearing Restrictions: No      Mobility  Bed Mobility Overal bed mobility: Modified Independent                Transfers Overall transfer level: Needs assistance Equipment used: Rolling walker (2 wheeled) Transfers: Sit to/from Stand Sit to Stand: Supervision            Ambulation/Gait Ambulation/Gait assistance: Min guard;Supervision Gait Distance (Feet): 180 Feet Assistive device: Rolling walker (2 wheeled)       General Gait Details: decreased cadence and gait speed, but fair/good stability  with no buckling or LOB.  Mild SOB, but maintains sats >90% on RA with exertion. Do recommend continued use of RW for overall energy conservation during acute recovery.  Stairs            Wheelchair Mobility    Modified Rankin (Stroke Patients Only)       Balance Overall balance assessment: Needs assistance Sitting-balance support: No upper extremity supported;Feet supported Sitting balance-Leahy Scale: Good     Standing balance support: Bilateral upper extremity supported Standing balance-Leahy Scale: Fair                               Pertinent Vitals/Pain Pain Assessment: No/denies pain    Home Living Family/patient expects to be discharged to:: Private residence Living Arrangements: Spouse/significant other Available Help at Discharge: Family Type of Home: House Home Access: Stairs to enter   Technical brewer of Steps: 1 Home Layout: One level Home Equipment: Cane - single point      Prior Function Level of Independence: Independent with assistive device(s)         Comments: Mod indep for ADLs and household distances without assist device; SPC "on days my RA acts up" and for longer, community distances.  Denies recent fall history (last fall x3 years ago).  No home O2.     Hand Dominance        Extremity/Trunk Assessment   Upper Extremity Assessment Upper Extremity Assessment: Overall WFL for tasks assessed    Lower Extremity Assessment Lower Extremity Assessment: Overall WFL for tasks assessed       Communication   Communication:  No difficulties  Cognition Arousal/Alertness: Awake/alert Behavior During Therapy: WFL for tasks assessed/performed Overall Cognitive Status: Within Functional Limits for tasks assessed                                        General Comments      Exercises Other Exercises Other Exercises: Toilet transfer, ambulatory with RW, close sup; sit/stand from standard toilet with  RW/grab bar, close sup.  Standing balance for hygiene, clothing management and hand hygiene, close sup.   Assessment/Plan    PT Assessment Patient needs continued PT services  PT Problem List Decreased activity tolerance;Decreased balance;Decreased mobility;Cardiopulmonary status limiting activity       PT Treatment Interventions DME instruction;Gait training;Stair training;Functional mobility training;Balance training;Therapeutic exercise;Therapeutic activities;Patient/family education    PT Goals (Current goals can be found in the Care Plan section)  Acute Rehab PT Goals Patient Stated Goal: to move around a little, then get a good nap PT Goal Formulation: With patient Time For Goal Achievement: 11/20/17 Potential to Achieve Goals: Good    Frequency Min 2X/week   Barriers to discharge        Co-evaluation               AM-PAC PT "6 Clicks" Daily Activity  Outcome Measure Difficulty turning over in bed (including adjusting bedclothes, sheets and blankets)?: None Difficulty moving from lying on back to sitting on the side of the bed? : None Difficulty sitting down on and standing up from a chair with arms (e.g., wheelchair, bedside commode, etc,.)?: A Little Help needed moving to and from a bed to chair (including a wheelchair)?: A Little Help needed walking in hospital room?: A Little Help needed climbing 3-5 steps with a railing? : A Little 6 Click Score: 20    End of Session Equipment Utilized During Treatment: Gait belt Activity Tolerance: Patient tolerated treatment well Patient left: in chair;with call bell/phone within reach;with chair alarm set Nurse Communication: Mobility status PT Visit Diagnosis: Muscle weakness (generalized) (M62.81);Difficulty in walking, not elsewhere classified (R26.2)    Time: 8338-2505 PT Time Calculation (min) (ACUTE ONLY): 36 min   Charges:   PT Evaluation $PT Eval Low Complexity: 1 Low PT Treatments $Therapeutic Activity:  8-22 mins        Areal Cochrane H. Owens Shark, PT, DPT, NCS 11/06/17, 1:28 PM [phone]

## 2017-11-06 NOTE — Progress Notes (Signed)
[place] at [place] NAME: Glenda Williams    MR#:  [id number]  DATE OF BIRTH:  12/20/1940  SUBJECTIVE:  CHIEF COMPLAINT:   Chief Complaint  Patient presents with  . Shortness of Breath   - Off BiPAP.  Breathing is improving. On nasal canula oxygen now. -Rash on her trunk is slowly fading  REVIEW OF SYSTEMS:  Review of Systems  Constitutional: Negative for chills, fever and malaise/fatigue.  HENT: Negative for congestion, ear discharge, hearing loss and nosebleeds.   Eyes: Negative for blurred vision.  Respiratory: Positive for shortness of breath. Negative for cough and wheezing.   Cardiovascular: Negative for chest pain and palpitations.  Gastrointestinal: Negative for abdominal pain, constipation, diarrhea, nausea and vomiting.  Genitourinary: Negative for dysuria and urgency.  Musculoskeletal: Positive for myalgias.  Skin: Positive for rash.  Neurological: Negative for dizziness, focal weakness, seizures and headaches.  Psychiatric/Behavioral: Negative for depression.    DRUG ALLERGIES:   Allergies  Allergen Reactions  . Bactrim [Sulfamethoxazole-Trimethoprim] Rash  . Macrobid [Nitrofurantoin Macrocrystal] Rash  . Mirabegron Rash  . Oxybutynin Rash  . Penicillins Rash    Has patient had a PCN reaction causing immediate rash, facial/tongue/throat swelling, SOB or lightheadedness with hypotension: Yes Has patient had a PCN reaction causing severe rash involving mucus membranes or skin necrosis: No Has patient had a PCN reaction that required hospitalization: No Has patient had a PCN reaction occurring within the last 10 years: Unknown If all of the above answers are "NO", then may proceed with Cephalosporin use.     VITALS:  Blood pressure 130/77, pulse 68, temperature 98 F (36.7 C), temperature source Oral, resp. rate 18, height 5\' 6"  (1.676 m), weight 106.7 kg, SpO2 91 %.  PHYSICAL EXAMINATION:  Physical  Exam  GENERAL:  77 y.o.-year-old patient lying in the bed with no acute distress.  EYES: Pupils equal, round, reactive to light and accommodation. No scleral icterus. Extraocular muscles intact.  HEENT: Head atraumatic, normocephalic. Oropharynx and nasopharynx clear.  NECK:  Supple, no jugular venous distention. No thyroid enlargement, no tenderness.  LUNGS: Normal breath sounds bilaterally, no wheezing, rales,rhonchi or crepitation. No use of accessory muscles of respiration.  CARDIOVASCULAR: S1, S2 normal. No  rubs, or gallops. 3/6 systolic murmur is present ABDOMEN: Soft, nontender, nondistended. Bowel sounds present. No organomegaly or mass.  EXTREMITIES: No pedal edema, cyanosis, or clubbing.  NEUROLOGIC: Cranial nerves II through XII are intact. Muscle strength 5/5 in all extremities. Sensation intact. Gait not checked.  PSYCHIATRIC: The patient is alert and oriented x 3.  SKIN: significant maculopapular rash all over her trunk, back, and upper part of legs noted.    LABORATORY PANEL:   CBC Recent Labs  Lab 11/05/17 0143  WBC 32.9*  HGB 15.9  HCT 47.3*  PLT 250   ------------------------------------------------------------------------------------------------------------------  Chemistries  Recent Labs  Lab 11/04/17 1136  11/06/17 0420  NA 136   < > 140  K 4.2   < > 2.9*  CL 97*   < > 99  CO2 30   < > 35*  GLUCOSE 184*   < > 121*  BUN 15   < > 22  CREATININE 0.78   < > 0.76  CALCIUM 8.8*   < > 8.8*  MG  --    < > 1.8  AST 30  --   --   ALT 24  --   --   ALKPHOS 65  --   --  BILITOT 1.3*  --   --    < > = values in this interval not displayed.   ------------------------------------------------------------------------------------------------------------------  Cardiac Enzymes Recent Labs  Lab 11/05/17 0143  TROPONINI 0.03*   ------------------------------------------------------------------------------------------------------------------  RADIOLOGY:  Dg  Chest Port 1 View  Result Date: 11/06/2017 CLINICAL DATA:  Pneumonia. EXAM: PORTABLE CHEST 1 VIEW COMPARISON:  11/04/2017. FINDINGS: Mediastinum and hilar structures normal. Stable cardiomegaly with normal pulmonary vascularity. Near complete clearing of bilateral pulmonary infiltrates with mild residual. No pleural effusion or pneumothorax. IMPRESSION: 1. Near complete clearing of bilateral pulmonary infiltrates with mild residual. 2.  Stable cardiomegaly. Electronically Signed   By: Marcello Moores  Register   On: 11/06/2017 07:04    EKG:   Orders placed or performed during the hospital encounter of 11/04/17  . EKG 12-Lead  . EKG 12-Lead  . ED EKG  . ED EKG    ASSESSMENT AND PLAN:   77 year old female with past medical history significant for A. fib on Xarelto, CAD, rheumatoid arthritis, hypothyroidism and GERD presents from hospital secondary to difficulty breathing and also significant rash all over her body.  1.  Acute hypoxic respiratory failure- -likely acute pneumonitis secondary to allergic reaction to Bactrim. -Appreciate pulmonary consult.  CT with significant opacities and mediastinal adenopathy noted. -Continue steroids, antihistamines at this time -Empiric antibiotics with cefepime due to leukocytosis.  - On admission on BiPAP, not on home oxygen.  Wean to nasal cannula as tolerated. - Respiratory viral panel is Negative. - Improved, Try to taper to room air now on ambulation.  2.  Maculopapular rash-likely secondary to sulfa allergy.  Diffuse maculopapular rash noted on upper part of the legs, trunk, palms and back. - Continue treatment with steroids, antihistamines for now.  3.  Leukocytosis-no other source of infection noted.  Being treated for infection/pneumonitis with cefepime and also on steroids. - Monitor closely.  Denies any diarrhea. - Procalcitonin is negative - Change to Oral keflex for 3 days for UTI.  4.  Atrial fibrillation-rate controlled On Xarelto for  anticoagulation.  5.  Hypothyroidism-continue Synthroid  6.  DVT prophylaxis-patient already on Xarelto  Baseline ambulates with a cane.  Allow to ambulate in room.   All the records are reviewed and case discussed with Care Management/Social Workerr. Management plans discussed with the patient, family and they are in agreement.  CODE STATUS: Full Code  TOTAL TIME TAKING CARE OF THIS PATIENT: 39 minutes.   POSSIBLE D/C IN 2 DAYS, DEPENDING ON CLINICAL CONDITION.   Vaughan Basta M.D on 11/06/2017 at 2:00 PM  Between 7am to 6pm - Pager - [phone]  After 6pm go to [email or web address] - password [email or web address] [place] [facility]  Office  [phone]  CC: Primary care physician; Trinna Post, PA-C

## 2017-11-06 NOTE — Progress Notes (Addendum)
Report called to [facility] RN on 1C.Tranferred to 1C via wheelchair with oxygen at 2 liters.

## 2017-11-07 LAB — CBC
HCT: 46.4 % (ref 35.0–47.0)
Hemoglobin: 15.6 g/dL (ref 12.0–16.0)
MCH: 30.6 pg (ref 26.0–34.0)
MCHC: 33.5 g/dL (ref 32.0–36.0)
MCV: 91.3 fL (ref 80.0–100.0)
PLATELETS: 302 10*3/uL (ref 150–440)
RBC: 5.08 MIL/uL (ref 3.80–5.20)
RDW: 14.4 % (ref 11.5–14.5)
WBC: 25.5 10*3/uL — ABNORMAL HIGH (ref 3.6–11.0)

## 2017-11-07 LAB — BASIC METABOLIC PANEL
Anion gap: 9 (ref 5–15)
BUN: 21 mg/dL (ref 8–23)
CALCIUM: 8.9 mg/dL (ref 8.9–10.3)
CO2: 35 mmol/L — AB (ref 22–32)
CREATININE: 0.73 mg/dL (ref 0.44–1.00)
Chloride: 98 mmol/L (ref 98–111)
GFR calc Af Amer: >60 mL/min (ref >60)
GFR calc non Af Amer: >60 mL/min (ref >60)
GLUCOSE: 100 mg/dL — AB (ref 70–99)
Potassium: 4.4 mmol/L (ref 3.5–5.1)
Sodium: 142 mmol/L (ref 135–145)

## 2017-11-07 LAB — CULTURE, BLOOD (ROUTINE X 2)
Culture: NO GROWTH
Culture: NO GROWTH

## 2017-11-07 MED ORDER — PROPRANOLOL HCL 40 MG PO TABS
80.0000 mg | ORAL_TABLET | Freq: Every morning | ORAL | Status: DC
  Administered 2017-11-07 – 2017-11-08 (×2): 80 mg via ORAL
  Filled 2017-11-07 (×2): qty 2

## 2017-11-07 MED ORDER — LEVOTHYROXINE SODIUM 50 MCG PO TABS: 50.0000 ug | ORAL_TABLET | Freq: Every day | ORAL | Status: DC

## 2017-11-07 MED ORDER — POTASSIUM CHLORIDE CRYS ER 20 MEQ PO TBCR
20.0000 meq | EXTENDED_RELEASE_TABLET | Freq: Every day | ORAL | Status: DC
  Administered 2017-11-08: 10:00:00 20 meq via ORAL
  Filled 2017-11-07: qty 1

## 2017-11-07 MED ORDER — LEVOTHYROXINE SODIUM 100 MCG PO TABS
100.0000 ug | ORAL_TABLET | Freq: Every day | ORAL | Status: DC
  Administered 2017-11-08: 100 ug via ORAL
  Filled 2017-11-07: qty 1

## 2017-11-07 NOTE — Progress Notes (Signed)
[place] at [place] NAME: Glenda Williams    MR#:  [id number]  DATE OF BIRTH:  Jun 07, 1940  SUBJECTIVE:  CHIEF COMPLAINT:   Chief Complaint  Patient presents with  . Shortness of Breath   - Off BiPAP.  Breathing is improving. On nasal canula oxygen now. -Rash on her trunk is slowly fading  Had some bradycardia episodes on tele and also A fib.  REVIEW OF SYSTEMS:  Review of Systems  Constitutional: Negative for chills, fever and malaise/fatigue.  HENT: Negative for congestion, ear discharge, hearing loss and nosebleeds.   Eyes: Negative for blurred vision.  Respiratory: Positive for shortness of breath. Negative for cough and wheezing.   Cardiovascular: Negative for chest pain and palpitations.  Gastrointestinal: Negative for abdominal pain, constipation, diarrhea, nausea and vomiting.  Genitourinary: Negative for dysuria and urgency.  Musculoskeletal: Positive for myalgias.  Skin: Positive for rash.  Neurological: Negative for dizziness, focal weakness, seizures and headaches.  Psychiatric/Behavioral: Negative for depression.    DRUG ALLERGIES:   Allergies  Allergen Reactions  . Bactrim [Sulfamethoxazole-Trimethoprim] Rash  . Macrobid [Nitrofurantoin Macrocrystal] Rash  . Mirabegron Rash  . Oxybutynin Rash  . Penicillins Rash    Has patient had a PCN reaction causing immediate rash, facial/tongue/throat swelling, SOB or lightheadedness with hypotension: Yes Has patient had a PCN reaction causing severe rash involving mucus membranes or skin necrosis: No Has patient had a PCN reaction that required hospitalization: No Has patient had a PCN reaction occurring within the last 10 years: Unknown If all of the above answers are "NO", then may proceed with Cephalosporin use.     VITALS:  Blood pressure 140/75, pulse 63, temperature 97.9 F (36.6 C), resp. rate 18, height 5\' 6"  (1.676 m), weight 107 kg, SpO2 92 %.  PHYSICAL  EXAMINATION:  Physical Exam  GENERAL:  77 y.o.-year-old patient lying in the bed with no acute distress.  EYES: Pupils equal, round, reactive to light and accommodation. No scleral icterus. Extraocular muscles intact.  HEENT: Head atraumatic, normocephalic. Oropharynx and nasopharynx clear.  NECK:  Supple, no jugular venous distention. No thyroid enlargement, no tenderness.  LUNGS: Normal breath sounds bilaterally, no wheezing, rales,rhonchi or crepitation. No use of accessory muscles of respiration.  CARDIOVASCULAR: S1, S2 normal. No  rubs, or gallops. 3/6 systolic murmur is present ABDOMEN: Soft, nontender, nondistended. Bowel sounds present. No organomegaly or mass.  EXTREMITIES: No pedal edema, cyanosis, or clubbing.  NEUROLOGIC: Cranial nerves II through XII are intact. Muscle strength 5/5 in all extremities. Sensation intact. Gait not checked.  PSYCHIATRIC: The patient is alert and oriented x 3.  SKIN: significant maculopapular rash all over her trunk, back, and upper part of legs noted.    LABORATORY PANEL:   CBC Recent Labs  Lab 11/07/17 1047  WBC 25.5*  HGB 15.6  HCT 46.4  PLT 302   ------------------------------------------------------------------------------------------------------------------  Chemistries  Recent Labs  Lab 11/04/17 1136  11/06/17 0420 11/07/17 0326  NA 136   < > 140 142  K 4.2   < > 2.9* 4.4  CL 97*   < > 99 98  CO2 30   < > 35* 35*  GLUCOSE 184*   < > 121* 100*  BUN 15   < > 22 21  CREATININE 0.78   < > 0.76 0.73  CALCIUM 8.8*   < > 8.8* 8.9  MG  --    < > 1.8  --   AST 30  --   --   --  ALT 24  --   --   --   ALKPHOS 65  --   --   --   BILITOT 1.3*  --   --   --    < > = values in this interval not displayed.   ------------------------------------------------------------------------------------------------------------------  Cardiac Enzymes Recent Labs  Lab 11/05/17 0143  TROPONINI 0.03*    ------------------------------------------------------------------------------------------------------------------  RADIOLOGY:  Dg Chest Port 1 View  Result Date: 11/06/2017 CLINICAL DATA:  Pneumonia. EXAM: PORTABLE CHEST 1 VIEW COMPARISON:  11/04/2017. FINDINGS: Mediastinum and hilar structures normal. Stable cardiomegaly with normal pulmonary vascularity. Near complete clearing of bilateral pulmonary infiltrates with mild residual. No pleural effusion or pneumothorax. IMPRESSION: 1. Near complete clearing of bilateral pulmonary infiltrates with mild residual. 2.  Stable cardiomegaly. Electronically Signed   By: Marcello Moores  Register   On: 11/06/2017 07:04    EKG:   Orders placed or performed during the hospital encounter of 11/04/17  . EKG 12-Lead  . EKG 12-Lead  . ED EKG  . ED EKG    ASSESSMENT AND PLAN:   77 year old female with past medical history significant for A. fib on Xarelto, CAD, rheumatoid arthritis, hypothyroidism and GERD presents from hospital secondary to difficulty breathing and also significant rash all over her body.  1.  Acute hypoxic respiratory failure- -likely acute pneumonitis secondary to allergic reaction to Bactrim. -Appreciate pulmonary consult.  CT with significant opacities and mediastinal adenopathy noted. -Continue steroids, antihistamines at this time -Empiric antibiotics with cefepime due to leukocytosis.  - On admission on BiPAP, not on home oxygen.  Wean to nasal cannula as tolerated. - Respiratory viral panel is Negative. -  taper to room air now on ambulation.  2.  Maculopapular rash-likely secondary to sulfa allergy.  Diffuse maculopapular rash noted on upper part of the legs, trunk, palms and back. - Continue treatment with steroids, antihistamines for now.  3.  Leukocytosis-no other source of infection noted.  Being treated for infection/pneumonitis with cefepime and also on steroids. - Monitor closely.  Denies any diarrhea. - Procalcitonin  is negative - Change to Oral keflex for 3 days for UTI.  4.  Atrial fibrillation-rate controlled On Xarelto for anticoagulation.  5.  Hypothyroidism-continue Synthroid   Increased dose of Levothyroxine to 100 mcg due to high TSH.  6.  DVT prophylaxis-patient already on Xarelto  7. Sinus bradycardia   Called cardio consult- advise to cont same meds as asymptomatic, monitor. Baseline ambulates with a cane.  Allow to ambulate in room.   All the records are reviewed and case discussed with Care Management/Social Workerr. Management plans discussed with the patient, family and they are in agreement.  CODE STATUS: Full Code  TOTAL TIME TAKING CARE OF THIS PATIENT: 39 minutes.   POSSIBLE D/C IN 2 DAYS, DEPENDING ON CLINICAL CONDITION.   Vaughan Basta M.D on 11/07/2017 at 2:31 PM  Between 7am to 6pm - Pager - [phone]  After 6pm go to [email or web address] - password [email or web address] [place] [facility]  Office  [phone]  CC: Primary care physician; Trinna Post, PA-C

## 2017-11-07 NOTE — Consult Note (Signed)
[facility] Consultation Note  Patient ID: Glenda Williams, MRN: [id number], DOB/AGE: 1940/09/05 77 y.o. Admit date: 11/04/2017   Date of Consult: 11/07/2017 Primary Physician: Paulene Floor Primary Cardiologist: Ubaldo Glassing  Chief Complaint:  Chief Complaint  Patient presents with  . Shortness of Breath   Reason for Consult: Atrial fibrillation  HPI: 77 y.o. female with known chronic nonvalvular atrial fibrillation appropriately treated with heart rate control with propranolol which also helps her tremor.  She has been on anticoagulation without any bleeding complications.  Recently she had a respiratory failure and bronchitis for which the patient needed antibiotics.  She was placed on antibiotics and ended up with a significant side effects of the medication and was admitted to the hospital.  At that time she has had telemetry for atrial fibrillation for which she has shown overall reasonable heart rate control.  She did have some sick sinus syndrome changes most consistent with a maximum RR interval of 2 seconds for which has been asymptomatic the whole time.  She has not had any syncope dizziness nausea diaphoresis or other cardiac symptoms at this time.  She is improving from her symptoms of respiratory failure  Past Medical History:  Diagnosis Date  . A-fib ([place])   . Arthritis   . GERD (gastroesophageal reflux disease)   . Heart disease   . Rheumatoid arthritis [place]   . Thyroid disease   . Tremors of nervous system       Surgical History:  Past Surgical History:  Procedure Laterality Date  . BREAST BIOPSY Left 1988   Benign  . BREAST BIOPSY Left 1987   Benign  . BROW LIFT Bilateral 05/23/2016   Procedure: BLEPHAROPLASTY upper eyelid; w/excess skin;  Surgeon: Karle Starch, MD;  Location: [place];  Service: Ophthalmology;  Laterality: Bilateral;  . CARPAL TUNNEL RELEASE Bilateral   . CHOLECYSTECTOMY     Dr. Bary Castilla  . COLONOSCOPY  2006  . JOINT  REPLACEMENT Bilateral    knees  . REPLACEMENT TOTAL KNEE Left 2005  . REPLACEMENT TOTAL KNEE Right 1998     Home Meds: Prior to Admission medications   Medication Sig Start Date End Date Taking? Authorizing Provider  albuterol (PROVENTIL HFA;VENTOLIN HFA) 108 (90 Base) MCG/ACT inhaler Inhale 2 puffs into the lungs every 6 (six) hours as needed for wheezing or shortness of breath. 11/02/17  Yes Alfred Levins, [place], MD  cholecalciferol (VITAMIN D) 400 units TABS tablet Take 400 Units by mouth daily.   Yes [provider]  EPINEPHrine 0.3 mg/0.3 mL IJ SOAJ injection Inject 0.3 mLs into the skin once as needed for anaphylaxis. 11/03/17  Yes [provider]  folic acid (FOLVITE) 1 MG tablet Take 1 mg by mouth daily.   Yes [provider]  hydrochlorothiazide (HYDRODIURIL) 12.5 MG tablet TAKE 1 TABLET BY MOUTH  DAILY 09/17/17  Yes Carles Collet M, PA-C  InFLIXimab (REMICADE IV) Inject into the vein. Every 6-8 weeks   Yes [provider]  levothyroxine (SYNTHROID, LEVOTHROID) 75 MCG tablet TAKE 1 TABLET BY MOUTH  DAILY 10/29/17  Yes Carles Collet M, PA-C  methotrexate (RHEUMATREX) 2.5 MG tablet Take 20 mg by mouth every Thursday.  10/25/13  Yes [provider]  Omega-3 Fatty Acids (FISH OIL) 1000 MG CAPS Take 1 capsule by mouth daily.   Yes [provider]  omeprazole (PRILOSEC) 20 MG capsule TAKE 1 CAPSULE BY MOUTH 2  TIMES DAILY BEFORE A MEAL. Patient taking differently: Take  20 mg by mouth 2 (two) times daily before a meal. TAKE 1 CAPSULE BY MOUTH 2  TIMES DAILY BEFORE A MEAL. 05/01/17  Yes Terrilee Croak, Adriana M, PA-C  potassium chloride SA (K-DUR,KLOR-CON) 20 MEQ tablet Take 1 tablet by mouth  daily 03/26/15  Yes Margarita Rana, MD  propranolol (INDERAL) 40 MG tablet Take 40-80 mg by mouth 2 (two) times daily. 2 tablets in the am, 1 tablet in pm   Yes [provider]  Red Yeast Rice 600 MG CAPS Take 1 capsule by mouth daily.   Yes [provider]  rivaroxaban (XARELTO) 20 MG TABS tablet TAKE 1 TABLET BY MOUTH ONCE DAILY 02/07/16  Yes [provider]  sertraline (ZOLOFT) 50 MG tablet TAKE 3 TABLETS BY MOUTH  DAILY 10/29/17  Yes Trinna Post, PA-C    Inpatient Medications:  . budesonide (PULMICORT) nebulizer solution  0.5 mg Nebulization BID  . cephALEXin  500 mg Oral Q12H  . chlorhexidine  15 mL Mouth Rinse BID  . famotidine  20 mg Oral Q12H  . folic acid  1 mg Oral Daily  . guaiFENesin  600 mg Oral BID  . hydrochlorothiazide  12.5 mg Oral Daily  . ipratropium-albuterol  3 mL Nebulization BID  . [START ON 11/08/2017] levothyroxine  100 mcg Oral Daily  . mouth rinse  15 mL Mouth Rinse q12n4p  . methylPREDNISolone (SOLU-MEDROL) injection  60 mg Intravenous Daily  . multivitamin with minerals  1 tablet Oral Daily  . potassium chloride SA  20 mEq Oral BID  . propranolol  40 mg Oral QHS  . rivaroxaban  20 mg Oral Daily  . sertraline  150 mg Oral Daily  . sodium chloride flush  3 mL Intravenous Q12H   . sodium chloride      Allergies:  Allergies  Allergen Reactions  . Bactrim [Sulfamethoxazole-Trimethoprim] Rash  . Macrobid [Nitrofurantoin Macrocrystal] Rash  . Mirabegron Rash  . Oxybutynin Rash  . Penicillins Rash    Has patient had a PCN reaction causing immediate rash, facial/tongue/throat swelling, SOB or lightheadedness with hypotension: Yes Has patient had a PCN reaction causing severe rash involving mucus membranes or skin necrosis: No Has patient had a PCN reaction that required hospitalization: No Has patient had a PCN reaction occurring within the last 10 years: Unknown If all of the above answers are "NO", then may proceed with Cephalosporin use.     Social History   Socioeconomic History  . Marital status: Married    Spouse name: Not on file  . Number of children: 2  . Years of education: Not on file  . Highest education level: Some college, no degree  Occupational History  .  Not on file  Social Needs  . Financial resource strain: Not hard at all  . Food insecurity:    Worry: Never true    Inability: Never true  . Transportation needs:    Medical: No    Non-medical: No  Tobacco Use  . Smoking status: Never Smoker  . Smokeless tobacco: Never Used  Substance and Sexual Activity  . Alcohol use: No  . Drug use: No  . Sexual activity: Not on file  Lifestyle  . Physical activity:    Days per week: Not on file    Minutes per session: Not on file  . Stress: Not at all  Relationships  . Social connections:    Talks on phone: Not on file    Gets together: Not on file  Attends religious service: Not on file    Active member of club or organization: Not on file    Attends meetings of clubs or organizations: Not on file    Relationship status: Not on file  . Intimate partner violence:    Fear of current or ex partner: Not on file    Emotionally abused: Not on file    Physically abused: Not on file    Forced sexual activity: Not on file  Other Topics Concern  . Not on file  Social History Narrative  . Not on file     Family History  Problem Relation Age of Onset  . Stroke Mother   . Hypertension Mother   . Heart disease Father   . Arthritis Father   . Parkinson's disease Sister   . Parkinson's disease Brother   . Bladder Cancer Brother   . Breast cancer Neg Hx   . Kidney cancer Neg Hx      Review of Systems Positive for cough congestion Negative for: General:  chills, fever, night sweats or weight changes.  Cardiovascular: PND orthopnea syncope dizziness  Dermatological skin lesions rashes Respiratory: As noted for cough congestion Urologic: Frequent urination urination at night and hematuria Abdominal: negative for nausea, vomiting, diarrhea, bright red blood per rectum, melena, or hematemesis Neurologic: negative for visual changes, and/or hearing changes  All other systems reviewed and are otherwise negative except as noted  above.  Labs: Recent Labs    11/04/17 1437 11/04/17 1938 11/05/17 0143  TROPONINI 0.07* <0.03 0.03*   Lab Results  Component Value Date   WBC 25.5 (H) 11/07/2017   HGB 15.6 11/07/2017   HCT 46.4 11/07/2017   MCV 91.3 11/07/2017   PLT 302 11/07/2017    Recent Labs  Lab 11/04/17 1136  11/07/17 0326  NA 136   < > 142  K 4.2   < > 4.4  CL 97*   < > 98  CO2 30   < > 35*  BUN 15   < > 21  CREATININE 0.78   < > 0.73  CALCIUM 8.8*   < > 8.9  PROT 7.9  --   --   BILITOT 1.3*  --   --   ALKPHOS 65  --   --   ALT 24  --   --   AST 30  --   --   GLUCOSE 184*   < > 100*   < > = values in this interval not displayed.   Lab Results  Component Value Date   CHOL 168 06/13/2016   HDL 41 06/13/2016   LDLCALC 110 (H) 06/13/2016   TRIG 84 06/13/2016   No results found for: DDIMER  Radiology/Studies:  Ct Angio Chest Pe W And/or Wo Contrast  Result Date: 11/04/2017 CLINICAL DATA:  Short of breath. Hypoxia. Recent drug reaction to Bactrim. Patient receiving immunotherapy for rheumatoid arthritis EXAM: CT ANGIOGRAPHY CHEST WITH CONTRAST TECHNIQUE: Multidetector CT imaging of the chest was performed using the standard protocol during bolus administration of intravenous contrast. Multiplanar CT image reconstructions and MIPs were obtained to evaluate the vascular anatomy. CONTRAST:  32mL OMNIPAQUE IOHEXOL 350 MG/ML SOLN COMPARISON:  11/04/2017 FINDINGS: Cardiovascular: No filling defects within the pulmonary arteries to suggest acute pulmonary embolism. No acute findings of the aorta or great vessels. No pericardial fluid. Coronary artery calcification and aortic atherosclerotic calcification. Mediastinum/Nodes: No axillary supraclavicular adenopathy. No mediastinal hilar adenopathy. Borderline enlarged RIGHT lower paratracheal lymph node is measures 10  mm short axis subcarinal node measures 11 mm short axis. Lungs/Pleura: Bilateral dense ground-glass opacities which are flame shaped and noted  throughout the LEFT and RIGHT lung in a somewhat perihilar distribution (coronal image 48/7). No discrete measurable nodularity. There is mild the thickening of the associated bronchials. No bronchiectasis. No pleural fluid. Example opacity in the medial RIGHT middle lobe measuring 3.5 by 2.3 cm. Example opacity in the lingula measuring 2.8 by 1.9 cm Upper Abdomen: Limited view of the liver, kidneys, pancreas are unremarkable. Normal adrenal glands. Musculoskeletal: No aggressive osseous lesion. Degenerative osteophytosis of the spine. Review of the MIP images confirms the above findings. IMPRESSION: 1. Bilateral flame shaped dense ground-glass opacities do not have a pattern of typical of pulmonary infection. Mild bronchial thickening associated with the predominately centralized pulmonary opacities. Finding are more suggestive of inflammatory process or drug reaction. Recommend follow-up high-resolution CT if symptoms do not improve following acute exacerbation. 2. Mild mediastinal adenopathy is likely reactive. 3. No evidence of acute pulmonary embolism. Electronically Signed   By: Suzy Bouchard M.D.   On: 11/04/2017 13:43   Dg Chest Port 1 View  Result Date: 11/06/2017 CLINICAL DATA:  Pneumonia. EXAM: PORTABLE CHEST 1 VIEW COMPARISON:  11/04/2017. FINDINGS: Mediastinum and hilar structures normal. Stable cardiomegaly with normal pulmonary vascularity. Near complete clearing of bilateral pulmonary infiltrates with mild residual. No pleural effusion or pneumothorax. IMPRESSION: 1. Near complete clearing of bilateral pulmonary infiltrates with mild residual. 2.  Stable cardiomegaly. Electronically Signed   By: Marcello Moores  Register   On: 11/06/2017 07:04   Dg Chest Port 1 View  Result Date: 11/04/2017 CLINICAL DATA:  Dyspnea and shortness of breat. EXAM: PORTABLE CHEST 1 VIEW COMPARISON:  PA and lateral chest 05/10/2017 and 06/13/2016. FINDINGS: There is cardiomegaly and mild interstitial edema. More focal  airspace disease is seen in the right lung base. No pneumothorax or pleural effusion. Aortic atherosclerosis noted. No focal bony abnormality. IMPRESSION: Cardiomegaly and mild interstitial edema. More focal airspace disease in the right lung base could be due to atelectasis or pneumonia. Electronically Signed   By: Inge Rise M.D.   On: 11/04/2017 12:14    EKG: Atrial fibrillation with controlled ventricular rate nonspecific ST changes  Weights: Filed Weights   11/06/17 0500 11/06/17 0915 11/07/17 0421  Weight: 105.9 kg 106.7 kg 107 kg     Physical Exam: Blood pressure 140/75, pulse 63, temperature 97.9 F (36.6 C), resp. rate 18, height 5\' 6"  (1.676 m), weight 107 kg, SpO2 92 %. Body mass index is 38.09 kg/m. General: Well developed, well nourished, in no acute distress. Head eyes ears nose throat: Normocephalic, atraumatic, sclera non-icteric, no xanthomas, nares are without discharge. No apparent thyromegaly and/or mass  Lungs: Normal respiratory effort.  Few wheezes, no rales, some rhonchi.  Heart: Irregular with normal S1 S2. no murmur gallop, no rub, PMI is normal size and placement, carotid upstroke normal without bruit, jugular venous pressure is normal Abdomen: Soft, non-tender, non-distended with normoactive bowel sounds. No hepatomegaly. No rebound/guarding. No obvious abdominal masses. Abdominal aorta is normal size without bruit Extremities: Trace edema. no cyanosis, no clubbing, no ulcers  Peripheral : 2+ bilateral upper extremity pulses, 2+ bilateral femoral pulses, 2+ bilateral dorsal pedal pulse Neuro: Alert and oriented. No facial asymmetry. No focal deficit. Moves all extremities spontaneously. Musculoskeletal: Normal muscle tone without kyphosis Psych:  Responds to questions appropriately with a normal affect.    Assessment: 77 year old female with acute respiratory failure and side effects of antibiotics  with chronic nonvalvular atrial fibrillation with a  slightly increased RR interval up to 2 seconds but no evidence of significant symptoms at this time.evidence of heart failure and/or myocardial infarction  Plan: 1.  Continue current medication management for heart rate control of propranolol which is also helping tremor and is not currently causing significant amount of symptomatic bradycardia or pauses 2.  Continue telemetry and watch for significance of pauses or other concerns 3.  Continue anticoagulation for further risk reduction in stroke with atrial fibrillation 4.  No further cardiac diagnostics necessary at this time 5.  Continue medication management for supportive care of respiratory failure without restriction 6.  Further treatment options after above  Signed, Corey Skains M.D. [facility] 11/07/2017, 1:18 PM

## 2017-11-07 NOTE — Care Management Important Message (Signed)
Important Message  Patient Details  Name: Glenda Williams MRN: [id number] Date of Birth: 1940/06/27   Medicare Important Message Given:  Yes    [name] 11/07/2017, 10:59 AM

## 2017-11-08 MED ORDER — LEVOTHYROXINE SODIUM 100 MCG PO TABS: 100.0000 ug | ORAL_TABLET | Freq: Every day | ORAL | 0 refills | Status: DC

## 2017-11-08 MED ORDER — PROPRANOLOL HCL 40 MG PO TABS: 40.0000 mg | ORAL_TABLET | Freq: Every morning | ORAL | Status: DC

## 2017-11-08 MED ORDER — PROPRANOLOL HCL 40 MG PO TABS: ORAL_TABLET | ORAL | 0 refills | Status: DC

## 2017-11-08 MED ORDER — PROPRANOLOL HCL 20 MG PO TABS
20.0000 mg | ORAL_TABLET | Freq: Every day | ORAL | Status: DC
  Filled 2017-11-08: qty 1

## 2017-11-08 MED ORDER — PREDNISONE 10 MG (21) PO TBPK: ORAL_TABLET | ORAL | 0 refills | Status: DC

## 2017-11-08 NOTE — Progress Notes (Addendum)
Physical Therapy Treatment Patient Details Name: Glenda Williams MRN: [id number] DOB: 02-14-1941 Today's Date: 11/08/2017    History of Present Illness presented to ER secondary to worsening rash, SOB; admitted with acute hypoxic respiratory failure and acute allergic reactions to Bactrim.  Initially requiring BiPAP; now weaned to 2L.    PT Comments    Patient with progressive increase in gait distance and overall activity tolerance.  Optimal safety/indep and energy conservation with use of RW (vs SPC); recommend continued use of RW for longer, community distances.  Patient voices agreement/understanding. Good insight and safety awareness, performing in-room mobility tasks at mod indep level.  Discussed with primary RN; agreeable to allow patient up ad lib privileges in room. HR stable, 50-60s, asymptomatic throughout session.  Maintains sats >92% on RA at rest and with exertion throughout session.    Follow Up Recommendations  No PT follow up     Equipment Recommendations       Recommendations for Other Services       Precautions / Restrictions Precautions Precautions: Fall Restrictions Weight Bearing Restrictions: No    Mobility  Bed Mobility               General bed mobility comments: seated in recliner beginning/end of treatment sessions  Transfers Overall transfer level: Needs assistance Equipment used: Rolling walker (2 wheeled) Transfers: Sit to/from Stand Sit to Stand: Modified independent (Device/Increase time)         General transfer comment: fair/good LE strength/power with movement transitions; minimal use of UE support to complete movement transition  Ambulation/Gait Ambulation/Gait assistance: Supervision Gait Distance (Feet): 180 Feet Assistive device: Straight cane       General Gait Details: slow, but steady, cadence; baseline weakness to L posterior/lateral hip.  Mild SOB with exertion, sats >92% on RA throughout.   Stairs              Wheelchair Mobility    Modified Rankin (Stroke Patients Only)       Balance Overall balance assessment: Needs assistance Sitting-balance support: No upper extremity supported;Feet supported Sitting balance-Leahy Scale: Good     Standing balance support: Single extremity supported Standing balance-Leahy Scale: Fair                              Cognition Arousal/Alertness: Awake/alert Behavior During Therapy: WFL for tasks assessed/performed Overall Cognitive Status: Within Functional Limits for tasks assessed                                        Exercises Other Exercises Other Exercises: 180' with RW, sup/mod indep-improved gait symmetry; decreased antalgic deviations with more supportive assist device.  Decreased energy expenditure and overall SOB. Other Exercises: Toilet transfer, ambulatory with RW, mod indep; sit/stand, standing balance, mod indep.  Good awareness of limits of stability, good insight and use of compensatory strategies as needed.    General Comments        Pertinent Vitals/Pain Pain Assessment: No/denies pain    Home Living                      Prior Function            PT Goals (current goals can now be found in the care plan section) Acute Rehab PT Goals Patient Stated Goal: to move around a little,  then get a good nap PT Goal Formulation: With patient Time For Goal Achievement: 11/20/17 Potential to Achieve Goals: Good Progress towards PT goals: Progressing toward goals    Frequency    Min 2X/week      PT Plan Current plan remains appropriate    Co-evaluation              AM-PAC PT "6 Clicks" Daily Activity  Outcome Measure  Difficulty turning over in bed (including adjusting bedclothes, sheets and blankets)?: None Difficulty moving from lying on back to sitting on the side of the bed? : None Difficulty sitting down on and standing up from a chair with arms (e.g., wheelchair,  bedside commode, etc,.)?: A Little Help needed moving to and from a bed to chair (including a wheelchair)?: A Little Help needed walking in hospital room?: A Little Help needed climbing 3-5 steps with a railing? : A Little 6 Click Score: 20    End of Session Equipment Utilized During Treatment: Gait belt Activity Tolerance: Patient tolerated treatment well Patient left: in chair;with call bell/phone within reach;with family/visitor present Nurse Communication: Mobility status PT Visit Diagnosis: Muscle weakness (generalized) (M62.81);Difficulty in walking, not elsewhere classified (R26.2)     Time: 1962-2297 PT Time Calculation (min) (ACUTE ONLY): 26 min  Charges:  $Gait Training: 8-22 mins $Therapeutic Activity: 8-22 mins                     Phillips Goulette H. Owens Shark, PT, DPT, NCS 11/08/17, 12:13 PM [phone]

## 2017-11-08 NOTE — Progress Notes (Signed)
[facility] Encounter Note  Patient: Glenda Williams / Admit Date: 11/04/2017 / Date of Encounter: 11/08/2017, 1:56 PM   Subjective: Significant improvements from admission from the respiratory standpoint.  Patient does have chronic nonvalvular atrial fibrillation with somewhat slower heart rate although no significant symptoms of dizziness weakness fatigue or syncope.  Heart rates are somewhat slower at this time in the 30 to 40 bpm range although the patient does say this occurs at home without symptoms.  She may benefit by decrease in the dose of the propranolol  Review of Systems: Positive for: Shortness of breath Negative for: Vision change, hearing change, syncope, dizziness, nausea, vomiting,diarrhea, bloody stool, stomach pain, cough, congestion, diaphoresis, urinary frequency, urinary pain,skin lesions, skin rashes Others previously listed  Objective: Telemetry: Atrial fibrillation Physical Exam: Blood pressure 119/70, pulse (!) 59, temperature 98.2 F (36.8 C), temperature source Oral, resp. rate 15, height 5\' 6"  (1.676 m), weight 107 kg, SpO2 94 %. Body mass index is 38.09 kg/m. General: Well developed, well nourished, in no acute distress. Head: Normocephalic, atraumatic, sclera non-icteric, no xanthomas, nares are without discharge. Neck: No apparent masses Lungs: Normal respirations with no wheezes, no rhonchi, no rales , no crackles   Heart: Regular rate and rhythm, normal S1 S2, no murmur, no rub, no gallop, PMI is normal size and placement, carotid upstroke normal without bruit, jugular venous pressure normal Abdomen: Soft, non-tender, non-distended with normoactive bowel sounds. No hepatosplenomegaly. Abdominal aorta is normal size without bruit Extremities: No edema, no clubbing, no cyanosis, no ulcers,  Peripheral: 2+ radial, 2+ femoral, 2+ dorsal pedal pulses Neuro: Alert and oriented. Moves all extremities spontaneously. Psych:  Responds to  questions appropriately with a normal affect.   Intake/Output Summary (Last 24 hours) at 11/08/2017 1356 Last data filed at 11/08/2017 1339 Gross per 24 hour  Intake 480 ml  Output -  Net 480 ml    Inpatient Medications:  . budesonide (PULMICORT) nebulizer solution  0.5 mg Nebulization BID  . cephALEXin  500 mg Oral Q12H  . chlorhexidine  15 mL Mouth Rinse BID  . famotidine  20 mg Oral Q12H  . folic acid  1 mg Oral Daily  . guaiFENesin  600 mg Oral BID  . hydrochlorothiazide  12.5 mg Oral Daily  . ipratropium-albuterol  3 mL Nebulization BID  . levothyroxine  100 mcg Oral Daily  . mouth rinse  15 mL Mouth Rinse q12n4p  . methylPREDNISolone (SOLU-MEDROL) injection  60 mg Intravenous Daily  . multivitamin with minerals  1 tablet Oral Daily  . potassium chloride SA  20 mEq Oral Daily  . propranolol  40 mg Oral QHS  . propranolol  80 mg Oral q morning - 10a  . rivaroxaban  20 mg Oral Daily  . sertraline  150 mg Oral Daily  . sodium chloride flush  3 mL Intravenous Q12H   Infusions:  . sodium chloride      Labs: Recent Labs    11/06/17 0420 11/07/17 0326  NA 140 142  K 2.9* 4.4  CL 99 98  CO2 35* 35*  GLUCOSE 121* 100*  BUN 22 21  CREATININE 0.76 0.73  CALCIUM 8.8* 8.9  MG 1.8  --    No results for input(s): AST, ALT, ALKPHOS, BILITOT, PROT, ALBUMIN in the last 72 hours. Recent Labs    11/07/17 1047  WBC 25.5*  HGB 15.6  HCT 46.4  MCV 91.3  PLT 302   No results for input(s): CKTOTAL, CKMB,  TROPONINI in the last 72 hours. Invalid input(s): POCBNP No results for input(s): HGBA1C in the last 72 hours.   Weights: Filed Weights   11/06/17 0500 11/06/17 0915 11/07/17 0421  Weight: 105.9 kg 106.7 kg 107 kg     Radiology/Studies:  Ct Angio Chest Pe W And/or Wo Contrast  Result Date: 11/04/2017 CLINICAL DATA:  Short of breath. Hypoxia. Recent drug reaction to Bactrim. Patient receiving immunotherapy for rheumatoid arthritis EXAM: CT ANGIOGRAPHY CHEST WITH  CONTRAST TECHNIQUE: Multidetector CT imaging of the chest was performed using the standard protocol during bolus administration of intravenous contrast. Multiplanar CT image reconstructions and MIPs were obtained to evaluate the vascular anatomy. CONTRAST:  8mL OMNIPAQUE IOHEXOL 350 MG/ML SOLN COMPARISON:  11/04/2017 FINDINGS: Cardiovascular: No filling defects within the pulmonary arteries to suggest acute pulmonary embolism. No acute findings of the aorta or great vessels. No pericardial fluid. Coronary artery calcification and aortic atherosclerotic calcification. Mediastinum/Nodes: No axillary supraclavicular adenopathy. No mediastinal hilar adenopathy. Borderline enlarged RIGHT lower paratracheal lymph node is measures 10 mm short axis subcarinal node measures 11 mm short axis. Lungs/Pleura: Bilateral dense ground-glass opacities which are flame shaped and noted throughout the LEFT and RIGHT lung in a somewhat perihilar distribution (coronal image 48/7). No discrete measurable nodularity. There is mild the thickening of the associated bronchials. No bronchiectasis. No pleural fluid. Example opacity in the medial RIGHT middle lobe measuring 3.5 by 2.3 cm. Example opacity in the lingula measuring 2.8 by 1.9 cm Upper Abdomen: Limited view of the liver, kidneys, pancreas are unremarkable. Normal adrenal glands. Musculoskeletal: No aggressive osseous lesion. Degenerative osteophytosis of the spine. Review of the MIP images confirms the above findings. IMPRESSION: 1. Bilateral flame shaped dense ground-glass opacities do not have a pattern of typical of pulmonary infection. Mild bronchial thickening associated with the predominately centralized pulmonary opacities. Finding are more suggestive of inflammatory process or drug reaction. Recommend follow-up high-resolution CT if symptoms do not improve following acute exacerbation. 2. Mild mediastinal adenopathy is likely reactive. 3. No evidence of acute pulmonary  embolism. Electronically Signed   By: Suzy Bouchard M.D.   On: 11/04/2017 13:43   Dg Chest Port 1 View  Result Date: 11/06/2017 CLINICAL DATA:  Pneumonia. EXAM: PORTABLE CHEST 1 VIEW COMPARISON:  11/04/2017. FINDINGS: Mediastinum and hilar structures normal. Stable cardiomegaly with normal pulmonary vascularity. Near complete clearing of bilateral pulmonary infiltrates with mild residual. No pleural effusion or pneumothorax. IMPRESSION: 1. Near complete clearing of bilateral pulmonary infiltrates with mild residual. 2.  Stable cardiomegaly. Electronically Signed   By: Marcello Moores  Register   On: 11/06/2017 07:04   Dg Chest Port 1 View  Result Date: 11/04/2017 CLINICAL DATA:  Dyspnea and shortness of breat. EXAM: PORTABLE CHEST 1 VIEW COMPARISON:  PA and lateral chest 05/10/2017 and 06/13/2016. FINDINGS: There is cardiomegaly and mild interstitial edema. More focal airspace disease is seen in the right lung base. No pneumothorax or pleural effusion. Aortic atherosclerosis noted. No focal bony abnormality. IMPRESSION: Cardiomegaly and mild interstitial edema. More focal airspace disease in the right lung base could be due to atelectasis or pneumonia. Electronically Signed   By: Inge Rise M.D.   On: 11/04/2017 12:14     Assessment and Recommendation  77 y.o. female 77 year old female with essential hypertension mixed hyperlipidemia and acute respiratory failure with chronic nonvalvular atrial fibrillation with slow ventricular rate due to propranolol without current symptoms 1.  Decrease propranolol dose to 40 mg twice per day with continued ambulation.  If no further  significant symptoms okay for discharge home with further assessment and treatment and adjustments of medications next week with Dr. Ubaldo Glassing 2.  Continue anticoagulation for further risk reduction in stroke with atrial fibrillation 3.  No further cardiac diagnostics necessary at this time  Signed, Serafina Royals M.D. FACC

## 2017-11-08 NOTE — Progress Notes (Signed)
Patient refusing bed/chair alarm. Good mobility per PT and RN observation. Educated on safety. Cane and walker within reach.

## 2017-11-08 NOTE — Discharge Summary (Signed)
[facility] NAME: Glenda Williams    MR#:  [id number]  DATE OF BIRTH:  [birth year]  DATE OF ADMISSION:  11/04/2017 ADMITTING PHYSICIAN: Gorden Harms, MD  DATE OF DISCHARGE: 11/08/2017   PRIMARY CARE PHYSICIAN: Trinna Post, PA-C    ADMISSION DIAGNOSIS:  Rash [R21] Dyspnea [R06.00] Abnormal EKG [R94.31] Hypoxia [R09.02] Elevated troponin [R74.8] Respiratory failure ([place]) [J96.90]  DISCHARGE DIAGNOSIS:  Active Problems:   Respiratory failure ([place])   SECONDARY DIAGNOSIS:   Past Medical History:  Diagnosis Date  . A-fib ([place])   . Arthritis   . GERD (gastroesophageal reflux disease)   . Heart disease   . Rheumatoid arthritis ([place])   . Thyroid disease   . Tremors of nervous system     HOSPITAL COURSE:   77 year old female with past medical history significant for A. fib on Xarelto, CAD, rheumatoid arthritis, hypothyroidism and GERD presents from hospital secondary to difficulty breathing and also significant rash all over her body.  1.  Acute hypoxic respiratory failure- -likely acute pneumonitis secondary to allergic reaction to Bactrim. -Appreciate pulmonary consult.  CT with significant opacities and mediastinal adenopathy noted. -Continue steroids, antihistamines at this time -Empiric antibiotics with cefepime due to leukocytosis.  - On admission on BiPAP, not on home oxygen.  Wean to nasal cannula as tolerated. - Respiratory viral panel is Negative. -  taper to room air now on ambulation. - Much improved, Xray showed cleared edema.  2.  Maculopapular rash-likely secondary to sulfa allergy.  Diffuse maculopapular rash noted on upper part of the legs, trunk, palms and back. - Continue treatment with steroids, antihistamines for now. - taper steroids on discharge.  3.  Leukocytosis-no other source of infection noted.  Being treated for infection/pneumonitis with cefepime and also on steroids. -  Monitor closely.  Denies any diarrhea. - Procalcitonin is negative - Change to Oral keflex for 3 days for UTI.  4.  Atrial fibrillation-rate controlled On Xarelto for anticoagulation.  5.  Hypothyroidism-continue Synthroid   Increased dose of Levothyroxine to 100 mcg due to high TSH.  6.  DVT prophylaxis-patient already on Xarelto  7. Sinus bradycardia   Called cardio consult- advise to cont same meds as asymptomatic, monitor. Baseline ambulates with a cane.  Allow to ambulate in room. Due to persistent intermittent Bradycardia- cardiologist suggest to decrease propranolol now and discharge with follow up next week, pt is asymptomatic.   DISCHARGE CONDITIONS:  Stable.  CONSULTS OBTAINED:  Treatment Team:  Corey Skains, MD  DRUG ALLERGIES:   Allergies  Allergen Reactions  . Bactrim [Sulfamethoxazole-Trimethoprim] Rash  . Macrobid [Nitrofurantoin Macrocrystal] Rash  . Mirabegron Rash  . Oxybutynin Rash  . Penicillins Rash    Has patient had a PCN reaction causing immediate rash, facial/tongue/throat swelling, SOB or lightheadedness with hypotension: Yes Has patient had a PCN reaction causing severe rash involving mucus membranes or skin necrosis: No Has patient had a PCN reaction that required hospitalization: No Has patient had a PCN reaction occurring within the last 10 years: Unknown If all of the above answers are "NO", then may proceed with Cephalosporin use.     DISCHARGE MEDICATIONS:   Allergies as of 11/08/2017      Reactions   Bactrim [sulfamethoxazole-trimethoprim] Rash   Macrobid [nitrofurantoin Macrocrystal] Rash   Mirabegron Rash   Oxybutynin Rash   Penicillins Rash   Has patient had a PCN reaction causing immediate rash, facial/tongue/throat swelling, SOB or lightheadedness  with hypotension: Yes Has patient had a PCN reaction causing severe rash involving mucus membranes or skin necrosis: No Has patient had a PCN reaction that required  hospitalization: No Has patient had a PCN reaction occurring within the last 10 years: Unknown If all of the above answers are "NO", then may proceed with Cephalosporin use.      Medication List    TAKE these medications   albuterol 108 (90 Base) MCG/ACT inhaler Commonly known as:  PROVENTIL HFA;VENTOLIN HFA Inhale 2 puffs into the lungs every 6 (six) hours as needed for wheezing or shortness of breath.   cholecalciferol 400 units Tabs tablet Commonly known as:  VITAMIN D Take 400 Units by mouth daily.   EPINEPHrine 0.3 mg/0.3 mL Soaj injection Commonly known as:  EPI-PEN Inject 0.3 mLs into the skin once as needed for anaphylaxis.   Fish Oil 1000 MG Caps Take 1 capsule by mouth daily.   folic acid 1 MG tablet Commonly known as:  FOLVITE Take 1 mg by mouth daily.   hydrochlorothiazide 12.5 MG tablet Commonly known as:  HYDRODIURIL TAKE 1 TABLET BY MOUTH  DAILY   levothyroxine 100 MCG tablet Commonly known as:  SYNTHROID, LEVOTHROID Take 1 tablet (100 mcg total) by mouth daily. Start taking on:  11/09/2017 What changed:    medication strength  how much to take   methotrexate 2.5 MG tablet Commonly known as:  RHEUMATREX Take 20 mg by mouth every Thursday.   omeprazole 20 MG capsule Commonly known as:  PRILOSEC TAKE 1 CAPSULE BY MOUTH 2  TIMES DAILY BEFORE A MEAL. What changed:  See the new instructions.   potassium chloride SA 20 MEQ tablet Commonly known as:  K-DUR,KLOR-CON Take 1 tablet by mouth  daily   predniSONE 10 MG (21) Tbpk tablet Commonly known as:  STERAPRED UNI-PAK 21 TAB Take 6 tabs first day, 5 tab on day 2, then 4 on day 3rd, 3 tabs on day 4th , 2 tab on day 5th, and 1 tab on 6th day.   propranolol 40 MG tablet Commonly known as:  INDERAL 1 tablets in the am, 0.5 tablet in pm What changed:    how much to take  how to take this  when to take this  additional instructions   Red Yeast Rice 600 MG Caps Take 1 capsule by mouth daily.    REMICADE IV Inject into the vein. Every 6-8 weeks   sertraline 50 MG tablet Commonly known as:  ZOLOFT TAKE 3 TABLETS BY MOUTH  DAILY   XARELTO 20 MG Tabs tablet Generic drug:  rivaroxaban TAKE 1 TABLET BY MOUTH ONCE DAILY        DISCHARGE INSTRUCTIONS:    Follow with PMD and cardiology in 1-2 weeks.  If you experience worsening of your admission symptoms, develop shortness of breath, life threatening emergency, suicidal or homicidal thoughts you must seek medical attention immediately by calling 911 or calling your MD immediately  if symptoms less severe.  You Must read complete instructions/literature along with all the possible adverse reactions/side effects for all the Medicines you take and that have been prescribed to you. Take any new Medicines after you have completely understood and accept all the possible adverse reactions/side effects.   Please note  You were cared for by a hospitalist during your hospital stay. If you have any questions about your discharge medications or the care you received while you were in the hospital after you are discharged, you can call the  unit and asked to speak with the hospitalist on call if the hospitalist that took care of you is not available. Once you are discharged, your primary care physician will handle any further medical issues. Please note that NO REFILLS for any discharge medications will be authorized once you are discharged, as it is imperative that you return to your primary care physician (or establish a relationship with a primary care physician if you do not have one) for your aftercare needs so that they can reassess your need for medications and monitor your lab values.    Today   CHIEF COMPLAINT:   Chief Complaint  Patient presents with  . Shortness of Breath    HISTORY OF PRESENT ILLNESS:  Glenda Williams  is a 78 y.o. female was seen on Thursday for allergic reaction/rash due to Bactrim that was prescribed for UTI,  returns today with shortness of breath, per patient/son/husband patient has been short of breath since about Friday-2 days ago, associated with cough, denies fevers/chills/night sweats/orthopnea/leg swelling, they denied any history of underlying lung disease or heart failure, in the emergency room patient was found to have tachypnea, acute hypoxia requiring oxygen via nasal cannula, white count of 30,000, venous blood gas noted for acute respiratory acidosis with pH 7.25/PCO2 73, chest x-ray inconclusive, CT chest to evaluate for possible pulmonary embolism-pending, evaluation in the emergency room noted for patient consistently dropping O2 saturation into the 70s while on oxygen via nasal cannula patient is now being admitted for acute hypoxic respiratory failure secondary to unknown etiology and acute allergic reaction to Bactrim inpatient that is immunocompromised due to rheumatoid arthritis/Remicade use.   VITAL SIGNS:  Blood pressure 119/70, pulse (!) 59, temperature 98.2 F (36.8 C), temperature source Oral, resp. rate 15, height 5\' 6"  (1.676 m), weight 107 kg, SpO2 94 %.  I/O:    Intake/Output Summary (Last 24 hours) at 11/08/2017 1450 Last data filed at 11/08/2017 1339 Gross per 24 hour  Intake 480 ml  Output -  Net 480 ml    PHYSICAL EXAMINATION:   GENERAL:  77 y.o.-year-old patient lying in the bed with no acute distress.  EYES: Pupils equal, round, reactive to light and accommodation. No scleral icterus. Extraocular muscles intact.  HEENT: Head atraumatic, normocephalic. Oropharynx and nasopharynx clear.  NECK:  Supple, no jugular venous distention. No thyroid enlargement, no tenderness.  LUNGS: Normal breath sounds bilaterally, no wheezing, rales,rhonchi or crepitation. No use of accessory muscles of respiration.  CARDIOVASCULAR: S1, S2 normal. No  rubs, or gallops. 3/6 systolic murmur is present ABDOMEN: Soft, nontender, nondistended. Bowel sounds present. No organomegaly or mass.   EXTREMITIES: No pedal edema, cyanosis, or clubbing.  NEUROLOGIC: Cranial nerves II through XII are intact. Muscle strength 5/5 in all extremities. Sensation intact. Gait not checked.  PSYCHIATRIC: The patient is alert and oriented x 3.  SKIN: significant maculopapular rash all over her trunk, back, and upper part of legs noted.   DATA REVIEW:   CBC Recent Labs  Lab 11/07/17 1047  WBC 25.5*  HGB 15.6  HCT 46.4  PLT 302    Chemistries  Recent Labs  Lab 11/04/17 1136  11/06/17 0420 11/07/17 0326  NA 136   < > 140 142  K 4.2   < > 2.9* 4.4  CL 97*   < > 99 98  CO2 30   < > 35* 35*  GLUCOSE 184*   < > 121* 100*  BUN 15   < > 22 21  CREATININE 0.78   < > 0.76 0.73  CALCIUM 8.8*   < > 8.8* 8.9  MG  --    < > 1.8  --   AST 30  --   --   --   ALT 24  --   --   --   ALKPHOS 65  --   --   --   BILITOT 1.3*  --   --   --    < > = values in this interval not displayed.    Cardiac Enzymes Recent Labs  Lab 11/05/17 0143  TROPONINI 0.03*    Microbiology Results  Results for orders placed or performed during the hospital encounter of 11/04/17  MRSA PCR Screening     Status: None   Collection Time: 11/04/17  1:50 PM  Result Value Ref Range Status   MRSA by PCR NEGATIVE NEGATIVE Final    Comment:        The GeneXpert MRSA Assay (FDA approved for NASAL specimens only), is one component of a comprehensive MRSA colonization surveillance program. It is not intended to diagnose MRSA infection nor to guide or monitor treatment for MRSA infections. Performed at [facility], [address] [place], [place] [address]   Culture, blood (Routine X 2) w Reflex to ID Panel     Status: None (Preliminary result)   Collection Time: 11/04/17  2:23 PM  Result Value Ref Range Status   Specimen Description BLOOD RAC  Final   Special Requests   Final    BOTTLES DRAWN AEROBIC AND ANAEROBIC Blood Culture adequate volume   Culture   Final    NO GROWTH 4 DAYS Performed at  [facility], [address] [place], [place] [address]    Report Status PENDING  Incomplete  Culture, blood (Routine X 2) w Reflex to ID Panel     Status: None (Preliminary result)   Collection Time: 11/04/17  2:33 PM  Result Value Ref Range Status   Specimen Description BLOOD LAC  Final   Special Requests   Final    BOTTLES DRAWN AEROBIC AND ANAEROBIC Blood Culture adequate volume   Culture   Final    NO GROWTH 4 DAYS Performed at [facility], [address] [place], [place] [address]    Report Status PENDING  Incomplete  Respiratory Panel by PCR     Status: None   Collection Time: 11/04/17  5:51 PM  Result Value Ref Range Status   Adenovirus NOT DETECTED NOT DETECTED Final   Coronavirus 229E NOT DETECTED NOT DETECTED Final   Coronavirus HKU1 NOT DETECTED NOT DETECTED Final   Coronavirus NL63 NOT DETECTED NOT DETECTED Final   Coronavirus OC43 NOT DETECTED NOT DETECTED Final   Metapneumovirus NOT DETECTED NOT DETECTED Final   Rhinovirus / Enterovirus NOT DETECTED NOT DETECTED Final   Influenza A NOT DETECTED NOT DETECTED Final   Influenza B NOT DETECTED NOT DETECTED Final   Parainfluenza Virus 1 NOT DETECTED NOT DETECTED Final   Parainfluenza Virus 2 NOT DETECTED NOT DETECTED Final   Parainfluenza Virus 3 NOT DETECTED NOT DETECTED Final   Parainfluenza Virus 4 NOT DETECTED NOT DETECTED Final   Respiratory Syncytial Virus NOT DETECTED NOT DETECTED Final   Bordetella pertussis NOT DETECTED NOT DETECTED Final   Chlamydophila pneumoniae NOT DETECTED NOT DETECTED Final   Mycoplasma pneumoniae NOT DETECTED NOT DETECTED Final    Comment: Performed at [facility], [place] [address] [place], [place] [address]  RADIOLOGY:  No results found.  EKG:   Orders placed or performed during the hospital encounter of 11/04/17  . EKG 12-Lead  . EKG 12-Lead  . ED EKG  . ED EKG      Management plans discussed with the patient, family and they are in  agreement.  CODE STATUS:     Code Status Orders  (From admission, onward)         Start     Ordered   11/04/17 1349  Full code  Continuous     11/04/17 1348        Code Status History    This patient has a current code status but no historical code status.    Advance Directive Documentation     Most Recent Value  Type of Advance Directive  Healthcare Power of Attorney, Living will  Pre-existing out of facility DNR order (yellow form or pink MOST form)  -  "MOST" Form in Place?  -      TOTAL TIME TAKING CARE OF THIS PATIENT: 35 minutes.    Vaughan Basta M.D on 11/08/2017 at 2:50 PM  Between 7am to 6pm - Pager - [phone]  After 6pm go to [email or web address] - password [email or web address] [facility]  Office  [phone]  CC: Primary care physician; Trinna Post, PA-C   Note: This dictation was prepared with Dragon dictation along with smaller phrase technology. Any transcriptional errors that result from this process are unintentional.

## 2017-11-08 NOTE — Progress Notes (Addendum)
Per CCMD, patient has had two episodes of heart rate dropping to 30's for a few seconds but quickly returning to 60's. Patient has been asymptomatic. Paged Dr. Anselm Jungling. MD to discuss with cardiology.

## 2017-11-08 NOTE — Progress Notes (Signed)
[name] to be D/C'd Home per MD order. Patient given discharge teaching and paperwork regarding medications, diet, follow-up appointments and activity. Patient understanding verbalized. No questions or complaints at this time. Skin condition as charted. IV and telemetry removed prior to leaving.  No further needs by Care Management/Social Work. Prescriptions given to patient.  An After Visit Summary was printed and given to the patient.   Patient escorted via wheelchair.  Terrilyn Saver

## 2017-11-09 LAB — CULTURE, BLOOD (ROUTINE X 2)
CULTURE: NO GROWTH
CULTURE: NO GROWTH
SPECIAL REQUESTS: ADEQUATE
Special Requests: ADEQUATE

## 2017-11-09 LAB — MYCOPLASMA PNEUMONIAE ANTIBODY, IGM: Mycoplasma pneumo IgM: <770 U/mL (ref 0–769)

## 2017-11-13 ENCOUNTER — Ambulatory Visit (INDEPENDENT_AMBULATORY_CARE_PROVIDER_SITE_OTHER): Payer: Medicare Other | Admitting: Family Medicine

## 2017-11-13 ENCOUNTER — Encounter: Payer: Self-pay | Admitting: Family Medicine

## 2017-11-13 VITALS — BP 124/76 | HR 78 | Temp 97.8°F | Resp 18

## 2017-11-13 DIAGNOSIS — I4891 Unspecified atrial fibrillation: Secondary | ICD-10-CM | POA: Diagnosis not present

## 2017-11-13 DIAGNOSIS — G250 Essential tremor: Secondary | ICD-10-CM

## 2017-11-13 DIAGNOSIS — R319 Hematuria, unspecified: Secondary | ICD-10-CM | POA: Diagnosis not present

## 2017-11-13 DIAGNOSIS — J679 Hypersensitivity pneumonitis due to unspecified organic dust: Secondary | ICD-10-CM | POA: Diagnosis not present

## 2017-11-13 DIAGNOSIS — N390 Urinary tract infection, site not specified: Secondary | ICD-10-CM | POA: Diagnosis not present

## 2017-11-13 DIAGNOSIS — E039 Hypothyroidism, unspecified: Secondary | ICD-10-CM

## 2017-11-13 LAB — POCT URINALYSIS DIPSTICK
GLUCOSE UA: NEGATIVE
Ketones, UA: NEGATIVE
Nitrite, UA: NEGATIVE
Protein, UA: POSITIVE — AB
Spec Grav, UA: 1.02 (ref 1.010–1.025)
Urobilinogen, UA: 0.2 E.U./dL
pH, UA: 6 (ref 5.0–8.0)

## 2017-11-13 NOTE — Progress Notes (Signed)
Patient: Glenda Williams Female    DOB: 1941-02-01   77 y.o.   MRN: [id number] Visit Date: 11/13/2017  Today's Provider: Lelon Huh, MD   Chief Complaint  Patient presents with  . Follow-up   Subjective:    HPI  Follow up Hospitalization Was initially seen in ER on 11/02/2017 for rash and bronchospasm thought secondary to trimetoprim/sulfa prescribed two days earlier for UTI. She was given albuterol, Pepcid, Solu-medrol and benadryl.in the ER and prescribed Epipen, prednisone and albuterol. Antibiotic was changed to cephalexin.    She returned to ER On 11/04/2017 for Hypoxia-respiratory failure. Likely pneumonitis secondary to allergic reaction tio Bactrim.  Admitted to ICU, given IV solumedrol and IV levofloxacin and cefepime for possible pulmonary infection.  She was discharged with oral cephalexin, but has not taken any since going home. It is unclear if she took any dose of cephalexin in the hospital.   Also of note is that her levothyroxine was increased to 176mcg daily Lab Results  Component Value Date   TSH 6.104 (H) 11/04/2017   And her propranolol was reduced by half to 1 tablet in the morning and 1/2 tablet in the evening due to increased R-R intervals. She states her tremors have worsened since reducing propranolol. She has followed scheduled with Dr. Ubaldo Glassing on 11/15/2017 ------------------------------------------------------------------------------------    Allergies  Allergen Reactions  . Bactrim [Sulfamethoxazole-Trimethoprim] Rash  . Macrobid [Nitrofurantoin Macrocrystal] Rash  . Mirabegron Rash  . Oxybutynin Rash  . Penicillins Rash    Has patient had a PCN reaction causing immediate rash, facial/tongue/throat swelling, SOB or lightheadedness with hypotension: Yes Has patient had a PCN reaction causing severe rash involving mucus membranes or skin necrosis: No Has patient had a PCN reaction that required hospitalization: No Has patient had a PCN reaction  occurring within the last 10 years: Unknown If all of the above answers are "NO", then may proceed with Cephalosporin use.      Current Outpatient Medications:  .  albuterol (PROVENTIL HFA;VENTOLIN HFA) 108 (90 Base) MCG/ACT inhaler, Inhale 2 puffs into the lungs every 6 (six) hours as needed for wheezing or shortness of breath., Disp: 1 Inhaler, Rfl: 0 .  cholecalciferol (VITAMIN D) 400 units TABS tablet, Take 400 Units by mouth daily., Disp: , Rfl:  .  EPINEPHrine 0.3 mg/0.3 mL IJ SOAJ injection, Inject 0.3 mLs into the skin once as needed for anaphylaxis., Disp: , Rfl: 1 .  folic acid (FOLVITE) 1 MG tablet, Take 1 mg by mouth daily., Disp: , Rfl:  .  hydrochlorothiazide (HYDRODIURIL) 12.5 MG tablet, TAKE 1 TABLET BY MOUTH  DAILY, Disp: 90 tablet, Rfl: 1 .  hydrocortisone cream 1 %, Apply 1 application topically as needed for itching., Disp: , Rfl:  .  InFLIXimab (REMICADE IV), Inject into the vein. Every 6-8 weeks, Disp: , Rfl:  .  levothyroxine (SYNTHROID, LEVOTHROID) 100 MCG tablet, Take 1 tablet (100 mcg total) by mouth daily., Disp: 30 tablet, Rfl: 0 .  methotrexate (RHEUMATREX) 2.5 MG tablet, Take 20 mg by mouth every Thursday. , Disp: , Rfl:  .  Omega-3 Fatty Acids (FISH OIL) 1000 MG CAPS, Take 1 capsule by mouth daily., Disp: , Rfl:  .  omeprazole (PRILOSEC) 20 MG capsule, TAKE 1 CAPSULE BY MOUTH 2  TIMES DAILY BEFORE A MEAL. (Patient taking differently: Take 20 mg by mouth 2 (two) times daily before a meal. TAKE 1 CAPSULE BY MOUTH 2  TIMES DAILY BEFORE A MEAL.), Disp:  180 capsule, Rfl: 1 .  potassium chloride SA (K-DUR,KLOR-CON) 20 MEQ tablet, Take 1 tablet by mouth  daily, Disp: 90 tablet, Rfl: 1 .  predniSONE (STERAPRED UNI-PAK 21 TAB) 10 MG (21) TBPK tablet, Take 6 tabs first day, 5 tab on day 2, then 4 on day 3rd, 3 tabs on day 4th , 2 tab on day 5th, and 1 tab on 6th day., Disp: 21 tablet, Rfl: 0 .  propranolol (INDERAL) 40 MG tablet, 1 tablets in the am, 0.5 tablet in pm, Disp:  60 tablet, Rfl: 0 .  Red Yeast Rice 600 MG CAPS, Take 1 capsule by mouth daily., Disp: , Rfl:  .  rivaroxaban (XARELTO) 20 MG TABS tablet, TAKE 1 TABLET BY MOUTH ONCE DAILY, Disp: , Rfl:  .  sertraline (ZOLOFT) 50 MG tablet, TAKE 3 TABLETS BY MOUTH  DAILY, Disp: 270 tablet, Rfl: 3  Review of Systems  Constitutional: Negative for appetite change, chills, fatigue and fever.  Respiratory: Positive for shortness of breath. Negative for chest tightness.   Cardiovascular: Negative for chest pain and palpitations.  Gastrointestinal: Negative for abdominal pain, nausea and vomiting.  Skin: Positive for rash.  Neurological: Negative for dizziness and weakness.    Social History   Tobacco Use  . Smoking status: Never Smoker  . Smokeless tobacco: Never Used  Substance Use Topics  . Alcohol use: No   Objective:   BP 124/76 (BP Location: Left Arm, Patient Position: Sitting, Cuff Size: Large)   Pulse 78   Temp 97.8 F (36.6 C) (Oral)   Resp 18   SpO2 96% Comment: room air Vitals:   11/13/17 1009  BP: 124/76  Pulse: 78  Resp: 18  Temp: 97.8 F (36.6 C)  TempSrc: Oral  SpO2: 96%     Physical Exam   General Appearance:    Alert, cooperative, no distress  Eyes:    PERRL, conjunctiva/corneas clear, EOM's intact       Lungs:     Clear to auscultation bilaterally, respirations unlabored  Heart:     Irregularly irregular rhythm. Normal rate   Neurologic:   Awake, alert, oriented x 3. No apparent focal neurological           defect.   Skin:   Scattered fading macular-papular eruptions across trunks and UEs.    Results for orders placed or performed in visit on 11/13/17  POCT Urinalysis Dipstick  Result Value Ref Range   Color, UA amber    Clarity, UA cloudy    Glucose, UA Negative Negative   Bilirubin, UA small    Ketones, UA negative    Spec Grav, UA 1.020 1.010 - 1.025   Blood, UA Large (Hemolyzed)    pH, UA 6.0 5.0 - 8.0   Protein, UA Positive (A) Negative   Urobilinogen,  UA 0.2 0.2 or 1.0 E.U./dL   Nitrite, UA negative    Leukocytes, UA Moderate (2+) (A) Negative   Appearance     Odor         Assessment & Plan:     1. Urinary tract infection with hematuria, site unspecified Minimally symptomatic. Will hold off on additional antibiotic until we get cultures. Has not take oral cephalexin given at discharge. Counseled patient that there is small risk of cross-reactivity between penicillin and cephalexin. If cultures positive it may be prudent to use a different class of antibiotic.  - POCT Urinalysis Dipstick - Urine Culture  2. Pneumonitis, hypersensitivity ([place]) Greatly improved. Finish prednisone as prescribed  at hospital discharge. Still has albuterol, but not had to take for several days.   3. Adult hypothyroidism Tolerating recent increased dose of levothyroxine.   4. Benign essential tremor Worse since propranolol was decreased in the hospital. She is going to talk to Dr. Ubaldo Glassing about this to see if she can go back up dose. If not may need to try another agent such as mysoline  5. Atrial fibrillation, unspecified type ([place]) Follow up cardiology as scheduled.        Lelon Huh, MD  [facility]

## 2017-11-15 DIAGNOSIS — I480 Paroxysmal atrial fibrillation: Secondary | ICD-10-CM | POA: Diagnosis not present

## 2017-11-15 DIAGNOSIS — I10 Essential (primary) hypertension: Secondary | ICD-10-CM | POA: Diagnosis not present

## 2017-11-15 DIAGNOSIS — E7800 Pure hypercholesterolemia, unspecified: Secondary | ICD-10-CM | POA: Diagnosis not present

## 2017-11-16 LAB — URINE CULTURE

## 2017-11-19 ENCOUNTER — Telehealth: Payer: Self-pay | Admitting: *Deleted

## 2017-11-19 NOTE — Telephone Encounter (Signed)
-----   Message from Birdie Sons, MD sent at 11/16/2017 11:46 AM EDT ----- Culture shows mild UTI. Need to start ciprofloxacin 500mg  twice a day x 7 days. I don't recommend taking the cephalexin prescribed from the hospital because there is a a small risk of cross reaction due to penicillin allergy.

## 2017-11-19 NOTE — Telephone Encounter (Signed)
No answer and no vm

## 2017-11-20 MED ORDER — CIPROFLOXACIN HCL 500 MG PO TABS: 500.0000 mg | ORAL_TABLET | Freq: Two times a day (BID) | ORAL | 0 refills | Status: DC

## 2017-11-20 NOTE — Telephone Encounter (Signed)
Pt returned call. Thanks TNP °

## 2017-11-20 NOTE — Telephone Encounter (Signed)
Patient advised as below. Patient verbalizes understanding and is in agreement with treatment plan.  

## 2017-11-20 NOTE — Telephone Encounter (Signed)
lmtcb

## 2017-11-26 ENCOUNTER — Other Ambulatory Visit: Payer: Self-pay | Admitting: Family Medicine

## 2017-11-26 MED ORDER — LEVOTHYROXINE SODIUM 100 MCG PO TABS: 100.0000 ug | ORAL_TABLET | Freq: Every day | ORAL | 2 refills | Status: DC

## 2017-11-26 NOTE — Telephone Encounter (Signed)
Pt contacted office for refill request on the following medications:  levothyroxine (SYNTHROID, LEVOTHROID) 100 MCG tablet   90 day supply  [facility]  Pt stated that she last got this mediation from when she was in the hospital and had her hospital F/U with Dr. Caryn Section since Fabio Bering is out of the office. Please advise. Thanks TNP

## 2017-12-10 DIAGNOSIS — M0579 Rheumatoid arthritis with rheumatoid factor of multiple sites without organ or systems involvement: Secondary | ICD-10-CM | POA: Diagnosis not present

## 2017-12-10 DIAGNOSIS — Z79899 Other long term (current) drug therapy: Secondary | ICD-10-CM | POA: Diagnosis not present

## 2017-12-18 ENCOUNTER — Telehealth: Payer: Self-pay | Admitting: Physician Assistant

## 2017-12-18 NOTE — Telephone Encounter (Signed)
Pt said she received a call from [place] today saying we were calling her back about her results/  She is not waiting on any results.  She does not know why we called her  Call Back is [phone]  Thanks teri

## 2017-12-27 DIAGNOSIS — Z23 Encounter for immunization: Secondary | ICD-10-CM | POA: Diagnosis not present

## 2018-01-14 ENCOUNTER — Other Ambulatory Visit: Payer: Self-pay | Admitting: Physician Assistant

## 2018-01-14 DIAGNOSIS — Z1231 Encounter for screening mammogram for malignant neoplasm of breast: Secondary | ICD-10-CM

## 2018-01-19 ENCOUNTER — Ambulatory Visit (INDEPENDENT_AMBULATORY_CARE_PROVIDER_SITE_OTHER): Payer: Medicare Other | Admitting: Family Medicine

## 2018-01-19 ENCOUNTER — Encounter: Payer: Self-pay | Admitting: Family Medicine

## 2018-01-19 VITALS — BP 122/70 | HR 64 | Temp 98.5°F | Resp 16

## 2018-01-19 DIAGNOSIS — T300 Burn of unspecified body region, unspecified degree: Secondary | ICD-10-CM | POA: Diagnosis not present

## 2018-01-19 MED ORDER — MUPIROCIN 2 % EX OINT: 1.0000 "application " | TOPICAL_OINTMENT | Freq: Two times a day (BID) | CUTANEOUS | 0 refills | Status: DC

## 2018-01-19 NOTE — Patient Instructions (Signed)
Burn Care, Adult A burn is an injury to the skin or the tissues under the skin. There are three types of burns:  First degree. These burns may cause the skin to be red and a bit swollen.  Second degree. These burns are very painful and cause the skin to be very red. The skin may also leak fluid, look shiny, and start to have blisters.  Third degree. These burns cause permanent damage. They turn the skin white or black and make it look charred, dry, and leathery.  Taking care of your burn properly can help to prevent pain and infection. It can also help the burn to heal more quickly. How is this treated? Right after a burn:  Rinse or soak the burn under cool water. Do this for several minutes. Do not put ice on your burn. That can cause more damage.  Lightly cover the burn with a clean (sterile) cloth (dressing). Burn care  Raise (elevate) the injured area above the level of your heart while sitting or lying down.  Follow instructions from your doctor about: ? How to clean and take care of the burn. ? When to change and remove the cloth.  Check your burn every day for signs of infection. Check for: ? More redness, swelling, or pain. ? Warmth. ? Pus or a bad smell. Medicine   Take over-the-counter and prescription medicines only as told by your doctor.  If you were prescribed antibiotic medicine, take or apply it as told by your doctor. Do not stop using the antibiotic even if your condition improves. General instructions  To prevent infection: ? Do not put butter, oil, or other home treatments on the burn. ? Do not scratch or pick at the burn. ? Do not break any blisters. ? Do not peel skin.  Do not rub your burn, even when you are cleaning it.  Protect your burn from the sun. Contact a doctor if:  Your condition does not get better.  Your condition gets worse.  You have a fever.  Your burn looks different or starts to have black or red spots on it.  Your burn  feels warm to the touch.  Your pain is not controlled with medicine. Get help right away if:  You have redness, swelling, or pain at the site of the burn.  You have fluid, blood, or pus coming from your burn.  You have red streaks near the burn.  You have very bad pain. This information is not intended to replace advice given to you by your health care provider. Make sure you discuss any questions you have with your health care provider. Document Released: 11/30/2007 Document Revised: 04/08/2016 Document Reviewed: 08/10/2015 Elsevier Interactive Patient Education  [employer].
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
## 2018-01-21 DIAGNOSIS — G8929 Other chronic pain: Secondary | ICD-10-CM | POA: Diagnosis not present

## 2018-01-21 DIAGNOSIS — M545 Low back pain: Secondary | ICD-10-CM | POA: Diagnosis not present

## 2018-01-21 DIAGNOSIS — M0579 Rheumatoid arthritis with rheumatoid factor of multiple sites without organ or systems involvement: Secondary | ICD-10-CM | POA: Diagnosis not present

## 2018-01-21 DIAGNOSIS — M25511 Pain in right shoulder: Secondary | ICD-10-CM | POA: Diagnosis not present

## 2018-01-24 ENCOUNTER — Encounter: Payer: Self-pay | Admitting: Family Medicine

## 2018-01-24 ENCOUNTER — Ambulatory Visit (INDEPENDENT_AMBULATORY_CARE_PROVIDER_SITE_OTHER): Payer: Medicare Other | Admitting: Family Medicine

## 2018-01-24 VITALS — BP 116/70 | HR 66 | Temp 98.2°F | Wt 225.0 lb

## 2018-01-24 DIAGNOSIS — T300 Burn of unspecified body region, unspecified degree: Secondary | ICD-10-CM | POA: Diagnosis not present
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
## 2018-01-24 NOTE — Patient Instructions (Signed)
Burn Care, Adult  A burn is an injury to the skin or the tissues under the skin. There are three types of burns:  · First degree. These burns may cause the skin to be red and slightly swollen.  · Second degree. These burns are very painful and cause the skin to be very red. The skin may also leak fluid, look shiny, and develop blisters.  · Third degree. These burns cause permanent damage. They either turn the skin white or black and make it look charred, dry, and leathery.    Taking care of your burn properly can help to prevent pain and infection. It can also help the burn to heal more quickly.  What are the risks?  Complications from burns include:  · Damage to the skin.  · Reduced blood flow near the injury.  · Dead tissue.  · Scarring.  · Problems with movement, if the burn happened near a joint or on the hands or feet.    Severe burns can lead to problems that affect the whole body, such as:  · Fluid loss.  · Less blood circulating in the body.  · Inability to maintain a normal core body temperature (thermoregulation).  · Infection.  · Shock.  · Problems breathing.    How to care for a first-degree burn  Right after a burn:  · Rinse or soak the burn under cool water until the pain stops. Do not put ice on your burn. This can cause more damage.  · Lightly cover the burn with a sterile cloth (dressing).  Burn care  · Follow instructions from your health care provider about:  ? How to clean and take care of the burn.  ? When to change and remove the dressing.  · Check your burn every day for signs of infection. Check for:  ? More redness, swelling, or pain.  ? Warmth.  ? Pus or a bad smell.  Medicine  · Take over-the-counter and prescription medicines only as told by your health care provider.  · If you were prescribed antibiotic medicine, take or apply it as told by your health care provider. Do not stop using the antibiotic even if your condition improves.  General instructions  · To prevent infection, do not  put butter, oil, or other home remedies on your burn.  · Do not rub your burn, even when you are cleaning it.  · Protect your burn from the sun.  How to care for a second-degree burn  Right after a burn:  · Rinse or soak the burn under cool water. Do this for several minutes. Do not put ice on your burn. This can cause more damage.  · Lightly cover the burn with a sterile cloth (dressing).  Burn care  · Raise (elevate) the injured area above the level of your heart while sitting or lying down.  · Follow instructions from your health care provider about:  ? How to clean and take care of the burn.  ? When to change and remove the dressing.  · Check your burn every day for signs of infection. Check for:  ? More redness, swelling, or pain.  ? Warmth.  ? Pus or a bad smell.  Medicine    · Take over-the-counter and prescription medicines only as told by your health care provider.  · If you were prescribed antibiotic medicine, take or apply it as told by your health care provider. Do not stop using the antibiotic even if your   condition improves.  General instructions  · To prevent infection:  ? Do not put butter, oil, or other home remedies on the burn.  ? Do not scratch or pick at the burn.  ? Do not break any blisters.  ? Do not peel skin.  · Do not rub your burn, even when you are cleaning it.  · Protect your burn from the sun.  How to care for a third-degree burn  Right after a burn:  · Lightly cover the burn with gauze.  · Seek immediate medical attention.  Burn care  · Raise (elevate) the injured area above the level of your heart while sitting or lying down.  · Drink enough fluid to keep your urine clear or pale yellow.  · Rest as told by your health care provider. Do not participate in sports or other physical activities until your health care provider approves.  · Follow instructions from your health care provider about:  ? How to clean and take care of the burn.  ? When to change and remove the dressing.  · Check  your burn every day for signs of infection. Check for:  ? More redness, swelling, or pain.  ? Warmth.  ? Pus or a bad smell.  Medicine  · Take over-the-counter and prescription medicines only as told by your health care provider.  · If you were prescribed antibiotic medicine, take or apply it as told by your health care provider. Do not stop using the antibiotic even if your condition improves.  General instructions  · To prevent infection:  ? Do not put butter, oil, or other home remedies on the burn.  ? Do not scratch or pick at the burn.  ? Do not break any blisters.  ? Do not peel skin.  ? Do not rub your burn, even when you are cleaning it.  · Protect your burn from the sun.  · Keep all follow-up visits as told by your health care provider. This is important.  Contact a health care provider if:  · Your condition does not improve.  · Your condition gets worse.  · You have a fever.  · Your burn changes in appearance or develops black or red spots.  · Your burn feels warm to the touch.  · Your pain is not controlled with medicine.  Get help right away if:  · You have redness, swelling, or pain at the site of the burn.  · You have fluid, blood, or pus coming from your burn.  · You have red streaks near the burn.  · You have severe pain.  This information is not intended to replace advice given to you by your health care provider. Make sure you discuss any questions you have with your health care provider.  Document Released: 02/20/2005 Document Revised: 09/12/2015 Document Reviewed: 08/10/2015  Elsevier Interactive Patient Education © 2018 [employer]

## 2018-02-06 DIAGNOSIS — Z6837 Body mass index (BMI) 37.0-37.9, adult: Secondary | ICD-10-CM | POA: Diagnosis not present

## 2018-02-06 DIAGNOSIS — I480 Paroxysmal atrial fibrillation: Secondary | ICD-10-CM | POA: Diagnosis not present

## 2018-02-06 DIAGNOSIS — E7800 Pure hypercholesterolemia, unspecified: Secondary | ICD-10-CM | POA: Diagnosis not present

## 2018-02-06 DIAGNOSIS — I10 Essential (primary) hypertension: Secondary | ICD-10-CM | POA: Diagnosis not present

## 2018-02-06 DIAGNOSIS — G250 Essential tremor: Secondary | ICD-10-CM | POA: Diagnosis not present

## 2018-02-06 DIAGNOSIS — E6609 Other obesity due to excess calories: Secondary | ICD-10-CM | POA: Diagnosis not present

## 2018-02-09 ENCOUNTER — Other Ambulatory Visit: Payer: Self-pay | Admitting: Physician Assistant

## 2018-02-09 DIAGNOSIS — I10 Essential (primary) hypertension: Secondary | ICD-10-CM

## 2018-02-12 ENCOUNTER — Ambulatory Visit
Admission: RE | Admit: 2018-02-12 | Discharge: 2018-02-12 | Disposition: A | Discharge status: Home / Self Care | Payer: Medicare Other | Source: Ambulatory Visit | Attending: Physician Assistant | Admitting: Physician Assistant

## 2018-02-12 DIAGNOSIS — Z1231 Encounter for screening mammogram for malignant neoplasm of breast: Secondary | ICD-10-CM | POA: Insufficient documentation

## 2018-02-13 NOTE — Telephone Encounter (Signed)
[facility] faxed refill request for the following medications:  hydrochlorothiazide (HYDRODIURIL) 12.5 MG tablet  Last fill date: 12/22/2017  Please advise.

## 2018-02-19 ENCOUNTER — Other Ambulatory Visit: Payer: Self-pay | Admitting: Physician Assistant

## 2018-03-04 DIAGNOSIS — Z79899 Other long term (current) drug therapy: Secondary | ICD-10-CM | POA: Diagnosis not present

## 2018-03-04 DIAGNOSIS — M0579 Rheumatoid arthritis with rheumatoid factor of multiple sites without organ or systems involvement: Secondary | ICD-10-CM | POA: Diagnosis not present

## 2018-03-27 ENCOUNTER — Ambulatory Visit (INDEPENDENT_AMBULATORY_CARE_PROVIDER_SITE_OTHER): Payer: Medicare Other | Admitting: Family Medicine

## 2018-03-27 ENCOUNTER — Other Ambulatory Visit: Payer: Self-pay | Admitting: Family Medicine

## 2018-03-27 ENCOUNTER — Encounter: Payer: Self-pay | Admitting: Family Medicine

## 2018-03-27 VITALS — BP 134/75 | HR 66 | Temp 98.1°F | Wt 219.0 lb

## 2018-03-27 DIAGNOSIS — G250 Essential tremor: Secondary | ICD-10-CM | POA: Diagnosis not present

## 2018-03-27 DIAGNOSIS — I10 Essential (primary) hypertension: Secondary | ICD-10-CM

## 2018-03-27 DIAGNOSIS — F419 Anxiety disorder, unspecified: Secondary | ICD-10-CM | POA: Diagnosis not present

## 2018-03-27 DIAGNOSIS — E039 Hypothyroidism, unspecified: Secondary | ICD-10-CM

## 2018-03-27 DIAGNOSIS — M069 Rheumatoid arthritis, unspecified: Secondary | ICD-10-CM

## 2018-03-27 DIAGNOSIS — E559 Vitamin D deficiency, unspecified: Secondary | ICD-10-CM

## 2018-03-27 DIAGNOSIS — I4811 Longstanding persistent atrial fibrillation: Secondary | ICD-10-CM | POA: Diagnosis not present

## 2018-03-27 DIAGNOSIS — R739 Hyperglycemia, unspecified: Secondary | ICD-10-CM | POA: Diagnosis not present

## 2018-03-27 DIAGNOSIS — Z79899 Other long term (current) drug therapy: Secondary | ICD-10-CM

## 2018-03-27 DIAGNOSIS — E876 Hypokalemia: Secondary | ICD-10-CM | POA: Diagnosis not present

## 2018-03-27 DIAGNOSIS — E7800 Pure hypercholesterolemia, unspecified: Secondary | ICD-10-CM | POA: Diagnosis not present

## 2018-03-27 NOTE — Assessment & Plan Note (Signed)
Recheck lipid panel Not currently on a statin

## 2018-03-27 NOTE — Assessment & Plan Note (Signed)
Well-controlled Continue Zoloft 150 mg daily

## 2018-03-27 NOTE — Assessment & Plan Note (Signed)
Well controlled Continue current meds Check metabolic panel 

## 2018-03-27 NOTE — Assessment & Plan Note (Signed)
-  Continue propranolol 

## 2018-03-27 NOTE — Assessment & Plan Note (Signed)
Discussed diet and exercise and importance of healthy weight management 

## 2018-03-27 NOTE — Assessment & Plan Note (Signed)
Continue supplement Recheck metabolic panel

## 2018-03-27 NOTE — Assessment & Plan Note (Signed)
Recheck A1c 

## 2018-03-27 NOTE — Patient Instructions (Signed)
Thyroid-Stimulating Hormone Test Why am I having this test? You may have a thyroid-stimulating hormone (TSH) test if you have possible symptoms of abnormal thyroid hormone levels. This test can help your health care provider:  Diagnose a disorder of the thyroid gland or pituitary gland.  Manage your condition and treatment if you have an underactive thyroid (hypothyroidism) or an overactive thyroid (hyperthyroidism). Newborn babies may have this test done to screen for hypothyroidism that is present at birth (congenital). The thyroid is a gland in the lower front of the neck. It makes hormones that affect many body parts and systems, including the system that affects how quickly the body burns fuel for energy (metabolism). The pituitary gland is located just below the brain, behind the eyes and nasal passages. It helps maintain thyroid hormone levels and thyroid gland function. What is being tested? This test measures the amount of TSH in your blood. TSH may also be called thyrotropin. When the thyroid does not make enough hormones, the pituitary gland releases TSH into the bloodstream to stimulate the thyroid gland to make more hormones. What kind of sample is taken?     A blood sample is required for this test. It is usually collected by inserting a needle into a blood vessel. For newborns, a small amount of blood may be collected from the umbilical cord, or by using a small needle to prick the baby's heel (heel stick). Tell a health care provider about:  All medicines you are taking, including vitamins, herbs, eye drops, creams, and over-the-counter medicines.  Any blood disorders you have.  Any surgeries you have had.  Any medical conditions you have.  Whether you are pregnant or may be pregnant. How are the results reported? Your test results will be reported as a value that indicates how much TSH is in your blood. Your health care provider will compare your results to normal ranges  that were established after testing a large group of people (reference ranges). Reference ranges may vary among labs and hospitals. For this test, common reference ranges are:  Adult: 2-10 microunits/mL or 2-10 milliunits/L.  Newborn: ? Heel stick: 3-18 microunits/mL or 3-18 milliunits/L. ? Umbilical cord: 1-94 microunits/mL or 3-12 milliunits/L. What do the results mean? Results that are within the reference range are considered normal. This means that you have a normal amount of TSH in your blood. Results that are higher than the reference range mean that your TSH levels are too high. This may mean:  Your thyroid gland is not making enough thyroid hormones.  Your thyroid medicine dosage is too low.  You have a tumor on your pituitary gland. This is rare. Results that are lower than the reference range mean that your TSH levels are too low. This may be caused by hyperthyroidism or by a problem with the pituitary gland function. Talk with your health care provider about what your results mean. Questions to ask your health care provider Ask your health care provider, or the department that is doing the test:  When will my results be ready?  How will I get my results?  What are my treatment options?  What other tests do I need?  What are my next steps? Summary  You may have a thyroid-stimulating hormone (TSH) test if you have possible symptoms of abnormal thyroid hormone levels.  The thyroid is a gland in the lower front of the neck. It makes hormones that affect many body parts and systems.  The pituitary gland is located  just below the brain, behind the eyes and nasal passages. It helps maintain thyroid hormone levels and thyroid gland function.  This test measures the amount of TSH in your blood. TSH is made by the pituitary gland. It may also be called thyrotropin. This information is not intended to replace advice given to you by your health care provider. Make sure you  discuss any questions you have with your health care provider. Document Released: 03/17/2004 Document Revised: 10/24/2016 Document Reviewed: 10/24/2016 Elsevier Interactive Patient Education  2019 Elsevier Inc.  

## 2018-03-27 NOTE — Assessment & Plan Note (Signed)
Recheck TSH continue synthroid at current dose pending TSH results

## 2018-03-27 NOTE — Progress Notes (Signed)
Patient: Glenda Williams Female    DOB: 1941/01/15   78 y.o.   MRN: [id number] Visit Date: 03/27/2018  Today's Provider: Lavon Paganini, MD   Chief Complaint  Patient presents with  . Anxiety  . Hypothyroidism  . Hypertension   Subjective:    I, Porsha McClurkin, CMA, am acting as a scribe for Lavon Paganini, MD.   HPI  Hypothyroid, follow-up:  TSH  Date Value Ref Range Status  11/04/2017 6.104 (H) 0.350 - 4.500 uIU/mL Final    Comment:    Performed by a 3rd Generation assay with a functional sensitivity of <=0.01 uIU/mL. Performed at [facility], [address] [place], [place] [address]   06/13/2016 3.500 0.450 - 4.500 uIU/mL Final  07/27/2015 2.030 0.450 - 4.500 uIU/mL Final   Wt Readings from Last 3 Encounters:  03/27/18 219 lb (99.3 kg)  01/24/18 225 lb (102.1 kg)  11/07/17 236 lb (107 kg)    She was last seen for hypothyroid 2 months ago.  Management since that visit includes none. She reports good compliance with treatment. She is not having side effects.  She is not exercising. She is experiencing none She denies diarrhea, heat / cold intolerance, nervousness and palpitations Weight trend: decreasing steadily  ------------------------------------------------------------------------  Vitamin D Deficiency Patient reports good compliance with treatment.  Patient with prediabetes.  Diet controlled.  Has never taken any medications for blood sugar.  Taking propranolol for a fib and tremor and Xarelto for A fib.  Has increased fatigue.   Hypertension, follow-up:  BP Readings from Last 3 Encounters:  03/27/18 134/75  01/24/18 116/70  01/19/18 122/70    She was last seen for hypertension 2 months ago.  BP at that visit was 116/70 . Management changes since that visit include none. She reports good compliance with treatment. She is not having side effects.  She is not exercising. She is not adherent to low salt diet.     Outside blood pressures are not being checked. She is experiencing fatigue.  Patient denies chest pain, chest pressure/discomfort, exertional chest pressure/discomfort, lower extremity edema and palpitations.   Cardiovascular risk factors include advanced age (older than 74 for men, 57 for women), hypertension and obesity (BMI >= 30 kg/m2).  Use of agents associated with hypertension: none.     Weight trend: decreasing steadily Wt Readings from Last 3 Encounters:  03/27/18 219 lb (99.3 kg)  01/24/18 225 lb (102.1 kg)  11/07/17 236 lb (107 kg)    Current diet: well balanced  ------------------------------------------------------------------------  Allergies  Allergen Reactions  . Bactrim [Sulfamethoxazole-Trimethoprim] Rash  . Macrobid [Nitrofurantoin Macrocrystal] Rash  . Mirabegron Rash  . Oxybutynin Rash  . Penicillins Rash    Has patient had a PCN reaction causing immediate rash, facial/tongue/throat swelling, SOB or lightheadedness with hypotension: Yes Has patient had a PCN reaction causing severe rash involving mucus membranes or skin necrosis: No Has patient had a PCN reaction that required hospitalization: No Has patient had a PCN reaction occurring within the last 10 years: Unknown If all of the above answers are "NO", then may proceed with Cephalosporin use.      Current Outpatient Medications:  .  cholecalciferol (VITAMIN D) 400 units TABS tablet, Take 400 Units by mouth daily., Disp: , Rfl:  .  EPINEPHrine 0.3 mg/0.3 mL IJ SOAJ injection, Inject 0.3 mLs into the skin once as needed for anaphylaxis., Disp: , Rfl: 1 .  folic acid (FOLVITE) 1 MG tablet,  Take 1 mg by mouth daily., Disp: , Rfl:  .  hydrochlorothiazide (HYDRODIURIL) 12.5 MG tablet, TAKE 1 TABLET BY MOUTH  DAILY, Disp: 90 tablet, Rfl: 1 .  InFLIXimab (REMICADE IV), Inject into the vein. Every 6-8 weeks, Disp: , Rfl:  .  levothyroxine (SYNTHROID, LEVOTHROID) 100 MCG tablet, Take 1 tablet (100 mcg total)  by mouth daily., Disp: 90 tablet, Rfl: 2 .  methotrexate (RHEUMATREX) 2.5 MG tablet, Take 20 mg by mouth every Thursday. , Disp: , Rfl:  .  Omega-3 Fatty Acids (FISH OIL) 1000 MG CAPS, Take 1 capsule by mouth daily., Disp: , Rfl:  .  omeprazole (PRILOSEC) 20 MG capsule, TAKE 1 CAPSULE BY MOUTH 2  TIMES DAILY BEFORE A MEAL., Disp: 180 capsule, Rfl: 0 .  potassium chloride SA (K-DUR,KLOR-CON) 20 MEQ tablet, Take 1 tablet by mouth  daily, Disp: 90 tablet, Rfl: 1 .  propranolol (INDERAL) 40 MG tablet, 1 tablets in the am, 0.5 tablet in pm, Disp: 60 tablet, Rfl: 0 .  Red Yeast Rice 600 MG CAPS, Take 1 capsule by mouth daily., Disp: , Rfl:  .  rivaroxaban (XARELTO) 20 MG TABS tablet, TAKE 1 TABLET BY MOUTH ONCE DAILY, Disp: , Rfl:  .  sertraline (ZOLOFT) 50 MG tablet, TAKE 3 TABLETS BY MOUTH  DAILY, Disp: 270 tablet, Rfl: 3 .  hydrocortisone cream 1 %, Apply 1 application topically as needed for itching., Disp: , Rfl:  .  mupirocin ointment (BACTROBAN) 2 %, Apply 1 application topically 2 (two) times daily. (Patient not taking: Reported on 03/27/2018), Disp: 22 g, Rfl: 0  Review of Systems  Constitutional: Negative.   HENT: Negative.   Respiratory: Negative.   Cardiovascular: Negative.   Gastrointestinal: Negative.   Endocrine: Negative.   Musculoskeletal: Negative.   Skin: Negative.   Neurological: Negative.   Psychiatric/Behavioral: Negative.     Social History   Tobacco Use  . Smoking status: Never Smoker  . Smokeless tobacco: Never Used  Substance Use Topics  . Alcohol use: No      Objective:   BP 134/75 (BP Location: Left Arm, Patient Position: Sitting, Cuff Size: Large)   Pulse 66   Temp 98.1 F (36.7 C) (Oral)   Wt 219 lb (99.3 kg)   SpO2 99%   BMI 35.35 kg/m  Vitals:   03/27/18 0844  BP: 134/75  Pulse: 66  Temp: 98.1 F (36.7 C)  TempSrc: Oral  SpO2: 99%  Weight: 219 lb (99.3 kg)     Physical Exam Vitals signs reviewed.  Constitutional:      General: She is  not in acute distress.    Appearance: She is not diaphoretic.  HENT:     Head: Normocephalic and atraumatic.  Eyes:     General: No scleral icterus.    Conjunctiva/sclera: Conjunctivae normal.  Neck:     Musculoskeletal: Neck supple.  Cardiovascular:     Rate and Rhythm: Normal rate and regular rhythm.     Pulses: Normal pulses.     Heart sounds: Normal heart sounds. No murmur.  Pulmonary:     Effort: Pulmonary effort is normal. No respiratory distress.     Breath sounds: Normal breath sounds. No wheezing or rhonchi.  Abdominal:     General: There is no distension.     Palpations: Abdomen is soft.     Tenderness: There is no abdominal tenderness.  Musculoskeletal:     Right lower leg: No edema.     Left lower leg: No edema.  Lymphadenopathy:     Cervical: No cervical adenopathy.  Skin:    General: Skin is warm and dry.     Capillary Refill: Capillary refill takes less than 2 seconds.     Findings: No rash.  Neurological:     Mental Status: She is alert and oriented to person, place, and time. Mental status is at baseline.  Psychiatric:        Mood and Affect: Mood normal.        Behavior: Behavior normal.         Assessment & Plan   Problem List Items Addressed This Visit      Cardiovascular and Mediastinum   Atrial fibrillation ([place])    Rate controlled Continue beta blocker and Xarelto      Hypertension    Well controlled Continue current meds Check metabolic panel      Relevant Orders   Basic Metabolic Panel (BMET)     Endocrine   Adult hypothyroidism    Recheck TSH continue synthroid at current dose pending TSH results      Relevant Orders   TSH     Nervous and Auditory   Benign essential tremor    Continue propranolol        Musculoskeletal and Integument   Rheumatoid arthritis (HCC)    Followed by rheumatology On DMARDs        Other   Anxiety    Well-controlled Continue Zoloft 150 mg daily      Prediabetes - Primary     Recheck A1c      Hypercholesteremia    Recheck lipid panel Not currently on a statin      Relevant Orders   Lipid panel   Avitaminosis D    Continue supplement Recheck vitamin D levels      Relevant Orders   VITAMIN D 25 Hydroxy (Vit-D Deficiency, Fractures)   Hypokalemia    Continue supplement Recheck metabolic panel      Relevant Orders   Basic Metabolic Panel (BMET)   Morbid obesity ([place])    Discussed diet and exercise and importance of healthy weight management       Other Visit Diagnoses    Long-term use of high-risk medication       Relevant Orders   VITAMIN D 25 Hydroxy (Vit-D Deficiency, Fractures)       Return if symptoms worsen or fail to improve.   The entirety of the information documented in the History of Present Illness, Review of Systems and Physical Exam were personally obtained by me. Portions of this information were initially documented by Rayna Sexton, CMA and reviewed by me for thoroughness and accuracy.    Virginia Crews, MD, MPH [facility] 03/27/2018 3:52 PM

## 2018-03-27 NOTE — Assessment & Plan Note (Signed)
Continue supplement Recheck vitamin D levels

## 2018-03-27 NOTE — Assessment & Plan Note (Signed)
Followed by rheumatology On DMARDs

## 2018-03-27 NOTE — Assessment & Plan Note (Signed)
Rate controlled Continue beta blocker and Xarelto

## 2018-03-28 ENCOUNTER — Telehealth: Payer: Self-pay

## 2018-03-28 LAB — BASIC METABOLIC PANEL
BUN / CREAT RATIO: 15 (ref 12–28)
BUN: 11 mg/dL (ref 8–27)
CO2: 25 mmol/L (ref 20–29)
Calcium: 9.7 mg/dL (ref 8.7–10.3)
Chloride: 102 mmol/L (ref 96–106)
Creatinine, Ser: 0.73 mg/dL (ref 0.57–1.00)
GFR calc Af Amer: 93 mL/min/{1.73_m2} (ref >59)
GFR, EST NON AFRICAN AMERICAN: 80 mL/min/{1.73_m2} (ref >59)
Glucose: 98 mg/dL (ref 65–99)
Potassium: 4.8 mmol/L (ref 3.5–5.2)
Sodium: 143 mmol/L (ref 134–144)

## 2018-03-28 LAB — TSH: TSH: 2.54 u[IU]/mL (ref 0.450–4.500)

## 2018-03-28 LAB — LIPID PANEL
Chol/HDL Ratio: 4.2 ratio (ref 0.0–4.4)
Cholesterol, Total: 162 mg/dL (ref 100–199)
HDL: 39 mg/dL — ABNORMAL LOW (ref >39)
LDL Calculated: 106 mg/dL — ABNORMAL HIGH (ref 0–99)
Triglycerides: 84 mg/dL (ref 0–149)
VLDL Cholesterol Cal: 17 mg/dL (ref 5–40)

## 2018-03-28 LAB — HEMOGLOBIN A1C
Est. average glucose Bld gHb Est-mCnc: 120 mg/dL
Hgb A1c MFr Bld: 5.8 % — ABNORMAL HIGH (ref 4.8–5.6)

## 2018-03-28 LAB — VITAMIN D 25 HYDROXY (VIT D DEFICIENCY, FRACTURES): Vit D, 25-Hydroxy: 33.4 ng/mL (ref 30.0–100.0)

## 2018-03-28 NOTE — Telephone Encounter (Signed)
Attempted to contact patient, no answer nor voicemail.

## 2018-03-28 NOTE — Telephone Encounter (Signed)
-----   Message from Virginia Crews, MD sent at 03/28/2018  4:37 PM EST ----- Labs are all stable.  No changes to medications

## 2018-04-02 NOTE — Telephone Encounter (Signed)
Viewed by [name] on 03/29/2018 8:01 PM

## 2018-04-15 DIAGNOSIS — M0579 Rheumatoid arthritis with rheumatoid factor of multiple sites without organ or systems involvement: Secondary | ICD-10-CM | POA: Diagnosis not present

## 2018-05-18 ENCOUNTER — Other Ambulatory Visit: Payer: Self-pay | Admitting: Family Medicine

## 2018-05-27 DIAGNOSIS — M79641 Pain in right hand: Secondary | ICD-10-CM | POA: Diagnosis not present

## 2018-05-27 DIAGNOSIS — Z79899 Other long term (current) drug therapy: Secondary | ICD-10-CM | POA: Diagnosis not present

## 2018-05-27 DIAGNOSIS — M0579 Rheumatoid arthritis with rheumatoid factor of multiple sites without organ or systems involvement: Secondary | ICD-10-CM | POA: Diagnosis not present

## 2018-05-27 DIAGNOSIS — M65321 Trigger finger, right index finger: Secondary | ICD-10-CM | POA: Diagnosis not present

## 2018-07-08 ENCOUNTER — Other Ambulatory Visit: Payer: Self-pay

## 2018-07-08 ENCOUNTER — Encounter: Payer: Medicare Other | Admitting: Family Medicine

## 2018-07-08 ENCOUNTER — Ambulatory Visit (INDEPENDENT_AMBULATORY_CARE_PROVIDER_SITE_OTHER): Payer: Medicare Other

## 2018-07-08 DIAGNOSIS — Z0000 Encounter for general adult medical examination without abnormal findings: Secondary | ICD-10-CM | POA: Diagnosis not present

## 2018-07-08 NOTE — Patient Instructions (Addendum)
Glenda Williams , Thank you for taking time to come for your Medicare Wellness Visit. I appreciate your ongoing commitment to your health goals. Please review the following plan we discussed and let me know if I can assist you in the future.   Screening recommendations/referrals: Colonoscopy: No longer required.  Mammogram: No longer required.  Bone Density: Up to date, due 11/2018 Recommended yearly ophthalmology/optometry visit for glaucoma screening and checkup Recommended yearly dental visit for hygiene and checkup  Vaccinations: Influenza vaccine: Up to date Pneumococcal vaccine: Completed series Tdap vaccine: Pt declines today.  Shingles vaccine: Pt declines today.     Advanced directives: Please bring a copy of your POA (Power of Attorney) and/or Living Will to your next appointment.   Conditions/risks identified: Continue to work on a diabetic diet and avoid heavy carbohydrates and sugars to help aid in weight loss.   Next appointment: 11/07/18 @ 9:00 AM with Dr Brita Romp. Declined scheduling the AWV for 2021 at this time.    Preventive Care 77 Years and Older, Female Older, Female Preventive care refers to lifestyle choices and visits with your health care provider that can promote health and wellness. What does preventive care include?  A yearly physical exam. This is also called an annual well check.  Dental exams once or twice a year.  Routine eye exams. Ask your health care provider how often you should have your eyes checked.  Personal lifestyle choices, including:  Daily care of your teeth and gums.  Regular physical activity.  Eating a healthy diet.  Avoiding tobacco and drug use.  Limiting alcohol use.  Practicing safe sex.  Taking low-dose aspirin every day.  Taking vitamin and mineral supplements as recommended by your health care provider. What happens during an annual well check? The services and screenings done by your health care provider during your annual  well check will depend on your age, overall health, lifestyle risk factors, and family history of disease. Counseling  Your health care provider may ask you questions about your:  Alcohol use.  Tobacco use.  Drug use.  Emotional well-being.  Home and relationship well-being.  Sexual activity.  Eating habits.  History of falls.  Memory and ability to understand (cognition).  Work and work Statistician.  Reproductive health. Screening  You may have the following tests or measurements:  Height, weight, and BMI.  Blood pressure.  Lipid and cholesterol levels. These may be checked every 5 years, or more frequently if you are over 77 years old.  Skin check.  Lung cancer screening. You may have this screening every year starting at age 77 if you have a 30-pack-year history of smoking and currently smoke or have quit within the past 15 years.  Fecal occult blood test (FOBT) of the stool. You may have this test every year starting at age 77.  Flexible sigmoidoscopy or colonoscopy. You may have a sigmoidoscopy every 5 years or a colonoscopy every 10 years starting at age 77.  Hepatitis C blood test.  Hepatitis B blood test.  Sexually transmitted disease (STD) testing.  Diabetes screening. This is done by checking your blood sugar (glucose) after you have not eaten for a while (fasting). You may have this done every 1-3 years.  Bone density scan. This is done to screen for osteoporosis. You may have this done starting at age 77.  Mammogram. This may be done every 1-2 years. Talk to your health care provider about how often you should have regular mammograms. Talk with your health  care provider about your test results, treatment options, and if necessary, the need for more tests. Vaccines  Your health care provider may recommend certain vaccines, such as:  Influenza vaccine. This is recommended every year.  Tetanus, diphtheria, and acellular pertussis (Tdap, Td) vaccine.  You may need a Td booster every 10 years.  Zoster vaccine. You may need this after age 77.  Pneumococcal 13-valent conjugate (PCV13) vaccine. One dose is recommended after age 77.  Pneumococcal polysaccharide (PPSV23) vaccine. One dose is recommended after age 77. Talk to your health care provider about which screenings and vaccines you need and how often you need them. This information is not intended to replace advice given to you by your health care provider. Make sure you discuss any questions you have with your health care provider. Document Released: 03/19/2015 Document Revised: 11/10/2015 Document Reviewed: 12/22/2014 Elsevier Interactive Patient Education  2017 [place] Prevention in the Home Falls can cause injuries. They can happen to people of all ages. There are many things you can do to make your home safe and to help prevent falls. What can I do on the outside of my home?  Regularly fix the edges of walkways and driveways and fix any cracks.  Remove anything that might make you trip as you walk through a door, such as a raised step or threshold.  Trim any bushes or trees on the path to your home.  Use bright outdoor lighting.  Clear any walking paths of anything that might make someone trip, such as rocks or tools.  Regularly check to see if handrails are loose or broken. Make sure that both sides of any steps have handrails.  Any raised decks and porches should have guardrails on the edges.  Have any leaves, snow, or ice cleared regularly.  Use sand or salt on walking paths during winter.  Clean up any spills in your garage right away. This includes oil or grease spills. What can I do in the bathroom?  Use night lights.  Install grab bars by the toilet and in the tub and shower. Do not use towel bars as grab bars.  Use non-skid mats or decals in the tub or shower.  If you need to sit down in the shower, use a plastic, non-slip stool.  Keep the  floor dry. Clean up any water that spills on the floor as soon as it happens.  Remove soap buildup in the tub or shower regularly.  Attach bath mats securely with double-sided non-slip rug tape.  Do not have throw rugs and other things on the floor that can make you trip. What can I do in the bedroom?  Use night lights.  Make sure that you have a light by your bed that is easy to reach.  Do not use any sheets or blankets that are too big for your bed. They should not hang down onto the floor.  Have a firm chair that has side arms. You can use this for support while you get dressed.  Do not have throw rugs and other things on the floor that can make you trip. What can I do in the kitchen?  Clean up any spills right away.  Avoid walking on wet floors.  Keep items that you use a lot in easy-to-reach places.  If you need to reach something above you, use a strong step stool that has a grab bar.  Keep electrical cords out of the way.  Do not use floor  polish or wax that makes floors slippery. If you must use wax, use non-skid floor wax.  Do not have throw rugs and other things on the floor that can make you trip. What can I do with my stairs?  Do not leave any items on the stairs.  Make sure that there are handrails on both sides of the stairs and use them. Fix handrails that are broken or loose. Make sure that handrails are as long as the stairways.  Check any carpeting to make sure that it is firmly attached to the stairs. Fix any carpet that is loose or worn.  Avoid having throw rugs at the top or bottom of the stairs. If you do have throw rugs, attach them to the floor with carpet tape.  Make sure that you have a light switch at the top of the stairs and the bottom of the stairs. If you do not have them, ask someone to add them for you. What else can I do to help prevent falls?  Wear shoes that:  Do not have high heels.  Have rubber bottoms.  Are comfortable and fit  you well.  Are closed at the toe. Do not wear sandals.  If you use a stepladder:  Make sure that it is fully opened. Do not climb a closed stepladder.  Make sure that both sides of the stepladder are locked into place.  Ask someone to hold it for you, if possible.  Clearly mark and make sure that you can see:  Any grab bars or handrails.  First and last steps.  Where the edge of each step is.  Use tools that help you move around (mobility aids) if they are needed. These include:  Canes.  Walkers.  Scooters.  Crutches.  Turn on the lights when you go into a dark area. Replace any light bulbs as soon as they burn out.  Set up your furniture so you have a clear path. Avoid moving your furniture around.  If any of your floors are uneven, fix them.  If there are any pets around you, be aware of where they are.  Review your medicines with your doctor. Some medicines can make you feel dizzy. This can increase your chance of falling. Ask your doctor what other things that you can do to help prevent falls. This information is not intended to replace advice given to you by your health care provider. Make sure you discuss any questions you have with your health care provider. Document Released: 12/17/2008 Document Revised: 07/29/2015 Document Reviewed: 03/27/2014 Elsevier Interactive Patient Education  2017 [employer].

## 2018-07-08 NOTE — Progress Notes (Addendum)
Subjective:   Glenda Williams is a 78 y.o. female who presents for Medicare Annual (Subsequent) preventive examination.    This visit is being conducted through telemedicine due to the COVID-19 pandemic. This patient has given me verbal consent via doximity to conduct this visit, patient states they are participating from their home address. Some vital signs may be absent or patient reported.    Patient identification: identified by name, DOB, and current address  Review of Systems:  N/A  Cardiac Risk Factors include: advanced age (>56men, >84 women);dyslipidemia;hypertension;obesity (BMI >30kg/m2)     Objective:     Vitals: There were no vitals taken for this visit.  There is no height or weight on file to calculate BMI. Unable to obtain vitals due to visit being conducted via telephonically.   Advanced Directives 07/08/2018 11/04/2017 11/02/2017 07/05/2017 06/27/2016 05/23/2016 04/26/2015  Does Patient Have a Medical Advance Directive? Yes Yes Yes Yes Yes Yes Yes  Type of Advance Directive Living will [place];Living will Living will [place];Living will [place];Living will [place];Living will Living will;Healthcare Power of Attorney  Does patient want to make changes to medical advance directive? - No - Patient declined - - No - Patient declined No - Patient declined -  Copy of Healthcare Power of Attorney in Chart? - No - copy requested - No - copy requested - No - copy requested -    Tobacco Social History   Tobacco Use  Smoking Status Never Smoker  Smokeless Tobacco Never Used     Counseling given: Not Answered   Clinical Intake:  Pre-visit preparation completed: Yes  Pain : No/denies pain Pain Score: 0-No pain     Nutritional Status: BMI > 30  Obese Nutritional Risks: None Diabetes: No  How often do you need to have someone help you when you read instructions, pamphlets, or other written  materials from your doctor or pharmacy?: 1 - Never  Interpreter Needed?: No  Information entered by :: [facility], LPN  Past Medical History:  Diagnosis Date  . A-fib ([place])   . Arthritis   . GERD (gastroesophageal reflux disease)   . Heart disease   . Rheumatoid arthritis ([place])   . Thyroid disease   . Tremors of nervous system    Past Surgical History:  Procedure Laterality Date  . BREAST BIOPSY Left 1988   Benign  . BREAST BIOPSY Left 1987   Benign  . BROW LIFT Bilateral 05/23/2016   Procedure: BLEPHAROPLASTY upper eyelid; w/excess skin;  Surgeon: Karle Starch, MD;  Location: [facility];  Service: Ophthalmology;  Laterality: Bilateral;  . CARPAL TUNNEL RELEASE Bilateral   . CHOLECYSTECTOMY     Dr. Bary Castilla  . COLONOSCOPY  2006  . JOINT REPLACEMENT Bilateral    knees  . REPLACEMENT TOTAL KNEE Left 2005  . REPLACEMENT TOTAL KNEE Right 1998   Family History  Problem Relation Age of Onset  . Stroke Mother   . Hypertension Mother   . Heart disease Father   . Arthritis Father   . Parkinson's disease Sister   . Parkinson's disease Brother   . Bladder Cancer Brother   . Breast cancer Neg Hx   . Kidney cancer Neg Hx    Social History   Socioeconomic History  . Marital status: Married    Spouse name: Not on file  . Number of children: 2  . Years of education: Not on file  . Highest  education level: Some college, no degree  Occupational History  . Not on file  Social Needs  . Financial resource strain: Not hard at all  . Food insecurity:    Worry: Never true    Inability: Never true  . Transportation needs:    Medical: No    Non-medical: No  Tobacco Use  . Smoking status: Never Smoker  . Smokeless tobacco: Never Used  Substance and Sexual Activity  . Alcohol use: No  . Drug use: No  . Sexual activity: Not on file  Lifestyle  . Physical activity:    Days per week: 0 days    Minutes per session: 0 min  . Stress: Not at all  Relationships  . Social  connections:    Talks on phone: Patient refused    Gets together: Patient refused    Attends religious service: Patient refused    Active member of club or organization: Patient refused    Attends meetings of clubs or organizations: Patient refused    Relationship status: Patient refused  Other Topics Concern  . Not on file  Social History Narrative  . Not on file    Outpatient Encounter Medications as of 07/08/2018  Medication Sig  . cholecalciferol (VITAMIN D) 400 units TABS tablet Take 400 Units by mouth daily.  Marland Kitchen EPINEPHrine 0.3 mg/0.3 mL IJ SOAJ injection Inject 0.3 mLs into the skin once as needed for anaphylaxis.  . folic acid (FOLVITE) 1 MG tablet Take 1 mg by mouth daily.  . hydrochlorothiazide (HYDRODIURIL) 12.5 MG tablet TAKE 1 TABLET BY MOUTH  DAILY  . hydrocortisone cream 1 % Apply 1 application topically as needed for itching.  . InFLIXimab (REMICADE IV) Inject into the vein. Every 6-8 weeks  . levothyroxine (SYNTHROID, LEVOTHROID) 100 MCG tablet Take 1 tablet (100 mcg total) by mouth daily.  . methotrexate (RHEUMATREX) 2.5 MG tablet Take 20 mg by mouth every Thursday.   . Omega-3 Fatty Acids (FISH OIL) 1000 MG CAPS Take 1 capsule by mouth daily.  Marland Kitchen omeprazole (PRILOSEC) 20 MG capsule TAKE 1 CAPSULE BY MOUTH 2  TIMES DAILY BEFORE A MEAL.  Marland Kitchen potassium chloride SA (K-DUR,KLOR-CON) 20 MEQ tablet Take 1 tablet by mouth  daily  . propranolol (INDERAL) 40 MG tablet 1 tablets in the am, 0.5 tablet in pm (Patient taking differently: Take 40 mg by mouth 2 (two) times daily. 2 tablets in the am, 1 tablet in pm)  . Red Yeast Rice 600 MG CAPS Take 1 capsule by mouth daily.  . rivaroxaban (XARELTO) 20 MG TABS tablet TAKE 1 TABLET BY MOUTH ONCE DAILY  . sertraline (ZOLOFT) 50 MG tablet TAKE 3 TABLETS BY MOUTH  DAILY  . mupirocin ointment (BACTROBAN) 2 % Apply 1 application topically 2 (two) times daily. (Patient not taking: Reported on 03/27/2018)   No facility-administered encounter  medications on file as of 07/08/2018.     Activities of Daily Living In your present state of health, do you have any difficulty performing the following activities: 07/08/2018 11/04/2017  Hearing? N N  Vision? N N  Difficulty concentrating or making decisions? N N  Walking or climbing stairs? N Y  Dressing or bathing? N N  Doing errands, shopping? N Y  Conservation officer, nature and eating ? N -  Using the Toilet? N -  In the past six months, have you accidently leaked urine? Y -  Comment Wears protection daily.  -  Do you have problems with loss of bowel control? N -  Managing your Medications? N -  Managing your Finances? N -  Housekeeping or managing your Housekeeping? N -  Some recent data might be hidden    Patient Care Team: Brita Romp Dionne Bucy, MD as PCP - General (Family Medicine) Bary Castilla, Forest Gleason, MD (General Surgery) Teodoro Spray, MD as Consulting Physician (Cardiology) Emmaline Kluver., MD as Consulting Physician (Rheumatology) Vladimir Crofts, MD as Consulting Physician (Neurology) Hollice Espy, MD as Consulting Physician (Urology) Paulene Floor as Physician Assistant (Physician Assistant)    Assessment:   This is a routine wellness examination for [place].  Exercise Activities and Dietary recommendations Current Exercise Habits: Home exercise routine, Type of exercise: stretching;walking, Time (Minutes): 15, Frequency (Times/Week): 7, Weekly Exercise (Minutes/Week): 105, Intensity: Mild, Exercise limited by: None identified  Goals    . DIET - REDUCE CARBOHYDRATE INTAKE     Recommend cutting carbohydrates in half and cutting sugars out completely.        Fall Risk: Fall Risk  07/08/2018 07/08/2018 07/05/2017 11/23/2015 11/09/2015  Falls in the past year? 1 0 No No Yes  Comment - - - - Emmi Telephone Survey: data to providers prior to load  Number falls in past yr: 0 - - - 2 or more  Comment - - - - Emmi Telephone Survey Actual Response = 9  Injury with Fall?  0 - - - No  Follow up Falls prevention discussed - - - -    FALL RISK PREVENTION PERTAINING TO THE HOME:  Any stairs in or around the home? Yes  If so, are there any without handrails? Yes   Home free of loose throw rugs in walkways, pet beds, electrical cords, etc? Yes  Adequate lighting in your home to reduce risk of falls? Yes   ASSISTIVE DEVICES UTILIZED TO PREVENT FALLS:  Life alert? No  Use of a cane, walker or w/c? Yes  Grab bars in the bathroom? No  Shower chair or bench in shower? Yes  Elevated toilet seat or a handicapped toilet? Yes    TIMED UP AND GO:  Was the test performed? No .    Depression Screen PHQ 2/9 Scores 07/08/2018 07/08/2018 07/05/2017 11/23/2015  PHQ - 2 Score 0 0 0 0  Exception Documentation - - - -     Cognitive Function     6CIT Screen 07/08/2018 07/05/2017  What Year? 0 points 0 points  What month? 0 points 0 points  What time? 0 points 0 points  Count back from 20 0 points 0 points  Months in reverse 0 points 0 points  Repeat phrase 0 points 0 points  Total Score 0 0    Immunization History  Administered Date(s) Administered  . Influenza, High Dose Seasonal PF 12/11/2014, 12/13/2016, 12/27/2017  . Influenza-Unspecified 11/03/2015  . Pneumococcal Conjugate-13 02/23/2014  . Pneumococcal Polysaccharide-23 07/05/2017  . Td 04/21/1999  . Tdap 04/21/1999    Qualifies for Shingles Vaccine? Yes . Due for Shingrix. Pt has been advised to call insurance company to determine out of pocket expense. Advised may also receive vaccine at local pharmacy or Health Dept. Verbalized acceptance and understanding.  Tdap: Although this vaccine is not a covered service during a Wellness Exam, does the patient still wish to receive this vaccine today?  No .  Advised may receive this vaccine at local pharmacy or Health Dept. Aware to provide a copy of the vaccination record if obtained from local pharmacy or Health Dept. Verbalized acceptance and understanding.  Flu Vaccine: Up to date  Pneumococcal Vaccine: Up to date  Screening Tests Health Maintenance  Topic Date Due  . TETANUS/TDAP  01/25/2023 (Originally 04/20/2009)  . INFLUENZA VACCINE  10/05/2018  . DEXA SCAN  11/17/2018  . PNA vac Low Risk Adult  Completed    Cancer Screenings:  Colorectal Screening: No longer required.   Mammogram: No longer required.   Bone Density: Completed 11/16/08. Results reflect NORMAL. Repeat every 10 years.   Lung Cancer Screening: (Low Dose CT Chest recommended if Age 12-80 years, 30 pack-year currently smoking OR have quit w/in 15years.) does not qualify.   Additional Screening:  Vision Screening: Recommended annual ophthalmology exams for early detection of glaucoma and other disorders of the eye.  Dental Screening: Recommended annual dental exams for proper oral hygiene  Community Resource Referral:  CRR required this visit?  No       Plan:  I have personally reviewed and addressed the Medicare Annual Wellness questionnaire and have noted the following in the patient's chart:  A. Medical and social history B. Use of alcohol, tobacco or illicit drugs  C. Current medications and supplements D. Functional ability and status E.  Nutritional status F.  Physical activity G. Advance directives H. List of other physicians I.  Hospitalizations, surgeries, and ER visits in previous 12 months J.  [place] such as hearing and vision if needed, cognitive and depression L. Referrals and appointments   In addition, I have reviewed and discussed with patient certain preventive protocols, quality metrics, and best practice recommendations. A written personalized care plan for preventive services as well as general preventive health recommendations were provided to patient. Nurse Health Advisor  Signed,    Tmya Wigington [place], [place]  05/05/5496 Nurse Health Advisor   Nurse Notes: Declined tetanus vaccine today. Pt requested Xarelto 20 mg  samples. Placed 4 bottles up front for p/u.

## 2018-07-09 DIAGNOSIS — M0579 Rheumatoid arthritis with rheumatoid factor of multiple sites without organ or systems involvement: Secondary | ICD-10-CM | POA: Diagnosis not present

## 2018-07-17 ENCOUNTER — Other Ambulatory Visit: Payer: Self-pay | Admitting: Family Medicine

## 2018-08-19 DIAGNOSIS — E7800 Pure hypercholesterolemia, unspecified: Secondary | ICD-10-CM | POA: Diagnosis not present

## 2018-08-19 DIAGNOSIS — I480 Paroxysmal atrial fibrillation: Secondary | ICD-10-CM | POA: Diagnosis not present

## 2018-08-19 DIAGNOSIS — G250 Essential tremor: Secondary | ICD-10-CM | POA: Diagnosis not present

## 2018-08-19 DIAGNOSIS — I10 Essential (primary) hypertension: Secondary | ICD-10-CM | POA: Diagnosis not present

## 2018-08-20 DIAGNOSIS — M0579 Rheumatoid arthritis with rheumatoid factor of multiple sites without organ or systems involvement: Secondary | ICD-10-CM | POA: Diagnosis not present

## 2018-08-20 DIAGNOSIS — Z79899 Other long term (current) drug therapy: Secondary | ICD-10-CM | POA: Diagnosis not present

## 2018-08-31 IMAGING — CR DG CHEST 2V
1 series · 2 of 2 positions shown · non-contrast
Comparison: Chest radiograph 06/13/2016.

CLINICAL DATA: Cough, shortness of breath, fever.

EXAM:
CHEST - 2 VIEW

[Series 1: dg chest 2 view · 0.14mm/px · 2 of 2 slices shown]
[im 1/2]
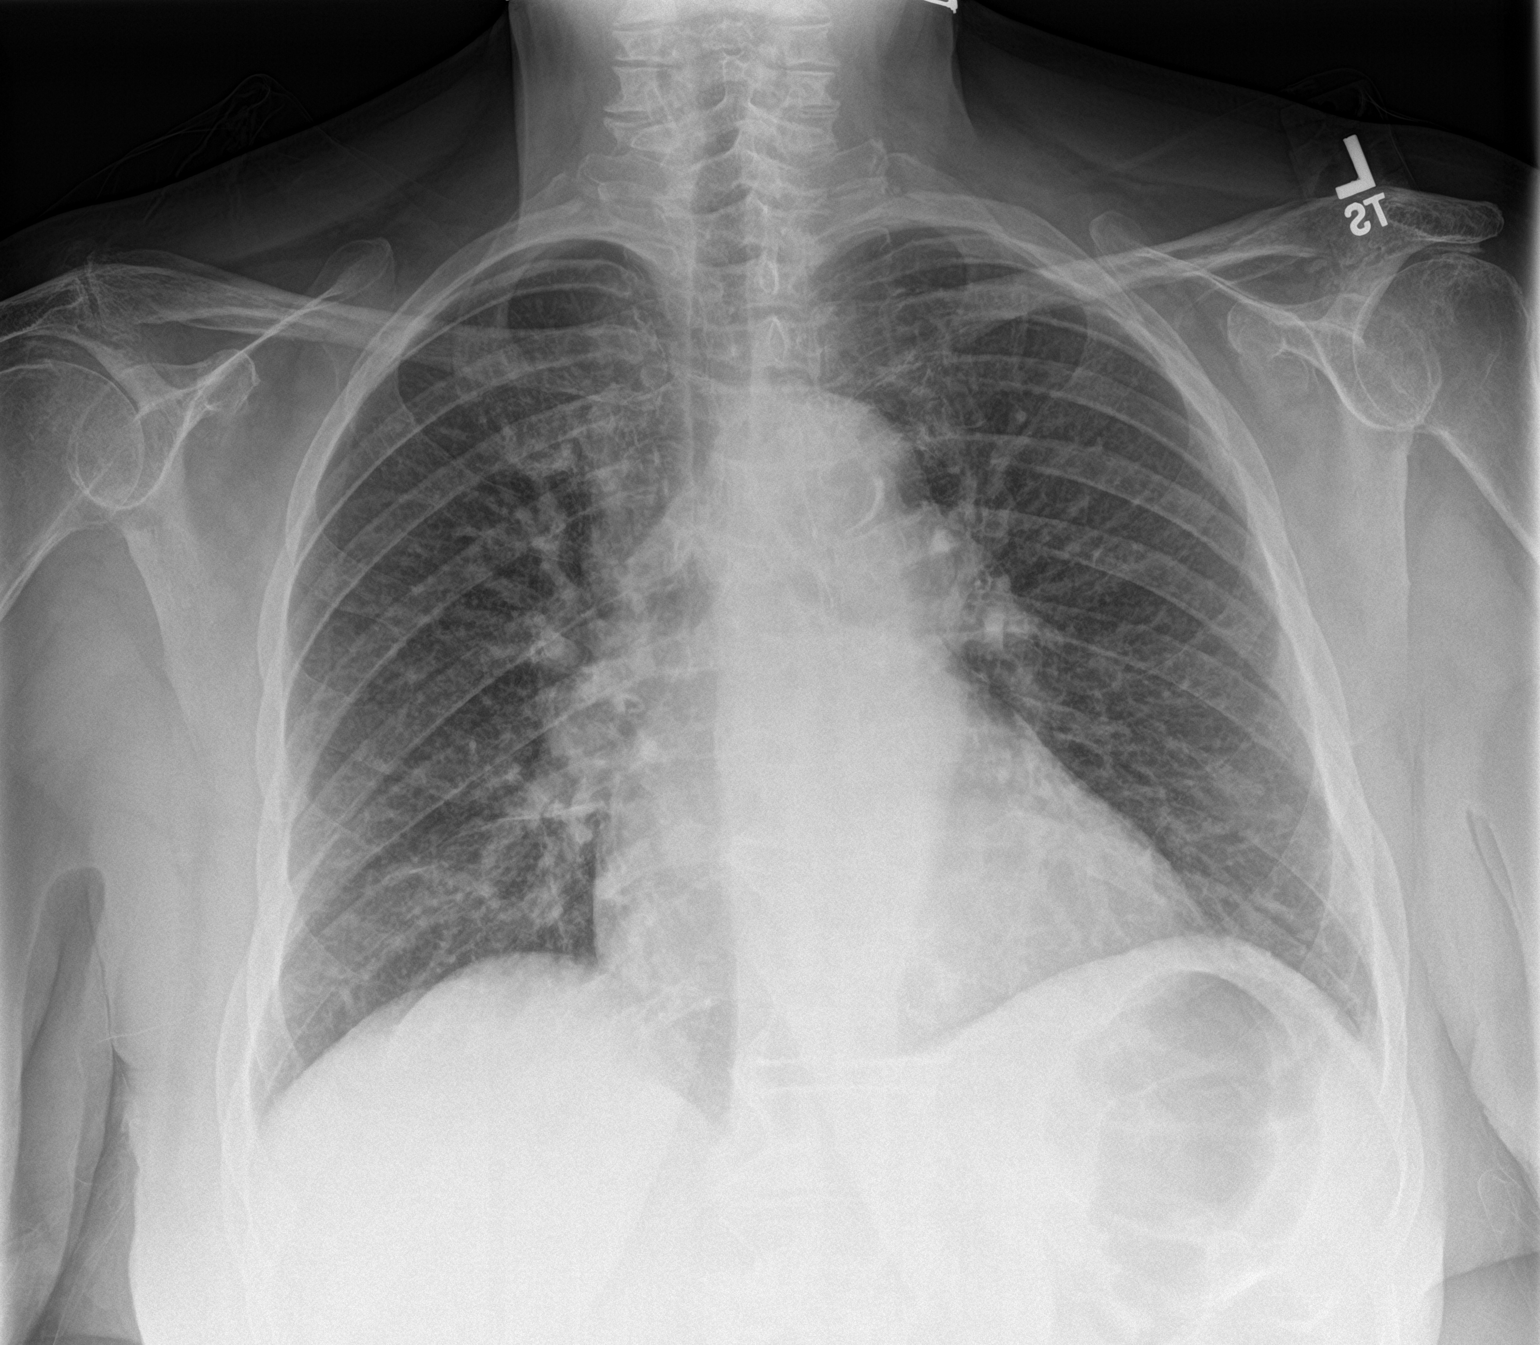
[im 2/2]
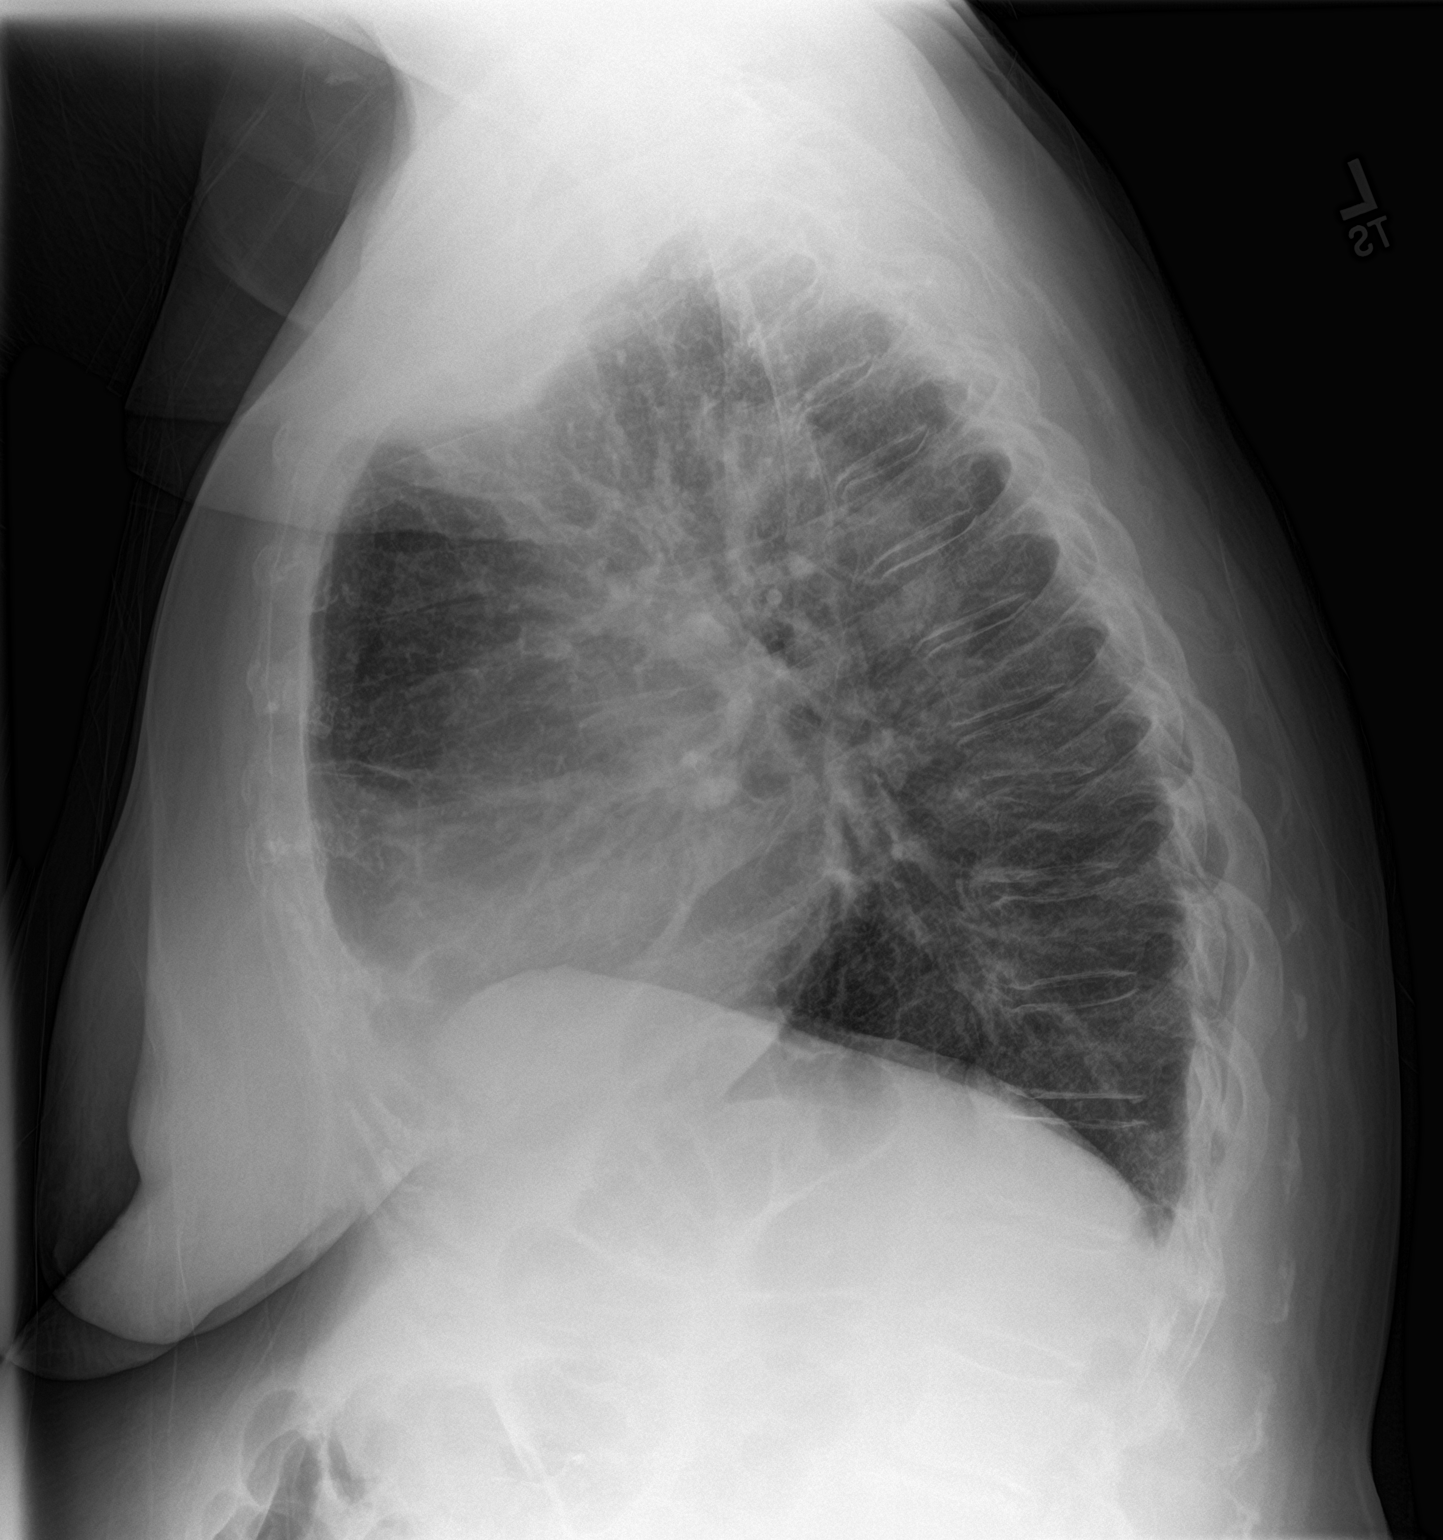

[2 of 2 positions shown; findings below may reference images not displayed]

FINDINGS: Normal cardiac and mediastinal contours. Aortic atherosclerosis.
Bibasilar atelectasis. No pleural effusion or pneumothorax. Thoracic
spine degenerative changes.
IMPRESSION: No acute cardiopulmonary process.

## 2018-09-04 ENCOUNTER — Telehealth: Payer: Self-pay | Admitting: Family Medicine

## 2018-09-04 NOTE — Chronic Care Management (AMB) (Signed)
°  Chronic Care Management   Outreach Note  09/04/2018 Name: Glenda Williams MRN: [id number] DOB: 1940/03/24  Referred by: Virginia Crews, MD Reason for referral : Chronic Care Management (Initial CCM outreach was unsuccessful. )   An unsuccessful telephone outreach was attempted today. The patient was referred to the case management team by for assistance with chronic care management and care coordination.   Follow Up Plan: The care management team will reach out to the patient again over the next 7 days.   [place]  [email or web address]   ??[phone]

## 2018-09-13 NOTE — Chronic Care Management (AMB) (Signed)
Chronic Care Management   Note  09/13/2018 Name: Glenda Williams MRN: [id number] DOB: 05/28/1940  Glenda Williams is a 78 y.o. year old female who is a primary care patient of Bacigalupo, Dionne Bucy, MD. I reached out to Glenda Williams by phone today in response to a referral sent by Ms. Johnsie [employer] health plan.    [name] was given information about Chronic Care Management services today including:  1. CCM service includes personalized support from designated clinical staff supervised by her physician, including individualized plan of care and coordination with other care providers 2. 24/7 contact phone numbers for assistance for urgent and routine care needs. 3. Service will only be billed when office clinical staff spend 20 minutes or more in a month to coordinate care. 4. Only one practitioner may furnish and bill the service in a calendar month. 5. The patient may stop CCM services at any time (effective at the end of the month) by phone call to the office staff. 6. The patient will be responsible for cost sharing (co-pay) of up to 20% of the service fee (after annual deductible is met).  Patient agreed to services and verbal consent obtained.   Follow up plan: Telephone appointment with CCM team member scheduled for: 10/10/2018  [place]  [email or web address]   ??[phone]

## 2018-09-21 ENCOUNTER — Other Ambulatory Visit: Payer: Self-pay | Admitting: Physician Assistant

## 2018-09-21 DIAGNOSIS — I10 Essential (primary) hypertension: Secondary | ICD-10-CM

## 2018-10-01 DIAGNOSIS — M0579 Rheumatoid arthritis with rheumatoid factor of multiple sites without organ or systems involvement: Secondary | ICD-10-CM | POA: Diagnosis not present

## 2018-10-10 ENCOUNTER — Ambulatory Visit: Payer: Medicare Other

## 2018-10-10 ENCOUNTER — Other Ambulatory Visit: Payer: Self-pay

## 2018-10-10 NOTE — Chronic Care Management (AMB) (Signed)
  Chronic Care Management   Note  10/10/2018 Name: Glenda Williams MRN: [id number] DOB: 22-Nov-1940  Care Coordination: Successful telephone encounter to Ms. Kristiane T. Rybacki, 78 year old female patient of Dr. Lavon Paganini who was referred to Chronic Care Management by her health plan. She was consented to services and an initial telephone assessment with RN CM was scheduled for today at 11:00.   Unfortunately patient states she did not remember and is currently at another appointment. She wishes to reschedule.  Plan: Initial telephone assessment rescheduled for 10/18/2018 at 2:00  Cari Burgo E. Rollene Rotunda, RN, BSN Nurse Care Coordinator [facility] Care Management [phone]

## 2018-10-18 ENCOUNTER — Other Ambulatory Visit: Payer: Self-pay

## 2018-10-18 ENCOUNTER — Ambulatory Visit (INDEPENDENT_AMBULATORY_CARE_PROVIDER_SITE_OTHER): Payer: Medicare Other

## 2018-10-18 DIAGNOSIS — R739 Hyperglycemia, unspecified: Secondary | ICD-10-CM

## 2018-10-18 DIAGNOSIS — E7800 Pure hypercholesterolemia, unspecified: Secondary | ICD-10-CM

## 2018-10-18 DIAGNOSIS — M069 Rheumatoid arthritis, unspecified: Secondary | ICD-10-CM

## 2018-10-18 DIAGNOSIS — I4811 Longstanding persistent atrial fibrillation: Secondary | ICD-10-CM | POA: Diagnosis not present

## 2018-10-18 DIAGNOSIS — I10 Essential (primary) hypertension: Secondary | ICD-10-CM | POA: Diagnosis not present

## 2018-10-18 NOTE — Chronic Care Management (AMB) (Signed)
Chronic Care Management   Initial Visit Note  10/19/2018 Name: Glenda Williams MRN: [id number] DOB: 03-12-1940  Subjective: "I just want to be as healthy as I can be"  Objective:  Lab Results  Component Value Date   HGBA1C 5.8 (H) 03/27/2018   HGBA1C 6.0 06/13/2016   HGBA1C 5.8 11/23/2015   Lab Results  Component Value Date   MICROALBUR 20 10/16/2014   LDLCALC 106 (H) 03/27/2018   CREATININE 0.73 03/27/2018     BP Readings from Last 3 Encounters:  03/27/18 134/75  01/24/18 116/70  01/19/18 122/70   Lab Results  Component Value Date   CHOL 162 03/27/2018   HDL 39 (L) 03/27/2018   LDLCALC 106 (H) 03/27/2018   TRIG 84 03/27/2018   CHOLHDL 4.2 03/27/2018    Assessment: Glenda Williams is a 78 year old female patient of Dr. Lavon Paganini, who was referred to Chronic Care Management by her health plan. She was consented to services and completed her initial assessment and goal setting appointment with RN CM today. She has a history of but not limited to RA, HTN, Afib, Benign essential tremor, prediabetes, and hypercholesteremia.  Review of patient status, including review of consultants reports, relevant laboratory and other test results, and collaboration with appropriate care team members and the patient's provider was performed as part of comprehensive patient evaluation and provision of chronic care management services.     Advanced Directives 10/18/2018  Does Patient Have a Medical Advance Directive? Yes  Type of Advance Directive Living will;Healthcare Power of Attorney  Does patient want to make changes to medical advance directive? No - Patient declined  Copy of [place] in Chart? No - copy requested     Goals Addressed            This Visit's Progress   . I want to remain as healthy as I can (pt-stated)       Glenda Williams is doing well maintaining her health. She takes all medications as prescribed and follows through with all medical  appointments. She limits her fried foods and tries to be as active as she can. She has a lot of back and leg pain from arthritis and walks with a cane to reduce fall risk secondary to RA. She admits to a lack of stamina. We discussed "use it or loss it" as it relates to exercise and activity and the effects of sedentary lifestyle after a period of exercise. She checks her BP but not daily. Reading 2 days ago 130/66, HR 62. She also admits that her eye exam is overdue but will call for appointment ASAP. She is up to date on her dental cleaning.   Current Barriers:  Marland Kitchen Knowledge Deficits related to maintaining a healthy lifestyle to decrease risk of developing additional chronic disease states  Nurse Case Manager Clinical Goal(s):  Marland Kitchen Over the next 60 days, patient will demonstrate understanding of plan for health management/maintenance as evidenced by taking all medications as prescribed, checking BP and/or CBG as directed and record, daily exercise as tolerated, adherence to prescribe diet/healthy eating plan, and adherence to all medical appointments.  Interventions:  . Provided education to patient re: maintaining a healthy lifestyle . Reviewed medications with patient and discussed importance of adherence . Discussed plans with patient for ongoing care management follow up and provided patient with direct contact information for care management team . Advised patient, providing education and rationale, to monitor blood pressure daily and record, calling  Dr. Brita Romp for findings outside established parameters.  . Provided patient with written educational materials related to hypertension management and prediabetes diet . Discussed importance of yearly vaccine for flu and insuring pneumonia is up to date  Patient Self Care Activities:  . Self administers medications as prescribed . Attends all scheduled provider appointments . Calls pharmacy for medication refills . Attends church or other  social activities . Performs ADL's independently . Performs IADL's independently . Calls provider office for new concerns or questions  Initial goal documentation         Follow up plan:  Telephone follow up appointment with care management team member scheduled for: 2 weeks    Mattelyn Imhoff E. Rollene Rotunda, RN, BSN Nurse Care Coordinator [facility] [phone]

## 2018-10-19 NOTE — Patient Instructions (Addendum)
Thank you allowing the Chronic Care Management Team to be a part of your care! It was a pleasure speaking with you today!  1. Continue to take all medications as prescribed 2. Please try to exercise 3-5 days a week. Remember to start slow and do not increase your walking distance/time daily. You need to increase it weekly. This will help you increase your stamina and strength. 3. Please call for an eye appointment 4. Please make sure your pneumonia is up to date and you receive your flu vaccine when available. 5. I have included some information on healthy lifestyle, diet, and prediabetes. Please call me with any question you may have.  CCM (Chronic Care Management) Team   Trish Fountain RN, BSN Nurse Care Coordinator  [phone]  Ruben Reason PharmD  Clinical Pharmacist  [phone]   Elliot Gurney, LCSW Clinical Social Worker [phone]  Goals Addressed            This Visit's Progress   . I want to remain as healthy as I can (pt-stated)       Current Barriers:  Marland Kitchen Knowledge Deficits related to maintaining a healthy lifestyle to decrease risk of developing additional chronic disease states  Nurse Case Manager Clinical Goal(s):  Marland Kitchen Over the next 60 days, patient will demonstrate understanding of plan for health management/maintenance as evidenced by taking all medications as prescribed, checking BP and/or CBG as directed and record, daily exercise as tolerated, adherence to prescribe diet/healthy eating plan, and adherence to all medical appointments.  Interventions:  . Provided education to patient re: maintaining a healthy lifestyle . Reviewed medications with patient and discussed importance of adherence . Discussed plans with patient for ongoing care management follow up and provided patient with direct contact information for care management team . Advised patient, providing education and rationale, to monitor blood pressure daily and record, calling Dr.  Brita Romp for findings outside established parameters.  . Provided patient with written educational materials related to hypertension management and prediabetes diet . Discussed importance of yearly physical and encouraged patient to make appointment for physical (after later review of EMR patient has scheduled appointment for 11/07/2018)   Patient Self Care Activities:  . Self administers medications as prescribed . Attends all scheduled provider appointments . Calls pharmacy for medication refills . Attends church or other social activities . Performs ADL's independently . Performs IADL's independently . Calls provider office for new concerns or questions  Initial goal documentation        Print copy of patient instructions provided.   Telephone follow up appointment with care management team member scheduled for: 2 weeks  SYMPTOMS OF A STROKE   You have any symptoms of stroke. "BE FAST" is an easy way to remember the main warning signs: ? B - Balance. Signs are dizziness, sudden trouble walking, or loss of balance. ? E - Eyes. Signs are trouble seeing or a sudden change in how you see. ? F - Face. Signs are sudden weakness or loss of feeling of the face, or the face or eyelid drooping on one side. ? A - Arms. Signs are weakness or loss of feeling in an arm. This happens suddenly and usually on one side of the body. ? S - Speech. Signs are sudden trouble speaking, slurred speech, or trouble understanding what people say. ? T - Time. Time to call emergency services. Write down what time symptoms started.  You have other signs of stroke, such as: ? A sudden,  very bad headache with no known cause. ? Feeling sick to your stomach (nausea). ? Throwing up (vomiting). ? Jerky movements you cannot control (seizure).  SYMPTOMS OF A HEART ATTACK  What are the signs or symptoms? Symptoms of this condition include:  Chest pain. It may feel like: ? Crushing or  squeezing. ? Tightness, pressure, fullness, or heaviness.  Pain in the arm, neck, jaw, back, or upper body.  Shortness of breath.  Heartburn.  Indigestion.  Nausea.  Cold sweats.  Feeling tired.  Sudden lightheadedness.   Prediabetes Eating Plan Prediabetes is a condition that causes blood sugar (glucose) levels to be higher than normal. This increases the risk for developing diabetes. In order to prevent diabetes from developing, your health care provider may recommend a diet and other lifestyle changes to help you:  Control your blood glucose levels.  Improve your cholesterol levels.  Manage your blood pressure. Your health care provider may recommend working with a diet and nutrition specialist (dietitian) to make a meal plan that is best for you. What are tips for following this plan? Lifestyle  Set weight loss goals with the help of your health care team. It is recommended that most people with prediabetes lose 7% of their current body weight.  Exercise for at least 30 minutes at least 5 days a week.  Attend a support group or seek ongoing support from a mental health counselor.  Take over-the-counter and prescription medicines only as told by your health care provider. Reading food labels  Read food labels to check the amount of fat, salt (sodium), and sugar in prepackaged foods. Avoid foods that have: ? Saturated fats. ? Trans fats. ? Added sugars.  Avoid foods that have more than 300 milligrams (mg) of sodium per serving. Limit your daily sodium intake to less than 2,300 mg each day. Shopping  Avoid buying pre-made and processed foods. Cooking  Cook with olive oil. Do not use butter, lard, or ghee.  Bake, broil, grill, or boil foods. Avoid frying. Meal planning   Work with your dietitian to develop an eating plan that is right for you. This may include: ? Tracking how many calories you take in. Use a food diary, notebook, or mobile application to  track what you eat at each meal. ? Using the glycemic index (GI) to plan your meals. The index tells you how quickly a food will raise your blood glucose. Choose low-GI foods. These foods take a longer time to raise blood glucose.  Consider following a Mediterranean diet. This diet includes: ? Several servings each day of fresh fruits and vegetables. ? Eating fish at least twice a week. ? Several servings each day of whole grains, beans, nuts, and seeds. ? Using olive oil instead of other fats. ? Moderate alcohol consumption. ? Eating small amounts of red meat and whole-fat dairy.  If you have high blood pressure, you may need to limit your sodium intake or follow a diet such as the DASH eating plan. DASH is an eating plan that aims to lower high blood pressure.  What foods are recommended? The items listed below may not be a complete list. Talk with your dietitian about what dietary choices are best for you. Grains Whole grains, such as whole-wheat or whole-grain breads, crackers, cereals, and pasta. Unsweetened oatmeal. Bulgur. Barley. Quinoa. Brown rice. Corn or whole-wheat flour tortillas or taco shells. Vegetables Lettuce. Spinach. Peas. Beets. Cauliflower. Cabbage. Broccoli. Carrots. Tomatoes. Squash. Eggplant. Herbs. Peppers. Onions. Cucumbers. Brussels sprouts. Fruits  Berries. Bananas. Apples. Oranges. Grapes. Papaya. Mango. Pomegranate. Kiwi. Grapefruit. Cherries. Meats and other protein foods Seafood. Poultry without skin. Lean cuts of pork and beef. Tofu. Eggs. Nuts. Beans. Dairy Low-fat or fat-free dairy products, such as yogurt, cottage cheese, and cheese. Beverages Water. Tea. Coffee. Sugar-free or diet soda. Seltzer water. Lowfat or no-fat milk. Milk alternatives, such as soy or almond milk. Fats and oils Olive oil. Canola oil. Sunflower oil. Grapeseed oil. Avocado. Walnuts. Sweets and desserts Sugar-free or low-fat pudding. Sugar-free or low-fat ice cream and other  frozen treats. Seasoning and other foods Herbs. Sodium-free spices. Mustard. Relish. Low-fat, low-sugar ketchup. Low-fat, low-sugar barbecue sauce. Low-fat or fat-free mayonnaise.   What foods are not recommended? The items listed below may not be a complete list. Talk with your dietitian about what dietary choices are best for you. Grains Refined white flour and flour products, such as bread, pasta, snack foods, and cereals. Vegetables Canned vegetables. Frozen vegetables with butter or cream sauce. Fruits Fruits canned with syrup. Meats and other protein foods Fatty cuts of meat. Poultry with skin. Breaded or fried meat. Processed meats. Dairy Full-fat yogurt, cheese, or milk. Beverages Sweetened drinks, such as sweet iced tea and soda. Fats and oils Butter. Lard. Ghee. Sweets and desserts Baked goods, such as cake, cupcakes, pastries, cookies, and cheesecake. Seasoning and other foods Spice mixes with added salt. Ketchup. Barbecue sauce. Mayonnaise.    How to Increase Your Level of Physical Activity  Getting regular physical activity is important for your overall health and well-being. Most people do not get enough exercise. There are easy ways to increase your level of physical activity, even if you have not been very active in the past or you are just starting out. Why is physical activity important? Physical activity has many short-term and long-term health benefits. Regular exercise can:  Help you lose weight or maintain a healthy weight.  Strengthen your muscles and bones.  Boost your mood and improve self-esteem.  Reduce your risk of certain long-term (chronic) diseases, like heart disease, cancer, and diabetes.  Help you stay capable of walking and moving around (mobile) as you age.  Prevent accidents, such as falls, as you age.  Increase life expectancy. What are the benefits of being physically active on a regular basis? In addition to improving your  physical health, being physically active on most days of the week can help you in ways that you may not expect. Benefits of regular physical activity may include:  Feeling good about your body.  Being able to move around more easily and for longer periods of time without getting tired (increased stamina).  Finding new sources of fun and enjoyment.  Meeting new people who share a common interest.  Being able to fight off illness better (enhanced immunity).  Being able to sleep better. What can happen if I am not physically active on a regular basis? Not getting enough physical activity can lead to an unhealthy lifestyle and future health problems. This can increase your chances of:  Becoming overweight or obese.  Becoming sick.  Developing chronic illnesses, like heart disease or diabetes.  Having mental health problems, like depression or anxiety.  Having sleep problems.  Having trouble walking or getting yourself around (reduced mobility).  Injuring yourself in a fall as you get older. What steps can I take to be more physically active?   Check with your health care provider about how to get started. Ask your health care provider what activities are  safe for you.  Start out slowly. Walking or doing some simple chair exercises is a good place to start, especially if you have not been active before or for a long time.  Try to find activities that you enjoy. You are more likely to commit to an exercise routine if it does not feel like a chore.  If you have bone or joint problems, choose low-impact exercises, like walking or swimming.  Include physical activity in your everyday routine.  Invite friends or family members to exercise with you. This also will help you commit to your workout plan.  Set goals that you can work toward.  Aim for at least 150 minutes of moderate-intensity exercise each week. Examples of moderate-intensity exercise include walking or riding a  bike. Where to find more information  Centers for Disease Control and Prevention: BowlingGrip.is  President's Council on Fitness, Sports & Nutrition www.http://villegas.org/  ChooseMyPlate: WirelessMortgages.dk Contact a health care provider if:  You have headaches, muscle aches, or joint pain.  You feel dizzy or light-headed while exercising.  You faint.  You have chest pain while exercising. Summary  Exercise benefits your mind and body at any age, even if you are just starting out.  If you have a chronic illness or have not been active for a while, check with your health care provider before increasing your physical activity.  Choose activities that are safe and enjoyable for you. Ask your health care provider what activities are safe for you.  Start slowly. Tell your health care provider if you have problems as you start to increase your activity level. This information is not intended to replace advice given to you by your health care provider. Make sure you discuss any questions you have with your health care provider. Document Released: 02/10/2016 Document Revised: 06/17/2018 Document Reviewed: 02/10/2016 Elsevier Patient Education  2020 [place]: How to Protect Yourself and Others Know how it spreads  There is currently no vaccine to prevent coronavirus disease 2019 (COVID-19).  The best way to prevent illness is to avoid being exposed to this virus.  The virus is thought to spread mainly from person-to-person. ? Between people who are in close contact with one another (within about 6 feet). ? Through respiratory droplets produced when an infected person coughs, sneezes or talks. ? These droplets can land in the mouths or noses of people who are nearby or possibly be inhaled into the lungs. ? Some recent studies have suggested that COVID-19 may be spread by people who are not showing  symptoms. Everyone should Clean your hands often  Wash your hands often with soap and water for at least 20 seconds especially after you have been in a public place, or after blowing your nose, coughing, or sneezing.  If soap and water are not readily available, use a hand sanitizer that contains at least 60% alcohol. Cover all surfaces of your hands and rub them together until they feel dry.  Avoid touching your eyes, nose, and mouth with unwashed hands. Avoid close contact  Stay home if you are sick.  Avoid close contact with people who are sick.  Put distance between yourself and other people. ? Remember that some people without symptoms may be able to spread virus. ? This is especially important for people who are at higher risk of getting very GainPain.com.cy Cover your mouth and nose with a cloth face cover when around others  You could spread COVID-19 to others even if you  do not feel sick.  Everyone should wear a cloth face cover when they have to go out in public, for example to the grocery store or to pick up other necessities. ? Cloth face coverings should not be placed on young children under age 61, anyone who has trouble breathing, or is unconscious, incapacitated or otherwise unable to remove the mask without assistance.  The cloth face cover is meant to protect other people in case you are infected.  Do NOT use a facemask meant for a Dietitian.  Continue to keep about 6 feet between yourself and others. The cloth face cover is not a substitute for social distancing. Cover coughs and sneezes  If you are in a private setting and do not have on your cloth face covering, remember to always cover your mouth and nose with a tissue when you cough or sneeze or use the inside of your elbow.  Throw used tissues in the trash.  Immediately wash your hands with soap and water for at least 20  seconds. If soap and water are not readily available, clean your hands with a hand sanitizer that contains at least 60% alcohol. Clean and disinfect  Clean AND disinfect frequently touched surfaces daily. This includes tables, doorknobs, light switches, countertops, handles, desks, phones, keyboards, toilets, faucets, and sinks. RackRewards.fr  If surfaces are dirty, clean them: Use detergent or soap and water prior to disinfection.  Then, use a household disinfectant. You can see a list of EPA-registered household disinfectants here. michellinders.com 07/09/2018 This information is not intended to replace advice given to you by your health care provider. Make sure you discuss any questions you have with your health care provider. Document Released: 06/18/2018 Document Revised: 07/17/2018 Document Reviewed: 06/18/2018 Elsevier Patient Education  [place]. Summary  To prevent diabetes from developing, you may need to make diet and other lifestyle changes to help control blood sugar, improve cholesterol levels, and manage your blood pressure.  Set weight loss goals with the help of your health care team. It is recommended that most people with prediabetes lose 7 percent of their current body weight.  Consider following a Mediterranean diet that includes plenty of fresh fruits and vegetables, whole grains, beans, nuts, seeds, fish, lean meat, low-fat dairy, and healthy oils. This information is not intended to replace advice given to you by your health care provider. Make sure you discuss any questions you have with your health care provider. Document Released: 07/07/2014 Document Revised: 06/14/2018 Document Reviewed: 04/26/2016 Elsevier Patient Education  2020 [employer].

## 2018-10-21 ENCOUNTER — Telehealth: Payer: Self-pay | Admitting: Family Medicine

## 2018-10-21 NOTE — Telephone Encounter (Signed)
Please advise samples? 

## 2018-10-21 NOTE — Telephone Encounter (Signed)
OK to give 1 week worth of Xarelto samples.

## 2018-10-21 NOTE — Telephone Encounter (Signed)
Pt would like samples of Xarelto 20 mg  She ordered from [facility] and they will not get here until the 24th,  CB# [phone]  teri

## 2018-10-21 NOTE — Telephone Encounter (Signed)
Samples are at front desk ready to pick up. No answer and no vm.

## 2018-10-22 NOTE — Telephone Encounter (Signed)
NA

## 2018-10-22 NOTE — Telephone Encounter (Signed)
Samples was picked up today 10/22/2018.

## 2018-10-30 ENCOUNTER — Other Ambulatory Visit: Payer: Self-pay

## 2018-10-30 ENCOUNTER — Ambulatory Visit: Payer: Medicare Other

## 2018-10-30 DIAGNOSIS — I10 Essential (primary) hypertension: Secondary | ICD-10-CM

## 2018-10-30 DIAGNOSIS — R7303 Prediabetes: Secondary | ICD-10-CM

## 2018-10-30 NOTE — Patient Instructions (Signed)
Thank you allowing the Chronic Care Management Team to be a part of your care! It was a pleasure speaking with you today!  1. Continue to take all mediations as prescribed 2. Check with Dr. Jefm Bryant to see if PT would be appropriate for you 3. Continue to walk as tolerated. This will help build your stamina and strength 4. Continue to check your BP and blood sugars as discussed 5. Don't forget to ask about flu and pneumonia shot at your next appointment.  CCM (Chronic Care Management) Team   Trish Fountain RN, BSN Nurse Care Coordinator  [phone]  Ruben Reason PharmD  Clinical Pharmacist  [phone]   Elliot Gurney, LCSW Clinical Social Worker [phone]  Goals Addressed            This Visit's Progress   . I want to remain as healthy as I can (pt-stated)       Current Barriers:  Marland Kitchen Knowledge Deficits related to maintaining a healthy lifestyle to decrease risk of developing additional chronic disease states  Nurse Case Manager Clinical Goal(s):  Marland Kitchen Over the next 60 days, patient will demonstrate understanding of plan for health management/maintenance as evidenced by taking all medications as prescribed, checking BP and/or CBG as directed and record, daily exercise as tolerated, adherence to prescribe diet/healthy eating plan, and adherence to all medical appointments.  Interventions:  . Assessed for medication adherence . Assessed for increased activity and exercise . Discussed covid 19 infection prevention precautions . Discussed plans with patient for ongoing care management follow up and provided patient with direct contact information for care management team . Reviewed BP and CBG log  .  Sent previous Scientist, clinical (histocompatibility and immunogenetics) via mail as patient states she did not receive previously sent information. . Encouraged patient to request flu vaccine and pneumonia vaccine if needed during PCP visit 11/07/2018   Patient Self Care Activities:  . Self administers  medications as prescribed . Attends all scheduled provider appointments . Calls pharmacy for medication refills . Attends church or other social activities . Performs ADL's independently . Performs IADL's independently . Calls provider office for new concerns or questions  Please see past updates related to this goal by clicking on the "Past Updates" button in the selected goal         The patient verbalized understanding of instructions provided today and declined a print copy of patient instruction materials.   The care management team will reach out to the patient again over the next 60 days.  The patient has been provided with contact information for the care management team and has been advised to call with any health related questions or concerns.   SYMPTOMS OF A STROKE   You have any symptoms of stroke. "BE FAST" is an easy way to remember the main warning signs: ? B - Balance. Signs are dizziness, sudden trouble walking, or loss of balance. ? E - Eyes. Signs are trouble seeing or a sudden change in how you see. ? F - Face. Signs are sudden weakness or loss of feeling of the face, or the face or eyelid drooping on one side. ? A - Arms. Signs are weakness or loss of feeling in an arm. This happens suddenly and usually on one side of the body. ? S - Speech. Signs are sudden trouble speaking, slurred speech, or trouble understanding what people say. ? T - Time. Time to call emergency services. Write down what time symptoms started.  You have other  signs of stroke, such as: ? A sudden, very bad headache with no known cause. ? Feeling sick to your stomach (nausea). ? Throwing up (vomiting). ? Jerky movements you cannot control (seizure).  SYMPTOMS OF A HEART ATTACK  What are the signs or symptoms? Symptoms of this condition include:  Chest pain. It may feel like: ? Crushing or squeezing. ? Tightness, pressure, fullness, or heaviness.  Pain in the arm, neck, jaw, back, or  upper body.  Shortness of breath.  Heartburn.  Indigestion.  Nausea.  Cold sweats.  Feeling tired.  Sudden lightheadedness.

## 2018-10-30 NOTE — Chronic Care Management (AMB) (Signed)
Chronic Care Management   Follow Up Note   10/30/2018 Name: Glenda Williams MRN: [id number] DOB: 11-20-1940  Referred by: Virginia Crews, MD Reason for referral : Chronic Care Management (follow up)   Glenda Williams is a 78 y.o. year old female who is a primary care patient of Bacigalupo, Dionne Bucy, MD. The CCM team was consulted for assistance with chronic disease management and care coordination needs.    Review of patient status, including review of consultants reports, relevant laboratory and other test results, and collaboration with appropriate care team members and the patient's provider was performed as part of comprehensive patient evaluation and provision of chronic care management services.    SDOH (Social Determinants of Health) screening performed today: Physical Activity. See Care Plan for related entries.   Outpatient Encounter Medications as of 10/30/2018  Medication Sig Note  . cholecalciferol (VITAMIN D) 400 units TABS tablet Take 400 Units by mouth daily.   Marland Kitchen EPINEPHrine 0.3 mg/0.3 mL IJ SOAJ injection Inject 0.3 mLs into the skin once as needed for anaphylaxis.   . folic acid (FOLVITE) 1 MG tablet Take 1 mg by mouth daily.   . hydrochlorothiazide (HYDRODIURIL) 12.5 MG tablet TAKE 1 TABLET BY MOUTH  DAILY   . hydrocortisone cream 1 % Apply 1 application topically as needed for itching.   . InFLIXimab (REMICADE IV) Inject into the vein. Every 6-8 weeks   . levothyroxine (SYNTHROID) 100 MCG tablet TAKE 1 TABLET BY MOUTH  DAILY   . methotrexate (RHEUMATREX) 2.5 MG tablet Take 20 mg by mouth every Thursday.    . mupirocin ointment (BACTROBAN) 2 % Apply 1 application topically 2 (two) times daily.   . Omega-3 Fatty Acids (FISH OIL) 1000 MG CAPS Take 1 capsule by mouth daily.   Marland Kitchen omeprazole (PRILOSEC) 20 MG capsule TAKE 1 CAPSULE BY MOUTH 2  TIMES DAILY BEFORE A MEAL.   Marland Kitchen potassium chloride SA (K-DUR,KLOR-CON) 20 MEQ tablet Take 1 tablet by mouth  daily   . predniSONE  (DELTASONE) 5 MG tablet  10/18/2018: Available if needed for RA flare  . propranolol (INDERAL) 40 MG tablet 1 tablets in the am, 0.5 tablet in pm (Patient taking differently: Take 40 mg by mouth. Take 2 tabs (80mg  total) in AM, Take 1 tab (40mg  total) in PM)   . Red Yeast Rice 600 MG CAPS Take 1 capsule by mouth daily.   . rivaroxaban (XARELTO) 20 MG TABS tablet TAKE 1 TABLET BY MOUTH ONCE DAILY   . sertraline (ZOLOFT) 50 MG tablet TAKE 3 TABLETS BY MOUTH  DAILY    No facility-administered encounter medications on file as of 10/30/2018.      Goals Addressed            This Visit's Progress   . I want to remain as healthy as I can (pt-stated)       Since last encounter with CCM RN CM, patient has increased her activity. She is walking more although it causes her leg, back and knee discomfort. She is considering contacting Dr. Jefm Bryant for a referral to PT. She continues to do stretches for her back daily. Her highest BP is 132/64 and her CBGs are <100. She if very appreciative of the verbal education provided. Unfortunately she never received the written education. Will resend today via mail.   Current Barriers:  Marland Kitchen Knowledge Deficits related to maintaining a healthy lifestyle to decrease risk of developing additional chronic disease states  Nurse Case Manager Clinical  Goal(s):  Marland Kitchen Over the next 60 days, patient will demonstrate understanding of plan for health management/maintenance as evidenced by taking all medications as prescribed, checking BP and/or CBG as directed and record, daily exercise as tolerated, adherence to prescribe diet/healthy eating plan, and adherence to all medical appointments.  Interventions:  . Assessed for medication adherence . Assessed for increased activity and exercise . Discussed covid 19 infection prevention precautions . Discussed plans with patient for ongoing care management follow up and provided patient with direct contact information for care management  team . Reviewed BP and CBG log  .  Sent previous Scientist, clinical (histocompatibility and immunogenetics) via mail as patient states she did not receive previously sent information. . Encouraged patient to request flu vaccine and pneumonia vaccine if needed during PCP visit 11/07/2018   Patient Self Care Activities:  . Self administers medications as prescribed . Attends all scheduled provider appointments . Calls pharmacy for medication refills . Attends church or other social activities . Performs ADL's independently . Performs IADL's independently . Calls provider office for new concerns or questions  Please see past updates related to this goal by clicking on the "Past Updates" button in the selected goal          The care management team will reach out to the patient again over the next 60 days.  The patient has been provided with contact information for the care management team and has been advised to call with any health related questions or concerns.    Dejia Ebron E. Rollene Rotunda, RN, BSN Nurse Care Coordinator [facility] [phone]

## 2018-11-07 ENCOUNTER — Other Ambulatory Visit: Payer: Self-pay

## 2018-11-07 ENCOUNTER — Ambulatory Visit (INDEPENDENT_AMBULATORY_CARE_PROVIDER_SITE_OTHER): Payer: Medicare Other | Admitting: Family Medicine

## 2018-11-07 ENCOUNTER — Encounter: Payer: Self-pay | Admitting: Family Medicine

## 2018-11-07 VITALS — BP 138/69 | HR 64 | Temp 96.8°F | Ht 65.0 in | Wt 216.2 lb

## 2018-11-07 DIAGNOSIS — R3989 Other symptoms and signs involving the genitourinary system: Secondary | ICD-10-CM | POA: Diagnosis not present

## 2018-11-07 DIAGNOSIS — I10 Essential (primary) hypertension: Secondary | ICD-10-CM

## 2018-11-07 DIAGNOSIS — R7303 Prediabetes: Secondary | ICD-10-CM | POA: Diagnosis not present

## 2018-11-07 DIAGNOSIS — F419 Anxiety disorder, unspecified: Secondary | ICD-10-CM

## 2018-11-07 DIAGNOSIS — N3090 Cystitis, unspecified without hematuria: Secondary | ICD-10-CM | POA: Diagnosis not present

## 2018-11-07 DIAGNOSIS — M069 Rheumatoid arthritis, unspecified: Secondary | ICD-10-CM

## 2018-11-07 DIAGNOSIS — E039 Hypothyroidism, unspecified: Secondary | ICD-10-CM

## 2018-11-07 LAB — POCT URINALYSIS DIPSTICK
Bilirubin, UA: NEGATIVE
Glucose, UA: NEGATIVE
Ketones, UA: NEGATIVE
Nitrite, UA: NEGATIVE
Protein, UA: POSITIVE — AB
Spec Grav, UA: 1.02 (ref 1.010–1.025)
Urobilinogen, UA: 0.2 U/dL
pH, UA: 5 (ref 5.0–8.0)

## 2018-11-07 MED ORDER — CEPHALEXIN 500 MG PO CAPS: 500.0000 mg | ORAL_CAPSULE | Freq: Two times a day (BID) | ORAL | 0 refills | Status: AC

## 2018-11-07 NOTE — Assessment & Plan Note (Signed)
Likely 2/2 prediabetes Recheck A1c Discussed low carb diet

## 2018-11-07 NOTE — Assessment & Plan Note (Signed)
Discussed importance of healthy weight management Discussed diet and exercise  

## 2018-11-07 NOTE — Patient Instructions (Signed)
 The CDC recommends two doses of Shingrix (the shingles vaccine) separated by 2 to 6 months for adults age 77 years and older. I recommend checking with your insurance plan regarding coverage for this vaccine.     Preventive Care 65 Years and Older, Female Preventive care refers to lifestyle choices and visits with your health care provider that can promote health and wellness. This includes:  A yearly physical exam. This is also called an annual well check.  Regular dental and eye exams.  Immunizations.  Screening for certain conditions.  Healthy lifestyle choices, such as diet and exercise. What can I expect for my preventive care visit? Physical exam Your health care provider will check:  Height and weight. These may be used to calculate body mass index (BMI), which is a measurement that tells if you are at a healthy weight.  Heart rate and blood pressure.  Your skin for abnormal spots. Counseling Your health care provider may ask you questions about:  Alcohol, tobacco, and drug use.  Emotional well-being.  Home and relationship well-being.  Sexual activity.  Eating habits.  History of falls.  Memory and ability to understand (cognition).  Work and work environment.  Pregnancy and menstrual history. What immunizations do I need?  Influenza (flu) vaccine  This is recommended every year. Tetanus, diphtheria, and pertussis (Tdap) vaccine  You may need a Td booster every 10 years. Varicella (chickenpox) vaccine  You may need this vaccine if you have not already been vaccinated. Zoster (shingles) vaccine  You may need this after age 60. Pneumococcal conjugate (PCV13) vaccine  One dose is recommended after age 65. Pneumococcal polysaccharide (PPSV23) vaccine  One dose is recommended after age 65. Measles, mumps, and rubella (MMR) vaccine  You may need at least one dose of MMR if you were born in 1957 or later. You may also need a second  dose. Meningococcal conjugate (MenACWY) vaccine  You may need this if you have certain conditions. Hepatitis A vaccine  You may need this if you have certain conditions or if you travel or work in places where you may be exposed to hepatitis A. Hepatitis B vaccine  You may need this if you have certain conditions or if you travel or work in places where you may be exposed to hepatitis B. Haemophilus influenzae type b (Hib) vaccine  You may need this if you have certain conditions. You may receive vaccines as individual doses or as more than one vaccine together in one shot (combination vaccines). Talk with your health care provider about the risks and benefits of combination vaccines. What tests do I need? Blood tests  Lipid and cholesterol levels. These may be checked every 5 years, or more frequently depending on your overall health.  Hepatitis C test.  Hepatitis B test. Screening  Lung cancer screening. You may have this screening every year starting at age 55 if you have a 30-pack-year history of smoking and currently smoke or have quit within the past 15 years.  Colorectal cancer screening. All adults should have this screening starting at age 77 and continuing until age 75. Your health care provider may recommend screening at age 45 if you are at increased risk. You will have tests every 1-10 years, depending on your results and the type of screening test.  Diabetes screening. This is done by checking your blood sugar (glucose) after you have not eaten for a while (fasting). You may have this done every 1-3 years.  Mammogram. This may   be done every 1-2 years. Talk with your health care provider about how often you should have regular mammograms.  BRCA-related cancer screening. This may be done if you have a family history of breast, ovarian, tubal, or peritoneal cancers. Other tests  Sexually transmitted disease (STD) testing.  Bone density scan. This is done to screen for  osteoporosis. You may have this done starting at age 65. Follow these instructions at home: Eating and drinking  Eat a diet that includes fresh fruits and vegetables, whole grains, lean protein, and low-fat dairy products. Limit your intake of foods with high amounts of sugar, saturated fats, and salt.  Take vitamin and mineral supplements as recommended by your health care provider.  Do not drink alcohol if your health care provider tells you not to drink.  If you drink alcohol: ? Limit how much you have to 0-1 drink a day. ? Be aware of how much alcohol is in your drink. In the [place] one drink equals one 12 oz bottle of beer (355 mL), one 5 oz glass of wine (148 mL), or one 1 oz glass of hard liquor (44 mL). Lifestyle  Take daily care of your teeth and gums.  Stay active. Exercise for at least 30 minutes on 5 or more days each week.  Do not use any products that contain nicotine or tobacco, such as cigarettes, e-cigarettes, and chewing tobacco. If you need help quitting, ask your health care provider.  If you are sexually active, practice safe sex. Use a condom or other form of protection in order to prevent STIs (sexually transmitted infections).  Talk with your health care provider about taking a low-dose aspirin or statin. What's next?  Go to your health care provider once a year for a well check visit.  Ask your health care provider how often you should have your eyes and teeth checked.  Stay up to date on all vaccines. This information is not intended to replace advice given to you by your health care provider. Make sure you discuss any questions you have with your health care provider. Document Released: 03/19/2015 Document Revised: 02/14/2018 Document Reviewed: 02/14/2018 Elsevier Patient Education  2020 [employer]  

## 2018-11-07 NOTE — Assessment & Plan Note (Signed)
Previously well controlled Continue Synthroid at current dose  Recheck TSH and adjust Synthroid as indicated   

## 2018-11-07 NOTE — Assessment & Plan Note (Signed)
Well controlled Continue Zoloft 150mg  daily

## 2018-11-07 NOTE — Assessment & Plan Note (Signed)
Followed by Rheum Will check with Rheum about getting Shingrix - discussed this is not a live vaccine

## 2018-11-07 NOTE — Assessment & Plan Note (Signed)
Well controlled Continue current medications Recheck metabolic panel F/u in 6 months  

## 2018-11-07 NOTE — Progress Notes (Signed)
Patient: Glenda Williams Female    DOB: 05/18/1940   78 y.o.   MRN: [id number] Visit Date: 11/07/2018  Today's Provider: Lavon Paganini, MD   Chief Complaint  Patient presents with  . Dysuria  . Hypothyroidism  . Hypertension   Subjective:    I, Porsha McClurkin CMA, am acting as a scribe for Lavon Paganini, MD.    AWE was w/ [facility] on 07/08/2018. Dysuria  The current episode started more than 1 month ago. The problem occurs intermittently. The problem has been unchanged. The quality of the pain is described as burning. There has been no fever. Associated symptoms include nausea. Pertinent negatives include no hesitancy or urgency. She has tried increased fluids for the symptoms. The treatment provided mild relief.  Tennis Ship states she has been drinking cranberry juice and increased water intake.  Hypothyroid, follow-up:  TSH  Date Value Ref Range Status  03/27/2018 2.540 0.450 - 4.500 uIU/mL Final  11/04/2017 6.104 (H) 0.350 - 4.500 uIU/mL Final    Comment:    Performed by a 3rd Generation assay with a functional sensitivity of <=0.01 uIU/mL. Performed at [facility], [address] [place], [place] [address]   06/13/2016 3.500 0.450 - 4.500 uIU/mL Final   Wt Readings from Last 3 Encounters:  11/07/18 216 lb 3.2 oz (98.1 kg)  03/27/18 219 lb (99.3 kg)  01/24/18 225 lb (102.1 kg)    She was last seen for hypothyroid 6 months ago.  Management since that visit includes none. She reports excellent compliance with treatment. She is not having side effects.  She is not exercising. She is experiencing none She denies change in energy level, diarrhea, nervousness and palpitations Weight trend: stable  ------------------------------------------------------------------------   Hypertension, follow-up:  BP Readings from Last 3 Encounters:  11/07/18 138/69  03/27/18 134/75  01/24/18 116/70    She was last seen for hypertension 6 months ago.  BP at  that visit was 134/75. Management changes since that visit include none. She reports good compliance with treatment. She is not having side effects.  She is not exercising. She is adherent to low salt diet.   Outside blood pressures are not checked. She is experiencing none.  Patient denies chest pain, chest pressure/discomfort, exertional chest pressure/discomfort, fatigue and irregular heart beat.   Cardiovascular risk factors include advanced age (older than 30 for men, 13 for women) and hypertension.  Use of agents associated with hypertension: none.     Weight trend: stable Wt Readings from Last 3 Encounters:  11/07/18 216 lb 3.2 oz (98.1 kg)  03/27/18 219 lb (99.3 kg)  01/24/18 225 lb (102.1 kg)    Current diet: well balanced  ------------------------------------------------------------------------     Allergies  Allergen Reactions  . Bactrim [Sulfamethoxazole-Trimethoprim] Rash  . Macrobid [Nitrofurantoin Macrocrystal] Rash  . Mirabegron Rash  . Oxybutynin Rash  . Penicillins Rash    Has patient had a PCN reaction causing immediate rash, facial/tongue/throat swelling, SOB or lightheadedness with hypotension: Yes Has patient had a PCN reaction causing severe rash involving mucus membranes or skin necrosis: No Has patient had a PCN reaction that required hospitalization: No Has patient had a PCN reaction occurring within the last 10 years: Unknown If all of the above answers are "NO", then may proceed with Cephalosporin use.      Current Outpatient Medications:  .  cholecalciferol (VITAMIN D) 400 units TABS tablet, Take 400 Units by mouth daily., Disp: , Rfl:  .  EPINEPHrine 0.3 mg/0.3 mL IJ SOAJ injection, Inject 0.3 mLs into the skin once as needed for anaphylaxis., Disp: , Rfl: 1 .  folic acid (FOLVITE) 1 MG tablet, Take 1 mg by mouth daily., Disp: , Rfl:  .  hydrochlorothiazide (HYDRODIURIL) 12.5 MG tablet, TAKE 1 TABLET BY MOUTH  DAILY, Disp: 90 tablet, Rfl: 1  .  hydrocortisone cream 1 %, Apply 1 application topically as needed for itching., Disp: , Rfl:  .  InFLIXimab (REMICADE IV), Inject into the vein. Every 6-8 weeks, Disp: , Rfl:  .  levothyroxine (SYNTHROID) 100 MCG tablet, TAKE 1 TABLET BY MOUTH  DAILY, Disp: 90 tablet, Rfl: 2 .  methotrexate (RHEUMATREX) 2.5 MG tablet, Take 20 mg by mouth every Thursday. , Disp: , Rfl:  .  mupirocin ointment (BACTROBAN) 2 %, Apply 1 application topically 2 (two) times daily., Disp: 22 g, Rfl: 0 .  Omega-3 Fatty Acids (FISH OIL) 1000 MG CAPS, Take 1 capsule by mouth daily., Disp: , Rfl:  .  omeprazole (PRILOSEC) 20 MG capsule, TAKE 1 CAPSULE BY MOUTH 2  TIMES DAILY BEFORE A MEAL., Disp: 180 capsule, Rfl: 1 .  potassium chloride SA (K-DUR,KLOR-CON) 20 MEQ tablet, Take 1 tablet by mouth  daily, Disp: 90 tablet, Rfl: 1 .  predniSONE (DELTASONE) 5 MG tablet, , Disp: , Rfl:  .  propranolol (INDERAL) 40 MG tablet, 1 tablets in the am, 0.5 tablet in pm (Patient taking differently: Take 40 mg by mouth. Take 2 tabs (80mg  total) in AM, Take 1 tab (40mg  total) in PM), Disp: 60 tablet, Rfl: 0 .  Red Yeast Rice 600 MG CAPS, Take 1 capsule by mouth daily., Disp: , Rfl:  .  rivaroxaban (XARELTO) 20 MG TABS tablet, TAKE 1 TABLET BY MOUTH ONCE DAILY, Disp: , Rfl:  .  sertraline (ZOLOFT) 50 MG tablet, TAKE 3 TABLETS BY MOUTH  DAILY, Disp: 270 tablet, Rfl: 3 .  cephALEXin (KEFLEX) 500 MG capsule, Take 1 capsule (500 mg total) by mouth 2 (two) times daily for 7 days., Disp: 14 capsule, Rfl: 0  Review of Systems  Constitutional: Negative.   Respiratory: Negative.   Gastrointestinal: Positive for nausea.  Genitourinary: Positive for dysuria. Negative for hesitancy and urgency.  Musculoskeletal: Positive for back pain.  Neurological: Negative.     Social History   Tobacco Use  . Smoking status: Never Smoker  . Smokeless tobacco: Never Used  Substance Use Topics  . Alcohol use: No      Objective:   BP 138/69 (BP  Location: Left Arm, Patient Position: Sitting, Cuff Size: Normal)   Pulse 64   Temp (!) 96.8 F (36 C) (Temporal)   Ht 5\' 5"  (1.651 m)   Wt 216 lb 3.2 oz (98.1 kg)   SpO2 99%   BMI 35.98 kg/m  Vitals:   11/07/18 0855  BP: 138/69  Pulse: 64  Temp: (!) 96.8 F (36 C)  TempSrc: Temporal  SpO2: 99%  Weight: 216 lb 3.2 oz (98.1 kg)  Height: 5\' 5"  (1.651 m)  Body mass index is 35.98 kg/m.   Physical Exam Vitals signs reviewed.  Constitutional:      General: She is not in acute distress.    Appearance: Normal appearance. She is well-developed. She is not diaphoretic.  HENT:     Head: Normocephalic and atraumatic.  Eyes:     General: No scleral icterus.    Conjunctiva/sclera: Conjunctivae normal.  Neck:     Musculoskeletal: Neck supple.  Thyroid: No thyromegaly.  Cardiovascular:     Rate and Rhythm: Normal rate and regular rhythm.     Pulses: Normal pulses.     Heart sounds: Normal heart sounds. No murmur.  Pulmonary:     Effort: Pulmonary effort is normal. No respiratory distress.     Breath sounds: Normal breath sounds. No wheezing, rhonchi or rales.  Abdominal:     General: There is no distension.     Palpations: Abdomen is soft.     Tenderness: There is no abdominal tenderness. There is no right CVA tenderness, left CVA tenderness, guarding or rebound.  Musculoskeletal:     Right lower leg: No edema.     Left lower leg: No edema.  Lymphadenopathy:     Cervical: No cervical adenopathy.  Skin:    General: Skin is warm and dry.     Capillary Refill: Capillary refill takes less than 2 seconds.     Findings: No rash.  Neurological:     Mental Status: She is alert and oriented to person, place, and time. Mental status is at baseline.  Psychiatric:        Mood and Affect: Mood normal.        Behavior: Behavior normal.      Results for orders placed or performed in visit on 11/07/18  POCT urinalysis dipstick  Result Value Ref Range   Color, UA Golden Yellow     Clarity, UA Cloudy    Glucose, UA Negative Negative   Bilirubin, UA Negative    Ketones, UA Negative    Spec Grav, UA 1.020 1.010 - 1.025   Blood, UA Small+    pH, UA 5.0 5.0 - 8.0   Protein, UA Positive (A) Negative   Urobilinogen, UA 0.2 0.2 or 1.0 E.U./dL   Nitrite, UA Negative    Leukocytes, UA Moderate (2+) (A) Negative   Appearance     Odor         Assessment & Plan   Problem List Items Addressed This Visit      Cardiovascular and Mediastinum   Hypertension    Well controlled Continue current medications Recheck metabolic panel F/u in 6 months       Relevant Orders   Basic Metabolic Panel (BMET)     Endocrine   Adult hypothyroidism    Previously well controlled Continue Synthroid at current dose  Recheck TSH and adjust Synthroid as indicated       Relevant Orders   TSH     Musculoskeletal and Integument   Rheumatoid arthritis ([place])    Followed by Rheum Will check with Rheum about getting Shingrix - discussed this is not a live vaccine        Other   Anxiety    Well controlled Continue Zoloft 150mg  daily      Prediabetes    Likely 2/2 prediabetes Recheck A1c Discussed low carb diet      Relevant Orders   Hemoglobin A1c   Morbid obesity ([place])    Discussed importance of healthy weight management Discussed diet and exercise        Other Visit Diagnoses    Cystitis    -  Primary   Relevant Orders   POCT urinalysis dipstick (Completed)   Urine Culture   Urine Microscopic    - Symptoms and UA consistent with UTI -No systemic symptoms or signs of pyelonephritis - Given hematuria, will send urine micro to confirm and will plan to recheck urine in about 6  weeks after completion of antibiotics to ensure hematuria has cleared -Will start treatment with 7day course of Keflex after reviewing previous urine culture -We will send urine culture to confirm sensitivities -Discussed return precautions     Return in about 6 months (around  05/07/2019) for chronic disease f/u, AWV.   The entirety of the information documented in the History of Present Illness, Review of Systems and Physical Exam were personally obtained by me. Portions of this information were initially documented by [facility], CMA and reviewed by me for thoroughness and accuracy.    Laurin Morgenstern, Dionne Bucy, MD MPH [facility]

## 2018-11-08 ENCOUNTER — Telehealth: Payer: Self-pay

## 2018-11-08 LAB — URINALYSIS, MICROSCOPIC ONLY
Casts: NONE SEEN /lpf
WBC, UA: >30 /hpf — AB (ref 0–5)

## 2018-11-08 LAB — BASIC METABOLIC PANEL
BUN/Creatinine Ratio: 19 (ref 12–28)
BUN: 15 mg/dL (ref 8–27)
CO2: 24 mmol/L (ref 20–29)
Calcium: 9.3 mg/dL (ref 8.7–10.3)
Chloride: 104 mmol/L (ref 96–106)
Creatinine, Ser: 0.78 mg/dL (ref 0.57–1.00)
GFR calc Af Amer: 85 mL/min/{1.73_m2} (ref >59)
GFR calc non Af Amer: 74 mL/min/{1.73_m2} (ref >59)
Glucose: 86 mg/dL (ref 65–99)
Potassium: 3.7 mmol/L (ref 3.5–5.2)
Sodium: 143 mmol/L (ref 134–144)

## 2018-11-08 LAB — HEMOGLOBIN A1C
Est. average glucose Bld gHb Est-mCnc: 114 mg/dL
Hgb A1c MFr Bld: 5.6 % (ref 4.8–5.6)

## 2018-11-08 LAB — TSH: TSH: 2.64 u[IU]/mL (ref 0.450–4.500)

## 2018-11-08 NOTE — Telephone Encounter (Signed)
-----   Message from Virginia Crews, MD sent at 11/08/2018  8:13 AM EDT ----- Normal labs

## 2018-11-08 NOTE — Telephone Encounter (Signed)
Patient notified of lab results

## 2018-11-08 NOTE — Telephone Encounter (Signed)
Tried calling patient no answer/vm setup so will try pt later.

## 2018-11-10 LAB — URINE CULTURE

## 2018-11-25 DIAGNOSIS — G252 Other specified forms of tremor: Secondary | ICD-10-CM | POA: Diagnosis not present

## 2018-11-25 DIAGNOSIS — M79641 Pain in right hand: Secondary | ICD-10-CM | POA: Diagnosis not present

## 2018-11-25 DIAGNOSIS — M0579 Rheumatoid arthritis with rheumatoid factor of multiple sites without organ or systems involvement: Secondary | ICD-10-CM | POA: Diagnosis not present

## 2018-11-25 DIAGNOSIS — I878 Other specified disorders of veins: Secondary | ICD-10-CM | POA: Diagnosis not present

## 2018-11-25 DIAGNOSIS — M79642 Pain in left hand: Secondary | ICD-10-CM | POA: Diagnosis not present

## 2018-11-25 DIAGNOSIS — Z79899 Other long term (current) drug therapy: Secondary | ICD-10-CM | POA: Diagnosis not present

## 2018-11-27 DIAGNOSIS — R531 Weakness: Secondary | ICD-10-CM | POA: Diagnosis not present

## 2018-11-27 DIAGNOSIS — G250 Essential tremor: Secondary | ICD-10-CM | POA: Diagnosis not present

## 2018-11-27 DIAGNOSIS — R2689 Other abnormalities of gait and mobility: Secondary | ICD-10-CM | POA: Diagnosis not present

## 2018-11-29 ENCOUNTER — Other Ambulatory Visit: Payer: Self-pay | Admitting: Physician Assistant

## 2018-11-29 DIAGNOSIS — F419 Anxiety disorder, unspecified: Secondary | ICD-10-CM

## 2018-12-06 ENCOUNTER — Other Ambulatory Visit: Payer: Self-pay | Admitting: Family Medicine

## 2018-12-10 ENCOUNTER — Other Ambulatory Visit: Payer: Self-pay

## 2018-12-10 ENCOUNTER — Ambulatory Visit (INDEPENDENT_AMBULATORY_CARE_PROVIDER_SITE_OTHER): Payer: Medicare Other

## 2018-12-10 DIAGNOSIS — Z23 Encounter for immunization: Secondary | ICD-10-CM

## 2019-01-06 DIAGNOSIS — I878 Other specified disorders of veins: Secondary | ICD-10-CM | POA: Diagnosis not present

## 2019-01-06 DIAGNOSIS — M0579 Rheumatoid arthritis with rheumatoid factor of multiple sites without organ or systems involvement: Secondary | ICD-10-CM | POA: Diagnosis not present

## 2019-01-06 DIAGNOSIS — M79641 Pain in right hand: Secondary | ICD-10-CM | POA: Diagnosis not present

## 2019-01-06 DIAGNOSIS — M79642 Pain in left hand: Secondary | ICD-10-CM | POA: Diagnosis not present

## 2019-02-06 ENCOUNTER — Encounter: Payer: Self-pay | Admitting: Family Medicine

## 2019-02-06 NOTE — Progress Notes (Signed)
Patient: Glenda Williams Female    DOB: 1940/07/12   78 y.o.   MRN: [id number] Visit Date: 02/07/2019  Today's Provider: Lavon Paganini, MD   Chief Complaint  Patient presents with  . Urinary Tract Infection   Subjective:    Virtual Visit via Video Note  I connected with [name] on 02/07/19 at  3:40 PM EST by a video enabled telemedicine application and verified that I am speaking with the correct person using two identifiers.  Location: Patient location: home Provider location: [facility] Persons involved in the visit: patient, provider   I discussed the limitations of evaluation and management by telemedicine and the availability of in person appointments. The patient expressed understanding and agreed to proceed.    HPI Urinary Tract Infection: Patient complains of dysuria She has had symptoms for 3 weeks. Patient also complains of no. Patient denies fever, stomach ache and vaginal discharge. Patient does have a history of recurrent UTI.  Patient does not have a history of pyelonephritis.  Feels like she has a waxing and waning course.  Blood sugar this AM was 100.  Allergies  Allergen Reactions  . Bactrim [Sulfamethoxazole-Trimethoprim] Rash  . Macrobid [Nitrofurantoin Macrocrystal] Rash  . Mirabegron Rash  . Oxybutynin Rash  . Penicillins Rash    Has patient had a PCN reaction causing immediate rash, facial/tongue/throat swelling, SOB or lightheadedness with hypotension: Yes Has patient had a PCN reaction causing severe rash involving mucus membranes or skin necrosis: No Has patient had a PCN reaction that required hospitalization: No Has patient had a PCN reaction occurring within the last 10 years: Unknown If all of the above answers are "NO", then may proceed with Cephalosporin use.      Current Outpatient Medications:  .  cholecalciferol (VITAMIN D) 400 units TABS tablet, Take 400 Units by mouth daily., Disp: , Rfl:  .   EPINEPHrine 0.3 mg/0.3 mL IJ SOAJ injection, Inject 0.3 mLs into the skin once as needed for anaphylaxis., Disp: , Rfl: 1 .  folic acid (FOLVITE) 1 MG tablet, Take 1 mg by mouth daily., Disp: , Rfl:  .  hydrochlorothiazide (HYDRODIURIL) 12.5 MG tablet, TAKE 1 TABLET BY MOUTH  DAILY, Disp: 90 tablet, Rfl: 1 .  hydrocortisone cream 1 %, Apply 1 application topically as needed for itching., Disp: , Rfl:  .  InFLIXimab (REMICADE IV), Inject into the vein. Every 6-8 weeks, Disp: , Rfl:  .  levothyroxine (SYNTHROID) 100 MCG tablet, TAKE 1 TABLET BY MOUTH  DAILY, Disp: 90 tablet, Rfl: 2 .  methotrexate (RHEUMATREX) 2.5 MG tablet, Take 20 mg by mouth every Thursday. , Disp: , Rfl:  .  mupirocin ointment (BACTROBAN) 2 %, Apply 1 application topically 2 (two) times daily., Disp: 22 g, Rfl: 0 .  Omega-3 Fatty Acids (FISH OIL) 1000 MG CAPS, Take 1 capsule by mouth daily., Disp: , Rfl:  .  omeprazole (PRILOSEC) 20 MG capsule, TAKE 1 CAPSULE BY MOUTH 2  TIMES DAILY BEFORE MEALS, Disp: 180 capsule, Rfl: 3 .  potassium chloride SA (K-DUR,KLOR-CON) 20 MEQ tablet, Take 1 tablet by mouth  daily, Disp: 90 tablet, Rfl: 1 .  propranolol (INDERAL) 40 MG tablet, 1 tablets in the am, 0.5 tablet in pm (Patient taking differently: Take 40 mg by mouth. Take 2 tabs (80mg  total) in AM, Take 1 tab (40mg  total) in PM), Disp: 60 tablet, Rfl: 0 .  Red Yeast Rice 600 MG CAPS, Take 1 capsule by  mouth daily., Disp: , Rfl:  .  rivaroxaban (XARELTO) 20 MG TABS tablet, TAKE 1 TABLET BY MOUTH ONCE DAILY, Disp: , Rfl:  .  sertraline (ZOLOFT) 50 MG tablet, TAKE 3 TABLETS BY MOUTH  DAILY, Disp: 270 tablet, Rfl: 3 .  cephALEXin (KEFLEX) 500 MG capsule, Take 1 capsule (500 mg total) by mouth 2 (two) times daily for 7 days., Disp: 14 capsule, Rfl: 0  Review of Systems  Constitutional: Negative.   Cardiovascular: Negative.   Gastrointestinal: Positive for abdominal pain (suprapubic). Negative for constipation, diarrhea, nausea and vomiting.   Genitourinary: Positive for dysuria, frequency and urgency. Negative for decreased urine volume, difficulty urinating, flank pain and hematuria.  Neurological: Negative.   Psychiatric/Behavioral: Negative.     Social History   Tobacco Use  . Smoking status: Never Smoker  . Smokeless tobacco: Never Used  Substance Use Topics  . Alcohol use: No      Objective:   BP 139/68   Pulse (!) 58  Vitals:   02/07/19 1447  BP: 139/68  Pulse: (!) 58  There is no height or weight on file to calculate BMI.   Physical Exam Constitutional:      General: She is not in acute distress.    Appearance: Normal appearance. She is not diaphoretic.  HENT:     Head: Normocephalic and atraumatic.  Pulmonary:     Effort: Pulmonary effort is normal. No respiratory distress.  Neurological:     Mental Status: She is alert and oriented to person, place, and time. Mental status is at baseline.  Psychiatric:        Mood and Affect: Mood normal.        Behavior: Behavior normal.      Results for orders placed or performed in visit on 02/07/19  POCT urinalysis dipstick  Result Value Ref Range   Color, UA yellow    Clarity, UA cloudy    Glucose, UA Negative Negative   Bilirubin, UA Negative    Ketones, UA Negative    Spec Grav, UA 1.020 1.010 - 1.025   Blood, UA Large    pH, UA 6.0 5.0 - 8.0   Protein, UA Negative Negative   Urobilinogen, UA 0.2 0.2 or 1.0 E.U./dL   Nitrite, UA Negative    Leukocytes, UA Moderate (2+) (A) Negative       Assessment & Plan   1. Cystitis - Symptoms and UA consistent with UTI -No systemic symptoms or signs of pyelonephritis -Will start treatment with 7day course of Keflex after reviewing previous urine culture and allergies -We will send urine culture to confirm sensitivities -Discussed return precautions  - Urine Culture   Meds ordered this encounter  Medications  . cephALEXin (KEFLEX) 500 MG capsule    Sig: Take 1 capsule (500 mg total) by mouth 2  (two) times daily for 7 days.    Dispense:  14 capsule    Refill:  0     Return if symptoms worsen or fail to improve.   The entirety of the information documented in the History of Present Illness, Review of Systems and Physical Exam were personally obtained by me. Portions of this information were initially documented by Lynford Humphrey, CMA and reviewed by me for thoroughness and accuracy.    Dellis Voght, Dionne Bucy, MD MPH [facility]

## 2019-02-07 ENCOUNTER — Encounter: Payer: Self-pay | Admitting: Family Medicine

## 2019-02-07 ENCOUNTER — Ambulatory Visit (INDEPENDENT_AMBULATORY_CARE_PROVIDER_SITE_OTHER): Payer: Medicare Other | Admitting: Family Medicine

## 2019-02-07 VITALS — BP 139/68 | HR 58

## 2019-02-07 DIAGNOSIS — N3090 Cystitis, unspecified without hematuria: Secondary | ICD-10-CM | POA: Diagnosis not present

## 2019-02-07 DIAGNOSIS — R300 Dysuria: Secondary | ICD-10-CM | POA: Diagnosis not present

## 2019-02-07 LAB — POCT URINALYSIS DIPSTICK
Bilirubin, UA: NEGATIVE
Glucose, UA: NEGATIVE
Ketones, UA: NEGATIVE
Nitrite, UA: NEGATIVE
Protein, UA: NEGATIVE
Spec Grav, UA: 1.02 (ref 1.010–1.025)
Urobilinogen, UA: 0.2 E.U./dL
pH, UA: 6 (ref 5.0–8.0)

## 2019-02-07 MED ORDER — CEPHALEXIN 500 MG PO CAPS: 500.0000 mg | ORAL_CAPSULE | Freq: Two times a day (BID) | ORAL | 0 refills | Status: DC

## 2019-02-07 NOTE — Patient Instructions (Signed)
Urinary Tract Infection, Adult A urinary tract infection (UTI) is an infection of any part of the urinary tract. The urinary tract includes:  The kidneys.  The ureters.  The bladder.  The urethra. These organs make, store, and get rid of pee (urine) in the body. What are the causes? This is caused by germs (bacteria) in your genital area. These germs grow and cause swelling (inflammation) of your urinary tract. What increases the risk? You are more likely to develop this condition if:  You have a small, thin tube (catheter) to drain pee.  You cannot control when you pee or poop (incontinence).  You are female, and: ? You use these methods to prevent pregnancy: ? A medicine that kills sperm (spermicide). ? A device that blocks sperm (diaphragm). ? You have low levels of a female hormone (estrogen). ? You are pregnant.  You have genes that add to your risk.  You are sexually active.  You take antibiotic medicines.  You have trouble peeing because of: ? A prostate that is bigger than normal, if you are female. ? A blockage in the part of your body that drains pee from the bladder (urethra). ? A kidney stone. ? A nerve condition that affects your bladder (neurogenic bladder). ? Not getting enough to drink. ? Not peeing often enough.  You have other conditions, such as: ? Diabetes. ? A weak disease-fighting system (immune system). ? Sickle cell disease. ? Gout. ? Injury of the spine. What are the signs or symptoms? Symptoms of this condition include:  Needing to pee right away (urgently).  Peeing often.  Peeing small amounts often.  Pain or burning when peeing.  Blood in the pee.  Pee that smells bad or not like normal.  Trouble peeing.  Pee that is cloudy.  Fluid coming from the vagina, if you are female.  Pain in the belly or lower back. Other symptoms include:  Throwing up (vomiting).  No urge to eat.  Feeling mixed up (confused).  Being tired  and grouchy (irritable).  A fever.  Watery poop (diarrhea). How is this treated? This condition may be treated with:  Antibiotic medicine.  Other medicines.  Drinking enough water. Follow these instructions at home:  Medicines  Take over-the-counter and prescription medicines only as told by your doctor.  If you were prescribed an antibiotic medicine, take it as told by your doctor. Do not stop taking it even if you start to feel better. General instructions  Make sure you: ? Pee until your bladder is empty. ? Do not hold pee for a long time. ? Empty your bladder after sex. ? Wipe from front to back after pooping if you are a female. Use each tissue one time when you wipe.  Drink enough fluid to keep your pee pale yellow.  Keep all follow-up visits as told by your doctor. This is important. Contact a doctor if:  You do not get better after 1-2 days.  Your symptoms go away and then come back. Get help right away if:  You have very bad back pain.  You have very bad pain in your lower belly.  You have a fever.  You are sick to your stomach (nauseous).  You are throwing up. Summary  A urinary tract infection (UTI) is an infection of any part of the urinary tract.  This condition is caused by germs in your genital area.  There are many risk factors for a UTI. These include having a small, thin   tube to drain pee and not being able to control when you pee or poop.  Treatment includes antibiotic medicines for germs.  Drink enough fluid to keep your pee pale yellow. This information is not intended to replace advice given to you by your health care provider. Make sure you discuss any questions you have with your health care provider. Document Released: 08/09/2007 Document Revised: 02/07/2018 Document Reviewed: 08/30/2017 Elsevier Patient Education  2020 [employer]  

## 2019-02-12 LAB — URINE CULTURE

## 2019-02-13 ENCOUNTER — Telehealth: Payer: Self-pay

## 2019-02-13 NOTE — Telephone Encounter (Signed)
-----   Message from Virginia Crews, MD sent at 02/13/2019  8:22 AM EST ----- Urine culture confirms UTI, but it is intermediate susceptibility to abx prescribed.  If she is feeling better, we can continue the Keflex, otherwise, given her allergies and the resistance, she would need IV/IM ceftriaxone.

## 2019-02-13 NOTE — Telephone Encounter (Signed)
Patient advised as below. Patient reports that she did start to have symptoms again last night. Patient reports she will come in for ceftriaxone.

## 2019-02-14 ENCOUNTER — Ambulatory Visit (INDEPENDENT_AMBULATORY_CARE_PROVIDER_SITE_OTHER): Payer: Medicare Other | Admitting: Family Medicine

## 2019-02-14 ENCOUNTER — Other Ambulatory Visit: Payer: Self-pay

## 2019-02-14 ENCOUNTER — Encounter: Payer: Self-pay | Admitting: Family Medicine

## 2019-02-14 VITALS — BP 130/85 | HR 65 | Temp 97.6°F | Resp 16 | Wt 216.0 lb

## 2019-02-14 DIAGNOSIS — N3000 Acute cystitis without hematuria: Secondary | ICD-10-CM

## 2019-02-14 MED ORDER — CEFTRIAXONE SODIUM 500 MG IJ SOLR
500.0000 mg | Freq: Once | INTRAMUSCULAR | Status: AC
  Administered 2019-02-14: 500 mg via INTRAMUSCULAR

## 2019-02-14 MED ORDER — CEFPODOXIME PROXETIL 100 MG PO TABS: 100.0000 mg | ORAL_TABLET | Freq: Two times a day (BID) | ORAL | 0 refills | Status: AC

## 2019-02-14 MED ORDER — LIDOCAINE HCL (PF) 1 % IJ SOLN
2.0000 mL | Freq: Once | INTRAMUSCULAR | Status: AC
  Administered 2019-02-14: 2 mL via INTRADERMAL

## 2019-02-14 NOTE — Progress Notes (Signed)
Patient: Glenda Williams Female    DOB: 1940-05-01   78 y.o.   MRN: [id number] Visit Date: 02/14/2019  Today's Provider: Lavon Paganini, MD   Chief Complaint  Patient presents with  . Follow-up   Subjective:    I, Sulibeya S. Dimas, CMA, am acting as a scribe for Lavon Paganini, MD.  HPI Patient here today to follow up on UTI. Patient's culture is positive for UTI. Antibiotic is intermediate susceptibility. Patient advised to come for IM ceftriaxone. Patient reports she is still taking Keflex, today is the last day. Patient reports that her symptoms stopped for 3 days and today symptoms have returned. Patient denies any fever.  Allergies  Allergen Reactions  . Bactrim [Sulfamethoxazole-Trimethoprim] Rash  . Macrobid [Nitrofurantoin Macrocrystal] Rash  . Mirabegron Rash  . Oxybutynin Rash  . Penicillins Rash    Has patient had a PCN reaction causing immediate rash, facial/tongue/throat swelling, SOB or lightheadedness with hypotension: Yes Has patient had a PCN reaction causing severe rash involving mucus membranes or skin necrosis: No Has patient had a PCN reaction that required hospitalization: No Has patient had a PCN reaction occurring within the last 10 years: Unknown If all of the above answers are "NO", then may proceed with Cephalosporin use.      Current Outpatient Medications:  .  cholecalciferol (VITAMIN D) 400 units TABS tablet, Take 400 Units by mouth daily., Disp: , Rfl:  .  EPINEPHrine 0.3 mg/0.3 mL IJ SOAJ injection, Inject 0.3 mLs into the skin once as needed for anaphylaxis., Disp: , Rfl: 1 .  folic acid (FOLVITE) 1 MG tablet, Take 1 mg by mouth daily., Disp: , Rfl:  .  hydrochlorothiazide (HYDRODIURIL) 12.5 MG tablet, TAKE 1 TABLET BY MOUTH  DAILY, Disp: 90 tablet, Rfl: 1 .  hydrocortisone cream 1 %, Apply 1 application topically as needed for itching., Disp: , Rfl:  .  InFLIXimab (REMICADE IV), Inject into the vein. Every 6-8 weeks, Disp: , Rfl:  .   levothyroxine (SYNTHROID) 100 MCG tablet, TAKE 1 TABLET BY MOUTH  DAILY, Disp: 90 tablet, Rfl: 2 .  methotrexate (RHEUMATREX) 2.5 MG tablet, Take 20 mg by mouth every Thursday. , Disp: , Rfl:  .  Omega-3 Fatty Acids (FISH OIL) 1000 MG CAPS, Take 1 capsule by mouth daily., Disp: , Rfl:  .  omeprazole (PRILOSEC) 20 MG capsule, TAKE 1 CAPSULE BY MOUTH 2  TIMES DAILY BEFORE MEALS, Disp: 180 capsule, Rfl: 3 .  potassium chloride SA (K-DUR,KLOR-CON) 20 MEQ tablet, Take 1 tablet by mouth  daily, Disp: 90 tablet, Rfl: 1 .  propranolol ER (INDERAL LA) 60 MG 24 hr capsule, Take by mouth., Disp: , Rfl:  .  Red Yeast Rice 600 MG CAPS, Take 1 capsule by mouth daily., Disp: , Rfl:  .  rivaroxaban (XARELTO) 20 MG TABS tablet, TAKE 1 TABLET BY MOUTH ONCE DAILY, Disp: , Rfl:  .  sertraline (ZOLOFT) 50 MG tablet, TAKE 3 TABLETS BY MOUTH  DAILY, Disp: 270 tablet, Rfl: 3 .  cefpodoxime (VANTIN) 100 MG tablet, Take 1 tablet (100 mg total) by mouth 2 (two) times daily for 5 days., Disp: 10 tablet, Rfl: 0  Review of Systems  Constitutional: Negative.   HENT: Negative.   Respiratory: Negative.   Cardiovascular: Negative.   Gastrointestinal: Positive for abdominal pain. Negative for abdominal distention, constipation, diarrhea, nausea and vomiting.  Genitourinary: Positive for dysuria, frequency and urgency. Negative for decreased urine volume, flank pain and hematuria.  Social History   Tobacco Use  . Smoking status: Never Smoker  . Smokeless tobacco: Never Used  Substance Use Topics  . Alcohol use: No      Objective:   BP 130/85 (BP Location: Left Arm, Patient Position: Sitting, Cuff Size: Normal)   Pulse 65   Temp 97.6 F (36.4 C) (Temporal)   Resp 16   Wt 216 lb (98 kg)   BMI 35.94 kg/m  Vitals:   02/14/19 1413  BP: 130/85  Pulse: 65  Resp: 16  Temp: 97.6 F (36.4 C)  TempSrc: Temporal  Weight: 216 lb (98 kg)  Body mass index is 35.94 kg/m.   Physical Exam Vitals reviewed.   Constitutional:      General: She is not in acute distress.    Appearance: Normal appearance. She is not diaphoretic.  HENT:     Head: Normocephalic and atraumatic.  Eyes:     General: No scleral icterus.    Conjunctiva/sclera: Conjunctivae normal.  Cardiovascular:     Rate and Rhythm: Rhythm irregularly irregular.     Pulses: Normal pulses.     Heart sounds: Normal heart sounds.  Pulmonary:     Effort: Pulmonary effort is normal. No respiratory distress.     Breath sounds: Normal breath sounds. No wheezing.  Abdominal:     General: Bowel sounds are normal. There is no distension.     Palpations: Abdomen is soft.     Tenderness: There is no abdominal tenderness. There is no right CVA tenderness or left CVA tenderness.  Musculoskeletal:     Right lower leg: No edema.     Left lower leg: No edema.  Skin:    General: Skin is warm and dry.     Findings: No rash.  Neurological:     Mental Status: She is alert and oriented to person, place, and time. Mental status is at baseline.  Psychiatric:        Mood and Affect: Mood normal.        Behavior: Behavior normal.     No results found for any visits on 02/14/19.     Assessment & Plan    1. Acute cystitis without hematuria - UCx shows E coli UTI resistant to multiple abx - considering her allergies and resistance, will need to treat with 3rd gen cephalosporin - will give IM Ceftriaxone x today and start 5 day course of Cefpodoxime tomorrow -No systemic symptoms or signs of pyelonephritis -Discussed return precautions    Meds ordered this encounter  Medications  . cefpodoxime (VANTIN) 100 MG tablet    Sig: Take 1 tablet (100 mg total) by mouth 2 (two) times daily for 5 days.    Dispense:  10 tablet    Refill:  0  . lidocaine (PF) (XYLOCAINE) 1 % injection 2 mL  . cefTRIAXone (ROCEPHIN) injection 500 mg  . cefTRIAXone (ROCEPHIN) injection 500 mg     Return if symptoms worsen or fail to improve.   The entirety of  the information documented in the History of Present Illness, Review of Systems and Physical Exam were personally obtained by me. Portions of this information were initially documented by Lynford Humphrey , CMA and reviewed by me for thoroughness and accuracy.    Jakaree Pickard, Dionne Bucy, MD MPH [facility]

## 2019-02-14 NOTE — Patient Instructions (Signed)
Antibiotic Resistance Antibiotics are medicines used to treat infections that are caused by bacteria. Antibiotic resistance means that the medicine no longer works against the bacteria. Resistance can develop if you use antibiotics the wrong way. When antibiotics are given in response to illnesses caused by viruses, like colds or the flu, many normal bacteria in the body are killed. Some bacteria that are not killed may develop resistance to the antibiotic. These bacteria may grow and cause infections that are resistant to some other antibiotics. If this happens, the bacteria can continue to grow and cause infection. What are the causes? Antibiotic resistance happens when bacteria come into contact with an antibiotic over and over again. Over time, the bacteria become resistant to the antibiotic. What increases the risk? This condition is more likely to develop in people who:  Are repeatedly given antibiotics to treat viral infections.  Do not take their antibiotic medicine as prescribed, such as not finishing all of the medicine.  Need to take antibiotics often because of a long-term medical condition.  Take medicines that weaken their body's defense system (immune system).  Have surgery.  Are elderly.  Need a procedure that replaces some of the work that healthy kidneys do (dialysis).  Have an organ transplant.  Are being treated for cancer.  Have a type of infection that is more likely to be caused by resistant bacteria. These include certain: ? Skin infections. ? STIs (sexually transmitted infections). ? Respiratory infections. ? Infections of the lining of the brain and spinal cord (meningitis). ? Gastrointestinal infections.  Eat foods from animals that were treated with antibiotics. Antibiotic-resistant bacteria can be passed through the food.  Live with or care for someone with an antibiotic-resistant infection.  Are hospitalized for a long timeor live in a long-term  care facility. What are the signs or symptoms? The main symptom of this condition is having an infection that does not improve with normal treatment. The specific signs and symptoms that you have will depend on the type of infection, but they may include:  A fever.  Warmth, redness, and tenderness around a wound or incision.  Brown, yellow, or green drainage from a wound or incision.  A bad smell coming from a wound or incision.  Nausea, vomiting, and abdominal pain. How is this diagnosed? This condition may be diagnosed by:  Your medical history. Your health care provider may suspect antibiotic resistance if your condition does not improve after you have been treated for an infection.  You may also have other tests, including: ? Analysis of a fluid or stool sample. This is done to identify bacteria under a microscope and determine what type of antibiotic will work against them (culture and sensitivity). ? Other blood tests and imaging tests. These are done to check if your infection has spread or has become more serious. How is this treated? Treatment for this condition depends on the nature of the specific infection.  Treatment may include oral antibiotics that kill more types of bacteria (broad spectrum).  Serious antibiotic-resistant infections may need to be treated in the hospital. In severe cases, this may include: ? Surgery to remove infected or damaged tissue. ? Antibiotics or other medicines given through an IV tube. Follow these instructions at home: Taking antibiotics correctly   Understand when antibiotics are needed and when they are not needed.  Do not ask for an antibiotic prescription if you have been diagnosed with a viral illness. Antibiotic medicine will not make your illness go away  faster. Common viral illnesses include an ear infection, a sinus infection, the stomach flu, or bronchitis.  Do not take antibiotics that are left over from a previous  prescription.  Do not take antibiotics that were prescribed for someone else.  If you are prescribed an antibiotic: ? Take it exactly as told by your health care provider. Do not stop taking the medicine even if you start to feel better. ? If you have been taking it for more than 10 days, ask your health care provider or pharmacist if you should keep taking it.  Do not save unused antibiotics to use at a later date. Get rid of unused medicine as told by your health care provider or pharmacist. Preventing infection   If you have an infection, avoid close contact with people around you.  Avoid using personal items that are used by other people, such as towels, razors, or bedding.  Regularly disinfect doorknobs, food preparation surfaces, and bathrooms with bleach-containing products or solutions.  Wash your hands with soap and water: ? After using the bathroom. ? Before and after caring for a wound or incision. ? Before and after preparing food. General instructions   Stay up-to-date on vaccinations. Many vaccines prevent illnesses that are treated with antibiotics. Contact a health care provider if:  You have a fever or chills.  You are taking a new antibiotic and you are not getting better after a few days.  You have three or more periods of diarrhea after starting a new antibiotic.  You have increased warmth, redness, or tenderness around a wound.  You have brown, yellow, or green drainage from a wound or incision.  You have a bad-smelling wound or incision.  You have new or worsening nausea, vomiting, or abdominal pain. Get help right away if:  You develop a rash.  Your mouth or tongue itches.  You have a tight feeling in your throat.  You have trouble breathing.  You have chest pain or tightness.  You are dizzy or you faint. This information is not intended to replace advice given to you by your health care provider. Make sure you discuss any questions you  have with your health care provider. Document Released: 05/13/2002 Document Revised: 03/07/2017 Document Reviewed: 07/26/2015 Elsevier Patient Education  2020 [employer].

## 2019-03-13 ENCOUNTER — Telehealth: Payer: Self-pay | Admitting: Family Medicine

## 2019-03-13 NOTE — Telephone Encounter (Signed)
Pt called to ask if she could safely get a Covid Shot with all the medications she is currently taking.  Dr. Brita Romp messaged back stating that it sould be fine, but to check with her Rheumatologist about time around her Remicade Infusions.   Pt stated she was told to not take the Covid Shot within the week of having a Remicade Infusion. Pt said she was put on standby with Covid Shot and may not get the shot tonight.  Pt said if she doesn't have the Covid Shot tonight she would call her Rheumatologist at [facility] in the morning to verify what she was told about not getting the shot within the week of having her Remicade infusion.  Dr. Brita Romp messaged back to say pt is outside of the week, she could go ahead and get the shot today if she is able to.

## 2019-04-01 ENCOUNTER — Other Ambulatory Visit: Payer: Self-pay | Admitting: Family Medicine

## 2019-04-01 ENCOUNTER — Other Ambulatory Visit: Payer: Self-pay | Admitting: Physician Assistant

## 2019-04-01 DIAGNOSIS — I10 Essential (primary) hypertension: Secondary | ICD-10-CM

## 2019-04-02 ENCOUNTER — Other Ambulatory Visit: Payer: Self-pay | Admitting: Family Medicine

## 2019-04-02 DIAGNOSIS — F419 Anxiety disorder, unspecified: Secondary | ICD-10-CM

## 2019-04-02 NOTE — Telephone Encounter (Signed)
Medication Refill - Medication: sertraline  Has the patient contacted their pharmacy? Yes.   Pt is requesting a 90 day supply. Please advise.  (Agent: If no, request that the patient contact the pharmacy for the refill.) (Agent: If yes, when and what did the pharmacy advise?)  Preferred Pharmacy (with phone number or street name):  [place]  [address] [place] [address]  Phone: [phone] Fax: [phone]  Not a 24 hour pharmacy; exact hours not known.     Agent: Please be advised that RX refills may take up to 3 business days. We ask that you follow-up with your pharmacy.

## 2019-04-08 DIAGNOSIS — M0579 Rheumatoid arthritis with rheumatoid factor of multiple sites without organ or systems involvement: Secondary | ICD-10-CM | POA: Diagnosis not present

## 2019-04-09 ENCOUNTER — Other Ambulatory Visit: Payer: Self-pay | Admitting: Family Medicine

## 2019-04-09 DIAGNOSIS — Z1231 Encounter for screening mammogram for malignant neoplasm of breast: Secondary | ICD-10-CM

## 2019-04-09 DIAGNOSIS — F419 Anxiety disorder, unspecified: Secondary | ICD-10-CM

## 2019-04-09 MED ORDER — SERTRALINE HCL 50 MG PO TABS: 150.0000 mg | ORAL_TABLET | Freq: Every day | ORAL | 0 refills | Status: DC

## 2019-04-09 NOTE — Telephone Encounter (Signed)
Copied from [id number]. Topic: Quick Communication - Rx Refill/Question >> Apr 09, 2019  1:56 PM Leward Quan A wrote: Medication: sertraline (ZOLOFT) 50 MG tablet  90 day supply please   Has the patient contacted their pharmacy? Yes.   (Agent: If no, request that the patient contact the pharmacy for the refill.) (Agent: If yes, when and what did the pharmacy advise?)  Preferred Pharmacy (with phone number or street name): [place] [facility]  Phone:  [phone] Fax:  [phone]     Agent: Please be advised that RX refills may take up to 3 business days. We ask that you follow-up with your pharmacy.

## 2019-04-11 ENCOUNTER — Other Ambulatory Visit: Payer: Self-pay | Admitting: Family Medicine

## 2019-04-11 DIAGNOSIS — F419 Anxiety disorder, unspecified: Secondary | ICD-10-CM

## 2019-04-11 NOTE — Telephone Encounter (Signed)
Pt was advised by [facility] that she needs a PA for sertraline (ZOLOFT) 50 MG tablet /please advise

## 2019-04-14 MED ORDER — SERTRALINE HCL 50 MG PO TABS: 150.0000 mg | ORAL_TABLET | Freq: Every day | ORAL | 0 refills | Status: DC

## 2019-04-21 DIAGNOSIS — G252 Other specified forms of tremor: Secondary | ICD-10-CM | POA: Diagnosis not present

## 2019-04-21 DIAGNOSIS — G8929 Other chronic pain: Secondary | ICD-10-CM | POA: Diagnosis not present

## 2019-04-21 DIAGNOSIS — M25511 Pain in right shoulder: Secondary | ICD-10-CM | POA: Diagnosis not present

## 2019-04-21 DIAGNOSIS — M0579 Rheumatoid arthritis with rheumatoid factor of multiple sites without organ or systems involvement: Secondary | ICD-10-CM | POA: Diagnosis not present

## 2019-04-28 DIAGNOSIS — M0579 Rheumatoid arthritis with rheumatoid factor of multiple sites without organ or systems involvement: Secondary | ICD-10-CM | POA: Diagnosis not present

## 2019-05-07 ENCOUNTER — Ambulatory Visit (INDEPENDENT_AMBULATORY_CARE_PROVIDER_SITE_OTHER): Payer: Medicare Other | Admitting: Family Medicine

## 2019-05-07 ENCOUNTER — Encounter: Payer: Self-pay | Admitting: Family Medicine

## 2019-05-07 VITALS — BP 117/67 | HR 63 | Temp 96.9°F | Resp 16 | Wt 233.0 lb

## 2019-05-07 DIAGNOSIS — R42 Dizziness and giddiness: Secondary | ICD-10-CM | POA: Insufficient documentation

## 2019-05-07 DIAGNOSIS — F419 Anxiety disorder, unspecified: Secondary | ICD-10-CM | POA: Diagnosis not present

## 2019-05-07 DIAGNOSIS — K219 Gastro-esophageal reflux disease without esophagitis: Secondary | ICD-10-CM

## 2019-05-07 DIAGNOSIS — I10 Essential (primary) hypertension: Secondary | ICD-10-CM | POA: Diagnosis not present

## 2019-05-07 DIAGNOSIS — M069 Rheumatoid arthritis, unspecified: Secondary | ICD-10-CM | POA: Diagnosis not present

## 2019-05-07 DIAGNOSIS — R7303 Prediabetes: Secondary | ICD-10-CM

## 2019-05-07 DIAGNOSIS — E039 Hypothyroidism, unspecified: Secondary | ICD-10-CM

## 2019-05-07 DIAGNOSIS — E7800 Pure hypercholesterolemia, unspecified: Secondary | ICD-10-CM | POA: Diagnosis not present

## 2019-05-07 DIAGNOSIS — E559 Vitamin D deficiency, unspecified: Secondary | ICD-10-CM

## 2019-05-07 DIAGNOSIS — Z7901 Long term (current) use of anticoagulants: Secondary | ICD-10-CM

## 2019-05-07 DIAGNOSIS — H2513 Age-related nuclear cataract, bilateral: Secondary | ICD-10-CM | POA: Diagnosis not present

## 2019-05-07 DIAGNOSIS — E876 Hypokalemia: Secondary | ICD-10-CM

## 2019-05-07 DIAGNOSIS — I480 Paroxysmal atrial fibrillation: Secondary | ICD-10-CM

## 2019-05-07 DIAGNOSIS — G250 Essential tremor: Secondary | ICD-10-CM

## 2019-05-07 LAB — POCT GLYCOSYLATED HEMOGLOBIN (HGB A1C)
Est. average glucose Bld gHb Est-mCnc: 114
Hemoglobin A1C: 5.6 % (ref 4.0–5.6)

## 2019-05-07 MED ORDER — SERTRALINE HCL 50 MG PO TABS: 150.0000 mg | ORAL_TABLET | Freq: Every day | ORAL | 3 refills | Status: DC

## 2019-05-07 MED ORDER — LEVOTHYROXINE SODIUM 100 MCG PO TABS: 100.0000 ug | ORAL_TABLET | Freq: Every day | ORAL | 3 refills | Status: DC

## 2019-05-07 MED ORDER — HYDROCHLOROTHIAZIDE 12.5 MG PO TABS: 12.5000 mg | ORAL_TABLET | Freq: Every day | ORAL | 3 refills | Status: DC

## 2019-05-07 NOTE — Assessment & Plan Note (Signed)
Rate controlled In NSR today cotninue beta blocker and Xarelto Check CBC as on long term anticoag Followed by Cardiology as well

## 2019-05-07 NOTE — Assessment & Plan Note (Signed)
Stable  Continue propranolol

## 2019-05-07 NOTE — Assessment & Plan Note (Signed)
Well controlled Continue current medications Recheck metabolic panel F/u in 6 months  

## 2019-05-07 NOTE — Assessment & Plan Note (Signed)
Followed by Rheum Continue DMARD Check CBC, CMP

## 2019-05-07 NOTE — Assessment & Plan Note (Signed)
Discussed importance of healthy weight management Discussed diet and exercise  

## 2019-05-07 NOTE — Assessment & Plan Note (Signed)
Previously well controlled Continue Synthroid at current dose  Recheck TSH and adjust Synthroid as indicated   

## 2019-05-07 NOTE — Assessment & Plan Note (Signed)
Hemoglobin A1c remains well controlled Continue low carb diet Encourage exercise

## 2019-05-07 NOTE — Assessment & Plan Note (Signed)
Well controlled Continue PPI 

## 2019-05-07 NOTE — Assessment & Plan Note (Signed)
Not currently on a statin Recheck lipid panel

## 2019-05-07 NOTE — Assessment & Plan Note (Signed)
Check CBC as above

## 2019-05-07 NOTE — Assessment & Plan Note (Signed)
Continue supplement Recheck Vit D level 

## 2019-05-07 NOTE — Assessment & Plan Note (Signed)
Neuro intact today No vertigo symptoms or orthostasis As it only occurs with wearing glasses, suspect it is related to vision F/u with Optho as scheduled

## 2019-05-07 NOTE — Assessment & Plan Note (Signed)
Continue KDur Recheck metabolic panel

## 2019-05-07 NOTE — Progress Notes (Signed)
Patient: Glenda Williams Female    DOB: [birth year]   79 y.o.   MRN: [id number] Visit Date: 05/07/2019  Today's Provider: Lavon Paganini, MD   Chief Complaint  Patient presents with  . Hypertension  . Hypothyroidism  . Hyperglycemia   Subjective:    I [place] S. Dimas, CMA, am acting as scribe for Lavon Paganini, MD.  HPI  Hypertension, follow-up:  BP Readings from Last 3 Encounters:  05/07/19 117/67  02/14/19 130/85  02/07/19 139/68    She was last seen for hypertension 6 months ago.  BP at that visit was 138/69. Management changes since that visit include check labs. She reports excellent compliance with treatment. She is not having side effects.  She is not exercising. She is adherent to low salt diet.   Outside blood pressures are stable. She is experiencing lower extremity edema.  Patient denies chest pain.   Cardiovascular risk factors include hypertension.  Use of agents associated with hypertension: none.   Weight trend: stable Wt Readings from Last 3 Encounters:  05/07/19 233 lb (105.7 kg)  02/14/19 216 lb (98 kg)  11/07/18 216 lb 3.2 oz (98.1 kg)   Current diet: in general, a "healthy" diet   ------------------------------------------------------------------------  Hypothyroid, follow-up:  TSH  Date Value Ref Range Status  11/07/2018 2.640 0.450 - 4.500 uIU/mL Final  03/27/2018 2.540 0.450 - 4.500 uIU/mL Final  11/04/2017 6.104 (H) 0.350 - 4.500 uIU/mL Final    Comment:    Performed by a 3rd Generation assay with a functional sensitivity of <=0.01 uIU/mL. Performed at [facility], [address] [place], [place] [address]    Wt Readings from Last 3 Encounters:  05/07/19 233 lb (105.7 kg)  02/14/19 216 lb (98 kg)  11/07/18 216 lb 3.2 oz (98.1 kg)    She was last seen for hypothyroid 6 months ago.  Management since that visit includes check labs. She reports excellent compliance with treatment. She is not having side  effects.  She is not exercising. She is experiencing none She denies change in energy level, diarrhea, palpitations and weight changes Weight trend: stable  ------------------------------------------------------------------------  Prediabetes, Follow-up:   Lab Results  Component Value Date   HGBA1C 5.6 11/07/2018   HGBA1C 5.8 (H) 03/27/2018   HGBA1C 6.0 06/13/2016   GLUCOSE 86 11/07/2018   GLUCOSE 98 03/27/2018   GLUCOSE 100 (H) 11/07/2017    Last seen for for this6 months ago.  Management since that visit includes check labs. Current symptoms include visual disturbances and have been stable.  Weight trend: stable Prior visit with dietician: yes -  Current diet: in general, a "healthy" diet   Current exercise: none  Pertinent Labs:    Component Value Date/Time   CHOL 162 03/27/2018 0940   CHOL 143 02/13/2014 0356   TRIG 84 03/27/2018 0940   TRIG 87 02/13/2014 0356   CHOLHDL 4.2 03/27/2018 0940   CREATININE 0.78 11/07/2018 0953   CREATININE 0.77 02/13/2014 0356    Wt Readings from Last 3 Encounters:  05/07/19 233 lb (105.7 kg)  02/14/19 216 lb (98 kg)  11/07/18 216 lb 3.2 oz (98.1 kg)    Has been feeling dizzy (no vertigo or lightheadedness) only when wearing her glasses and looking at a screen.  She is following up with Optho today  Allergies  Allergen Reactions  . Bactrim [Sulfamethoxazole-Trimethoprim] Rash  . Macrobid [Nitrofurantoin Macrocrystal] Rash  . Mirabegron Rash  . Oxybutynin Rash  . Penicillins  Rash    Has patient had a PCN reaction causing immediate rash, facial/tongue/throat swelling, SOB or lightheadedness with hypotension: Yes Has patient had a PCN reaction causing severe rash involving mucus membranes or skin necrosis: No Has patient had a PCN reaction that required hospitalization: No Has patient had a PCN reaction occurring within the last 10 years: Unknown If all of the above answers are "NO", then may proceed with Cephalosporin use.       Current Outpatient Medications:  .  cholecalciferol (VITAMIN D) 400 units TABS tablet, Take 400 Units by mouth daily., Disp: , Rfl:  .  EPINEPHrine 0.3 mg/0.3 mL IJ SOAJ injection, Inject 0.3 mLs into the skin once as needed for anaphylaxis., Disp: , Rfl: 1 .  folic acid (FOLVITE) 1 MG tablet, Take 1 mg by mouth daily., Disp: , Rfl:  .  hydrochlorothiazide (HYDRODIURIL) 12.5 MG tablet, TAKE 1 TABLET BY MOUTH  DAILY, Disp: 90 tablet, Rfl: 1 .  hydrocortisone cream 1 %, Apply 1 application topically as needed for itching., Disp: , Rfl:  .  levothyroxine (SYNTHROID) 100 MCG tablet, TAKE 1 TABLET BY MOUTH  DAILY, Disp: 90 tablet, Rfl: 0 .  methotrexate (RHEUMATREX) 2.5 MG tablet, Take 20 mg by mouth every Thursday. , Disp: , Rfl:  .  Omega-3 Fatty Acids (FISH OIL) 1000 MG CAPS, Take 1 capsule by mouth daily., Disp: , Rfl:  .  omeprazole (PRILOSEC) 20 MG capsule, TAKE 1 CAPSULE BY MOUTH 2  TIMES DAILY BEFORE MEALS, Disp: 180 capsule, Rfl: 3 .  potassium chloride SA (K-DUR,KLOR-CON) 20 MEQ tablet, Take 1 tablet by mouth  daily, Disp: 90 tablet, Rfl: 1 .  propranolol (INDERAL) 40 MG tablet, 80 mg in the am and 40 mg at night, Disp: , Rfl:  .  Red Yeast Rice 600 MG CAPS, Take 1 capsule by mouth daily., Disp: , Rfl:  .  rivaroxaban (XARELTO) 20 MG TABS tablet, TAKE 1 TABLET BY MOUTH ONCE DAILY, Disp: , Rfl:  .  sertraline (ZOLOFT) 50 MG tablet, Take 3 tablets (150 mg total) by mouth daily., Disp: 270 tablet, Rfl: 0 .  InFLIXimab (REMICADE IV), Inject into the vein. Every 6-8 weeks, Disp: , Rfl:   Review of Systems  Constitutional: Negative.   HENT: Positive for ear pain.   Eyes: Positive for photophobia and visual disturbance.  Respiratory: Negative.   Cardiovascular: Positive for leg swelling. Negative for chest pain and palpitations.    Social History   Tobacco Use  . Smoking status: Never Smoker  . Smokeless tobacco: Never Used  Substance Use Topics  . Alcohol use: No       Objective:   BP 117/67 (BP Location: Left Arm, Patient Position: Sitting, Cuff Size: Large)   Pulse 63   Temp (!) 96.9 F (36.1 C) (Temporal)   Resp 16   Wt 233 lb (105.7 kg)   BMI 38.77 kg/m  Vitals:   05/07/19 0858  BP: 117/67  Pulse: 63  Resp: 16  Temp: (!) 96.9 F (36.1 C)  TempSrc: Temporal  Weight: 233 lb (105.7 kg)  Body mass index is 38.77 kg/m.   Physical Exam Vitals reviewed.  Constitutional:      General: She is not in acute distress.    Appearance: Normal appearance. She is well-developed. She is not diaphoretic.  HENT:     Head: Normocephalic and atraumatic.     Right Ear: Tympanic membrane, ear canal and external ear normal.     Left Ear: Tympanic  membrane, ear canal and external ear normal.  Eyes:     General: No scleral icterus.    Conjunctiva/sclera: Conjunctivae normal.  Neck:     Thyroid: No thyromegaly.     Vascular: No carotid bruit.  Cardiovascular:     Rate and Rhythm: Normal rate and regular rhythm.     Pulses: Normal pulses.     Heart sounds: Normal heart sounds. No murmur.  Pulmonary:     Effort: Pulmonary effort is normal. No respiratory distress.     Breath sounds: Normal breath sounds. No wheezing, rhonchi or rales.  Musculoskeletal:     Cervical back: Neck supple.     Right lower leg: No edema.     Left lower leg: No edema.  Lymphadenopathy:     Cervical: No cervical adenopathy.  Skin:    General: Skin is warm and dry.     Capillary Refill: Capillary refill takes less than 2 seconds.     Findings: No rash.  Neurological:     Mental Status: She is alert and oriented to person, place, and time. Mental status is at baseline.     Cranial Nerves: No cranial nerve deficit.     Sensory: No sensory deficit.     Motor: No weakness.     Coordination: Coordination normal.  Psychiatric:        Mood and Affect: Mood normal.        Behavior: Behavior normal.      No results found for any visits on 05/07/19.     Assessment & Plan     Problem List Items Addressed This Visit      Cardiovascular and Mediastinum   Atrial fibrillation ([place])    Rate controlled In NSR today cotninue beta blocker and Xarelto Check CBC as on long term anticoag Followed by Cardiology as well      Relevant Medications   propranolol (INDERAL) 40 MG tablet   hydrochlorothiazide (HYDRODIURIL) 12.5 MG tablet   Other Relevant Orders   CBC   Hypertension - Primary    Well controlled Continue current medications Recheck metabolic panel F/u in 6 months       Relevant Medications   propranolol (INDERAL) 40 MG tablet   hydrochlorothiazide (HYDRODIURIL) 12.5 MG tablet   Other Relevant Orders   Comprehensive metabolic panel     Digestive   Gastroesophageal reflux disease without esophagitis    Well controlled Continue PPI        Endocrine   Adult hypothyroidism    Previously well controlled Continue Synthroid at current dose  Recheck TSH and adjust Synthroid as indicated       Relevant Medications   propranolol (INDERAL) 40 MG tablet   levothyroxine (SYNTHROID) 100 MCG tablet   Other Relevant Orders   TSH     Nervous and Auditory   Benign essential tremor    Stable  Continue propranolol        Musculoskeletal and Integument   Rheumatoid arthritis (HCC)    Followed by Rheum Continue DMARD Check CBC, CMP      Relevant Orders   CBC     Other   Anxiety    Well controlled Continue Zoloft 150mg  daily      Relevant Medications   sertraline (ZOLOFT) 50 MG tablet   Prediabetes    Hemoglobin A1c remains well controlled Continue low carb diet Encourage exercise      Relevant Orders   POCT glycosylated hemoglobin (Hb A1C) (Completed)   Hypercholesteremia  Not currently on a statin Recheck lipid panel      Relevant Medications   propranolol (INDERAL) 40 MG tablet   hydrochlorothiazide (HYDRODIURIL) 12.5 MG tablet   Other Relevant Orders   Comprehensive metabolic panel   Lipid panel   Avitaminosis D     Continue supplement  Recheck Vit D level      Relevant Orders   VITAMIN D 25 Hydroxy (Vit-D Deficiency, Fractures)   Hypokalemia    Continue KDur Recheck metabolic panel      Relevant Orders   Comprehensive metabolic panel   Morbid obesity ([place])    Discussed importance of healthy weight management Discussed diet and exercise       Relevant Orders   POCT glycosylated hemoglobin (Hb A1C) (Completed)   Comprehensive metabolic panel   Lipid panel   TSH   VITAMIN D 25 Hydroxy (Vit-D Deficiency, Fractures)   CBC   Current use of long term anticoagulation    Check CBC as above      Relevant Orders   CBC   Dizziness    Neuro intact today No vertigo symptoms or orthostasis As it only occurs with wearing glasses, suspect it is related to vision F/u with Optho as scheduled          Return in about 6 months (around 11/07/2019) for AWV, chronic disease f/u.   The entirety of the information documented in the History of Present Illness, Review of Systems and Physical Exam were personally obtained by me. Portions of this information were initially documented by Lynford Humphrey, CMA and reviewed by me for thoroughness and accuracy.    Yulitza Shorts, Dionne Bucy, MD MPH [facility]

## 2019-05-07 NOTE — Assessment & Plan Note (Signed)
Well controlled Continue Zoloft 150mg  daily

## 2019-05-08 ENCOUNTER — Ambulatory Visit
Admission: RE | Admit: 2019-05-08 | Discharge: 2019-05-08 | Disposition: A | Discharge status: Home / Self Care | Payer: Medicare Other | Source: Ambulatory Visit | Attending: Family Medicine | Admitting: Family Medicine

## 2019-05-08 DIAGNOSIS — Z1231 Encounter for screening mammogram for malignant neoplasm of breast: Secondary | ICD-10-CM | POA: Diagnosis not present

## 2019-05-08 LAB — LIPID PANEL
Chol/HDL Ratio: 4.4 ratio (ref 0.0–4.4)
Cholesterol, Total: 170 mg/dL (ref 100–199)
HDL: 39 mg/dL — ABNORMAL LOW (ref >39)
LDL Chol Calc (NIH): 112 mg/dL — ABNORMAL HIGH (ref 0–99)
Triglycerides: 104 mg/dL (ref 0–149)
VLDL Cholesterol Cal: 19 mg/dL (ref 5–40)

## 2019-05-08 LAB — COMPREHENSIVE METABOLIC PANEL
ALT: 12 IU/L (ref 0–32)
AST: 25 IU/L (ref 0–40)
Albumin/Globulin Ratio: 1.3 (ref 1.2–2.2)
Albumin: 3.8 g/dL (ref 3.7–4.7)
Alkaline Phosphatase: 71 IU/L (ref 39–117)
BUN/Creatinine Ratio: 14 (ref 12–28)
BUN: 11 mg/dL (ref 8–27)
Bilirubin Total: 0.6 mg/dL (ref 0.0–1.2)
CO2: 25 mmol/L (ref 20–29)
Calcium: 9.6 mg/dL (ref 8.7–10.3)
Chloride: 98 mmol/L (ref 96–106)
Creatinine, Ser: 0.8 mg/dL (ref 0.57–1.00)
GFR calc Af Amer: 82 mL/min/{1.73_m2} (ref >59)
GFR calc non Af Amer: 71 mL/min/{1.73_m2} (ref >59)
Globulin, Total: 2.9 g/dL (ref 1.5–4.5)
Glucose: 86 mg/dL (ref 65–99)
Potassium: 3.9 mmol/L (ref 3.5–5.2)
Sodium: 140 mmol/L (ref 134–144)
Total Protein: 6.7 g/dL (ref 6.0–8.5)

## 2019-05-08 LAB — CBC
Hematocrit: 41.4 % (ref 34.0–46.6)
Hemoglobin: 13.5 g/dL (ref 11.1–15.9)
MCH: 30.3 pg (ref 26.6–33.0)
MCHC: 32.6 g/dL (ref 31.5–35.7)
MCV: 93 fL (ref 79–97)
Platelets: 269 10*3/uL (ref 150–450)
RBC: 4.45 x10E6/uL (ref 3.77–5.28)
RDW: 13.1 % (ref 11.7–15.4)
WBC: 7 10*3/uL (ref 3.4–10.8)

## 2019-05-08 LAB — VITAMIN D 25 HYDROXY (VIT D DEFICIENCY, FRACTURES): Vit D, 25-Hydroxy: 43.7 ng/mL (ref 30.0–100.0)

## 2019-05-08 LAB — TSH: TSH: 3.42 u[IU]/mL (ref 0.450–4.500)

## 2019-05-09 ENCOUNTER — Telehealth: Payer: Self-pay

## 2019-05-09 NOTE — Telephone Encounter (Signed)
[employer]   Result Notes and Comments to Patient Comment seen by patient Glenda Williams on 05/08/2019 9:27 PM EST

## 2019-05-09 NOTE — Telephone Encounter (Signed)
Comment seen by patient Glenda Williams on 05/09/2019 12:56 PM EST

## 2019-05-09 NOTE — Telephone Encounter (Signed)
-----   Message from Virginia Crews, MD sent at 05/09/2019 12:39 PM EST ----- Normal labs.  Stable cholesterol

## 2019-05-09 NOTE — Telephone Encounter (Signed)
-----   Message from Virginia Crews, MD sent at 05/08/2019  3:59 PM EST ----- Normal mammogram. Repeat in 1 yr

## 2019-05-12 DIAGNOSIS — M0579 Rheumatoid arthritis with rheumatoid factor of multiple sites without organ or systems involvement: Secondary | ICD-10-CM | POA: Diagnosis not present

## 2019-05-27 DIAGNOSIS — G250 Essential tremor: Secondary | ICD-10-CM | POA: Diagnosis not present

## 2019-06-05 IMAGING — MG DIGITAL SCREENING BILATERAL MAMMOGRAM WITH TOMO AND CAD
6 of 10 series · 6 of 30 positions shown · non-contrast
Comparison: Previous exam(s).

CLINICAL DATA: Screening.

EXAM:
DIGITAL SCREENING BILATERAL MAMMOGRAM WITH TOMO AND CAD

[R MLO synth-2D]
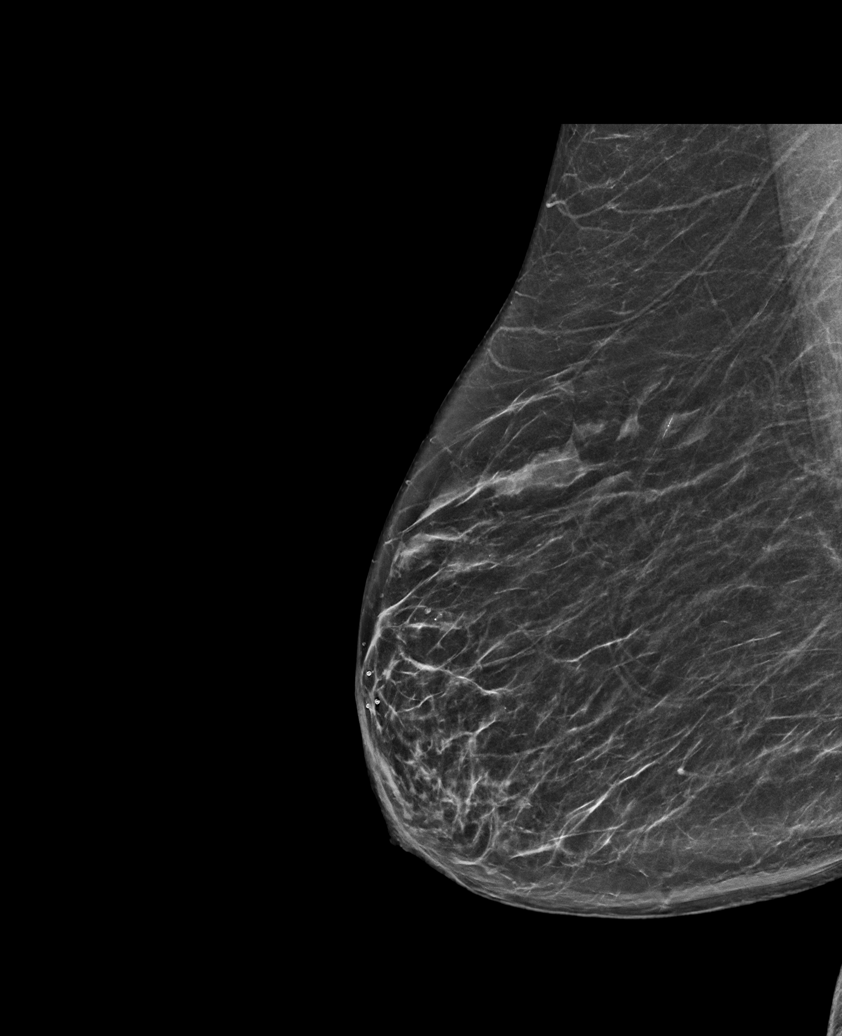

[L MLO synth-2D (1 of 2)]
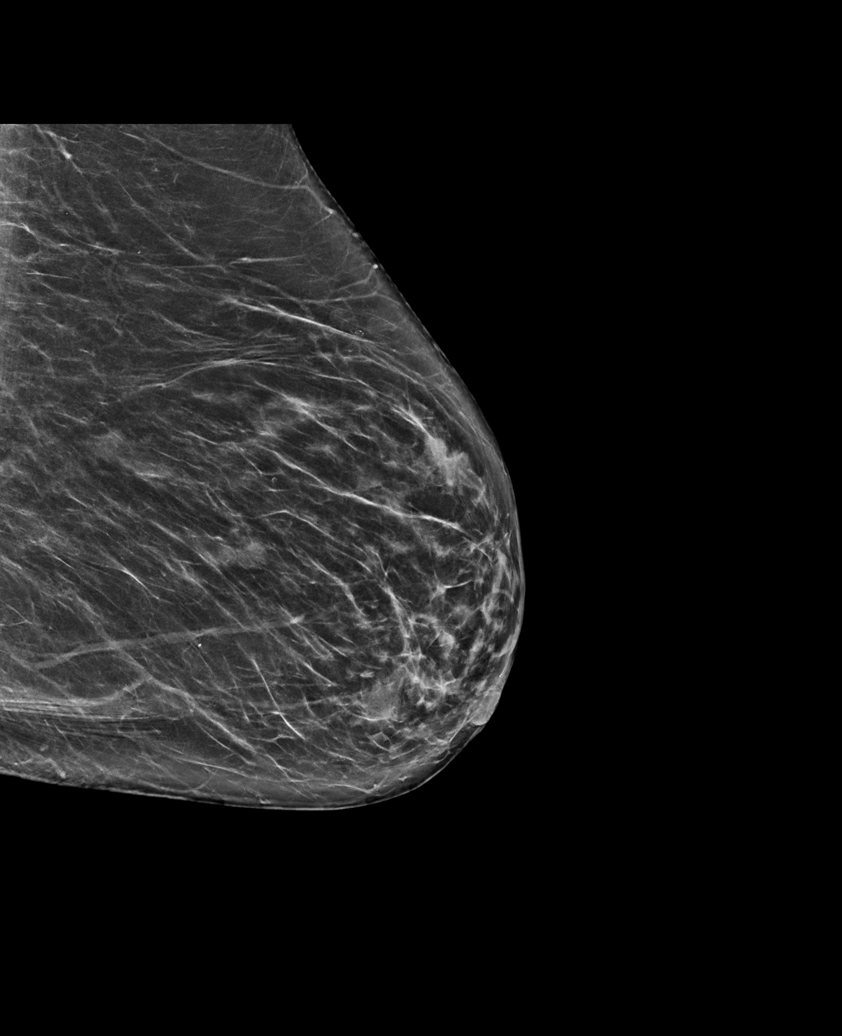

[L MLO synth-2D (2 of 2)]
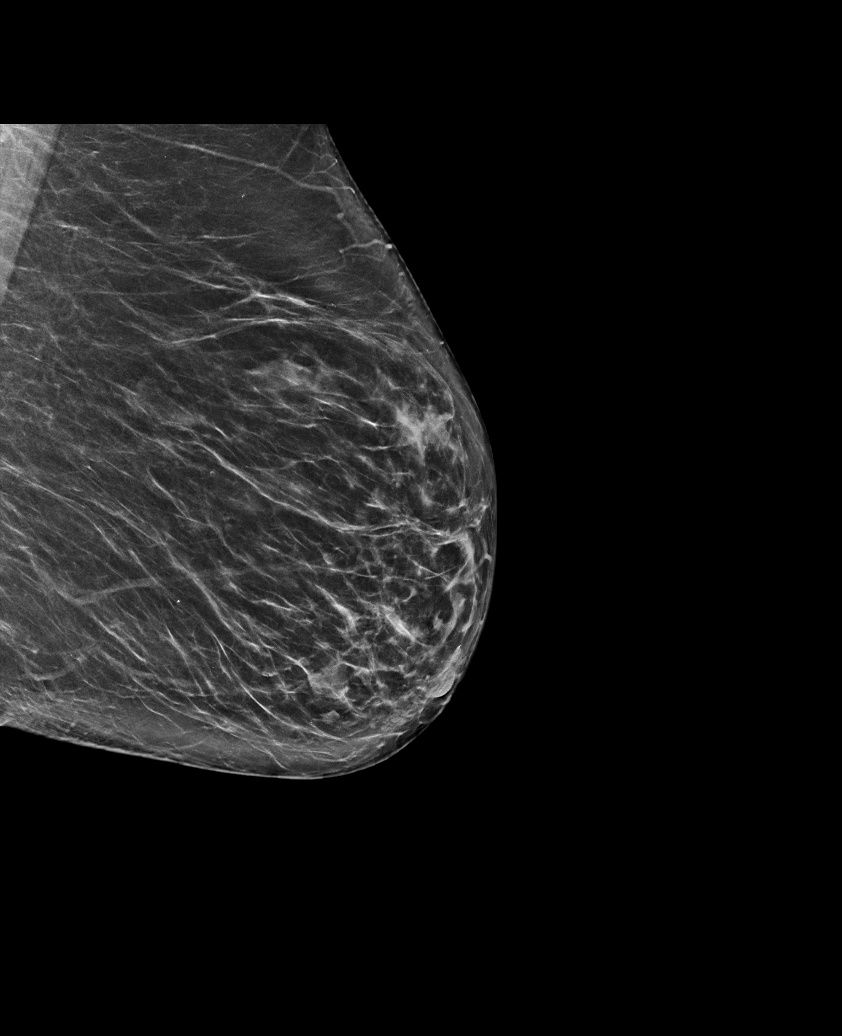

[L CC synth-2D]
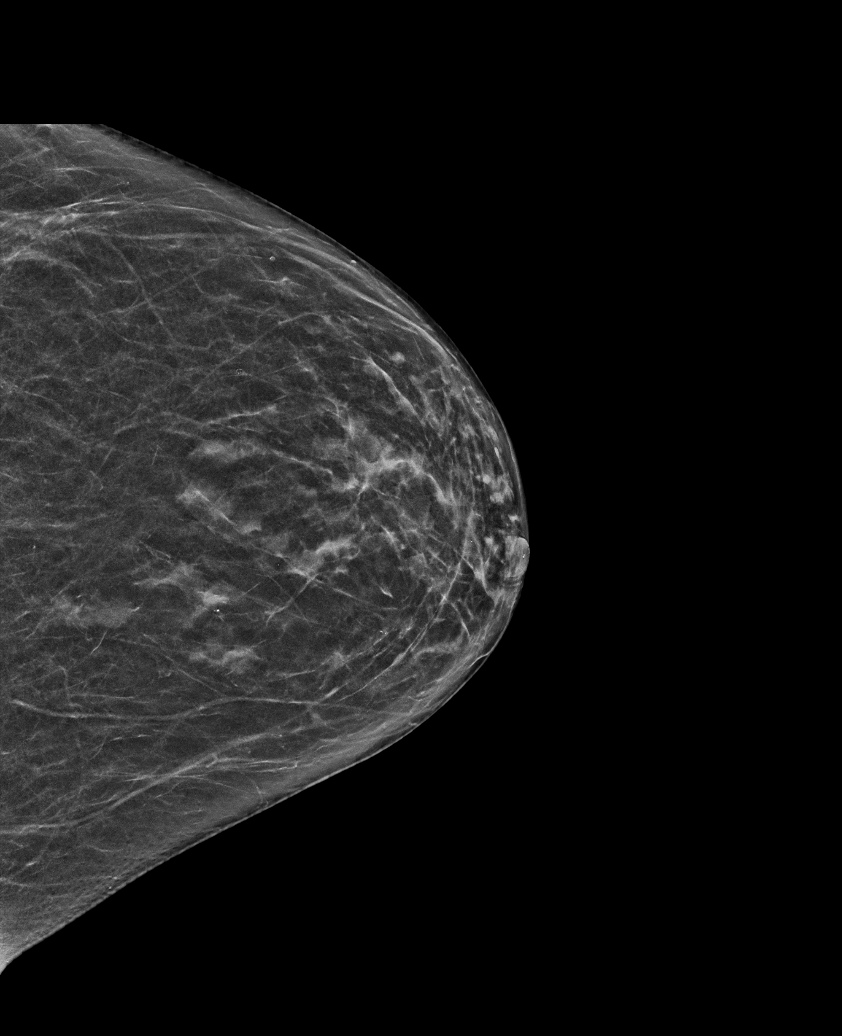

[R CC synth-2D]
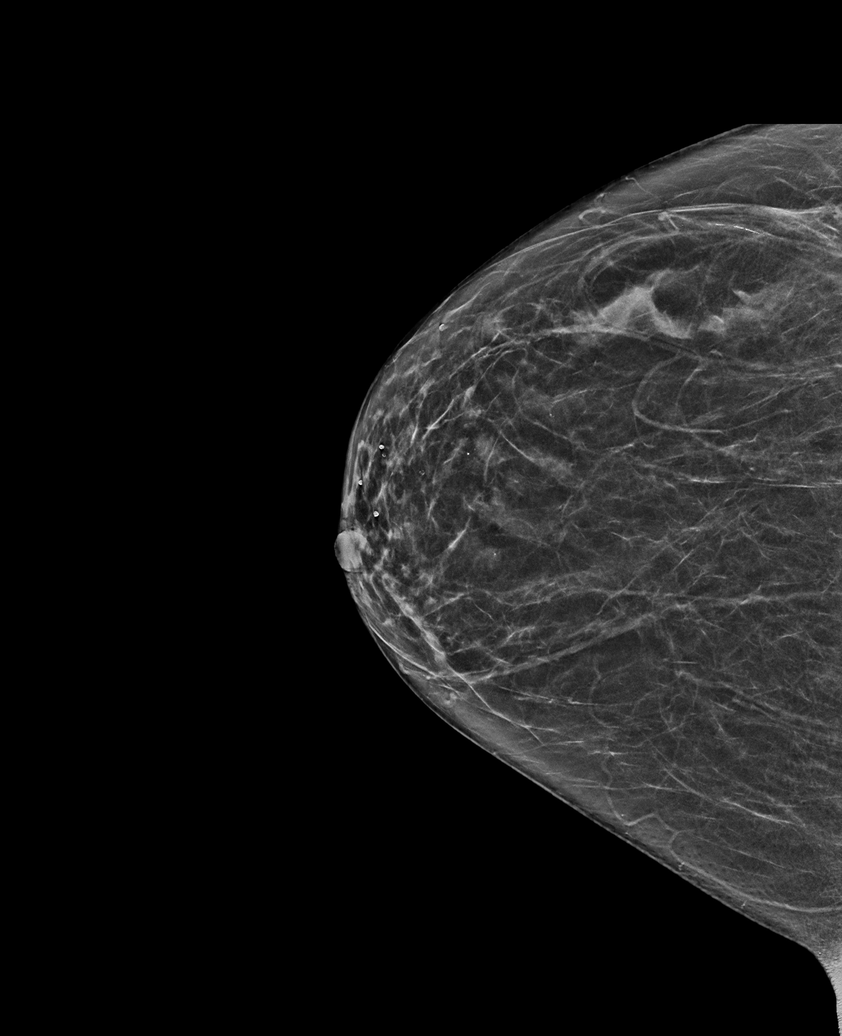

[L CC tomo · tomo slice 29/57.0]
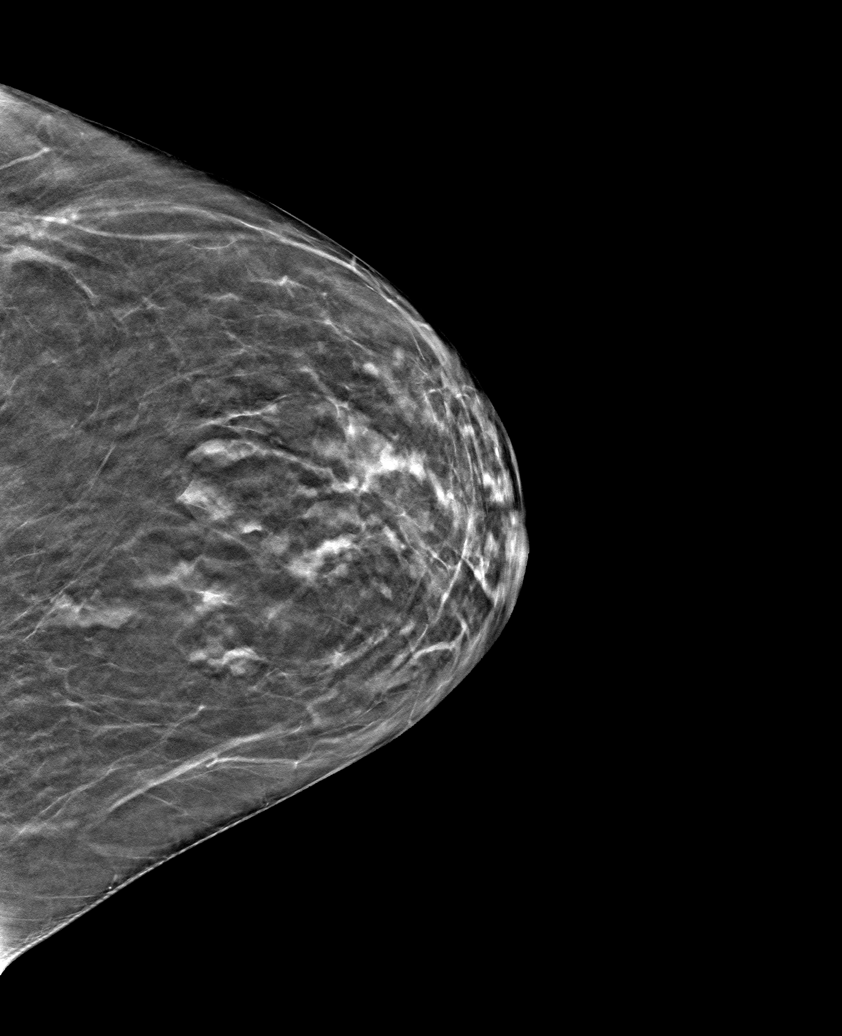

[6 of 30 positions shown; findings below may reference images not displayed]

ACR Breast Density Category b: There are scattered areas of
fibroglandular density.
FINDINGS: There are no findings suspicious for malignancy. Images were
processed with CAD.
IMPRESSION: No mammographic evidence of malignancy. A result letter of this
screening mammogram will be mailed directly to the patient.

RECOMMENDATION:
Screening mammogram in one year. (Code:[id number])

BI-RADS CATEGORY  1: Negative.

## 2019-06-17 DIAGNOSIS — M0579 Rheumatoid arthritis with rheumatoid factor of multiple sites without organ or systems involvement: Secondary | ICD-10-CM | POA: Diagnosis not present

## 2019-07-15 DIAGNOSIS — M0579 Rheumatoid arthritis with rheumatoid factor of multiple sites without organ or systems involvement: Secondary | ICD-10-CM | POA: Diagnosis not present

## 2019-08-12 DIAGNOSIS — M0579 Rheumatoid arthritis with rheumatoid factor of multiple sites without organ or systems involvement: Secondary | ICD-10-CM | POA: Diagnosis not present

## 2019-08-12 DIAGNOSIS — Z79899 Other long term (current) drug therapy: Secondary | ICD-10-CM | POA: Diagnosis not present

## 2019-08-13 DIAGNOSIS — I38 Endocarditis, valve unspecified: Secondary | ICD-10-CM | POA: Insufficient documentation

## 2019-08-13 DIAGNOSIS — R600 Localized edema: Secondary | ICD-10-CM | POA: Diagnosis not present

## 2019-08-13 DIAGNOSIS — G250 Essential tremor: Secondary | ICD-10-CM | POA: Diagnosis not present

## 2019-08-13 DIAGNOSIS — E7800 Pure hypercholesterolemia, unspecified: Secondary | ICD-10-CM | POA: Diagnosis not present

## 2019-08-13 DIAGNOSIS — I10 Essential (primary) hypertension: Secondary | ICD-10-CM | POA: Diagnosis not present

## 2019-08-13 DIAGNOSIS — Z7901 Long term (current) use of anticoagulants: Secondary | ICD-10-CM | POA: Diagnosis not present

## 2019-08-13 DIAGNOSIS — I480 Paroxysmal atrial fibrillation: Secondary | ICD-10-CM | POA: Diagnosis not present

## 2019-10-23 DIAGNOSIS — R2689 Other abnormalities of gait and mobility: Secondary | ICD-10-CM | POA: Diagnosis not present

## 2019-10-23 DIAGNOSIS — G250 Essential tremor: Secondary | ICD-10-CM | POA: Diagnosis not present

## 2019-10-23 DIAGNOSIS — G20 Parkinson's disease: Secondary | ICD-10-CM | POA: Diagnosis not present

## 2019-11-03 DIAGNOSIS — M0579 Rheumatoid arthritis with rheumatoid factor of multiple sites without organ or systems involvement: Secondary | ICD-10-CM | POA: Diagnosis not present

## 2019-11-17 NOTE — Progress Notes (Signed)
Subjective:   Glenda Williams is a 79 y.o. female who presents for Medicare Annual (Subsequent) preventive examination.  I connected with Drexel Iha today by telephone and verified that I am speaking with the correct person using two identifiers. Location patient: home Location provider: work Persons participating in the virtual visit: patient, provider.   I discussed the limitations, risks, security and privacy concerns of performing an evaluation and management service by telephone and the availability of in person appointments. I also discussed with the patient that there may be a patient responsible charge related to this service. The patient expressed understanding and verbally consented to this telephonic visit.    Interactive audio and video telecommunications were attempted between this provider and patient, however failed, due to patient having technical difficulties OR patient did not have access to video capability.  We continued and completed visit with audio only.   Review of Systems    N/A  Cardiac Risk Factors include: advanced age (>22men, >71 women);obesity (BMI >30kg/m2);hypertension;dyslipidemia     Objective:    There were no vitals filed for this visit. There is no height or weight on file to calculate BMI.  Advanced Directives 11/18/2019 10/18/2018 07/08/2018 11/04/2017 11/02/2017 07/05/2017 06/27/2016  Does Patient Have a Medical Advance Directive? Yes Yes Yes Yes Yes Yes Yes  Type of Paramedic of [place];Living will Living will;Healthcare Power of Attorney Living will [place];Living will Living will [place];Living will [place];Living will  Does patient want to make changes to medical advance directive? - No - Patient declined - No - Patient declined - - No - Patient declined  Copy of [place] in Chart? No - copy requested No - copy requested - No - copy requested  - No - copy requested -    Current Medications (verified) Outpatient Encounter Medications as of 11/18/2019  Medication Sig  . Abatacept (ORENCIA [place]) Inject into the skin as directed. Every 4-6 weeks  . cetirizine (ZYRTEC) 10 MG tablet Take 10 mg by mouth daily as needed.   . cholecalciferol (VITAMIN D) 400 units TABS tablet Take 400 Units by mouth daily.  Marland Kitchen EPINEPHrine 0.3 mg/0.3 mL IJ SOAJ injection Inject 0.3 mLs into the skin once as needed for anaphylaxis.  . folic acid (FOLVITE) 1 MG tablet Take 1 mg by mouth daily.  . hydrochlorothiazide (HYDRODIURIL) 12.5 MG tablet Take 1 tablet (12.5 mg total) by mouth daily.  . hydrocortisone cream 1 % Apply 1 application topically as needed for itching.  . levothyroxine (SYNTHROID) 100 MCG tablet Take 1 tablet (100 mcg total) by mouth daily.  . methotrexate (RHEUMATREX) 2.5 MG tablet Take 20 mg by mouth every Thursday.   . Omega-3 Fatty Acids (FISH OIL) 1000 MG CAPS Take 1 capsule by mouth daily.  Marland Kitchen omeprazole (PRILOSEC) 20 MG capsule TAKE 1 CAPSULE BY MOUTH 2  TIMES DAILY BEFORE MEALS  . potassium chloride SA (K-DUR,KLOR-CON) 20 MEQ tablet Take 1 tablet by mouth  daily  . propranolol (INDERAL) 40 MG tablet 80 mg in the am and 40 mg at night  . Red Yeast Rice 600 MG CAPS Take 1 capsule by mouth daily.  . rivaroxaban (XARELTO) 20 MG TABS tablet TAKE 1 TABLET BY MOUTH ONCE DAILY  . sertraline (ZOLOFT) 50 MG tablet Take 3 tablets (150 mg total) by mouth daily.  . InFLIXimab (REMICADE IV) Inject into the vein. Every 6-8 weeks (Patient not taking: Reported on  11/18/2019)   No facility-administered encounter medications on file as of 11/18/2019.    Allergies (verified) Bactrim [sulfamethoxazole-trimethoprim], Macrobid [nitrofurantoin macrocrystal], Mirabegron, Oxybutynin, and Penicillins   History: Past Medical History:  Diagnosis Date  . A-fib ([place])   . Arthritis   . Bell palsy 09/04/2014  . GERD (gastroesophageal reflux disease)   . Heart  disease   . Rheumatoid arthritis ([place])   . Thyroid disease   . Tremors of nervous system    Past Surgical History:  Procedure Laterality Date  . BREAST BIOPSY Left 1988   Benign  . BREAST BIOPSY Left 1987   Benign  . BREAST EXCISIONAL BIOPSY    . BROW LIFT Bilateral 05/23/2016   Procedure: BLEPHAROPLASTY upper eyelid; w/excess skin;  Surgeon: Karle Starch, MD;  Location: [facility];  Service: Ophthalmology;  Laterality: Bilateral;  . CARPAL TUNNEL RELEASE Bilateral   . CHOLECYSTECTOMY     Dr. Bary Castilla  . COLONOSCOPY  2006  . JOINT REPLACEMENT Bilateral    knees  . REPLACEMENT TOTAL KNEE Left 2005  . REPLACEMENT TOTAL KNEE Right 1998   Family History  Problem Relation Age of Onset  . Stroke Mother   . Hypertension Mother   . Heart disease Father   . Arthritis Father   . Parkinson's disease Sister   . Parkinson's disease Brother   . Bladder Cancer Brother   . Breast cancer Neg Hx   . Kidney cancer Neg Hx    Social History   Socioeconomic History  . Marital status: Married    Spouse name: Not on file  . Number of children: 2  . Years of education: Not on file  . Highest education level: Some college, no degree  Occupational History  . Not on file  Tobacco Use  . Smoking status: Never Smoker  . Smokeless tobacco: Never Used  Vaping Use  . Vaping Use: Never used  Substance and Sexual Activity  . Alcohol use: No  . Drug use: No  . Sexual activity: Not on file  Other Topics Concern  . Not on file  Social History Narrative  . Not on file   Social Determinants of Health   Financial Resource Strain: Low Risk   . Difficulty of Paying Living Expenses: Not very hard  Food Insecurity: No Food Insecurity  . Worried About Charity fundraiser in the Last Year: Never true  . Ran Out of Food in the Last Year: Never true  Transportation Needs: No Transportation Needs  . Lack of Transportation (Medical): No  . Lack of Transportation (Non-Medical): No  Physical  Activity: Inactive  . Days of Exercise per Week: 0 days  . Minutes of Exercise per Session: 0 min  Stress: No Stress Concern Present  . Feeling of Stress : Not at all  Social Connections: Moderately Integrated  . Frequency of Communication with Friends and Family: More than three times a week  . Frequency of Social Gatherings with Friends and Family: Three times a week  . Attends Religious Services: Never  . Active Member of Clubs or Organizations: Yes  . Attends Archivist Meetings: 1 to 4 times per year  . Marital Status: Married    Tobacco Counseling Counseling given: Not Answered   Clinical Intake:  Pre-visit preparation completed: Yes  Pain : No/denies pain     Nutritional Risks: None Diabetes: No  How often do you need to have someone help you when you read instructions, pamphlets, or other written  materials from your doctor or pharmacy?: 1 - Never  Diabetic? No  Interpreter Needed?: No  Information entered by :: [facility], LPN   Activities of Daily Living In your present state of health, do you have any difficulty performing the following activities: 11/18/2019  Hearing? N  Vision? N  Difficulty concentrating or making decisions? N  Walking or climbing stairs? Y  Comment Due to back pain.  Dressing or bathing? N  Doing errands, shopping? N  Preparing Food and eating ? N  Using the Toilet? N  In the past six months, have you accidently leaked urine? Y  Comment Has seen a urologist for issue.  Do you have problems with loss of bowel control? N  Managing your Medications? N  Managing your Finances? N  Housekeeping or managing your Housekeeping? N  Some recent data might be hidden    Patient Care Team: Virginia Crews, MD as PCP - General (Family Medicine) Bary Castilla, Forest Gleason, MD (General Surgery) Teodoro Spray, MD as Consulting Physician (Cardiology) Emmaline Kluver., MD as Consulting Physician (Rheumatology) Vladimir Crofts, MD  as Consulting Physician (Neurology) Hollice Espy, MD as Consulting Physician (Urology) Dingeldein, Remo Lipps, MD (Ophthalmology) Sunday Corn Criss Alvine Hubbard Hartshorn (Neurology)  Indicate any recent Medical Services you may have received from other than [facility] providers in the past year (date may be approximate).     Assessment:   This is a routine wellness examination for [place].  Hearing/Vision screen No exam data present  Dietary issues and exercise activities discussed: Current Exercise Habits: The patient does not participate in regular exercise at present, Exercise limited by: None identified  Goals      Patient Stated   .  I want to remain as healthy as I can (pt-stated)      Current Barriers:  Marland Kitchen Knowledge Deficits related to maintaining a healthy lifestyle to decrease risk of developing additional chronic disease states  Nurse Case Manager Clinical Goal(s):  Marland Kitchen Over the next 60 days, patient will demonstrate understanding of plan for health management/maintenance as evidenced by taking all medications as prescribed, checking BP and/or CBG as directed and record, daily exercise as tolerated, adherence to prescribe diet/healthy eating plan, and adherence to all medical appointments.  Interventions:  . Assessed for medication adherence . Assessed for increased activity and exercise . Discussed covid 19 infection prevention precautions . Discussed plans with patient for ongoing care management follow up and provided patient with direct contact information for care management team . Reviewed BP and CBG log  .  Sent previous Scientist, clinical (histocompatibility and immunogenetics) via mail as patient states she did not receive previously sent information. . Encouraged patient to request flu vaccine and pneumonia vaccine if needed during PCP visit 11/07/2018   Patient Self Care Activities:  . Self administers medications as prescribed . Attends all scheduled provider appointments . Calls pharmacy for medication  refills . Attends church or other social activities . Performs ADL's independently . Performs IADL's independently . Calls provider office for new concerns or questions  Please see past updates related to this goal by clicking on the "Past Updates" button in the selected goal        Other   .  DIET - REDUCE CARBOHYDRATE INTAKE      Recommend cutting carbohydrates in half and cutting sugars out completely.     .  Exercise 3x per week (30 min per time)      Recommend to start walking 3 days a week for at  least 30 minutes at a time.      Depression Screen PHQ 2/9 Scores 11/18/2019 07/08/2018 07/08/2018 07/05/2017 11/23/2015 09/04/2014  PHQ - 2 Score 0 0 0 0 0 -  Exception Documentation - - - - - Medical reason    Fall Risk Fall Risk  11/18/2019 02/14/2019 10/18/2018 07/08/2018 07/08/2018  Falls in the past year? 0 0 0 1 0  Comment - - - - -  Number falls in past yr: 0 0 - 0 -  Comment - - - - -  Injury with Fall? 0 0 - 0 -  Follow up - Falls evaluation completed - Falls prevention discussed -    Any stairs in or around the home? No  If so, are there any without handrails? No  Home free of loose throw rugs in walkways, pet beds, electrical cords, etc? Yes  Adequate lighting in your home to reduce risk of falls? Yes   ASSISTIVE DEVICES UTILIZED TO PREVENT FALLS:  Life alert? No  Use of a cane, walker or w/c? Yes  Grab bars in the bathroom? No  Shower chair or bench in shower? Yes  Elevated toilet seat or a handicapped toilet? Yes    Cognitive Function:     6CIT Screen 11/18/2019 07/08/2018 07/05/2017  What Year? 0 points 0 points 0 points  What month? 0 points 0 points 0 points  What time? 0 points 0 points 0 points  Count back from 20 0 points 0 points 0 points  Months in reverse 0 points 0 points 0 points  Repeat phrase 0 points 0 points 0 points  Total Score 0 0 0    Immunizations Immunization History  Administered Date(s) Administered  . Fluad Quad(high Dose 65+) 12/10/2018   . Influenza, High Dose Seasonal PF 12/11/2014, 12/13/2016, 12/27/2017  . Influenza-Unspecified 11/03/2015  . Moderna SARS-COVID-2 Vaccination 03/19/2019, 04/16/2019  . Pneumococcal Conjugate-13 02/23/2014  . Pneumococcal Polysaccharide-23 07/05/2017  . Td 04/21/1999  . Tdap 04/21/1999    TDAP status: Due, Education has been provided regarding the importance of this vaccine. Advised may receive this vaccine at local pharmacy or Health Dept. Aware to provide a copy of the vaccination record if obtained from local pharmacy or Health Dept. Verbalized acceptance and understanding. Flu Vaccine status: Declined, Education has been provided regarding the importance of this vaccine but patient still declined. Advised may receive this vaccine at local pharmacy or Health Dept. Aware to provide a copy of the vaccination record if obtained from local pharmacy or Health Dept. Verbalized acceptance and understanding. Pneumococcal vaccine status: Up to date Covid-19 vaccine status: Completed vaccines  Qualifies for Shingles Vaccine? Yes   Zostavax completed No   Shingrix Completed?: No.    Education has been provided regarding the importance of this vaccine. Patient has been advised to call insurance company to determine out of pocket expense if they have not yet received this vaccine. Advised may also receive vaccine at local pharmacy or Health Dept. Verbalized acceptance and understanding.  Screening Tests Health Maintenance  Topic Date Due  . INFLUENZA VACCINE  10/05/2019  . TETANUS/TDAP  01/25/2023 (Originally 04/20/2009)  . DEXA SCAN  Completed  . COVID-19 Vaccine  Completed  . PNA vac Low Risk Adult  Completed    Health Maintenance  Health Maintenance Due  Topic Date Due  . INFLUENZA VACCINE  10/05/2019    Colorectal cancer screening: No longer required.  Mammogram status: No longer required.  Bone Density status: Completed 11/16/08. Results reflect: Previous  DEXA scan was normal. No  repeat needed unless advised by a physician.  Lung Cancer Screening: (Low Dose CT Chest recommended if Age 22-80 years, 30 pack-year currently smoking OR have quit w/in 15years.) does not qualify.    Additional Screening:  Vision Screening: Recommended annual ophthalmology exams for early detection of glaucoma and other disorders of the eye. Is the patient up to date with their annual eye exam?  Yes  Who is the provider or what is the name of the office in which the patient attends annual eye exams? Dr Dingeldein @ [facility] If pt is not established with a provider, would they like to be referred to a provider to establish care? No .   Dental Screening: Recommended annual dental exams for proper oral hygiene  Community Resource Referral / Chronic Care Management: CRR required this visit?  No   CCM required this visit?  No      Plan:     I have personally reviewed and noted the following in the patient's chart:   . Medical and social history . Use of alcohol, tobacco or illicit drugs  . Current medications and supplements . Functional ability and status . Nutritional status . Physical activity . Advanced directives . List of other physicians . Hospitalizations, surgeries, and ER visits in previous 12 months . Vitals . Screenings to include cognitive, depression, and falls . Referrals and appointments  In addition, I have reviewed and discussed with patient certain preventive protocols, quality metrics, and best practice recommendations. A written personalized care plan for preventive services as well as general preventive health recommendations were provided to patient.     Humberto Addo [place], [place]   7/54/4920   Nurse Notes: Pt to receive her flu shot at today's in office apt.

## 2019-11-18 ENCOUNTER — Encounter: Payer: Self-pay | Admitting: Family Medicine

## 2019-11-18 ENCOUNTER — Other Ambulatory Visit: Payer: Self-pay

## 2019-11-18 ENCOUNTER — Ambulatory Visit (INDEPENDENT_AMBULATORY_CARE_PROVIDER_SITE_OTHER): Payer: Medicare Other | Admitting: Family Medicine

## 2019-11-18 ENCOUNTER — Ambulatory Visit (INDEPENDENT_AMBULATORY_CARE_PROVIDER_SITE_OTHER): Payer: Medicare Other

## 2019-11-18 VITALS — BP 128/72 | HR 71 | Temp 98.4°F | Ht 65.5 in | Wt 225.0 lb

## 2019-11-18 DIAGNOSIS — R7303 Prediabetes: Secondary | ICD-10-CM

## 2019-11-18 DIAGNOSIS — E039 Hypothyroidism, unspecified: Secondary | ICD-10-CM | POA: Diagnosis not present

## 2019-11-18 DIAGNOSIS — Z23 Encounter for immunization: Secondary | ICD-10-CM | POA: Diagnosis not present

## 2019-11-18 DIAGNOSIS — Z0000 Encounter for general adult medical examination without abnormal findings: Secondary | ICD-10-CM | POA: Diagnosis not present

## 2019-11-18 DIAGNOSIS — M069 Rheumatoid arthritis, unspecified: Secondary | ICD-10-CM

## 2019-11-18 DIAGNOSIS — I480 Paroxysmal atrial fibrillation: Secondary | ICD-10-CM | POA: Diagnosis not present

## 2019-11-18 DIAGNOSIS — E7800 Pure hypercholesterolemia, unspecified: Secondary | ICD-10-CM | POA: Diagnosis not present

## 2019-11-18 DIAGNOSIS — I10 Essential (primary) hypertension: Secondary | ICD-10-CM

## 2019-11-18 NOTE — Assessment & Plan Note (Signed)
Previously well controlled Continue Synthroid at current dose  Recheck TSH and adjust Synthroid as indicated   

## 2019-11-18 NOTE — Assessment & Plan Note (Signed)
Recheck A1c Discussed low carb diet

## 2019-11-18 NOTE — Patient Instructions (Signed)
Glenda Williams , Thank you for taking time to come for your Medicare Wellness Visit. I appreciate your ongoing commitment to your health goals. Please review the following plan we discussed and let me know if I can assist you in the future.   Screening recommendations/referrals: Colonoscopy: No longer required.  Mammogram: No longer required.  Bone Density: Previous DEXA scan was normal. No repeat needed unless advised by a physician. Recommended yearly ophthalmology/optometry visit for glaucoma screening and checkup Recommended yearly dental visit for hygiene and checkup  Vaccinations: Influenza vaccine: Currently due Pneumococcal vaccine: Completed series Tdap vaccine: Currently due, declined at this time. Shingles vaccine: Shingrix discussed. Please contact your pharmacy for coverage information.     Advanced directives: Please bring a copy of your POA (Power of Attorney) and/or Living Will to your next appointment.   Conditions/risks identified: Recommend to start walking 3 days a week for at least 30 minutes at a time. Also continue to cut back on carbohydrate and sugar intake.   Next appointment: 10:00 AM today with Dr Brita Romp. Declined scheduling an AWV for 2022 at this time.   Preventive Care 78 Years and Older, Female Preventive care refers to lifestyle choices and visits with your health care provider that can promote health and wellness. What does preventive care include?  A yearly physical exam. This is also called an annual well check.  Dental exams once or twice a year.  Routine eye exams. Ask your health care provider how often you should have your eyes checked.  Personal lifestyle choices, including:  Daily care of your teeth and gums.  Regular physical activity.  Eating a healthy diet.  Avoiding tobacco and drug use.  Limiting alcohol use.  Practicing safe sex.  Taking low-dose aspirin every day.  Taking vitamin and mineral supplements as recommended  by your health care provider. What happens during an annual well check? The services and screenings done by your health care provider during your annual well check will depend on your age, overall health, lifestyle risk factors, and family history of disease. Counseling  Your health care provider may ask you questions about your:  Alcohol use.  Tobacco use.  Drug use.  Emotional well-being.  Home and relationship well-being.  Sexual activity.  Eating habits.  History of falls.  Memory and ability to understand (cognition).  Work and work Statistician.  Reproductive health. Screening  You may have the following tests or measurements:  Height, weight, and BMI.  Blood pressure.  Lipid and cholesterol levels. These may be checked every 5 years, or more frequently if you are over 78 years old.  Skin check.  Lung cancer screening. You may have this screening every year starting at age 78 if you have a 30-pack-year history of smoking and currently smoke or have quit within the past 15 years.  Fecal occult blood test (FOBT) of the stool. You may have this test every year starting at age 78.  Flexible sigmoidoscopy or colonoscopy. You may have a sigmoidoscopy every 5 years or a colonoscopy every 10 years starting at age 78.  Hepatitis C blood test.  Hepatitis B blood test.  Sexually transmitted disease (STD) testing.  Diabetes screening. This is done by checking your blood sugar (glucose) after you have not eaten for a while (fasting). You may have this done every 1-3 years.  Bone density scan. This is done to screen for osteoporosis. You may have this done starting at age 78.  Mammogram. This may be done every  1-2 years. Talk to your health care provider about how often you should have regular mammograms. Talk with your health care provider about your test results, treatment options, and if necessary, the need for more tests. Vaccines  Your health care provider may  recommend certain vaccines, such as:  Influenza vaccine. This is recommended every year.  Tetanus, diphtheria, and acellular pertussis (Tdap, Td) vaccine. You may need a Td booster every 10 years.  Zoster vaccine. You may need this after age 21.  Pneumococcal 13-valent conjugate (PCV13) vaccine. One dose is recommended after age 78.  Pneumococcal polysaccharide (PPSV23) vaccine. One dose is recommended after age 78. Talk to your health care provider about which screenings and vaccines you need and how often you need them. This information is not intended to replace advice given to you by your health care provider. Make sure you discuss any questions you have with your health care provider. Document Released: 03/19/2015 Document Revised: 11/10/2015 Document Reviewed: 12/22/2014 Elsevier Interactive Patient Education  2017 [place] Prevention in the Home Falls can cause injuries. They can happen to people of all ages. There are many things you can do to make your home safe and to help prevent falls. What can I do on the outside of my home?  Regularly fix the edges of walkways and driveways and fix any cracks.  Remove anything that might make you trip as you walk through a door, such as a raised step or threshold.  Trim any bushes or trees on the path to your home.  Use bright outdoor lighting.  Clear any walking paths of anything that might make someone trip, such as rocks or tools.  Regularly check to see if handrails are loose or broken. Make sure that both sides of any steps have handrails.  Any raised decks and porches should have guardrails on the edges.  Have any leaves, snow, or ice cleared regularly.  Use sand or salt on walking paths during winter.  Clean up any spills in your garage right away. This includes oil or grease spills. What can I do in the bathroom?  Use night lights.  Install grab bars by the toilet and in the tub and shower. Do not use towel  bars as grab bars.  Use non-skid mats or decals in the tub or shower.  If you need to sit down in the shower, use a plastic, non-slip stool.  Keep the floor dry. Clean up any water that spills on the floor as soon as it happens.  Remove soap buildup in the tub or shower regularly.  Attach bath mats securely with double-sided non-slip rug tape.  Do not have throw rugs and other things on the floor that can make you trip. What can I do in the bedroom?  Use night lights.  Make sure that you have a light by your bed that is easy to reach.  Do not use any sheets or blankets that are too big for your bed. They should not hang down onto the floor.  Have a firm chair that has side arms. You can use this for support while you get dressed.  Do not have throw rugs and other things on the floor that can make you trip. What can I do in the kitchen?  Clean up any spills right away.  Avoid walking on wet floors.  Keep items that you use a lot in easy-to-reach places.  If you need to reach something above you, use a strong  step stool that has a grab bar.  Keep electrical cords out of the way.  Do not use floor polish or wax that makes floors slippery. If you must use wax, use non-skid floor wax.  Do not have throw rugs and other things on the floor that can make you trip. What can I do with my stairs?  Do not leave any items on the stairs.  Make sure that there are handrails on both sides of the stairs and use them. Fix handrails that are broken or loose. Make sure that handrails are as long as the stairways.  Check any carpeting to make sure that it is firmly attached to the stairs. Fix any carpet that is loose or worn.  Avoid having throw rugs at the top or bottom of the stairs. If you do have throw rugs, attach them to the floor with carpet tape.  Make sure that you have a light switch at the top of the stairs and the bottom of the stairs. If you do not have them, ask someone to  add them for you. What else can I do to help prevent falls?  Wear shoes that:  Do not have high heels.  Have rubber bottoms.  Are comfortable and fit you well.  Are closed at the toe. Do not wear sandals.  If you use a stepladder:  Make sure that it is fully opened. Do not climb a closed stepladder.  Make sure that both sides of the stepladder are locked into place.  Ask someone to hold it for you, if possible.  Clearly mark and make sure that you can see:  Any grab bars or handrails.  First and last steps.  Where the edge of each step is.  Use tools that help you move around (mobility aids) if they are needed. These include:  Canes.  Walkers.  Scooters.  Crutches.  Turn on the lights when you go into a dark area. Replace any light bulbs as soon as they burn out.  Set up your furniture so you have a clear path. Avoid moving your furniture around.  If any of your floors are uneven, fix them.  If there are any pets around you, be aware of where they are.  Review your medicines with your doctor. Some medicines can make you feel dizzy. This can increase your chance of falling. Ask your doctor what other things that you can do to help prevent falls. This information is not intended to replace advice given to you by your health care provider. Make sure you discuss any questions you have with your health care provider. Document Released: 12/17/2008 Document Revised: 07/29/2015 Document Reviewed: 03/27/2014 Elsevier Interactive Patient Education  2017 [employer].

## 2019-11-18 NOTE — Assessment & Plan Note (Signed)
Discussed importance of healthy weight management Discussed diet and exercise  

## 2019-11-18 NOTE — Progress Notes (Signed)
I,Laura E Walsh,acting as a scribe for Lavon Paganini, MD.,have documented all relevant documentation on the behalf of Lavon Paganini, MD,as directed by  Lavon Paganini, MD while in the presence of Lavon Paganini, MD.    Established patient visit   Patient: Glenda Williams   DOB: 07-Sep-1940   79 y.o. Female  MRN: [id number] Visit Date: 11/18/2019  Today's healthcare provider: Lavon Paganini, MD   Chief Complaint  Patient presents with  . Hyperlipidemia  . Hypertension   Subjective    HPI   Hypertension, follow-up  BP Readings from Last 3 Encounters:  11/18/19 128/72  05/07/19 117/67  02/14/19 130/85   Wt Readings from Last 3 Encounters:  11/18/19 225 lb (102.1 kg)  05/07/19 233 lb (105.7 kg)  02/14/19 216 lb (98 kg)     She was last seen for hypertension 6 months ago.  BP at that visit was 117/67. Management since that visit includes no changes.  She reports excellent compliance with treatment. She is not having side effects.  She is following a Regular diet. She is exercising. She does not smoke.  Use of agents associated with hypertension: none.   Outside blood pressures are normal at home. Symptoms: No chest pain No chest pressure  No palpitations No syncope  No dyspnea No orthopnea  No paroxysmal nocturnal dyspnea Yes lower extremity edema   Pertinent labs: Lab Results  Component Value Date   CHOL 170 05/07/2019   HDL 39 (L) 05/07/2019   LDLCALC 112 (H) 05/07/2019   TRIG 104 05/07/2019   CHOLHDL 4.4 05/07/2019   Lab Results  Component Value Date   NA 140 05/07/2019   K 3.9 05/07/2019   CREATININE 0.80 05/07/2019   GFRNONAA 71 05/07/2019   GFRAA 82 05/07/2019   GLUCOSE 86 05/07/2019     The 10-year ASCVD risk score Mikey Bussing DC Jr., et al., 2013) is: 27.2%   --------------------------------------------------------------------------------------------------- Lipid/Cholesterol, Follow-up  Last lipid panel Other pertinent labs    Lab Results  Component Value Date   CHOL 170 05/07/2019   HDL 39 (L) 05/07/2019   LDLCALC 112 (H) 05/07/2019   TRIG 104 05/07/2019   CHOLHDL 4.4 05/07/2019   Lab Results  Component Value Date   ALT 12 05/07/2019   AST 25 05/07/2019   PLT 269 05/07/2019   TSH 3.420 05/07/2019     She was last seen for this 6 months ago.  Management since that visit includes no changes.  She reports excellent compliance with treatment. She is not having side effects.   Symptoms: No chest pain No chest pressure/discomfort  No dyspnea Yes lower extremity edema  No numbness or tingling of extremity No orthopnea  No palpitations No paroxysmal nocturnal dyspnea  No speech difficulty No syncope   Current diet: in general, a "healthy" diet    The 10-year ASCVD risk score Mikey Bussing DC Brooke Bonito., et al., 2013) is: 27.2%  ---------------------------------------------------------------------------------------------------   Patient Active Problem List   Diagnosis Date Noted  . Current use of long term anticoagulation 05/07/2019  . Dizziness 05/07/2019  . Hypertension 04/26/2015  . Dry mouth 04/26/2015  . Morbid obesity ([place]) 09/04/2014  . Hypokalemia 08/12/2014  . Allergic rhinitis 08/04/2014  . Anxiety 08/04/2014  . Cannot sleep 08/04/2014  . Neuropathic pain 08/04/2014  . Atrial fibrillation ([place]) 08/04/2014  . Avitaminosis D 08/04/2014  . Gastroesophageal reflux disease without esophagitis 11/07/2013  . Polypharmacy 08/19/2013  . Osteoarthritis 06/11/2013  . Rheumatoid arthritis ([place]) 06/11/2013  . Hypercholesteremia  05/12/2009  . Benign essential tremor 05/12/2009  . Prediabetes 08/26/2008  . Adult hypothyroidism 08/26/2008  . Menopausal symptom 08/26/2008   Past Medical History:  Diagnosis Date  . A-fib ([place])   . Arthritis   . Bell palsy 09/04/2014  . GERD (gastroesophageal reflux disease)   . Heart disease   . Rheumatoid arthritis ([place])   . Thyroid disease   . Tremors of nervous  system    Social History   Tobacco Use  . Smoking status: Never Smoker  . Smokeless tobacco: Never Used  Vaping Use  . Vaping Use: Never used  Substance Use Topics  . Alcohol use: No  . Drug use: No   Allergies  Allergen Reactions  . Bactrim [Sulfamethoxazole-Trimethoprim] Rash  . Macrobid [Nitrofurantoin Macrocrystal] Rash  . Mirabegron Rash  . Oxybutynin Rash  . Penicillins Rash    Has patient had a PCN reaction causing immediate rash, facial/tongue/throat swelling, SOB or lightheadedness with hypotension: Yes Has patient had a PCN reaction causing severe rash involving mucus membranes or skin necrosis: No Has patient had a PCN reaction that required hospitalization: No Has patient had a PCN reaction occurring within the last 10 years: Unknown If all of the above answers are "NO", then may proceed with Cephalosporin use.      Medications: Outpatient Medications Prior to Visit  Medication Sig  . Abatacept (ORENCIA [place]) Inject into the skin as directed. Every 4-6 weeks  . cetirizine (ZYRTEC) 10 MG tablet Take 10 mg by mouth daily as needed.   . cholecalciferol (VITAMIN D) 400 units TABS tablet Take 400 Units by mouth daily.  Marland Kitchen EPINEPHrine 0.3 mg/0.3 mL IJ SOAJ injection Inject 0.3 mLs into the skin once as needed for anaphylaxis.  . folic acid (FOLVITE) 1 MG tablet Take 1 mg by mouth daily.  . hydrochlorothiazide (HYDRODIURIL) 12.5 MG tablet Take 1 tablet (12.5 mg total) by mouth daily.  . hydrocortisone cream 1 % Apply 1 application topically as needed for itching.  . levothyroxine (SYNTHROID) 100 MCG tablet Take 1 tablet (100 mcg total) by mouth daily.  . methotrexate (RHEUMATREX) 2.5 MG tablet Take 20 mg by mouth every Thursday.   . Omega-3 Fatty Acids (FISH OIL) 1000 MG CAPS Take 1 capsule by mouth daily.  Marland Kitchen omeprazole (PRILOSEC) 20 MG capsule TAKE 1 CAPSULE BY MOUTH 2  TIMES DAILY BEFORE MEALS  . potassium chloride SA (K-DUR,KLOR-CON) 20 MEQ tablet Take 1 tablet by mouth   daily  . propranolol (INDERAL) 40 MG tablet 80 mg in the am and 40 mg at night  . Red Yeast Rice 600 MG CAPS Take 1 capsule by mouth daily.  . rivaroxaban (XARELTO) 20 MG TABS tablet TAKE 1 TABLET BY MOUTH ONCE DAILY  . sertraline (ZOLOFT) 50 MG tablet Take 3 tablets (150 mg total) by mouth daily.  . [DISCONTINUED] InFLIXimab (REMICADE IV) Inject into the vein. Every 6-8 weeks (Patient not taking: Reported on 11/18/2019)   No facility-administered medications prior to visit.    Review of Systems  Constitutional: Negative.   HENT: Negative.   Eyes: Negative.   Respiratory: Negative.   Cardiovascular: Positive for leg swelling. Negative for chest pain and palpitations.  Gastrointestinal: Positive for abdominal distention. Negative for abdominal pain, anal bleeding, blood in stool, constipation, diarrhea, nausea, rectal pain and vomiting.  Endocrine: Negative.   Genitourinary: Negative.   Musculoskeletal: Positive for arthralgias, back pain, joint swelling, myalgias, neck pain and neck stiffness. Negative for gait problem.  Skin: Negative.  Neurological: Positive for dizziness, tremors and light-headedness. Negative for seizures, syncope, facial asymmetry, speech difficulty, weakness, numbness and headaches.  Hematological: Negative.   Psychiatric/Behavioral: Negative.       Objective    BP 128/72 (BP Location: Right Arm, Patient Position: Sitting, Cuff Size: Large)   Pulse 71   Temp 98.4 F (36.9 C) (Oral)   Ht 5' 5.5" (1.664 m)   Wt 225 lb (102.1 kg)   SpO2 97%   BMI 36.87 kg/m    Physical Exam Vitals reviewed.  Constitutional:      General: She is not in acute distress.    Appearance: Normal appearance. She is well-developed. She is not diaphoretic.  HENT:     Head: Normocephalic and atraumatic.     Right Ear: Tympanic membrane, ear canal and external ear normal.     Left Ear: Tympanic membrane, ear canal and external ear normal.     Mouth/Throat:     Pharynx: No  oropharyngeal exudate.  Eyes:     General: No scleral icterus.    Conjunctiva/sclera: Conjunctivae normal.     Pupils: Pupils are equal, round, and reactive to light.  Neck:     Thyroid: No thyromegaly.  Cardiovascular:     Rate and Rhythm: Normal rate and regular rhythm.     Pulses: Normal pulses.     Heart sounds: Normal heart sounds. No murmur heard.   Pulmonary:     Effort: Pulmonary effort is normal. No respiratory distress.     Breath sounds: Normal breath sounds. No wheezing or rales.  Abdominal:     General: There is no distension.     Palpations: Abdomen is soft.     Tenderness: There is no abdominal tenderness.  Musculoskeletal:        General: No deformity.     Cervical back: Neck supple.     Right lower leg: No edema.     Left lower leg: No edema.  Lymphadenopathy:     Cervical: No cervical adenopathy.  Skin:    General: Skin is warm and dry.     Findings: No rash.  Neurological:     Mental Status: She is alert and oriented to person, place, and time. Mental status is at baseline.     Sensory: No sensory deficit.     Motor: No weakness.     Gait: Gait normal.  Psychiatric:        Mood and Affect: Mood normal.        Behavior: Behavior normal.        Thought Content: Thought content normal.       No results found for any visits on 11/18/19.  Assessment & Plan     Problem List Items Addressed This Visit      Cardiovascular and Mediastinum   Atrial fibrillation ([place])    Rate controlled, in NSR today Continue BBlocker and Xarelto - samples given today Reviewed last CBC Followed by Cardiology as well      Hypertension - Primary    Well controlled Continue current medications Recheck metabolic panel F/u in 6 months       Relevant Orders   Lipid panel   Comprehensive metabolic panel     Endocrine   Adult hypothyroidism    Previously well controlled Continue Synthroid at current dose  Recheck TSH and adjust Synthroid as indicated        Relevant Orders   TSH     Musculoskeletal and Integument   Rheumatoid arthritis (  [place])    Followed by [facility] Advised to get COVID 19 vaccine dose #3 for immunocompromised individuals        Other   Prediabetes    Recheck A1c Discussed low carb diet      Relevant Orders   Hemoglobin A1c   Comprehensive metabolic panel   Hypercholesteremia    Previously well controlled Not currently on a statin.  Continue to work on lifestyle modifications. Repeat FLP and CMP Goal LDL < 100       Relevant Orders   Lipid panel   Comprehensive metabolic panel   Morbid obesity ([place])    Discussed importance of healthy weight management Discussed diet and exercise        Other Visit Diagnoses    Need for influenza vaccination       Relevant Orders   Flu Vaccine QUAD High Dose(Fluad)       Return in about 6 months (around 05/17/2020).      I, Lavon Paganini, MD, have reviewed all documentation for this visit. The documentation on 11/18/19 for the exam, diagnosis, procedures, and orders are all accurate and complete.   Odas Ozer, Dionne Bucy, MD, MPH [facility]

## 2019-11-18 NOTE — Assessment & Plan Note (Signed)
Rate controlled, in NSR today Continue BBlocker and Xarelto - samples given today Reviewed last CBC Followed by Cardiology as well

## 2019-11-18 NOTE — Patient Instructions (Signed)
Preventive Care 38 Years and Older, Female Preventive care refers to lifestyle choices and visits with your health care provider that can promote health and wellness. This includes:  A yearly physical exam. This is also called an annual well check.  Regular dental and eye exams.  Immunizations.  Screening for certain conditions.  Healthy lifestyle choices, such as diet and exercise. What can I expect for my preventive care visit? Physical exam Your health care provider will check:  Height and weight. These may be used to calculate body mass index (BMI), which is a measurement that tells if you are at a healthy weight.  Heart rate and blood pressure.  Your skin for abnormal spots. Counseling Your health care provider may ask you questions about:  Alcohol, tobacco, and drug use.  Emotional well-being.  Home and relationship well-being.  Sexual activity.  Eating habits.  History of falls.  Memory and ability to understand (cognition).  Work and work Statistician.  Pregnancy and menstrual history. What immunizations do I need?  Influenza (flu) vaccine  This is recommended every year. Tetanus, diphtheria, and pertussis (Tdap) vaccine  You may need a Td booster every 10 years. Varicella (chickenpox) vaccine  You may need this vaccine if you have not already been vaccinated. Zoster (shingles) vaccine  You may need this after age 33. Pneumococcal conjugate (PCV13) vaccine  One dose is recommended after age 33. Pneumococcal polysaccharide (PPSV23) vaccine  One dose is recommended after age 72. Measles, mumps, and rubella (MMR) vaccine  You may need at least one dose of MMR if you were born in 1957 or later. You may also need a second dose. Meningococcal conjugate (MenACWY) vaccine  You may need this if you have certain conditions. Hepatitis A vaccine  You may need this if you have certain conditions or if you travel or work in places where you may be exposed  to hepatitis A. Hepatitis B vaccine  You may need this if you have certain conditions or if you travel or work in places where you may be exposed to hepatitis B. Haemophilus influenzae type b (Hib) vaccine  You may need this if you have certain conditions. You may receive vaccines as individual doses or as more than one vaccine together in one shot (combination vaccines). Talk with your health care provider about the risks and benefits of combination vaccines. What tests do I need? Blood tests  Lipid and cholesterol levels. These may be checked every 5 years, or more frequently depending on your overall health.  Hepatitis C test.  Hepatitis B test. Screening  Lung cancer screening. You may have this screening every year starting at age 39 if you have a 30-pack-year history of smoking and currently smoke or have quit within the past 15 years.  Colorectal cancer screening. All adults should have this screening starting at age 36 and continuing until age 15. Your health care provider may recommend screening at age 23 if you are at increased risk. You will have tests every 1-10 years, depending on your results and the type of screening test.  Diabetes screening. This is done by checking your blood sugar (glucose) after you have not eaten for a while (fasting). You may have this done every 1-3 years.  Mammogram. This may be done every 1-2 years. Talk with your health care provider about how often you should have regular mammograms.  BRCA-related cancer screening. This may be done if you have a family history of breast, ovarian, tubal, or peritoneal cancers.  Other tests  Sexually transmitted disease (STD) testing.  Bone density scan. This is done to screen for osteoporosis. You may have this done starting at age 44. Follow these instructions at home: Eating and drinking  Eat a diet that includes fresh fruits and vegetables, whole grains, lean protein, and low-fat dairy products. Limit  your intake of foods with high amounts of sugar, saturated fats, and salt.  Take vitamin and mineral supplements as recommended by your health care provider.  Do not drink alcohol if your health care provider tells you not to drink.  If you drink alcohol: ? Limit how much you have to 0-1 drink a day. ? Be aware of how much alcohol is in your drink. In the [place] one drink equals one 12 oz bottle of beer (355 mL), one 5 oz glass of wine (148 mL), or one 1 oz glass of hard liquor (44 mL). Lifestyle  Take daily care of your teeth and gums.  Stay active. Exercise for at least 30 minutes on 5 or more days each week.  Do not use any products that contain nicotine or tobacco, such as cigarettes, e-cigarettes, and chewing tobacco. If you need help quitting, ask your health care provider.  If you are sexually active, practice safe sex. Use a condom or other form of protection in order to prevent STIs (sexually transmitted infections).  Talk with your health care provider about taking a low-dose aspirin or statin. What's next?  Go to your health care provider once a year for a well check visit.  Ask your health care provider how often you should have your eyes and teeth checked.  Stay up to date on all vaccines. This information is not intended to replace advice given to you by your health care provider. Make sure you discuss any questions you have with your health care provider. Document Revised: 02/14/2018 Document Reviewed: 02/14/2018 Elsevier Patient Education  2020 [employer].

## 2019-11-18 NOTE — Assessment & Plan Note (Signed)
Well controlled Continue current medications Recheck metabolic panel F/u in 6 months  

## 2019-11-18 NOTE — Assessment & Plan Note (Signed)
Followed by [facility] Advised to get COVID 19 vaccine dose #3 for immunocompromised individuals

## 2019-11-18 NOTE — Assessment & Plan Note (Signed)
Previously well controlled Not currently on a statin.  Continue to work on lifestyle modifications. Repeat FLP and CMP Goal LDL < 100

## 2019-11-19 ENCOUNTER — Encounter: Payer: Self-pay | Admitting: Family Medicine

## 2019-11-19 LAB — COMPREHENSIVE METABOLIC PANEL
ALT: 12 IU/L (ref 0–32)
AST: 18 IU/L (ref 0–40)
Albumin/Globulin Ratio: 1.1 — ABNORMAL LOW (ref 1.2–2.2)
Albumin: 3.6 g/dL — ABNORMAL LOW (ref 3.7–4.7)
Alkaline Phosphatase: 79 IU/L (ref 44–121)
BUN/Creatinine Ratio: 13 (ref 12–28)
BUN: 11 mg/dL (ref 8–27)
Bilirubin Total: 0.5 mg/dL (ref 0.0–1.2)
CO2: 25 mmol/L (ref 20–29)
Calcium: 9.5 mg/dL (ref 8.7–10.3)
Chloride: 102 mmol/L (ref 96–106)
Creatinine, Ser: 0.84 mg/dL (ref 0.57–1.00)
GFR calc Af Amer: 77 mL/min/{1.73_m2} (ref >59)
GFR calc non Af Amer: 67 mL/min/{1.73_m2} (ref >59)
Globulin, Total: 3.3 g/dL (ref 1.5–4.5)
Glucose: 86 mg/dL (ref 65–99)
Potassium: 3.9 mmol/L (ref 3.5–5.2)
Sodium: 141 mmol/L (ref 134–144)
Total Protein: 6.9 g/dL (ref 6.0–8.5)

## 2019-11-19 LAB — LIPID PANEL
Chol/HDL Ratio: 4.5 ratio — ABNORMAL HIGH (ref 0.0–4.4)
Cholesterol, Total: 148 mg/dL (ref 100–199)
HDL: 33 mg/dL — ABNORMAL LOW (ref >39)
LDL Chol Calc (NIH): 99 mg/dL (ref 0–99)
Triglycerides: 85 mg/dL (ref 0–149)
VLDL Cholesterol Cal: 16 mg/dL (ref 5–40)

## 2019-11-19 LAB — HEMOGLOBIN A1C
Est. average glucose Bld gHb Est-mCnc: 128 mg/dL
Hgb A1c MFr Bld: 6.1 % — ABNORMAL HIGH (ref 4.8–5.6)

## 2019-11-19 LAB — TSH: TSH: 2.99 u[IU]/mL (ref 0.450–4.500)

## 2019-11-21 ENCOUNTER — Telehealth: Payer: Self-pay

## 2019-11-21 NOTE — Telephone Encounter (Signed)
Pt advised.  She wanted to know if Orencia could raise her A1C?  Please advised  Thanks,   -Mickel Baas

## 2019-11-21 NOTE — Telephone Encounter (Signed)
I replied to her mychart message. Orencia should not change her A1c

## 2019-11-21 NOTE — Telephone Encounter (Signed)
-----   Message from Virginia Crews, MD sent at 11/21/2019 10:07 AM EDT ----- Normal labs, except Hemoglobin A1c, 3 month avg of blood sugars, is in prediabetic range.  In order to prevent progression to diabetes, recommend low carb diet and regular exercise

## 2019-12-01 DIAGNOSIS — M0579 Rheumatoid arthritis with rheumatoid factor of multiple sites without organ or systems involvement: Secondary | ICD-10-CM | POA: Diagnosis not present

## 2019-12-17 ENCOUNTER — Other Ambulatory Visit: Payer: Self-pay | Admitting: Family Medicine

## 2019-12-17 NOTE — Telephone Encounter (Signed)
Requested medication (s) are due for refill today: Yes  Requested medication (s) are on the active medication list: Yes  Last refill:  12/06/18  Future visit scheduled: Yes  Notes to clinic:  Prescription has expired.    Requested Prescriptions  Pending Prescriptions Disp Refills   omeprazole (PRILOSEC) 20 MG capsule [Pharmacy Med Name: Omeprazole 20 MG Oral Capsule Delayed Release] 180 capsule 3    Sig: TAKE 1 CAPSULE BY MOUTH 2  TIMES DAILY BEFORE MEALS      Gastroenterology: Proton Pump Inhibitors Passed - 12/17/2019  2:32 PM      Passed - Valid encounter within last 12 months    Recent Outpatient Visits           4 weeks ago Essential hypertension   [place], Dionne Bucy, MD   7 months ago Essential hypertension   [place], Dionne Bucy, MD   10 months ago Acute cystitis without hematuria   [facility], Dionne Bucy, MD   10 months ago [place], Dionne Bucy, MD   1 year ago [place], Dionne Bucy, MD       Future Appointments             In 5 months [place], Dionne Bucy, MD [facility], [place]

## 2019-12-22 ENCOUNTER — Encounter: Payer: Self-pay | Admitting: Family Medicine

## 2020-01-02 ENCOUNTER — Telehealth: Payer: Self-pay

## 2020-01-02 NOTE — Telephone Encounter (Signed)
Copied from [place] [id number]. Topic: General - Inquiry >> Jan 02, 2020 11:11 AM Gillis Ends D wrote: Reason for CRM: Patient called because she wants to be set up to get the moderna booster and she also wants to know if you have an xarelto samples. She can be reached at [phone].Please advise

## 2020-01-05 ENCOUNTER — Encounter: Payer: Self-pay | Admitting: Family Medicine

## 2020-01-05 ENCOUNTER — Telehealth: Payer: Self-pay

## 2020-01-05 ENCOUNTER — Ambulatory Visit: Payer: Medicare Other | Admitting: Physical Therapy

## 2020-01-05 ENCOUNTER — Ambulatory Visit: Payer: Medicare Other | Admitting: Family Medicine

## 2020-01-05 NOTE — Telephone Encounter (Signed)
FYI

## 2020-01-05 NOTE — Telephone Encounter (Signed)
Copied from [place] [id number]. Topic: General - Other >> Jan 05, 2020  1:49 PM Keene Breath wrote: Reason for CRM: Patient called to cancel her appt. For today because on the way to the appt. She stopped to help a sick neighbor.  Please stated she will call back to reschedule

## 2020-01-06 DIAGNOSIS — M0579 Rheumatoid arthritis with rheumatoid factor of multiple sites without organ or systems involvement: Secondary | ICD-10-CM | POA: Diagnosis not present

## 2020-01-06 DIAGNOSIS — R21 Rash and other nonspecific skin eruption: Secondary | ICD-10-CM | POA: Diagnosis not present

## 2020-01-06 NOTE — Telephone Encounter (Signed)
Noted  

## 2020-01-06 NOTE — Telephone Encounter (Signed)
Maybe we can find something available for her?

## 2020-01-07 ENCOUNTER — Ambulatory Visit: Payer: Medicare Other | Admitting: Physical Therapy

## 2020-01-08 NOTE — Telephone Encounter (Signed)
Glad she was seen and is doing better. We can give samples if we have them

## 2020-01-12 ENCOUNTER — Ambulatory Visit: Payer: Medicare Other | Attending: Neurology | Admitting: Physical Therapy

## 2020-01-12 ENCOUNTER — Encounter: Payer: Self-pay | Admitting: Physical Therapy

## 2020-01-12 ENCOUNTER — Other Ambulatory Visit: Payer: Self-pay

## 2020-01-12 DIAGNOSIS — R2689 Other abnormalities of gait and mobility: Secondary | ICD-10-CM | POA: Insufficient documentation

## 2020-01-12 DIAGNOSIS — Z9181 History of falling: Secondary | ICD-10-CM

## 2020-01-12 DIAGNOSIS — M6281 Muscle weakness (generalized): Secondary | ICD-10-CM | POA: Diagnosis not present

## 2020-01-12 NOTE — Therapy (Signed)
[facility] [address] [place], [place], [address] Phone: [phone]   Fax:  [phone]  Physical Therapy Evaluation  Patient Details  Name: Glenda Williams MRN: [id number] Date of Birth: 04-03-1940 Referring Provider (PT): Vladimir Crofts, MD   Encounter Date: 01/12/2020   PT End of Session - 01/12/20 1047    Visit Number 1    Number of Visits 9    Date for PT Re-Evaluation 02/09/20    Authorization - Visit Number 1    Authorization - Number of Visits 10    PT Start Time 0903    PT Stop Time 0952    PT Time Calculation (min) 49 min    Equipment Utilized During Treatment Gait belt    Activity Tolerance Patient tolerated treatment well    Behavior During Therapy [facility] for tasks assessed/performed           Past Medical History:  Diagnosis Date  . A-fib ([place])   . Arthritis   . Bell palsy 09/04/2014  . GERD (gastroesophageal reflux disease)   . Heart disease   . Rheumatoid arthritis ([place])   . Thyroid disease   . Tremors of nervous system     Past Surgical History:  Procedure Laterality Date  . BREAST BIOPSY Left 1988   Benign  . BREAST BIOPSY Left 1987   Benign  . BREAST EXCISIONAL BIOPSY    . BROW LIFT Bilateral 05/23/2016   Procedure: BLEPHAROPLASTY upper eyelid; w/excess skin;  Surgeon: Karle Starch, MD;  Location: [place];  Service: Ophthalmology;  Laterality: Bilateral;  . CARPAL TUNNEL RELEASE Bilateral   . CHOLECYSTECTOMY     Dr. Bary Castilla  . COLONOSCOPY  2006  . JOINT REPLACEMENT Bilateral    knees  . REPLACEMENT TOTAL KNEE Left 2005  . REPLACEMENT TOTAL KNEE Right 1998    There were no vitals filed for this visit.    Subjective Assessment - 01/12/20 0904    Subjective Pt is a pleasant 79 y.o. female referred to PT for parkinsonisms and imbalance. Pt has a PMH of Afib, RA, valvular disease, HTN, hypercholesterolemia, and obesity. Pt reports balance difficulties in the past 6-8 months with 2  falls falling in her office at her home tripping over a rug falling into the corner of her desk. Pt feels most off balance when she is tired or stressed she begins feeling unsteady. Pt denies dizziness or lightheadedness. Pt believes she has better balance without shoes on but has new SAS shoes that have been helpful for her. Pt displays difficulty walking on unstable surfaces like grass and pt has difficulty with stairs. Pt lives in a [facility] with flat entrance and accessible shower and elevated toilet with 42" doorways with wide hallways and lives with her husband. Pt's goal with PT is to improve her walking abilities and endurance.    Limitations House hold activities;Lifting;Walking    How long can you stand comfortably? 1 hour    How long can you walk comfortably? 15-20 min depending on walking surface.    Patient Stated Goals Improve walking endurance.    Currently in Pain? No/denies              [facility] PT Assessment - 01/12/20 0001      Assessment   Medical Diagnosis Imbalance    Referring Provider (PT) Vladimir Crofts, MD    Onset Date/Surgical Date 03/07/19    Prior Therapy Yes  Precautions   Precautions Fall      Balance Screen   Has the patient fallen in the past 6 months No    Has the patient had a decrease in activity level because of a fear of falling?  Yes    Is the patient reluctant to leave their home because of a fear of falling?  No      Home Environment   Living Environment Private residence    Living Arrangements Spouse/significant other    Type of [place] Access Level entry    [place] - single point;Toilet riser      Prior Function   Level of Independence Independent    Vocation Retired      Charity fundraiser Status Within Functional Limits for tasks assessed          OBJECTIVE  MUSCULOSKELETAL: Tremor: Resting tremor at baseline Bulk: Normal Tone: Normal, no clonus  Posture Slumped  posture in sitting and kyphotic posture in standing. Increased WB'ing on RLE and SPC to offload LLE.  Gait Bilateral trendelenburg with SPC with decreased step lengths bilaterally. Antaltigc gait on LLE with no SPC ambulation.  Strength R/L 4/4- Hip flexion 4/4 Hip abduction (seated) 4/4 Hip adduction 4+/4 Knee extension 4/4 Knee flexion 4/4 Ankle Plantarflexion 4/4 Ankle Dorsiflexion 4/4 Ankle Inversion 4/4 Ankle eversion   NEUROLOGICAL: Mental Status Patient's fund of knowledge is within normal limits for educational level.   Sensation Grossly intact to light touch bilateral UEs/LEs as determined by testing dermatomes C2-T2/L2-S2 respectively Proprioception and hot/cold testing deferred on this date   FUNCTIONAL OUTCOME MEASURES   Results Comments  BERG 45/56 Fall risk, in need of intervention  DGI /24 Deferred to next session  TUG 14.66 seconds In need of intervention  30 sec STS 6 reps no UE support In need of intervention                   Objective measurements completed on examination: See above findings.     PT Education - 01/12/20 1046    Education Details Form/technique with exercise.    Person(s) Educated Patient    Methods Explanation;Demonstration;Tactile cues;Verbal cues;Handout    Comprehension Verbalized understanding;Returned demonstration               PT Long Term Goals - 01/12/20 1111      PT LONG TERM GOAL #1   Title Pt will improve FOTO score to 60 to display significant improvements in functional mobility.    Baseline 11/8: 48/60    Time 4    Period Weeks    Status New    Target Date 02/09/20      PT LONG TERM GOAL #2   Title Pt will improve 30 sec STS to 13 reps to match appropriate age ranges for clinically significant LE strength for comunity level ADL's    Baseline 11/8: 6 reps, no UE support    Time 4    Period Weeks    Status New    Target Date 02/09/20      PT LONG TERM GOAL #3   Title Pt will improve TUG with  SPC to < 12 sec to display decreased risk of falls.    Baseline 11/8: 14. 66 sec    Time 4    Period Weeks    Status New    Target Date 02/09/20      PT  LONG TERM GOAL #4   Title Pt will improve Berg to > 52/56 to display significant decrease in falls risk with home and community tasks.    Baseline 11/8: 45/56    Time 4    Period Weeks    Status New    Target Date 02/09/20      PT LONG TERM GOAL #5   Title Pt will asc/desc full flight of stairs with SPC and single handrail and reciprocal pattern with < 2/10 pain via NPS to improve community level tasks.    Baseline 11/8: Asc/desc 4 stairs with reciprocal pattern, SPC, and handrail with up to 5/10 NPS and reports of difficulty. Good safety.    Time 4    Period Weeks    Status New    Target Date 02/09/20               Plan - 01/12/20 1059    Clinical Impression Statement Pt is a pleasant 79 y.o female referred to PT for parkinsonism and imbalance. Pt has a history of falls and amb with a SPC. Pt has generalized overall LE muscle weakness at 4/5 MMT and a 30 sec STS time of 6 reps indicative of decreased BLE strength. Pt amb with SPC with B trendelenburg and decreased step lengths. Without SPC pt has R lat lean and increased trendelenburg on R without UE support of SPC. Pt scored 45/56 on Berg balance and 14.66 sec on TUG with SPC indicative of pt being at increased risk of falls. With stairs pt has 5/10 pain in LLE with SPC and reciprocal pattern asc/desc stairs. No safety concerns, jsut reports of pain and difficulty. Pt is limited in community level ADL's and tasks increasing her risk for falls and can benefit from skilled PT treatment to improve these impairments and reduce risk of falls.    Personal Factors and Comorbidities Age;Comorbidity 3+;Past/Current Experience    Examination-Activity Limitations Stand;Locomotion Level;Squat;Stairs    Examination-Participation Restrictions Community Activity    Stability/Clinical Decision  Making Evolving/Moderate complexity    Clinical Decision Making Moderate    Rehab Potential Good    PT Frequency 2x / week    PT Duration 4 weeks    PT Treatment/Interventions ADLs/Self Care Home Management;DME Instruction;Gait training;Stair training;Functional mobility training;Therapeutic activities;Therapeutic exercise;Balance training;Neuromuscular re-education;Patient/family education;Energy conservation    PT Next Visit Plan Reassess HEP, perform DGI.    PT Home Exercise Plan Access Code: [email or web address]           Patient will benefit from skilled therapeutic intervention in order to improve the following deficits and impairments:  Abnormal gait, Pain, Improper body mechanics, Decreased mobility, Postural dysfunction, Decreased activity tolerance, Decreased endurance, Decreased strength, Decreased balance, Difficulty walking  Visit Diagnosis: Muscle weakness (generalized)  At risk for falls  Other abnormalities of gait and mobility     Problem List Patient Active Problem List   Diagnosis Date Noted  . Current use of long term anticoagulation 05/07/2019  . Dizziness 05/07/2019  . Hypertension 04/26/2015  . Dry mouth 04/26/2015  . Morbid obesity ([place]) 09/04/2014  . Hypokalemia 08/12/2014  . Allergic rhinitis 08/04/2014  . Anxiety 08/04/2014  . Cannot sleep 08/04/2014  . Neuropathic pain 08/04/2014  . Atrial fibrillation ([place]) 08/04/2014  . Avitaminosis D 08/04/2014  . Gastroesophageal reflux disease without esophagitis 11/07/2013  . Polypharmacy 08/19/2013  . Osteoarthritis 06/11/2013  . Rheumatoid arthritis ([place]) 06/11/2013  . Hypercholesteremia 05/12/2009  . Benign essential tremor 05/12/2009  . Prediabetes 08/26/2008  . Adult hypothyroidism  08/26/2008  . Menopausal symptom 08/26/2008   Pura Spice, PT, DPT # [id number], SPT 01/12/2020, 12:22 PM  [facility] [address] [place], [place],  [address] Phone: [phone]   Fax:  [phone]  Name: Glenda Williams MRN: [id number] Date of Birth: 1941-01-27

## 2020-01-14 ENCOUNTER — Other Ambulatory Visit: Payer: Self-pay

## 2020-01-14 ENCOUNTER — Encounter: Payer: Self-pay | Admitting: Physical Therapy

## 2020-01-14 ENCOUNTER — Ambulatory Visit: Payer: Medicare Other | Admitting: Physical Therapy

## 2020-01-14 DIAGNOSIS — Z9181 History of falling: Secondary | ICD-10-CM | POA: Diagnosis not present

## 2020-01-14 DIAGNOSIS — M6281 Muscle weakness (generalized): Secondary | ICD-10-CM

## 2020-01-14 DIAGNOSIS — R2689 Other abnormalities of gait and mobility: Secondary | ICD-10-CM

## 2020-01-14 NOTE — Therapy (Signed)
[facility] [address] [place], [place], [address] Phone: [phone]   Fax:  [phone]  Physical Therapy Treatment  Patient Details  Name: Glenda Williams MRN: [id number] Date of Birth: 1940/05/31 Referring Provider (PT): Vladimir Crofts, MD   Encounter Date: 01/14/2020   PT End of Session - 01/14/20 0943    Visit Number 2    Number of Visits 9    Date for PT Re-Evaluation 02/09/20    Authorization - Visit Number 2    Authorization - Number of Visits 10    PT Start Time 0852    PT Stop Time 0939    PT Time Calculation (min) 47 min    Equipment Utilized During Treatment Gait belt    Activity Tolerance Patient tolerated treatment well    Behavior During Therapy [facility] for tasks assessed/performed           Past Medical History:  Diagnosis Date  . A-fib ([place])   . Arthritis   . Bell palsy 09/04/2014  . GERD (gastroesophageal reflux disease)   . Heart disease   . Rheumatoid arthritis ([place])   . Thyroid disease   . Tremors of nervous system     Past Surgical History:  Procedure Laterality Date  . BREAST BIOPSY Left 1988   Benign  . BREAST BIOPSY Left 1987   Benign  . BREAST EXCISIONAL BIOPSY    . BROW LIFT Bilateral 05/23/2016   Procedure: BLEPHAROPLASTY upper eyelid; w/excess skin;  Surgeon: Karle Starch, MD;  Location: [place];  Service: Ophthalmology;  Laterality: Bilateral;  . CARPAL TUNNEL RELEASE Bilateral   . CHOLECYSTECTOMY     Dr. Bary Castilla  . COLONOSCOPY  2006  . JOINT REPLACEMENT Bilateral    knees  . REPLACEMENT TOTAL KNEE Left 2005  . REPLACEMENT TOTAL KNEE Right 1998    There were no vitals filed for this visit.   Subjective Assessment - 01/14/20 0941    Subjective Pt reports practicing "a couple" of her HEP exercises. Soreness in low back from RA. No falls.    Limitations House hold activities;Lifting;Walking    How long can you stand comfortably? 1 hour    How long can you walk  comfortably? 15-20 min depending on walking surface.    Patient Stated Goals Improve walking endurance.    Currently in Pain? No/denies            Vitals: HR and SPO2 monitored throughout session due to hx of A-fib. Normal readings throughout session. Occasional jumping in HR from mid 60's BPM to mid 70's. SPO2 maintained at 98%.  Neuro Re-Ed:   Performed DGI scoring a 10/24 without SPC. Unable to perform stepping over box at all without SPC.   Reassessed pt's HEP. Performed 2x10 of each exercise listed below with use of mirror for form/technique. Mod verbal and tactile cues for form with good carryover after cues.  Standing B hip abduction   Standing marches   STS with no UE support   SLS: able to hold for 10 sec bilaterally with lat sway   Tandem stance: able to hold varying from 20-30 sec with L foot in front and R foot in front.   SPC walking stepping over 2 small hurdles and 1/2 bolster. Mod verbal cues for cane sequencing and equal step lengths. x4 with CGA +1     PT Education - 01/14/20 0942    Education Details form/technique with exercise.    Person(s)  Educated Patient    Methods Explanation;Demonstration;Tactile cues;Verbal cues    Comprehension Verbalized understanding;Returned demonstration               PT Long Term Goals - 01/14/20 0959      PT LONG TERM GOAL #1   Title Pt will improve FOTO score to 60 to display significant improvements in functional mobility.    Baseline 11/8: 48/60    Time 4    Period Weeks    Status New    Target Date 02/09/20      PT LONG TERM GOAL #2   Title Pt will improve 30 sec STS to 13 reps to match appropriate age ranges for clinically significant LE strength for comunity level ADL's    Baseline 11/8: 6 reps, no UE support    Time 4    Period Weeks    Status New    Target Date 02/09/20      PT LONG TERM GOAL #3   Title Pt will improve TUG with SPC to < 12 sec to display decreased risk of falls.    Baseline 11/8: 14. 66  sec    Time 4    Period Weeks    Status New    Target Date 02/09/20      PT LONG TERM GOAL #4   Title Pt will improve Berg to > 52/56 to display significant decrease in falls risk with home and community tasks.    Baseline 11/8: 45/56    Time 4    Period Weeks    Status New    Target Date 02/09/20      PT LONG TERM GOAL #5   Title Pt will asc/desc full flight of stairs with SPC and single handrail and reciprocal pattern with < 2/10 pain via NPS to improve community level tasks.    Baseline 11/8: Asc/desc 4 stairs with reciprocal pattern, SPC, and handrail with up to 5/10 NPS and reports of difficulty. Good safety.    Time 4    Period Weeks    Status New    Target Date 02/09/20      Additional Long Term Goals   Additional Long Term Goals Yes      PT LONG TERM GOAL #6   Title Pt will improve DGI with no AD to >15 to improve community ambulation tasks.    Baseline 11/10: 10/24    Time 4    Period Weeks    Status New    Target Date 02/09/20                 Plan - 01/14/20 7858    Clinical Impression Statement Pt perofrmed DGI without an AD for new goal for community amb. Pt scored a 10/24 putting pt at significant risk of falls with community tasks without an AD. Pt displayed HEP exercises and reviewed with therapist. INitially required mod verbal and tactile cues for improved form/technique with good carryover after cuing. Most difficulty with SLS. Pt wishes to improve SLS so she can safely don/doff pants standing up. Pt can continue to benefit from skilled PT treatment to improve functional mobility.    Personal Factors and Comorbidities Age;Comorbidity 3+;Past/Current Experience    Examination-Activity Limitations Stand;Locomotion Level;Squat;Stairs    Examination-Participation Restrictions Community Activity    Stability/Clinical Decision Making Evolving/Moderate complexity    Clinical Decision Making Moderate    Rehab Potential Good    PT Frequency 2x / week    PT  Duration 4 weeks  PT Treatment/Interventions ADLs/Self Care Home Management;DME Instruction;Gait training;Stair training;Functional mobility training;Therapeutic activities;Therapeutic exercise;Balance training;Neuromuscular re-education;Patient/family education;Energy conservation    PT Next Visit Plan Dynamic balance, stepping over obstacles.    PT Home Exercise Plan Access Code: [email or web address]           Patient will benefit from skilled therapeutic intervention in order to improve the following deficits and impairments:  Abnormal gait, Pain, Improper body mechanics, Decreased mobility, Postural dysfunction, Decreased activity tolerance, Decreased endurance, Decreased strength, Decreased balance, Difficulty walking  Visit Diagnosis: Muscle weakness (generalized)  At risk for falls  Other abnormalities of gait and mobility     Problem List Patient Active Problem List   Diagnosis Date Noted  . Current use of long term anticoagulation 05/07/2019  . Dizziness 05/07/2019  . Hypertension 04/26/2015  . Dry mouth 04/26/2015  . Morbid obesity ([place]) 09/04/2014  . Hypokalemia 08/12/2014  . Allergic rhinitis 08/04/2014  . Anxiety 08/04/2014  . Cannot sleep 08/04/2014  . Neuropathic pain 08/04/2014  . Atrial fibrillation ([place]) 08/04/2014  . Avitaminosis D 08/04/2014  . Gastroesophageal reflux disease without esophagitis 11/07/2013  . Polypharmacy 08/19/2013  . Osteoarthritis 06/11/2013  . Rheumatoid arthritis ([place]) 06/11/2013  . Hypercholesteremia 05/12/2009  . Benign essential tremor 05/12/2009  . Prediabetes 08/26/2008  . Adult hypothyroidism 08/26/2008  . Menopausal symptom 08/26/2008   Pura Spice, PT, DPT # [address], SPT 01/14/2020, 10:27 AM  [facility] [address] [place], [place], [address] Phone: [phone]   Fax:  [phone]  Name: Glenda Williams MRN: [id number] Date of Birth: 05-31-1940

## 2020-01-19 ENCOUNTER — Other Ambulatory Visit: Payer: Self-pay

## 2020-01-19 ENCOUNTER — Encounter: Payer: Self-pay | Admitting: Physical Therapy

## 2020-01-19 ENCOUNTER — Ambulatory Visit: Payer: Medicare Other | Admitting: Physical Therapy

## 2020-01-19 DIAGNOSIS — Z9181 History of falling: Secondary | ICD-10-CM

## 2020-01-19 DIAGNOSIS — M6281 Muscle weakness (generalized): Secondary | ICD-10-CM

## 2020-01-19 DIAGNOSIS — R2689 Other abnormalities of gait and mobility: Secondary | ICD-10-CM | POA: Diagnosis not present

## 2020-01-19 NOTE — Therapy (Signed)
[facility] [address] [place], [place], [address] Phone: [phone]   Fax:  [phone]  Physical Therapy Treatment  Patient Details  Name: Glenda Williams MRN: [id number] Date of Birth: 1940-11-16 Referring Provider (PT): Vladimir Crofts, MD   Encounter Date: 01/19/2020   PT End of Session - 01/19/20 0906    Visit Number 3    Number of Visits 9    Date for PT Re-Evaluation 02/09/20    Authorization - Visit Number 3    Authorization - Number of Visits 10    PT Start Time 7681    PT Stop Time 0943    PT Time Calculation (min) 45 min    Equipment Utilized During Treatment Gait belt    Activity Tolerance Patient tolerated treatment well    Behavior During Therapy [facility] for tasks assessed/performed           Past Medical History:  Diagnosis Date  . A-fib ([place])   . Arthritis   . Bell palsy 09/04/2014  . GERD (gastroesophageal reflux disease)   . Heart disease   . Rheumatoid arthritis ([place])   . Thyroid disease   . Tremors of nervous system     Past Surgical History:  Procedure Laterality Date  . BREAST BIOPSY Left 1988   Benign  . BREAST BIOPSY Left 1987   Benign  . BREAST EXCISIONAL BIOPSY    . BROW LIFT Bilateral 05/23/2016   Procedure: BLEPHAROPLASTY upper eyelid; w/excess skin;  Surgeon: Karle Starch, MD;  Location: [place];  Service: Ophthalmology;  Laterality: Bilateral;  . CARPAL TUNNEL RELEASE Bilateral   . CHOLECYSTECTOMY     Dr. Bary Castilla  . COLONOSCOPY  2006  . JOINT REPLACEMENT Bilateral    knees  . REPLACEMENT TOTAL KNEE Left 2005  . REPLACEMENT TOTAL KNEE Right 1998    There were no vitals filed for this visit.   Subjective Assessment - 01/19/20 0905    Subjective Pt reports RA soreness and is getting her next infusion tomorrow morning. After last PT session, pt reports muscular soreness in BLE's for 2 days. No falls.    Limitations House hold activities;Lifting;Walking    How long can  you stand comfortably? 1 hour    How long can you walk comfortably? 15-20 min depending on walking surface.    Patient Stated Goals Improve walking endurance.    Currently in Pain? No/denies           There.ex:   Nu-Step L2 for 10 min to reduce lumbar and LE soreness. Monitored HR during due to history of Afib. 10 min LE's only. SPO32: 97-98%, HR: 69-78 BPM maintaining SPM > 60.    Neuro Re-Ed:   Walking in // bars with no UE support or AD focusing on equal step lengths and heel strike: x8, SBA+1, intermittent RUE support. Cuing to not use RUE.  SLS in // bars: 3x30 sec/LE  Tandem walking in // bars: x6. Intermittent UE support with RUE.  Side Stepping in // bars no UE support: x8. CGA+1. Difficulty stepping to R > L with R hip drop when standing on LLE.   Alternating cone taps with LE's. RUE support: 2x10. SBA+1. Progressed to LUE support with RLE cone taps due to L glut med weakness. 1x6.   Intermittent standing and seated rest breaks between exercises. Vitals normal, SPO2: 98%, HR: staying in 65-78 BPM range.    PT Education - 01/19/20 1572  Education Details form/technique with exercise.    Person(s) Educated Patient    Methods Explanation;Demonstration;Tactile cues;Verbal cues    Comprehension Verbalized understanding;Returned demonstration               PT Long Term Goals - 01/14/20 0959      PT LONG TERM GOAL #1   Title Pt will improve FOTO score to 60 to display significant improvements in functional mobility.    Baseline 11/8: 48/60    Time 4    Period Weeks    Status New    Target Date 02/09/20      PT LONG TERM GOAL #2   Title Pt will improve 30 sec STS to 13 reps to match appropriate age ranges for clinically significant LE strength for comunity level ADL's    Baseline 11/8: 6 reps, no UE support    Time 4    Period Weeks    Status New    Target Date 02/09/20      PT LONG TERM GOAL #3   Title Pt will improve TUG with SPC to < 12 sec to display  decreased risk of falls.    Baseline 11/8: 14. 66 sec    Time 4    Period Weeks    Status New    Target Date 02/09/20      PT LONG TERM GOAL #4   Title Pt will improve Berg to > 52/56 to display significant decrease in falls risk with home and community tasks.    Baseline 11/8: 45/56    Time 4    Period Weeks    Status New    Target Date 02/09/20      PT LONG TERM GOAL #5   Title Pt will asc/desc full flight of stairs with SPC and single handrail and reciprocal pattern with < 2/10 pain via NPS to improve community level tasks.    Baseline 11/8: Asc/desc 4 stairs with reciprocal pattern, SPC, and handrail with up to 5/10 NPS and reports of difficulty. Good safety.    Time 4    Period Weeks    Status New    Target Date 02/09/20      Additional Long Term Goals   Additional Long Term Goals Yes      PT LONG TERM GOAL #6   Title Pt will improve DGI with no AD to >15 to improve community ambulation tasks.    Baseline 11/10: 10/24    Time 4    Period Weeks    Status New    Target Date 02/09/20                 Plan - 01/19/20 0950    Clinical Impression Statement Focus on today's session on gait and balance due to reports of significant LE soerness from previous session. Pt has most difficulty standing on LLE with LUE support with raising RLE onto cone due to L glut med weakness. Pt with significant R trendelenburg when walking in // bars with no UE support affecting pt's gait with step-to pattern. Pt can continue to benefit from skilled PT treatment to improve functional mobility and reduce risk of falls.    Personal Factors and Comorbidities Age;Comorbidity 3+;Past/Current Experience    Examination-Activity Limitations Stand;Locomotion Level;Squat;Stairs    Examination-Participation Restrictions Community Activity    Stability/Clinical Decision Making Evolving/Moderate complexity    Clinical Decision Making Moderate    Rehab Potential Good    PT Frequency 2x / week    PT  Duration 4 weeks    PT Treatment/Interventions ADLs/Self Care Home Management;DME Instruction;Gait training;Stair training;Functional mobility training;Therapeutic activities;Therapeutic exercise;Balance training;Neuromuscular re-education;Patient/family education;Energy conservation    PT Next Visit Plan Stepping over obstacles.    PT Home Exercise Plan Access Code: [email or web address]           Patient will benefit from skilled therapeutic intervention in order to improve the following deficits and impairments:  Abnormal gait, Pain, Improper body mechanics, Decreased mobility, Postural dysfunction, Decreased activity tolerance, Decreased endurance, Decreased strength, Decreased balance, Difficulty walking  Visit Diagnosis: Muscle weakness (generalized)  At risk for falls  Other abnormalities of gait and mobility     Problem List Patient Active Problem List   Diagnosis Date Noted  . Current use of long term anticoagulation 05/07/2019  . Dizziness 05/07/2019  . Hypertension 04/26/2015  . Dry mouth 04/26/2015  . Morbid obesity ([place]) 09/04/2014  . Hypokalemia 08/12/2014  . Allergic rhinitis 08/04/2014  . Anxiety 08/04/2014  . Cannot sleep 08/04/2014  . Neuropathic pain 08/04/2014  . Atrial fibrillation ([place]) 08/04/2014  . Avitaminosis D 08/04/2014  . Gastroesophageal reflux disease without esophagitis 11/07/2013  . Polypharmacy 08/19/2013  . Osteoarthritis 06/11/2013  . Rheumatoid arthritis ([place]) 06/11/2013  . Hypercholesteremia 05/12/2009  . Benign essential tremor 05/12/2009  . Prediabetes 08/26/2008  . Adult hypothyroidism 08/26/2008  . Menopausal symptom 08/26/2008   Pura Spice, PT, DPT # [address], SPT 01/19/2020, 1:35 PM  [facility] [address] [place], [place], [address] Phone: [phone]   Fax:  [phone]  Name: Glenda Williams MRN: [id number] Date of Birth: 05-22-1940

## 2020-01-20 DIAGNOSIS — M0579 Rheumatoid arthritis with rheumatoid factor of multiple sites without organ or systems involvement: Secondary | ICD-10-CM | POA: Diagnosis not present

## 2020-01-21 ENCOUNTER — Ambulatory Visit: Payer: Medicare Other | Admitting: Physical Therapy

## 2020-01-21 ENCOUNTER — Other Ambulatory Visit: Payer: Self-pay

## 2020-01-21 ENCOUNTER — Encounter: Payer: Self-pay | Admitting: Physical Therapy

## 2020-01-21 DIAGNOSIS — R2689 Other abnormalities of gait and mobility: Secondary | ICD-10-CM | POA: Diagnosis not present

## 2020-01-21 DIAGNOSIS — M6281 Muscle weakness (generalized): Secondary | ICD-10-CM | POA: Diagnosis not present

## 2020-01-21 DIAGNOSIS — Z9181 History of falling: Secondary | ICD-10-CM | POA: Diagnosis not present

## 2020-01-21 NOTE — Therapy (Signed)
[facility] [address] [place], [place], [address] Phone: [phone]   Fax:  [phone]  Physical Therapy Treatment  Patient Details  Name: Glenda Williams MRN: [id number] Date of Birth: 1940/10/05 Referring Provider (PT): Vladimir Crofts, MD   Encounter Date: 01/21/2020   PT End of Session - 01/21/20 0918    Visit Number 4    Number of Visits 9    Date for PT Re-Evaluation 02/09/20    Authorization - Visit Number 4    Authorization - Number of Visits 10    PT Start Time 0906    PT Stop Time 0951    PT Time Calculation (min) 45 min    Equipment Utilized During Treatment Gait belt    Activity Tolerance Patient tolerated treatment well    Behavior During Therapy [facility] for tasks assessed/performed           Past Medical History:  Diagnosis Date  . A-fib ([place])   . Arthritis   . Bell palsy 09/04/2014  . GERD (gastroesophageal reflux disease)   . Heart disease   . Rheumatoid arthritis ([place])   . Thyroid disease   . Tremors of nervous system     Past Surgical History:  Procedure Laterality Date  . BREAST BIOPSY Left 1988   Benign  . BREAST BIOPSY Left 1987   Benign  . BREAST EXCISIONAL BIOPSY    . BROW LIFT Bilateral 05/23/2016   Procedure: BLEPHAROPLASTY upper eyelid; w/excess skin;  Surgeon: Karle Starch, MD;  Location: [place];  Service: Ophthalmology;  Laterality: Bilateral;  . CARPAL TUNNEL RELEASE Bilateral   . CHOLECYSTECTOMY     Dr. Bary Castilla  . COLONOSCOPY  2006  . JOINT REPLACEMENT Bilateral    knees  . REPLACEMENT TOTAL KNEE Left 2005  . REPLACEMENT TOTAL KNEE Right 1998    There were no vitals filed for this visit.   Subjective Assessment - 01/21/20 0916    Subjective Pt reports not sleeping well last night and has R shoulder pain from overusing her RUE. No falls reported, been compliant with HEP.    Limitations House hold activities;Lifting;Walking    How long can you stand comfortably? 1  hour    How long can you walk comfortably? 15-20 min depending on walking surface.    Patient Stated Goals Improve walking endurance.    Currently in Pain? Yes    Pain Location Shoulder    Pain Orientation Right    Pain Descriptors / Indicators Aching;Discomfort;Dull    Pain Type Acute pain    Pain Frequency Intermittent           There.ex:   Nu-Step L2 for 12 min. LE's only. Monitoring HR and SPO2 throughout. Use of nu-step to improve msuculare and cardiopulmonary endurance for improved household and community level ambulation. 12 min. HR: 58-68 BPM, SPO2: 98%.  Resisted side-stepping with Yellow TB with BUE support: x8. Good form/technique.    Neuro Re-Ed:   Walking in // bars with LUE support: x6. Focus on improving R hip drop. Use of mirrors for upright posture. Verbal cues for wider BOS, equal step lengths, and heel strike bilat. CGA+1  Walking along floor ladder with SPC: x20. With focus on gait speed, equal step lengths, and reciprocal arm swing. Mod verbal and tactile cuing on LUE to improve reciprocal arm swing with fair carryover once verbal and tactile cuing are removed. Most difficulty incorporating L reciproal arm swing. Good carryover with  consistent gait speed, SPC sequencing, and redcued R hip drop with gait due to L glut med weakness. CGA+1 throughout.  Monitoring of vitals throughout exercises. HR after gait mechanics: 85-88 BPM and SPO2: 97-98%. Normal, linear decrease in HR after seated rest.      PT Education - 01/21/20 0917    Education Details form/technique with exercise.    Person(s) Educated Patient    Methods Explanation;Demonstration;Tactile cues;Verbal cues    Comprehension Verbalized understanding;Returned demonstration               PT Long Term Goals - 01/14/20 0959      PT LONG TERM GOAL #1   Title Pt will improve FOTO score to 60 to display significant improvements in functional mobility.    Baseline 11/8: 48/60    Time 4    Period Weeks     Status New    Target Date 02/09/20      PT LONG TERM GOAL #2   Title Pt will improve 30 sec STS to 13 reps to match appropriate age ranges for clinically significant LE strength for comunity level ADL's    Baseline 11/8: 6 reps, no UE support    Time 4    Period Weeks    Status New    Target Date 02/09/20      PT LONG TERM GOAL #3   Title Pt will improve TUG with SPC to < 12 sec to display decreased risk of falls.    Baseline 11/8: 14. 66 sec    Time 4    Period Weeks    Status New    Target Date 02/09/20      PT LONG TERM GOAL #4   Title Pt will improve Berg to > 52/56 to display significant decrease in falls risk with home and community tasks.    Baseline 11/8: 45/56    Time 4    Period Weeks    Status New    Target Date 02/09/20      PT LONG TERM GOAL #5   Title Pt will asc/desc full flight of stairs with SPC and single handrail and reciprocal pattern with < 2/10 pain via NPS to improve community level tasks.    Baseline 11/8: Asc/desc 4 stairs with reciprocal pattern, SPC, and handrail with up to 5/10 NPS and reports of difficulty. Good safety.    Time 4    Period Weeks    Status New    Target Date 02/09/20      Additional Long Term Goals   Additional Long Term Goals Yes      PT LONG TERM GOAL #6   Title Pt will improve DGI with no AD to >15 to improve community ambulation tasks.    Baseline 11/10: 10/24    Time 4    Period Weeks    Status New    Target Date 02/09/20                 Plan - 01/21/20 1104    Clinical Impression Statement Despite R shoulder pain, pt displayed improved ability to amb with significantly less R hip drop from L glut med weakness with improved gait speed with equal step lengths and sequencing with SPC and upright posture. Pt still guarded with walking on LUE and needs further therapy practice to improve reciprocal arm swing with giat due to fair carryover with tactile and verbal cuing. Updated pt HEP with resisted yellow TB  side stepping and educated on safe  techniques to dondoff band with chair and use of counter top support for balance to redcue risk of falls. Pt can continue to benefit from skileld PT to imrpove BLE strength/endurance, gait, and balance to reduce risk of falls and improve pt's functional mobility.    Personal Factors and Comorbidities Age;Comorbidity 3+;Past/Current Experience    Examination-Activity Limitations Stand;Locomotion Level;Squat;Stairs    Examination-Participation Restrictions Community Activity    Stability/Clinical Decision Making Evolving/Moderate complexity    Clinical Decision Making Moderate    Rehab Potential Good    PT Frequency 2x / week    PT Duration 4 weeks    PT Treatment/Interventions ADLs/Self Care Home Management;DME Instruction;Gait training;Stair training;Functional mobility training;Therapeutic activities;Therapeutic exercise;Balance training;Neuromuscular re-education;Patient/family education;Energy conservation    PT Next Visit Plan Improving trendelenburg during gait and reciprocal arm swing.    PT Home Exercise Plan Access Code: [email or web address]    Consulted and Agree with Plan of Care Patient           Patient will benefit from skilled therapeutic intervention in order to improve the following deficits and impairments:  Abnormal gait, Pain, Improper body mechanics, Decreased mobility, Postural dysfunction, Decreased activity tolerance, Decreased endurance, Decreased strength, Decreased balance, Difficulty walking  Visit Diagnosis: Muscle weakness (generalized)  At risk for falls  Other abnormalities of gait and mobility     Problem List Patient Active Problem List   Diagnosis Date Noted  . Current use of long term anticoagulation 05/07/2019  . Dizziness 05/07/2019  . Hypertension 04/26/2015  . Dry mouth 04/26/2015  . Morbid obesity ([place]) 09/04/2014  . Hypokalemia 08/12/2014  . Allergic rhinitis 08/04/2014  . Anxiety 08/04/2014  . Cannot sleep  08/04/2014  . Neuropathic pain 08/04/2014  . Atrial fibrillation ([place]) 08/04/2014  . Avitaminosis D 08/04/2014  . Gastroesophageal reflux disease without esophagitis 11/07/2013  . Polypharmacy 08/19/2013  . Osteoarthritis 06/11/2013  . Rheumatoid arthritis ([place]) 06/11/2013  . Hypercholesteremia 05/12/2009  . Benign essential tremor 05/12/2009  . Prediabetes 08/26/2008  . Adult hypothyroidism 08/26/2008  . Menopausal symptom 08/26/2008   Pura Spice, PT, DPT # [address], SPT 01/21/2020, 1:27 PM  [place] [facility] [address] [place], [place], [address] Phone: [phone]   Fax:  [phone]  Name: Glenda Williams MRN: [id number] Date of Birth: [birth year]

## 2020-01-26 ENCOUNTER — Ambulatory Visit: Payer: Medicare Other

## 2020-01-26 ENCOUNTER — Other Ambulatory Visit: Payer: Self-pay

## 2020-01-26 DIAGNOSIS — M6281 Muscle weakness (generalized): Secondary | ICD-10-CM | POA: Diagnosis not present

## 2020-01-26 DIAGNOSIS — Z9181 History of falling: Secondary | ICD-10-CM

## 2020-01-26 DIAGNOSIS — R2689 Other abnormalities of gait and mobility: Secondary | ICD-10-CM | POA: Diagnosis not present

## 2020-01-26 NOTE — Therapy (Signed)
[facility] [address]. [place], [place], [address] Phone: [phone]   Fax:  [phone]  Physical Therapy Treatment  Patient Details  Name: Glenda Williams MRN: [id number] Date of Birth: November 19, 1940 Referring Provider (PT): Vladimir Crofts, MD   Encounter Date: 01/26/2020   PT End of Session - 01/26/20 0842    Visit Number 5    Number of Visits 9    Date for PT Re-Evaluation 02/09/20    Authorization - Visit Number 5    Authorization - Number of Visits 10    PT Start Time 0845    PT Stop Time 0930    PT Time Calculation (min) 45 min    Equipment Utilized During Treatment Gait belt    Activity Tolerance Patient tolerated treatment well    Behavior During Therapy [facility] for tasks assessed/performed           Past Medical History:  Diagnosis Date  . A-fib ([place])   . Arthritis   . Bell palsy 09/04/2014  . GERD (gastroesophageal reflux disease)   . Heart disease   . Rheumatoid arthritis ([place])   . Thyroid disease   . Tremors of nervous system     Past Surgical History:  Procedure Laterality Date  . BREAST BIOPSY Left 1988   Benign  . BREAST BIOPSY Left 1987   Benign  . BREAST EXCISIONAL BIOPSY    . BROW LIFT Bilateral 05/23/2016   Procedure: BLEPHAROPLASTY upper eyelid; w/excess skin;  Surgeon: Karle Starch, MD;  Location: [facility];  Service: Ophthalmology;  Laterality: Bilateral;  . CARPAL TUNNEL RELEASE Bilateral   . CHOLECYSTECTOMY     Dr. Bary Castilla  . COLONOSCOPY  2006  . JOINT REPLACEMENT Bilateral    knees  . REPLACEMENT TOTAL KNEE Left 2005  . REPLACEMENT TOTAL KNEE Right 1998    There were no vitals filed for this visit.   Subjective Assessment - 01/26/20 0842    Subjective Pt continues to report R shoulder soreness today. Rates the pain as a 2-3/10 upon arrival. Pt complains of stiffness upon waking this morning. No falls reported since last therapy session. No specific questions or concerns  currently.    Limitations House hold activities;Lifting;Walking    How long can you stand comfortably? 1 hour    How long can you walk comfortably? 15-20 min depending on walking surface.    Patient Stated Goals Improve walking endurance.    Currently in Pain? Yes    Pain Score 3     Pain Location Shoulder    Pain Orientation Right    Pain Descriptors / Indicators Sore    Pain Type Chronic pain    Pain Onset More than a month ago    Pain Frequency Intermittent                 TREATMENT   Ther-ex  Nu-Step L2 for 5 min LE's only during history for warm-up, 3 minutes unbilled; Seated exercises with 3# ankle weights: Marches x 15 BLE; LAQ x 15 BLE; Seated clams with manual resistance x 15 BLE; Seated adductor squeezes with manual resistance x 15 BLE;  Standing exercises with 3# ankle weights: Marches x 15 BLE; Abduction x 15 BLE;  Sit to stand from regular height chair with Airex pad on seat without UE support x 10, Airex pad removed and performed an additional 4 reps, pt unable to complete more than 4 reps at this time;  Neuromuscular Re-education All exercises performed without UE support in // bars unless otherwise specified: Airex pad NBOS eyes open/closed x 30s each; Airex pad NBOS horizontal and vertical head turns x 30s each; Airex alternating 6" step taps with faded UE support 2-1 x 10 each, pt unable to let go entirely; Firm alternating 6" step taps with faded UE support 2-0, pt only able to perform a bouts reps without either  6" hurdle steps forward/backward with faded UE support 2-1, pt unable to perform without at least one hand support x 10 BLE;    Pt educated throughout session about proper posture and technique with exercises. Improved exercise technique, movement at target joints, use of target muscles after min to mod verbal, visual, tactile cues.     Patient demonstrates excellent motivation during session today. Incorporated additional  strengthening exercises with patient today including ankle weight exercises in both sitting and standing. Patient is able to perform sit to stands today with therapist however she struggles from regular height chair without upper extremity support. Patient encouraged to continue this exercise at home. She struggles with single-leg stance balance especially on the left side due to right hip drop with left abductor weakness. Patient encouraged to continue her HEP and follow-up as scheduled. Pt will benefit from PT services to address deficits in strength, balance, and mobility in order to return to full function at home.                             PT Long Term Goals - 01/14/20 0959      PT LONG TERM GOAL #1   Title Pt will improve FOTO score to 60 to display significant improvements in functional mobility.    Baseline 11/8: 48/60    Time 4    Period Weeks    Status New    Target Date 02/09/20      PT LONG TERM GOAL #2   Title Pt will improve 30 sec STS to 13 reps to match appropriate age ranges for clinically significant LE strength for comunity level ADL's    Baseline 11/8: 6 reps, no UE support    Time 4    Period Weeks    Status New    Target Date 02/09/20      PT LONG TERM GOAL #3   Title Pt will improve TUG with SPC to < 12 sec to display decreased risk of falls.    Baseline 11/8: 14. 66 sec    Time 4    Period Weeks    Status New    Target Date 02/09/20      PT LONG TERM GOAL #4   Title Pt will improve Berg to > 52/56 to display significant decrease in falls risk with home and community tasks.    Baseline 11/8: 45/56    Time 4    Period Weeks    Status New    Target Date 02/09/20      PT LONG TERM GOAL #5   Title Pt will asc/desc full flight of stairs with SPC and single handrail and reciprocal pattern with < 2/10 pain via NPS to improve community level tasks.    Baseline 11/8: Asc/desc 4 stairs with reciprocal pattern, SPC, and handrail with up to  5/10 NPS and reports of difficulty. Good safety.    Time 4    Period Weeks    Status New    Target Date 02/09/20  Additional Long Term Goals   Additional Long Term Goals Yes      PT LONG TERM GOAL #6   Title Pt will improve DGI with no AD to >15 to improve community ambulation tasks.    Baseline 11/10: 10/24    Time 4    Period Weeks    Status New    Target Date 02/09/20                 Plan - 01/26/20 0843    Clinical Impression Statement Patient demonstrates excellent motivation during session today. Incorporated additional strengthening exercises with patient today including ankle weight exercises in both sitting and standing. Patient is able to perform sit to stands today with therapist however she struggles from regular height chair without upper extremity support. Patient encouraged to continue this exercise at home. She struggles with single-leg stance balance especially on the left side due to right hip drop with left abductor weakness. Patient encouraged to continue her HEP and follow-up as scheduled. Pt will benefit from PT services to address deficits in strength, balance, and mobility in order to return to full function at home.    Personal Factors and Comorbidities Age;Comorbidity 3+;Past/Current Experience    Examination-Activity Limitations Stand;Locomotion Level;Squat;Stairs    Examination-Participation Restrictions Community Activity    Stability/Clinical Decision Making Evolving/Moderate complexity    Rehab Potential Good    PT Frequency 2x / week    PT Duration 4 weeks    PT Treatment/Interventions ADLs/Self Care Home Management;DME Instruction;Gait training;Stair training;Functional mobility training;Therapeutic activities;Therapeutic exercise;Balance training;Neuromuscular re-education;Patient/family education;Energy conservation    PT Next Visit Plan Improving trendelenburg during gait and reciprocal arm swing.    PT Home Exercise Plan Access Code:  [email or web address]    Consulted and Agree with Plan of Care Patient           Patient will benefit from skilled therapeutic intervention in order to improve the following deficits and impairments:  Abnormal gait, Pain, Improper body mechanics, Decreased mobility, Postural dysfunction, Decreased activity tolerance, Decreased endurance, Decreased strength, Decreased balance, Difficulty walking  Visit Diagnosis: Muscle weakness (generalized)  At risk for falls     Problem List Patient Active Problem List   Diagnosis Date Noted  . Current use of long term anticoagulation 05/07/2019  . Dizziness 05/07/2019  . Hypertension 04/26/2015  . Dry mouth 04/26/2015  . Morbid obesity ([place]) 09/04/2014  . Hypokalemia 08/12/2014  . Allergic rhinitis 08/04/2014  . Anxiety 08/04/2014  . Cannot sleep 08/04/2014  . Neuropathic pain 08/04/2014  . Atrial fibrillation ([place]) 08/04/2014  . Avitaminosis D 08/04/2014  . Gastroesophageal reflux disease without esophagitis 11/07/2013  . Polypharmacy 08/19/2013  . Osteoarthritis 06/11/2013  . Rheumatoid arthritis ([place]) 06/11/2013  . Hypercholesteremia 05/12/2009  . Benign essential tremor 05/12/2009  . Prediabetes 08/26/2008  . Adult hypothyroidism 08/26/2008  . Menopausal symptom 08/26/2008    Phillips Grout PT, DPT, GCS  Rogue Pautler 01/26/2020, 11:02 AM  [facility] [address] [place], [place], [address] Phone: [phone]   Fax:  [phone]  Name: Glenda Williams MRN: [id number] Date of Birth: 1940-09-06

## 2020-01-28 ENCOUNTER — Ambulatory Visit: Payer: Medicare Other

## 2020-02-02 ENCOUNTER — Other Ambulatory Visit: Payer: Self-pay

## 2020-02-02 ENCOUNTER — Ambulatory Visit: Payer: Medicare Other

## 2020-02-02 DIAGNOSIS — Z9181 History of falling: Secondary | ICD-10-CM | POA: Diagnosis not present

## 2020-02-02 DIAGNOSIS — M6281 Muscle weakness (generalized): Secondary | ICD-10-CM | POA: Diagnosis not present

## 2020-02-02 DIAGNOSIS — R2689 Other abnormalities of gait and mobility: Secondary | ICD-10-CM | POA: Diagnosis not present

## 2020-02-02 NOTE — Therapy (Signed)
[facility] [address] [place], [place], [address] Phone: [phone]   Fax:  [phone]  Physical Therapy Treatment  Patient Details  Name: Glenda Williams MRN: [id number] Date of Birth: Feb 22, 1941 Referring Provider (PT): Vladimir Crofts, MD   Encounter Date: 02/02/2020   PT End of Session - 02/02/20 0854    Visit Number 6    Number of Visits 9    Date for PT Re-Evaluation 02/09/20    PT Start Time 0850    PT Stop Time 0930    PT Time Calculation (min) 40 min    Equipment Utilized During Treatment Gait belt    Activity Tolerance Patient tolerated treatment well    Behavior During Therapy [facility] for tasks assessed/performed           Past Medical History:  Diagnosis Date  . A-fib ([place])   . Arthritis   . Bell palsy 09/04/2014  . GERD (gastroesophageal reflux disease)   . Heart disease   . Rheumatoid arthritis ([place])   . Thyroid disease   . Tremors of nervous system     Past Surgical History:  Procedure Laterality Date  . BREAST BIOPSY Left 1988   Benign  . BREAST BIOPSY Left 1987   Benign  . BREAST EXCISIONAL BIOPSY    . BROW LIFT Bilateral 05/23/2016   Procedure: BLEPHAROPLASTY upper eyelid; w/excess skin;  Surgeon: Karle Starch, MD;  Location: [facility];  Service: Ophthalmology;  Laterality: Bilateral;  . CARPAL TUNNEL RELEASE Bilateral   . CHOLECYSTECTOMY     Dr. Bary Castilla  . COLONOSCOPY  2006  . JOINT REPLACEMENT Bilateral    knees  . REPLACEMENT TOTAL KNEE Left 2005  . REPLACEMENT TOTAL KNEE Right 1998    There were no vitals filed for this visit.   Subjective Assessment - 02/02/20 0852    Subjective Pt continues to report R shoulder soreness today. Rates the pain as a 6/10 upon arrival. States that she did too much this morning. Pt still complains of stiffness upon waking this morning. No falls reported since last therapy session. No specific questions currently.    Limitations House hold  activities;Lifting;Walking    How long can you stand comfortably? 1 hour    How long can you walk comfortably? 15-20 min depending on walking surface.    Patient Stated Goals Improve walking endurance.    Currently in Pain? Yes    Pain Score 6     Pain Location Shoulder    Pain Orientation Right    Pain Descriptors / Indicators Sore    Pain Onset More than a month ago    Pain Frequency Intermittent                 TREATMENT   Ther-ex  Nu-Step L2 for 5 min LE's only during history for warm-up (seat position 9), 2 minutes unbilled;  Seated exercises with 3# ankle weights: LAQ x 15 BLE;  Standing mini squats with intermittent light touch on // bars x 10; Standing heel raises with BUE support x 15;  Standing exercises with 3# ankle weights: Marches x 15 BLE; Abduction x 15 BLE; HS curls x 15 BLE; Hip extension x 15 BLE;  Sit to stand from regular height chair with Airex pad on seat without UE support 2 x 10, intermittent hand on knees;   Neuromuscular Re-education All exercises performed without UE support in // bars unless otherwise specified: Airex alternating 6" step  taps with faded UE support 2-1 x 10 each, pt unable to let go entirely, pt provided cues to focus on keep hips level to minimize/eliminiate hip drop; Alternating 6" step-ups with faded UE support 2-1 x 10 leading with each LE, notable increase in difficulty on L side; Airex pad NBOS eyes open/closed x 30s each; Airex pad NBOS horizontal and vertical head turns x 30s each;   Pt educated throughout session about proper posture and technique with exercises. Improved exercise technique, movement at target joints, use of target muscles after min to mod verbal, visual, tactile cues.     Patient demonstrates excellent motivation during session today. Incorporated additional strengthening exercises with patient today in standing to increase challenge.  She continues to struggle with single-leg stance balance  especially on the left side due to right hip drop with left abductor weakness.  Notable left lower extremity weakness with alternating step ups in parallel bars.  Patient encouraged to continue her HEP and follow-up as scheduled. Pt will benefit from PT services to address deficits in strength, balance, and mobility in order to return to full function at home.                             PT Long Term Goals - 01/14/20 0959      PT LONG TERM GOAL #1   Title Pt will improve FOTO score to 60 to display significant improvements in functional mobility.    Baseline 11/8: 48/60    Time 4    Period Weeks    Status New    Target Date 02/09/20      PT LONG TERM GOAL #2   Title Pt will improve 30 sec STS to 13 reps to match appropriate age ranges for clinically significant LE strength for comunity level ADL's    Baseline 11/8: 6 reps, no UE support    Time 4    Period Weeks    Status New    Target Date 02/09/20      PT LONG TERM GOAL #3   Title Pt will improve TUG with SPC to < 12 sec to display decreased risk of falls.    Baseline 11/8: 14. 66 sec    Time 4    Period Weeks    Status New    Target Date 02/09/20      PT LONG TERM GOAL #4   Title Pt will improve Berg to > 52/56 to display significant decrease in falls risk with home and community tasks.    Baseline 11/8: 45/56    Time 4    Period Weeks    Status New    Target Date 02/09/20      PT LONG TERM GOAL #5   Title Pt will asc/desc full flight of stairs with SPC and single handrail and reciprocal pattern with < 2/10 pain via NPS to improve community level tasks.    Baseline 11/8: Asc/desc 4 stairs with reciprocal pattern, SPC, and handrail with up to 5/10 NPS and reports of difficulty. Good safety.    Time 4    Period Weeks    Status New    Target Date 02/09/20      Additional Long Term Goals   Additional Long Term Goals Yes      PT LONG TERM GOAL #6   Title Pt will improve DGI with no AD to >15 to  improve community ambulation tasks.    Baseline  11/10: 10/24    Time 4    Period Weeks    Status New    Target Date 02/09/20                 Plan - 02/02/20 0854    Clinical Impression Statement Patient demonstrates excellent motivation during session today. Incorporated additional strengthening exercises with patient today in standing to increase challenge.  She continues to struggle with single-leg stance balance especially on the left side due to right hip drop with left abductor weakness.  Notable left lower extremity weakness with alternating step ups in parallel bars.  Patient encouraged to continue her HEP and follow-up as scheduled. Pt will benefit from PT services to address deficits in strength, balance, and mobility in order to return to full function at home.    Personal Factors and Comorbidities Age;Comorbidity 3+;Past/Current Experience    Examination-Activity Limitations Stand;Locomotion Level;Squat;Stairs    Examination-Participation Restrictions Community Activity    Stability/Clinical Decision Making Evolving/Moderate complexity    Rehab Potential Good    PT Frequency 2x / week    PT Duration 4 weeks    PT Treatment/Interventions ADLs/Self Care Home Management;DME Instruction;Gait training;Stair training;Functional mobility training;Therapeutic activities;Therapeutic exercise;Balance training;Neuromuscular re-education;Patient/family education;Energy conservation    PT Next Visit Plan Improving trendelenburg during gait and reciprocal arm swing.    PT Home Exercise Plan Access Code: [email or web address]    Consulted and Agree with Plan of Care Patient           Patient will benefit from skilled therapeutic intervention in order to improve the following deficits and impairments:  Abnormal gait, Pain, Improper body mechanics, Decreased mobility, Postural dysfunction, Decreased activity tolerance, Decreased endurance, Decreased strength, Decreased balance, Difficulty  walking  Visit Diagnosis: Muscle weakness (generalized)  Other abnormalities of gait and mobility     Problem List Patient Active Problem List   Diagnosis Date Noted  . Current use of long term anticoagulation 05/07/2019  . Dizziness 05/07/2019  . Hypertension 04/26/2015  . Dry mouth 04/26/2015  . Morbid obesity ([place]) 09/04/2014  . Hypokalemia 08/12/2014  . Allergic rhinitis 08/04/2014  . Anxiety 08/04/2014  . Cannot sleep 08/04/2014  . Neuropathic pain 08/04/2014  . Atrial fibrillation ([place]) 08/04/2014  . Avitaminosis D 08/04/2014  . Gastroesophageal reflux disease without esophagitis 11/07/2013  . Polypharmacy 08/19/2013  . Osteoarthritis 06/11/2013  . Rheumatoid arthritis ([place]) 06/11/2013  . Hypercholesteremia 05/12/2009  . Benign essential tremor 05/12/2009  . Prediabetes 08/26/2008  . Adult hypothyroidism 08/26/2008  . Menopausal symptom 08/26/2008    Phillips Grout PT, DPT, GCS  Aibhlinn Kalmar 02/02/2020, 10:55 AM  [facility] [address] [place], [place], [address] Phone: [phone]   Fax:  [phone]  Name: AKOSUA CONSTANTINE MRN: [id number] Date of Birth: 15-Oct-1940

## 2020-02-04 ENCOUNTER — Ambulatory Visit: Payer: Medicare Other | Attending: Neurology

## 2020-02-04 ENCOUNTER — Other Ambulatory Visit: Payer: Self-pay

## 2020-02-04 DIAGNOSIS — R2689 Other abnormalities of gait and mobility: Secondary | ICD-10-CM | POA: Diagnosis not present

## 2020-02-04 DIAGNOSIS — M6281 Muscle weakness (generalized): Secondary | ICD-10-CM | POA: Insufficient documentation

## 2020-02-04 NOTE — Therapy (Signed)
[facility] [address] [place], [place], [address] Phone: [phone]   Fax:  [phone]  Physical Therapy Treatment  Patient Details  Name: Glenda Williams MRN: [id number] Date of Birth: [birth year] Referring Provider (PT): Vladimir Crofts, MD   Encounter Date: 02/04/2020   PT End of Session - 02/04/20 1006    Visit Number 7    Number of Visits 9    Date for PT Re-Evaluation 02/09/20    PT Start Time 0933    PT Stop Time 1016    PT Time Calculation (min) 43 min    Equipment Utilized During Treatment Gait belt    Activity Tolerance Patient tolerated treatment well    Behavior During Therapy [facility] for tasks assessed/performed           Past Medical History:  Diagnosis Date  . A-fib ([place])   . Arthritis   . Bell palsy 09/04/2014  . GERD (gastroesophageal reflux disease)   . Heart disease   . Rheumatoid arthritis ([place])   . Thyroid disease   . Tremors of nervous system     Past Surgical History:  Procedure Laterality Date  . BREAST BIOPSY Left 1988   Benign  . BREAST BIOPSY Left 1987   Benign  . BREAST EXCISIONAL BIOPSY    . BROW LIFT Bilateral 05/23/2016   Procedure: BLEPHAROPLASTY upper eyelid; w/excess skin;  Surgeon: Karle Starch, MD;  Location: [facility];  Service: Ophthalmology;  Laterality: Bilateral;  . CARPAL TUNNEL RELEASE Bilateral   . CHOLECYSTECTOMY     Dr. Bary Castilla  . COLONOSCOPY  2006  . JOINT REPLACEMENT Bilateral    knees  . REPLACEMENT TOTAL KNEE Left 2005  . REPLACEMENT TOTAL KNEE Right 1998    There were no vitals filed for this visit.   Subjective Assessment - 02/04/20 0936    Subjective Pt continues to report R shoulder soreness today. Rates the pain as a 2/10 upon arrival. Pt still complains of stiffness upon waking this morning. No falls reported since last therapy session. No specific questions currently.    Pertinent History Pt is a pleasant 79 y.o. female referred to PT for  parkinsonisms and imbalance. Pt has a PMH of Afib, RA, valvular disease, HTN, hypercholesterolemia, and obesity. Pt reports balance difficulties in the past 6-8 months with 2 falls falling in her office at her home tripping over a rug falling into the corner of her desk. Pt feels most off balance when she is tired or stressed she begins feeling unsteady. Pt denies dizziness or lightheadedness. Pt believes she has better balance without shoes on but has new SAS shoes that have been helpful for her. Pt displays difficulty walking on unstable surfaces like grass and pt has difficulty with stairs. Pt lives in a [facility] with flat entrance and accessible shower and elevated toilet with 42" doorways with wide hallways and lives with her husband. Pt's goal with PT is to improve her walking abilities and endurance.    Limitations House hold activities;Lifting;Walking    How long can you stand comfortably? 1 hour    How long can you walk comfortably? 15-20 min depending on walking surface.    Patient Stated Goals Improve walking endurance.    Currently in Pain? Yes    Pain Location Shoulder    Pain Orientation Right    Pain Descriptors / Indicators Sore    Pain Type Chronic pain    Pain Onset More than a  month ago    Pain Frequency Intermittent                  TREATMENT   Ther-ex  Nu-Step L2-3 for 5 min LE's only during history for warm-up (seat position 8), 3 minutes unbilled; Sit to stand from regular height chair with Airex pad on seat without UE support x 10, removed Airex pad and pt is able to perform an additional 10 reps, intermittent hand on knees; Resisted gait in // bars with double black theratube forward, backward, R lateral, and L lateral x 3 each direction;  Seated exercises with 4# ankle weights: LAQ x 15 BLE;  Standing heel/toe raises with BUE support x 15 each;  Standing exercises with 4# ankle weights: Marches x 15 BLE; Abduction x 15 BLE; HS curls x 15 BLE; Hip  extension x 15 BLE;   Neuromuscular Re-education All exercises performed without UE support in // bars unless otherwise specified: Alternating forward and lateral 6" step taps with BUE support x 15 each, pt instructed to focus on keep hips level to minimize/eliminiate hip drop; Alternating 6" step-ups with faded UE support 2-1 x 10 leading with each LE, notable increase in difficulty on L side; Front foot on BOSU (round side up) static balance to challenge partial single leg stability x 30s on each side;   Pt educated throughout session about proper posture and technique with exercises. Improved exercise technique, movement at target joints, use of target muscles after min to mod verbal, visual, tactile cues.    Patient demonstrates excellent motivation during session today.  Progressed ankle weights today.  Introduced resisted gait in parallel bars. She continues to struggle with single-leg stance balance especially on the left side due to right hip drop with left abductor weakness.  Incorporated  cues to stabilize hips and minimize Trendelenburg during single-leg stance.  Patient encouraged to continue her HEP and follow-up as scheduled. Pt will benefit from PT services to address deficits in strength, balance, and mobility in order to return to full function at home.                                 PT Long Term Goals - 01/14/20 0959      PT LONG TERM GOAL #1   Title Pt will improve FOTO score to 60 to display significant improvements in functional mobility.    Baseline 11/8: 48/60    Time 4    Period Weeks    Status New    Target Date 02/09/20      PT LONG TERM GOAL #2   Title Pt will improve 30 sec STS to 13 reps to match appropriate age ranges for clinically significant LE strength for comunity level ADL's    Baseline 11/8: 6 reps, no UE support    Time 4    Period Weeks    Status New    Target Date 02/09/20      PT LONG TERM GOAL #3    Title Pt will improve TUG with SPC to < 12 sec to display decreased risk of falls.    Baseline 11/8: 14. 66 sec    Time 4    Period Weeks    Status New    Target Date 02/09/20      PT LONG TERM GOAL #4   Title Pt will improve Berg to > 52/56 to display significant decrease in falls risk with  home and community tasks.    Baseline 11/8: 45/56    Time 4    Period Weeks    Status New    Target Date 02/09/20      PT LONG TERM GOAL #5   Title Pt will asc/desc full flight of stairs with SPC and single handrail and reciprocal pattern with < 2/10 pain via NPS to improve community level tasks.    Baseline 11/8: Asc/desc 4 stairs with reciprocal pattern, SPC, and handrail with up to 5/10 NPS and reports of difficulty. Good safety.    Time 4    Period Weeks    Status New    Target Date 02/09/20      Additional Long Term Goals   Additional Long Term Goals Yes      PT LONG TERM GOAL #6   Title Pt will improve DGI with no AD to >15 to improve community ambulation tasks.    Baseline 11/10: 10/24    Time 4    Period Weeks    Status New    Target Date 02/09/20                 Plan - 02/04/20 1006    Clinical Impression Statement Patient demonstrates excellent motivation during session today.  Progressed ankle weights today.  Introduced resisted gait in parallel bars. She continues to struggle with single-leg stance balance especially on the left side due to right hip drop with left abductor weakness.  Incorporated  cues to stabilize hips and minimize Trendelenburg during single-leg stance.  Patient encouraged to continue her HEP and follow-up as scheduled. Pt will benefit from PT services to address deficits in strength, balance, and mobility in order to return to full function at home.    Personal Factors and Comorbidities Age;Comorbidity 3+;Past/Current Experience    Examination-Activity Limitations Stand;Locomotion Level;Squat;Stairs    Examination-Participation Restrictions Community  Activity    Stability/Clinical Decision Making Evolving/Moderate complexity    Rehab Potential Good    PT Frequency 2x / week    PT Duration 4 weeks    PT Treatment/Interventions ADLs/Self Care Home Management;DME Instruction;Gait training;Stair training;Functional mobility training;Therapeutic activities;Therapeutic exercise;Balance training;Neuromuscular re-education;Patient/family education;Energy conservation    PT Next Visit Plan Update outcome measures/goals, recertification, improving trendelenburg during gait and reciprocal arm swing.    PT Home Exercise Plan Access Code: [email or web address]    Consulted and Agree with Plan of Care Patient           Patient will benefit from skilled therapeutic intervention in order to improve the following deficits and impairments:  Abnormal gait, Pain, Improper body mechanics, Decreased mobility, Postural dysfunction, Decreased activity tolerance, Decreased endurance, Decreased strength, Decreased balance, Difficulty walking  Visit Diagnosis: Muscle weakness (generalized)  Other abnormalities of gait and mobility     Problem List Patient Active Problem List   Diagnosis Date Noted  . Current use of long term anticoagulation 05/07/2019  . Dizziness 05/07/2019  . Hypertension 04/26/2015  . Dry mouth 04/26/2015  . Morbid obesity ([place]) 09/04/2014  . Hypokalemia 08/12/2014  . Allergic rhinitis 08/04/2014  . Anxiety 08/04/2014  . Cannot sleep 08/04/2014  . Neuropathic pain 08/04/2014  . Atrial fibrillation ([place]) 08/04/2014  . Avitaminosis D 08/04/2014  . Gastroesophageal reflux disease without esophagitis 11/07/2013  . Polypharmacy 08/19/2013  . Osteoarthritis 06/11/2013  . Rheumatoid arthritis ([place]) 06/11/2013  . Hypercholesteremia 05/12/2009  . Benign essential tremor 05/12/2009  . Prediabetes 08/26/2008  . Adult hypothyroidism 08/26/2008  . Menopausal symptom 08/26/2008  Lyndel Safe Avy Barlett PT, DPT, GCS  Lourie Retz 02/04/2020, 11:01  AM  [facility] [address]. [place], [place], [address] Phone: [phone]   Fax:  [phone]  Name: Glenda Williams MRN: [id number] Date of Birth: Mar 04, 1941

## 2020-02-06 ENCOUNTER — Telehealth: Payer: Self-pay

## 2020-02-06 NOTE — Telephone Encounter (Signed)
Copied from [place] [id number]. Topic: General - Other >> Feb 06, 2020 11:20 AM Hinda Lenis D wrote: PT asking  for some samples / rivaroxaban (XARELTO) 20 MG TABS tablet [[id number]] / please advise

## 2020-02-06 NOTE — Telephone Encounter (Signed)
Is it okay to give if available?  Thanks,   -Mickel Baas

## 2020-02-09 ENCOUNTER — Other Ambulatory Visit: Payer: Self-pay

## 2020-02-09 ENCOUNTER — Ambulatory Visit: Payer: Medicare Other

## 2020-02-09 NOTE — Telephone Encounter (Signed)
Patient called last month for samples and has not picked them up. Called patient and she reports she will pick Xarelto up.

## 2020-02-09 NOTE — Telephone Encounter (Signed)
Yes please give her samples if we have them.

## 2020-02-11 ENCOUNTER — Other Ambulatory Visit: Payer: Self-pay

## 2020-02-11 ENCOUNTER — Ambulatory Visit: Payer: Medicare Other

## 2020-02-11 VITALS — BP 156/61 | HR 72

## 2020-02-11 DIAGNOSIS — R2689 Other abnormalities of gait and mobility: Secondary | ICD-10-CM | POA: Diagnosis not present

## 2020-02-11 DIAGNOSIS — M6281 Muscle weakness (generalized): Secondary | ICD-10-CM | POA: Diagnosis not present

## 2020-02-11 NOTE — Therapy (Signed)
[facility] [address] [place], [place], [address] Phone: [phone]   Fax:  [phone]  Physical Therapy Treatment/Recertification  Patient Details  Name: Glenda Williams MRN: [id number] Date of Birth: [birth year] Referring Provider (PT): Vladimir Crofts, MD   Encounter Date: 02/11/2020   PT End of Session - 02/11/20 1026    Visit Number 8    Number of Visits 25    Date for PT Re-Evaluation 04/07/20    PT Start Time 0847    PT Stop Time 0930    PT Time Calculation (min) 43 min    Equipment Utilized During Treatment Gait belt    Activity Tolerance Patient tolerated treatment well    Behavior During Therapy [facility] for tasks assessed/performed           Past Medical History:  Diagnosis Date  . A-fib ([place])   . Arthritis   . Bell palsy 09/04/2014  . GERD (gastroesophageal reflux disease)   . Heart disease   . Rheumatoid arthritis ([place])   . Thyroid disease   . Tremors of nervous system     Past Surgical History:  Procedure Laterality Date  . BREAST BIOPSY Left 1988   Benign  . BREAST BIOPSY Left 1987   Benign  . BREAST EXCISIONAL BIOPSY    . BROW LIFT Bilateral 05/23/2016   Procedure: BLEPHAROPLASTY upper eyelid; w/excess skin;  Surgeon: Karle Starch, MD;  Location: [facility];  Service: Ophthalmology;  Laterality: Bilateral;  . CARPAL TUNNEL RELEASE Bilateral   . CHOLECYSTECTOMY     Dr. Bary Castilla  . COLONOSCOPY  2006  . JOINT REPLACEMENT Bilateral    knees  . REPLACEMENT TOTAL KNEE Left 2005  . REPLACEMENT TOTAL KNEE Right 1998    Vitals:   02/11/20 0927  BP: (!) 156/61  Pulse: 72  SpO2: 98%     Subjective Assessment - 02/11/20 0853    Subjective Pt reports that she is doing alright today. She reports generalized aches/pains with the weather turning colder and the rain. No falls reported since last therapy session. No specific questions currently.    Pertinent History Pt is a pleasant 79 y.o. female  referred to PT for parkinsonisms and imbalance. Pt has a PMH of Afib, RA, valvular disease, HTN, hypercholesterolemia, and obesity. Pt reports balance difficulties in the past 6-8 months with 2 falls falling in her office at her home tripping over a rug falling into the corner of her desk. Pt feels most off balance when she is tired or stressed she begins feeling unsteady. Pt denies dizziness or lightheadedness. Pt believes she has better balance without shoes on but has new SAS shoes that have been helpful for her. Pt displays difficulty walking on unstable surfaces like grass and pt has difficulty with stairs. Pt lives in a [facility] with flat entrance and accessible shower and elevated toilet with 42" doorways with wide hallways and lives with her husband. Pt's goal with PT is to improve her walking abilities and endurance.    Limitations House hold activities;Lifting;Walking    How long can you stand comfortably? 1 hour    How long can you walk comfortably? 15-20 min depending on walking surface.    Patient Stated Goals Improve walking endurance.    Currently in Pain? Yes   Generalized pain without specific reports   Pain Onset --              [facility] PT Assessment - 02/11/20 4462  Standardized Balance Assessment   Standardized Balance Assessment Berg Balance Test;Dynamic Gait Index      Berg Balance Test   Sit to Stand Able to stand without using hands and stabilize independently    Standing Unsupported Able to stand safely 2 minutes    Sitting with Back Unsupported but Feet Supported on Floor or Stool Able to sit safely and securely 2 minutes    Stand to Sit Sits safely with minimal use of hands    Transfers Able to transfer safely, minor use of hands    Standing Unsupported with Eyes Closed Able to stand 10 seconds safely    Standing Unsupported with Feet Together Able to place feet together independently and stand 1 minute safely    From Standing, Reach Forward with Outstretched Arm Can  reach forward >12 cm safely (5")    From Standing Position, Pick up Object from Floor Able to pick up shoe safely and easily    From Standing Position, Turn to Look Behind Over each Shoulder Looks behind from both sides and weight shifts well    Turn 360 Degrees Able to turn 360 degrees safely but slowly    Standing Unsupported, Alternately Place Feet on Step/Stool Able to complete >2 steps/needs minimal assist    Standing Unsupported, One Foot in Front Able to plae foot ahead of the other independently and hold 30 seconds    Standing on One Leg Tries to lift leg/unable to hold 3 seconds but remains standing independently    Total Score 46      Dynamic Gait Index   Level Surface Mild Impairment    Change in Gait Speed Mild Impairment    Gait with Horizontal Head Turns Mild Impairment    Gait with Vertical Head Turns Mild Impairment    Gait and Pivot Turn Mild Impairment    Step Over Obstacle Moderate Impairment    Step Around Obstacles Normal    Steps Mild Impairment    Total Score 16            TREATMENT   Ther-ex  Nu-Step L0 for 5 min, LE's only during history for warm-up (seat position 8), 3 minutes unbilled; Updated outcome measures and goals with patient: 30s Sit to Stand: 11 repetitions TUG: 11.9s BERG: 46/56 Stairs: reciprocal pattern with BUE support, able to perform with single point cane and single handrail, pain increases in back to 5/10 DGI: 16/24 with single point cane, unable to perform without assistive device at this time;  Sit to stand from regular height chair with Airex pad on seat without UE support 2 x 10;   Patient demonstrates excellent motivation during session today. Updated goals and outcome measures with patient during session. Her 30s Sit to Stand test improved from 6 repetitions at initial evaluation to 11 repetitions today. Her TUG also dropped from 14.7s to 11.9s. Her BERG only shows very minimal improvement in her balance improving from 45/56  at initial evaluation to 46/56 today. She is significant limited by hip abductor weakness and low back pain when attempting single stance or stairs. She is still unable to perform the DGI without an assistive device however scores 16/24 with a single point cane. Pt has not achieved maximal benefit from physical therapy and would benefit from continued skilled PT services to address deficits in balance, strength, and pain in order to improve function at home and decrease her risk for falls.  PT Long Term Goals - 02/11/20 0855      PT LONG TERM GOAL #1   Title Pt will improve FOTO score to 60 to display significant improvements in functional mobility.    Baseline 11/8: 48; 02/11/20: 40    Time 8    Period Weeks    Status On-going    Target Date 04/07/20      PT LONG TERM GOAL #2   Title Pt will improve 30 sec STS to 13 reps to match appropriate age ranges for clinically significant LE strength for comunity level ADL's    Baseline 11/8: 6 reps, no UE support; 02/11/20: 11 reps, no UE support    Time 8    Period Weeks    Status Partially Met    Target Date 04/07/20      PT LONG TERM GOAL #3   Title Pt will improve TUG with SPC to < 12 sec to display decreased risk of falls.    Baseline 11/8: 14. 66 sec; 02/11/20: 11.9s    Time 4    Period Weeks    Status Achieved      PT LONG TERM GOAL #4   Title Pt will improve Berg to > 52/56 to display significant decrease in falls risk with home and community tasks.    Baseline 11/8: 45/56; 02/11/20: 46/56    Time 8    Period Weeks    Status Partially Met    Target Date 04/07/20      PT LONG TERM GOAL #5   Title Pt will asc/desc full flight of stairs with SPC and single handrail and reciprocal pattern with < 2/10 pain via NPS to improve community level tasks.    Baseline 11/8: Asc/desc 4 stairs with reciprocal pattern, SPC, and handrail with up to 5/10 NPS and reports of difficulty. Good  safety. 02/11/20 reciprocal with BUE support, able to perform with single point cane as well with single rail, pain increases in back to 5/10    Time 8    Period Weeks    Status On-going    Target Date 04/07/20      Additional Long Term Goals   Additional Long Term Goals Yes      PT LONG TERM GOAL #6   Title Pt will improve DGI with no AD to >15 to improve community ambulation tasks.    Baseline 11/10: 10/24; 02/11/20: 16/24 with single point cane, unable to perform without assistive device at this time;    Time 8    Period Weeks    Status On-going    Target Date 04/07/20                 Plan - 02/11/20 1027    Personal Factors and Comorbidities Age;Comorbidity 3+;Past/Current Experience    Examination-Activity Limitations Stand;Locomotion Level;Squat;Stairs    Examination-Participation Restrictions Community Activity    Stability/Clinical Decision Making Evolving/Moderate complexity    Rehab Potential Good    PT Frequency 2x / week    PT Duration 4 weeks    PT Treatment/Interventions ADLs/Self Care Home Management;DME Instruction;Gait training;Stair training;Functional mobility training;Therapeutic activities;Therapeutic exercise;Balance training;Neuromuscular re-education;Patient/family education;Energy conservation    PT Next Visit Plan Update outcome measures/goals, recertification, improving trendelenburg during gait and reciprocal arm swing.    PT Home Exercise Plan Access Code: [email or web address]    Consulted and Agree with Plan of Care Patient           Patient will benefit from skilled therapeutic intervention in order to  improve the following deficits and impairments:  Abnormal gait, Pain, Improper body mechanics, Decreased mobility, Postural dysfunction, Decreased activity tolerance, Decreased endurance, Decreased strength, Decreased balance, Difficulty walking  Visit Diagnosis: Muscle weakness (generalized)  Other abnormalities of gait and mobility     Problem  List Patient Active Problem List   Diagnosis Date Noted  . Current use of long term anticoagulation 05/07/2019  . Dizziness 05/07/2019  . Hypertension 04/26/2015  . Dry mouth 04/26/2015  . Morbid obesity ([place]) 09/04/2014  . Hypokalemia 08/12/2014  . Allergic rhinitis 08/04/2014  . Anxiety 08/04/2014  . Cannot sleep 08/04/2014  . Neuropathic pain 08/04/2014  . Atrial fibrillation ([place]) 08/04/2014  . Avitaminosis D 08/04/2014  . Gastroesophageal reflux disease without esophagitis 11/07/2013  . Polypharmacy 08/19/2013  . Osteoarthritis 06/11/2013  . Rheumatoid arthritis ([place]) 06/11/2013  . Hypercholesteremia 05/12/2009  . Benign essential tremor 05/12/2009  . Prediabetes 08/26/2008  . Adult hypothyroidism 08/26/2008  . Menopausal symptom 08/26/2008   Phillips Grout PT, DPT, GCS  Merrin Mcvicker 02/11/2020, 10:42 AM  [facility] [address] [place], [place], [address] Phone: [phone]   Fax:  [phone]  Name: Glenda Williams MRN: [id number] Date of Birth: Aug 13, 1940

## 2020-02-16 ENCOUNTER — Ambulatory Visit: Payer: Medicare Other

## 2020-02-17 DIAGNOSIS — M0579 Rheumatoid arthritis with rheumatoid factor of multiple sites without organ or systems involvement: Secondary | ICD-10-CM | POA: Diagnosis not present

## 2020-02-18 ENCOUNTER — Other Ambulatory Visit: Payer: Self-pay

## 2020-02-18 ENCOUNTER — Ambulatory Visit: Payer: Medicare Other

## 2020-02-18 DIAGNOSIS — R42 Dizziness and giddiness: Secondary | ICD-10-CM | POA: Diagnosis not present

## 2020-02-18 DIAGNOSIS — R0602 Shortness of breath: Secondary | ICD-10-CM | POA: Diagnosis not present

## 2020-02-18 DIAGNOSIS — E7800 Pure hypercholesterolemia, unspecified: Secondary | ICD-10-CM | POA: Diagnosis not present

## 2020-02-18 DIAGNOSIS — R2689 Other abnormalities of gait and mobility: Secondary | ICD-10-CM

## 2020-02-18 DIAGNOSIS — Z6837 Body mass index (BMI) 37.0-37.9, adult: Secondary | ICD-10-CM | POA: Diagnosis not present

## 2020-02-18 DIAGNOSIS — I38 Endocarditis, valve unspecified: Secondary | ICD-10-CM | POA: Diagnosis not present

## 2020-02-18 DIAGNOSIS — Z7901 Long term (current) use of anticoagulants: Secondary | ICD-10-CM | POA: Diagnosis not present

## 2020-02-18 DIAGNOSIS — M6281 Muscle weakness (generalized): Secondary | ICD-10-CM

## 2020-02-18 DIAGNOSIS — I10 Essential (primary) hypertension: Secondary | ICD-10-CM | POA: Diagnosis not present

## 2020-02-18 DIAGNOSIS — I480 Paroxysmal atrial fibrillation: Secondary | ICD-10-CM | POA: Diagnosis not present

## 2020-02-18 DIAGNOSIS — E6609 Other obesity due to excess calories: Secondary | ICD-10-CM | POA: Diagnosis not present

## 2020-02-18 NOTE — Therapy (Signed)
[facility] [address] [place], [place], [address] Phone: [phone]   Fax:  [phone]  Physical Therapy Treatment  Patient Details  Name: Glenda Williams MRN: [id number] Date of Birth: 1940/08/12 Referring Provider (PT): Vladimir Crofts, MD   Encounter Date: 02/18/2020   PT End of Session - 02/18/20 0900    Visit Number 9    Number of Visits 25    Date for PT Re-Evaluation 04/07/20    PT Start Time 0850    PT Stop Time 0930    PT Time Calculation (min) 40 min    Equipment Utilized During Treatment Gait belt    Activity Tolerance Patient tolerated treatment well    Behavior During Therapy [facility] for tasks assessed/performed           Past Medical History:  Diagnosis Date   A-fib [facility])    Arthritis    Bell palsy 09/04/2014   GERD (gastroesophageal reflux disease)    Heart disease    Rheumatoid arthritis ([place])    Thyroid disease    Tremors of nervous system     Past Surgical History:  Procedure Laterality Date   BREAST BIOPSY Left 1988   Benign   BREAST BIOPSY Left 1987   Benign   BREAST EXCISIONAL BIOPSY     BROW LIFT Bilateral 05/23/2016   Procedure: BLEPHAROPLASTY upper eyelid; w/excess skin;  Surgeon: Karle Starch, MD;  Location: [place];  Service: Ophthalmology;  Laterality: Bilateral;   CARPAL TUNNEL RELEASE Bilateral    CHOLECYSTECTOMY     Dr. Bary Castilla   COLONOSCOPY  2006   JOINT REPLACEMENT Bilateral    knees   REPLACEMENT TOTAL KNEE Left 2005   REPLACEMENT TOTAL KNEE Right 1998    There were no vitals filed for this visit.   Subjective Assessment - 02/18/20 0858    Subjective Pt reports that she is very sore today. She reports soreness which started the evening of the following day after her last therapy session. She is complaining of a lot of cramps in her legs today. She has a cardiology appointment later today. No falls reported since last therapy session. No specific  questions currently.    Pertinent History Pt is a pleasant 79 y.o. female referred to PT for parkinsonisms and imbalance. Pt has a PMH of Afib, RA, valvular disease, HTN, hypercholesterolemia, and obesity. Pt reports balance difficulties in the past 6-8 months with 2 falls falling in her office at her home tripping over a rug falling into the corner of her desk. Pt feels most off balance when she is tired or stressed she begins feeling unsteady. Pt denies dizziness or lightheadedness. Pt believes she has better balance without shoes on but has new SAS shoes that have been helpful for her. Pt displays difficulty walking on unstable surfaces like grass and pt has difficulty with stairs. Pt lives in a [facility] with flat entrance and accessible shower and elevated toilet with 42" doorways with wide hallways and lives with her husband. Pt's goal with PT is to improve her walking abilities and endurance.    Limitations House hold activities;Lifting;Walking    How long can you stand comfortably? 1 hour    How long can you walk comfortably? 15-20 min depending on walking surface.    Patient Stated Goals Improve walking endurance.    Currently in Pain? No/denies   Pt denies pain but complains of leg soreness  TREATMENT   Ther-ex  Nu-Step L0 for 8 min LE's only during history for warm-up (seat position 9), 3 minutes unbilled; Hooklying marches 2 x 10 BLE; Hooklying SLR x 10 BLE; Hooklying clams with manual resistance from therapist 2 x 10 BLE; Hooklying adductor squeeze with manual resistance from therapist 2 x 10 BLE; Hooklying straight leg hip abduction/adduction with manual resistance for both x 10 BLE; Hooklying partial bridges x 10; Seated LAQ with 3# ankle weights x 10 BLE; Seated marches with 3# ankle weights x 10 BLE;   Neuromuscular Re-education All exercises performed without UE support next to t unless otherwise specified: Alternating forward  6" step taps with BUE support  x 10, decreased to single UE support and performed an additional 10, pt cued to focus on keep hips level to minimize/eliminiate hip drop; Alternating heel/toe raises without UE support x 10 each direction; NBOS eyes open/closed x 30s each; NBOS horizontal and vertical head turns x 30s each;   Pt educated throughout session about proper posture and technique with exercises. Improved exercise technique, movement at target joints, use of target muscles after min to mod verbal, visual, tactile cues.    Patient demonstrates excellent motivation during session today. Performed hooklying and seated strengthening today to minimize soreness in BLE. Limited balance exercises in standing. Incorporated  cues to stabilize hips and minimize Trendelenburg during single-leg stance.  Patient encouraged to continue her HEP and follow-up as scheduled. Pt will benefit from PT services to address deficits in strength, balance, and mobility in order to return to full function at home.                                 PT Long Term Goals - 02/11/20 0855      PT LONG TERM GOAL #1   Title Pt will improve FOTO score to 60 to display significant improvements in functional mobility.    Baseline 11/8: 48; 02/11/20: 40    Time 8    Period Weeks    Status On-going    Target Date 04/07/20      PT LONG TERM GOAL #2   Title Pt will improve 30 sec STS to 13 reps to match appropriate age ranges for clinically significant LE strength for comunity level ADL's    Baseline 11/8: 6 reps, no UE support; 02/11/20: 11 reps, no UE support    Time 8    Period Weeks    Status Partially Met    Target Date 04/07/20      PT LONG TERM GOAL #3   Title Pt will improve TUG with SPC to < 12 sec to display decreased risk of falls.    Baseline 11/8: 14. 66 sec; 02/11/20: 11.9s    Time 4    Period Weeks    Status Achieved      PT LONG TERM GOAL #4   Title Pt will improve Berg to > 52/56 to display  significant decrease in falls risk with home and community tasks.    Baseline 11/8: 45/56; 02/11/20: 46/56    Time 8    Period Weeks    Status Partially Met    Target Date 04/07/20      PT LONG TERM GOAL #5   Title Pt will asc/desc full flight of stairs with SPC and single handrail and reciprocal pattern with < 2/10 pain via NPS to improve community level tasks.    Baseline  11/8: Asc/desc 4 stairs with reciprocal pattern, SPC, and handrail with up to 5/10 NPS and reports of difficulty. Good safety. 02/11/20 reciprocal with BUE support, able to perform with single point cane as well with single rail, pain increases in back to 5/10    Time 8    Period Weeks    Status On-going    Target Date 04/07/20      Additional Long Term Goals   Additional Long Term Goals Yes      PT LONG TERM GOAL #6   Title Pt will improve DGI with no AD to >15 to improve community ambulation tasks.    Baseline 11/10: 10/24; 02/11/20: 16/24 with single point cane, unable to perform without assistive device at this time;    Time 8    Period Weeks    Status On-going    Target Date 04/07/20                 Plan - 02/18/20 0901    Clinical Impression Statement Patient demonstrates excellent motivation during session today. Performed hooklying and seated strengthening today to minimize soreness in BLE. Limited balance exercises in standing. Incorporated  cues to stabilize hips and minimize Trendelenburg during single-leg stance.  Patient encouraged to continue her HEP and follow-up as scheduled. Pt will benefit from PT services to address deficits in strength, balance, and mobility in order to return to full function at home.    Personal Factors and Comorbidities Age;Comorbidity 3+;Past/Current Experience    Examination-Activity Limitations Stand;Locomotion Level;Squat;Stairs    Examination-Participation Restrictions Community Activity    Stability/Clinical Decision Making Evolving/Moderate complexity    Rehab  Potential Good    PT Frequency 2x / week    PT Duration 4 weeks    PT Treatment/Interventions ADLs/Self Care Home Management;DME Instruction;Gait training;Stair training;Functional mobility training;Therapeutic activities;Therapeutic exercise;Balance training;Neuromuscular re-education;Patient/family education;Energy conservation    PT Next Visit Plan progress note, balance and strength, hip abductor strengthening to improve trendelenburg during gait, work on reciprocal arm swing.    PT Home Exercise Plan Access Code: [email or web address]    Consulted and Agree with Plan of Care Patient           Patient will benefit from skilled therapeutic intervention in order to improve the following deficits and impairments:  Abnormal gait,Pain,Improper body mechanics,Decreased mobility,Postural dysfunction,Decreased activity tolerance,Decreased endurance,Decreased strength,Decreased balance,Difficulty walking  Visit Diagnosis: Muscle weakness (generalized)  Other abnormalities of gait and mobility     Problem List Patient Active Problem List   Diagnosis Date Noted   Current use of long term anticoagulation 05/07/2019   Dizziness 05/07/2019   Hypertension 04/26/2015   Dry mouth 04/26/2015   Morbid obesity ([place]) 09/04/2014   Hypokalemia 08/12/2014   Allergic rhinitis 08/04/2014   Anxiety 08/04/2014   Cannot sleep 08/04/2014   Neuropathic pain 08/04/2014   Atrial fibrillation ([place]) 08/04/2014   Avitaminosis D 08/04/2014   Gastroesophageal reflux disease without esophagitis 11/07/2013   Polypharmacy 08/19/2013   Osteoarthritis 06/11/2013   Rheumatoid arthritis ([place]) 06/11/2013   Hypercholesteremia 05/12/2009   Benign essential tremor 05/12/2009   Prediabetes 08/26/2008   Adult hypothyroidism 08/26/2008   Menopausal symptom 08/26/2008   Lyndel Safe Eeva Schlosser PT, DPT, GCS  Tyshawn Keel 02/18/2020, 10:34 AM  [facility] [facility] [address] [place], [place], [address] Phone: [phone]   Fax:  [phone]  Name: Glenda Williams MRN: [id number] Date of Birth: 24-Aug-1940

## 2020-02-23 ENCOUNTER — Ambulatory Visit: Payer: Medicare Other

## 2020-02-23 ENCOUNTER — Other Ambulatory Visit: Payer: Self-pay

## 2020-02-23 DIAGNOSIS — M6281 Muscle weakness (generalized): Secondary | ICD-10-CM | POA: Diagnosis not present

## 2020-02-23 DIAGNOSIS — R2689 Other abnormalities of gait and mobility: Secondary | ICD-10-CM

## 2020-02-23 NOTE — Therapy (Signed)
**Note Glenda-Identified via Obfuscation** [facility] [address]. [place], [place], [address] Phone: [phone]   Fax:  [phone]  Physical Therapy Progress Note   Dates of reporting period  01/12/20   to   02/23/20  Patient Details  Name: Glenda Williams MRN: [id number] Date of Birth: 10-30-1940 Referring Provider (PT): Vladimir Crofts, MD   Encounter Date: 02/23/2020   PT End of Session - 02/23/20 0919    Visit Number 10    Number of Visits 25    Date for PT Re-Evaluation 04/07/20    PT Start Time 0920    PT Stop Time 0950    PT Time Calculation (min) 30 min    Equipment Utilized During Treatment Gait belt    Activity Tolerance Patient tolerated treatment well    Behavior During Therapy [facility] for tasks assessed/performed           Past Medical History:  Diagnosis Date   A-fib ([place])    Arthritis    Bell palsy 09/04/2014   GERD (gastroesophageal reflux disease)    Heart disease    Rheumatoid arthritis ([place])    Thyroid disease    Tremors of nervous system     Past Surgical History:  Procedure Laterality Date   BREAST BIOPSY Left 1988   Benign   BREAST BIOPSY Left 1987   Benign   BREAST EXCISIONAL BIOPSY     BROW LIFT Bilateral 05/23/2016   Procedure: BLEPHAROPLASTY upper eyelid; w/excess skin;  Surgeon: Karle Starch, MD;  Location: [place];  Service: Ophthalmology;  Laterality: Bilateral;   CARPAL TUNNEL RELEASE Bilateral    CHOLECYSTECTOMY     Dr. Bary Castilla   COLONOSCOPY  2006   JOINT REPLACEMENT Bilateral    knees   REPLACEMENT TOTAL KNEE Left 2005   REPLACEMENT TOTAL KNEE Right 1998    There were no vitals filed for this visit.   Subjective Assessment - 02/23/20 0919    Subjective Pt reports that she is doing alright today. She reports that she had some R low back soreness after doing the bridges during the last session. She is complaining of L shoulder soreness today upon arrival which is chronic. Rates L shoulder pain  as 8/10 upon arrival. No specific questions currently.    Pertinent History Pt is a pleasant 79 y.o. female referred to PT for parkinsonisms and imbalance. Pt has a PMH of Afib, RA, valvular disease, HTN, hypercholesterolemia, and obesity. Pt reports balance difficulties in the past 6-8 months with 2 falls falling in her office at her home tripping over a rug falling into the corner of her desk. Pt feels most off balance when she is tired or stressed she begins feeling unsteady. Pt denies dizziness or lightheadedness. Pt believes she has better balance without shoes on but has new SAS shoes that have been helpful for her. Pt displays difficulty walking on unstable surfaces like grass and pt has difficulty with stairs. Pt lives in a [facility] with flat entrance and accessible shower and elevated toilet with 42" doorways with wide hallways and lives with her husband. Pt's goal with PT is to improve her walking abilities and endurance.    Limitations House hold activities;Lifting;Walking    How long can you stand comfortably? 1 hour    How long can you walk comfortably? 15-20 min depending on walking surface.    Patient Stated Goals Improve walking endurance.    Currently in Pain? Yes    Pain Score  8     Pain Location Shoulder    Pain Orientation Left    Pain Descriptors / Indicators Sore    Pain Type Chronic pain    Pain Onset More than a month ago    Pain Frequency Intermittent              TREATMENT   Ther-ex  Nu-Step L0 for 8 min LE's only during history for warm-up (seat position 9), 3 minutes unbilled; Seated marches x 10 BLE, added 3# ankle weights x 10 BLE; Seated LAQ un weighted x 10 BLE, added 3# ankle weights x 10 BLE; Seated clams with green tband 2 x 10 BLE; Seated adductor ball squeeze 3s hold 2 x 10 BLE;   Neuromuscular Re-education All exercises performed without UE support next to t unless otherwise specified: Alternating forward  6" step taps with BUE support x 10,  decreased to single UE support and performed an additional 10, pt cued to focus on keep hips level to minimize/eliminiate hip drop; Alternating heel/toe raises without UE support x 10 each direction; NBOS eyes open/closed x 30s each; NBOS horizontal and vertical head turns x 30s each;   Pt educated throughout session about proper posture and technique with exercises. Improved exercise technique, movement at target joints, use of target muscles after min to mod verbal, visual, tactile cues.    Patient demonstrates excellent motivation during session today. Updated outcome measures with patient on 02/11/20 so deferred today. At that time her 62s Sit to Stand Test improved from 6 repetitions at initial evaluation to 11 repetitions. Her TUG also dropped from 14.7s to 11.9s. Her BERG only showed very minimal improvement in her balance improving from 45/56 at initial evaluation to 46/56. She is significant limited by hip abductor weakness and low back pain when attempting single stance or stairs. She is still unable to perform the DGI without an assistive device however scored 16/24 with a single point cane. Pt has not achieved maximal benefit from physical therapy and would benefit from continued skilled PT services to address deficits in balance, strength, and pain in order to improve function at home and decrease her risk for falls.                                   PT Long Term Goals - 02/23/20 0929      PT LONG TERM GOAL #1   Title Pt will improve FOTO score to 60 to display significant improvements in functional mobility.    Baseline 11/8: 48; 02/11/20: 40    Time 8    Period Weeks    Status On-going    Target Date 04/07/20      PT LONG TERM GOAL #2   Title Pt will improve 30 sec STS to 13 reps to match appropriate age ranges for clinically significant LE strength for comunity level ADL's    Baseline 11/8: 6 reps, no UE support; 02/11/20: 11 reps, no UE support     Time 8    Period Weeks    Status Partially Met    Target Date 04/07/20      PT LONG TERM GOAL #3   Title Pt will improve TUG with SPC to < 12 sec to display decreased risk of falls.    Baseline 11/8: 14. 66 sec; 02/11/20: 11.9s    Time 4    Period Weeks    Status Achieved  Target Date 04/07/20      PT LONG TERM GOAL #4   Title Pt will improve Berg to > 52/56 to display significant decrease in falls risk with home and community tasks.    Baseline 11/8: 45/56; 02/11/20: 46/56    Time 8    Period Weeks    Status Partially Met    Target Date 04/07/20      PT LONG TERM GOAL #5   Title Pt will asc/desc full flight of stairs with SPC and single handrail and reciprocal pattern with < 2/10 pain via NPS to improve community level tasks.    Baseline 11/8: Asc/desc 4 stairs with reciprocal pattern, SPC, and handrail with up to 5/10 NPS and reports of difficulty. Good safety. 02/11/20 reciprocal with BUE support, able to perform with single point cane as well with single rail, pain increases in back to 5/10    Time 8    Period Weeks    Status On-going    Target Date 04/07/20      PT LONG TERM GOAL #6   Title Pt will improve DGI with no AD to >15 to improve community ambulation tasks.    Baseline 11/10: 10/24; 02/11/20: 16/24 with single point cane, unable to perform without assistive device at this time;    Time 8    Period Weeks    Status On-going    Target Date 04/07/20                 Plan - 02/23/20 1010    Clinical Impression Statement Patient demonstrates excellent motivation during session today. Updated outcome measures with patient on 02/11/20 so deferred today. At that time her 31s Sit to Stand Test improved from 6 repetitions at initial evaluation to 11 repetitions. Her TUG also dropped from 14.7s to 11.9s. Her BERG only showed very minimal improvement in her balance improving from 45/56 at initial evaluation to 46/56. She is significant limited by hip abductor weakness  and low back pain when attempting single stance or stairs. She is still unable to perform the DGI without an assistive device however scored 16/24 with a single point cane. Pt has not achieved maximal benefit from physical therapy and would benefit from continued skilled PT services to address deficits in balance, strength, and pain in order to improve function at home and decrease her risk for falls.    Personal Factors and Comorbidities Age;Comorbidity 3+;Past/Current Experience    Examination-Activity Limitations Stand;Locomotion Level;Squat;Stairs    Examination-Participation Restrictions Community Activity    Stability/Clinical Decision Making Evolving/Moderate complexity    Clinical Decision Making Moderate    Rehab Potential Good    PT Frequency 2x / week    PT Duration 8 weeks    PT Treatment/Interventions ADLs/Self Care Home Management;DME Instruction;Gait training;Stair training;Functional mobility training;Therapeutic activities;Therapeutic exercise;Balance training;Neuromuscular re-education;Patient/family education;Energy conservation    PT Next Visit Plan progress note, balance and strength, hip abductor strengthening to improve trendelenburg during gait, work on reciprocal arm swing.    PT Home Exercise Plan Access Code: [email or web address]           Patient will benefit from skilled therapeutic intervention in order to improve the following deficits and impairments:  Abnormal gait,Pain,Improper body mechanics,Decreased mobility,Postural dysfunction,Decreased activity tolerance,Decreased endurance,Decreased strength,Decreased balance,Difficulty walking  Visit Diagnosis: Muscle weakness (generalized)  Other abnormalities of gait and mobility     Problem List Patient Active Problem List   Diagnosis Date Noted   Current use of long term anticoagulation 05/07/2019  Dizziness 05/07/2019   Hypertension 04/26/2015   Dry mouth 04/26/2015   Morbid obesity ([place]) 09/04/2014    Hypokalemia 08/12/2014   Allergic rhinitis 08/04/2014   Anxiety 08/04/2014   Cannot sleep 08/04/2014   Neuropathic pain 08/04/2014   Atrial fibrillation ([place]) 08/04/2014   Avitaminosis D 08/04/2014   Gastroesophageal reflux disease without esophagitis 11/07/2013   Polypharmacy 08/19/2013   Osteoarthritis 06/11/2013   Rheumatoid arthritis ([place]) 06/11/2013   Hypercholesteremia 05/12/2009   Benign essential tremor 05/12/2009   Prediabetes 08/26/2008   Adult hypothyroidism 08/26/2008   Menopausal symptom 08/26/2008   Lyndel Safe Lance Galas PT, DPT, GCS  Mckinnley Smithey 02/23/2020, 10:15 AM  [facility] [address] [place], [place], [address] Phone: [phone]   Fax:  [phone]  Name: Glenda Williams MRN: [id number] Date of Birth: 21-Oct-1940

## 2020-02-25 ENCOUNTER — Ambulatory Visit: Payer: Medicare Other

## 2020-02-25 DIAGNOSIS — I480 Paroxysmal atrial fibrillation: Secondary | ICD-10-CM | POA: Diagnosis not present

## 2020-02-25 DIAGNOSIS — G4752 REM sleep behavior disorder: Secondary | ICD-10-CM | POA: Diagnosis not present

## 2020-02-25 DIAGNOSIS — G250 Essential tremor: Secondary | ICD-10-CM | POA: Diagnosis not present

## 2020-03-03 DIAGNOSIS — Z23 Encounter for immunization: Secondary | ICD-10-CM | POA: Diagnosis not present

## 2020-03-08 ENCOUNTER — Ambulatory Visit: Payer: Medicare Other

## 2020-03-08 NOTE — Patient Instructions (Incomplete)
TREATMENT   Ther-ex Nu-Step L0 for 8 min LE's only during history for warm-up(seat position 9),74minutes unbilled; Seated marches x 10 BLE, added 3# ankle weights x 10 BLE; Seated LAQ un weighted x 10 BLE, added 3# ankle weights x 10 BLE; Seated clams with green tband 2 x 10 BLE; Seated adductor ball squeeze 3s hold 2 x 10 BLE;   Neuromuscular Re-education All exercises performed without UE support next to t unless otherwise specified: Alternating forward  6" step taps with BUE support x 10, decreased to single UE support and performed an additional 10, pt cued to focus on keep hips level to minimize/eliminiate hip drop; Alternating heel/toe raises without UE support x 10 each direction; NBOS eyes open/closed x 30s each; NBOS horizontal and vertical head turns x 30s each;   Pt educated throughout session about proper posture and technique with exercises. Improved exercise technique, movement at target joints, use of target muscles after min to mod verbal, visual, tactile cues.    Patient demonstrates excellent motivation during session today.Updated outcome measures with patient on 02/11/20 so deferred today. At that time her 30s Sit to Stand Test improved from 6 repetitions at initial evaluation to 11 repetitions. Her TUG also dropped from 14.7s to 11.9s. Her BERG only showed very minimal improvement in her balance improving from 45/56 at initial evaluation to 46/56. She is significant limited by hip abductor weakness and low back pain when attempting single stance or stairs. She is still unable to perform the DGI without anassistive devicehowever scored 16/24 with asingle point cane. Pt has not achieved maximal benefit from physical therapy and would benefit from continued skilled PT services to address deficits in balance, strength, and pain in order to improve function at home and decrease her risk for falls.

## 2020-03-09 NOTE — Patient Instructions (Incomplete)
TREATMENT   Ther-ex Nu-Step L0 for 8 min LE's only during history for warm-up(seat position 9),3minutes unbilled; Seated marches x 10 BLE, added 3# ankle weights x 10 BLE; Seated LAQ un weighted x 10 BLE, added 3# ankle weights x 10 BLE; Seated clams with green tband 2 x 10 BLE; Seated adductor ball squeeze 3s hold 2 x 10 BLE;   Neuromuscular Re-education All exercises performed without UE support next to t unless otherwise specified: Alternating forward  6" step taps with BUE support x 10, decreased to single UE support and performed an additional 10, pt cued to focus on keep hips level to minimize/eliminiate hip drop; Alternating heel/toe raises without UE support x 10 each direction; NBOS eyes open/closed x 30s each; NBOS horizontal and vertical head turns x 30s each;   Pt educated throughout session about proper posture and technique with exercises. Improved exercise technique, movement at target joints, use of target muscles after min to mod verbal, visual, tactile cues.    Patient demonstrates excellent motivation during session today.Updated outcome measures with patient on 02/11/20 so deferred today. At that time her 30s Sit to Stand Test improved from 6 repetitions at initial evaluation to 11 repetitions. Her TUG also dropped from 14.7s to 11.9s. Her BERG only showed very minimal improvement in her balance improving from 45/56 at initial evaluation to 46/56. She is significant limited by hip abductor weakness and low back pain when attempting single stance or stairs. She is still unable to perform the DGI without anassistive devicehowever scored 16/24 with asingle point cane. Pt has not achieved maximal benefit from physical therapy and would benefit from continued skilled PT services to address deficits in balance, strength, and pain in order to improve function at home and decrease her risk for falls.   

## 2020-03-10 ENCOUNTER — Ambulatory Visit: Payer: Medicare Other

## 2020-03-10 DIAGNOSIS — R2689 Other abnormalities of gait and mobility: Secondary | ICD-10-CM

## 2020-03-10 DIAGNOSIS — M6281 Muscle weakness (generalized): Secondary | ICD-10-CM

## 2020-03-12 ENCOUNTER — Ambulatory Visit: Payer: Medicare Other | Attending: Neurology

## 2020-03-12 ENCOUNTER — Other Ambulatory Visit: Payer: Self-pay

## 2020-03-12 DIAGNOSIS — M6281 Muscle weakness (generalized): Secondary | ICD-10-CM | POA: Insufficient documentation

## 2020-03-12 DIAGNOSIS — R2689 Other abnormalities of gait and mobility: Secondary | ICD-10-CM | POA: Diagnosis not present

## 2020-03-12 NOTE — Therapy (Signed)
[place] [facility] [address] [place], [place], [address] Phone: [phone]   Fax:  [phone]  Physical Therapy Treatment  Patient Details  Name: Glenda Williams MRN: [id number] Date of Birth: September 22, 1940 Referring Provider (PT): Vladimir Crofts, MD   Encounter Date: 03/12/2020   PT End of Session - 03/12/20 0848    Visit Number 11    Number of Visits 25    Date for PT Re-Evaluation 04/07/20    PT Start Time 0835    PT Stop Time 0900    PT Time Calculation (min) 25 min    Equipment Utilized During Treatment Gait belt    Activity Tolerance Patient tolerated treatment well    Behavior During Therapy [facility] for tasks assessed/performed           Past Medical History:  Diagnosis Date  . A-fib ([place])   . Arthritis   . Bell palsy 09/04/2014  . GERD (gastroesophageal reflux disease)   . Heart disease   . Rheumatoid arthritis ([place])   . Thyroid disease   . Tremors of nervous system     Past Surgical History:  Procedure Laterality Date  . BREAST BIOPSY Left 1988   Benign  . BREAST BIOPSY Left 1987   Benign  . BREAST EXCISIONAL BIOPSY    . BROW LIFT Bilateral 05/23/2016   Procedure: BLEPHAROPLASTY upper eyelid; w/excess skin;  Surgeon: Karle Starch, MD;  Location: [place];  Service: Ophthalmology;  Laterality: Bilateral;  . CARPAL TUNNEL RELEASE Bilateral   . CHOLECYSTECTOMY     Dr. Bary Castilla  . COLONOSCOPY  2006  . JOINT REPLACEMENT Bilateral    knees  . REPLACEMENT TOTAL KNEE Left 2005  . REPLACEMENT TOTAL KNEE Right 1998    There were no vitals filed for this visit.   Subjective Assessment - 03/12/20 0846    Subjective Pt reports that she is doing alright today. Denies any focal pain upon arrival today. No specific questions currently.    Pertinent History Pt is a pleasant 80 y.o. female referred to PT for parkinsonisms and imbalance. Pt has a PMH of Afib, RA, valvular disease, HTN, hypercholesterolemia, and obesity.  Pt reports balance difficulties in the past 6-8 months with 2 falls falling in her office at her home tripping over a rug falling into the corner of her desk. Pt feels most off balance when she is tired or stressed she begins feeling unsteady. Pt denies dizziness or lightheadedness. Pt believes she has better balance without shoes on but has new SAS shoes that have been helpful for her. Pt displays difficulty walking on unstable surfaces like grass and pt has difficulty with stairs. Pt lives in a [facility] with flat entrance and accessible shower and elevated toilet with 42" doorways with wide hallways and lives with her husband. Pt's goal with PT is to improve her walking abilities and endurance.    Limitations House hold activities;Lifting;Walking    How long can you stand comfortably? 1 hour    How long can you walk comfortably? 15-20 min depending on walking surface.    Patient Stated Goals Improve walking endurance.    Currently in Pain? No/denies    Pain Onset --              TREATMENT   Ther-ex Nu-Step L2 for 12 min LE's only during history for warm-up(seat position 9), unbilled; Seated marches with green tband around knees 2 x 15; Seated clams with green tband  2 x 15 BLE; Seated adductor ball squeeze 3s hold 2 x 15  BLE; Seated LAQ un weighted 2 x 15 BLE; Seated heel raises with manual resistance 2x15 BLE;   Pt educated throughout session about proper posture and technique with exercises. Improved exercise technique, movement at target joints, use of target muscles after min to mod verbal, visual, tactile cues.    Patient arrived early but reported she needed to leave by 9:00. Due to scheduling constraints therapist unabe to start session until scheduled time however pt performs additional unbilled time on the NuStep. Restarted strengthening exercises with patient today after a prolonged absence from physical therapy. She demonstrates excellent motivation during session today.  Will continue to progress strengthening and balance exercises at follow-up sessions. Pt has not achieved maximal benefit from physical therapy and would benefit from continued skilled PT services to address deficits in balance, strength, and pain in order to improve function at home and decrease her risk for falls.                              PT Long Term Goals - 02/23/20 0929      PT LONG TERM GOAL #1   Title Pt will improve FOTO score to 60 to display significant improvements in functional mobility.    Baseline 11/8: 48; 02/11/20: 40    Time 8    Period Weeks    Status On-going    Target Date 04/07/20      PT LONG TERM GOAL #2   Title Pt will improve 30 sec STS to 13 reps to match appropriate age ranges for clinically significant LE strength for comunity level ADL's    Baseline 11/8: 6 reps, no UE support; 02/11/20: 11 reps, no UE support    Time 8    Period Weeks    Status Partially Met    Target Date 04/07/20      PT LONG TERM GOAL #3   Title Pt will improve TUG with SPC to < 12 sec to display decreased risk of falls.    Baseline 11/8: 14. 66 sec; 02/11/20: 11.9s    Time 4    Period Weeks    Status Achieved    Target Date 04/07/20      PT LONG TERM GOAL #4   Title Pt will improve Berg to > 52/56 to display significant decrease in falls risk with home and community tasks.    Baseline 11/8: 45/56; 02/11/20: 46/56    Time 8    Period Weeks    Status Partially Met    Target Date 04/07/20      PT LONG TERM GOAL #5   Title Pt will asc/desc full flight of stairs with SPC and single handrail and reciprocal pattern with < 2/10 pain via NPS to improve community level tasks.    Baseline 11/8: Asc/desc 4 stairs with reciprocal pattern, SPC, and handrail with up to 5/10 NPS and reports of difficulty. Good safety. 02/11/20 reciprocal with BUE support, able to perform with single point cane as well with single rail, pain increases in back to 5/10    Time 8     Period Weeks    Status On-going    Target Date 04/07/20      PT LONG TERM GOAL #6   Title Pt will improve DGI with no AD to >15 to improve community ambulation tasks.    Baseline 11/10: 10/24; 02/11/20: 16/24  with single point cane, unable to perform without assistive device at this time;    Time 8    Period Weeks    Status On-going    Target Date 04/07/20                 Plan - 03/12/20 0848    Clinical Impression Statement Patient arrived early but reported she needed to leave by 9:00. Due to scheduling constraints therapist unabe to start session until scheduled time however pt performs additional unbilled time on the NuStep. Restarted strengthening exercises with patient today after a prolonged absence from physical therapy. She demonstrates excellent motivation during session today. Will continue to progress strengthening and balance exercises at follow-up sessions. Pt has not achieved maximal benefit from physical therapy and would benefit from continued skilled PT services to address deficits in balance, strength, and pain in order to improve function at home and decrease her risk for falls.    Personal Factors and Comorbidities Age;Comorbidity 3+;Past/Current Experience    Examination-Activity Limitations Stand;Locomotion Level;Squat;Stairs    Examination-Participation Restrictions Community Activity    Stability/Clinical Decision Making Evolving/Moderate complexity    Rehab Potential Good    PT Frequency 2x / week    PT Duration 8 weeks    PT Treatment/Interventions ADLs/Self Care Home Management;DME Instruction;Gait training;Stair training;Functional mobility training;Therapeutic activities;Therapeutic exercise;Balance training;Neuromuscular re-education;Patient/family education;Energy conservation    PT Next Visit Plan progress note, balance and strength, hip abductor strengthening to improve trendelenburg during gait, work on reciprocal arm swing.    PT Home Exercise Plan  Access Code: [email or web address]           Patient will benefit from skilled therapeutic intervention in order to improve the following deficits and impairments:  Abnormal gait,Pain,Improper body mechanics,Decreased mobility,Postural dysfunction,Decreased activity tolerance,Decreased endurance,Decreased strength,Decreased balance,Difficulty walking  Visit Diagnosis: Muscle weakness (generalized)  Other abnormalities of gait and mobility     Problem List Patient Active Problem List   Diagnosis Date Noted  . Current use of long term anticoagulation 05/07/2019  . Dizziness 05/07/2019  . Hypertension 04/26/2015  . Dry mouth 04/26/2015  . Morbid obesity ([place]) 09/04/2014  . Hypokalemia 08/12/2014  . Allergic rhinitis 08/04/2014  . Anxiety 08/04/2014  . Cannot sleep 08/04/2014  . Neuropathic pain 08/04/2014  . Atrial fibrillation ([place]) 08/04/2014  . Avitaminosis D 08/04/2014  . Gastroesophageal reflux disease without esophagitis 11/07/2013  . Polypharmacy 08/19/2013  . Osteoarthritis 06/11/2013  . Rheumatoid arthritis ([place]) 06/11/2013  . Hypercholesteremia 05/12/2009  . Benign essential tremor 05/12/2009  . Prediabetes 08/26/2008  . Adult hypothyroidism 08/26/2008  . Menopausal symptom 08/26/2008   Phillips Grout PT, DPT, GCS  Jared Cahn 03/12/2020, 9:39 AM  [place] [facility] [address] [place], [place], [address] Phone: [phone]   Fax:  [phone]  Name: ALAJIAH DUTKIEWICZ MRN: [id number] Date of Birth: 1940-04-28

## 2020-03-15 ENCOUNTER — Ambulatory Visit: Payer: Medicare Other

## 2020-03-15 ENCOUNTER — Other Ambulatory Visit: Payer: Self-pay

## 2020-03-15 DIAGNOSIS — M6281 Muscle weakness (generalized): Secondary | ICD-10-CM | POA: Diagnosis not present

## 2020-03-15 DIAGNOSIS — R2689 Other abnormalities of gait and mobility: Secondary | ICD-10-CM

## 2020-03-15 NOTE — Therapy (Signed)
[place] [facility] [address] [place], [place], [address] Phone: [phone]   Fax:  [phone]  Physical Therapy Treatment  Patient Details  Name: Glenda Williams MRN: [id number] Date of Birth: 26-Oct-1940 Referring Provider (PT): Vladimir Crofts, MD   Encounter Date: 03/15/2020   PT End of Session - 03/15/20 1447    Visit Number 12    Number of Visits 25    Date for PT Re-Evaluation 04/07/20    PT Start Time 3748    PT Stop Time 1515    PT Time Calculation (min) 32 min    Equipment Utilized During Treatment Gait belt    Activity Tolerance Patient tolerated treatment well    Behavior During Therapy [facility] for tasks assessed/performed           Past Medical History:  Diagnosis Date  . A-fib ([place])   . Arthritis   . Bell palsy 09/04/2014  . GERD (gastroesophageal reflux disease)   . Heart disease   . Rheumatoid arthritis ([place])   . Thyroid disease   . Tremors of nervous system     Past Surgical History:  Procedure Laterality Date  . BREAST BIOPSY Left 1988   Benign  . BREAST BIOPSY Left 1987   Benign  . BREAST EXCISIONAL BIOPSY    . BROW LIFT Bilateral 05/23/2016   Procedure: BLEPHAROPLASTY upper eyelid; w/excess skin;  Surgeon: Karle Starch, MD;  Location: [place];  Service: Ophthalmology;  Laterality: Bilateral;  . CARPAL TUNNEL RELEASE Bilateral   . CHOLECYSTECTOMY     Dr. Bary Castilla  . COLONOSCOPY  2006  . JOINT REPLACEMENT Bilateral    knees  . REPLACEMENT TOTAL KNEE Left 2005  . REPLACEMENT TOTAL KNEE Right 1998    There were no vitals filed for this visit.   Subjective Assessment - 03/15/20 1446    Subjective Pt reports that she is doing alright today. Denies any focal pain upon arrival today. No specific questions currently.    Pertinent History Pt is a pleasant 80 y.o. female referred to PT for parkinsonisms and imbalance. Pt has a PMH of Afib, RA, valvular disease, HTN, hypercholesterolemia, and obesity.  Pt reports balance difficulties in the past 6-8 months with 2 falls falling in her office at her home tripping over a rug falling into the corner of her desk. Pt feels most off balance when she is tired or stressed she begins feeling unsteady. Pt denies dizziness or lightheadedness. Pt believes she has better balance without shoes on but has new SAS shoes that have been helpful for her. Pt displays difficulty walking on unstable surfaces like grass and pt has difficulty with stairs. Pt lives in a [facility] with flat entrance and accessible shower and elevated toilet with 42" doorways with wide hallways and lives with her husband. Pt's goal with PT is to improve her walking abilities and endurance.    Limitations House hold activities;Lifting;Walking    How long can you stand comfortably? 1 hour    How long can you walk comfortably? 15-20 min depending on walking surface.    Patient Stated Goals Improve walking endurance.    Currently in Pain? No/denies               TREATMENT   Ther-ex Nu-Step L2-4 for 5 min LE's only during history for warm-up(seat position 11), 5 minutes unbilled; Seatedmarches with green tband around knees 2 x 15; Seatedclams withgreen tband3s hold x 15 BLE; Seated adductor  ball squeeze 3s hold 2 x 15  BLE; Seated LAQunweighted x 15 BLE; Standing heel raises with BUE support x15 BLE; Standing hip abduction unweighted with BUE support x 15 BLE;   Neuromuscular Re-education All exercises performed without UE support next to t unless otherwise specified: Alternating forward  6" step taps with faded BUE support x 20, pt cued to utilize mirror to focus on keep hips level to minimize/eliminiate hip drop; NBOS eyes open/closed x 30s each; NBOS horizontal and vertical head turns x 30s each;    Pt educated throughout session about proper posture and technique with exercises. Improved exercise technique, movement at target joints, use of target muscles after min to  mod verbal, visual, tactile cues.    Patient arrived late for therapy session so treatment abbreviated accordingly due to scheduling constraints. Continued with LE strengthening and started balance exercises again. She demonstrates excellent motivation during session today. Will continue to progress strengthening and balance exercises at follow-up sessions. Pt has not achieved maximal benefit from physical therapy and would benefit from continued skilled PT services to address deficits in balance, strength, and pain in order to improve function at home and decrease her risk for falls.                              PT Long Term Goals - 02/23/20 0929      PT LONG TERM GOAL #1   Title Pt will improve FOTO score to 60 to display significant improvements in functional mobility.    Baseline 11/8: 48; 02/11/20: 40    Time 8    Period Weeks    Status On-going    Target Date 04/07/20      PT LONG TERM GOAL #2   Title Pt will improve 30 sec STS to 13 reps to match appropriate age ranges for clinically significant LE strength for comunity level ADL's    Baseline 11/8: 6 reps, no UE support; 02/11/20: 11 reps, no UE support    Time 8    Period Weeks    Status Partially Met    Target Date 04/07/20      PT LONG TERM GOAL #3   Title Pt will improve TUG with SPC to < 12 sec to display decreased risk of falls.    Baseline 11/8: 14. 66 sec; 02/11/20: 11.9s    Time 4    Period Weeks    Status Achieved    Target Date 04/07/20      PT LONG TERM GOAL #4   Title Pt will improve Berg to > 52/56 to display significant decrease in falls risk with home and community tasks.    Baseline 11/8: 45/56; 02/11/20: 46/56    Time 8    Period Weeks    Status Partially Met    Target Date 04/07/20      PT LONG TERM GOAL #5   Title Pt will asc/desc full flight of stairs with SPC and single handrail and reciprocal pattern with < 2/10 pain via NPS to improve community level tasks.     Baseline 11/8: Asc/desc 4 stairs with reciprocal pattern, SPC, and handrail with up to 5/10 NPS and reports of difficulty. Good safety. 02/11/20 reciprocal with BUE support, able to perform with single point cane as well with single rail, pain increases in back to 5/10    Time 8    Period Weeks    Status On-going  Target Date 04/07/20      PT LONG TERM GOAL #6   Title Pt will improve DGI with no AD to >15 to improve community ambulation tasks.    Baseline 11/10: 10/24; 02/11/20: 16/24 with single point cane, unable to perform without assistive device at this time;    Time 8    Period Weeks    Status On-going    Target Date 04/07/20                 Plan - 03/15/20 1448    Clinical Impression Statement Patient arrived late for therapy session so treatment abbreviated accordingly due to scheduling constraints. Continued with LE strengthening and started balance exercises again. She demonstrates excellent motivation during session today. Will continue to progress strengthening and balance exercises at follow-up sessions. Pt has not achieved maximal benefit from physical therapy and would benefit from continued skilled PT services to address deficits in balance, strength, and pain in order to improve function at home and decrease her risk for falls.    Personal Factors and Comorbidities Age;Comorbidity 3+;Past/Current Experience    Examination-Activity Limitations Stand;Locomotion Level;Squat;Stairs    Examination-Participation Restrictions Community Activity    Stability/Clinical Decision Making Evolving/Moderate complexity    Rehab Potential Good    PT Frequency 2x / week    PT Duration 8 weeks    PT Treatment/Interventions ADLs/Self Care Home Management;DME Instruction;Gait training;Stair training;Functional mobility training;Therapeutic activities;Therapeutic exercise;Balance training;Neuromuscular re-education;Patient/family education;Energy conservation    PT Next Visit Plan balance  and strength, hip abductor strengthening to improve trendelenburg during gait, work on reciprocal arm swing.    PT Home Exercise Plan Access Code: [email or web address]           Patient will benefit from skilled therapeutic intervention in order to improve the following deficits and impairments:  Abnormal gait,Pain,Improper body mechanics,Decreased mobility,Postural dysfunction,Decreased activity tolerance,Decreased endurance,Decreased strength,Decreased balance,Difficulty walking  Visit Diagnosis: Muscle weakness (generalized)  Other abnormalities of gait and mobility     Problem List Patient Active Problem List   Diagnosis Date Noted  . Current use of long term anticoagulation 05/07/2019  . Dizziness 05/07/2019  . Hypertension 04/26/2015  . Dry mouth 04/26/2015  . Morbid obesity ([place]) 09/04/2014  . Hypokalemia 08/12/2014  . Allergic rhinitis 08/04/2014  . Anxiety 08/04/2014  . Cannot sleep 08/04/2014  . Neuropathic pain 08/04/2014  . Atrial fibrillation ([place]) 08/04/2014  . Avitaminosis D 08/04/2014  . Gastroesophageal reflux disease without esophagitis 11/07/2013  . Polypharmacy 08/19/2013  . Osteoarthritis 06/11/2013  . Rheumatoid arthritis ([place]) 06/11/2013  . Hypercholesteremia 05/12/2009  . Benign essential tremor 05/12/2009  . Prediabetes 08/26/2008  . Adult hypothyroidism 08/26/2008  . Menopausal symptom 08/26/2008   Phillips Grout PT, DPT, GCS  , 03/15/2020, 3:26 PM  [place] [facility] [address] [place], [place], [address] Phone: [phone]   Fax:  [phone]  Name: Glenda Williams MRN: [id number] Date of Birth: [birth year]

## 2020-03-16 DIAGNOSIS — M0579 Rheumatoid arthritis with rheumatoid factor of multiple sites without organ or systems involvement: Secondary | ICD-10-CM | POA: Diagnosis not present

## 2020-03-22 ENCOUNTER — Ambulatory Visit: Payer: Medicare Other

## 2020-03-24 DIAGNOSIS — R0602 Shortness of breath: Secondary | ICD-10-CM | POA: Diagnosis not present

## 2020-03-24 DIAGNOSIS — I480 Paroxysmal atrial fibrillation: Secondary | ICD-10-CM | POA: Diagnosis not present

## 2020-03-29 ENCOUNTER — Other Ambulatory Visit: Payer: Self-pay

## 2020-03-29 ENCOUNTER — Ambulatory Visit: Payer: Medicare Other

## 2020-03-29 VITALS — BP 143/50 | HR 60

## 2020-03-29 DIAGNOSIS — M6281 Muscle weakness (generalized): Secondary | ICD-10-CM | POA: Diagnosis not present

## 2020-03-29 DIAGNOSIS — R2689 Other abnormalities of gait and mobility: Secondary | ICD-10-CM

## 2020-03-29 NOTE — Therapy (Signed)
[facility] [address] [place], [place], [address] Phone: [phone]   Fax:  [phone]  Physical Therapy Treatment  Patient Details  Name: Glenda Williams MRN: [id number] Date of Birth: 1940/05/02 Referring Provider (PT): Vladimir Crofts, MD   Encounter Date: 03/29/2020   PT End of Session - 03/29/20 1443    Visit Number 13    Number of Visits 25    Date for PT Re-Evaluation 04/07/20    PT Start Time 3614    PT Stop Time 1515    PT Time Calculation (min) 41 min    Equipment Utilized During Treatment Gait belt    Activity Tolerance Patient tolerated treatment well    Behavior During Therapy [facility] for tasks assessed/performed           Past Medical History:  Diagnosis Date  . A-fib ([place])   . Arthritis   . Bell palsy 09/04/2014  . GERD (gastroesophageal reflux disease)   . Heart disease   . Rheumatoid arthritis ([place])   . Thyroid disease   . Tremors of nervous system     Past Surgical History:  Procedure Laterality Date  . BREAST BIOPSY Left 1988   Benign  . BREAST BIOPSY Left 1987   Benign  . BREAST EXCISIONAL BIOPSY    . BROW LIFT Bilateral 05/23/2016   Procedure: BLEPHAROPLASTY upper eyelid; w/excess skin;  Surgeon: Karle Starch, MD;  Location: [facility];  Service: Ophthalmology;  Laterality: Bilateral;  . CARPAL TUNNEL RELEASE Bilateral   . CHOLECYSTECTOMY     Dr. Bary Castilla  . COLONOSCOPY  2006  . JOINT REPLACEMENT Bilateral    knees  . REPLACEMENT TOTAL KNEE Left 2005  . REPLACEMENT TOTAL KNEE Right 1998    Vitals:   03/29/20 1437  BP: (!) 143/50  Pulse: 60  SpO2: 98%     Subjective Assessment - 03/29/20 1438    Subjective Pt reports that she is doing alright today. Denies any focal pain upon arrival today. No specific questions currently.    Pertinent History Pt is a pleasant 80 y.o. female referred to PT for parkinsonisms and imbalance. Pt has a PMH of Afib, RA, valvular disease, HTN,  hypercholesterolemia, and obesity. Pt reports balance difficulties in the past 6-8 months with 2 falls falling in her office at her home tripping over a rug falling into the corner of her desk. Pt feels most off balance when she is tired or stressed she begins feeling unsteady. Pt denies dizziness or lightheadedness. Pt believes she has better balance without shoes on but has new SAS shoes that have been helpful for her. Pt displays difficulty walking on unstable surfaces like grass and pt has difficulty with stairs. Pt lives in a [facility] with flat entrance and accessible shower and elevated toilet with 42" doorways with wide hallways and lives with her husband. Pt's goal with PT is to improve her walking abilities and endurance.    Limitations House hold activities;Lifting;Walking    How long can you stand comfortably? 1 hour    How long can you walk comfortably? 15-20 min depending on walking surface.    Patient Stated Goals Improve walking endurance.    Currently in Pain? No/denies             TREATMENT   Ther-ex Nu-Step [id number] 556m LE's only during history for warm-up(seat position 11), 5 minutes unbilled; Seatedmarcheswith green tband around knees 2 x 20; Seatedclams withgreen tband3s hold 2  x 20BLE; Seated adductor ball squeeze 3s hold2 x 20BLE; Seated LAQunweightedx 20BLE; Standing heel raises with BUE support x 20 BLE; Standing hip abduction unweighted with BUE support x 20 BLE; Standing mini squats x 10;   Neuromuscular Re-education All exercises performed without UE support next to t unless otherwise specified: NBOS eyes open/closed x 30s each; NBOS horizontal and vertical head turns x 30s each; Airex NBOS eyes open/closed x 30s each; Airex NBOS horizontal and vertical head turns x 30s each; Alternating 6" hurdle step-over with faded UE support, pt unable to remove hands completely but able to perform with single UE support x 10 on each side;   Pt  educated throughout session about proper posture and technique with exercises. Improved exercise technique, movement at target joints, use of target muscles after min to mod verbal, visual, tactile cues.    Continued with LE strengthening and balance exercises today. Shedemonstrates excellent motivation during session. She continues to struggle with single leg balance and is unable to fully remove support during hurdle steps.Will continue to progress strengthening and balance exercises at follow-up sessions.Encouraged pt to continue HEP. She has not achieved maximal benefit from physical therapy and would benefit from continued skilled PT services to address deficits in balance, strength, and pain in order to improve function at home and decrease her risk for falls.                              PT Long Term Goals - 02/23/20 0929      PT LONG TERM GOAL #1   Title Pt will improve FOTO score to 60 to display significant improvements in functional mobility.    Baseline 11/8: 48; 02/11/20: 40    Time 8    Period Weeks    Status On-going    Target Date 04/07/20      PT LONG TERM GOAL #2   Title Pt will improve 30 sec STS to 13 reps to match appropriate age ranges for clinically significant LE strength for comunity level ADL's    Baseline 11/8: 6 reps, no UE support; 02/11/20: 11 reps, no UE support    Time 8    Period Weeks    Status Partially Met    Target Date 04/07/20      PT LONG TERM GOAL #3   Title Pt will improve TUG with SPC to < 12 sec to display decreased risk of falls.    Baseline 11/8: 14. 66 sec; 02/11/20: 11.9s    Time 4    Period Weeks    Status Achieved    Target Date 04/07/20      PT LONG TERM GOAL #4   Title Pt will improve Berg to > 52/56 to display significant decrease in falls risk with home and community tasks.    Baseline 11/8: 45/56; 02/11/20: 46/56    Time 8    Period Weeks    Status Partially Met    Target Date 04/07/20      PT  LONG TERM GOAL #5   Title Pt will asc/desc full flight of stairs with SPC and single handrail and reciprocal pattern with < 2/10 pain via NPS to improve community level tasks.    Baseline 11/8: Asc/desc 4 stairs with reciprocal pattern, SPC, and handrail with up to 5/10 NPS and reports of difficulty. Good safety. 02/11/20 reciprocal with BUE support, able to perform with single point cane as well with single  rail, pain increases in back to 5/10    Time 8    Period Weeks    Status On-going    Target Date 04/07/20      PT LONG TERM GOAL #6   Title Pt will improve DGI with no AD to >15 to improve community ambulation tasks.    Baseline 11/10: 10/24; 02/11/20: 16/24 with single point cane, unable to perform without assistive device at this time;    Time 8    Period Weeks    Status On-going    Target Date 04/07/20                 Plan - 03/29/20 1448    Clinical Impression Statement Continued with LE strengthening and balance exercises today. She demonstrates excellent motivation during session. She continues to struggle with single leg balance and is unable to fully remove support during hurdle steps. Will continue to progress strengthening and balance exercises at follow-up sessions. Encouraged pt to continue HEP. She has not achieved maximal benefit from physical therapy and would benefit from continued skilled PT services to address deficits in balance, strength, and pain in order to improve function at home and decrease her risk for falls.    Personal Factors and Comorbidities Age;Comorbidity 3+;Past/Current Experience    Examination-Activity Limitations Stand;Locomotion Level;Squat;Stairs    Examination-Participation Restrictions Community Activity    Stability/Clinical Decision Making Evolving/Moderate complexity    Rehab Potential Good    PT Frequency 2x / week    PT Duration 8 weeks    PT Treatment/Interventions ADLs/Self Care Home Management;DME Instruction;Gait training;Stair  training;Functional mobility training;Therapeutic activities;Therapeutic exercise;Balance training;Neuromuscular re-education;Patient/family education;Energy conservation    PT Next Visit Plan balance and strength, hip abductor strengthening to improve trendelenburg during gait, work on reciprocal arm swing.    PT Home Exercise Plan Access Code: [email or web address]           Patient will benefit from skilled therapeutic intervention in order to improve the following deficits and impairments:  Abnormal gait,Pain,Improper body mechanics,Decreased mobility,Postural dysfunction,Decreased activity tolerance,Decreased endurance,Decreased strength,Decreased balance,Difficulty walking  Visit Diagnosis: Muscle weakness (generalized)  Other abnormalities of gait and mobility     Problem List Patient Active Problem List   Diagnosis Date Noted  . Current use of long term anticoagulation 05/07/2019  . Dizziness 05/07/2019  . Hypertension 04/26/2015  . Dry mouth 04/26/2015  . Morbid obesity ([place]) 09/04/2014  . Hypokalemia 08/12/2014  . Allergic rhinitis 08/04/2014  . Anxiety 08/04/2014  . Cannot sleep 08/04/2014  . Neuropathic pain 08/04/2014  . Atrial fibrillation ([place]) 08/04/2014  . Avitaminosis D 08/04/2014  . Gastroesophageal reflux disease without esophagitis 11/07/2013  . Polypharmacy 08/19/2013  . Osteoarthritis 06/11/2013  . Rheumatoid arthritis ([place]) 06/11/2013  . Hypercholesteremia 05/12/2009  . Benign essential tremor 05/12/2009  . Prediabetes 08/26/2008  . Adult hypothyroidism 08/26/2008  . Menopausal symptom 08/26/2008   Phillips Grout PT, DPT, GCS  Danni Shima 03/30/2020, 10:12 AM  [facility] [address] [place], [place], [address] Phone: [phone]   Fax:  [phone]  Name: Glenda Williams MRN: [id number] Date of Birth: [birth year]

## 2020-03-31 ENCOUNTER — Ambulatory Visit: Payer: Medicare Other

## 2020-04-05 ENCOUNTER — Other Ambulatory Visit: Payer: Self-pay

## 2020-04-05 ENCOUNTER — Ambulatory Visit: Payer: Medicare Other

## 2020-04-05 DIAGNOSIS — M6281 Muscle weakness (generalized): Secondary | ICD-10-CM

## 2020-04-05 DIAGNOSIS — R2689 Other abnormalities of gait and mobility: Secondary | ICD-10-CM | POA: Diagnosis not present

## 2020-04-05 NOTE — Therapy (Signed)
[facility] [address] [place], [place], [address] Phone: [phone]   Fax:  [phone]  Physical Therapy Treatment  Patient Details  Name: Glenda Williams MRN: [id number] Date of Birth: 01-11-1941 Referring Provider (PT): Vladimir Crofts, MD   Encounter Date: 04/05/2020   PT End of Session - 04/05/20 1358    Visit Number 14    Number of Visits 25    Date for PT Re-Evaluation 04/07/20    PT Start Time 6440    PT Stop Time 1429    PT Time Calculation (min) 40 min    Activity Tolerance Patient tolerated treatment well;No increased pain    Behavior During Therapy WFL for tasks assessed/performed           Past Medical History:  Diagnosis Date  . A-fib ([place])   . Arthritis   . Bell palsy 09/04/2014  . GERD (gastroesophageal reflux disease)   . Heart disease   . Rheumatoid arthritis ([place])   . Thyroid disease   . Tremors of nervous system     Past Surgical History:  Procedure Laterality Date  . BREAST BIOPSY Left 1988   Benign  . BREAST BIOPSY Left 1987   Benign  . BREAST EXCISIONAL BIOPSY    . BROW LIFT Bilateral 05/23/2016   Procedure: BLEPHAROPLASTY upper eyelid; w/excess skin;  Surgeon: Karle Starch, MD;  Location: [facility];  Service: Ophthalmology;  Laterality: Bilateral;  . CARPAL TUNNEL RELEASE Bilateral   . CHOLECYSTECTOMY     Dr. Bary Castilla  . COLONOSCOPY  2006  . JOINT REPLACEMENT Bilateral    knees  . REPLACEMENT TOTAL KNEE Left 2005  . REPLACEMENT TOTAL KNEE Right 1998    There were no vitals filed for this visit.   Subjective Assessment - 04/05/20 1352    Subjective Pt doing well today, has been working on HEP. Pt reports no medical or medication updated. Pt has been lightheaded most of day today, unclear why, but not completely abnormal for her.    Pertinent History Pt is a pleasant 80 y.o. female referred to PT for parkinsonisms and imbalance. Pt has a PMH of Afib, RA, valvular disease, HTN,  hypercholesterolemia, and obesity. Pt reports balance difficulties in the past 6-8 months with 2 falls falling in her office at her home tripping over a rug falling into the corner of her desk. Pt feels most off balance when she is tired or stressed she begins feeling unsteady. Pt denies dizziness or lightheadedness. Pt believes she has better balance without shoes on but has new SAS shoes that have been helpful for her. Pt displays difficulty walking on unstable surfaces like grass and pt has difficulty with stairs. Pt lives in a [facility] with flat entrance and accessible shower and elevated toilet with 42" doorways with wide hallways and lives with her husband. Pt's goal with PT is to improve her walking abilities and endurance.    How long can you stand comfortably? 1 hour    How long can you walk comfortably? 15-20 min depending on walking surface.    Patient Stated Goals Improve walking endurance.    Currently in Pain? No/denies    Pain Score 3     Pain Location --   Rt shoulder/back         Vitals assessment, pt lightheaded much of day:  -135/57 56 bpm seated  -130/56 56 bpm standing   INTERVENTION THIS DATE: -STS from chair + airex, hands  free, supervision level, no LOB 1x11 -Narrow stance eyes closed 2x30sec -Narrow stance horizontal head turns 1x10 bilat -Narrow stance vertical head turns 1x15  -Narrow stance airex eyes closed 2x30sec  -Narrow stance airex head turns 1x15 -Nustep  Seat 9, BLE only, 5 minutes level 2  -LAQ 2x10 bilat, 2lb AW, 4lb AW  -Marching 1x10 bilat (cues to aim for shoulder rather than chin)  -Green TB clams seated 1x15        PT Long Term Goals - 02/23/20 0929      PT LONG TERM GOAL #1   Title Pt will improve FOTO score to 60 to display significant improvements in functional mobility.    Baseline 11/8: 48; 02/11/20: 40    Time 8    Period Weeks    Status On-going    Target Date 04/07/20      PT LONG TERM GOAL #2   Title Pt will improve 30 sec STS to  13 reps to match appropriate age ranges for clinically significant LE strength for comunity level ADL's    Baseline 11/8: 6 reps, no UE support; 02/11/20: 11 reps, no UE support    Time 8    Period Weeks    Status Partially Met    Target Date 04/07/20      PT LONG TERM GOAL #3   Title Pt will improve TUG with SPC to < 12 sec to display decreased risk of falls.    Baseline 11/8: 14. 66 sec; 02/11/20: 11.9s    Time 4    Period Weeks    Status Achieved    Target Date 04/07/20      PT LONG TERM GOAL #4   Title Pt will improve Berg to > 52/56 to display significant decrease in falls risk with home and community tasks.    Baseline 11/8: 45/56; 02/11/20: 46/56    Time 8    Period Weeks    Status Partially Met    Target Date 04/07/20      PT LONG TERM GOAL #5   Title Pt will asc/desc full flight of stairs with SPC and single handrail and reciprocal pattern with < 2/10 pain via NPS to improve community level tasks.    Baseline 11/8: Asc/desc 4 stairs with reciprocal pattern, SPC, and handrail with up to 5/10 NPS and reports of difficulty. Good safety. 02/11/20 reciprocal with BUE support, able to perform with single point cane as well with single rail, pain increases in back to 5/10    Time 8    Period Weeks    Status On-going    Target Date 04/07/20      PT LONG TERM GOAL #6   Title Pt will improve DGI with no AD to >15 to improve community ambulation tasks.    Baseline 11/10: 10/24; 02/11/20: 16/24 with single point cane, unable to perform without assistive device at this time;    Time 8    Period Weeks    Status On-going    Target Date 04/07/20                 Plan - 04/05/20 1402    Clinical Impression Statement Continued with current plan of care as laid out in evaluation and recent prior sessions. Progressed from theraband to ankle weights on some. Pt has some dizziness in session that is stable, no relationship with activity in session. Pt remains motivated to advance  progress toward goals. Rest breaks provided as needed, pt quick to ask  when needed. Author maintains all interventions within appropriate level of intensity as not to purposefully exacerbate pain. Pt does require varying levels of assistance and cuing for completion of exercises for correct form and sometimes due to pain/weakness. Pt continues to demonstrate progress toward goals AEB progression of some interventions this date either in volume or intensity. No updates to HEP this date.    Personal Factors and Comorbidities Age;Comorbidity 3+;Past/Current Experience    Examination-Activity Limitations Stand;Locomotion Level;Squat;Stairs    Examination-Participation Restrictions Community Activity    Stability/Clinical Decision Making Evolving/Moderate complexity    Clinical Decision Making Moderate    Rehab Potential Good    PT Frequency 2x / week    PT Duration 8 weeks    PT Treatment/Interventions ADLs/Self Care Home Management;DME Instruction;Gait training;Stair training;Functional mobility training;Therapeutic activities;Therapeutic exercise;Balance training;Neuromuscular re-education;Patient/family education;Energy conservation    PT Next Visit Plan balance and strength, hip abductor strengthening to improve trendelenburg during gait, work on reciprocal arm swing.    PT Home Exercise Plan Access Code: [email or web address]    Consulted and Agree with Plan of Care Patient           Patient will benefit from skilled therapeutic intervention in order to improve the following deficits and impairments:  Abnormal gait,Pain,Improper body mechanics,Decreased mobility,Postural dysfunction,Decreased activity tolerance,Decreased endurance,Decreased strength,Decreased balance,Difficulty walking  Visit Diagnosis: Muscle weakness (generalized)  Other abnormalities of gait and mobility     Problem List Patient Active Problem List   Diagnosis Date Noted  . Current use of long term anticoagulation 05/07/2019   . Dizziness 05/07/2019  . Hypertension 04/26/2015  . Dry mouth 04/26/2015  . Morbid obesity ([place]) 09/04/2014  . Hypokalemia 08/12/2014  . Allergic rhinitis 08/04/2014  . Anxiety 08/04/2014  . Cannot sleep 08/04/2014  . Neuropathic pain 08/04/2014  . Atrial fibrillation ([place]) 08/04/2014  . Avitaminosis D 08/04/2014  . Gastroesophageal reflux disease without esophagitis 11/07/2013  . Polypharmacy 08/19/2013  . Osteoarthritis 06/11/2013  . Rheumatoid arthritis ([place]) 06/11/2013  . Hypercholesteremia 05/12/2009  . Benign essential tremor 05/12/2009  . Prediabetes 08/26/2008  . Adult hypothyroidism 08/26/2008  . Menopausal symptom 08/26/2008   2:31 PM, 04/05/20 Etta Grandchild, PT, DPT Physical Therapist - [facility] in [place] (Office)      Etta Grandchild 04/05/2020, 2:17 PM  [place] [facility] [address] [place], [place], [address] Phone: [phone]   Fax:  [phone]  Name: Glenda Williams MRN: [id number] Date of Birth: 03-21-1940

## 2020-04-07 ENCOUNTER — Other Ambulatory Visit: Payer: Self-pay

## 2020-04-07 ENCOUNTER — Ambulatory Visit: Payer: Medicare Other | Attending: Neurology

## 2020-04-07 DIAGNOSIS — M6281 Muscle weakness (generalized): Secondary | ICD-10-CM | POA: Insufficient documentation

## 2020-04-07 DIAGNOSIS — R2689 Other abnormalities of gait and mobility: Secondary | ICD-10-CM | POA: Insufficient documentation

## 2020-04-07 DIAGNOSIS — Z9181 History of falling: Secondary | ICD-10-CM | POA: Insufficient documentation

## 2020-04-07 NOTE — Therapy (Signed)
[facility] [address]. [place], [place], [address] Phone: [phone]   Fax:  [phone]  Physical Therapy Treatment/Recertification  Patient Details  Name: Glenda Williams MRN: [id number] Date of Birth: 03/08/1940 Referring Provider (PT): Vladimir Crofts, MD   Encounter Date: 04/07/2020   PT End of Session - 04/07/20 1701    Visit Number 15    Number of Visits 25    Date for PT Re-Evaluation 06/02/20    PT Start Time 5462    PT Stop Time 1430    PT Time Calculation (min) 45 min    Activity Tolerance Patient tolerated treatment well;No increased pain    Behavior During Therapy WFL for tasks assessed/performed           Past Medical History:  Diagnosis Date  . A-fib ([place])   . Arthritis   . Bell palsy 09/04/2014  . GERD (gastroesophageal reflux disease)   . Heart disease   . Rheumatoid arthritis ([place])   . Thyroid disease   . Tremors of nervous system     Past Surgical History:  Procedure Laterality Date  . BREAST BIOPSY Left 1988   Benign  . BREAST BIOPSY Left 1987   Benign  . BREAST EXCISIONAL BIOPSY    . BROW LIFT Bilateral 05/23/2016   Procedure: BLEPHAROPLASTY upper eyelid; w/excess skin;  Surgeon: Karle Starch, MD;  Location: [place];  Service: Ophthalmology;  Laterality: Bilateral;  . CARPAL TUNNEL RELEASE Bilateral   . CHOLECYSTECTOMY     Dr. Bary Castilla  . COLONOSCOPY  2006  . JOINT REPLACEMENT Bilateral    knees  . REPLACEMENT TOTAL KNEE Left 2005  . REPLACEMENT TOTAL KNEE Right 1998    There were no vitals filed for this visit.   Subjective Assessment - 04/07/20 1632    Subjective Pt doing well today. She denies any excessive soreness after her last therapy session. No specific questions or concerns upon arrival today.    Pertinent History Pt is a pleasant 80 y.o. female referred to PT for parkinsonisms and imbalance. Pt has a PMH of Afib, RA, valvular disease, HTN, hypercholesterolemia, and  obesity. Pt reports balance difficulties in the past 6-8 months with 2 falls falling in her office at her home tripping over a rug falling into the corner of her desk. Pt feels most off balance when she is tired or stressed she begins feeling unsteady. Pt denies dizziness or lightheadedness. Pt believes she has better balance without shoes on but has new SAS shoes that have been helpful for her. Pt displays difficulty walking on unstable surfaces like grass and pt has difficulty with stairs. Pt lives in a [facility] with flat entrance and accessible shower and elevated toilet with 42" doorways with wide hallways and lives with her husband. Pt's goal with PT is to improve her walking abilities and endurance.    How long can you stand comfortably? 1 hour    How long can you walk comfortably? 15-20 min depending on walking surface.    Patient Stated Goals Improve walking endurance.    Currently in Pain? No/denies             TREATMENT   Ther-ex Seatedmarcheswith green tband around knees 2 x 20; Seatedclams withgreen tband3s hold 2 x 20BLE; Seated adductor ball squeeze 3s hold2 x 20BLE; Seated LAQwith 4# ankle weights 2 x 15BLE; Sit to stand without UE support 2 x 10; Standing heel raises with BUE support x  20 BLE; Standing hip abduction with 4# ankle weights x 10 BLE; Standing marches with 4# ankle weights x 10 BLE; Nu-Step L58fr 435m LE's only during history for cool-down (seat position 11),  unbilled;   Neuromuscular Re-education All exercises performed without UE support next to t unless otherwise specified: Airex NBOS eyes open/closed x 30s each;  Airex NBOS horizontal and vertical head turns x 30s each; Airex alternating 6" step taps x 10 BLE; Airex staggered stance with front foot on 6" step and rear-foot on Airex pad x 30s BLE; Airex staggered stance with front foot on 6" step and rear-foot on Airex pad with horizontal head turns x 30s each;    Pt educated  throughout session about proper posture and technique with exercises. Improved exercise technique, movement at target joints, use of target muscles after min to mod verbal, visual, tactile cues.    Patient demonstrates excellent motivation during session today.Deferred updating outcome measures with patient on this date. Outcome measures were last updated on 02/11/20. At that time her 3064sit to Stand Test improved from 6 repetitions at initial evaluation to 11 repetitions. Her TUG also dropped from 14.7s to 11.9s. Her BERG only showed very minimal improvement in her balance improving from 45/56 at initial evaluation to 46/56. She is significant limited by hip abductor weakness and low back pain when attempting single stance or stairs. She is still unable to perform the DGI without anassistive devicehowever scored 16/24 with asingle point cane. Pt has not achieved maximal benefit from physical therapy and would benefit from continued skilled PT services to address deficits in balance, strength, and pain in order to improve function at home and decrease her risk for falls.                                       PT Long Term Goals - 04/07/20 1702      PT LONG TERM GOAL #1   Title Pt will improve FOTO score to 60 to display significant improvements in functional mobility.    Baseline 11/8: 48; 02/11/20: 40    Time 8    Period Weeks    Status On-going    Target Date 06/02/20      PT LONG TERM GOAL #2   Title Pt will improve 30 sec STS to 13 reps to match appropriate age ranges for clinically significant LE strength for comunity level ADL's    Baseline 11/8: 6 reps, no UE support; 02/11/20: 11 reps, no UE support    Time 8    Period Weeks    Status Partially Met    Target Date 06/02/20      PT LONG TERM GOAL #3   Title Pt will improve TUG with SPC to < 12 sec to display decreased risk of falls.    Baseline 11/8: 14. 66 sec; 02/11/20: 11.9s    Time 4     Period Weeks    Status Achieved      PT LONG TERM GOAL #4   Title Pt will improve Berg to > 52/56 to display significant decrease in falls risk with home and community tasks.    Baseline 11/8: 45/56; 02/11/20: 46/56    Time 8    Period Weeks    Status Partially Met    Target Date 06/02/20      PT LONG TERM GOAL #5   Title Pt will asc/desc  full flight of stairs with SPC and single handrail and reciprocal pattern with < 2/10 pain via NPS to improve community level tasks.    Baseline 11/8: Asc/desc 4 stairs with reciprocal pattern, SPC, and handrail with up to 5/10 NPS and reports of difficulty. Good safety. 02/11/20 reciprocal with BUE support, able to perform with single point cane as well with single rail, pain increases in back to 5/10    Time 8    Period Weeks    Status On-going    Target Date 06/02/20      PT LONG TERM GOAL #6   Title Pt will improve DGI with no AD to >15 to improve community ambulation tasks.    Baseline 11/10: 10/24; 02/11/20: 16/24 with single point cane, unable to perform without assistive device at this time;    Time 8    Period Weeks    Status On-going    Target Date 06/02/20                 Plan - 04/07/20 1702    Clinical Impression Statement Patient demonstrates excellent motivation during session today. Deferred updating outcome measures with patient on this date. Outcome measures were last updated on 02/11/20. At that time her 16s Sit to Stand Test improved from 6 repetitions at initial evaluation to 11 repetitions. Her TUG also dropped from 14.7s to 11.9s. Her BERG only showed very minimal improvement in her balance improving from 45/56 at initial evaluation to 46/56. She is significant limited by hip abductor weakness and low back pain when attempting single stance or stairs. She is still unable to perform the DGI without an assistive device however scored 16/24 with a single point cane. Pt has not achieved maximal benefit from physical therapy and  would benefit from continued skilled PT services to address deficits in balance, strength, and pain in order to improve function at home and decrease her risk for falls.    Personal Factors and Comorbidities Age;Comorbidity 3+;Past/Current Experience    Examination-Activity Limitations Stand;Locomotion Level;Squat;Stairs    Examination-Participation Restrictions Community Activity    Stability/Clinical Decision Making Evolving/Moderate complexity    Rehab Potential Good    PT Frequency 2x / week    PT Duration 8 weeks    PT Treatment/Interventions ADLs/Self Care Home Management;DME Instruction;Gait training;Stair training;Functional mobility training;Therapeutic activities;Therapeutic exercise;Balance training;Neuromuscular re-education;Patient/family education;Energy conservation    PT Next Visit Plan Update outcome measures/goals, balance and strength, hip abductor strengthening to improve trendelenburg during gait, work on reciprocal arm swing.    PT Home Exercise Plan Access Code: [email or web address]           Patient will benefit from skilled therapeutic intervention in order to improve the following deficits and impairments:  Abnormal gait,Pain,Improper body mechanics,Decreased mobility,Postural dysfunction,Decreased activity tolerance,Decreased endurance,Decreased strength,Decreased balance,Difficulty walking  Visit Diagnosis: Muscle weakness (generalized)  Other abnormalities of gait and mobility     Problem List Patient Active Problem List   Diagnosis Date Noted  . Current use of long term anticoagulation 05/07/2019  . Dizziness 05/07/2019  . Hypertension 04/26/2015  . Dry mouth 04/26/2015  . Morbid obesity ([place]) 09/04/2014  . Hypokalemia 08/12/2014  . Allergic rhinitis 08/04/2014  . Anxiety 08/04/2014  . Cannot sleep 08/04/2014  . Neuropathic pain 08/04/2014  . Atrial fibrillation ([place]) 08/04/2014  . Avitaminosis D 08/04/2014  . Gastroesophageal reflux disease without  esophagitis 11/07/2013  . Polypharmacy 08/19/2013  . Osteoarthritis 06/11/2013  . Rheumatoid arthritis ([place]) 06/11/2013  . Hypercholesteremia 05/12/2009  . Benign  essential tremor 05/12/2009  . Prediabetes 08/26/2008  . Adult hypothyroidism 08/26/2008  . Menopausal symptom 08/26/2008     Phillips Grout PT, DPT, GCS  Huprich,Jason 04/07/2020, 5:06 PM  [facility] [address] [place], [place], [address] Phone: [phone]   Fax:  [phone]  Name: AFIFA TRUAX MRN: [id number] Date of Birth: [birth year]

## 2020-04-12 ENCOUNTER — Ambulatory Visit: Payer: Medicare Other

## 2020-04-13 DIAGNOSIS — M0579 Rheumatoid arthritis with rheumatoid factor of multiple sites without organ or systems involvement: Secondary | ICD-10-CM | POA: Diagnosis not present

## 2020-04-14 ENCOUNTER — Ambulatory Visit: Payer: Medicare Other

## 2020-04-19 ENCOUNTER — Other Ambulatory Visit: Payer: Self-pay

## 2020-04-19 ENCOUNTER — Ambulatory Visit: Payer: Medicare Other

## 2020-04-19 DIAGNOSIS — R2689 Other abnormalities of gait and mobility: Secondary | ICD-10-CM

## 2020-04-19 DIAGNOSIS — M6281 Muscle weakness (generalized): Secondary | ICD-10-CM | POA: Diagnosis not present

## 2020-04-19 DIAGNOSIS — Z9181 History of falling: Secondary | ICD-10-CM | POA: Diagnosis not present

## 2020-04-19 NOTE — Therapy (Signed)
[facility] [address] [place], [place], [address] Phone: [phone]   Fax:  [phone]  Physical Therapy Treatment/Goal Update  Patient Details  Name: Glenda Williams MRN: [id number] Date of Birth: 03-30-1940 Referring Provider (PT): Vladimir Crofts, MD   Encounter Date: 04/19/2020   PT End of Session - 04/19/20 1450    Visit Number 16    Number of Visits 25    Date for PT Re-Evaluation 06/02/20    PT Start Time 3846    PT Stop Time 1515    PT Time Calculation (min) 30 min    Activity Tolerance Patient tolerated treatment well;No increased pain    Behavior During Therapy WFL for tasks assessed/performed           Past Medical History:  Diagnosis Date  . A-fib ([place])   . Arthritis   . Bell palsy 09/04/2014  . GERD (gastroesophageal reflux disease)   . Heart disease   . Rheumatoid arthritis ([place])   . Thyroid disease   . Tremors of nervous system     Past Surgical History:  Procedure Laterality Date  . BREAST BIOPSY Left 1988   Benign  . BREAST BIOPSY Left 1987   Benign  . BREAST EXCISIONAL BIOPSY    . BROW LIFT Bilateral 05/23/2016   Procedure: BLEPHAROPLASTY upper eyelid; w/excess skin;  Surgeon: Karle Starch, MD;  Location: [place];  Service: Ophthalmology;  Laterality: Bilateral;  . CARPAL TUNNEL RELEASE Bilateral   . CHOLECYSTECTOMY     Dr. Bary Castilla  . COLONOSCOPY  2006  . JOINT REPLACEMENT Bilateral    knees  . REPLACEMENT TOTAL KNEE Left 2005  . REPLACEMENT TOTAL KNEE Right 1998    There were no vitals filed for this visit.   Subjective Assessment - 04/19/20 1449    Subjective Pt doing well today however does report increase in bilateral hip pain upon arrival today. Rates her pain as 5/10 at start of session. Reports that she had an arthritis flare last week. No specific questions upon arrival today.    Pertinent History Pt is a pleasant 80 y.o. female referred to PT for parkinsonisms and  imbalance. Pt has a PMH of Afib, RA, valvular disease, HTN, hypercholesterolemia, and obesity. Pt reports balance difficulties in the past 6-8 months with 2 falls falling in her office at her home tripping over a rug falling into the corner of her desk. Pt feels most off balance when she is tired or stressed she begins feeling unsteady. Pt denies dizziness or lightheadedness. Pt believes she has better balance without shoes on but has new SAS shoes that have been helpful for her. Pt displays difficulty walking on unstable surfaces like grass and pt has difficulty with stairs. Pt lives in a [facility] with flat entrance and accessible shower and elevated toilet with 42" doorways with wide hallways and lives with her husband. Pt's goal with PT is to improve her walking abilities and endurance.    How long can you stand comfortably? 1 hour    How long can you walk comfortably? 15-20 min depending on walking surface.    Patient Stated Goals Improve walking endurance.    Currently in Pain? Yes    Pain Score 5     Pain Location Hip    Pain Orientation Right;Left    Pain Onset More than a month ago    Pain Frequency Intermittent  [facility] PT Assessment - 04/19/20 1512      Berg Balance Test   Sit to Stand Able to stand without using hands and stabilize independently    Standing Unsupported Able to stand safely 2 minutes    Sitting with Back Unsupported but Feet Supported on Floor or Stool Able to sit safely and securely 2 minutes    Stand to Sit Sits safely with minimal use of hands    Transfers Able to transfer safely, minor use of hands    Standing Unsupported with Eyes Closed Able to stand 10 seconds safely    Standing Unsupported with Feet Together Able to place feet together independently and stand 1 minute safely    From Standing, Reach Forward with Outstretched Arm Can reach forward >12 cm safely (5")    From Standing Position, Pick up Object from Floor Able to pick up shoe safely and  easily    From Standing Position, Turn to Look Behind Over each Shoulder Looks behind from both sides and weight shifts well    Turn 360 Degrees Able to turn 360 degrees safely but slowly    Standing Unsupported, Alternately Place Feet on Step/Stool Able to complete >2 steps/needs minimal assist    Standing Unsupported, One Foot in Front Able to plae foot ahead of the other independently and hold 30 seconds    Standing on One Leg Tries to lift leg/unable to hold 3 seconds but remains standing independently    Total Score 46             TREATMENT   Ther-ex Nu-Step L2 for 5 min, LE's only during history for warm-up(seat position 8),42mnutes unbilled; Updated outcome measures and goals with patient: FOTO: 54; 30s Sit to Stand: 9.5 repetitions; TUG: 11.8s; BERG: 46/56; Stairs: reciprocal pattern with BUE support, able to perform with single point cane and single handrail;    Patient demonstrates excellent motivation during session today. She arrived late for her appointment so session was limited accordingly. Updated goals and outcome measures with patient during session. Her 30s Sit to Stand test decreased to 9.5 repetitions which is improved from the initial evaluation however worse compared to the last time it was updated. Her TUG also dropped from 14.7s at initial evaluation to 11.8s today. Her BERG remains unchanged at 46/56 today. She is significant limited by hip abductor weakness and low back pain when attempting single leg stance or stairs. Deferred DGI due to time constraints. She has had interruptions in her therapy recently due to other health issues and scheduling concerns. Pt has not achieved maximal benefit from physical therapy and would benefit from continued skilled PT services to address deficits in balance, strength, and pain in order to improve function at home and decrease her risk for falls.                        PT Long Term Goals -  04/19/20 1454      PT LONG TERM GOAL #1   Title Pt will improve FOTO score to 60 to display significant improvements in functional mobility.    Baseline 11/8: 48; 02/11/20: 40; 04/19/20: 54    Time 8    Period Weeks    Status Partially Met    Target Date 06/02/20      PT LONG TERM GOAL #2   Title Pt will improve 30 sec STS to 13 reps to match appropriate age ranges for clinically significant LE strength for comunity  level ADL's    Baseline 11/8: 6 reps, no UE support; 02/11/20: 11 reps, no UE support; 04/19/20: 9.5 reps, no UE support    Time 8    Period Weeks    Status Partially Met    Target Date 06/02/20      PT LONG TERM GOAL #3   Title Pt will improve TUG with SPC to < 12 sec to display decreased risk of falls.    Baseline 11/8: 14. 66 sec; 02/11/20: 11.9s; 04/19/20: 11.8s;    Time 4    Period Weeks    Status Achieved      PT LONG TERM GOAL #4   Title Pt will improve Berg to > 52/56 to display significant decrease in falls risk with home and community tasks.    Baseline 11/8: 45/56; 02/11/20: 46/56; 04/19/20: 46/56    Time 8    Period Weeks    Status Partially Met    Target Date 06/02/20      PT LONG TERM GOAL #5   Title Pt will asc/desc full flight of stairs with SPC and single handrail and reciprocal pattern with < 2/10 pain via NPS to improve community level tasks.    Baseline 11/8: Asc/desc 4 stairs with reciprocal pattern, SPC, and handrail with up to 5/10 NPS and reports of difficulty. Good safety. 02/11/20 reciprocal with BUE support, able to perform with single point cane as well with single rail, pain increases in back to 5/10; 04/19/20: unchanged    Time 8    Period Weeks    Status On-going    Target Date 06/02/20      PT LONG TERM GOAL #6   Title Pt will improve DGI with no AD to >15 to improve community ambulation tasks.    Baseline 11/10: 10/24; 02/11/20: 16/24 with single point cane, unable to perform without assistive device at this time;    Time 8    Period  Weeks    Status On-going    Target Date 06/02/20                 Plan - 04/19/20 1450    Clinical Impression Statement Patient demonstrates excellent motivation during session today. She arrived late for her appointment so session was limited accordingly. Updated goals and outcome measures with patient during session. Her 30s Sit to Stand test decreased to 9.5 repetitions which is improved from the initial evaluation however worse compared to the last time it was updated. Her TUG also dropped from 14.7s at initial evaluation to 11.8s today. Her BERG remains unchanged at 46/56 today. She is significant limited by hip abductor weakness and low back pain when attempting single leg stance or stairs. Deferred DGI due to time constraints. She has had interruptions in her therapy recently due to other health issues and scheduling concerns. Pt has not achieved maximal benefit from physical therapy and would benefit from continued skilled PT services to address deficits in balance, strength, and pain in order to improve function at home and decrease her risk for falls.    Personal Factors and Comorbidities Age;Comorbidity 3+;Past/Current Experience    Examination-Activity Limitations Stand;Locomotion Level;Squat;Stairs    Examination-Participation Restrictions Community Activity    Stability/Clinical Decision Making Evolving/Moderate complexity    Rehab Potential Good    PT Frequency 2x / week    PT Duration 8 weeks    PT Treatment/Interventions ADLs/Self Care Home Management;DME Instruction;Gait training;Stair training;Functional mobility training;Therapeutic activities;Therapeutic exercise;Balance training;Neuromuscular re-education;Patient/family education;Energy conservation  PT Next Visit Plan balance and strength, hip abductor strengthening to improve trendelenburg during gait, work on reciprocal arm swing.    PT Home Exercise Plan Access Code: [email or web address]           Patient will benefit  from skilled therapeutic intervention in order to improve the following deficits and impairments:  Abnormal gait,Pain,Improper body mechanics,Decreased mobility,Postural dysfunction,Decreased activity tolerance,Decreased endurance,Decreased strength,Decreased balance,Difficulty walking  Visit Diagnosis: Muscle weakness (generalized)  Other abnormalities of gait and mobility     Problem List Patient Active Problem List   Diagnosis Date Noted  . Current use of long term anticoagulation 05/07/2019  . Dizziness 05/07/2019  . Hypertension 04/26/2015  . Dry mouth 04/26/2015  . Morbid obesity ([place]) 09/04/2014  . Hypokalemia 08/12/2014  . Allergic rhinitis 08/04/2014  . Anxiety 08/04/2014  . Cannot sleep 08/04/2014  . Neuropathic pain 08/04/2014  . Atrial fibrillation ([place]) 08/04/2014  . Avitaminosis D 08/04/2014  . Gastroesophageal reflux disease without esophagitis 11/07/2013  . Polypharmacy 08/19/2013  . Osteoarthritis 06/11/2013  . Rheumatoid arthritis ([place]) 06/11/2013  . Hypercholesteremia 05/12/2009  . Benign essential tremor 05/12/2009  . Prediabetes 08/26/2008  . Adult hypothyroidism 08/26/2008  . Menopausal symptom 08/26/2008   Phillips Grout PT, DPT, GCS  Kaedon Fanelli 04/19/2020, 4:26 PM  [facility] [address] [place], [place], [address] Phone: [phone]   Fax:  [phone]  Name: HARMAN FERRIN MRN: [id number] Date of Birth: 04-Jun-1940

## 2020-04-21 ENCOUNTER — Other Ambulatory Visit: Payer: Self-pay

## 2020-04-21 ENCOUNTER — Ambulatory Visit: Payer: Medicare Other

## 2020-04-21 DIAGNOSIS — R2689 Other abnormalities of gait and mobility: Secondary | ICD-10-CM | POA: Diagnosis not present

## 2020-04-21 DIAGNOSIS — Z9181 History of falling: Secondary | ICD-10-CM | POA: Diagnosis not present

## 2020-04-21 DIAGNOSIS — M6281 Muscle weakness (generalized): Secondary | ICD-10-CM | POA: Diagnosis not present

## 2020-04-21 NOTE — Therapy (Signed)
[facility] [address] [place], [place], [address] Phone: [phone]   Fax:  [phone]  Physical Therapy Treatment  Patient Details  Name: Glenda Williams MRN: [id number] Date of Birth: 07-18-1940 Referring Provider (PT): Vladimir Crofts, MD   Encounter Date: 04/21/2020   PT End of Session - 04/21/20 1358    Visit Number 17    Number of Visits 25    Date for PT Re-Evaluation 06/02/20    PT Start Time 6962    PT Stop Time 1430    PT Time Calculation (min) 35 min    Activity Tolerance Patient tolerated treatment well;No increased pain    Behavior During Therapy WFL for tasks assessed/performed           Past Medical History:  Diagnosis Date  . A-fib ([place])   . Arthritis   . Bell palsy 09/04/2014  . GERD (gastroesophageal reflux disease)   . Heart disease   . Rheumatoid arthritis ([place])   . Thyroid disease   . Tremors of nervous system     Past Surgical History:  Procedure Laterality Date  . BREAST BIOPSY Left 1988   Benign  . BREAST BIOPSY Left 1987   Benign  . BREAST EXCISIONAL BIOPSY    . BROW LIFT Bilateral 05/23/2016   Procedure: BLEPHAROPLASTY upper eyelid; w/excess skin;  Surgeon: Karle Starch, MD;  Location: [facility];  Service: Ophthalmology;  Laterality: Bilateral;  . CARPAL TUNNEL RELEASE Bilateral   . CHOLECYSTECTOMY     Dr. Bary Castilla  . COLONOSCOPY  2006  . JOINT REPLACEMENT Bilateral    knees  . REPLACEMENT TOTAL KNEE Left 2005  . REPLACEMENT TOTAL KNEE Right 1998    There were no vitals filed for this visit.   Subjective Assessment - 04/21/20 1356    Subjective Pt doing well today however does report BLE soreness upon arrival today. Does not rate on NPS. No falls or stumbles since last therapy session. No specific questions upon arrival today.    Pertinent History Pt is a pleasant 80 y.o. female referred to PT for parkinsonisms and imbalance. Pt has a PMH of Afib, RA, valvular disease, HTN,  hypercholesterolemia, and obesity. Pt reports balance difficulties in the past 6-8 months with 2 falls falling in her office at her home tripping over a rug falling into the corner of her desk. Pt feels most off balance when she is tired or stressed she begins feeling unsteady. Pt denies dizziness or lightheadedness. Pt believes she has better balance without shoes on but has new SAS shoes that have been helpful for her. Pt displays difficulty walking on unstable surfaces like grass and pt has difficulty with stairs. Pt lives in a [facility] with flat entrance and accessible shower and elevated toilet with 42" doorways with wide hallways and lives with her husband. Pt's goal with PT is to improve her walking abilities and endurance.    How long can you stand comfortably? 1 hour    How long can you walk comfortably? 15-20 min depending on walking surface.    Patient Stated Goals Improve walking endurance.    Currently in Pain? Yes   Bilateral LE soreness, does not rate on NPS.   Pain Onset --                TREATMENT   Ther-ex Nu-Step L28fr 515m LE's only during history (seat position 11), 3 minutes unbilled; Seatedmarcheswith 4# ankle weights (AW) 2 x  20; Seatedclams withgreen tband3s hold 2 x 20 BLE; Seated adductor ball squeeze 3s hold2 x 20BLE; Seated LAQwith 4# ankle weights 2 x 20BLE; Standing heel/toe raises with minimal UE support x 10 each direction; Standing hip abduction with 4# ankle weights x 10 BLE; Standing marches with 4# ankle weights x 10 BLE; Standing HS curls with 4# ankle weights x 10 BLE; Sit to stand without UE support 2 x 10;   Pt educated throughout session about proper posture and technique with exercises. Improved exercise technique, movement at target joints, use of target muscles after min to mod verbal, visual, tactile cues.    Pt arrived late so session was abbreviated today. Continued with LE strengthening with intermittent seated rest  breaks. Shedemonstrates excellent motivation during session. Struggles with intermittent fatigue during session and monitored closely throughout session.Will continue to progress strengthening and balance exercises at follow-up sessions. She continues to struggle with single leg balance and requires BUE support for stability with all marching activities.Encouraged pt to continue HEP. She has not achieved maximal benefit from physical therapy and would benefit from continued skilled PT services to address deficits in balance, strength, and pain in order to improve function at home and decrease her risk for falls.                               PT Long Term Goals - 04/19/20 1454      PT LONG TERM GOAL #1   Title Pt will improve FOTO score to 60 to display significant improvements in functional mobility.    Baseline 11/8: 48; 02/11/20: 40; 04/19/20: 54    Time 8    Period Weeks    Status Partially Met    Target Date 06/02/20      PT LONG TERM GOAL #2   Title Pt will improve 30 sec STS to 13 reps to match appropriate age ranges for clinically significant LE strength for comunity level ADL's    Baseline 11/8: 6 reps, no UE support; 02/11/20: 11 reps, no UE support; 04/19/20: 9.5 reps, no UE support    Time 8    Period Weeks    Status Partially Met    Target Date 06/02/20      PT LONG TERM GOAL #3   Title Pt will improve TUG with SPC to < 12 sec to display decreased risk of falls.    Baseline 11/8: 14. 66 sec; 02/11/20: 11.9s; 04/19/20: 11.8s;    Time 4    Period Weeks    Status Achieved      PT LONG TERM GOAL #4   Title Pt will improve Berg to > 52/56 to display significant decrease in falls risk with home and community tasks.    Baseline 11/8: 45/56; 02/11/20: 46/56; 04/19/20: 46/56    Time 8    Period Weeks    Status Partially Met    Target Date 06/02/20      PT LONG TERM GOAL #5   Title Pt will asc/desc full flight of stairs with SPC and single handrail and  reciprocal pattern with < 2/10 pain via NPS to improve community level tasks.    Baseline 11/8: Asc/desc 4 stairs with reciprocal pattern, SPC, and handrail with up to 5/10 NPS and reports of difficulty. Good safety. 02/11/20 reciprocal with BUE support, able to perform with single point cane as well with single rail, pain increases in back to 5/10; 04/19/20: unchanged  Time 8    Period Weeks    Status On-going    Target Date 06/02/20      PT LONG TERM GOAL #6   Title Pt will improve DGI with no AD to >15 to improve community ambulation tasks.    Baseline 11/10: 10/24; 02/11/20: 16/24 with single point cane, unable to perform without assistive device at this time;    Time 8    Period Weeks    Status On-going    Target Date 06/02/20                 Plan - 04/21/20 1358    Clinical Impression Statement Pt arrived late so session was abbreviated today. Continued with LE strengthening with intermittent seated rest breaks. She demonstrates excellent motivation during session. Struggles with intermittent fatigue during session and monitored closely throughout session. Will continue to progress strengthening and balance exercises at follow-up sessions. She continues to struggle with single leg balance and requires BUE support for stability with all marching activities. Encouraged pt to continue HEP. She has not achieved maximal benefit from physical therapy and would benefit from continued skilled PT services to address deficits in balance, strength, and pain in order to improve function at home and decrease her risk for falls.    Personal Factors and Comorbidities Age;Comorbidity 3+;Past/Current Experience    Examination-Activity Limitations Stand;Locomotion Level;Squat;Stairs    Examination-Participation Restrictions Community Activity    Stability/Clinical Decision Making Evolving/Moderate complexity    Rehab Potential Good    PT Frequency 2x / week    PT Duration 8 weeks    PT  Treatment/Interventions ADLs/Self Care Home Management;DME Instruction;Gait training;Stair training;Functional mobility training;Therapeutic activities;Therapeutic exercise;Balance training;Neuromuscular re-education;Patient/family education;Energy conservation    PT Next Visit Plan balance and strength, hip abductor strengthening to improve trendelenburg during gait, work on reciprocal arm swing.    PT Home Exercise Plan Access Code: [email or web address]           Patient will benefit from skilled therapeutic intervention in order to improve the following deficits and impairments:  Abnormal gait,Pain,Improper body mechanics,Decreased mobility,Postural dysfunction,Decreased activity tolerance,Decreased endurance,Decreased strength,Decreased balance,Difficulty walking  Visit Diagnosis: Muscle weakness (generalized)  Other abnormalities of gait and mobility     Problem List Patient Active Problem List   Diagnosis Date Noted  . Current use of long term anticoagulation 05/07/2019  . Dizziness 05/07/2019  . Hypertension 04/26/2015  . Dry mouth 04/26/2015  . Morbid obesity ([place]) 09/04/2014  . Hypokalemia 08/12/2014  . Allergic rhinitis 08/04/2014  . Anxiety 08/04/2014  . Cannot sleep 08/04/2014  . Neuropathic pain 08/04/2014  . Atrial fibrillation ([place]) 08/04/2014  . Avitaminosis D 08/04/2014  . Gastroesophageal reflux disease without esophagitis 11/07/2013  . Polypharmacy 08/19/2013  . Osteoarthritis 06/11/2013  . Rheumatoid arthritis ([place]) 06/11/2013  . Hypercholesteremia 05/12/2009  . Benign essential tremor 05/12/2009  . Prediabetes 08/26/2008  . Adult hypothyroidism 08/26/2008  . Menopausal symptom 08/26/2008   Phillips Grout PT, DPT, GCS  Banesa Tristan 04/21/2020, 2:56 PM  [facility] [address] [place], [place], [address] Phone: [phone]   Fax:  [phone]  Name: BLONDINE HOTTEL MRN: [id number] Date of Birth:  04-11-1940

## 2020-04-26 ENCOUNTER — Other Ambulatory Visit: Payer: Self-pay

## 2020-04-26 ENCOUNTER — Ambulatory Visit: Payer: Medicare Other

## 2020-04-26 DIAGNOSIS — R2689 Other abnormalities of gait and mobility: Secondary | ICD-10-CM | POA: Diagnosis not present

## 2020-04-26 DIAGNOSIS — Z9181 History of falling: Secondary | ICD-10-CM | POA: Diagnosis not present

## 2020-04-26 DIAGNOSIS — M6281 Muscle weakness (generalized): Secondary | ICD-10-CM | POA: Diagnosis not present

## 2020-04-26 NOTE — Therapy (Signed)
[facility] [facility] [address] [place], [place], [address] Phone: [phone]   Fax:  [phone]  Physical Therapy Treatment  Patient Details  Name: Glenda Williams MRN: [id number] Date of Birth: Nov 27, 1940 Referring Provider (PT): Vladimir Crofts, MD   Encounter Date: 04/26/2020   PT End of Session - 04/26/20 1446    Visit Number 18    Number of Visits 25    Date for PT Re-Evaluation 06/02/20    PT Start Time 1440    PT Stop Time 2229    PT Time Calculation (min) 35 min    Activity Tolerance Patient tolerated treatment well;No increased pain    Behavior During Therapy WFL for tasks assessed/performed           Past Medical History:  Diagnosis Date  . A-fib ([place])   . Arthritis   . Bell palsy 09/04/2014  . GERD (gastroesophageal reflux disease)   . Heart disease   . Rheumatoid arthritis ([place])   . Thyroid disease   . Tremors of nervous system     Past Surgical History:  Procedure Laterality Date  . BREAST BIOPSY Left 1988   Benign  . BREAST BIOPSY Left 1987   Benign  . BREAST EXCISIONAL BIOPSY    . BROW LIFT Bilateral 05/23/2016   Procedure: BLEPHAROPLASTY upper eyelid; w/excess skin;  Surgeon: Karle Starch, MD;  Location: [facility];  Service: Ophthalmology;  Laterality: Bilateral;  . CARPAL TUNNEL RELEASE Bilateral   . CHOLECYSTECTOMY     Dr. Bary Castilla  . COLONOSCOPY  2006  . JOINT REPLACEMENT Bilateral    knees  . REPLACEMENT TOTAL KNEE Left 2005  . REPLACEMENT TOTAL KNEE Right 1998    There were no vitals filed for this visit.   Subjective Assessment - 04/26/20 1444    Subjective Pt doing well today. Denies minimal LE soreness upon arrival. Complains of R wrist pain which she rates as 4/10 upon arrival. No falls or stumbles since last therapy session. No specific questions upon arrival today.    Pertinent History Pt is a pleasant 80 y.o. female referred to PT for parkinsonisms and imbalance. Pt has a PMH of  Afib, RA, valvular disease, HTN, hypercholesterolemia, and obesity. Pt reports balance difficulties in the past 6-8 months with 2 falls falling in her office at her home tripping over a rug falling into the corner of her desk. Pt feels most off balance when she is tired or stressed she begins feeling unsteady. Pt denies dizziness or lightheadedness. Pt believes she has better balance without shoes on but has new SAS shoes that have been helpful for her. Pt displays difficulty walking on unstable surfaces like grass and pt has difficulty with stairs. Pt lives in a [facility] with flat entrance and accessible shower and elevated toilet with 42" doorways with wide hallways and lives with her husband. Pt's goal with PT is to improve her walking abilities and endurance.    How long can you stand comfortably? 1 hour    How long can you walk comfortably? 15-20 min depending on walking surface.    Patient Stated Goals Improve walking endurance.    Currently in Pain? Yes    Pain Score 4     Pain Location Wrist    Pain Orientation Right    Pain Descriptors / Indicators Throbbing;Aching    Pain Type Chronic pain    Pain Onset In the past 7 days    Pain Frequency  Intermittent             TREATMENT   Ther-ex Nu-Step L45fr 514m LE's only during history (seat position11),3 minutesunbilled; Seatedclams withgreen tband3s holdx 30 BLE; Seated adductor ball squeeze 3s hold x 30BLE; Standingheel raises with minimal UE supportx 20; Standing hip abduction with 4# ankle weights x 15 BLE; Standing marches with 4# ankle weights x 15 BLE; Standing HS curls with 4# ankle weights x 15 BLE; Standing hip extension with 4# ankle weights x 15 BLE; Alternating 6" step ups with BUE support x 15 on each side; Standing hip flexion marching with mirror feedback and verbal cues to keep a level pelvis and avoid drop;    Neuromuscular Re-education  All balance exercises performed without UE support unless  otherwise specified; Alternating 6" step taps with BUE support x 10 each; Airex WBOS eyes open/closed x 30s each; Airex NBOS eyes open/closed x 30s each; Airex NBOS balloon passes with student PT x 2 minutes;   Pt educated throughout session about proper posture and technique with exercises. Improved exercise technique, movement at target joints, use of target muscles after min to mod verbal, visual, tactile cues.    Pt arrived late so session was abbreviated today. Continued with LE strengthening with intermittent seated rest breaks.Struggles with intermittent fatigue during session and monitored closely throughout session.Included standing hip flexion marching with visual feedback in mirror to avoid hip drop. Reintroduced balance exercises today with most exercises performed on unstable Airex pad. She is demonstrating improved balance today especially with dynamic challenge of balloon passes. At next session with continue to challenge pt with dynamic balance exercises as well as progress hip and knee strengthening. Encouraged pt to continue HEP. She has not achieved maximal benefit from physical therapy and would benefit from continued skilled PT services to address deficits in balance, strength, and pain in order to improve function at home and decrease her risk for falls.                              PT Long Term Goals - 04/19/20 1454      PT LONG TERM GOAL #1   Title Pt will improve FOTO score to 60 to display significant improvements in functional mobility.    Baseline 11/8: 48; 02/11/20: 40; 04/19/20: 54    Time 8    Period Weeks    Status Partially Met    Target Date 06/02/20      PT LONG TERM GOAL #2   Title Pt will improve 30 sec STS to 13 reps to match appropriate age ranges for clinically significant LE strength for comunity level ADL's    Baseline 11/8: 6 reps, no UE support; 02/11/20: 11 reps, no UE support; 04/19/20: 9.5 reps, no UE support     Time 8    Period Weeks    Status Partially Met    Target Date 06/02/20      PT LONG TERM GOAL #3   Title Pt will improve TUG with SPC to < 12 sec to display decreased risk of falls.    Baseline 11/8: 14. 66 sec; 02/11/20: 11.9s; 04/19/20: 11.8s;    Time 4    Period Weeks    Status Achieved      PT LONG TERM GOAL #4   Title Pt will improve Berg to > 52/56 to display significant decrease in falls risk with home and community tasks.    Baseline 11/8:  45/56; 02/11/20: 46/56; 04/19/20: 46/56    Time 8    Period Weeks    Status Partially Met    Target Date 06/02/20      PT LONG TERM GOAL #5   Title Pt will asc/desc full flight of stairs with SPC and single handrail and reciprocal pattern with < 2/10 pain via NPS to improve community level tasks.    Baseline 11/8: Asc/desc 4 stairs with reciprocal pattern, SPC, and handrail with up to 5/10 NPS and reports of difficulty. Good safety. 02/11/20 reciprocal with BUE support, able to perform with single point cane as well with single rail, pain increases in back to 5/10; 04/19/20: unchanged    Time 8    Period Weeks    Status On-going    Target Date 06/02/20      PT LONG TERM GOAL #6   Title Pt will improve DGI with no AD to >15 to improve community ambulation tasks.    Baseline 11/10: 10/24; 02/11/20: 16/24 with single point cane, unable to perform without assistive device at this time;    Time 8    Period Weeks    Status On-going    Target Date 06/02/20                 Plan - 04/26/20 1446    Clinical Impression Statement Pt arrived late so session was abbreviated today. Continued with LE strengthening with intermittent seated rest breaks. Struggles with intermittent fatigue during session and monitored closely throughout session. Included standing hip flexion marching with visual feedback in mirror to avoid hip drop. Reintroduced balance exercises today with most exercises performed on unstable Airex pad. She is demonstrating improved  balance today especially with dynamic challenge of balloon passes. At next session with continue to challenge pt with dynamic balance exercises as well as progress hip and knee strengthening. Encouraged pt to continue HEP. She has not achieved maximal benefit from physical therapy and would benefit from continued skilled PT services to address deficits in balance, strength, and pain in order to improve function at home and decrease her risk for falls.    Personal Factors and Comorbidities Age;Comorbidity 3+;Past/Current Experience    Examination-Activity Limitations Stand;Locomotion Level;Squat;Stairs    Examination-Participation Restrictions Community Activity    Stability/Clinical Decision Making Evolving/Moderate complexity    Rehab Potential Good    PT Frequency 2x / week    PT Duration 8 weeks    PT Treatment/Interventions ADLs/Self Care Home Management;DME Instruction;Gait training;Stair training;Functional mobility training;Therapeutic activities;Therapeutic exercise;Balance training;Neuromuscular re-education;Patient/family education;Energy conservation    PT Next Visit Plan balance and strength, hip abductor strengthening to improve trendelenburg during gait, work on reciprocal arm swing.    PT Home Exercise Plan Access Code: [email or web address]           Patient will benefit from skilled therapeutic intervention in order to improve the following deficits and impairments:  Abnormal gait,Pain,Improper body mechanics,Decreased mobility,Postural dysfunction,Decreased activity tolerance,Decreased endurance,Decreased strength,Decreased balance,Difficulty walking  Visit Diagnosis: Muscle weakness (generalized)  Other abnormalities of gait and mobility     Problem List Patient Active Problem List   Diagnosis Date Noted  . Current use of long term anticoagulation 05/07/2019  . Dizziness 05/07/2019  . Hypertension 04/26/2015  . Dry mouth 04/26/2015  . Morbid obesity ([place]) 09/04/2014  .  Hypokalemia 08/12/2014  . Allergic rhinitis 08/04/2014  . Anxiety 08/04/2014  . Cannot sleep 08/04/2014  . Neuropathic pain 08/04/2014  . Atrial fibrillation ([place]) 08/04/2014  . Avitaminosis D 08/04/2014  .  Gastroesophageal reflux disease without esophagitis 11/07/2013  . Polypharmacy 08/19/2013  . Osteoarthritis 06/11/2013  . Rheumatoid arthritis ([place]) 06/11/2013  . Hypercholesteremia 05/12/2009  . Benign essential tremor 05/12/2009  . Prediabetes 08/26/2008  . Adult hypothyroidism 08/26/2008  . Menopausal symptom 08/26/2008   Phillips Grout PT, DPT, GCS  Jahzion Brogden 04/26/2020, 4:22 PM  [facility] [address] [place], [place], [address] Phone: [phone]   Fax:  [phone]  Name: DIANNAH RINDFLEISCH MRN: [id number] Date of Birth: 1940-12-09

## 2020-05-03 ENCOUNTER — Ambulatory Visit: Payer: Medicare Other

## 2020-05-03 ENCOUNTER — Other Ambulatory Visit: Payer: Self-pay

## 2020-05-03 DIAGNOSIS — Z9181 History of falling: Secondary | ICD-10-CM

## 2020-05-03 DIAGNOSIS — M6281 Muscle weakness (generalized): Secondary | ICD-10-CM | POA: Diagnosis not present

## 2020-05-03 DIAGNOSIS — R2689 Other abnormalities of gait and mobility: Secondary | ICD-10-CM

## 2020-05-03 NOTE — Therapy (Signed)
[facility] [address] [place], [place], [address] Phone: [phone]   Fax:  [phone]  Physical Therapy Treatment  Patient Details  Name: Glenda Williams MRN: [id number] Date of Birth: 1941-01-08 Referring Provider (PT): Vladimir Crofts, MD   Encounter Date: 05/03/2020   PT End of Session - 05/03/20 1544    Visit Number 19    Number of Visits 25    Date for PT Re-Evaluation 06/02/20    Authorization - Visit Number --    PT Start Time 1440    PT Stop Time 1525    PT Time Calculation (min) 45 min    Activity Tolerance Patient tolerated treatment well;No increased pain    Behavior During Therapy WFL for tasks assessed/performed           Past Medical History:  Diagnosis Date  . A-fib ([place])   . Arthritis   . Bell palsy 09/04/2014  . GERD (gastroesophageal reflux disease)   . Heart disease   . Rheumatoid arthritis ([place])   . Thyroid disease   . Tremors of nervous system     Past Surgical History:  Procedure Laterality Date  . BREAST BIOPSY Left 1988   Benign  . BREAST BIOPSY Left 1987   Benign  . BREAST EXCISIONAL BIOPSY    . BROW LIFT Bilateral 05/23/2016   Procedure: BLEPHAROPLASTY upper eyelid; w/excess skin;  Surgeon: Karle Starch, MD;  Location: [facility];  Service: Ophthalmology;  Laterality: Bilateral;  . CARPAL TUNNEL RELEASE Bilateral   . CHOLECYSTECTOMY     Dr. Bary Castilla  . COLONOSCOPY  2006  . JOINT REPLACEMENT Bilateral    knees  . REPLACEMENT TOTAL KNEE Left 2005  . REPLACEMENT TOTAL KNEE Right 1998    There were no vitals filed for this visit.   Subjective Assessment - 05/03/20 1442    Subjective Pt doing well today. Denies pain in LE but reports pain in her bilateral shoulders.    Pertinent History Pt is a pleasant 80 y.o. female referred to PT for parkinsonisms and imbalance. Pt has a PMH of Afib, RA, valvular disease, HTN, hypercholesterolemia, and obesity. Pt reports balance  difficulties in the past 6-8 months with 2 falls falling in her office at her home tripping over a rug falling into the corner of her desk. Pt feels most off balance when she is tired or stressed she begins feeling unsteady. Pt denies dizziness or lightheadedness. Pt believes she has better balance without shoes on but has new SAS shoes that have been helpful for her. Pt displays difficulty walking on unstable surfaces like grass and pt has difficulty with stairs. Pt lives in a [facility] with flat entrance and accessible shower and elevated toilet with 42" doorways with wide hallways and lives with her husband. Pt's goal with PT is to improve her walking abilities and endurance.    How long can you stand comfortably? 1 hour    How long can you walk comfortably? 15-20 min depending on walking surface.    Patient Stated Goals Improve walking endurance.    Currently in Pain? Yes    Pain Location Shoulder    Pain Orientation Right;Left    Pain Descriptors / Indicators Aching;Sore    Pain Type Chronic pain    Pain Onset In the past 7 days    Pain Frequency Intermittent             TREATMENT   Ther-ex Nu-Step L40fr 550m  LE's only during history (seat position11),3 minutesunbilled; Seatedclams withgreen tband3s holdx 30 BLE; Seated adductor ball squeeze 3s hold x 30BLE; Seated LAQ x15 BLE  Standingheel raises with minimal UE supportx 20; Standing hip abduction with 4# ankle weights x 15 BLE; Standing marches with 4# ankle weights x 40 alternating BLE; Standing HS curls with 4# ankle weights x 15 BLE; Standing hip extension with 4# ankle weights x 12 BLE; Alternating 6" step ups with BUE support x 15 on each side;     Neuromuscular Re-education  All balance exercises performed without UE support unless otherwise specified; Airex WBOS eyes open/closed x 30s each; Airex NBOS eyes open/closed x 30s each;  Standing on half foam roller, eyes open, 2x30s  Airex NBOS balloon passes  with student PT x 3 minutes;   Pt educated throughout session about proper posture and technique with exercises. Improved exercise technique, movement at target joints, use of target muscles after min to mod verbal, visual, tactile cues.    Pt arrived late but session was extended due to block in schedule. Continued with LE strengthening with intermittent seated rest breaks.Pt required frequent seated rest breaks due to easy fatigue and feeling winded.  She was monitored closely.  Pt continued to work on balance exercises today, which she was more successful with this session.  She continues to struggle with NBOS, eyes closed but is progressing well.  At next session continue to challenge pt with dynamic balance exercises as well as progress hip and knee strengthening. Encouraged pt to continue HEP. She has not achieved maximal benefit from physical therapy and would benefit from continued skilled PT services to address deficits in balance, strength, and pain in order to improve function at home and decrease her risk for falls.                               PT Long Term Goals - 04/19/20 1454      PT LONG TERM GOAL #1   Title Pt will improve FOTO score to 60 to display significant improvements in functional mobility.    Baseline 11/8: 48; 02/11/20: 40; 04/19/20: 54    Time 8    Period Weeks    Status Partially Met    Target Date 06/02/20      PT LONG TERM GOAL #2   Title Pt will improve 30 sec STS to 13 reps to match appropriate age ranges for clinically significant LE strength for comunity level ADL's    Baseline 11/8: 6 reps, no UE support; 02/11/20: 11 reps, no UE support; 04/19/20: 9.5 reps, no UE support    Time 8    Period Weeks    Status Partially Met    Target Date 06/02/20      PT LONG TERM GOAL #3   Title Pt will improve TUG with SPC to < 12 sec to display decreased risk of falls.    Baseline 11/8: 14. 66 sec; 02/11/20: 11.9s; 04/19/20: 11.8s;     Time 4    Period Weeks    Status Achieved      PT LONG TERM GOAL #4   Title Pt will improve Berg to > 52/56 to display significant decrease in falls risk with home and community tasks.    Baseline 11/8: 45/56; 02/11/20: 46/56; 04/19/20: 46/56    Time 8    Period Weeks    Status Partially Met    Target Date 06/02/20  PT LONG TERM GOAL #5   Title Pt will asc/desc full flight of stairs with SPC and single handrail and reciprocal pattern with < 2/10 pain via NPS to improve community level tasks.    Baseline 11/8: Asc/desc 4 stairs with reciprocal pattern, SPC, and handrail with up to 5/10 NPS and reports of difficulty. Good safety. 02/11/20 reciprocal with BUE support, able to perform with single point cane as well with single rail, pain increases in back to 5/10; 04/19/20: unchanged    Time 8    Period Weeks    Status On-going    Target Date 06/02/20      PT LONG TERM GOAL #6   Title Pt will improve DGI with no AD to >15 to improve community ambulation tasks.    Baseline 11/10: 10/24; 02/11/20: 16/24 with single point cane, unable to perform without assistive device at this time;    Time 8    Period Weeks    Status On-going    Target Date 06/02/20                 Plan - 05/03/20 1548    Clinical Impression Statement Pt arrived late but session was extended due to block in schedule. Continued with LE strengthening with intermittent seated rest breaks. Pt required frequent seated rest breaks due to easy fatigue and feeling winded.  She was monitored closely.  Pt continued to work on balance exercises today, which she was more successful with this session.  She continues to struggle with NBOS, eyes closed but is progressing well.  At next session continue to challenge pt with dynamic balance exercises as well as progress hip and knee strengthening. Encouraged pt to continue HEP. She has not achieved maximal benefit from physical therapy and would benefit from continued skilled PT  services to address deficits in balance, strength, and pain in order to improve function at home and decrease her risk for falls.    Personal Factors and Comorbidities Age;Comorbidity 3+;Past/Current Experience    Examination-Activity Limitations Stand;Locomotion Level;Squat;Stairs    Examination-Participation Restrictions Community Activity    Stability/Clinical Decision Making Evolving/Moderate complexity    Rehab Potential Good    PT Frequency 2x / week    PT Duration 8 weeks    PT Treatment/Interventions ADLs/Self Care Home Management;DME Instruction;Gait training;Stair training;Functional mobility training;Therapeutic activities;Therapeutic exercise;Balance training;Neuromuscular re-education;Patient/family education;Energy conservation    PT Next Visit Plan Progress note, balance and strength, hip abductor strengthening to improve trendelenburg during gait, work on reciprocal arm swing.    PT Home Exercise Plan Access Code: [email or web address]           Patient will benefit from skilled therapeutic intervention in order to improve the following deficits and impairments:  Abnormal gait,Pain,Improper body mechanics,Decreased mobility,Postural dysfunction,Decreased activity tolerance,Decreased endurance,Decreased strength,Decreased balance,Difficulty walking  Visit Diagnosis: Muscle weakness (generalized)  At risk for falls  Other abnormalities of gait and mobility     Problem List Patient Active Problem List   Diagnosis Date Noted  . Current use of long term anticoagulation 05/07/2019  . Dizziness 05/07/2019  . Hypertension 04/26/2015  . Dry mouth 04/26/2015  . Morbid obesity ([place]) 09/04/2014  . Hypokalemia 08/12/2014  . Allergic rhinitis 08/04/2014  . Anxiety 08/04/2014  . Cannot sleep 08/04/2014  . Neuropathic pain 08/04/2014  . Atrial fibrillation ([place]) 08/04/2014  . Avitaminosis D 08/04/2014  . Gastroesophageal reflux disease without esophagitis 11/07/2013  . Polypharmacy  08/19/2013  . Osteoarthritis 06/11/2013  . Rheumatoid arthritis ([place]) 06/11/2013  .  Hypercholesteremia 05/12/2009  . Benign essential tremor 05/12/2009  . Prediabetes 08/26/2008  . Adult hypothyroidism 08/26/2008  . Menopausal symptom 08/26/2008    This entire session was performed under direct supervision and direction of a licensed therapist/therapist assistant . I have personally read, edited and approve of the note as written.   Rosalee Kaufman, SPT  Phillips Grout PT, DPT, GCS  Huprich,Jason 05/03/2020, 6:16 PM  [facility] [address] [place], [place], [address] Phone: [phone]   Fax:  [phone]  Name: Glenda Williams MRN: [id number] Date of Birth: [birth year]

## 2020-05-03 NOTE — Therapy (Deleted)
[facility] [address] [place], [place], [address] Phone: [phone]   Fax:  [phone]  Physical Therapy Treatment  Patient Details  Name: Glenda Williams MRN: [id number] Date of Birth: 07/15/1940 Referring Provider (PT): Vladimir Crofts, MD   Encounter Date: 05/03/2020    Past Medical History:  Diagnosis Date  . A-fib ([place])   . Arthritis   . Bell palsy 09/04/2014  . GERD (gastroesophageal reflux disease)   . Heart disease   . Rheumatoid arthritis ([place])   . Thyroid disease   . Tremors of nervous system     Past Surgical History:  Procedure Laterality Date  . BREAST BIOPSY Left 1988   Benign  . BREAST BIOPSY Left 1987   Benign  . BREAST EXCISIONAL BIOPSY    . BROW LIFT Bilateral 05/23/2016   Procedure: BLEPHAROPLASTY upper eyelid; w/excess skin;  Surgeon: Karle Starch, MD;  Location: [facility];  Service: Ophthalmology;  Laterality: Bilateral;  . CARPAL TUNNEL RELEASE Bilateral   . CHOLECYSTECTOMY     Dr. Bary Castilla  . COLONOSCOPY  2006  . JOINT REPLACEMENT Bilateral    knees  . REPLACEMENT TOTAL KNEE Left 2005  . REPLACEMENT TOTAL KNEE Right 1998    There were no vitals filed for this visit.   Subjective Assessment - 05/03/20 1442    Subjective Pt doing well today. Denies minimal LE soreness upon arrival. Complains of R wrist pain which she rates as 4/10 upon arrival. No falls or stumbles since last therapy session. No specific questions upon arrival today.    Pertinent History Pt is a pleasant 80 y.o. female referred to PT for parkinsonisms and imbalance. Pt has a PMH of Afib, RA, valvular disease, HTN, hypercholesterolemia, and obesity. Pt reports balance difficulties in the past 6-8 months with 2 falls falling in her office at her home tripping over a rug falling into the corner of her desk. Pt feels most off balance when she is tired or stressed she begins feeling unsteady. Pt denies dizziness or  lightheadedness. Pt believes she has better balance without shoes on but has new SAS shoes that have been helpful for her. Pt displays difficulty walking on unstable surfaces like grass and pt has difficulty with stairs. Pt lives in a [facility] with flat entrance and accessible shower and elevated toilet with 42" doorways with wide hallways and lives with her husband. Pt's goal with PT is to improve her walking abilities and endurance.    How long can you stand comfortably? 1 hour    How long can you walk comfortably? 15-20 min depending on walking surface.    Patient Stated Goals Improve walking endurance.    Pain Onset In the past 7 days           TREATMENT   Ther-ex Nu-Step L57fr 532m LE's only during history (seat position11),3 minutesunbilled; Seatedclams withgreen tband3s holdx 30 BLE; Seated adductor ball squeeze 3s hold x 30BLE; Seated LAQ x15 BLE  Standingheel raises with minimal UE supportx 20; Standing hip abduction with 4# ankle weights x 15 BLE; Standing marches with 4# ankle weights x 40 alternating BLE; Standing HS curls with 4# ankle weights x 15 BLE; Standing hip extension with 4# ankle weights x 12 BLE; Alternating 6" step ups with BUE support x 15 on each side;     Neuromuscular Re-education  All balance exercises performed without UE support unless otherwise specified; Airex WBOS eyes open/closed x 30s each; Airex  NBOS eyes open/closed x 30s each;  Standing on half foam roller, eyes open, 2x30s  Airex NBOS balloon passes with student PT x 3 minutes;   Pt educated throughout session about proper posture and technique with exercises. Improved exercise technique, movement at target joints, use of target muscles after min to mod verbal, visual, tactile cues.    Pt arrived late but session was extended due to block in schedule. Continued with LE strengthening with intermittent seated rest breaks.Pt required frequent seated rest breaks due to easy  fatigue and feeling winded.  She was monitored closely.  Pt continued to work on balance exercises today, which she was more successful with this session.  She continues to struggle with NBOS, eyes closed but is progressing well.  At next session continue to challenge pt with dynamic balance exercises as well as progress hip and knee strengthening. Encouraged pt to continue HEP. She has not achieved maximal benefit from physical therapy and would benefit from continued skilled PT services to address deficits in balance, strength, and pain in order to improve function at home and decrease her risk for falls.                               PT Long Term Goals - 04/19/20 1454      PT LONG TERM GOAL #1   Title Pt will improve FOTO score to 60 to display significant improvements in functional mobility.    Baseline 11/8: 48; 02/11/20: 40; 04/19/20: 54    Time 8    Period Weeks    Status Partially Met    Target Date 06/02/20      PT LONG TERM GOAL #2   Title Pt will improve 30 sec STS to 13 reps to match appropriate age ranges for clinically significant LE strength for comunity level ADL's    Baseline 11/8: 6 reps, no UE support; 02/11/20: 11 reps, no UE support; 04/19/20: 9.5 reps, no UE support    Time 8    Period Weeks    Status Partially Met    Target Date 06/02/20      PT LONG TERM GOAL #3   Title Pt will improve TUG with SPC to < 12 sec to display decreased risk of falls.    Baseline 11/8: 14. 66 sec; 02/11/20: 11.9s; 04/19/20: 11.8s;    Time 4    Period Weeks    Status Achieved      PT LONG TERM GOAL #4   Title Pt will improve Berg to > 52/56 to display significant decrease in falls risk with home and community tasks.    Baseline 11/8: 45/56; 02/11/20: 46/56; 04/19/20: 46/56    Time 8    Period Weeks    Status Partially Met    Target Date 06/02/20      PT LONG TERM GOAL #5   Title Pt will asc/desc full flight of stairs with SPC and single handrail and  reciprocal pattern with < 2/10 pain via NPS to improve community level tasks.    Baseline 11/8: Asc/desc 4 stairs with reciprocal pattern, SPC, and handrail with up to 5/10 NPS and reports of difficulty. Good safety. 02/11/20 reciprocal with BUE support, able to perform with single point cane as well with single rail, pain increases in back to 5/10; 04/19/20: unchanged    Time 8    Period Weeks    Status On-going    Target Date 06/02/20  PT LONG TERM GOAL #6   Title Pt will improve DGI with no AD to >15 to improve community ambulation tasks.    Baseline 11/10: 10/24; 02/11/20: 16/24 with single point cane, unable to perform without assistive device at this time;    Time 8    Period Weeks    Status On-going    Target Date 06/02/20                  Patient will benefit from skilled therapeutic intervention in order to improve the following deficits and impairments:     Visit Diagnosis: No diagnosis found.     Problem List Patient Active Problem List   Diagnosis Date Noted  . Current use of long term anticoagulation 05/07/2019  . Dizziness 05/07/2019  . Hypertension 04/26/2015  . Dry mouth 04/26/2015  . Morbid obesity ([place]) 09/04/2014  . Hypokalemia 08/12/2014  . Allergic rhinitis 08/04/2014  . Anxiety 08/04/2014  . Cannot sleep 08/04/2014  . Neuropathic pain 08/04/2014  . Atrial fibrillation ([place]) 08/04/2014  . Avitaminosis D 08/04/2014  . Gastroesophageal reflux disease without esophagitis 11/07/2013  . Polypharmacy 08/19/2013  . Osteoarthritis 06/11/2013  . Rheumatoid arthritis ([place]) 06/11/2013  . Hypercholesteremia 05/12/2009  . Benign essential tremor 05/12/2009  . Prediabetes 08/26/2008  . Adult hypothyroidism 08/26/2008  . Menopausal symptom 08/26/2008   Rosalee Kaufman, SPT  Knute Neu 05/03/2020, 2:42 PM  [facility] [address] [place], [place], [address] Phone: [phone]   Fax:   [phone]  Name: Glenda Williams MRN: [id number] Date of Birth: Feb 07, 1941

## 2020-05-07 ENCOUNTER — Ambulatory Visit: Payer: Medicare Other

## 2020-05-11 ENCOUNTER — Ambulatory Visit: Payer: Medicare Other

## 2020-05-13 NOTE — Patient Instructions (Incomplete)
Progress note: update goals   TREATMENT   Ther-ex Nu-Step L35for5min LE's only during history (seat position11),3 minutesunbilled; Seatedclams withgreen tband3s holdx30BLE; Seated adductor ball squeeze 3s hold x 30BLE; Seated LAQ x15 BLE  Standingheel raises with minimal UEsupportx20; Standing hip abduction with 4# ankle weights x 15 BLE; Standing marches with 4# ankle weights x 40 alternating BLE; Standing HS curls with 4# ankle weights x 15 BLE; Standing hip extension with 4# ankle weights x 12 BLE; Alternating 6" step ups with BUE support x 15 on each side;   Neuromuscular Re-education  All balance exercises performed without UE support unless otherwise specified; Airex WBOS eyes open/closed x 30s each; Airex NBOS eyes open/closed x 30s each;  Standing on half foam roller, eyes open, 2x30s  Airex NBOS balloon passes with student PT x 3 minutes;   Pt educated throughout session about proper posture and technique with exercises. Improved exercise technique, movement at target joints, use of target muscles after min to mod verbal, visual, tactile cues.    Pt arrived late but session was extended due to block in schedule.Continued with LE strengtheningwith intermittent seated rest breaks.Pt required frequent seated rest breaks due to easy fatigue and feeling winded.  She was monitored closely.  Pt continued to work on balance exercises today, which she was more successful with this session.  She continues to struggle with NBOS, eyes closed but is progressing well.  At next session continue to challenge pt with dynamic balance exercises as well as progress hip and knee strengthening. Encouraged pt to continue HEP. She has not achieved maximal benefit from physical therapy and would benefit from continued skilled PT services to address deficits in balance, strength, and pain in order to improve function at home and decrease her risk for falls.

## 2020-05-14 ENCOUNTER — Ambulatory Visit: Payer: Medicare Other

## 2020-05-14 DIAGNOSIS — M6281 Muscle weakness (generalized): Secondary | ICD-10-CM

## 2020-05-14 DIAGNOSIS — R2689 Other abnormalities of gait and mobility: Secondary | ICD-10-CM

## 2020-05-17 DIAGNOSIS — M25511 Pain in right shoulder: Secondary | ICD-10-CM | POA: Diagnosis not present

## 2020-05-17 DIAGNOSIS — M0579 Rheumatoid arthritis with rheumatoid factor of multiple sites without organ or systems involvement: Secondary | ICD-10-CM | POA: Diagnosis not present

## 2020-05-17 DIAGNOSIS — G8929 Other chronic pain: Secondary | ICD-10-CM | POA: Diagnosis not present

## 2020-05-17 DIAGNOSIS — G252 Other specified forms of tremor: Secondary | ICD-10-CM | POA: Diagnosis not present

## 2020-05-17 LAB — CBC AND DIFFERENTIAL
HCT: 40 (ref 36–46)
Hemoglobin: 12.4 (ref 12.0–16.0)
Platelets: 293 (ref 150–399)
WBC: 9.4

## 2020-05-17 LAB — CBC: RBC: 4.36 (ref 3.87–5.11)

## 2020-05-18 ENCOUNTER — Ambulatory Visit (INDEPENDENT_AMBULATORY_CARE_PROVIDER_SITE_OTHER): Payer: Medicare Other | Admitting: Family Medicine

## 2020-05-18 ENCOUNTER — Other Ambulatory Visit: Payer: Self-pay

## 2020-05-18 ENCOUNTER — Encounter: Payer: Self-pay | Admitting: Family Medicine

## 2020-05-18 VITALS — BP 127/79 | HR 69 | Temp 98.1°F | Wt 229.0 lb

## 2020-05-18 DIAGNOSIS — R7303 Prediabetes: Secondary | ICD-10-CM | POA: Diagnosis not present

## 2020-05-18 DIAGNOSIS — E039 Hypothyroidism, unspecified: Secondary | ICD-10-CM

## 2020-05-18 DIAGNOSIS — M069 Rheumatoid arthritis, unspecified: Secondary | ICD-10-CM

## 2020-05-18 DIAGNOSIS — I10 Essential (primary) hypertension: Secondary | ICD-10-CM

## 2020-05-18 DIAGNOSIS — I480 Paroxysmal atrial fibrillation: Secondary | ICD-10-CM | POA: Diagnosis not present

## 2020-05-18 DIAGNOSIS — E7800 Pure hypercholesterolemia, unspecified: Secondary | ICD-10-CM

## 2020-05-18 NOTE — Assessment & Plan Note (Signed)
Well controlled Continue current medications Recheck metabolic panel F/u in 6 months  

## 2020-05-18 NOTE — Assessment & Plan Note (Signed)
BMI 37 and assoc with HTN and HLD Discussed importance of healthy weight management Discussed diet and exercise

## 2020-05-18 NOTE — Assessment & Plan Note (Addendum)
Rate controlled, in afib today Followed by cardiology  reviewed recent CBC Continue BB and Xarelto - no samples available today

## 2020-05-18 NOTE — Assessment & Plan Note (Signed)
Recommend low carb diet °Recheck A1c  °

## 2020-05-18 NOTE — Assessment & Plan Note (Signed)
followed by rheum Infusion yesterday

## 2020-05-18 NOTE — Assessment & Plan Note (Signed)
Previously well controlled Continue Synthroid at current dose  Recheck TSH and adjust Synthroid as indicated   

## 2020-05-18 NOTE — Assessment & Plan Note (Signed)
Well controlled on last check No chagnes to medications Recheck lipids at next visit in 81m

## 2020-05-18 NOTE — Progress Notes (Signed)
Established patient visit   Patient: Glenda Williams   DOB: 09-04-1940   80 y.o. Female  MRN: [id number] Visit Date: 05/18/2020  Today's healthcare provider: Lavon Paganini, MD   Chief Complaint  Patient presents with  . Hypertension  . Hyperlipidemia   Subjective    HPI   Hypertension, follow-up  BP Readings from Last 3 Encounters:  05/18/20 127/79  03/29/20 (!) 143/50  02/11/20 (!) 156/61   Wt Readings from Last 3 Encounters:  05/18/20 229 lb (103.9 kg)  11/18/19 225 lb (102.1 kg)  05/07/19 233 lb (105.7 kg)     She was last seen for hypertension 6 months ago.  Management since that visit inclu des no changes. She reports excellent compliance with treatment. She is not having side effects.  She is adherent to low salt diet.   Outside blood pressures are normal at home.  She does not smoke.  Use of agents associated with hypertension: none.   --------------------------------------------------------------------------------------------------- Lipid/Cholesterol, follow-up  Last Lipid Panel: Lab Results  Component Value Date   CHOL 148 11/18/2019   LDLCALC 99 11/18/2019   HDL 33 (L) 11/18/2019   TRIG 85 11/18/2019    She was last seen for this 6 months ago.  Management since that visit includes no changes .  She reports excellent compliance with treatment. She is not having side effects.   Symptoms: No appetite changes No foot ulcerations  No chest pain No chest pressure/discomfort  No dyspnea No orthopnea  No fatigue No lower extremity edema  No palpitations No paroxysmal nocturnal dyspnea  No nausea No numbness or tingling of extremity  No polydipsia No polyuria  No speech difficulty No syncope   She is following a Regular diet. Current exercise: Pt is doing Physical Therapy right now.  Last metabolic panel Lab Results  Component Value Date   GLUCOSE 86 11/18/2019   NA 141 11/18/2019   K 3.9 11/18/2019   BUN 11 11/18/2019    CREATININE 0.84 11/18/2019   GFRNONAA 67 11/18/2019   GFRAA 77 11/18/2019   CALCIUM 9.5 11/18/2019   AST 18 11/18/2019   ALT 12 11/18/2019   The 10-year ASCVD risk score Mikey Bussing DC Jr., et al., 2013) is: 26.4%  ---------------------------------------------------------------------------------------------------   Patient Active Problem List   Diagnosis Date Noted  . Current use of long term anticoagulation 05/07/2019  . Dizziness 05/07/2019  . Hypertension 04/26/2015  . Dry mouth 04/26/2015  . Morbid obesity ([place]) 09/04/2014  . Hypokalemia 08/12/2014  . Allergic rhinitis 08/04/2014  . Anxiety 08/04/2014  . Cannot sleep 08/04/2014  . Neuropathic pain 08/04/2014  . Atrial fibrillation ([place]) 08/04/2014  . Avitaminosis D 08/04/2014  . Gastroesophageal reflux disease without esophagitis 11/07/2013  . Polypharmacy 08/19/2013  . Osteoarthritis 06/11/2013  . Rheumatoid arthritis ([place]) 06/11/2013  . Hypercholesteremia 05/12/2009  . Benign essential tremor 05/12/2009  . Prediabetes 08/26/2008  . Adult hypothyroidism 08/26/2008  . Menopausal symptom 08/26/2008   Past Medical History:  Diagnosis Date  . A-fib ([place])   . Arthritis   . Bell palsy 09/04/2014  . GERD (gastroesophageal reflux disease)   . Heart disease   . Rheumatoid arthritis ([place])   . Thyroid disease   . Tremors of nervous system    Social History   Tobacco Use  . Smoking status: Never Smoker  . Smokeless tobacco: Never Used  Vaping Use  . Vaping Use: Never used  Substance Use Topics  . Alcohol use: No  . Drug use:  No   Allergies  Allergen Reactions  . Bactrim [Sulfamethoxazole-Trimethoprim] Rash  . Macrobid [Nitrofurantoin Macrocrystal] Rash  . Mirabegron Rash  . Oxybutynin Rash  . Penicillins Rash    Has patient had a PCN reaction causing immediate rash, facial/tongue/throat swelling, SOB or lightheadedness with hypotension: Yes Has patient had a PCN reaction causing severe rash involving mucus membranes  or skin necrosis: No Has patient had a PCN reaction that required hospitalization: No Has patient had a PCN reaction occurring within the last 10 years: Unknown If all of the above answers are "NO", then may proceed with Cephalosporin use.      Medications: Outpatient Medications Prior to Visit  Medication Sig  . Abatacept (ORENCIA [place]) Inject into the skin as directed. Every 4-6 weeks  . cetirizine (ZYRTEC) 10 MG tablet Take 10 mg by mouth daily as needed.   . cholecalciferol (VITAMIN D) 400 units TABS tablet Take 400 Units by mouth daily.  Marland Kitchen EPINEPHrine 0.3 mg/0.3 mL IJ SOAJ injection Inject 0.3 mLs into the skin once as needed for anaphylaxis.  . folic acid (FOLVITE) 1 MG tablet Take 1 mg by mouth daily.  . hydrochlorothiazide (HYDRODIURIL) 12.5 MG tablet Take 1 tablet (12.5 mg total) by mouth daily.  . hydrocortisone cream 1 % Apply 1 application topically as needed for itching.  . levothyroxine (SYNTHROID) 100 MCG tablet Take 1 tablet (100 mcg total) by mouth daily.  . methotrexate (RHEUMATREX) 2.5 MG tablet Take 20 mg by mouth every Thursday.   . Omega-3 Fatty Acids (FISH OIL) 1000 MG CAPS Take 1 capsule by mouth daily.  Marland Kitchen omeprazole (PRILOSEC) 20 MG capsule TAKE 1 CAPSULE BY MOUTH 2  TIMES DAILY BEFORE MEALS  . potassium chloride SA (K-DUR,KLOR-CON) 20 MEQ tablet Take 1 tablet by mouth  daily  . propranolol (INDERAL) 40 MG tablet 80 mg in the am and 40 mg at night  . Red Yeast Rice 600 MG CAPS Take 1 capsule by mouth daily.  . rivaroxaban (XARELTO) 20 MG TABS tablet TAKE 1 TABLET BY MOUTH ONCE DAILY  . sertraline (ZOLOFT) 50 MG tablet Take 3 tablets (150 mg total) by mouth daily.   No facility-administered medications prior to visit.    Review of Systems  Constitutional: Negative.   Respiratory: Negative.   Cardiovascular: Negative.   Gastrointestinal: Negative.   Musculoskeletal: Positive for arthralgias (Pt states her RA is flaring up.). Negative for back pain, gait  problem, joint swelling, myalgias, neck pain and neck stiffness.  Allergic/Immunologic: Positive for environmental allergies.  Neurological: Negative for dizziness, light-headedness and headaches.        Objective    BP 127/79 (BP Location: Left Arm, Patient Position: Sitting, Cuff Size: Large)   Pulse 69   Temp 98.1 F (36.7 C) (Oral)   Wt 229 lb (103.9 kg)   BMI 37.53 kg/m    Physical Exam Vitals reviewed.  Constitutional:      General: She is not in acute distress.    Appearance: Normal appearance. She is well-developed. She is not diaphoretic.  HENT:     Head: Normocephalic and atraumatic.  Eyes:     General: No scleral icterus.    Conjunctiva/sclera: Conjunctivae normal.  Neck:     Thyroid: No thyromegaly.  Cardiovascular:     Rate and Rhythm: Normal rate. Rhythm irregularly irregular.     Pulses: Normal pulses.     Heart sounds: Normal heart sounds. No murmur heard.   Pulmonary:     Effort: Pulmonary effort  is normal. No respiratory distress.     Breath sounds: Normal breath sounds. No wheezing, rhonchi or rales.  Musculoskeletal:     Cervical back: Neck supple.     Right lower leg: No edema.     Left lower leg: No edema.  Lymphadenopathy:     Cervical: No cervical adenopathy.  Skin:    General: Skin is warm and dry.     Findings: No rash.  Neurological:     Mental Status: She is alert and oriented to person, place, and time. Mental status is at baseline.  Psychiatric:        Mood and Affect: Mood normal.        Behavior: Behavior normal.       No results found for any visits on 05/18/20.  Assessment & Plan     Problem List Items Addressed This Visit      Cardiovascular and Mediastinum   Atrial fibrillation ([place])    Rate controlled, in afib today Followed by cardiology  reviewed recent CBC Continue BB and Xarelto - no samples available today      Hypertension - Primary    Well controlled Continue current medications Recheck metabolic  panel F/u in 6 months       Relevant Orders   Basic Metabolic Panel (BMET)     Endocrine   Adult hypothyroidism    Previously well controlled Continue Synthroid at current dose  Recheck TSH and adjust Synthroid as indicated       Relevant Orders   TSH     Musculoskeletal and Integument   Rheumatoid arthritis ([place])    followed by rheum Infusion yesterday        Other   Prediabetes    Recommend low carb diet Recheck A1c      Relevant Orders   Hemoglobin A1c   Hypercholesteremia    Well controlled on last check No chagnes to medications Recheck lipids at next visit in 19m      Morbid obesity ([place])    BMI 37 and assoc with HTN and HLD Discussed importance of healthy weight management Discussed diet and exercise           Return in about 6 months (around 11/18/2020) for AWV, chronic disease f/u.      I, Lavon Paganini, MD, have reviewed all documentation for this visit. The documentation on 05/18/20 for the exam, diagnosis, procedures, and orders are all accurate and complete.   [place], Dionne Bucy, MD, MPH [facility]

## 2020-05-19 ENCOUNTER — Other Ambulatory Visit: Payer: Self-pay | Admitting: Family Medicine

## 2020-05-19 DIAGNOSIS — F419 Anxiety disorder, unspecified: Secondary | ICD-10-CM

## 2020-05-19 LAB — BASIC METABOLIC PANEL
BUN/Creatinine Ratio: 24 (ref 12–28)
BUN: 16 mg/dL (ref 8–27)
CO2: 24 mmol/L (ref 20–29)
Calcium: 9.2 mg/dL (ref 8.7–10.3)
Chloride: 100 mmol/L (ref 96–106)
Creatinine, Ser: 0.68 mg/dL (ref 0.57–1.00)
Glucose: 86 mg/dL (ref 65–99)
Potassium: 3.9 mmol/L (ref 3.5–5.2)
Sodium: 139 mmol/L (ref 134–144)
eGFR: 89 mL/min/{1.73_m2} (ref >59)

## 2020-05-19 LAB — HEMOGLOBIN A1C
Est. average glucose Bld gHb Est-mCnc: 123 mg/dL
Hgb A1c MFr Bld: 5.9 % — ABNORMAL HIGH (ref 4.8–5.6)

## 2020-05-19 LAB — TSH: TSH: 3.73 u[IU]/mL (ref 0.450–4.500)

## 2020-05-19 NOTE — Telephone Encounter (Signed)
Requested Prescriptions  Pending Prescriptions Disp Refills  . sertraline (ZOLOFT) 50 MG tablet [Pharmacy Med Name: Sertraline HCl 50 MG Oral Tablet] 270 tablet 1    Sig: TAKE 3 TABLETS BY MOUTH  DAILY     Psychiatry:  Antidepressants - SSRI Passed - 05/19/2020  5:12 AM      Passed - Valid encounter within last 6 months    Recent Outpatient Visits          Yesterday Primary hypertension   [facility] [place], Dionne Bucy, MD   6 months ago Essential hypertension   [place], Dionne Bucy, MD   1 year ago Essential hypertension   [facility] [place], Dionne Bucy, MD   1 year ago Acute cystitis without hematuria   [facility], Dionne Bucy, MD   1 year ago Cystitis   [facility] [place], Dionne Bucy, MD

## 2020-05-21 ENCOUNTER — Ambulatory Visit: Payer: Medicare Other | Attending: Neurology

## 2020-05-24 ENCOUNTER — Ambulatory Visit: Payer: Medicare Other

## 2020-05-24 NOTE — Patient Instructions (Incomplete)
Physical Therapy Progress Note   Dates of reporting period  ***   to   ***   Update goals   TREATMENT   Ther-ex Nu-Step L52for5min LE's only during history (seat position11),3 minutesunbilled; Seatedclams withgreen tband3s holdx30BLE; Seated adductor ball squeeze 3s hold x 30BLE; Seated LAQ x15 BLE  Standingheel raises with minimal UEsupportx20; Standing hip abduction with 4# ankle weights x 15 BLE; Standing marches with 4# ankle weights x 40 alternating BLE; Standing HS curls with 4# ankle weights x 15 BLE; Standing hip extension with 4# ankle weights x 12 BLE; Alternating 6" step ups with BUE support x 15 on each side;     Neuromuscular Re-education  All balance exercises performed without UE support unless otherwise specified; Airex WBOS eyes open/closed x 30s each; Airex NBOS eyes open/closed x 30s each;  Standing on half foam roller, eyes open, 2x30s  Airex NBOS balloon passes with student PT x 3 minutes;   Pt educated throughout session about proper posture and technique with exercises. Improved exercise technique, movement at target joints, use of target muscles after min to mod verbal, visual, tactile cues.    Pt arrived late but session was extended due to block in schedule.Continued with LE strengtheningwith intermittent seated rest breaks.Pt required frequent seated rest breaks due to easy fatigue and feeling winded.  She was monitored closely.  Pt continued to work on balance exercises today, which she was more successful with this session.  She continues to struggle with NBOS, eyes closed but is progressing well.  At next session continue to challenge pt with dynamic balance exercises as well as progress hip and knee strengthening. Encouraged pt to continue HEP. She has not achieved maximal benefit from physical therapy and would benefit from continued skilled PT services to address deficits in balance, strength, and pain in order to  improve function at home and decrease her risk for falls.

## 2020-05-27 ENCOUNTER — Ambulatory Visit: Payer: Medicare Other

## 2020-06-02 ENCOUNTER — Other Ambulatory Visit: Payer: Self-pay | Admitting: Family Medicine

## 2020-06-11 ENCOUNTER — Encounter: Payer: Self-pay | Admitting: Family Medicine

## 2020-06-15 DIAGNOSIS — M0579 Rheumatoid arthritis with rheumatoid factor of multiple sites without organ or systems involvement: Secondary | ICD-10-CM | POA: Diagnosis not present

## 2020-07-05 DIAGNOSIS — R2689 Other abnormalities of gait and mobility: Secondary | ICD-10-CM | POA: Diagnosis not present

## 2020-07-05 DIAGNOSIS — G4752 REM sleep behavior disorder: Secondary | ICD-10-CM | POA: Diagnosis not present

## 2020-07-05 DIAGNOSIS — G20 Parkinson's disease: Secondary | ICD-10-CM | POA: Diagnosis not present

## 2020-07-05 DIAGNOSIS — G250 Essential tremor: Secondary | ICD-10-CM | POA: Diagnosis not present

## 2020-07-10 ENCOUNTER — Other Ambulatory Visit: Payer: Self-pay | Admitting: Family Medicine

## 2020-07-11 NOTE — Telephone Encounter (Signed)
Requested Prescriptions  Pending Prescriptions Disp Refills  . omeprazole (PRILOSEC) 20 MG capsule [Pharmacy Med Name: Omeprazole 20 MG Oral Capsule Delayed Release] 180 capsule 1    Sig: TAKE 1 CAPSULE BY MOUTH 2  TIMES DAILY BEFORE MEALS     Gastroenterology: Proton Pump Inhibitors Passed - 07/10/2020  6:45 PM      Passed - Valid encounter within last 12 months    Recent Outpatient Visits          1 month ago Primary hypertension   [facility] [place], Dionne Bucy, MD   7 months ago Essential hypertension   [place], Dionne Bucy, MD   1 year ago Essential hypertension   [facility] [place], Dionne Bucy, MD   1 year ago Acute cystitis without hematuria   [facility], Dionne Bucy, MD   1 year ago Cystitis   [facility] [place], Dionne Bucy, MD

## 2020-07-13 DIAGNOSIS — M069 Rheumatoid arthritis, unspecified: Secondary | ICD-10-CM | POA: Diagnosis not present

## 2020-08-07 ENCOUNTER — Other Ambulatory Visit: Payer: Self-pay | Admitting: Family Medicine

## 2020-08-07 DIAGNOSIS — I10 Essential (primary) hypertension: Secondary | ICD-10-CM

## 2020-08-08 NOTE — Telephone Encounter (Signed)
Requested Prescriptions  Pending Prescriptions Disp Refills  . hydrochlorothiazide (HYDRODIURIL) 12.5 MG tablet [Pharmacy Med Name: hydroCHLOROthiazide 12.5 MG Oral Tablet] 90 tablet 1    Sig: TAKE 1 TABLET BY MOUTH  DAILY     Cardiovascular: Diuretics - Thiazide Passed - 08/07/2020 10:50 PM      Passed - Ca in normal range and within 360 days    Calcium  Date Value Ref Range Status  05/18/2020 9.2 8.7 - 10.3 mg/dL Final   Calcium, Total  Date Value Ref Range Status  02/13/2014 8.6 8.5 - 10.1 mg/dL Final         Passed - Cr in normal range and within 360 days    Creatinine  Date Value Ref Range Status  02/13/2014 0.77 0.60 - 1.30 mg/dL Final   Creatinine, Ser  Date Value Ref Range Status  05/18/2020 0.68 0.57 - 1.00 mg/dL Final         Passed - K in normal range and within 360 days    Potassium  Date Value Ref Range Status  05/18/2020 3.9 3.5 - 5.2 mmol/L Final  02/13/2014 3.8 3.5 - 5.1 mmol/L Final         Passed - Na in normal range and within 360 days    Sodium  Date Value Ref Range Status  05/18/2020 139 134 - 144 mmol/L Final  02/13/2014 142 136 - 145 mmol/L Final         Passed - Last BP in normal range    BP Readings from Last 1 Encounters:  05/18/20 127/79         Passed - Valid encounter within last 6 months    Recent Outpatient Visits          2 months ago Primary hypertension   [facility] [place], Dionne Bucy, MD   8 months ago Essential hypertension   [place], Dionne Bucy, MD   1 year ago Essential hypertension   [place], Dionne Bucy, MD   1 year ago Acute cystitis without hematuria   [place], Dionne Bucy, MD   1 year ago Cystitis   [facility] [place], Dionne Bucy, MD

## 2020-08-10 DIAGNOSIS — M069 Rheumatoid arthritis, unspecified: Secondary | ICD-10-CM | POA: Diagnosis not present

## 2020-10-04 DIAGNOSIS — M069 Rheumatoid arthritis, unspecified: Secondary | ICD-10-CM | POA: Diagnosis not present

## 2020-10-06 ENCOUNTER — Telehealth: Payer: Self-pay

## 2020-10-06 NOTE — Telephone Encounter (Signed)
Apt with Daneil Dan 10/07/2020 at 9:40, pt advised.   Thanks,   -Mickel Baas

## 2020-10-06 NOTE — Telephone Encounter (Signed)
Copied from [place] [phone]. Topic: Appointment Scheduling - Scheduling Inquiry for Clinic >> Oct 06, 2020  9:45 AM Lennox Solders wrote: Reason for CRM: Pt is calling and having burning when urinating and pain . Pt states she has uti and per pt dr b told her to call and she will work in her. Pt is aware dr b is not in office today and she can wait until tomorrow. Please advise

## 2020-10-06 NOTE — Telephone Encounter (Signed)
Patient has appt scheduled tomorrow with Daneil Dan, NP.

## 2020-10-06 NOTE — Telephone Encounter (Signed)
Copied from [place] [phone]. Topic: Appointment Scheduling - Scheduling Inquiry for Clinic >> Oct 06, 2020  9:45 AM Lennox Solders wrote: Reason for CRM: Pt is calling and having burning when urinating and pain . Pt states she has uti and per pt dr b told her to call and she will work in her. Pt is aware dr b is not in office today and she can wait until tomorrow. Please advise

## 2020-10-07 ENCOUNTER — Ambulatory Visit (INDEPENDENT_AMBULATORY_CARE_PROVIDER_SITE_OTHER): Payer: Medicare Other | Admitting: Family Medicine

## 2020-10-07 ENCOUNTER — Other Ambulatory Visit: Payer: Self-pay

## 2020-10-07 ENCOUNTER — Encounter: Payer: Self-pay | Admitting: Family Medicine

## 2020-10-07 VITALS — BP 155/72 | HR 73 | Temp 98.1°F | Resp 18 | Ht 66.0 in | Wt 243.0 lb

## 2020-10-07 DIAGNOSIS — R300 Dysuria: Secondary | ICD-10-CM | POA: Diagnosis not present

## 2020-10-07 DIAGNOSIS — M4713 Other spondylosis with myelopathy, cervicothoracic region: Secondary | ICD-10-CM | POA: Diagnosis not present

## 2020-10-07 DIAGNOSIS — I480 Paroxysmal atrial fibrillation: Secondary | ICD-10-CM

## 2020-10-07 DIAGNOSIS — N3001 Acute cystitis with hematuria: Secondary | ICD-10-CM | POA: Diagnosis not present

## 2020-10-07 DIAGNOSIS — I10 Essential (primary) hypertension: Secondary | ICD-10-CM | POA: Diagnosis not present

## 2020-10-07 LAB — POCT URINALYSIS DIPSTICK
Bilirubin, UA: NEGATIVE
Blood, UA: POSITIVE
Glucose, UA: NEGATIVE
Ketones, UA: NEGATIVE
Nitrite, UA: NEGATIVE
Protein, UA: POSITIVE — AB
Spec Grav, UA: 1.02 (ref 1.010–1.025)
Urobilinogen, UA: 0.2 E.U./dL
pH, UA: 6 (ref 5.0–8.0)

## 2020-10-07 MED ORDER — CIPROFLOXACIN HCL 250 MG PO TABS: 250.0000 mg | ORAL_TABLET | Freq: Two times a day (BID) | ORAL | 0 refills | Status: AC

## 2020-10-07 NOTE — Progress Notes (Signed)
I, April Sabra Heck, acting as a Education administrator for [name], FNP., have documented all relevant documentation on the behalf of [name], FNP, as directed by  [name], FNP while in the presence of [name], FNP.  Established patient visit   Patient: Glenda Williams   DOB: Nov 01, 1940   80 y.o. Female  MRN: [id number] Visit Date: 10/07/2020  Today's healthcare provider: [name], FNP   Chief Complaint  Patient presents with   Dysuria   Subjective    Dysuria  This is a new problem. The current episode started in the past 7 days (2 days ago). The problem has been gradually worsening. The quality of the pain is described as burning and aching. The pain is at a severity of 5/10. The pain is moderate. There has been no fever. Associated symptoms include frequency and urgency. Pertinent negatives include no chills, discharge, flank pain, hematuria, hesitancy, nausea, possible pregnancy, sweats or vomiting. She has tried nothing for the symptoms.    Patient states she began having burning upon urination 2 days ago. Patient also has symptoms left sided pelvic pain, urine frequency and urgency. Patient treated symptoms at home with increased fluids.      Medications: Outpatient Medications Prior to Visit  Medication Sig   cetirizine (ZYRTEC) 10 MG tablet Take 10 mg by mouth daily as needed.    cholecalciferol (VITAMIN D) 400 units TABS tablet Take 400 Units by mouth daily.   EPINEPHrine 0.3 mg/0.3 mL IJ SOAJ injection Inject 0.3 mLs into the skin once as needed for anaphylaxis.   folic acid (FOLVITE) 1 MG tablet Take 1 mg by mouth daily.   hydrochlorothiazide (HYDRODIURIL) 12.5 MG tablet TAKE 1 TABLET BY MOUTH  DAILY   hydrocortisone cream 1 % Apply 1 application topically as needed for itching.   levothyroxine (SYNTHROID) 100 MCG tablet TAKE 1 TABLET BY MOUTH  DAILY   methotrexate (RHEUMATREX) 2.5 MG tablet Take 20 mg by mouth every Thursday.    Omega-3 Fatty Acids (FISH  OIL) 1000 MG CAPS Take 1 capsule by mouth daily.   omeprazole (PRILOSEC) 20 MG capsule TAKE 1 CAPSULE BY MOUTH 2  TIMES DAILY BEFORE MEALS   potassium chloride SA (K-DUR,KLOR-CON) 20 MEQ tablet Take 1 tablet by mouth  daily   propranolol (INDERAL) 40 MG tablet 80 mg in the am and 40 mg at night   Red Yeast Rice 600 MG CAPS Take 1 capsule by mouth daily.   rivaroxaban (XARELTO) 20 MG TABS tablet TAKE 1 TABLET BY MOUTH ONCE DAILY   sertraline (ZOLOFT) 50 MG tablet TAKE 3 TABLETS BY MOUTH  DAILY   Abatacept (ORENCIA [place]) Inject into the skin as directed. Every 4-6 weeks   No facility-administered medications prior to visit.    Review of Systems  Constitutional:  Negative for appetite change, chills, fatigue and fever.  Respiratory:  Negative for chest tightness and shortness of breath.   Cardiovascular:  Negative for chest pain and palpitations.  Gastrointestinal:  Negative for abdominal pain, nausea and vomiting.  Genitourinary:  Positive for dysuria, frequency and urgency. Negative for flank pain, hematuria and hesitancy.  Neurological:  Negative for dizziness and weakness.      Objective    BP (!) 155/72 (BP Location: Right Arm, Patient Position: Sitting, Cuff Size: Large)   Pulse 73   Temp 98.1 F (36.7 C) (Temporal)   Resp 18   Ht '5\' 6"'$  (1.676 m)  Wt 243 lb (110.2 kg)   SpO2 99%   BMI 39.22 kg/m     Physical Exam Vitals and nursing note reviewed.  Constitutional:      Appearance: Normal appearance. She is obese.  HENT:     Head: Normocephalic.  Cardiovascular:     Rate and Rhythm: Normal rate and regular rhythm.     Pulses: Normal pulses.     Heart sounds: Normal heart sounds. No murmur heard.   No friction rub. No gallop.     Comments: Appears of vascular changes in mid calf to ankles Pulmonary:     Effort: Pulmonary effort is normal.     Breath sounds: Normal breath sounds.  Abdominal:     General: Bowel sounds are normal.     Palpations: Abdomen is soft.      Tenderness: There is no abdominal tenderness. There is right CVA tenderness and left CVA tenderness. There is no guarding or rebound.     Hernia: No hernia is present.     Comments: L >R  Genitourinary:    Comments: Deferred; negative for supra pubic pain Musculoskeletal:        General: Swelling present.     Right lower leg: 2+ Pitting Edema present.     Left lower leg: 2+ Pitting Edema present.  Skin:    General: Skin is warm and dry.     Capillary Refill: Capillary refill takes 2 to 3 seconds.  Neurological:     General: No focal deficit present.     Mental Status: She is alert and oriented to person, place, and time. Mental status is at baseline.  Psychiatric:        Mood and Affect: Mood normal.        Behavior: Behavior normal.        Thought Content: Thought content normal.        Judgment: Judgment normal.      Results for orders placed or performed in visit on 10/07/20  POCT urinalysis dipstick  Result Value Ref Range   Color, UA Dark Yellow    Clarity, UA Turbid    Glucose, UA Negative Negative   Bilirubin, UA Negative    Ketones, UA Negative    Spec Grav, UA 1.020 1.010 - 1.025   Blood, UA Positive    pH, UA 6.0 5.0 - 8.0   Protein, UA Positive (A) Negative   Urobilinogen, UA 0.2 0.2 or 1.0 E.U./dL   Nitrite, UA Negative    Leukocytes, UA Moderate (2+) (A) Negative    Assessment & Plan     Problem List Items Addressed This Visit       Cardiovascular and Mediastinum   Atrial fibrillation (H[facility])    Pt request for samples of AC d/t 'donut hole' of medicare.  Provided per pt's request.  In normal rhythm today on exam       Hypertension    Slight elevation today  Pt with c/o pain r/t urinary symptoms  Continues meds without difficulty  Edema noted in LE- advised elevation, compression and low salt diet         Musculoskeletal and Integument   Osteoarthritis    + CVA tenderness on exam  Pt educated on location of kidneys  Chronic back pain as  well as use of cane- primarily located on R mid-upper back         Genitourinary   Acute cystitis with hematuria - Primary    Dip reveals some hemolyzed blood  in sample  Pt also reported some blood in samples at home  Pt is post-menopause  Cx sent       Relevant Medications   ciprofloxacin (CIPRO) 250 MG tablet   Other Relevant Orders   CULTURE, URINE COMPREHENSIVE     Other   Dysuria    Recent concern  + urgency +inc frequency +pain  CVA tenderness on exam  Cx sent; dip done in office- Rx provided       Relevant Medications   ciprofloxacin (CIPRO) 250 MG tablet   Other Relevant Orders   POCT urinalysis dipstick (Completed)      Return if symptoms worsen or fail to improve.      Vonna Kotyk, FNP, have reviewed all documentation for this visit. The documentation on 10/07/20 for the exam, diagnosis, procedures, and orders are all accurate and complete.    [name], [place] [phone] (phone) [phone] (fax)  [place]

## 2020-10-07 NOTE — Assessment & Plan Note (Signed)
Slight elevation today  Pt with c/o pain r/t urinary symptoms  Continues meds without difficulty  Edema noted in LE- advised elevation, compression and low salt diet

## 2020-10-07 NOTE — Assessment & Plan Note (Signed)
Pt request for samples of AC d/t 'donut hole' of medicare.  Provided per pt's request.  In normal rhythm today on exam

## 2020-10-07 NOTE — Assessment & Plan Note (Addendum)
Recent concern  + urgency +inc frequency +pain  CVA tenderness on exam  Cx sent; dip done in office- Rx provided  Pt aware of wiping hygiene with appropriate teach back  Encouraged OTC pyridium if needed

## 2020-10-07 NOTE — Assessment & Plan Note (Signed)
Dip reveals some hemolyzed blood in sample  Pt also reported some blood in samples at home  Pt is post-menopause  Cx sent

## 2020-10-07 NOTE — Assessment & Plan Note (Signed)
+   CVA tenderness on exam  Pt educated on location of kidneys  Chronic back pain as well as use of cane- primarily located on R mid-upper back

## 2020-10-15 LAB — CULTURE, URINE COMPREHENSIVE

## 2020-10-15 NOTE — Progress Notes (Signed)
Hello,    Your lab results have returned.  Your urine culture grew proteus mirabilis; the antibiotic that we chose at your visit last week should be treating your symptoms and the bacteria.  Please let us know if your symptoms remain and we can retest your urine and if needed add an additional antibiotic.  Please let us know if you have any questions.  Thank you,  Tally Joe, FNP

## 2020-10-19 DIAGNOSIS — Z6837 Body mass index (BMI) 37.0-37.9, adult: Secondary | ICD-10-CM | POA: Diagnosis not present

## 2020-10-19 DIAGNOSIS — E7800 Pure hypercholesterolemia, unspecified: Secondary | ICD-10-CM | POA: Diagnosis not present

## 2020-10-19 DIAGNOSIS — I38 Endocarditis, valve unspecified: Secondary | ICD-10-CM | POA: Diagnosis not present

## 2020-10-19 DIAGNOSIS — I10 Essential (primary) hypertension: Secondary | ICD-10-CM | POA: Diagnosis not present

## 2020-10-19 DIAGNOSIS — Z7901 Long term (current) use of anticoagulants: Secondary | ICD-10-CM | POA: Diagnosis not present

## 2020-10-19 DIAGNOSIS — I480 Paroxysmal atrial fibrillation: Secondary | ICD-10-CM | POA: Diagnosis not present

## 2020-10-19 DIAGNOSIS — E6609 Other obesity due to excess calories: Secondary | ICD-10-CM | POA: Diagnosis not present

## 2020-11-04 ENCOUNTER — Other Ambulatory Visit: Payer: Self-pay | Admitting: Family Medicine

## 2020-11-04 DIAGNOSIS — F419 Anxiety disorder, unspecified: Secondary | ICD-10-CM

## 2020-11-04 NOTE — Telephone Encounter (Signed)
Requested medications are due for refill today.  yes  Requested medications are on the active medications list.  yes  Last refill.  07/22/2019 - Prilosec,  05/19/2020  - Zoloft  Future visit scheduled.   yes  Notes to clinic.  Pt was last seen for both of these on 05/07/2019

## 2020-11-11 ENCOUNTER — Encounter: Payer: Self-pay | Admitting: Family Medicine

## 2020-11-11 ENCOUNTER — Other Ambulatory Visit: Payer: Self-pay

## 2020-11-11 ENCOUNTER — Ambulatory Visit (INDEPENDENT_AMBULATORY_CARE_PROVIDER_SITE_OTHER): Payer: Medicare Other | Admitting: Family Medicine

## 2020-11-11 ENCOUNTER — Ambulatory Visit: Payer: Self-pay

## 2020-11-11 VITALS — BP 135/53 | HR 62 | Temp 98.5°F | Resp 16 | Wt 219.0 lb

## 2020-11-11 DIAGNOSIS — R319 Hematuria, unspecified: Secondary | ICD-10-CM | POA: Diagnosis not present

## 2020-11-11 DIAGNOSIS — N3001 Acute cystitis with hematuria: Secondary | ICD-10-CM

## 2020-11-11 DIAGNOSIS — B372 Candidiasis of skin and nail: Secondary | ICD-10-CM | POA: Insufficient documentation

## 2020-11-11 DIAGNOSIS — E65 Localized adiposity: Secondary | ICD-10-CM | POA: Insufficient documentation

## 2020-11-11 DIAGNOSIS — R109 Unspecified abdominal pain: Secondary | ICD-10-CM | POA: Insufficient documentation

## 2020-11-11 LAB — POCT URINALYSIS DIPSTICK
Glucose, UA: NEGATIVE
Ketones, UA: NEGATIVE
Nitrite, UA: POSITIVE
Protein, UA: POSITIVE — AB
Spec Grav, UA: 1.015 (ref 1.010–1.025)
Urobilinogen, UA: >=8 E.U./dL — AB
pH, UA: 6.5 (ref 5.0–8.0)

## 2020-11-11 MED ORDER — LEVOFLOXACIN 500 MG PO TABS: 500.0000 mg | ORAL_TABLET | Freq: Every day | ORAL | 0 refills | Status: AC

## 2020-11-11 MED ORDER — NYSTATIN 100000 UNIT/GM EX POWD: 1.0000 "application " | Freq: Three times a day (TID) | CUTANEOUS | 1 refills | Status: DC

## 2020-11-11 MED ORDER — KETOCONAZOLE 2 % EX CREA: 1.0000 "application " | TOPICAL_CREAM | Freq: Every day | CUTANEOUS | 2 refills | Status: DC

## 2020-11-11 NOTE — Assessment & Plan Note (Signed)
Some relief since previous UTI tx; dip + today Plan for Ucx as well as micro Push fluids Referral to urology given repeat occurrence and associated symptoms

## 2020-11-11 NOTE — Assessment & Plan Note (Signed)
Large area under abdominal pannus Clears up and then comes back Roughly 15 cm x 8 cm Pink shiny skin No discharge No breakdown No ordor

## 2020-11-11 NOTE — Assessment & Plan Note (Signed)
Grade 2-3; continue to monitor

## 2020-11-11 NOTE — Telephone Encounter (Signed)
Patient called and says she has blood in her urine that started 2 weeks ago, went away and came back a week ago. She says it's her first urination in the morning there is red urine, then it goes away after 2-3 urination. She says there is burning, sharp sensation, but no pain with urination. She says she has lower back pain, but is unsure if it's arthritis pain she has or because of this. She says she doesn't take any medication for it because it's not bad enough. Denies fever or any other symptoms. I advised no availability with PCP, she asked if she could see Tally Joe. I called and spoke to Mickel Baas, [facility] who scheduled patient for today at 1300 with Tally Joe, FNP. Patient advised of the appointment and agrees, care advice given, patient verbalized understanding.   Reason for Disposition  Taking Coumadin (warfarin) or other strong blood thinner, or known bleeding disorder (e.g., thrombocytopenia)  Answer Assessment - Initial Assessment Questions 1. COLOR of URINE: "Describe the color of the urine."  (e.g., tea-colored, pink, red, blood clots, bloody)     Red 2. ONSET: "When did the bleeding start?"      2 weeks ago, started and stopped, started back at least a week 3. EPISODES: "How many times has there been blood in the urine?" or "How many times today?"     Probably 3 times 4. PAIN with URINATION: "Is there any pain with passing your urine?" If Yes, ask: "How bad is the pain?"  (Scale 1-10; or mild, moderate, severe)    - MILD - complains slightly about urination hurting    - MODERATE - interferes with normal activities      - SEVERE - excruciating, unwilling or unable to urinate because of the pain      Burning pain, mild 5. FEVER: "Do you have a fever?" If Yes, ask: "What is your temperature, how was it measured, and when did it start?"     No 6. ASSOCIATED SYMPTOMS: "Are you passing urine more frequently than usual?"     No 7. OTHER SYMPTOMS: "Do you have any other symptoms?" (e.g.,  back/flank pain, abdominal pain, vomiting)     No 8. PREGNANCY: "Is there any chance you are pregnant?" "When was your last menstrual period?"     No  Protocols used: Urine - Blood In-A-AH

## 2020-11-11 NOTE — Assessment & Plan Note (Signed)
Pain R CVA + Report of variant pain from L front flank to mid umbilical; now without pain

## 2020-11-11 NOTE — Assessment & Plan Note (Signed)
Patient report Worse in am No known kidney stones or bladder stones

## 2020-11-11 NOTE — Telephone Encounter (Signed)
FYI your 1PM patient today. KW

## 2020-11-11 NOTE — Assessment & Plan Note (Signed)
Irritated skin under abdominal pannus

## 2020-11-11 NOTE — Progress Notes (Signed)
Established patient visit   Patient: Glenda Williams   DOB: Jun 12, 1940   80 y.o. Female  MRN: [id number] Visit Date: 11/11/2020  Today's healthcare provider: [name], FNP   Chief Complaint  Patient presents with   Hematuria   Rash   Subjective    Hematuria This is a recurrent problem. The current episode started 1 to 4 weeks ago. The problem is unchanged. Urine color: bright red. Associated symptoms include bladder pain, dysuria and fever. Pertinent negatives include no abdominal pain, bone pain, chills, facial swelling, flank pain, genital pain, hematospermia, hesitancy, inability to urinate, nausea, urinary retention, vomiting or weight loss.  Rash This is a recurrent problem. The problem has been gradually improving since onset. The affected locations include the abdomen (folds of skin). The rash is characterized by burning and redness. Associated symptoms include a fever. Pertinent negatives include no vomiting. Treatments tried: [place].      Medications: Outpatient Medications Prior to Visit  Medication Sig   cetirizine (ZYRTEC) 10 MG tablet Take 10 mg by mouth daily as needed.    cholecalciferol (VITAMIN D) 400 units TABS tablet Take 400 Units by mouth daily.   EPINEPHrine 0.3 mg/0.3 mL IJ SOAJ injection Inject 0.3 mLs into the skin once as needed for anaphylaxis.   folic acid (FOLVITE) 1 MG tablet Take 1 mg by mouth daily.   hydrochlorothiazide (HYDRODIURIL) 12.5 MG tablet TAKE 1 TABLET BY MOUTH  DAILY   hydrocortisone cream 1 % Apply 1 application topically as needed for itching.   levothyroxine (SYNTHROID) 100 MCG tablet TAKE 1 TABLET BY MOUTH  DAILY   methotrexate (RHEUMATREX) 2.5 MG tablet Take 20 mg by mouth every Thursday.    Omega-3 Fatty Acids (FISH OIL) 1000 MG CAPS Take 1 capsule by mouth daily.   omeprazole (PRILOSEC) 20 MG capsule TAKE 1 CAPSULE BY MOUTH  TWICE DAILY BEFORE MEALS   potassium chloride SA (K-DUR,KLOR-CON) 20 MEQ tablet Take 1 tablet by  mouth  daily   propranolol (INDERAL) 40 MG tablet 80 mg in the am and 40 mg at night   Red Yeast Rice 600 MG CAPS Take 1 capsule by mouth daily.   rivaroxaban (XARELTO) 20 MG TABS tablet TAKE 1 TABLET BY MOUTH ONCE DAILY   sertraline (ZOLOFT) 50 MG tablet TAKE 3 TABLETS BY MOUTH  DAILY   No facility-administered medications prior to visit.    Review of Systems  Constitutional:  Positive for fever. Negative for chills and weight loss.  HENT:  Negative for facial swelling.   Gastrointestinal:  Negative for abdominal pain, nausea and vomiting.  Genitourinary:  Positive for dysuria and hematuria. Negative for flank pain and hesitancy.  Skin:  Positive for rash.      Objective    BP (!) 135/53   Pulse 62   Temp 98.5 F (36.9 C) (Oral)   Resp 16   Wt 219 lb (99.3 kg)   BMI 35.35 kg/m  {Show previous vital signs (optional):23777}  Physical Exam Vitals and nursing note reviewed.  Constitutional:      General: She is not in acute distress.    Appearance: Normal appearance. She is obese. She is not ill-appearing, toxic-appearing or diaphoretic.  HENT:     Head: Normocephalic and atraumatic.  Cardiovascular:     Rate and Rhythm: Normal rate and regular rhythm.     Pulses: Normal pulses.     Heart sounds: Normal heart sounds. No murmur heard.   No friction  rub. No gallop.  Pulmonary:     Effort: Pulmonary effort is normal. No respiratory distress.     Breath sounds: Normal breath sounds. No stridor. No wheezing, rhonchi or rales.  Chest:     Chest wall: No tenderness.  Abdominal:     General: Bowel sounds are normal.     Palpations: Abdomen is soft.  Musculoskeletal:        General: No swelling, tenderness, deformity or signs of injury. Normal range of motion.     Right lower leg: No edema.     Left lower leg: No edema.  Skin:    General: Skin is warm and dry.     Capillary Refill: Capillary refill takes less than 2 seconds.     Coloration: Skin is not jaundiced or pale.      Findings: No bruising, erythema, lesion or rash.  Neurological:     General: No focal deficit present.     Mental Status: She is alert and oriented to person, place, and time. Mental status is at baseline.     Cranial Nerves: No cranial nerve deficit.     Sensory: No sensory deficit.     Motor: No weakness.     Coordination: Coordination normal.  Psychiatric:        Mood and Affect: Mood normal.        Behavior: Behavior normal.        Thought Content: Thought content normal.        Judgment: Judgment normal.     Assessment & Plan       Problem List Items Addressed This Visit       Musculoskeletal and Integument   Yeast infection of the skin    Large area under abdominal pannus Clears up and then comes back Roughly 15 cm x 8 cm Pink shiny skin No discharge No breakdown No ordor      Relevant Medications   nystatin (MYCOSTATIN/NYSTOP) powder   ketoconazole (NIZORAL) 2 % cream   Yeast dermatitis    Irritated skin under abdominal pannus      Relevant Medications   nystatin (MYCOSTATIN/NYSTOP) powder   ketoconazole (NIZORAL) 2 % cream     Genitourinary   Acute cystitis with hematuria    Some relief since previous UTI tx; dip + today Plan for Ucx as well as micro Push fluids Referral to urology given repeat occurrence and associated symptoms      Relevant Medications   levofloxacin (LEVAQUIN) 500 MG tablet   Other Relevant Orders   Ambulatory referral to Urology     Other   Hematuria - Primary    Patient report Worse in am No known kidney stones or bladder stones      Relevant Orders   Urine Microscopic   POCT urinalysis dipstick (Completed)   Urine Culture   Ambulatory referral to Urology   Bilateral flank pain    Pain R CVA + Report of variant pain from L front flank to mid umbilical; now without pain      Relevant Orders   Ambulatory referral to Urology   Abdominal pannus    Grade 2-3; continue to monitor        Results for orders  placed or performed in visit on 11/11/20  POCT urinalysis dipstick  Result Value Ref Range   Color, UA dark orange    Clarity, UA cloudy    Glucose, UA Negative Negative   Bilirubin, UA small    Ketones, UA negative  Spec Grav, UA 1.015 1.010 - 1.025   Blood, UA large    pH, UA 6.5 5.0 - 8.0   Protein, UA Positive (A) Negative   Urobilinogen, UA >=8.0 (A) 0.2 or 1.0 E.U./dL   Nitrite, UA positive    Leukocytes, UA Large (3+) (A) Negative   Appearance     Odor       Return if symptoms worsen or fail to improve.      Vonna Kotyk, FNP, have reviewed all documentation for this visit. The documentation on 11/11/20 for the exam, diagnosis, procedures, and orders are all accurate and complete.    [name], [place] [phone] (phone) [phone] (fax)  [place]

## 2020-11-12 LAB — URINALYSIS, MICROSCOPIC ONLY: WBC, UA: >30 /hpf — AB (ref 0–5)

## 2020-11-15 ENCOUNTER — Other Ambulatory Visit: Payer: Self-pay | Admitting: Family Medicine

## 2020-11-15 DIAGNOSIS — N3001 Acute cystitis with hematuria: Secondary | ICD-10-CM

## 2020-11-15 DIAGNOSIS — L299 Pruritus, unspecified: Secondary | ICD-10-CM

## 2020-11-15 LAB — URINE CULTURE

## 2020-11-15 LAB — SPECIMEN STATUS REPORT

## 2020-11-15 MED ORDER — HYDROXYZINE HCL 10 MG PO TABS: 10.0000 mg | ORAL_TABLET | Freq: Three times a day (TID) | ORAL | 0 refills | Status: DC | PRN

## 2020-11-15 MED ORDER — CEFUROXIME AXETIL 250 MG PO TABS: 250.0000 mg | ORAL_TABLET | Freq: Two times a day (BID) | ORAL | 0 refills | Status: AC

## 2020-11-16 ENCOUNTER — Ambulatory Visit: Payer: Self-pay

## 2020-11-16 ENCOUNTER — Telehealth: Payer: Self-pay

## 2020-11-16 NOTE — Telephone Encounter (Signed)
Called pt on ome and alternate number and VM not set up.

## 2020-11-16 NOTE — Telephone Encounter (Signed)
Patient advised to D/C Levaquin and start Ceftin for UTI. Patient also advised that hydroxizine was sent to use PRN for possible itching and or rash.

## 2020-11-16 NOTE — Telephone Encounter (Signed)
Called both home and alternate numbers and VM not set up. Routing to office.

## 2020-11-18 ENCOUNTER — Ambulatory Visit: Payer: Self-pay | Admitting: *Deleted

## 2020-11-18 NOTE — Telephone Encounter (Signed)
2 attempts to reach pt, VM not set up. Called husbands number listed, VM not set up.   Per agent: Summary: Clinical Advice   Patient was advised by the pharmacist to contact PCP due to cefUROXime (CEFTIN) 250 MG tablet having penicillin in it and My Chart reflects she is allergic.  Patient seeking clinical advice      Please advise. Thank you

## 2020-11-18 NOTE — Telephone Encounter (Signed)
Sent to practice

## 2020-11-19 ENCOUNTER — Telehealth: Payer: Self-pay

## 2020-11-19 NOTE — Telephone Encounter (Signed)
Patient advised that provider is aware of allergy list. Patient advised of providers recommendations to take ceftin and hydroxyzine to prevent any rashes. I will contact pharmacy also. Patient will start medication today.

## 2020-11-19 NOTE — Telephone Encounter (Signed)
  Per agent:    Summary: Clinical Advice    Patient was advised by the pharmacist to contact PCP due to cefUROXime (CEFTIN) 250 MG tablet having penicillin in it and My Chart reflects she is allergic.  Patient seeking clinical advice       Please advise. Thank you  Sha sent her a message; the PO abx are limited given her allergies and the susceptibility of the bacteria.  There are IV choices, but not PO.  We can refer her to urology who may have a work around that I am unaware of.  However, that is why I wrote for the atarax to help with itching risk.   Patient advised. She will start medications. Please call pharmacy.

## 2020-11-29 ENCOUNTER — Encounter: Payer: Self-pay | Admitting: Family Medicine

## 2020-11-29 ENCOUNTER — Ambulatory Visit (INDEPENDENT_AMBULATORY_CARE_PROVIDER_SITE_OTHER): Payer: Medicare Other | Admitting: Family Medicine

## 2020-11-29 ENCOUNTER — Other Ambulatory Visit: Payer: Self-pay

## 2020-11-29 VITALS — BP 125/75 | HR 67 | Temp 98.0°F | Resp 16 | Ht 65.0 in | Wt 244.8 lb

## 2020-11-29 DIAGNOSIS — E039 Hypothyroidism, unspecified: Secondary | ICD-10-CM

## 2020-11-29 DIAGNOSIS — Z6841 Body Mass Index (BMI) 40.0 and over, adult: Secondary | ICD-10-CM | POA: Diagnosis not present

## 2020-11-29 DIAGNOSIS — F419 Anxiety disorder, unspecified: Secondary | ICD-10-CM | POA: Diagnosis not present

## 2020-11-29 DIAGNOSIS — I10 Essential (primary) hypertension: Secondary | ICD-10-CM

## 2020-11-29 DIAGNOSIS — I480 Paroxysmal atrial fibrillation: Secondary | ICD-10-CM | POA: Diagnosis not present

## 2020-11-29 DIAGNOSIS — G250 Essential tremor: Secondary | ICD-10-CM

## 2020-11-29 DIAGNOSIS — Z0000 Encounter for general adult medical examination without abnormal findings: Secondary | ICD-10-CM

## 2020-11-29 DIAGNOSIS — M069 Rheumatoid arthritis, unspecified: Secondary | ICD-10-CM

## 2020-11-29 DIAGNOSIS — R7303 Prediabetes: Secondary | ICD-10-CM

## 2020-11-29 DIAGNOSIS — E782 Mixed hyperlipidemia: Secondary | ICD-10-CM

## 2020-11-29 NOTE — Assessment & Plan Note (Signed)
-   Chronic, with recent episodes of orthostatic hypotension - Discontinue HCTZ - Recheck CMP

## 2020-11-29 NOTE — Progress Notes (Signed)
Annual Wellness Visit     Patient: Glenda Williams, Female    DOB: 1940/06/17, 80 y.o.   MRN: [id number] Visit Date: 11/29/2020  Today's Provider: Lavon Paganini, MD   Chief Complaint  Patient presents with   Medicare Wellness   Subjective    Glenda Williams is a 80 y.o. female who presents today for her Annual Wellness Visit. She reports consuming a general diet. The patient does not participate in regular exercise at present. She generally feels fairly well. She reports sleeping well. She does have additional problems to discuss today.   HPI Hypertension - Currently taking HCTZ - Pt. reports that BP at home was 120/60 this morning - She endorses occasional lightheadedness, worse upon wakening - Denies chest pain & SOB; endorses chronic bil LE edema  Hypothyroidism - Currently taking Synthroid - Denies cold intolerance, constipation, & fatigue  Rheumatoid Arthritis - Currently taking Methotrexate; received Golimumab infusion this morning - Followed by rheumatology - Reports continued daily joint pains  Atrial Fibrillation - Currently taking Xarelto & Propranolol - Denies recent palpitations, chest pain, & SOB  Essential Tremor - Reports adequate control with Propranolol  History of Recent UTI - Was prescribed Cephalexin x 7 days with last dose taken yesterday - Denies recurrent dysuria, fevers, suprapubic pain, or flank tenderness  Appetite Loss - Pt. reports increased appetite loss since starting Golimumab infusion  Anxiety - Reports adequate control with Zoloft  Health Maintenance - Due for COVID booster, Shingles, & influenza vaccines  Medications: Outpatient Medications Prior to Visit  Medication Sig   cetirizine (ZYRTEC) 10 MG tablet Take 10 mg by mouth daily as needed.    cholecalciferol (VITAMIN D) 400 units TABS tablet Take 400 Units by mouth daily.   EPINEPHrine 0.3 mg/0.3 mL IJ SOAJ injection Inject 0.3 mLs into the skin once as needed for  anaphylaxis.   folic acid (FOLVITE) 1 MG tablet Take 1 mg by mouth daily.   hydrochlorothiazide (HYDRODIURIL) 12.5 MG tablet TAKE 1 TABLET BY MOUTH  DAILY   hydrocortisone cream 1 % Apply 1 application topically as needed for itching.   hydrOXYzine (ATARAX/VISTARIL) 10 MG tablet Take 1 tablet (10 mg total) by mouth 3 (three) times daily as needed.   ketoconazole (NIZORAL) 2 % cream Apply 1 application topically daily.   levothyroxine (SYNTHROID) 100 MCG tablet TAKE 1 TABLET BY MOUTH  DAILY   methotrexate (RHEUMATREX) 2.5 MG tablet Take 20 mg by mouth every Thursday.    nystatin (MYCOSTATIN/NYSTOP) powder Apply 1 application topically 3 (three) times daily. Apply to skin folds   Omega-3 Fatty Acids (FISH OIL) 1000 MG CAPS Take 1 capsule by mouth daily.   omeprazole (PRILOSEC) 20 MG capsule TAKE 1 CAPSULE BY MOUTH  TWICE DAILY BEFORE MEALS   potassium chloride SA (K-DUR,KLOR-CON) 20 MEQ tablet Take 1 tablet by mouth  daily   propranolol (INDERAL) 40 MG tablet 80 mg in the am and 40 mg at night   Red Yeast Rice 600 MG CAPS Take 1 capsule by mouth daily.   rivaroxaban (XARELTO) 20 MG TABS tablet TAKE 1 TABLET BY MOUTH ONCE DAILY   sertraline (ZOLOFT) 50 MG tablet TAKE 3 TABLETS BY MOUTH  DAILY   No facility-administered medications prior to visit.    Allergies  Allergen Reactions   Bactrim [Sulfamethoxazole-Trimethoprim] Rash   Macrobid [Nitrofurantoin Macrocrystal] Rash   Mirabegron Rash   Oxybutynin Rash   Penicillins Rash    Has patient had a PCN reaction  causing immediate rash, facial/tongue/throat swelling, SOB or lightheadedness with hypotension: Yes Has patient had a PCN reaction causing severe rash involving mucus membranes or skin necrosis: No Has patient had a PCN reaction that required hospitalization: No Has patient had a PCN reaction occurring within the last 10 years: Unknown If all of the above answers are "NO", then may proceed with Cephalosporin use.     Patient Care  Team: Virginia Crews, MD as PCP - General (Family Medicine) Bary Castilla Forest Gleason, MD (General Surgery) Ubaldo Glassing Javier Docker, MD as Consulting Physician (Cardiology) Emmaline Kluver., MD as Consulting Physician (Rheumatology) Vladimir Crofts, MD as Consulting Physician (Neurology) Hollice Espy, MD as Consulting Physician (Urology) Dingeldein, Remo Lipps, MD (Ophthalmology) Sunday Corn Mervin Kung (Neurology)  Review of Systems  Constitutional:  Positive for appetite change. Negative for activity change, fatigue and fever.  HENT:  Positive for hearing loss.   Eyes: Negative.   Respiratory:  Negative for chest tightness and shortness of breath.   Cardiovascular:  Positive for leg swelling. Negative for chest pain and palpitations.  Gastrointestinal: Negative.   Endocrine: Negative.   Genitourinary: Negative.  Negative for dysuria.  Musculoskeletal:  Positive for arthralgias.  Skin: Negative.         Objective    Vitals: BP 125/75 (BP Location: Left Arm, Patient Position: Sitting, Cuff Size: Large)   Pulse 67   Temp 98 F (36.7 C) (Oral)   Resp 16   Ht 5\' 5"  (1.651 m)   Wt 244 lb 12.8 oz (111 kg)   BMI 40.74 kg/m     Physical Exam Constitutional:      Appearance: Normal appearance. She is obese.  HENT:     Head: Normocephalic and atraumatic.     Right Ear: External ear normal.     Left Ear: External ear normal.  Eyes:     Conjunctiva/sclera: Conjunctivae normal.  Cardiovascular:     Rate and Rhythm: Normal rate and regular rhythm.     Pulses: Normal pulses.     Heart sounds: Normal heart sounds.  Pulmonary:     Effort: Pulmonary effort is normal.     Breath sounds: Normal breath sounds.  Abdominal:     General: Bowel sounds are normal.     Palpations: Abdomen is soft.     Tenderness: There is no right CVA tenderness or left CVA tenderness.  Musculoskeletal:     Right lower leg: Edema present.     Left lower leg: Edema present.  Skin:    General: Skin is  warm and dry.  Neurological:     Mental Status: She is alert.  Psychiatric:        Behavior: Behavior normal.        Thought Content: Thought content normal.    Most recent functional status assessment: In your present state of health, do you have any difficulty performing the following activities: 11/29/2020  Hearing? Y  Vision? N  Difficulty concentrating or making decisions? N  Walking or climbing stairs? N  Dressing or bathing? N  Doing errands, shopping? N  Some recent data might be hidden   Most recent fall risk assessment: Fall Risk  11/29/2020  Falls in the past year? 0  Comment -  Number falls in past yr: 0  Comment -  Injury with Fall? 0  Risk for fall due to : No Fall Risks  Follow up Falls evaluation completed    Most recent depression screenings: PHQ 2/9 Scores 11/29/2020 10/07/2020  PHQ - 2 Score 0 0  PHQ- 9 Score 4 1  Exception Documentation - -   Most recent cognitive screening: 6CIT Screen 11/29/2020  What Year? 0 points  What month? 0 points  What time? 0 points  Count back from 20 0 points  Months in reverse 0 points  Repeat phrase 0 points  Total Score 0   Most recent Audit-C alcohol use screening Alcohol Use Disorder Test (AUDIT) 11/29/2020  1. How often do you have a drink containing alcohol? 0  2. How many drinks containing alcohol do you have on a typical day when you are drinking? 0  3. How often do you have six or more drinks on one occasion? 0  AUDIT-C Score 0  Alcohol Brief Interventions/Follow-up -   A score of 3 or more in women, and 4 or more in men indicates increased risk for alcohol abuse, EXCEPT if all of the points are from question 1   No results found for any visits on 11/29/20.  Assessment & Plan     Annual wellness visit done today including the all of the following: Reviewed patient's Family Medical History Reviewed and updated list of patient's medical providers Assessment of cognitive impairment was done Assessed  patient's functional ability Established a written schedule for health screening [place] Completed and Reviewed  Exercise Activities and Dietary recommendations  Goals       DIET - REDUCE CARBOHYDRATE INTAKE      Recommend cutting carbohydrates in half and cutting sugars out completely.       Exercise 3x per week (30 min per time)      Recommend to start walking 3 days a week for at least 30 minutes at a time.      I want to remain as healthy as I can (pt-stated)      Current Barriers:  Knowledge Deficits related to maintaining a healthy lifestyle to decrease risk of developing additional chronic disease states  Nurse Case Manager Clinical Goal(s):  Over the next 60 days, patient will demonstrate understanding of plan for health management/maintenance as evidenced by taking all medications as prescribed, checking BP and/or CBG as directed and record, daily exercise as tolerated, adherence to prescribe diet/healthy eating plan, and adherence to all medical appointments.  Interventions:  Assessed for medication adherence Assessed for increased activity and exercise Discussed covid 19 infection prevention precautions Discussed plans with patient for ongoing care management follow up and provided patient with direct contact information for care management team Reviewed BP and CBG log   Sent previous educational materials via mail as patient states she did not receive previously sent information. Encouraged patient to request flu vaccine and pneumonia vaccine if needed during PCP visit 11/07/2018   Patient Self Care Activities:  Self administers medications as prescribed Attends all scheduled provider appointments Calls pharmacy for medication refills Attends church or other social activities Performs ADL's independently Performs IADL's independently Calls provider office for new concerns or questions  Please see past updates related to this goal by clicking on  the "Past Updates" button in the selected goal          Immunization History  Administered Date(s) Administered   Fluad Quad(high Dose 65+) 12/10/2018, 11/18/2019   Influenza, High Dose Seasonal PF 12/11/2014, 12/13/2016, 12/27/2017   Influenza-Unspecified 11/03/2015   Moderna Sars-Covid-2 Vaccination 03/19/2019, 04/16/2019   Pneumococcal Conjugate-13 02/23/2014   Pneumococcal Polysaccharide-23 07/05/2017   Td 04/21/1999   Tdap 04/21/1999    Health  Maintenance  Topic Date Due   Zoster Vaccines- Shingrix (1 of 2) Never done   COVID-19 Vaccine (3 - Moderna risk series) 05/14/2019   INFLUENZA VACCINE  10/04/2020   TETANUS/TDAP  01/25/2023 (Originally 04/20/2009)   DEXA SCAN  Completed   HPV VACCINES  Aged Out     Discussed health benefits of physical activity, and encouraged her to engage in regular exercise appropriate for her age and condition.    Problem List Items Addressed This Visit       Cardiovascular and Mediastinum   Atrial fibrillation ([place])    - Chronic and well-controlled - Continue Xarelto & Propranolol      Hypertension    - Chronic, with recent episodes of orthostatic hypotension - Discontinue HCTZ - Recheck CMP      Relevant Orders   Comprehensive metabolic panel     Endocrine   Adult hypothyroidism    - Chronic, previously stable - Continue Synthroid - Recheck TSH      Relevant Orders   TSH     Nervous and Auditory   Benign essential tremor    - Chronic and well-controlled - Continue Propranolol        Musculoskeletal and Integument   Rheumatoid arthritis (HCC)    - Chronic, with recent exacerbations of joint pain - Continue Methotrexate; had Golimumab infusion this morning - Continue following with rheumatology        Other   Anxiety    - Chronic and well-controlled - Continue Zoloft      Prediabetes    - Chronic, previously well-controlled - Recheck A1c      Relevant Orders   Hemoglobin A1c   Morbid obesity (HCC)     - Chronic and stable - Discussed diet and exercise - Discussed importance of healthy weight management      Other Visit Diagnoses     Encounter for Medicare annual wellness exam    -  Primary   - Overall doing well - Return to clinic in 4 months for follow-up on chronic conditions    BMI 40.0-44.9, adult ([place])       - Chronic, stable - Discussed diet and exercise - Discussed importance of healthy weight management    Mixed hyperlipidemia        - Chronic, previously stable  - Recheck lipid panel    Relevant Orders   Comprehensive metabolic panel   Lipid panel        Return in about 4 months (around 03/31/2021) for chronic disease f/u.       Percell Locus, MS3   Patient seen along with MS3 student Percell Locus. I personally evaluated this patient along with the student, and verified all aspects of the history, physical exam, and medical decision making as documented by the student. I agree with the student's documentation and have made all necessary edits.  Claborn Janusz, Dionne Bucy, MD, MPH [facility]

## 2020-11-29 NOTE — Assessment & Plan Note (Signed)
-   Chronic and well-controlled - Continue Zoloft

## 2020-11-29 NOTE — Assessment & Plan Note (Signed)
-   Chronic, previously stable - Continue Synthroid - Recheck TSH

## 2020-11-29 NOTE — Assessment & Plan Note (Signed)
-   Chronic, with recent exacerbations of joint pain - Continue Methotrexate; had Golimumab infusion this morning - Continue following with rheumatology

## 2020-11-29 NOTE — Assessment & Plan Note (Signed)
-   Chronic and well-controlled - Continue Xarelto & Propranolol

## 2020-11-29 NOTE — Patient Instructions (Addendum)
Stop Hydrochlorothiazide (fluid pill) for low blood pressure  Check with Dr Jefm Bryant about new shingles shot (Shingrix)

## 2020-11-29 NOTE — Assessment & Plan Note (Signed)
-   Chronic and stable - Discussed diet and exercise - Discussed importance of healthy weight management

## 2020-11-29 NOTE — Assessment & Plan Note (Signed)
-   Chronic, previously well-controlled - Recheck A1c

## 2020-11-29 NOTE — Assessment & Plan Note (Signed)
-   Chronic and well-controlled - Continue Propranolol

## 2020-12-07 DIAGNOSIS — E039 Hypothyroidism, unspecified: Secondary | ICD-10-CM | POA: Diagnosis not present

## 2020-12-07 DIAGNOSIS — R7303 Prediabetes: Secondary | ICD-10-CM | POA: Diagnosis not present

## 2020-12-07 DIAGNOSIS — I10 Essential (primary) hypertension: Secondary | ICD-10-CM | POA: Diagnosis not present

## 2020-12-07 DIAGNOSIS — E782 Mixed hyperlipidemia: Secondary | ICD-10-CM | POA: Diagnosis not present

## 2020-12-08 LAB — COMPREHENSIVE METABOLIC PANEL
ALT: 10 IU/L (ref 0–32)
AST: 19 IU/L (ref 0–40)
Albumin/Globulin Ratio: 1.2 (ref 1.2–2.2)
Albumin: 3.7 g/dL (ref 3.7–4.7)
Alkaline Phosphatase: 79 IU/L (ref 44–121)
BUN/Creatinine Ratio: 17 (ref 12–28)
BUN: 12 mg/dL (ref 8–27)
Bilirubin Total: 0.7 mg/dL (ref 0.0–1.2)
CO2: 24 mmol/L (ref 20–29)
Calcium: 9.4 mg/dL (ref 8.7–10.3)
Chloride: 102 mmol/L (ref 96–106)
Creatinine, Ser: 0.72 mg/dL (ref 0.57–1.00)
Globulin, Total: 3.2 g/dL (ref 1.5–4.5)
Glucose: 87 mg/dL (ref 70–99)
Potassium: 4.3 mmol/L (ref 3.5–5.2)
Sodium: 139 mmol/L (ref 134–144)
Total Protein: 6.9 g/dL (ref 6.0–8.5)
eGFR: 85 mL/min/{1.73_m2} (ref >59)

## 2020-12-08 LAB — LIPID PANEL
Chol/HDL Ratio: 4 ratio (ref 0.0–4.4)
Cholesterol, Total: 153 mg/dL (ref 100–199)
HDL: 38 mg/dL — ABNORMAL LOW (ref >39)
LDL Chol Calc (NIH): 98 mg/dL (ref 0–99)
Triglycerides: 88 mg/dL (ref 0–149)
VLDL Cholesterol Cal: 17 mg/dL (ref 5–40)

## 2020-12-08 LAB — HEMOGLOBIN A1C
Est. average glucose Bld gHb Est-mCnc: 120 mg/dL
Hgb A1c MFr Bld: 5.8 % — ABNORMAL HIGH (ref 4.8–5.6)

## 2020-12-08 LAB — TSH: TSH: 2.85 u[IU]/mL (ref 0.450–4.500)

## 2020-12-20 ENCOUNTER — Ambulatory Visit: Payer: Self-pay

## 2020-12-20 ENCOUNTER — Telehealth: Payer: Self-pay

## 2020-12-20 ENCOUNTER — Ambulatory Visit: Payer: Self-pay | Admitting: *Deleted

## 2020-12-20 DIAGNOSIS — N3001 Acute cystitis with hematuria: Secondary | ICD-10-CM

## 2020-12-20 NOTE — Addendum Note (Signed)
Addended by: Shawna Orleans on: 12/20/2020 02:49 PM   Modules accepted: Orders

## 2020-12-20 NOTE — Telephone Encounter (Signed)
Pt called back in trying to return a call to another triage nurse.   The call dropped.   No one was on the line. I called her back but her voicemail box has not been set up yet so unable to leave a message.

## 2020-12-20 NOTE — Telephone Encounter (Signed)
Copied from [facility] [id number]. Topic: Appointment Scheduling - Scheduling Inquiry for Clinic >> Dec 20, 2020  9:27 AM Glenda Williams wrote: Reason for CRM: Pt called and has blood in her Urine again/ she would like an appt to come in asap / please advise

## 2020-12-20 NOTE — Telephone Encounter (Signed)
Patient advised. Referral placed. Appt for 12/21/20 canceled.

## 2020-12-20 NOTE — Telephone Encounter (Signed)
Pt. Reports she has blood in her urine since "September. I've been on 2 antibiotics and I think I need another."  Not seeing blood with every void, but has it mainly in the mornings. No other symptoms. Wants to know from Dr. Brita Romp want needs to be done. Declines appointment , "unless she wants to see me." Please advise pt. Answer Assessment - Initial Assessment Questions 1. COLOR of URINE: "Describe the color of the urine."  (e.g., tea-colored, pink, red, blood clots, bloody)     Red 2. ONSET: "When did the bleeding start?"      September 3. EPISODES: "How many times has there been blood in the urine?" or "How many times today?"     Not every time 4. PAIN with URINATION: "Is there any pain with passing your urine?" If Yes, ask: "How bad is the pain?"  (Scale 1-10; or mild, moderate, severe)    - MILD - complains slightly about urination hurting    - MODERATE - interferes with normal activities      - SEVERE - excruciating, unwilling or unable to urinate because of the pain      No 5. FEVER: "Do you have a fever?" If Yes, ask: "What is your temperature, how was it measured, and when did it start?"     No 6. ASSOCIATED SYMPTOMS: "Are you passing urine more frequently than usual?"     No 7. OTHER SYMPTOMS: "Do you have any other symptoms?" (e.g., back/flank pain, abdominal pain, vomiting)     No 8. PREGNANCY: "Is there any chance you are pregnant?" "When was your last menstrual period?"     No  Protocols used: Urine - Blood In-A-AH

## 2020-12-20 NOTE — Telephone Encounter (Signed)
[facility] agent tried to connect pt. To triage. Call dropped. Called pt. Back and voice mailbox has not been set up. Unable to leave message.

## 2020-12-20 NOTE — Telephone Encounter (Signed)
Apt  12/21/2020 at 1pm  Thanks,   -Skyley Grandmaison

## 2020-12-20 NOTE — Telephone Encounter (Signed)
If still having hematuria after 2 rounds of abx for UTI, needs urology evaluation.  Please place referral to [facility] and let patient know. Thanks!

## 2020-12-21 ENCOUNTER — Ambulatory Visit: Payer: Medicare Other | Admitting: Physician Assistant

## 2020-12-27 NOTE — Progress Notes (Signed)
12/28/2020 5:10 PM   Marzell Julien Nordmann 10/27/1940 [id number]  Referring provider: Virginia Crews, [address] [place],  [place] [address]  Chief Complaint  Patient presents with   Recurrent UTI    Urological history: 1. Urge incontinence -contributing factors of age, vaginal atrophy, obesity, vaginal delivery, a family history of incontinence, caffeine, depression, antihistamines, decongestants, ACE inhibitors, antiarrhythmics, diuretics and antidepressants -PVR 50 mL -managed with incontinence pads   2. rUTI's -contributing factors of age, vaginal atrophy and constipation -documented positive urine culture over the last year  10/07/2020 Proteus mirabilis  11/11/2020 E.coli   HPI: ENRICA CORLISS is a 80 y.o. female with rUTI's and gross heme.  CATH UA nitrite positive, > 30 WBC's, > 30 RBC's and many bacteria  She states she has been having issues with recurrent urinary tract infections for the last 2 years.  She states that initially her urinary symptoms consisted of dysuria, stinging and itching in pain that would radiate up into the left side of her suprapubic area.  Now over the last 6 months, she has been experiencing gross hematuria with suprapubic pain with infections.    But yesterday, she had the onset of painless gross hematuria that lasted 2-3 voids.  She does not have a history of nephrolithiasis, GU surgery or GU trauma.   She is not sexually active.    She is postmenopausal.   She admits to constipation.  She does/does not engage in good perineal hygiene. She does not take tub baths.   She has incontinence.  She is using incontinence pads.   She is drinking a 8 glasses of water daily.  No sodas.  Unsweetened tea and coffee.   PMH: Past Medical History:  Diagnosis Date   A-fib [facility])    Arthritis    Bell palsy 09/04/2014   GERD (gastroesophageal reflux disease)    Heart disease    Rheumatoid arthritis (HCC)    Thyroid disease     Tremors of nervous system     Surgical History: Past Surgical History:  Procedure Laterality Date   BREAST BIOPSY Left 1988   Benign   BREAST BIOPSY Left 1987   Benign   BREAST EXCISIONAL BIOPSY     BROW LIFT Bilateral 05/23/2016   Procedure: BLEPHAROPLASTY upper eyelid; w/excess skin;  Surgeon: Karle Starch, MD;  Location: [place];  Service: Ophthalmology;  Laterality: Bilateral;   CARPAL TUNNEL RELEASE Bilateral    CHOLECYSTECTOMY     Dr. Bary Castilla   COLONOSCOPY  2006   JOINT REPLACEMENT Bilateral    knees   REPLACEMENT TOTAL KNEE Left 2005   REPLACEMENT TOTAL KNEE Right 1998    Home Medications:  Allergies as of 12/28/2020       Reactions   Bactrim [sulfamethoxazole-trimethoprim] Rash   Macrobid [nitrofurantoin Macrocrystal] Rash   Mirabegron Rash   Oxybutynin Rash   Penicillins Rash   Has patient had a PCN reaction causing immediate rash, facial/tongue/throat swelling, SOB or lightheadedness with hypotension: Yes Has patient had a PCN reaction causing severe rash involving mucus membranes or skin necrosis: No Has patient had a PCN reaction that required hospitalization: No Has patient had a PCN reaction occurring within the last 10 years: Unknown If all of the above answers are "NO", then may proceed with Cephalosporin use.        Medication List        Accurate as of December 28, 2020 11:59 PM. If you have any  questions, ask your nurse or doctor.          STOP taking these medications    hydrochlorothiazide 12.5 MG tablet Commonly known as: HYDRODIURIL Stopped by: Afnan Cadiente, PA-C       TAKE these medications    cefUROXime 500 MG tablet Commonly known as: CEFTIN Take 1 tablet (500 mg total) by mouth 2 (two) times daily with a meal. Started by: Nolberto Cheuvront, PA-C   cetirizine 10 MG tablet Commonly known as: ZYRTEC Take 10 mg by mouth daily as needed.   cholecalciferol 10 MCG (400 UNIT) Tabs tablet Commonly known as:  VITAMIN D3 Take 400 Units by mouth daily.   EPINEPHrine 0.3 mg/0.3 mL Soaj injection Commonly known as: EPI-PEN Inject 0.3 mLs into the skin once as needed for anaphylaxis.   Fish Oil 1000 MG Caps Take 1 capsule by mouth daily.   folic acid 1 MG tablet Commonly known as: FOLVITE Take 1 mg by mouth daily.   hydrocortisone cream 1 % Apply 1 application topically as needed for itching.   hydrOXYzine 10 MG tablet Commonly known as: ATARAX/VISTARIL Take 1 tablet (10 mg total) by mouth 3 (three) times daily as needed.   ketoconazole 2 % cream Commonly known as: NIZORAL Apply 1 application topically daily.   levothyroxine 100 MCG tablet Commonly known as: SYNTHROID TAKE 1 TABLET BY MOUTH  DAILY   methotrexate 2.5 MG tablet Commonly known as: RHEUMATREX Take 20 mg by mouth every Thursday.   nystatin powder Commonly known as: MYCOSTATIN/NYSTOP Apply 1 application topically 3 (three) times daily. Apply to skin folds   omeprazole 20 MG capsule Commonly known as: PRILOSEC TAKE 1 CAPSULE BY MOUTH  TWICE DAILY BEFORE MEALS   potassium chloride SA 20 MEQ tablet Commonly known as: KLOR-CON Take 1 tablet by mouth  daily   Premarin vaginal cream Generic drug: conjugated estrogens Apply 0.67m (pea-sized amount)  just inside the vaginal introitus with a finger-tip on  Monday, Wednesday and Friday nights. Started by: SZara Council PA-C   propranolol 40 MG tablet Commonly known as: INDERAL 80 mg in the am and 40 mg at night   Red Yeast Rice 600 MG Caps Take 1 capsule by mouth daily.   rivaroxaban 20 MG Tabs tablet Commonly known as: XARELTO TAKE 1 TABLET BY MOUTH ONCE DAILY   sertraline 50 MG tablet Commonly known as: ZOLOFT TAKE 3 TABLETS BY MOUTH  DAILY        Allergies:  Allergies  Allergen Reactions   Bactrim [Sulfamethoxazole-Trimethoprim] Anaphylaxis   Macrobid [Nitrofurantoin Macrocrystal] Rash   Mirabegron Rash   Oxybutynin Rash   Penicillins Rash     Has patient had a PCN reaction causing immediate rash, facial/tongue/throat swelling, SOB or lightheadedness with hypotension: Yes Has patient had a PCN reaction causing severe rash involving mucus membranes or skin necrosis: No Has patient had a PCN reaction that required hospitalization: No Has patient had a PCN reaction occurring within the last 10 years: Unknown If all of the above answers are "NO", then may proceed with Cephalosporin use.     Family History: Family History  Problem Relation Age of Onset   Stroke Mother    Hypertension Mother    Heart disease Father    Arthritis Father    Parkinson's disease Sister    Parkinson's disease Brother    Bladder Cancer Brother    Breast cancer Neg Hx    Kidney cancer Neg Hx     Social History:  reports  that she has never smoked. She has never used smokeless tobacco. She reports that she does not drink alcohol and does not use drugs.  ROS: Pertinent ROS in HPI  Physical Exam: BP 126/83   Pulse 69   Ht 5' 5"  (1.651 m)   Wt 243 lb 6.4 oz (110.4 kg)   BMI 40.50 kg/m   Constitutional:  Well nourished. Alert and oriented, No acute distress. HEENT: [place]anda AT, mask in place.  Trachea midline Cardiovascular: No clubbing, cyanosis, or edema. Respiratory: Normal respiratory effort, no increased work of breathing. GU: No CVA tenderness.  No bladder fullness or masses.  Atrophic external genitalia, normal pubic hair distribution, no lesions.  Normal urethral meatus, no lesions, no prolapse, no discharge.   No urethral masses, tenderness and/or tenderness. No bladder fullness, tenderness or masses. Pale vagina mucosa, poor estrogen effect, no discharge, no lesions, poor pelvic support, grade II cystocele and no rectocele noted.  Anus and perineum are without rashes or lesions.     Neurologic: Grossly intact, no focal deficits, moving all 4 extremities. Psychiatric: Normal mood and affect.    Laboratory Data: Lab Results  Component Value Date    WBC 9.4 05/17/2020   HGB 12.4 05/17/2020   HCT 40 05/17/2020   MCV 93 05/07/2019   PLT 293 05/17/2020    Lab Results  Component Value Date   CREATININE 0.72 12/07/2020    Lab Results  Component Value Date   HGBA1C 5.8 (H) 12/07/2020    Lab Results  Component Value Date   TSH 2.850 12/07/2020       Component Value Date/Time   CHOL 153 12/07/2020 0952   CHOL 143 02/13/2014 0356   HDL 38 (L) 12/07/2020 0952   HDL 31 (L) 02/13/2014 0356   CHOLHDL 4.0 12/07/2020 0952   VLDL 17 02/13/2014 0356   LDLCALC 98 12/07/2020 0952   LDLCALC 95 02/13/2014 0356    Lab Results  Component Value Date   AST 19 12/07/2020   Lab Results  Component Value Date   ALT 10 12/07/2020   Urinalysis Component     Latest Ref Rng & Units 12/28/2020  Specific Gravity, UA     1.005 - 1.030 1.025  pH, UA     5.0 - 7.5 5.5  Color, UA     Yellow Yellow  Appearance Ur     Clear Cloudy (A)  Leukocytes,UA     Negative 1+ (A)  Protein,UA     Negative/Trace 1+ (A)  Glucose, UA     Negative Negative  Ketones, UA     Negative Negative  RBC, UA     Negative 3+ (A)  Bilirubin, UA     Negative Negative  Urobilinogen, Ur     0.2 - 1.0 mg/dL 2.0 (H)  Nitrite, UA     Negative Positive (A)  Microscopic Examination      See below:   Component     Latest Ref Rng & Units 12/28/2020  WBC, UA     0 - 5 /hpf >30 (H)  RBC     0 - 2 /hpf >30 (H)  Epithelial Cells (non renal)     0 - 10 /hpf 0-10  Bacteria, UA     None seen/Few Many (A)  I have reviewed the labs.   Pertinent Imaging: N/A   In and Out Catheterization Patient is present today for a I & O catheterization due to rUTI's. Patient was cleaned and prepped in a sterile fashion  with betadine . A 14 FR cath was inserted no complications were noted , 50 ml of urine return was noted, urine was straw clear in color. A clean urine sample was collected for urinalysis and urine culture.  Bladder was drained  And catheter was removed  with out difficulty.    Performed by: Zara Council, PA-C and Kyra Manges, CMA  Assessment & Plan:    1. Gross hematuria - I explained to the patient that there are a number of causes that can be associated with blood in the urine, such as stones, UTI's, damage to the urinary tract and/or cancer. -at this time, they are in a high risk stratification for hematuria per AUA guidelines due to their age (>60 years) and gross heme  -recommended studies for high risk are CT urogram and cysto - I explained to the patient that a contrast material will be injected into a vein and that in rare instances, an allergic reaction can result and may even life threatening (1:100,000)  The patient denies any allergies to contrast, iodine and/or seafood and is not taking metformin.- Following the imaging study,  I've recommended a cystoscopy. I described how this is performed, typically in an office setting with a flexible cystoscope. We described the risks, benefits, and possible side effects, the most common of which is a minor amount of blood in the urine and/or burning which usually resolves in 24 to 48 hours.   - The patient had the opportunity to ask questions which were answered. Based upon this discussion, the patient is willing to proceed. Therefore, I've ordered: a CT Urogram and cystoscopy. - The patient will return following all of the above for discussion of the results.  - UA - Urine culture  2. rUTI's - criteria for recurrent UTI has been met with 2 or more infections in 6 months or 3 or greater infections in one year  - patient is instructed to increase their water intake until the urine is pale yellow or clear (10 to 12 cups daily)  - patient is instructed to take probiotics (yogurt, oral pills or vaginal suppositories), take cranberry pills or drink the juice and D-mannose - avoid soaking in tubs and wipe front to back after urinating  -CTU and cysto pending                                                 Return for CT Urogram report and cystoscopy for gross heme.  These notes generated with voice recognition software. I apologize for typographical errors.  Zara Council, PA-C  [facility] [address]  [place], [place] [address] [phone]

## 2020-12-28 ENCOUNTER — Ambulatory Visit (INDEPENDENT_AMBULATORY_CARE_PROVIDER_SITE_OTHER): Payer: Medicare Other | Admitting: Urology

## 2020-12-28 ENCOUNTER — Other Ambulatory Visit: Payer: Self-pay

## 2020-12-28 ENCOUNTER — Encounter: Payer: Self-pay | Admitting: Urology

## 2020-12-28 VITALS — BP 126/83 | HR 69 | Ht 65.0 in | Wt 243.4 lb

## 2020-12-28 DIAGNOSIS — N952 Postmenopausal atrophic vaginitis: Secondary | ICD-10-CM | POA: Diagnosis not present

## 2020-12-28 DIAGNOSIS — R310 Gross hematuria: Secondary | ICD-10-CM | POA: Diagnosis not present

## 2020-12-28 DIAGNOSIS — N390 Urinary tract infection, site not specified: Secondary | ICD-10-CM

## 2020-12-28 LAB — URINALYSIS, COMPLETE
Bilirubin, UA: NEGATIVE
Glucose, UA: NEGATIVE
Ketones, UA: NEGATIVE
Nitrite, UA: POSITIVE — AB
Specific Gravity, UA: 1.025 (ref 1.005–1.030)
Urobilinogen, Ur: 2 mg/dL — ABNORMAL HIGH (ref 0.2–1.0)
pH, UA: 5.5 (ref 5.0–7.5)

## 2020-12-28 LAB — MICROSCOPIC EXAMINATION
RBC, Urine: >30 /hpf — ABNORMAL HIGH (ref 0–2)
WBC, UA: >30 /hpf — ABNORMAL HIGH (ref 0–5)

## 2020-12-28 MED ORDER — PREMARIN 0.625 MG/GM VA CREA: TOPICAL_CREAM | VAGINAL | 12 refills | Status: AC

## 2020-12-28 NOTE — Patient Instructions (Signed)
  Apply 0.5mg  (pea-sized amount)  just inside the vaginal introitus with a finger-tip on Monday, Wednesday and Friday nights,

## 2020-12-31 ENCOUNTER — Telehealth: Payer: Self-pay | Admitting: Family Medicine

## 2020-12-31 LAB — CULTURE, URINE COMPREHENSIVE

## 2020-12-31 MED ORDER — CEFUROXIME AXETIL 500 MG PO TABS: 500.0000 mg | ORAL_TABLET | Freq: Two times a day (BID) | ORAL | 0 refills | Status: DC

## 2020-12-31 NOTE — Telephone Encounter (Signed)
Spoke to patient and she states she remembers only having a rash on PCN and she had Anaphylaxis with Sufa medication. I informed her to take the Ceftin and she starts to get a rash don't take anymore and take benadryl.  Patient voiced understanding. I did tell her if you wanted to change the medication I would let her know.

## 2020-12-31 NOTE — Telephone Encounter (Signed)
-----   Message from Nori Riis, PA-C sent at 12/31/2020 12:00 PM EDT ----- Please ask Mrs. Depaoli if she is able to take the Ceftin 500 mg twice daily for seven days for her positive urine culture.  We also need to ask her if she has had SOB, hypotension, immediate rash, facial, tongue or throat swelling with PCN in the past.  Her allergy list for PCN does not make sense as the severity is listed as low, but the comments note a possible anaphylactic reaction.

## 2021-01-01 DIAGNOSIS — Z23 Encounter for immunization: Secondary | ICD-10-CM | POA: Diagnosis not present

## 2021-01-11 ENCOUNTER — Telehealth: Payer: Self-pay | Admitting: Family Medicine

## 2021-01-11 DIAGNOSIS — J309 Allergic rhinitis, unspecified: Secondary | ICD-10-CM

## 2021-01-11 NOTE — Telephone Encounter (Signed)
Medication Refill - Medication: Requesting Albuterol Sulfate (PRO-AIR HFA) -she was given two of these from the hospital and she does not want to be without them with her history of reactions to medicine.   Has the patient contacted their pharmacy? Yes.   (Agent: If no, request that the patient contact the pharmacy for the refill. If patient does not wish to contact the pharmacy document the reason why and proceed with request.) (Agent: If yes, when and what did the pharmacy advise?)  Preferred Pharmacy (with phone number or street name):  [facility] [place], [address]  [place] [address]  Phone: [phone] Fax: [phone]   Has the patient been seen for an appointment in the last year OR does the patient have an upcoming appointment? Yes.    Agent: Please be advised that RX refills may take up to 3 business days. We ask that you follow-up with your pharmacy.

## 2021-01-13 MED ORDER — ALBUTEROL SULFATE HFA 108 (90 BASE) MCG/ACT IN AERS: 2.0000 | INHALATION_SPRAY | Freq: Four times a day (QID) | RESPIRATORY_TRACT | 0 refills | Status: DC | PRN

## 2021-01-13 NOTE — Telephone Encounter (Signed)
Ok to refill Albuterol as requested.

## 2021-01-13 NOTE — Telephone Encounter (Signed)
Refilled sent to [facility]

## 2021-01-13 NOTE — Telephone Encounter (Signed)
Please review

## 2021-01-19 NOTE — Progress Notes (Signed)
   01/20/21  CC:  Chief Complaint  Patient presents with   Cysto      HPI: Glenda Williams is a 80 y.o.female with a personal history of urge incontinence and recurrent UTIs, who presents today for a cystoscopy.   She has had two documented UTIs over the last year 10/07/2020 Proteus mirabilis and 11/11/2020 E.coli.   She also had a episode of gross hematuria and thus presents today for cystoscopy.  She reports that she has been using the estrogen cream prescribed by Larene Beach but found to be expensive, more than $100 a tube.  Today, her urinalysis is suspicious, nitrate positive with many RBCs but few bacteria and greater than 10 epithelials.  She is otherwise asymptomatic and thus we elected to proceed.    Vitals:   01/20/21 0913  BP: 138/75  Pulse: 81   NED. A&Ox3.   No respiratory distress   Abd soft, NT, ND Normal external genitalia with patent urethral meatus  Cystoscopy Procedure Note  Patient identification was confirmed, informed consent was obtained, and patient was prepped using Betadine solution.  Lidocaine jelly was administered per urethral meatus.    Procedure: - Flexible cystoscope introduced, without any difficulty.   - Thorough search of the bladder revealed:    normal urethral meatus    no stones    no ulcers     no tumors    no urethral polyps    no trabeculation - bladder erythema and debris noted, diffuse and nonspecific likely related to chronic cystitis - Ureteral orifices were normal in position and appearance.  Post-Procedure: - Patient tolerated the procedure well  Assessment/ Plan:  Gross hematuria  - Cystoscopy was reassuring  -Called to help facilitate scheduling CT urogram  Recurrent UTIs / chronic cytitis -  Urine >30 WBCs and nitrite positive - Urine sent for cytology  - Urine sent for culture  -Continue estrogen cream, advised that if this is too expensive, can call in the compounded version  F/u 1 month with family  [name] for follow-up CT urogram and symptom recheck   I,Kailey Littlejohn,acting as a scribe for Hollice Espy, MD.,have documented all relevant documentation on the behalf of Hollice Espy, MD,as directed by  Hollice Espy, MD while in the presence of Hollice Espy, MD.'  I have reviewed the above documentation for accuracy and completeness, and I agree with the above.   Hollice Espy, MD

## 2021-01-20 ENCOUNTER — Ambulatory Visit (INDEPENDENT_AMBULATORY_CARE_PROVIDER_SITE_OTHER): Payer: Medicare Other | Admitting: Urology

## 2021-01-20 ENCOUNTER — Encounter: Payer: Self-pay | Admitting: Urology

## 2021-01-20 ENCOUNTER — Other Ambulatory Visit: Payer: Self-pay

## 2021-01-20 VITALS — BP 138/75 | HR 81 | Ht 65.0 in | Wt 243.0 lb

## 2021-01-20 DIAGNOSIS — R310 Gross hematuria: Secondary | ICD-10-CM

## 2021-01-20 LAB — URINALYSIS, COMPLETE
Bilirubin, UA: NEGATIVE
Glucose, UA: NEGATIVE
Ketones, UA: NEGATIVE
Nitrite, UA: POSITIVE — AB
Protein,UA: NEGATIVE
Specific Gravity, UA: 1.02 (ref 1.005–1.030)
Urobilinogen, Ur: 0.2 mg/dL (ref 0.2–1.0)
pH, UA: 5.5 (ref 5.0–7.5)

## 2021-01-20 LAB — MICROSCOPIC EXAMINATION
Epithelial Cells (non renal): >10 /hpf — AB (ref 0–10)
WBC, UA: >30 /hpf — AB (ref 0–5)

## 2021-01-21 LAB — CYTOLOGY - NON PAP

## 2021-01-24 DIAGNOSIS — M4316 Spondylolisthesis, lumbar region: Secondary | ICD-10-CM | POA: Diagnosis not present

## 2021-01-24 DIAGNOSIS — M545 Low back pain, unspecified: Secondary | ICD-10-CM | POA: Diagnosis not present

## 2021-01-24 DIAGNOSIS — M5136 Other intervertebral disc degeneration, lumbar region: Secondary | ICD-10-CM | POA: Diagnosis not present

## 2021-01-24 DIAGNOSIS — M069 Rheumatoid arthritis, unspecified: Secondary | ICD-10-CM | POA: Diagnosis not present

## 2021-01-24 DIAGNOSIS — G8929 Other chronic pain: Secondary | ICD-10-CM | POA: Diagnosis not present

## 2021-01-24 DIAGNOSIS — M25552 Pain in left hip: Secondary | ICD-10-CM | POA: Diagnosis not present

## 2021-01-24 DIAGNOSIS — M5440 Lumbago with sciatica, unspecified side: Secondary | ICD-10-CM | POA: Diagnosis not present

## 2021-01-25 ENCOUNTER — Telehealth: Payer: Self-pay | Admitting: *Deleted

## 2021-01-25 LAB — CULTURE, URINE COMPREHENSIVE

## 2021-01-25 MED ORDER — AMOXICILLIN-POT CLAVULANATE 875-125 MG PO TABS: 1.0000 | ORAL_TABLET | Freq: Two times a day (BID) | ORAL | 0 refills | Status: DC

## 2021-01-25 NOTE — Telephone Encounter (Addendum)
Patient informed, sent in RX to [facility]. Voiced understanding.    ----- Message from Hollice Espy, MD sent at 01/25/2021  7:50 AM EST ----- +UCx, please treat with Augmentin bid x 5 days, likely ok with PCN rash.  Hollice Espy, MD

## 2021-02-07 ENCOUNTER — Other Ambulatory Visit: Payer: Self-pay

## 2021-02-07 ENCOUNTER — Ambulatory Visit
Admission: RE | Admit: 2021-02-07 | Discharge: 2021-02-07 | Disposition: A | Discharge status: Home / Self Care | Payer: Medicare Other | Source: Ambulatory Visit | Attending: Urology | Admitting: Urology

## 2021-02-07 DIAGNOSIS — R310 Gross hematuria: Secondary | ICD-10-CM | POA: Diagnosis not present

## 2021-02-07 DIAGNOSIS — K5730 Diverticulosis of large intestine without perforation or abscess without bleeding: Secondary | ICD-10-CM | POA: Diagnosis not present

## 2021-02-07 DIAGNOSIS — N281 Cyst of kidney, acquired: Secondary | ICD-10-CM | POA: Diagnosis not present

## 2021-02-07 DIAGNOSIS — K7689 Other specified diseases of liver: Secondary | ICD-10-CM | POA: Diagnosis not present

## 2021-02-07 LAB — POCT I-STAT CREATININE: Creatinine, Ser: 0.7 mg/dL (ref 0.44–1.00)

## 2021-02-07 MED ORDER — IOHEXOL 350 MG/ML SOLN
100.0000 mL | Freq: Once | INTRAVENOUS | Status: AC | PRN
  Administered 2021-02-07: 100 mL via INTRAVENOUS

## 2021-02-09 ENCOUNTER — Encounter: Payer: Self-pay | Admitting: Physician Assistant

## 2021-02-09 ENCOUNTER — Ambulatory Visit: Payer: Self-pay

## 2021-02-09 ENCOUNTER — Telehealth (INDEPENDENT_AMBULATORY_CARE_PROVIDER_SITE_OTHER): Payer: Medicare Other | Admitting: Physician Assistant

## 2021-02-09 VITALS — Ht 65.0 in

## 2021-02-09 DIAGNOSIS — U071 COVID-19: Secondary | ICD-10-CM | POA: Diagnosis not present

## 2021-02-09 NOTE — Progress Notes (Signed)
Date:  02/09/2021   Name:  Glenda Williams   DOB:  [birth year]   MRN:  [id number]  VIDEO VISIT  Chief Complaint: Covid Positive  Glenda Williams is a 80 y/o female who presents today after testing positive for COVID-19. Her daughter and husband also recently tested positive. Her temperature at home at the highest was 99.0. Overall feels fatigued, a little nauseous. She has taken Tylenol occasionally. Denies cough, SOB, CP, headaches, nasal congestion, vomiting. Pt overall wasn't even sure if she had COVID as she often feels similiarly with her history of rheumatoid arthritis.   Lab Results  Component Value Date   NA 139 12/07/2020   K 4.3 12/07/2020   CO2 24 12/07/2020   GLUCOSE 87 12/07/2020   BUN 12 12/07/2020   CREATININE 0.70 02/07/2021   CALCIUM 9.4 12/07/2020   EGFR 85 12/07/2020   GFRNONAA 67 11/18/2019   Lab Results  Component Value Date   CHOL 153 12/07/2020   HDL 38 (L) 12/07/2020   LDLCALC 98 12/07/2020   TRIG 88 12/07/2020   CHOLHDL 4.0 12/07/2020   Lab Results  Component Value Date   TSH 2.850 12/07/2020   Lab Results  Component Value Date   HGBA1C 5.8 (H) 12/07/2020   Lab Results  Component Value Date   WBC 9.4 05/17/2020   HGB 12.4 05/17/2020   HCT 40 05/17/2020   MCV 93 05/07/2019   PLT 293 05/17/2020   Lab Results  Component Value Date   ALT 10 12/07/2020   AST 19 12/07/2020   ALKPHOS 79 12/07/2020   BILITOT 0.7 12/07/2020   Lab Results  Component Value Date   VD25OH 43.7 05/07/2019     Review of Systems  Constitutional:  Positive for fatigue and fever.  HENT:  Negative for congestion.   Respiratory:  Negative for cough, shortness of breath and wheezing.   Cardiovascular:  Negative for chest pain.  Gastrointestinal:  Positive for abdominal pain and nausea.  Musculoskeletal:  Positive for neck pain.  Neurological:  Negative for dizziness.   Patient Active Problem List   Diagnosis Date Noted   Bilateral flank pain 11/11/2020   Abdominal  pannus 11/11/2020   Current use of long term anticoagulation 05/07/2019   Dizziness 05/07/2019   Hypertension 04/26/2015   Dry mouth 04/26/2015   Morbid obesity ([place]) 09/04/2014   Hypokalemia 08/12/2014   Allergic rhinitis 08/04/2014   Anxiety 08/04/2014   Cannot sleep 08/04/2014   Neuropathic pain 08/04/2014   Atrial fibrillation ([place]) 08/04/2014   Avitaminosis D 08/04/2014   Gastroesophageal reflux disease without esophagitis 11/07/2013   Polypharmacy 08/19/2013   Osteoarthritis 06/11/2013   Rheumatoid arthritis ([place]) 06/11/2013   Hypercholesteremia 05/12/2009   Benign essential tremor 05/12/2009   Prediabetes 08/26/2008   Adult hypothyroidism 08/26/2008   Menopausal symptom 08/26/2008    Allergies  Allergen Reactions   Bactrim [Sulfamethoxazole-Trimethoprim] Anaphylaxis   Macrobid [Nitrofurantoin Macrocrystal] Rash   Mirabegron Rash   Oxybutynin Rash   Penicillins Rash    Has patient had a PCN reaction causing immediate rash, facial/tongue/throat swelling, SOB or lightheadedness with hypotension: Yes Has patient had a PCN reaction causing severe rash involving mucus membranes or skin necrosis: No Has patient had a PCN reaction that required hospitalization: No Has patient had a PCN reaction occurring within the last 10 years: Unknown If all of the above answers are "NO", then may proceed with Cephalosporin use.     Past Surgical History:  Procedure Laterality Date   BREAST  BIOPSY Left 1988   Benign   BREAST BIOPSY Left 1987   Benign   BREAST EXCISIONAL BIOPSY     BROW LIFT Bilateral 05/23/2016   Procedure: BLEPHAROPLASTY upper eyelid; w/excess skin;  Surgeon: Karle Starch, MD;  Location: [place];  Service: Ophthalmology;  Laterality: Bilateral;   CARPAL TUNNEL RELEASE Bilateral    CHOLECYSTECTOMY     Dr. Bary Castilla   COLONOSCOPY  2006   JOINT REPLACEMENT Bilateral    knees   REPLACEMENT TOTAL KNEE Left 2005   REPLACEMENT TOTAL KNEE Right 1998     Social History   Tobacco Use   Smoking status: Never   Smokeless tobacco: Never  Vaping Use   Vaping Use: Never used  Substance Use Topics   Alcohol use: No   Drug use: No     Medication list has been reviewed and updated.  Current Meds  Medication Sig   cetirizine (ZYRTEC) 10 MG tablet Take 10 mg by mouth daily as needed.    cholecalciferol (VITAMIN D) 400 units TABS tablet Take 400 Units by mouth daily.   conjugated estrogens (PREMARIN) vaginal cream Apply 0.58m (pea-sized amount)  just inside the vaginal introitus with a finger-tip on  Monday, Wednesday and Friday nights.   EPINEPHrine 0.3 mg/0.3 mL IJ SOAJ injection Inject 0.3 mLs into the skin once as needed for anaphylaxis.   folic acid (FOLVITE) 1 MG tablet Take 1 mg by mouth daily.   hydrocortisone cream 1 % Apply 1 application topically as needed for itching.   ketoconazole (NIZORAL) 2 % cream Apply 1 application topically daily.   levothyroxine (SYNTHROID) 100 MCG tablet TAKE 1 TABLET BY MOUTH  DAILY   methotrexate (RHEUMATREX) 2.5 MG tablet Take 20 mg by mouth every Thursday.    nystatin (MYCOSTATIN/NYSTOP) powder Apply 1 application topically 3 (three) times daily. Apply to skin folds   Omega-3 Fatty Acids (FISH OIL) 1000 MG CAPS Take 1 capsule by mouth daily.   omeprazole (PRILOSEC) 20 MG capsule TAKE 1 CAPSULE BY MOUTH  TWICE DAILY BEFORE MEALS   potassium chloride SA (K-DUR,KLOR-CON) 20 MEQ tablet Take 1 tablet by mouth  daily   propranolol (INDERAL) 40 MG tablet 80 mg in the am and 40 mg at night   Red Yeast Rice 600 MG CAPS Take 1 capsule by mouth daily.   rivaroxaban (XARELTO) 20 MG TABS tablet TAKE 1 TABLET BY MOUTH ONCE DAILY   sertraline (ZOLOFT) 50 MG tablet TAKE 3 TABLETS BY MOUTH  DAILY   [DISCONTINUED] albuterol (VENTOLIN HFA) 108 (90 Base) MCG/ACT inhaler Inhale 2 puffs into the lungs every 6 (six) hours as needed for wheezing or shortness of breath.   [DISCONTINUED] hydrOXYzine (ATARAX/VISTARIL) 10  MG tablet Take 1 tablet (10 mg total) by mouth 3 (three) times daily as needed.    PHQ 2/9 Scores 11/29/2020 10/07/2020 05/18/2020 11/18/2019  PHQ - 2 Score 0 0 0 0  PHQ- 9 Score 4 1 1  0  Exception Documentation - - - -    No flowsheet data found.  BP Readings from Last 3 Encounters:  01/20/21 138/75  12/28/20 126/83  11/29/20 125/75    Physical Exam Constitutional:      Appearance: Normal appearance. She is not ill-appearing.  Pulmonary:     Effort: Pulmonary effort is normal. No respiratory distress.  Neurological:     Mental Status: She is oriented to person, place, and time. She is lethargic.  Psychiatric:        Mood  and Affect: Mood normal.        Behavior: Behavior normal.    Wt Readings from Last 3 Encounters:  01/20/21 243 lb (110.2 kg)  12/28/20 243 lb 6.4 oz (110.4 kg)  11/29/20 244 lb 12.8 oz (111 kg)    Ht 5' 5"  (1.651 m)   BMI 40.44 kg/m   Assessment and Plan:  URI COVID 19 Discussed pros and con of antiviral therapy with her current combination of medications,specifically Xarelto. Can increase anticoagulative properties. Due to her current range of symptoms ,feel comfortable with typical viral OTC therapy--Corcidin if she feels congested, saline nasal sprays, Tylenol, staying hydrated with water and Gatorade/pedialyte.   Pt and daughter verbalized understanding that if symptoms worsen they should contact the office.  I, Mikey Kirschner, PA-C have reviewed all documentation for this visit. The documentation on  02/09/2021 for the exam, diagnosis, procedures, and orders are all accurate and complete.  Mikey Kirschner, PA-C [facility] [address] [place], [place], [address] Office: [phone] Fax: [phone]

## 2021-02-09 NOTE — Telephone Encounter (Signed)
Pt's Daughter Caren Griffins calling in stating that pt has fever or 100 and has been exposed to [place]. Daughter and husband have both tested positive for COVID. They were all together Saturday for several hours in a car together and went out to eat. Daughter is at pt's home but wearing a mask. Pt is higher risk for COVID d/t medications she takes for arthritis per daughter. First available appt was tomorrow VV at 79 but daughter asked for sooner appt. Called and spoke with [place], [facility] who was able to schedule VV today at 1320 with [place], [place]. Care advice given and daughter verbalized understanding. No other questions/concerns noted.    Reason for Disposition  [1] CLOSE CONTACT COVID-19 EXPOSURE within last 14 days AND [2] weak immune system (e.g., HIV positive, cancer chemo, splenectomy, organ transplant, chronic steroids) AND [3] NO symptoms  Answer Assessment - Initial Assessment Questions 1. COVID-19 EXPOSURE: "Please describe how you were exposed to someone with a COVID-19 infection."     Yes daughter and husband 2. PLACE of CONTACT: "Where were you when you were exposed to COVID-19?" (e.g., home, school, medical waiting room; which city?)     home 3. TYPE of CONTACT: "How much contact was there?" (e.g., sitting next to, live in same house, work in same office, same building)     Same household 4. DURATION of CONTACT: "How long were you in contact with the COVID-19 patient?" (e.g., a few seconds, passed by person, a few minutes, 15 minutes or longer, live with the patient)     Couple of hours 5. MASK: "Were you wearing a mask?" "Was the other person wearing a mask?" Note: wearing a mask reduces the risk of an otherwise close contact.     Husband and Daughter together Saturday  6. DATE of CONTACT: "When did you have contact with a COVID-19 patient?" (e.g., how many days ago)     Saturday 7. COMMUNITY SPREAD: "Are there lots of cases of COVID-19 (community spread) where you live?" (See public  health department website, if unsure)       NA 8. SYMPTOMS: "Do you have any symptoms?" (e.g., fever, cough, breathing difficulty, loss of taste or smell)     Fever, head sore,  9. VACCINE: "Have you gotten the COVID-19 vaccine?" If Yes, ask: "Which one, how many shots, when did you get it?"     yes 10. BOOSTER: "Have you received your COVID-19 booster?" If Yes, ask: "Which one and when did you get it?"       yes 11. PREGNANCY OR POSTPARTUM: "Is there any chance you are pregnant?" "When was your last menstrual period?" "Did you deliver in the last 2 weeks?"       No 12. HIGH RISK: "Do you have any heart or lung problems?" (e.g., asthma , COPD, heart failure) "Do you have a weak immune system or other risk factors?" (e.g., HIV positive, chemotherapy, renal failure, diabetes mellitus, sickle cell anemia, obesity)       medications 13. TRAVEL: "Have you traveled out of the country recently?" If Yes, ask: "When and where?"  Note: Travel becomes less relevant if there is widespread community transmission where the patient lives.       No  Protocols used: Coronavirus (COVID-19) Exposure-A-AH

## 2021-02-10 NOTE — Telephone Encounter (Signed)
Pt had VV yesterday 02/09/2021.  KP

## 2021-02-13 ENCOUNTER — Encounter: Payer: Self-pay | Admitting: Physician Assistant

## 2021-02-15 NOTE — Progress Notes (Signed)
02/16/2021 11:04 AM   Glenda Williams 04/22/1940 [id number]  Referring provider: Virginia Crews, [place],  [place] [address]  Chief Complaint  Patient presents with   Results     Urological history: 1. Urge incontinence -contributing factors of age, vaginal atrophy, obesity, vaginal delivery, a family history of incontinence, caffeine, depression, antihistamines, decongestants, ACE inhibitors, antiarrhythmics, diuretics and antidepressants -managed with incontinence pads   2. rUTI's -contributing factors of age, vaginal atrophy and constipation -documented positive urine culture over the last year  10/07/2020 Proteus mirabilis  11/11/2020 E.coli   12/28/2020 E.coli  01/20/2021 E.coli  3. High risk hematuria -non-smoker -CT urogram 02/2021 - NED -cysto 01/2021 - NED -urine cytology 01/2021 - negative -no reports of gross heme  4. Renal cyst -CTU 2022 - Subcentimeter left interpolar renal cyst  HPI: Glenda Williams is a 80 y.o. female who presents today for CT urogram  to complete gross heme work up.    CT urogram 02/2021 sub centimeter left interpolar renal cyst  She states she has not had issues with urinary tract infections since her last visit with [place].  She has no other urinary complaints today.  Patient denies any modifying or aggravating factors.  Patient denies any gross hematuria, dysuria or suprapubic/flank pain.  Patient denies any fevers, chills, nausea or vomiting.    She is finding the vaginal estrogen cream cost prohibitive.  PMH: Past Medical History:  Diagnosis Date   A-fib [facility])    Arthritis    Bell palsy 09/04/2014   GERD (gastroesophageal reflux disease)    Heart disease    Rheumatoid arthritis (HCC)    Thyroid disease    Tremors of nervous system     Surgical History: Past Surgical History:  Procedure Laterality Date   BREAST BIOPSY Left 1988   Benign   BREAST BIOPSY Left 1987   Benign   BREAST  EXCISIONAL BIOPSY     BROW LIFT Bilateral 05/23/2016   Procedure: BLEPHAROPLASTY upper eyelid; w/excess skin;  Surgeon: Karle Starch, MD;  Location: [place];  Service: Ophthalmology;  Laterality: Bilateral;   CARPAL TUNNEL RELEASE Bilateral    CHOLECYSTECTOMY     Dr. Bary Castilla   COLONOSCOPY  2006   JOINT REPLACEMENT Bilateral    knees   REPLACEMENT TOTAL KNEE Left 2005   REPLACEMENT TOTAL KNEE Right 1998    Home Medications:  Allergies as of 02/16/2021       Reactions   Bactrim [sulfamethoxazole-trimethoprim] Anaphylaxis   Macrobid [nitrofurantoin Macrocrystal] Rash   Mirabegron Rash   Oxybutynin Rash   Penicillins Rash   Has patient had a PCN reaction causing immediate rash, facial/tongue/throat swelling, SOB or lightheadedness with hypotension: Yes Has patient had a PCN reaction causing severe rash involving mucus membranes or skin necrosis: No Has patient had a PCN reaction that required hospitalization: No Has patient had a PCN reaction occurring within the last 10 years: Unknown If all of the above answers are "NO", then may proceed with Cephalosporin use.        Medication List        Accurate as of February 16, 2021 11:04 AM. If you have any questions, ask your nurse or doctor.          cetirizine 10 MG tablet Commonly known as: ZYRTEC Take 10 mg by mouth daily as needed.   cholecalciferol 10 MCG (400 UNIT) Tabs tablet Commonly known as: VITAMIN D3 Take 400 Units by  mouth daily.   EPINEPHrine 0.3 mg/0.3 mL Soaj injection Commonly known as: EPI-PEN Inject 0.3 mLs into the skin once as needed for anaphylaxis.   estradiol 0.1 MG/GM vaginal cream Commonly known as: ESTRACE Apply 0.5 g to the vaginal introitus on Monday, Wednesday and Friday nights Started by: Larene Beach Taresa [place], PA-C   Fish Oil 1000 MG Caps Take 1 capsule by mouth daily.   folic acid 1 MG tablet Commonly known as: FOLVITE Take 1 mg by mouth daily.   hydrocortisone cream 1  % Apply 1 application topically as needed for itching.   ketoconazole 2 % cream Commonly known as: NIZORAL Apply 1 application topically daily.   levothyroxine 100 MCG tablet Commonly known as: SYNTHROID TAKE 1 TABLET BY MOUTH  DAILY   methotrexate 2.5 MG tablet Commonly known as: RHEUMATREX Take 20 mg by mouth every Thursday.   nystatin powder Commonly known as: MYCOSTATIN/NYSTOP Apply 1 application topically 3 (three) times daily. Apply to skin folds   omeprazole 20 MG capsule Commonly known as: PRILOSEC TAKE 1 CAPSULE BY MOUTH  TWICE DAILY BEFORE MEALS   potassium chloride SA 20 MEQ tablet Commonly known as: KLOR-CON M Take 1 tablet by mouth  daily   Premarin vaginal cream Generic drug: conjugated estrogens Apply 0.5mg  (pea-sized amount)  just inside the vaginal introitus with a finger-tip on  Monday, Wednesday and Friday nights.   propranolol 40 MG tablet Commonly known as: INDERAL 80 mg in the am and 40 mg at night   Red Yeast Rice 600 MG Caps Take 1 capsule by mouth daily.   rivaroxaban 20 MG Tabs tablet Commonly known as: XARELTO TAKE 1 TABLET BY MOUTH ONCE DAILY   sertraline 50 MG tablet Commonly known as: ZOLOFT TAKE 3 TABLETS BY MOUTH  DAILY        Allergies:  Allergies  Allergen Reactions   Bactrim [Sulfamethoxazole-Trimethoprim] Anaphylaxis   Macrobid [Nitrofurantoin Macrocrystal] Rash   Mirabegron Rash   Oxybutynin Rash   Penicillins Rash    Has patient had a PCN reaction causing immediate rash, facial/tongue/throat swelling, SOB or lightheadedness with hypotension: Yes Has patient had a PCN reaction causing severe rash involving mucus membranes or skin necrosis: No Has patient had a PCN reaction that required hospitalization: No Has patient had a PCN reaction occurring within the last 10 years: Unknown If all of the above answers are "NO", then may proceed with Cephalosporin use.     Family History: Family History  Problem Relation Age  of Onset   Stroke Mother    Hypertension Mother    Heart disease Father    Arthritis Father    Parkinson's disease Sister    Parkinson's disease Brother    Bladder Cancer Brother    Breast cancer Neg Hx    Kidney cancer Neg Hx     Social History:  reports that she has never smoked. She has never used smokeless tobacco. She reports that she does not drink alcohol and does not use drugs.  ROS: Pertinent ROS in HPI  Physical Exam: BP (!) 182/80    Pulse 62    Ht 5\' 5"  (1.651 m)    Wt 245 lb (111.1 kg)    BMI 40.77 kg/m   Constitutional:  Well nourished. Alert and oriented, No acute distress. HEENT: [place] AT, mask in place.  Trachea midline Cardiovascular: No clubbing, cyanosis, or edema. Respiratory: Normal respiratory effort, no increased work of breathing. Neurologic: Grossly intact, no focal deficits, moving all 4 extremities. Psychiatric:  Normal mood and affect.    Laboratory Data: N/A   Pertinent Imaging: N/A    Assessment & Plan:    1. High risk hematuria -Reviewed CT findings with patient -work up 2022 - NED -no reports of gross heme  2. rUTI's -continue preventative strategies  3. Vaginal atrophy -prescription vaginal estrogen cream cost prohibitive      -I have called in the compounded formulation to [facility] for her                                       Return in about 6 months (around 08/17/2021) for Symptom recheck and UA.  These notes generated with voice recognition software. I apologize for typographical errors.  Zara Council, PA-C  [facility] [address]  [place], [place] [address] [phone]

## 2021-02-16 ENCOUNTER — Encounter: Payer: Self-pay | Admitting: Urology

## 2021-02-16 ENCOUNTER — Other Ambulatory Visit: Payer: Self-pay

## 2021-02-16 ENCOUNTER — Ambulatory Visit (INDEPENDENT_AMBULATORY_CARE_PROVIDER_SITE_OTHER): Payer: Medicare Other | Admitting: Urology

## 2021-02-16 VITALS — BP 182/80 | HR 62 | Ht 65.0 in | Wt 245.0 lb

## 2021-02-16 DIAGNOSIS — N390 Urinary tract infection, site not specified: Secondary | ICD-10-CM

## 2021-02-16 DIAGNOSIS — R310 Gross hematuria: Secondary | ICD-10-CM

## 2021-02-16 DIAGNOSIS — N952 Postmenopausal atrophic vaginitis: Secondary | ICD-10-CM | POA: Diagnosis not present

## 2021-02-16 MED ORDER — ESTRADIOL 0.1 MG/GM VA CREA: TOPICAL_CREAM | VAGINAL | 12 refills | Status: DC

## 2021-03-21 DIAGNOSIS — M069 Rheumatoid arthritis, unspecified: Secondary | ICD-10-CM | POA: Diagnosis not present

## 2021-03-22 DIAGNOSIS — M5442 Lumbago with sciatica, left side: Secondary | ICD-10-CM | POA: Diagnosis not present

## 2021-03-22 DIAGNOSIS — M25552 Pain in left hip: Secondary | ICD-10-CM | POA: Diagnosis not present

## 2021-03-22 DIAGNOSIS — G8929 Other chronic pain: Secondary | ICD-10-CM | POA: Diagnosis not present

## 2021-03-28 ENCOUNTER — Telehealth: Payer: Self-pay | Admitting: Family Medicine

## 2021-03-28 DIAGNOSIS — M25552 Pain in left hip: Secondary | ICD-10-CM | POA: Diagnosis not present

## 2021-03-28 DIAGNOSIS — G8929 Other chronic pain: Secondary | ICD-10-CM | POA: Diagnosis not present

## 2021-03-28 MED ORDER — ALBUTEROL SULFATE HFA 108 (90 BASE) MCG/ACT IN AERS: 2.0000 | INHALATION_SPRAY | Freq: Four times a day (QID) | RESPIRATORY_TRACT | 2 refills | Status: DC | PRN

## 2021-03-28 NOTE — Telephone Encounter (Signed)
Can we just check with the patient to see if she even needs it??

## 2021-03-28 NOTE — Telephone Encounter (Signed)
LOV: 02/09/2021 (Video Visit positive Covid)  NOV: 03/31/2021   Last Refill: 10/22/2017 1 Inhaler 0 Refills.   Thanks,   -Mickel Baas

## 2021-03-28 NOTE — Telephone Encounter (Signed)
[facility] faxed refill request for the following medications:  albuterol (VENTOLIN HFA) 108 (90 Base) MCG/ACT inhaler   Please advise.

## 2021-03-28 NOTE — Telephone Encounter (Signed)
Ok to send refill  

## 2021-03-28 NOTE — Telephone Encounter (Signed)
Refilled

## 2021-03-28 NOTE — Telephone Encounter (Signed)
Per Patients she does needs it. She would like to have some on hand.

## 2021-03-30 NOTE — Progress Notes (Deleted)
Established patient visit   Patient: Glenda Williams   DOB: 03/03/1941   81 y.o. Female  MRN: [id number] Visit Date: 03/31/2021  Today's healthcare provider: Lavon Paganini, MD   No chief complaint on file.  Subjective    HPI  Hypertension, follow-up  BP Readings from Last 3 Encounters:  02/16/21 (!) 182/80  01/20/21 138/75  12/28/20 126/83   Wt Readings from Last 3 Encounters:  02/16/21 245 lb (111.1 kg)  01/20/21 243 lb (110.2 kg)  12/28/20 243 lb 6.4 oz (110.4 kg)     She was last seen for hypertension 4 months ago.  BP at that visit was 125/75. Management since that visit includes continue current treatment plan.  She reports {excellent/good/fair/poor:19665} compliance with treatment. She {is/is not:9024} having side effects. {document side effects if present:1} She is following a {diet:21022986} diet. She {is/is not:9024} exercising. She {does/does not:200015} smoke.  Use of agents associated with hypertension: {bp agents assoc with hypertension:511::"none"}.   Outside blood pressures are {***enter patient reported home BP readings, or 'not being checked':1}. Symptoms: {Yes/No:20286} chest pain {Yes/No:20286} chest pressure  {Yes/No:20286} palpitations {Yes/No:20286} syncope  {Yes/No:20286} dyspnea {Yes/No:20286} orthopnea  {Yes/No:20286} paroxysmal nocturnal dyspnea {Yes/No:20286} lower extremity edema   Pertinent labs: Lab Results  Component Value Date   CHOL 153 12/07/2020   HDL 38 (L) 12/07/2020   LDLCALC 98 12/07/2020   TRIG 88 12/07/2020   CHOLHDL 4.0 12/07/2020   Lab Results  Component Value Date   NA 139 12/07/2020   K 4.3 12/07/2020   CREATININE 0.70 02/07/2021   EGFR 85 12/07/2020   GLUCOSE 87 12/07/2020   TSH 2.850 12/07/2020     The 10-year ASCVD risk score (Arnett DK, et al., 2019) is: 37.8%   ---------------------------------------------------------------------------------------------------   Medications: Outpatient  Medications Prior to Visit  Medication Sig   albuterol (VENTOLIN HFA) 108 (90 Base) MCG/ACT inhaler Inhale 2 puffs into the lungs every 6 (six) hours as needed for wheezing or shortness of breath.   cetirizine (ZYRTEC) 10 MG tablet Take 10 mg by mouth daily as needed.    cholecalciferol (VITAMIN D) 400 units TABS tablet Take 400 Units by mouth daily.   conjugated estrogens (PREMARIN) vaginal cream Apply 0.89m (pea-sized amount)  just inside the vaginal introitus with a finger-tip on  Monday, Wednesday and Friday nights.   EPINEPHrine 0.3 mg/0.3 mL IJ SOAJ injection Inject 0.3 mLs into the skin once as needed for anaphylaxis.   estradiol (ESTRACE) 0.1 MG/GM vaginal cream Apply 0.5 g to the vaginal introitus on Monday, Wednesday and Friday nights   folic acid (FOLVITE) 1 MG tablet Take 1 mg by mouth daily.   hydrocortisone cream 1 % Apply 1 application topically as needed for itching.   ketoconazole (NIZORAL) 2 % cream Apply 1 application topically daily.   levothyroxine (SYNTHROID) 100 MCG tablet TAKE 1 TABLET BY MOUTH  DAILY   methotrexate (RHEUMATREX) 2.5 MG tablet Take 20 mg by mouth every Thursday.    nystatin (MYCOSTATIN/NYSTOP) powder Apply 1 application topically 3 (three) times daily. Apply to skin folds   Omega-3 Fatty Acids (FISH OIL) 1000 MG CAPS Take 1 capsule by mouth daily.   omeprazole (PRILOSEC) 20 MG capsule TAKE 1 CAPSULE BY MOUTH  TWICE DAILY BEFORE MEALS   potassium chloride SA (K-DUR,KLOR-CON) 20 MEQ tablet Take 1 tablet by mouth  daily   propranolol (INDERAL) 40 MG tablet 80 mg in the am and 40 mg at night   Red Yeast Rice  600 MG CAPS Take 1 capsule by mouth daily.   rivaroxaban (XARELTO) 20 MG TABS tablet TAKE 1 TABLET BY MOUTH ONCE DAILY   sertraline (ZOLOFT) 50 MG tablet TAKE 3 TABLETS BY MOUTH  DAILY   No facility-administered medications prior to visit.    Review of Systems  {Labs   Heme   Chem   Endocrine   Serology   Results Review (optional):23779}   Objective     There were no vitals taken for this visit. {Show previous vital signs (optional):23777}  Physical Exam  ***  No results found for any visits on 03/31/21.  Assessment & Plan     ***  No follow-ups on file.      {provider attestation***:1}   Lavon Paganini, MD  [facility] [phone] (phone) [phone] (fax)  [place]

## 2021-03-31 ENCOUNTER — Ambulatory Visit: Payer: Medicare Other | Admitting: Family Medicine

## 2021-03-31 ENCOUNTER — Other Ambulatory Visit: Payer: Self-pay

## 2021-03-31 MED ORDER — ALBUTEROL SULFATE HFA 108 (90 BASE) MCG/ACT IN AERS: 2.0000 | INHALATION_SPRAY | Freq: Four times a day (QID) | RESPIRATORY_TRACT | 2 refills | Status: DC | PRN

## 2021-04-08 ENCOUNTER — Other Ambulatory Visit: Payer: Self-pay | Admitting: Family Medicine

## 2021-04-08 NOTE — Telephone Encounter (Signed)
Requested Prescriptions  Pending Prescriptions Disp Refills   levothyroxine (SYNTHROID) 100 MCG tablet [Pharmacy Med Name: Levothyroxine Sodium 100 MCG Oral Tablet] 90 tablet 3    Sig: TAKE 1 TABLET BY MOUTH  DAILY     Endocrinology:  Hypothyroid Agents Passed - 04/08/2021  4:37 AM      Passed - TSH in normal range and within 360 days    TSH  Date Value Ref Range Status  12/07/2020 2.850 0.450 - 4.500 uIU/mL Final         Passed - Valid encounter within last 12 months    Recent Outpatient Visits          1 month ago [facility] Thedore Mins, [place], PA-C   4 months ago Encounter for [employer] annual wellness exam   [facility] [place], Dionne Bucy, MD   4 months ago Hematuria, unspecified type   [facility] Tally Joe T, FNP   6 months ago Acute cystitis with hematuria   [facility] [name], FNP   10 months ago Primary hypertension   [facility] [place], Dionne Bucy, MD      Future Appointments            In 4 months [place], Gordan [facility]

## 2021-04-11 DIAGNOSIS — M5442 Lumbago with sciatica, left side: Secondary | ICD-10-CM | POA: Diagnosis not present

## 2021-04-11 DIAGNOSIS — M25552 Pain in left hip: Secondary | ICD-10-CM | POA: Diagnosis not present

## 2021-04-11 DIAGNOSIS — G8929 Other chronic pain: Secondary | ICD-10-CM | POA: Diagnosis not present

## 2021-04-14 DIAGNOSIS — M5115 Intervertebral disc disorders with radiculopathy, thoracolumbar region: Secondary | ICD-10-CM | POA: Diagnosis not present

## 2021-04-14 DIAGNOSIS — M4316 Spondylolisthesis, lumbar region: Secondary | ICD-10-CM | POA: Diagnosis not present

## 2021-04-14 DIAGNOSIS — M4727 Other spondylosis with radiculopathy, lumbosacral region: Secondary | ICD-10-CM | POA: Diagnosis not present

## 2021-04-14 DIAGNOSIS — M4807 Spinal stenosis, lumbosacral region: Secondary | ICD-10-CM | POA: Diagnosis not present

## 2021-04-14 DIAGNOSIS — M48061 Spinal stenosis, lumbar region without neurogenic claudication: Secondary | ICD-10-CM | POA: Diagnosis not present

## 2021-04-14 DIAGNOSIS — M5134 Other intervertebral disc degeneration, thoracic region: Secondary | ICD-10-CM | POA: Diagnosis not present

## 2021-04-14 DIAGNOSIS — M5116 Intervertebral disc disorders with radiculopathy, lumbar region: Secondary | ICD-10-CM | POA: Diagnosis not present

## 2021-04-14 DIAGNOSIS — M4726 Other spondylosis with radiculopathy, lumbar region: Secondary | ICD-10-CM | POA: Diagnosis not present

## 2021-04-14 DIAGNOSIS — M5117 Intervertebral disc disorders with radiculopathy, lumbosacral region: Secondary | ICD-10-CM | POA: Diagnosis not present

## 2021-04-18 ENCOUNTER — Telehealth: Payer: Self-pay | Admitting: Family Medicine

## 2021-04-18 DIAGNOSIS — B372 Candidiasis of skin and nail: Secondary | ICD-10-CM

## 2021-04-18 NOTE — Telephone Encounter (Signed)
[facility] faxed refill request for the following medications:  ketoconazole (NIZORAL) 2 % cream   Please advise.

## 2021-04-19 DIAGNOSIS — M5442 Lumbago with sciatica, left side: Secondary | ICD-10-CM | POA: Diagnosis not present

## 2021-04-19 DIAGNOSIS — M48062 Spinal stenosis, lumbar region with neurogenic claudication: Secondary | ICD-10-CM | POA: Diagnosis not present

## 2021-04-19 DIAGNOSIS — G8929 Other chronic pain: Secondary | ICD-10-CM | POA: Diagnosis not present

## 2021-04-19 DIAGNOSIS — M25552 Pain in left hip: Secondary | ICD-10-CM | POA: Diagnosis not present

## 2021-04-20 MED ORDER — KETOCONAZOLE 2 % EX CREA: 1.0000 "application " | TOPICAL_CREAM | Freq: Every day | CUTANEOUS | 2 refills | Status: AC

## 2021-04-20 NOTE — Addendum Note (Signed)
Addended by: Shawna Orleans on: 04/20/2021 08:59 AM   Modules accepted: Orders

## 2021-05-16 DIAGNOSIS — M069 Rheumatoid arthritis, unspecified: Secondary | ICD-10-CM | POA: Diagnosis not present

## 2021-05-18 DIAGNOSIS — I38 Endocarditis, valve unspecified: Secondary | ICD-10-CM | POA: Diagnosis not present

## 2021-05-18 DIAGNOSIS — E7800 Pure hypercholesterolemia, unspecified: Secondary | ICD-10-CM | POA: Diagnosis not present

## 2021-05-18 DIAGNOSIS — I480 Paroxysmal atrial fibrillation: Secondary | ICD-10-CM | POA: Diagnosis not present

## 2021-05-18 DIAGNOSIS — I10 Essential (primary) hypertension: Secondary | ICD-10-CM | POA: Diagnosis not present

## 2021-05-18 DIAGNOSIS — Z7901 Long term (current) use of anticoagulants: Secondary | ICD-10-CM | POA: Diagnosis not present

## 2021-05-18 DIAGNOSIS — Z6841 Body Mass Index (BMI) 40.0 and over, adult: Secondary | ICD-10-CM | POA: Diagnosis not present

## 2021-05-19 DIAGNOSIS — G8929 Other chronic pain: Secondary | ICD-10-CM | POA: Diagnosis not present

## 2021-05-19 DIAGNOSIS — M25552 Pain in left hip: Secondary | ICD-10-CM | POA: Diagnosis not present

## 2021-05-19 DIAGNOSIS — M5442 Lumbago with sciatica, left side: Secondary | ICD-10-CM | POA: Diagnosis not present

## 2021-05-19 DIAGNOSIS — M48062 Spinal stenosis, lumbar region with neurogenic claudication: Secondary | ICD-10-CM | POA: Diagnosis not present

## 2021-06-01 ENCOUNTER — Other Ambulatory Visit: Payer: Self-pay | Admitting: Family Medicine

## 2021-06-16 ENCOUNTER — Other Ambulatory Visit: Payer: Self-pay | Admitting: Family Medicine

## 2021-06-16 DIAGNOSIS — B372 Candidiasis of skin and nail: Secondary | ICD-10-CM

## 2021-06-17 NOTE — Telephone Encounter (Signed)
Requested medication (s) are due for refill today: yes ? ?Requested medication (s) are on the active medication list: yes ? ?Last refill:  11/11/20 60 grams 1 RF ? ?Future visit scheduled: no ? ?Notes to clinic:  med is not assigned to a protocol ? ? ?Requested Prescriptions  ?Pending Prescriptions Disp Refills  ? NYSTATIN powder [Pharmacy Med Name: NYSTOP TOP PWDR 100,000 30GM] 60 g 1  ?  Sig: APPLY TOPICALLY TO SKIN FOLDS THREE TIMES DAILY  ?  ? Off-Protocol Failed - 06/16/2021  5:41 PM  ?  ?  Failed - Medication not assigned to a protocol, review manually.  ?  ?  Passed - Valid encounter within last 12 months  ?  Recent Outpatient Visits   ? ?      ? 4 months ago COVID-19  ? [facility] Mikey Kirschner, PA-C  ? 6 months ago Encounter for [employer] annual wellness exam  ? [facility], Dionne Bucy, MD  ? 7 months ago Hematuria, unspecified type  ? [facility] [name], FNP  ? 8 months ago Acute cystitis with hematuria  ? [facility] Tally Joe T, FNP  ? 1 year ago Primary hypertension  ? [facility] [place], Dionne Bucy, MD  ? ?  ?  ?Future Appointments   ? ?        ? In 2 months [place], Gordan Payment [place]  ? ?  ? ?  ?  ?  ? ? ? ? ?

## 2021-06-19 ENCOUNTER — Other Ambulatory Visit: Payer: Self-pay | Admitting: Family Medicine

## 2021-06-19 DIAGNOSIS — F419 Anxiety disorder, unspecified: Secondary | ICD-10-CM

## 2021-06-20 NOTE — Telephone Encounter (Signed)
LOV: 02/09/2021 Qty:270 R:1 (Take 3 tablets by mouth daily) ?LR:11/04/2020 ?NOV: none  ?

## 2021-07-11 DIAGNOSIS — M069 Rheumatoid arthritis, unspecified: Secondary | ICD-10-CM | POA: Diagnosis not present

## 2021-07-11 DIAGNOSIS — Z20822 Contact with and (suspected) exposure to covid-19: Secondary | ICD-10-CM | POA: Diagnosis not present

## 2021-07-26 ENCOUNTER — Telehealth: Payer: Self-pay

## 2021-07-26 NOTE — Telephone Encounter (Signed)
Copied from [place] [id number]. Topic: General - Other >> Jul 26, 2021  3:57 PM Erick Blinks wrote: Reason for CRM: Pt called requesting to receive samples of Xarelto, please advise. She has a shipment coming in the mail and wants to receive samples since she is leaving town soon. Please advise   Best contact: [phone]

## 2021-07-26 NOTE — Telephone Encounter (Signed)
Patient advised to pick up sample of Xarelto at front desk.

## 2021-08-10 NOTE — Progress Notes (Addendum)
I,Roshena L Chambers,acting as a scribe for [name], FNP.,have documented all relevant documentation on the behalf of [name], FNP,as directed by  [name], FNP while in the presence of [name], FNP.   Established patient visit   Patient: Glenda Williams   DOB: Dec 15, 1940   81 y.o. Female  MRN: [id number] Visit Date: 08/11/2021  Today's healthcare provider: [name], FNP  Re Introduced to nurse practitioner role and practice setting.  All questions answered.  Discussed provider/patient relationship and expectations.  Chief Complaint  Patient presents with   Edema   Subjective    HPI  Edema: Patient complains of swelling in her feet and legs for the past 8 weeks. Associated symptoms include stinging pain in legs and shortness of breath during exertion.  Medications: Outpatient Medications Prior to Visit  Medication Sig   albuterol (VENTOLIN HFA) 108 (90 Base) MCG/ACT inhaler Inhale 2 puffs into the lungs every 6 (six) hours as needed for wheezing or shortness of breath.   cetirizine (ZYRTEC) 10 MG tablet Take 10 mg by mouth daily as needed.    cholecalciferol (VITAMIN D) 400 units TABS tablet Take 400 Units by mouth daily.   conjugated estrogens (PREMARIN) vaginal cream Apply 0.49m (pea-sized amount)  just inside the vaginal introitus with a finger-tip on  Monday, Wednesday and Friday nights.   EPINEPHrine 0.3 mg/0.3 mL IJ SOAJ injection Inject 0.3 mLs into the skin once as needed for anaphylaxis.   estradiol (ESTRACE) 0.1 MG/GM vaginal cream Apply 0.5 g to the vaginal introitus on Monday, Wednesday and Friday nights   folic acid (FOLVITE) 1 MG tablet Take 1 mg by mouth daily.   gabapentin (NEURONTIN) 100 MG capsule Take 100 mg by mouth 3 (three) times daily.   hydrocortisone cream 1 % Apply 1 application topically as needed for itching.   ketoconazole (NIZORAL) 2 % cream Apply 1 application topically daily.   levothyroxine (SYNTHROID) 100 MCG tablet TAKE  1 TABLET BY MOUTH  DAILY   methotrexate (RHEUMATREX) 2.5 MG tablet Take 20 mg by mouth every Thursday.    NYSTATIN powder APPLY TOPICALLY TO SKIN FOLDS THREE TIMES DAILY   Omega-3 Fatty Acids (FISH OIL) 1000 MG CAPS Take 1 capsule by mouth daily.   omeprazole (PRILOSEC) 20 MG capsule TAKE 1 CAPSULE BY MOUTH  TWICE DAILY BEFORE MEALS. Please schedule office visit before any future refill.   potassium chloride SA (K-DUR,KLOR-CON) 20 MEQ tablet Take 1 tablet by mouth  daily   propranolol (INDERAL) 40 MG tablet 80 mg in the am and 40 mg at night   Red Yeast Rice 600 MG CAPS Take 1 capsule by mouth daily.   rivaroxaban (XARELTO) 20 MG TABS tablet TAKE 1 TABLET BY MOUTH ONCE DAILY   sertraline (ZOLOFT) 50 MG tablet TAKE 3 TABLETS BY MOUTH  DAILY   No facility-administered medications prior to visit.    Review of Systems  Constitutional:  Negative for appetite change, chills, fatigue and fever.  Respiratory:  Negative for chest tightness and shortness of breath.   Cardiovascular:  Positive for leg swelling. Negative for chest pain and palpitations.  Gastrointestinal:  Negative for abdominal pain, nausea and vomiting.  Neurological:  Negative for dizziness and weakness.  Psychiatric/Behavioral:         Excessive sleepiness    Last CBC Lab Results  Component Value Date   WBC 9.4 05/17/2020   HGB 12.4 05/17/2020   HCT 40 05/17/2020  MCV 93 05/07/2019   MCH 30.3 05/07/2019   RDW 13.1 05/07/2019   PLT 293 38/88/2800   Last metabolic panel Lab Results  Component Value Date   GLUCOSE 87 12/07/2020   NA 139 12/07/2020   K 4.3 12/07/2020   CL 102 12/07/2020   CO2 24 12/07/2020   BUN 12 12/07/2020   CREATININE 0.70 02/07/2021   EGFR 85 12/07/2020   CALCIUM 9.4 12/07/2020   PHOS 2.7 11/05/2017   PROT 6.9 12/07/2020   ALBUMIN 3.7 12/07/2020   LABGLOB 3.2 12/07/2020   AGRATIO 1.2 12/07/2020   BILITOT 0.7 12/07/2020   ALKPHOS 79 12/07/2020   AST 19 12/07/2020   ALT 10 12/07/2020    ANIONGAP 9 11/07/2017   Last lipids Lab Results  Component Value Date   CHOL 153 12/07/2020   HDL 38 (L) 12/07/2020   LDLCALC 98 12/07/2020   TRIG 88 12/07/2020   CHOLHDL 4.0 12/07/2020   Last hemoglobin A1c Lab Results  Component Value Date   HGBA1C 5.8 (H) 12/07/2020   Last thyroid functions Lab Results  Component Value Date   TSH 2.850 12/07/2020   Last vitamin D Lab Results  Component Value Date   VD25OH 43.7 05/07/2019   Last vitamin B12 and Folate No results found for: "VITAMINB12", "FOLATE"     Objective    BP (!) 145/62 (BP Location: Left Arm, Patient Position: Sitting, Cuff Size: Large)   Pulse (!) 53   Temp 97.7 F (36.5 C) (Oral)   Resp 18   Wt 231 lb (104.8 kg)   SpO2 100% Comment: room air  BMI 38.44 kg/m  BP Readings from Last 3 Encounters:  08/11/21 (!) 145/62  02/16/21 (!) 182/80  01/20/21 138/75   Wt Readings from Last 3 Encounters:  08/11/21 231 lb (104.8 kg)  02/16/21 245 lb (111.1 kg)  01/20/21 243 lb (110.2 kg)   SpO2 Readings from Last 3 Encounters:  08/11/21 100%  10/07/20 99%  03/29/20 98%     Today's Vitals   08/11/21 0955 08/11/21 1004  BP: (!) 120/49 (!) 145/62  Pulse: (!) 53   Resp: 18   Temp: 97.7 F (36.5 C)   TempSrc: Oral   SpO2: 100%   Weight: 231 lb (104.8 kg)    Body mass index is 38.44 kg/m.    Physical Exam Vitals and nursing note reviewed.  Constitutional:      General: She is not in acute distress.    Appearance: Normal appearance. She is obese. She is not ill-appearing, toxic-appearing or diaphoretic.  HENT:     Head: Normocephalic and atraumatic.  Cardiovascular:     Rate and Rhythm: Bradycardia present. Rhythm irregular.     Pulses: Normal pulses.     Heart sounds: No murmur heard.    Friction rub present. No gallop.  Pulmonary:     Effort: Pulmonary effort is normal. No respiratory distress.     Breath sounds: Normal breath sounds. No stridor. No wheezing, rhonchi or rales.  Chest:      Chest wall: No tenderness.  Abdominal:     General: Bowel sounds are normal.     Palpations: Abdomen is soft.  Musculoskeletal:        General: Tenderness present. No swelling, deformity or signs of injury. Normal range of motion.     Right lower leg: 1+ Pitting Edema present.     Left lower leg: 1+ Pitting Edema present.     Comments: Occasional pins sensation on L shin  with associated edema   Skin:    General: Skin is warm and dry.     Capillary Refill: Capillary refill takes less than 2 seconds.     Coloration: Skin is not jaundiced or pale.     Findings: No bruising, erythema, lesion or rash.  Neurological:     General: No focal deficit present.     Mental Status: She is alert and oriented to person, place, and time. Mental status is at baseline.     Cranial Nerves: No cranial nerve deficit.     Sensory: No sensory deficit.     Motor: No weakness.     Coordination: Coordination normal.  Psychiatric:        Mood and Affect: Mood normal.        Behavior: Behavior normal.        Thought Content: Thought content normal.        Judgment: Judgment normal.      No results found for any visits on 08/11/21.  Assessment & Plan     Problem List Items Addressed This Visit       Cardiovascular and Mediastinum   Atrial fibrillation (HCC)    Chronic, stable Rate controlled heart beat of 53 Remains on BB, see HTN notes Remains on DOAC, Xarelto 20 mg; denies missed doses Followed by Dr Ubaldo Glassing, [facility]      Hypertension    Chronic, slightly elevated in office today; 145/62 Denies CP Denies SOB/ DOE Denies low blood pressure/hypotension Denies vision changes No LE Edema noted on exam Continue medication, propranolol 80 mg AM and 40 mg PM Denies side effects Please f/u with cardiology; referral placed Seek emergent care if you develop chest pain or chest pressure         Endocrine   Adult hypothyroidism    Weight gain finding of +15 lbs in 3 months; previously down 30 lbs in 3  months Repeat TSH and Free t4 Continues to take Synthroid 100 mcg daily         Nervous and Auditory   Neuropathy    Acute; worsening Complaints of pins/needles in LLE primarily; however, BLE are swollen in office Was seen by pain mgmt clinic and started on 100 mg of gabapentin TID; c/o excessive sleepiness with use Encouraged pt to contact team about scaling back use to qHS or BID if needed      Relevant Orders   Hemoglobin A1c     Other   Bilateral leg edema    Chronic, variable Encourage use of XL, med strength 15-20 mm Hg compression hose, increase water, decrease salt, and elevate Follow up with cardiology No sign of infection/cellulitis No concern for DVT at this time      Relevant Orders   Ambulatory referral to Cardiology   RESOLVED: Family history of diabetes mellitus   Relevant Orders   Hemoglobin A1c   Low HDL (under 40)    Chronic, stable I recommend diet low in saturated fat and regular exercise - 30 min at least 5 times per week Repeat FLP HLD is heart protective Can add fish oil to assist       Relevant Orders   Lipid panel   RESOLVED: Other disorders of iron metabolism   Relevant Orders   Hemoglobin A1c   Poor mobility - Primary    R/t chronic pain and obesity, use of cane to assist Encourage small, frequent movements encourage movement to assist with collateral blood flow development  Relevant Orders   Ambulatory referral to Physical Therapy   Prediabetes    Chronic, stable Repeat A1c due to weight variance  Continue to recommend balanced, lower carb meals. Smaller meal size, adding snacks. Choosing water as drink of choice and increasing purposeful exercise.       Relevant Orders   Hemoglobin A1c   Use of cane as ambulatory aid    Chronic, stable Denies falls Referral to PT to assist with strength building       Relevant Orders   Ambulatory referral to Physical Therapy   Weight gain finding    Acute on chronic in last 6  months -repeat A1c -Check CBC -Check Tsh/Free t4      Relevant Orders   CBC with Differential/Platelet   Comprehensive metabolic panel   T4, free   TSH     Return in about 6 weeks (around 09/22/2021) for chonic disease management.      Vonna Kotyk, FNP, have reviewed all documentation for this visit. The documentation on 08/11/21 for the exam, diagnosis, procedures, and orders are all accurate and complete.    [name], [place] [phone] (phone) [phone] (fax)  [place]

## 2021-08-11 ENCOUNTER — Encounter: Payer: Self-pay | Admitting: Family Medicine

## 2021-08-11 ENCOUNTER — Ambulatory Visit (INDEPENDENT_AMBULATORY_CARE_PROVIDER_SITE_OTHER): Payer: Medicare Other | Admitting: Family Medicine

## 2021-08-11 VITALS — BP 145/62 | HR 53 | Temp 97.7°F | Resp 18 | Wt 231.0 lb

## 2021-08-11 DIAGNOSIS — E039 Hypothyroidism, unspecified: Secondary | ICD-10-CM | POA: Diagnosis not present

## 2021-08-11 DIAGNOSIS — Z833 Family history of diabetes mellitus: Secondary | ICD-10-CM | POA: Diagnosis not present

## 2021-08-11 DIAGNOSIS — G629 Polyneuropathy, unspecified: Secondary | ICD-10-CM | POA: Diagnosis not present

## 2021-08-11 DIAGNOSIS — R635 Abnormal weight gain: Secondary | ICD-10-CM | POA: Insufficient documentation

## 2021-08-11 DIAGNOSIS — R600 Localized edema: Secondary | ICD-10-CM | POA: Diagnosis not present

## 2021-08-11 DIAGNOSIS — Z7409 Other reduced mobility: Secondary | ICD-10-CM | POA: Insufficient documentation

## 2021-08-11 DIAGNOSIS — R7303 Prediabetes: Secondary | ICD-10-CM | POA: Diagnosis not present

## 2021-08-11 DIAGNOSIS — I10 Essential (primary) hypertension: Secondary | ICD-10-CM | POA: Diagnosis not present

## 2021-08-11 DIAGNOSIS — E786 Lipoprotein deficiency: Secondary | ICD-10-CM | POA: Insufficient documentation

## 2021-08-11 DIAGNOSIS — I480 Paroxysmal atrial fibrillation: Secondary | ICD-10-CM

## 2021-08-11 DIAGNOSIS — Z9989 Dependence on other enabling machines and devices: Secondary | ICD-10-CM | POA: Diagnosis not present

## 2021-08-11 NOTE — Assessment & Plan Note (Signed)
Acute on chronic in last 6 months -repeat A1c -Check CBC -Check Tsh/Free t4

## 2021-08-11 NOTE — Assessment & Plan Note (Signed)
Weight gain finding of +15 lbs in 3 months; previously down 30 lbs in 3 months Repeat TSH and Free t4 Continues to take Synthroid 100 mcg daily

## 2021-08-11 NOTE — Assessment & Plan Note (Signed)
Chronic, stable Rate controlled heart beat of 53 Remains on BB, see HTN notes Remains on DOAC, Xarelto 20 mg; denies missed doses Followed by Dr Ubaldo Glassing, [facility]

## 2021-08-11 NOTE — Assessment & Plan Note (Signed)
Chronic, variable Encourage use of XL, med strength 15-20 mm Hg compression hose, increase water, decrease salt, and elevate Follow up with cardiology No sign of infection/cellulitis No concern for DVT at this time

## 2021-08-11 NOTE — Assessment & Plan Note (Signed)
Chronic, slightly elevated in office today; 145/62 Denies CP Denies SOB/ DOE Denies low blood pressure/hypotension Denies vision changes No LE Edema noted on exam Continue medication, propranolol 80 mg AM and 40 mg PM Denies side effects Please f/u with cardiology; referral placed Seek emergent care if you develop chest pain or chest pressure

## 2021-08-11 NOTE — Assessment & Plan Note (Signed)
Chronic, stable Denies falls Referral to PT to assist with strength building

## 2021-08-11 NOTE — Assessment & Plan Note (Signed)
Acute; worsening Complaints of pins/needles in LLE primarily; however, BLE are swollen in office Was seen by pain mgmt clinic and started on 100 mg of gabapentin TID; c/o excessive sleepiness with use Encouraged pt to contact team about scaling back use to qHS or BID if needed

## 2021-08-11 NOTE — Assessment & Plan Note (Signed)
Chronic, stable I recommend diet low in saturated fat and regular exercise - 30 min at least 5 times per week Repeat FLP HLD is heart protective Can add fish oil to assist

## 2021-08-11 NOTE — Assessment & Plan Note (Signed)
Chronic, stable Repeat A1c due to weight variance  Continue to recommend balanced, lower carb meals. Smaller meal size, adding snacks. Choosing water as drink of choice and increasing purposeful exercise.

## 2021-08-11 NOTE — Assessment & Plan Note (Signed)
R/t chronic pain and obesity, use of cane to assist Encourage small, frequent movements encourage movement to assist with collateral blood flow development

## 2021-08-12 ENCOUNTER — Other Ambulatory Visit: Payer: Self-pay | Admitting: Family Medicine

## 2021-08-12 LAB — CBC WITH DIFFERENTIAL/PLATELET
Basophils Absolute: 0.1 10*3/uL (ref 0.0–0.2)
Basos: 1 %
EOS (ABSOLUTE): 0.2 10*3/uL (ref 0.0–0.4)
Eos: 2 %
Hematocrit: 35.7 % (ref 34.0–46.6)
Hemoglobin: 11.3 g/dL (ref 11.1–15.9)
Immature Grans (Abs): 0 10*3/uL (ref 0.0–0.1)
Immature Granulocytes: 0 %
Lymphocytes Absolute: 2.4 10*3/uL (ref 0.7–3.1)
Lymphs: 31 %
MCH: 27.2 pg (ref 26.6–33.0)
MCHC: 31.7 g/dL (ref 31.5–35.7)
MCV: 86 fL (ref 79–97)
Monocytes Absolute: 0.7 10*3/uL (ref 0.1–0.9)
Monocytes: 9 %
Neutrophils Absolute: 4.5 10*3/uL (ref 1.4–7.0)
Neutrophils: 57 %
Platelets: 259 10*3/uL (ref 150–450)
RBC: 4.15 x10E6/uL (ref 3.77–5.28)
RDW: 14.9 % (ref 11.7–15.4)
WBC: 7.8 10*3/uL (ref 3.4–10.8)

## 2021-08-12 LAB — COMPREHENSIVE METABOLIC PANEL
ALT: 12 IU/L (ref 0–32)
AST: 20 IU/L (ref 0–40)
Albumin/Globulin Ratio: 1.3 (ref 1.2–2.2)
Albumin: 3.7 g/dL (ref 3.7–4.7)
Alkaline Phosphatase: 84 IU/L (ref 44–121)
BUN/Creatinine Ratio: 19 (ref 12–28)
BUN: 15 mg/dL (ref 8–27)
Bilirubin Total: 0.5 mg/dL (ref 0.0–1.2)
CO2: 23 mmol/L (ref 20–29)
Calcium: 9.2 mg/dL (ref 8.7–10.3)
Chloride: 103 mmol/L (ref 96–106)
Creatinine, Ser: 0.8 mg/dL (ref 0.57–1.00)
Globulin, Total: 2.9 g/dL (ref 1.5–4.5)
Glucose: 86 mg/dL (ref 70–99)
Potassium: 4.3 mmol/L (ref 3.5–5.2)
Sodium: 140 mmol/L (ref 134–144)
Total Protein: 6.6 g/dL (ref 6.0–8.5)
eGFR: 75 mL/min/{1.73_m2} (ref >59)

## 2021-08-12 LAB — LIPID PANEL
Chol/HDL Ratio: 4 ratio (ref 0.0–4.4)
Cholesterol, Total: 141 mg/dL (ref 100–199)
HDL: 35 mg/dL — ABNORMAL LOW (ref >39)
LDL Chol Calc (NIH): 88 mg/dL (ref 0–99)
Triglycerides: 93 mg/dL (ref 0–149)
VLDL Cholesterol Cal: 18 mg/dL (ref 5–40)

## 2021-08-12 LAB — TSH: TSH: 4.82 u[IU]/mL — ABNORMAL HIGH (ref 0.450–4.500)

## 2021-08-12 LAB — T4, FREE: Free T4: 1.12 ng/dL (ref 0.82–1.77)

## 2021-08-12 LAB — HEMOGLOBIN A1C
Est. average glucose Bld gHb Est-mCnc: 123 mg/dL
Hgb A1c MFr Bld: 5.9 % — ABNORMAL HIGH (ref 4.8–5.6)

## 2021-08-12 MED ORDER — LEVOTHYROXINE SODIUM 112 MCG PO TABS: 112.0000 ug | ORAL_TABLET | Freq: Every day | ORAL | 0 refills | Status: DC

## 2021-08-12 NOTE — Progress Notes (Signed)
Good to see you and your daughter yesterday.  Cholesterol is stable. Good cholesterol/HDL remains low. Recommend increase in diet of healthier fat choices- low fat meats, oils that are not solid at room temperature, nuts, seeds, fish- cod, halibut, salmon, and avocado. Exercise can also increase this number.  Supplemental omega 3's can be taken as well but are not as helpful as dietary/exercise changes.  Risk of heart attack/stroke is elevated due to blood pressure, HDL, and age. I recommend diet low in saturated fat and regular exercise - 30 min at least 5 times per week  The 10-year ASCVD risk score (Arnett DK, et al., 2019) is: 36.3%   Values used to calculate the score:     Age: 81 years     Sex: Female     Is Non-Hispanic African American: No     Diabetic: No     Tobacco smoker: No     Systolic Blood Pressure: 654 mmHg     Is BP treated: Yes     HDL Cholesterol: 35 mg/dL     Total Cholesterol: 141 mg/dL  Thyroid is slightly abnormal; recommend increase of your thyroid pill to 112 mcg with repeat labs within 3 months. Take only with water, without other medications. 30 minutes prior to eating/drinking/taking other pills.   Pre diabetes remains. Has been stable for the last 2 years which is reassuring. Continue to recommend balanced, lower carb meals. Smaller meal size, adding snacks. Choosing water as drink of choice and increasing purposeful exercise.  Cell count stable, Blood chemistry stable.  Referrals as we discussed back to Dr Ubaldo Glassing to expedite process as well to PT to assist with mobility and possible use of TENS unit. Please speak with pain team about YOUR goals.  It was a pleasure to see you in the office the other day.  Please let us know if you have any questions.  Thank you, Glenda Williams, [address] [place], [place] [address] [phone] (phone) [phone] (fax) [place]

## 2021-08-16 NOTE — Progress Notes (Deleted)
08/17/2021 1:08 PM   Glenda Williams 12-Jan-1941 [id number]  Referring provider: Virginia Crews, [address] [place],  [place] [address]  Urological history: 1. Urge incontinence -contributing factors of age, vaginal atrophy, obesity, vaginal delivery, a family history of incontinence, caffeine, depression, antihistamines, decongestants, ACE inhibitors, antiarrhythmics, diuretics and antidepressants -PVR *** -managed with incontinence pads   2. rUTI's -contributing factors of age, vaginal atrophy and constipation -documented positive urine culture over the last year  10/07/2020 Proteus mirabilis  11/11/2020 E.coli   12/28/2020 E.coli  01/20/2021 E.coli  3. High risk hematuria -non-smoker -CT urogram 02/2021 - NED -cysto 01/2021 - NED -urine cytology 01/2021 - negative -no reports of gross heme -UA ***  4. Renal cyst -CTU 2022 - Subcentimeter left interpolar renal cyst  No chief complaint on file.    HPI: Glenda Williams is a 81 y.o. female who presents today for 6 month follow up.   UA ***  PMH: Past Medical History:  Diagnosis Date   A-fib ([place])    Arthritis    Bell palsy 09/04/2014   GERD (gastroesophageal reflux disease)    Heart disease    Rheumatoid arthritis ([place])    Thyroid disease    Tremors of nervous system     Surgical History: Past Surgical History:  Procedure Laterality Date   BREAST BIOPSY Left 1988   Benign   BREAST BIOPSY Left 1987   Benign   BREAST EXCISIONAL BIOPSY     BROW LIFT Bilateral 05/23/2016   Procedure: BLEPHAROPLASTY upper eyelid; w/excess skin;  Surgeon: Karle Starch, MD;  Location: [facility];  Service: Ophthalmology;  Laterality: Bilateral;   CARPAL TUNNEL RELEASE Bilateral    CHOLECYSTECTOMY     Dr. Bary Castilla   COLONOSCOPY  2006   JOINT REPLACEMENT Bilateral    knees   REPLACEMENT TOTAL KNEE Left 2005   REPLACEMENT TOTAL KNEE Right 1998    Home Medications:  Allergies as of 08/17/2021        Reactions   Bactrim [sulfamethoxazole-trimethoprim] Anaphylaxis   Macrobid [nitrofurantoin Macrocrystal] Rash   Mirabegron Rash   Oxybutynin Rash   Penicillins Rash   Has patient had a PCN reaction causing immediate rash, facial/tongue/throat swelling, SOB or lightheadedness with hypotension: Yes Has patient had a PCN reaction causing severe rash involving mucus membranes or skin necrosis: No Has patient had a PCN reaction that required hospitalization: No Has patient had a PCN reaction occurring within the last 10 years: Unknown If all of the above answers are "NO", then may proceed with Cephalosporin use.        Medication List        Accurate as of August 16, 2021  1:08 PM. If you have any questions, ask your nurse or doctor.          albuterol 108 (90 Base) MCG/ACT inhaler Commonly known as: VENTOLIN HFA Inhale 2 puffs into the lungs every 6 (six) hours as needed for wheezing or shortness of breath.   cetirizine 10 MG tablet Commonly known as: ZYRTEC Take 10 mg by mouth daily as needed.   cholecalciferol 10 MCG (400 UNIT) Tabs tablet Commonly known as: VITAMIN D3 Take 400 Units by mouth daily.   EPINEPHrine 0.3 mg/0.3 mL Soaj injection Commonly known as: EPI-PEN Inject 0.3 mLs into the skin once as needed for anaphylaxis.   estradiol 0.1 MG/GM vaginal cream Commonly known as: ESTRACE Apply 0.5 g to the vaginal introitus on Monday, Wednesday and  Friday nights   Fish Oil 1000 MG Caps Take 1 capsule by mouth daily.   folic acid 1 MG tablet Commonly known as: FOLVITE Take 1 mg by mouth daily.   gabapentin 100 MG capsule Commonly known as: NEURONTIN Take 100 mg by mouth 3 (three) times daily.   hydrocortisone cream 1 % Apply 1 application topically as needed for itching.   ketoconazole 2 % cream Commonly known as: NIZORAL Apply 1 application topically daily.   levothyroxine 112 MCG tablet Commonly known as: SYNTHROID Take 1 tablet (112 mcg total)  by mouth daily.   methotrexate 2.5 MG tablet Commonly known as: RHEUMATREX Take 20 mg by mouth every Thursday.   nystatin powder Generic drug: nystatin APPLY TOPICALLY TO SKIN FOLDS THREE TIMES DAILY   omeprazole 20 MG capsule Commonly known as: PRILOSEC TAKE 1 CAPSULE BY MOUTH  TWICE DAILY BEFORE MEALS. Please schedule office visit before any future refill.   potassium chloride SA 20 MEQ tablet Commonly known as: KLOR-CON M Take 1 tablet by mouth  daily   Premarin vaginal cream Generic drug: conjugated estrogens Apply 0.'5mg'$  (pea-sized amount)  just inside the vaginal introitus with a finger-tip on  Monday, Wednesday and Friday nights.   propranolol 40 MG tablet Commonly known as: INDERAL 80 mg in the am and 40 mg at night   Red Yeast Rice 600 MG Caps Take 1 capsule by mouth daily.   rivaroxaban 20 MG Tabs tablet Commonly known as: XARELTO TAKE 1 TABLET BY MOUTH ONCE DAILY   sertraline 50 MG tablet Commonly known as: ZOLOFT TAKE 3 TABLETS BY MOUTH  DAILY        Allergies:  Allergies  Allergen Reactions   Bactrim [Sulfamethoxazole-Trimethoprim] Anaphylaxis   Macrobid [Nitrofurantoin Macrocrystal] Rash   Mirabegron Rash   Oxybutynin Rash   Penicillins Rash    Has patient had a PCN reaction causing immediate rash, facial/tongue/throat swelling, SOB or lightheadedness with hypotension: Yes Has patient had a PCN reaction causing severe rash involving mucus membranes or skin necrosis: No Has patient had a PCN reaction that required hospitalization: No Has patient had a PCN reaction occurring within the last 10 years: Unknown If all of the above answers are "NO", then may proceed with Cephalosporin use.     Family History: Family History  Problem Relation Age of Onset   Stroke Mother    Hypertension Mother    Heart disease Father    Arthritis Father    Parkinson's disease Sister    Parkinson's disease Brother    Bladder Cancer Brother    Breast cancer Neg  Hx    Kidney cancer Neg Hx     Social History:  reports that she has never smoked. She has never used smokeless tobacco. She reports that she does not drink alcohol and does not use drugs.  ROS: Pertinent ROS in HPI  Physical Exam: There were no vitals taken for this visit.  Constitutional:  Well nourished. Alert and oriented, No acute distress. HEENT: [place] AT, moist mucus membranes.  Trachea midline, no masses. Cardiovascular: No clubbing, cyanosis, or edema. Respiratory: Normal respiratory effort, no increased work of breathing. GI: Abdomen is soft, non tender, non distended, no abdominal masses. Liver and spleen not palpable.  No hernias appreciated.  Stool sample for occult testing is not indicated.   GU: No CVA tenderness.  No bladder fullness or masses.  *** external genitalia, *** pubic hair distribution, no lesions.  Normal urethral meatus, no lesions, no prolapse, no  discharge.   No urethral masses, tenderness and/or tenderness. No bladder fullness, tenderness or masses. *** vagina mucosa, *** estrogen effect, no discharge, no lesions, *** pelvic support, *** cystocele and *** rectocele noted.  No cervical motion tenderness.  Uterus is freely mobile and non-fixed.  No adnexal/parametria masses or tenderness noted.  Anus and perineum are without rashes or lesions.   ***  Skin: No rashes, bruises or suspicious lesions. Lymph: No cervical or inguinal adenopathy. Neurologic: Grossly intact, no focal deficits, moving all 4 extremities. Psychiatric: Normal mood and affect.    Laboratory Data: Component     Latest Ref Rng 08/11/2021  Hemoglobin A1C     4.8 - 5.6 % 5.9 (H)   Est. average glucose Bld gHb Est-mCnc     mg/dL 123        Latest Ref Rng & Units 08/11/2021   10:58 AM 02/07/2021    9:12 AM 12/07/2020    9:52 AM  CMP  Glucose 70 - 99 mg/dL 86   87   BUN 8 - 27 mg/dL 15   12   Creatinine 0.57 - 1.00 mg/dL 0.80  0.70  0.72   Sodium 134 - 144 mmol/L 140   139   Potassium 3.5 -  5.2 mmol/L 4.3   4.3   Chloride 96 - 106 mmol/L 103   102   CO2 20 - 29 mmol/L 23   24   Calcium 8.7 - 10.3 mg/dL 9.2   9.4   Total Protein 6.0 - 8.5 g/dL 6.6   6.9   Total Bilirubin 0.0 - 1.2 mg/dL 0.5   0.7   Alkaline Phos 44 - 121 IU/L 84   79   AST 0 - 40 IU/L 20   19   ALT 0 - 32 IU/L 12   10        Latest Ref Rng & Units 08/11/2021   10:58 AM 05/17/2020   12:00 AM 05/07/2019    9:43 AM  CBC  WBC 3.4 - 10.8 x10E3/uL 7.8  9.4     7.0   Hemoglobin 11.1 - 15.9 g/dL 11.3  12.4     13.5   Hematocrit 34.0 - 46.6 % 35.7  40     41.4   Platelets 150 - 450 x10E3/uL 259  293     269      This result is from an external source.    Pertinent Imaging: N/A    Assessment & Plan:    1. High risk hematuria -Reviewed CT findings with patient -work up 2022 - NED -no reports of gross heme  2. rUTI's -continue preventative strategies  3. Vaginal atrophy -prescription vaginal estrogen cream cost prohibitive      -I have called in the compounded formulation to [facility] for her                                       No follow-ups on file.  These notes generated with voice recognition software. I apologize for typographical errors.  Zara Council, PA-C  [facility] [address]  [place], [place] [address] [phone]

## 2021-08-17 ENCOUNTER — Ambulatory Visit: Payer: Medicare Other | Admitting: Urology

## 2021-08-17 ENCOUNTER — Telehealth: Payer: Self-pay | Admitting: Urology

## 2021-08-23 ENCOUNTER — Ambulatory Visit (INDEPENDENT_AMBULATORY_CARE_PROVIDER_SITE_OTHER): Payer: Medicare Other | Admitting: Family Medicine

## 2021-08-23 ENCOUNTER — Encounter: Payer: Self-pay | Admitting: Family Medicine

## 2021-08-23 VITALS — BP 130/80 | HR 85 | Temp 98.3°F | Resp 16

## 2021-08-23 DIAGNOSIS — E039 Hypothyroidism, unspecified: Secondary | ICD-10-CM | POA: Diagnosis not present

## 2021-08-23 DIAGNOSIS — I480 Paroxysmal atrial fibrillation: Secondary | ICD-10-CM | POA: Diagnosis not present

## 2021-08-23 DIAGNOSIS — K219 Gastro-esophageal reflux disease without esophagitis: Secondary | ICD-10-CM

## 2021-08-23 DIAGNOSIS — Z7901 Long term (current) use of anticoagulants: Secondary | ICD-10-CM | POA: Diagnosis not present

## 2021-08-23 DIAGNOSIS — R600 Localized edema: Secondary | ICD-10-CM

## 2021-08-23 DIAGNOSIS — I10 Essential (primary) hypertension: Secondary | ICD-10-CM | POA: Diagnosis not present

## 2021-08-23 DIAGNOSIS — I38 Endocarditis, valve unspecified: Secondary | ICD-10-CM | POA: Diagnosis not present

## 2021-08-23 MED ORDER — ALBUTEROL SULFATE HFA 108 (90 BASE) MCG/ACT IN AERS: 2.0000 | INHALATION_SPRAY | Freq: Four times a day (QID) | RESPIRATORY_TRACT | 2 refills | Status: DC | PRN

## 2021-08-23 MED ORDER — LEVOTHYROXINE SODIUM 112 MCG PO TABS: 112.0000 ug | ORAL_TABLET | Freq: Every day | ORAL | 1 refills | Status: DC

## 2021-08-23 MED ORDER — OMEPRAZOLE 20 MG PO CPDR: DELAYED_RELEASE_CAPSULE | ORAL | 3 refills | Status: DC

## 2021-08-23 NOTE — Assessment & Plan Note (Signed)
Chronic and well controlled  Continue PPI 

## 2021-08-23 NOTE — Assessment & Plan Note (Signed)
Discussed importance of healthy weight management Discussed diet and exercise  

## 2021-08-23 NOTE — Assessment & Plan Note (Signed)
Was seen for this 2 weeks ago and has appt with cardiology this afternoon Labs benign She was advised to get compression socks, but due to arthritis, has difficulty putting these on Advised about compression socks that zip tight - they will look into getting these.

## 2021-08-23 NOTE — Progress Notes (Signed)
I,Joseline E Rosas,acting as a scribe for Lavon Paganini, MD.,have documented all relevant documentation on the behalf of Lavon Paganini, MD,as directed by  Lavon Paganini, MD while in the presence of Lavon Paganini, MD.   Established patient visit   Patient: Glenda Williams   DOB: 1940-04-04   80 y.o. Female  MRN: [id number] Visit Date: 08/23/2021  Today's healthcare provider: Lavon Paganini, MD   Chief Complaint  Patient presents with   Follow-up   Subjective    HPI  Patient here to have a refill on Omeprazole. Reports that she is doing well with the medication.  Medications: Outpatient Medications Prior to Visit  Medication Sig   cetirizine (ZYRTEC) 10 MG tablet Take 10 mg by mouth daily as needed.    cholecalciferol (VITAMIN D) 400 units TABS tablet Take 400 Units by mouth daily.   conjugated estrogens (PREMARIN) vaginal cream Apply 0.'5mg'$  (pea-sized amount)  just inside the vaginal introitus with a finger-tip on  Monday, Wednesday and Friday nights.   EPINEPHrine 0.3 mg/0.3 mL IJ SOAJ injection Inject 0.3 mLs into the skin once as needed for anaphylaxis.   estradiol (ESTRACE) 0.1 MG/GM vaginal cream Apply 0.5 g to the vaginal introitus on Monday, Wednesday and Friday nights   folic acid (FOLVITE) 1 MG tablet Take 1 mg by mouth daily.   gabapentin (NEURONTIN) 100 MG capsule Take 100 mg by mouth 3 (three) times daily. Per patient taking BID   hydrocortisone cream 1 % Apply 1 application topically as needed for itching.   ketoconazole (NIZORAL) 2 % cream Apply 1 application topically daily.   methotrexate (RHEUMATREX) 2.5 MG tablet Take 20 mg by mouth every Thursday.    NYSTATIN powder APPLY TOPICALLY TO SKIN FOLDS THREE TIMES DAILY   Omega-3 Fatty Acids (FISH OIL) 1000 MG CAPS Take 1 capsule by mouth daily.   potassium chloride SA (K-DUR,KLOR-CON) 20 MEQ tablet Take 1 tablet by mouth  daily   propranolol (INDERAL) 40 MG tablet 80 mg in the am and 40 mg at  night   Red Yeast Rice 600 MG CAPS Take 1 capsule by mouth daily.   rivaroxaban (XARELTO) 20 MG TABS tablet Take 20 mg by mouth daily with supper.   sertraline (ZOLOFT) 50 MG tablet TAKE 3 TABLETS BY MOUTH  DAILY   [DISCONTINUED] levothyroxine (SYNTHROID) 112 MCG tablet Take 1 tablet (112 mcg total) by mouth daily.   [DISCONTINUED] omeprazole (PRILOSEC) 20 MG capsule TAKE 1 CAPSULE BY MOUTH  TWICE DAILY BEFORE MEALS. Please schedule office visit before any future refill.   [DISCONTINUED] albuterol (VENTOLIN HFA) 108 (90 Base) MCG/ACT inhaler Inhale 2 puffs into the lungs every 6 (six) hours as needed for wheezing or shortness of breath. (Patient not taking: Reported on 08/23/2021)   No facility-administered medications prior to visit.    Review of Systems per HPI     Objective    BP 130/80 (BP Location: Left Arm, Patient Position: Sitting, Cuff Size: Large)   Pulse 85   Temp 98.3 F (36.8 C) (Oral)   Resp 16   SpO2 98%    Physical Exam Vitals reviewed.  Constitutional:      General: She is not in acute distress.    Appearance: Normal appearance. She is well-developed. She is not diaphoretic.  HENT:     Head: Normocephalic and atraumatic.  Eyes:     General: No scleral icterus.    Conjunctiva/sclera: Conjunctivae normal.  Neck:     Thyroid: No  thyromegaly.  Cardiovascular:     Rate and Rhythm: Normal rate and regular rhythm.     Heart sounds: Murmur heard.  Pulmonary:     Effort: Pulmonary effort is normal. No respiratory distress.     Breath sounds: Normal breath sounds. No wheezing, rhonchi or rales.  Musculoskeletal:     Cervical back: Neck supple.     Right lower leg: Edema present.     Left lower leg: Edema present.  Lymphadenopathy:     Cervical: No cervical adenopathy.  Skin:    General: Skin is warm and dry.  Neurological:     Mental Status: She is alert and oriented to person, place, and time. Mental status is at baseline.  Psychiatric:        Mood and  Affect: Mood normal.        Behavior: Behavior normal.       No results found for any visits on 08/23/21.  Assessment & Plan     Problem List Items Addressed This Visit       Digestive   Gastroesophageal reflux disease without esophagitis    Chronic and well controlled  Continue PPI       Relevant Medications   omeprazole (PRILOSEC) 20 MG capsule     Endocrine   Adult hypothyroidism - Primary    Recent TSH elevated and synthroid dose increased to 112 mcg daily  She has not gotten the new medicaiton from the pharmacy - reRx'd today Recheck TSH in 2 months      Relevant Medications   levothyroxine (SYNTHROID) 112 MCG tablet   Other Relevant Orders   TSH     Other   Morbid obesity ([place])    Discussed importance of healthy weight management Discussed diet and exercise       Bilateral leg edema    Was seen for this 2 weeks ago and has appt with cardiology this afternoon Labs benign She was advised to get compression socks, but due to arthritis, has difficulty putting these on Advised about compression socks that zip tight - they will look into getting these.        Return in about 6 months (around 02/22/2022) for AWV, chronic disease f/u.      I, Lavon Paganini, MD, have reviewed all documentation for this visit. The documentation on 08/23/21 for the exam, diagnosis, procedures, and orders are all accurate and complete.   Allexa Acoff, Dionne Bucy, MD, MPH [facility]

## 2021-08-23 NOTE — Assessment & Plan Note (Signed)
Recent TSH elevated and synthroid dose increased to 112 mcg daily  She has not gotten the new medicaiton from the pharmacy - reRx'd today Recheck TSH in 2 months

## 2021-08-24 ENCOUNTER — Other Ambulatory Visit: Payer: Self-pay

## 2021-08-24 MED ORDER — ALBUTEROL SULFATE HFA 108 (90 BASE) MCG/ACT IN AERS: 2.0000 | INHALATION_SPRAY | Freq: Four times a day (QID) | RESPIRATORY_TRACT | 2 refills | Status: DC | PRN

## 2021-09-08 DIAGNOSIS — M0609 Rheumatoid arthritis without rheumatoid factor, multiple sites: Secondary | ICD-10-CM | POA: Diagnosis not present

## 2021-10-12 DIAGNOSIS — M25511 Pain in right shoulder: Secondary | ICD-10-CM | POA: Diagnosis not present

## 2021-10-12 DIAGNOSIS — G8929 Other chronic pain: Secondary | ICD-10-CM | POA: Diagnosis not present

## 2021-10-12 DIAGNOSIS — M48062 Spinal stenosis, lumbar region with neurogenic claudication: Secondary | ICD-10-CM | POA: Diagnosis not present

## 2021-10-12 DIAGNOSIS — M5442 Lumbago with sciatica, left side: Secondary | ICD-10-CM | POA: Diagnosis not present

## 2021-10-12 DIAGNOSIS — M25512 Pain in left shoulder: Secondary | ICD-10-CM | POA: Diagnosis not present

## 2021-10-12 DIAGNOSIS — M25552 Pain in left hip: Secondary | ICD-10-CM | POA: Diagnosis not present

## 2021-10-19 NOTE — Telephone Encounter (Signed)
Error

## 2021-10-21 DIAGNOSIS — M48062 Spinal stenosis, lumbar region with neurogenic claudication: Secondary | ICD-10-CM | POA: Diagnosis not present

## 2021-10-23 ENCOUNTER — Emergency Department: Payer: Medicare Other

## 2021-10-23 ENCOUNTER — Other Ambulatory Visit: Payer: Self-pay

## 2021-10-23 ENCOUNTER — Encounter: Payer: Self-pay | Admitting: Emergency Medicine

## 2021-10-23 ENCOUNTER — Inpatient Hospital Stay
Admission: EM | Admit: 2021-10-23 | Discharge: 2021-10-29 | DRG: 605 | Disposition: A | Discharge status: Home Health | Payer: Medicare Other | Attending: Internal Medicine | Admitting: Internal Medicine

## 2021-10-23 DIAGNOSIS — F32A Depression, unspecified: Secondary | ICD-10-CM | POA: Diagnosis present

## 2021-10-23 DIAGNOSIS — R7303 Prediabetes: Secondary | ICD-10-CM | POA: Diagnosis present

## 2021-10-23 DIAGNOSIS — S22000A Wedge compression fracture of unspecified thoracic vertebra, initial encounter for closed fracture: Secondary | ICD-10-CM | POA: Diagnosis not present

## 2021-10-23 DIAGNOSIS — Z20822 Contact with and (suspected) exposure to covid-19: Secondary | ICD-10-CM | POA: Diagnosis present

## 2021-10-23 DIAGNOSIS — I5032 Chronic diastolic (congestive) heart failure: Secondary | ICD-10-CM | POA: Diagnosis present

## 2021-10-23 DIAGNOSIS — I724 Aneurysm of artery of lower extremity: Secondary | ICD-10-CM | POA: Diagnosis present

## 2021-10-23 DIAGNOSIS — Z79899 Other long term (current) drug therapy: Secondary | ICD-10-CM

## 2021-10-23 DIAGNOSIS — E876 Hypokalemia: Secondary | ICD-10-CM | POA: Diagnosis not present

## 2021-10-23 DIAGNOSIS — I480 Paroxysmal atrial fibrillation: Secondary | ICD-10-CM | POA: Diagnosis not present

## 2021-10-23 DIAGNOSIS — Z8249 Family history of ischemic heart disease and other diseases of the circulatory system: Secondary | ICD-10-CM

## 2021-10-23 DIAGNOSIS — S8011XA Contusion of right lower leg, initial encounter: Secondary | ICD-10-CM | POA: Diagnosis not present

## 2021-10-23 DIAGNOSIS — Z888 Allergy status to other drugs, medicaments and biological substances status: Secondary | ICD-10-CM | POA: Diagnosis not present

## 2021-10-23 DIAGNOSIS — I10 Essential (primary) hypertension: Secondary | ICD-10-CM | POA: Diagnosis present

## 2021-10-23 DIAGNOSIS — F329 Major depressive disorder, single episode, unspecified: Secondary | ICD-10-CM | POA: Diagnosis present

## 2021-10-23 DIAGNOSIS — Y92009 Unspecified place in unspecified non-institutional (private) residence as the place of occurrence of the external cause: Secondary | ICD-10-CM

## 2021-10-23 DIAGNOSIS — Z0389 Encounter for observation for other suspected diseases and conditions ruled out: Secondary | ICD-10-CM | POA: Diagnosis not present

## 2021-10-23 DIAGNOSIS — E039 Hypothyroidism, unspecified: Secondary | ICD-10-CM | POA: Diagnosis present

## 2021-10-23 DIAGNOSIS — G250 Essential tremor: Secondary | ICD-10-CM | POA: Diagnosis present

## 2021-10-23 DIAGNOSIS — Z7901 Long term (current) use of anticoagulants: Secondary | ICD-10-CM

## 2021-10-23 DIAGNOSIS — S0990XA Unspecified injury of head, initial encounter: Secondary | ICD-10-CM | POA: Diagnosis not present

## 2021-10-23 DIAGNOSIS — I700 Atherosclerosis of aorta: Secondary | ICD-10-CM | POA: Diagnosis present

## 2021-10-23 DIAGNOSIS — I4891 Unspecified atrial fibrillation: Secondary | ICD-10-CM | POA: Diagnosis present

## 2021-10-23 DIAGNOSIS — W06XXXA Fall from bed, initial encounter: Secondary | ICD-10-CM | POA: Diagnosis present

## 2021-10-23 DIAGNOSIS — D62 Acute posthemorrhagic anemia: Secondary | ICD-10-CM | POA: Diagnosis not present

## 2021-10-23 DIAGNOSIS — Z79890 Hormone replacement therapy (postmenopausal): Secondary | ICD-10-CM

## 2021-10-23 DIAGNOSIS — G8929 Other chronic pain: Secondary | ICD-10-CM | POA: Diagnosis present

## 2021-10-23 DIAGNOSIS — J189 Pneumonia, unspecified organism: Secondary | ICD-10-CM | POA: Diagnosis not present

## 2021-10-23 DIAGNOSIS — I2510 Atherosclerotic heart disease of native coronary artery without angina pectoris: Secondary | ICD-10-CM | POA: Diagnosis present

## 2021-10-23 DIAGNOSIS — R9431 Abnormal electrocardiogram [ECG] [EKG]: Secondary | ICD-10-CM | POA: Diagnosis not present

## 2021-10-23 DIAGNOSIS — Z8052 Family history of malignant neoplasm of bladder: Secondary | ICD-10-CM

## 2021-10-23 DIAGNOSIS — I110 Hypertensive heart disease with heart failure: Secondary | ICD-10-CM | POA: Diagnosis present

## 2021-10-23 DIAGNOSIS — G510 Bell's palsy: Secondary | ICD-10-CM | POA: Diagnosis present

## 2021-10-23 DIAGNOSIS — Z820 Family history of epilepsy and other diseases of the nervous system: Secondary | ICD-10-CM

## 2021-10-23 DIAGNOSIS — R079 Chest pain, unspecified: Secondary | ICD-10-CM | POA: Diagnosis not present

## 2021-10-23 DIAGNOSIS — Z6841 Body Mass Index (BMI) 40.0 and over, adult: Secondary | ICD-10-CM | POA: Diagnosis not present

## 2021-10-23 DIAGNOSIS — M79604 Pain in right leg: Secondary | ICD-10-CM | POA: Diagnosis not present

## 2021-10-23 DIAGNOSIS — E669 Obesity, unspecified: Secondary | ICD-10-CM | POA: Diagnosis present

## 2021-10-23 DIAGNOSIS — Z79631 Long term (current) use of antimetabolite agent: Secondary | ICD-10-CM

## 2021-10-23 DIAGNOSIS — F419 Anxiety disorder, unspecified: Secondary | ICD-10-CM | POA: Diagnosis present

## 2021-10-23 DIAGNOSIS — M4854XA Collapsed vertebra, not elsewhere classified, thoracic region, initial encounter for fracture: Secondary | ICD-10-CM | POA: Diagnosis present

## 2021-10-23 DIAGNOSIS — Z96653 Presence of artificial knee joint, bilateral: Secondary | ICD-10-CM | POA: Diagnosis present

## 2021-10-23 DIAGNOSIS — F418 Other specified anxiety disorders: Secondary | ICD-10-CM | POA: Diagnosis present

## 2021-10-23 DIAGNOSIS — M069 Rheumatoid arthritis, unspecified: Secondary | ICD-10-CM | POA: Diagnosis present

## 2021-10-23 DIAGNOSIS — Z880 Allergy status to penicillin: Secondary | ICD-10-CM

## 2021-10-23 DIAGNOSIS — E785 Hyperlipidemia, unspecified: Secondary | ICD-10-CM | POA: Diagnosis present

## 2021-10-23 DIAGNOSIS — M25551 Pain in right hip: Secondary | ICD-10-CM | POA: Diagnosis not present

## 2021-10-23 DIAGNOSIS — S199XXA Unspecified injury of neck, initial encounter: Secondary | ICD-10-CM | POA: Diagnosis not present

## 2021-10-23 DIAGNOSIS — T148XXA Other injury of unspecified body region, initial encounter: Secondary | ICD-10-CM | POA: Diagnosis present

## 2021-10-23 DIAGNOSIS — W19XXXA Unspecified fall, initial encounter: Principal | ICD-10-CM

## 2021-10-23 DIAGNOSIS — S7011XA Contusion of right thigh, initial encounter: Principal | ICD-10-CM | POA: Diagnosis present

## 2021-10-23 DIAGNOSIS — Z823 Family history of stroke: Secondary | ICD-10-CM

## 2021-10-23 DIAGNOSIS — K219 Gastro-esophageal reflux disease without esophagitis: Secondary | ICD-10-CM | POA: Diagnosis present

## 2021-10-23 LAB — BASIC METABOLIC PANEL
Anion gap: 9 (ref 5–15)
BUN: 19 mg/dL (ref 8–23)
CO2: 29 mmol/L (ref 22–32)
Calcium: 8.9 mg/dL (ref 8.9–10.3)
Chloride: 100 mmol/L (ref 98–111)
Creatinine, Ser: 0.82 mg/dL (ref 0.44–1.00)
GFR, Estimated: >60 mL/min (ref >60)
Glucose, Bld: 111 mg/dL — ABNORMAL HIGH (ref 70–99)
Potassium: 2.8 mmol/L — ABNORMAL LOW (ref 3.5–5.1)
Sodium: 138 mmol/L (ref 135–145)

## 2021-10-23 LAB — URINALYSIS, ROUTINE W REFLEX MICROSCOPIC
Bilirubin Urine: NEGATIVE
Glucose, UA: NEGATIVE mg/dL
Ketones, ur: NEGATIVE mg/dL
Nitrite: NEGATIVE
Protein, ur: NEGATIVE mg/dL
Specific Gravity, Urine: 1.026 (ref 1.005–1.030)
pH: 5 (ref 5.0–8.0)

## 2021-10-23 LAB — APTT: aPTT: 38 s — ABNORMAL HIGH (ref 24–36)

## 2021-10-23 LAB — CBC WITH DIFFERENTIAL/PLATELET
Abs Immature Granulocytes: 0.12 10*3/uL — ABNORMAL HIGH (ref 0.00–0.07)
Basophils Absolute: 0.1 10*3/uL (ref 0.0–0.1)
Basophils Relative: 1 %
Eosinophils Absolute: 0.2 10*3/uL (ref 0.0–0.5)
Eosinophils Relative: 2 %
HCT: 34.3 % — ABNORMAL LOW (ref 36.0–46.0)
Hemoglobin: 10.7 g/dL — ABNORMAL LOW (ref 12.0–15.0)
Immature Granulocytes: 1 %
Lymphocytes Relative: 17 %
Lymphs Abs: 1.7 10*3/uL (ref 0.7–4.0)
MCH: 26.9 pg (ref 26.0–34.0)
MCHC: 31.2 g/dL (ref 30.0–36.0)
MCV: 86.2 fL (ref 80.0–100.0)
Monocytes Absolute: 0.7 10*3/uL (ref 0.1–1.0)
Monocytes Relative: 6 %
Neutro Abs: 7.6 10*3/uL (ref 1.7–7.7)
Neutrophils Relative %: 73 %
Platelets: 311 10*3/uL (ref 150–400)
RBC: 3.98 MIL/uL (ref 3.87–5.11)
RDW: 15.1 % (ref 11.5–15.5)
WBC: 10.4 10*3/uL (ref 4.0–10.5)
nRBC: 0 % (ref 0.0–0.2)

## 2021-10-23 LAB — CBC
HCT: 28.3 % — ABNORMAL LOW (ref 36.0–46.0)
HCT: 27.9 % — ABNORMAL LOW (ref 36.0–46.0)
Hemoglobin: 8.7 g/dL — ABNORMAL LOW (ref 12.0–15.0)
Hemoglobin: 8.5 g/dL — ABNORMAL LOW (ref 12.0–15.0)
MCH: 26.7 pg (ref 26.0–34.0)
MCH: 26.6 pg (ref 26.0–34.0)
MCHC: 30.7 g/dL (ref 30.0–36.0)
MCHC: 30.5 g/dL (ref 30.0–36.0)
MCV: 86.8 fL (ref 80.0–100.0)
MCV: 87.5 fL (ref 80.0–100.0)
Platelets: 275 10*3/uL (ref 150–400)
Platelets: 267 10*3/uL (ref 150–400)
RBC: 3.26 MIL/uL — ABNORMAL LOW (ref 3.87–5.11)
RBC: 3.19 MIL/uL — ABNORMAL LOW (ref 3.87–5.11)
RDW: 15.1 % (ref 11.5–15.5)
RDW: 15.1 % (ref 11.5–15.5)
WBC: 10.8 10*3/uL — ABNORMAL HIGH (ref 4.0–10.5)
WBC: 10 10*3/uL (ref 4.0–10.5)
nRBC: 0 % (ref 0.0–0.2)
nRBC: 0 % (ref 0.0–0.2)

## 2021-10-23 LAB — PROTIME-INR
INR: 2.4 — ABNORMAL HIGH (ref 0.8–1.2)
Prothrombin Time: 26.3 seconds — ABNORMAL HIGH (ref 11.4–15.2)

## 2021-10-23 LAB — SARS CORONAVIRUS 2 BY RT PCR: SARS Coronavirus 2 by RT PCR: NEGATIVE

## 2021-10-23 LAB — PROCALCITONIN: Procalcitonin: <0.1 ng/mL

## 2021-10-23 LAB — TROPONIN I (HIGH SENSITIVITY): Troponin I (High Sensitivity): 14 ng/L (ref <18)

## 2021-10-23 LAB — SAMPLE TO BLOOD BANK

## 2021-10-23 LAB — MAGNESIUM: Magnesium: 1.7 mg/dL (ref 1.7–2.4)

## 2021-10-23 LAB — BRAIN NATRIURETIC PEPTIDE: B Natriuretic Peptide: 137.1 pg/mL — ABNORMAL HIGH (ref 0.0–100.0)

## 2021-10-23 MED ORDER — SODIUM CHLORIDE 0.9 % IV SOLN: INTRAVENOUS | Status: DC

## 2021-10-23 MED ORDER — DM-GUAIFENESIN ER 30-600 MG PO TB12: 1.0000 | ORAL_TABLET | Freq: Two times a day (BID) | ORAL | Status: DC | PRN

## 2021-10-23 MED ORDER — POTASSIUM CHLORIDE CRYS ER 20 MEQ PO TBCR
40.0000 meq | EXTENDED_RELEASE_TABLET | ORAL | Status: AC
  Administered 2021-10-23 (×2): 40 meq via ORAL
  Filled 2021-10-23: qty 2

## 2021-10-23 MED ORDER — ACETAMINOPHEN 325 MG PO TABS
650.0000 mg | ORAL_TABLET | Freq: Four times a day (QID) | ORAL | Status: DC | PRN
  Administered 2021-10-23 – 2021-10-28 (×11): 650 mg via ORAL
  Filled 2021-10-23 (×11): qty 2

## 2021-10-23 MED ORDER — ALBUTEROL SULFATE (2.5 MG/3ML) 0.083% IN NEBU: 3.0000 mL | INHALATION_SOLUTION | RESPIRATORY_TRACT | Status: DC | PRN

## 2021-10-23 MED ORDER — ONDANSETRON HCL 4 MG/2ML IJ SOLN
4.0000 mg | Freq: Once | INTRAMUSCULAR | Status: AC
  Administered 2021-10-23: 4 mg via INTRAVENOUS
  Filled 2021-10-23: qty 2

## 2021-10-23 MED ORDER — PROPRANOLOL HCL 20 MG PO TABS
40.0000 mg | ORAL_TABLET | Freq: Every evening | ORAL | Status: DC
Start: 2021-10-23 — End: 2021-10-23

## 2021-10-23 MED ORDER — METHOTREXATE 2.5 MG PO TABS
20.0000 mg | ORAL_TABLET | ORAL | Status: DC
  Administered 2021-10-27: 20 mg via ORAL
  Filled 2021-10-23: qty 8

## 2021-10-23 MED ORDER — IOHEXOL 350 MG/ML SOLN
125.0000 mL | Freq: Once | INTRAVENOUS | Status: AC | PRN
  Administered 2021-10-23: 125 mL via INTRAVENOUS

## 2021-10-23 MED ORDER — ONDANSETRON HCL 4 MG/2ML IJ SOLN: 4.0000 mg | Freq: Three times a day (TID) | INTRAMUSCULAR | Status: DC | PRN

## 2021-10-23 MED ORDER — PROPRANOLOL HCL 20 MG PO TABS: 40.0000 mg | ORAL_TABLET | Freq: Two times a day (BID) | ORAL | Status: DC

## 2021-10-23 MED ORDER — FOLIC ACID 1 MG PO TABS
1.0000 mg | ORAL_TABLET | Freq: Every day | ORAL | Status: DC
  Administered 2021-10-23 – 2021-10-29 (×7): 1 mg via ORAL
  Filled 2021-10-23 (×7): qty 1

## 2021-10-23 MED ORDER — SERTRALINE HCL 50 MG PO TABS
150.0000 mg | ORAL_TABLET | Freq: Every day | ORAL | Status: DC
  Administered 2021-10-23 – 2021-10-29 (×7): 150 mg via ORAL
  Filled 2021-10-23 (×7): qty 3

## 2021-10-23 MED ORDER — HYDRALAZINE HCL 20 MG/ML IJ SOLN: 5.0000 mg | INTRAMUSCULAR | Status: DC | PRN

## 2021-10-23 MED ORDER — LORATADINE 10 MG PO TABS
10.0000 mg | ORAL_TABLET | Freq: Every day | ORAL | Status: DC | PRN
  Administered 2021-10-26: 10 mg via ORAL
  Filled 2021-10-23: qty 1

## 2021-10-23 MED ORDER — POTASSIUM CHLORIDE 10 MEQ/100ML IV SOLN
10.0000 meq | INTRAVENOUS | Status: AC
  Administered 2021-10-23 (×2): 10 meq via INTRAVENOUS
  Filled 2021-10-23 (×2): qty 100

## 2021-10-23 MED ORDER — SODIUM CHLORIDE 0.9% IV SOLUTION: Freq: Once | INTRAVENOUS | Status: DC

## 2021-10-23 MED ORDER — PROPRANOLOL HCL 20 MG PO TABS
80.0000 mg | ORAL_TABLET | Freq: Every morning | ORAL | Status: DC
  Filled 2021-10-23: qty 4

## 2021-10-23 MED ORDER — PANTOPRAZOLE SODIUM 40 MG PO TBEC
40.0000 mg | DELAYED_RELEASE_TABLET | Freq: Every day | ORAL | Status: DC
  Administered 2021-10-23 – 2021-10-29 (×7): 40 mg via ORAL
  Filled 2021-10-23 (×7): qty 1

## 2021-10-23 MED ORDER — RED YEAST RICE 600 MG PO CAPS: 1.0000 | ORAL_CAPSULE | Freq: Every day | ORAL | Status: DC

## 2021-10-23 MED ORDER — GABAPENTIN 100 MG PO CAPS
100.0000 mg | ORAL_CAPSULE | Freq: Two times a day (BID) | ORAL | Status: DC
  Administered 2021-10-23 – 2021-10-29 (×13): 100 mg via ORAL
  Filled 2021-10-23 (×13): qty 1

## 2021-10-23 MED ORDER — OMEGA-3-ACID ETHYL ESTERS 1 G PO CAPS
1.0000 g | ORAL_CAPSULE | Freq: Every day | ORAL | Status: DC
  Administered 2021-10-23 – 2021-10-25 (×3): 1 g via ORAL
  Filled 2021-10-23 (×3): qty 1

## 2021-10-23 MED ORDER — POTASSIUM CHLORIDE CRYS ER 20 MEQ PO TBCR
40.0000 meq | EXTENDED_RELEASE_TABLET | Freq: Once | ORAL | Status: DC
Start: 2021-10-23 — End: 2021-10-23
  Filled 2021-10-23: qty 2

## 2021-10-23 MED ORDER — FENTANYL CITRATE PF 50 MCG/ML IJ SOSY
50.0000 ug | PREFILLED_SYRINGE | Freq: Once | INTRAMUSCULAR | Status: AC
  Administered 2021-10-23: 50 ug via INTRAVENOUS
  Filled 2021-10-23: qty 1

## 2021-10-23 MED ORDER — SODIUM CHLORIDE 0.9 % IV BOLUS
500.0000 mL | Freq: Once | INTRAVENOUS | Status: AC
  Administered 2021-10-23: 500 mL via INTRAVENOUS

## 2021-10-23 MED ORDER — VITAMIN D 25 MCG (1000 UNIT) PO TABS
500.0000 [IU] | ORAL_TABLET | Freq: Every day | ORAL | Status: DC
  Administered 2021-10-23 – 2021-10-29 (×7): 500 [IU] via ORAL
  Filled 2021-10-23 (×7): qty 1

## 2021-10-23 MED ORDER — OXYCODONE-ACETAMINOPHEN 5-325 MG PO TABS
1.0000 | ORAL_TABLET | ORAL | Status: DC | PRN
  Administered 2021-10-23 – 2021-10-24 (×4): 1 via ORAL
  Filled 2021-10-23 (×4): qty 1

## 2021-10-23 MED ORDER — LEVOTHYROXINE SODIUM 112 MCG PO TABS
112.0000 ug | ORAL_TABLET | Freq: Every day | ORAL | Status: DC
Start: 2021-10-24 — End: 2021-10-29
  Administered 2021-10-24 – 2021-10-29 (×6): 112 ug via ORAL
  Filled 2021-10-23 (×6): qty 1

## 2021-10-23 MED ORDER — MORPHINE SULFATE (PF) 4 MG/ML IV SOLN
4.0000 mg | Freq: Once | INTRAVENOUS | Status: AC
  Administered 2021-10-23: 4 mg via INTRAVENOUS
  Filled 2021-10-23: qty 1

## 2021-10-23 MED ORDER — PROTHROMBIN COMPLEX CONC HUMAN 500 UNITS IV KIT
5112.0000 [IU] | PACK | Status: AC
  Administered 2021-10-23: 5112 [IU] via INTRAVENOUS
  Filled 2021-10-23: qty 5112

## 2021-10-23 NOTE — Consult Note (Signed)
[facility] [place]  Patient name: Glenda Williams MRN: [id number] DOB: [birth year] Sex: female   REQUESTING PROVIDER:    ER   REASON FOR CONSULT:    Right thigh hematoma  HISTORY OF PRESENT ILLNESS:   Glenda Williams is a 81 y.o. female, who was brought to the emergency department this morning after a fall.  She has had a expanding hematoma in her proximal right lateral thigh.  She is on Xarelto for atrial fibrillation.  A CT scan showed possible pseudoaneurysm with extravasation.  Her Xarelto was reversed with Kcentra.  She remains hemodynamically stable.  She is tender over the site of her fall.  The patient is on Xarelto for atrial fibrillation.  She is medically managed for hypertension.  PAST MEDICAL HISTORY    Past Medical History:  Diagnosis Date   A-fib [facility]    Arthritis    Bell palsy 09/04/2014   GERD (gastroesophageal reflux disease)    Heart disease    Rheumatoid arthritis ([place])    Thyroid disease    Tremors of nervous system      FAMILY HISTORY   Family History  Problem Relation Age of Onset   Stroke Mother    Hypertension Mother    Heart disease Father    Arthritis Father    Parkinson's disease Sister    Parkinson's disease Brother    Bladder Cancer Brother    Breast cancer Neg Hx    Kidney cancer Neg Hx     SOCIAL HISTORY:   Social History   Socioeconomic History   Marital status: Married    Spouse name: Not on file   Number of children: 2   Years of education: Not on file   Highest education level: Some college, no degree  Occupational History   Not on file  Tobacco Use   Smoking status: Never   Smokeless tobacco: Never  Vaping Use   Vaping Use: Never used  Substance and Sexual Activity   Alcohol use: No   Drug use: No   Sexual activity: Not on file  Other Topics Concern   Not on file  Social History Narrative   Not on file   Social Determinants of Health   Financial  Resource Strain: Low Risk  (11/18/2019)   Overall Financial Resource Strain (CARDIA)    Difficulty of Paying Living Expenses: Not very hard  Food Insecurity: No Food Insecurity (11/18/2019)   Hunger Vital Sign    Worried About Running Out of Food in the Last Year: Never true    Ran Out of Food in the Last Year: Never true  Transportation Needs: No Transportation Needs (11/18/2019)   PRAPARE - Hydrologist (Medical): No    Lack of Transportation (Non-Medical): No  Physical Activity: Inactive (11/18/2019)   Exercise Vital Sign    Days of Exercise per Week: 0 days    Minutes of Exercise per Session: 0 min  Stress: No Stress Concern Present (11/18/2019)   [place]    Feeling of Stress : Not at all  Social Connections: Moderately Integrated (11/18/2019)   Social Connection and Isolation Panel [NHANES]    Frequency of Communication with Friends and Family: More than three times a week    Frequency of Social Gatherings with Friends and Family: Three times a week    Attends Religious Services: Never    Active Member of Clubs or Organizations: Yes  Attends Archivist Meetings: 1 to 4 times per year    Marital Status: Married  Human resources officer Violence: Not At Risk (11/18/2019)   Humiliation, Afraid, Rape, and Kick questionnaire    Fear of Current or Ex-Partner: No    Emotionally Abused: No    Physically Abused: No    Sexually Abused: No    ALLERGIES:    Allergies  Allergen Reactions   Bactrim [Sulfamethoxazole-Trimethoprim] Anaphylaxis   Macrobid [Nitrofurantoin Macrocrystal] Rash   Mirabegron Rash   Oxybutynin Rash   Penicillins Rash    Has patient had a PCN reaction causing immediate rash, facial/tongue/throat swelling, SOB or lightheadedness with hypotension: Yes Has patient had a PCN reaction causing severe rash involving mucus membranes or skin necrosis: No Has patient had a  PCN reaction that required hospitalization: No Has patient had a PCN reaction occurring within the last 10 years: Unknown If all of the above answers are "NO", then may proceed with Cephalosporin use.     CURRENT MEDICATIONS:    Current Facility-Administered Medications  Medication Dose Route Frequency Provider Last Rate Last Admin   0.9 %  sodium chloride infusion (Manually program via Guardrails IV Fluids)   Intravenous Once Ivor Costa, MD       0.9 %  sodium chloride infusion   Intravenous Continuous Ivor Costa, MD 75 mL/hr at 10/23/21 1042 Infusion Verify at 10/23/21 1042   acetaminophen (TYLENOL) tablet 650 mg  650 mg Oral Q6H PRN Ivor Costa, MD   650 mg at 10/23/21 1143   albuterol (PROVENTIL) (2.5 MG/3ML) 0.083% nebulizer solution 3 mL  3 mL Inhalation Q4H PRN Ivor Costa, MD       cholecalciferol (VITAMIN D3) 25 MCG (1000 UNIT) tablet 500 Units  500 Units Oral Daily Ivor Costa, MD   500 Units at 10/23/21 1253   dextromethorphan-guaiFENesin ([place] DM) 30-600 MG per 12 hr tablet 1 tablet  1 tablet Oral BID PRN Ivor Costa, MD       folic acid (FOLVITE) tablet 1 mg  1 mg Oral Daily Ivor Costa, MD   1 mg at 10/23/21 1253   gabapentin (NEURONTIN) capsule 100 mg  100 mg Oral BID Ivor Costa, MD   100 mg at 10/23/21 1254   hydrALAZINE (APRESOLINE) injection 5 mg  5 mg Intravenous Q2H PRN Ivor Costa, MD       Derrill Memo ON 10/24/2021] levothyroxine (SYNTHROID) tablet 112 mcg  112 mcg Oral QAC breakfast Ivor Costa, MD       loratadine (CLARITIN) tablet 10 mg  10 mg Oral Daily PRN Ivor Costa, MD       Derrill Memo ON 10/27/2021] methotrexate (RHEUMATREX) tablet 20 mg  20 mg Oral Q Justice Deeds, Soledad Gerlach, MD       omega-3 acid ethyl esters (LOVAZA) capsule 1 g  1 g Oral Daily Ivor Costa, MD       ondansetron [facility]) injection 4 mg  4 mg Intravenous Q8H PRN Ivor Costa, MD       oxyCODONE-acetaminophen (PERCOCET/ROXICET) 5-325 MG per tablet 1 tablet  1 tablet Oral Q4H PRN Ivor Costa, MD   1 tablet at 10/23/21 1143    pantoprazole (PROTONIX) EC tablet 40 mg  40 mg Oral Daily Ivor Costa, MD   40 mg at 10/23/21 1253   potassium chloride SA (KLOR-CON M) CR tablet 40 mEq  40 mEq Oral Q4H Ivor Costa, MD   40 mEq at 10/23/21 1102   sertraline (ZOLOFT) tablet 150 mg  150 mg Oral  Daily Ivor Costa, MD   150 mg at 10/23/21 1253   Current Outpatient Medications  Medication Sig Dispense Refill   cholecalciferol (VITAMIN D) 400 units TABS tablet Take 400 Units by mouth daily.     folic acid (FOLVITE) 1 MG tablet Take 1 mg by mouth daily.     gabapentin (NEURONTIN) 100 MG capsule Take 100 mg by mouth 3 (three) times daily. Per patient taking BID     hydrochlorothiazide (HYDRODIURIL) 25 MG tablet Take 25 mg by mouth daily.     levothyroxine (SYNTHROID) 112 MCG tablet Take 1 tablet (112 mcg total) by mouth daily. 90 tablet 1   Omega-3 Fatty Acids (FISH OIL) 1000 MG CAPS Take 1 capsule by mouth daily.     omeprazole (PRILOSEC) 20 MG capsule TAKE 1 CAPSULE BY MOUTH  TWICE DAILY BEFORE MEALS. 180 capsule 3   potassium chloride SA (K-DUR,KLOR-CON) 20 MEQ tablet Take 1 tablet by mouth  daily 90 tablet 1   propranolol (INDERAL) 40 MG tablet 80 mg in the am and 40 mg at night     Red Yeast Rice 600 MG CAPS Take 1 capsule by mouth daily.     rivaroxaban (XARELTO) 20 MG TABS tablet Take 20 mg by mouth daily with supper.     sertraline (ZOLOFT) 50 MG tablet TAKE 3 TABLETS BY MOUTH  DAILY 270 tablet 3   albuterol (VENTOLIN HFA) 108 (90 Base) MCG/ACT inhaler Inhale 2 puffs into the lungs every 6 (six) hours as needed for wheezing or shortness of breath. 18 g 2   cetirizine (ZYRTEC) 10 MG tablet Take 10 mg by mouth daily as needed.      conjugated estrogens (PREMARIN) vaginal cream Apply 0.'5mg'$  (pea-sized amount)  just inside the vaginal introitus with a finger-tip on  Monday, Wednesday and Friday nights. 30 g 12   EPINEPHrine 0.3 mg/0.3 mL IJ SOAJ injection Inject 0.3 mLs into the skin once as needed for anaphylaxis.  1   estradiol  (ESTRACE) 0.1 MG/GM vaginal cream Apply 0.5 g to the vaginal introitus on Monday, Wednesday and Friday nights 42.5 g 12   hydrocortisone cream 1 % Apply 1 application topically as needed for itching.     ketoconazole (NIZORAL) 2 % cream Apply 1 application topically daily. 30 g 2   methotrexate (RHEUMATREX) 2.5 MG tablet Take 20 mg by mouth every Thursday.      NYSTATIN powder APPLY TOPICALLY TO SKIN FOLDS THREE TIMES DAILY 60 g 1    REVIEW OF SYSTEMS:   '[X]'$  denotes positive finding, '[ ]'$  denotes negative finding Cardiac  Comments:  Chest pain or chest pressure:    Shortness of breath upon exertion:    Short of breath when lying flat:    Irregular heart rhythm:        Vascular    Pain in calf, thigh, or hip brought on by ambulation:    Pain in feet at night that wakes you up from your sleep:     Blood clot in your veins:    Leg swelling:         Pulmonary    Oxygen at home:    Productive cough:     Wheezing:         Neurologic    Sudden weakness in arms or legs:     Sudden numbness in arms or legs:     Sudden onset of difficulty speaking or slurred speech:    Temporary loss of vision in one eye:  Problems with dizziness:         Gastrointestinal    Blood in stool:      Vomited blood:         Genitourinary    Burning when urinating:     Blood in urine:        Psychiatric    Major depression:         Hematologic    Bleeding problems:    Problems with blood clotting too easily:        Skin    Rashes or ulcers:        Constitutional    Fever or chills:     PHYSICAL EXAM:   Vitals:   10/23/21 1255 10/23/21 1256 10/23/21 1300 10/23/21 1305  BP: (!) 88/42 (!) 89/47 (!) 96/40 (!) 90/58  Pulse: 79 60 75 74  Resp: 18 16 (!) 24 10  Temp:      TempSrc:      SpO2: 96% 95% 100% 98%  Weight:      Height:        GENERAL: The patient is a well-nourished female, in no acute distress. The vital signs are documented above. CARDIAC: There is a regular rate and  rhythm.  VASCULAR: Palpable right dorsalis pedis pulse.  Tender hematoma in the proximal right lateral thigh PULMONARY: Nonlabored respirations ABDOMEN: Soft and non-tender with normal pitched bowel sounds.  MUSCULOSKELETAL: There are no major deformities or cyanosis. NEUROLOGIC: No focal weakness or paresthesias are detected. SKIN: There are no ulcers or rashes noted. PSYCHIATRIC: The patient has a normal affect.  STUDIES:   I have reviewed her CT scan with the following findings: Large hematoma within the subcutaneous fat of the lateral right thigh. Along the medial aspect of the hematoma there is a pseudoaneurysm, which likely arises off a lateral branch of the right deep femoral artery.  ASSESSMENT and PLAN   Right thigh hematoma: This has resulted from trauma and the fact that she is on Xarelto for her atrial fibrillation.  Her Xarelto has been reversed.  She has had a slight decrease in her hemoglobin.  She is hemodynamically stable.  I would like to try to manage this nonoperatively.  A pressure dressing is being placed over top of the hematoma.  This should resolve with time.  She will be on strict bedrest and have serial hemoglobin checks.  If she continues to have a drop in her hemoglobin or becomes hemodynamically unstable, I would proceed with angiography and possible embolization however this is a small artery off of the profunda that is very lateral, and so I suspect this will resolve on its own.  She does not have any skin changes that would prompt decompression of the hematoma at this time.   Leia Alf, MD, FACS [facility] [phone] Pager [phone]

## 2021-10-23 NOTE — Assessment & Plan Note (Signed)
-   Hold propranolol due to softer blood pressure

## 2021-10-23 NOTE — Assessment & Plan Note (Signed)
Synthroid 

## 2021-10-23 NOTE — Consult Note (Signed)
[place] for [facility] Indication: Xarelto reversal ISO significant bleed   Allergies  Allergen Reactions   Bactrim [Sulfamethoxazole-Trimethoprim] Anaphylaxis   Macrobid [Nitrofurantoin Macrocrystal] Rash   Mirabegron Rash   Oxybutynin Rash   Penicillins Rash    Has patient had a PCN reaction causing immediate rash, facial/tongue/throat swelling, SOB or lightheadedness with hypotension: Yes Has patient had a PCN reaction causing severe rash involving mucus membranes or skin necrosis: No Has patient had a PCN reaction that required hospitalization: No Has patient had a PCN reaction occurring within the last 10 years: Unknown If all of the above answers are "NO", then may proceed with Cephalosporin use.     Patient Measurements: Height: '5\' 5"'$  (165.1 cm) Weight: 104.8 kg (231 lb 0.7 oz) IBW/kg (Calculated) : 57  Vital Signs: Temp: 97.7 F (36.5 C) (08/20 0528) Temp Source: Oral (08/20 0528) BP: 132/92 (08/20 0800) Pulse Rate: 68 (08/20 0800) Intake/Output from previous day: No intake/output data recorded. Intake/Output from this shift: No intake/output data recorded. Vent settings for last 24 hours:    Labs: Recent Labs    10/23/21 0703  WBC 10.4  HGB 10.7*  HCT 34.3*  PLT 311  INR 2.4*  CREATININE 0.82   Estimated Creatinine Clearance: 66.8 mL/min (by C-G formula based on SCr of 0.82 mg/dL).  No results for input(s): "GLUCAP" in the last 72 hours.  Microbiology: Recent Results (from the past 720 hour(s))  SARS Coronavirus 2 by RT PCR (hospital order, performed in [facility] hospital lab) *cepheid single result test* Anterior Nasal Swab     Status: None   Collection Time: 10/23/21  7:03 AM   Specimen: Anterior Nasal Swab  Result Value Ref Range Status   SARS Coronavirus 2 by RT PCR NEGATIVE NEGATIVE Final    Comment: (NOTE) SARS-CoV-2 target nucleic acids are NOT DETECTED.  The SARS-CoV-2 RNA is generally detectable in  upper and lower respiratory specimens during the acute phase of infection. The lowest concentration of SARS-CoV-2 viral copies this assay can detect is 250 copies / mL. A negative result does not preclude SARS-CoV-2 infection and should not be used as the sole basis for treatment or other patient management decisions.  A negative result may occur with improper specimen collection / handling, submission of specimen other than nasopharyngeal swab, presence of viral mutation(s) within the areas targeted by this assay, and inadequate number of viral copies (<250 copies / mL). A negative result must be combined with clinical observations, patient history, and epidemiological information.  Fact Sheet for Patients:   https://www.patel.info/  Fact Sheet for Healthcare Providers: https://hall.com/  This test is not yet approved or  cleared by the [place] FDA and has been authorized for detection and/or diagnosis of SARS-CoV-2 by FDA under an Emergency Use Authorization (EUA).  This EUA will remain in effect (meaning this test can be used) for the duration of the COVID-19 declaration under Section 564(b)(1) of the Act, 21 U.S.C. section 360bbb-3(b)(1), unless the authorization is terminated or revoked sooner.  Performed at [facility], [address] [place],  [address]     Medications:  Scheduled:   potassium chloride  40 mEq Oral Once   Infusions:   sodium chloride 125 mL/hr at 10/23/21 2297   prothrombin complex conc human (KCENTRA) IVPB 5,112 Units      Assessment: Glenda Williams is a 81 y.o. female presenting to the ED after a fall at home. PMH significant for Afib on  Xarelto (last dose reported 10/22/2021 PM), RA on golimumab. CT head without contrast WNL, CT cervical spine without contrast WNL, CTA of RLE showed large hematoma within the subcutaneous fat of the lateral right thigh. Hgb 10.7, Hct 34.3, Plt 311,  aPTT 38. Confirmed with MD that he and vascular would like to go ahead with Xarelto reversal given arterial bleeding seen on CTA of RLE.    Plan:  Give Kcentra 5112 units x1. No additional monitoring indicated for DOAC reversal.   Gretel Acre, PharmD PGY1 Pharmacy Resident 10/23/2021 10:30 AM

## 2021-10-23 NOTE — Assessment & Plan Note (Signed)
-   As needed Tylenol and Percocet pain

## 2021-10-23 NOTE — Progress Notes (Signed)
PHARMACIST - PHYSICIAN ORDER COMMUNICATION  CONCERNING: P&T Medication Policy on Herbal Medications  DESCRIPTION:  This patient's order for:  Red Yeast Rice CAPS 600   has been noted.  This product(s) is classified as an "herbal" or natural product. Due to a lack of definitive safety studies or FDA approval, nonstandard manufacturing practices, plus the potential risk of unknown drug-drug interactions while on inpatient medications, the Pharmacy and Therapeutics Committee does not permit the use of "herbal" or natural products of this type within [facility].   ACTION TAKEN: The pharmacy department is unable to verify this order at this time.   Please reevaluate patient's clinical condition at discharge and address if the herbal or natural product(s) should be resumed at that time.  Pernell Dupre, PharmD, BCPS Clinical Pharmacist 10/23/2021 11:51 AM

## 2021-10-23 NOTE — ED Notes (Signed)
Pt to radiology.

## 2021-10-23 NOTE — Assessment & Plan Note (Addendum)
Hematoma of thigh and acute blood loss anemia: CTA showed a large hematoma within the subcutaneous fat of the lateral right thigh. Along the medial aspect of the hematoma there is a pseudoaneurysm, which likely arises off a lateral branch of the right deep femoral artery. Hgb 11.3 --> 10.7, with soft Bp. Consulted Dr .Trula Slade of [facility].  -will place in PCU for obs -Patient received 1 dose of Kcentra in ED -2 Large born IV  -INR/PTT/type screen -check CBC q6h. Goal of transfusion is Hgb< 7.0 -hold off Xarelto -IV fluid: 500 cc normal saline, followed by 75 cc/h  Addendum: hgb dropped further 11.3 --> 10.7 --> 8.7.

## 2021-10-23 NOTE — ED Provider Notes (Signed)
[facility] Provider Note    Event Date/Time   First MD Initiated Contact with Patient 10/23/21 0530     (approximate)   History   Fall   HPI  Glenda Williams is a 81 y.o. female history of atrial fibrillation on Xarelto, rheumatoid arthritis getting golimumab infusions who presents emergency department with her son after she fell out of bed.  Patient states she rolled out of bed onto her right side.  Has a developing hematoma to the posterior thigh.  Also complaining of central chest pain that son thinks was from him lifting her off the floor.  She is not sure if she hit her head.  No neck or back pain.  No abdominal pain.   History provided by patient and son.    Past Medical History:  Diagnosis Date   A-fib [facility])    Arthritis    Bell palsy 09/04/2014   GERD (gastroesophageal reflux disease)    Heart disease    Rheumatoid arthritis (HCC)    Thyroid disease    Tremors of nervous system     Past Surgical History:  Procedure Laterality Date   BREAST BIOPSY Left 1988   Benign   BREAST BIOPSY Left 1987   Benign   BREAST EXCISIONAL BIOPSY     BROW LIFT Bilateral 05/23/2016   Procedure: BLEPHAROPLASTY upper eyelid; w/excess skin;  Surgeon: Karle Starch, MD;  Location: [facility];  Service: Ophthalmology;  Laterality: Bilateral;   CARPAL TUNNEL RELEASE Bilateral    CHOLECYSTECTOMY     Dr. Bary Castilla   COLONOSCOPY  2006   JOINT REPLACEMENT Bilateral    knees   REPLACEMENT TOTAL KNEE Left 2005   REPLACEMENT TOTAL KNEE Right 1998    MEDICATIONS:  Prior to Admission medications   Medication Sig Start Date End Date Taking? Authorizing Provider  albuterol (VENTOLIN HFA) 108 (90 Base) MCG/ACT inhaler Inhale 2 puffs into the lungs every 6 (six) hours as needed for wheezing or shortness of breath. 08/24/21   Bacigalupo, Dionne Bucy, MD  cetirizine (ZYRTEC) 10 MG tablet Take 10 mg by mouth daily as needed.     [provider]  cholecalciferol  (VITAMIN D) 400 units TABS tablet Take 400 Units by mouth daily.    [provider]  conjugated estrogens (PREMARIN) vaginal cream Apply 0.'5mg'$  (pea-sized amount)  just inside the vaginal introitus with a finger-tip on  Monday, Wednesday and Friday nights. 12/28/20   Zara Council A, PA-C  EPINEPHrine 0.3 mg/0.3 mL IJ SOAJ injection Inject 0.3 mLs into the skin once as needed for anaphylaxis. 11/03/17   [provider]  estradiol (ESTRACE) 0.1 MG/GM vaginal cream Apply 0.5 g to the vaginal introitus on Monday, Wednesday and Friday nights 02/16/21   Zara Council A, PA-C  folic acid (FOLVITE) 1 MG tablet Take 1 mg by mouth daily.    [provider]  gabapentin (NEURONTIN) 100 MG capsule Take 100 mg by mouth 3 (three) times daily. Per patient taking BID 05/19/21   [provider]  hydrocortisone cream 1 % Apply 1 application topically as needed for itching.    [provider]  ketoconazole (NIZORAL) 2 % cream Apply 1 application topically daily. 04/20/21   Virginia Crews, MD  levothyroxine (SYNTHROID) 112 MCG tablet Take 1 tablet (112 mcg total) by mouth daily. 08/23/21   Virginia Crews, MD  methotrexate (RHEUMATREX) 2.5 MG tablet Take 20 mg by mouth every Thursday.  10/25/13  [provider]  NYSTATIN powder APPLY TOPICALLY TO SKIN FOLDS THREE TIMES DAILY 06/20/21   [name], FNP  Omega-3 Fatty Acids (FISH OIL) 1000 MG CAPS Take 1 capsule by mouth daily.    [provider]  omeprazole (PRILOSEC) 20 MG capsule TAKE 1 CAPSULE BY MOUTH  TWICE DAILY BEFORE MEALS. 08/23/21   Virginia Crews, MD  potassium chloride SA (K-DUR,KLOR-CON) 20 MEQ tablet Take 1 tablet by mouth  daily 03/26/15   Margarita Rana, MD  propranolol (INDERAL) 40 MG tablet 80 mg in the am and 40 mg at night 03/06/19   [provider]  Red Yeast Rice 600 MG CAPS Take 1 capsule by mouth daily.    [provider]  rivaroxaban (XARELTO) 20  MG TABS tablet Take 20 mg by mouth daily with supper. 02/07/16   [provider]  sertraline (ZOLOFT) 50 MG tablet TAKE 3 TABLETS BY MOUTH  DAILY 06/20/21   Virginia Crews, MD    Physical Exam   Triage Vital Signs: ED Triage Vitals  Enc Vitals Group     BP 10/23/21 0528 (!) 151/69     Pulse Rate 10/23/21 0528 65     Resp 10/23/21 0528 16     Temp 10/23/21 0528 97.7 F (36.5 C)     Temp Source 10/23/21 0528 Oral     SpO2 10/23/21 0528 98 %     Weight 10/23/21 0526 231 lb 0.7 oz (104.8 kg)     Height 10/23/21 0526 '5\' 5"'$  (1.651 m)     Head Circumference --      Peak Flow --      Pain Score 10/23/21 0525 8     Pain Loc --      Pain Edu? --      Excl. in [place]? --     Most recent vital signs: Vitals:   10/23/21 0528  BP: (!) 151/69  Pulse: 65  Resp: 16  Temp: 97.7 F (36.5 C)  SpO2: 98%     CONSTITUTIONAL: Alert and oriented and responds appropriately to questions. Well-appearing; well-nourished; GCS 72, elderly HEAD: Normocephalic; atraumatic EYES: Conjunctivae clear, PERRL, EOMI ENT: normal nose; no rhinorrhea; moist mucous membranes; pharynx without lesions noted; no dental injury; no septal hematoma, no epistaxis; no facial deformity or bony tenderness NECK: Supple, no midline spinal tenderness, step-off or deformity; trachea midline CARD: RRR; S1 and S2 appreciated; no murmurs, no clicks, no rubs, no gallops RESP: Normal chest excursion without splinting or tachypnea; breath sounds clear and equal bilaterally; no wheezes, no rhonchi, no rales; no hypoxia or respiratory distress CHEST:  chest wall stable, no crepitus or ecchymosis or deformity, patient is tender over the anterior chest wall and sternum ABD/GI: Normal bowel sounds; non-distended; soft, non-tender, no rebound, no guarding; no ecchymosis or other lesions noted PELVIS:  stable, nontender to palpation BACK:  The back appears normal; no midline spinal tenderness, step-off or deformity EXT: Patient  has a large hematoma to the posterior right thigh.  Son states it has doubled in size in the past hour.  I do not see that it is expanding while I am in the room however.  Her extremities are warm and well-perfused.  She has tenderness over the posterior right hip without deformity or leg length discrepancy.  Otherwise extremities nontender.  Compartments are soft. SKIN: Normal color for age and race; warm NEURO: No facial asymmetry, normal speech, moving all extremities equally  ED Results / Procedures / Treatments  LABS: (all labs ordered are listed, but only abnormal results are displayed) Labs Reviewed  SARS CORONAVIRUS 2 BY RT PCR  CBC WITH DIFFERENTIAL/PLATELET  BASIC METABOLIC PANEL  PROTIME-INR  PROCALCITONIN  URINALYSIS, ROUTINE W REFLEX MICROSCOPIC  SAMPLE TO BLOOD BANK  TROPONIN I (HIGH SENSITIVITY)     EKG:  EKG Interpretation  Date/Time:    Ventricular Rate:    PR Interval:    QRS Duration:   QT Interval:    QTC Calculation:   R Axis:     Text Interpretation:            RADIOLOGY: My personal review and interpretation of imaging: CT scans and x-rays show no acute traumatic injury.  Chest x-ray concerning for right middle lobe opacity.  I have personally reviewed all radiology reports. CT HEAD WO CONTRAST (5MM)  Result Date: 10/23/2021 CLINICAL DATA:  Minor head trauma.  Fall from bed EXAM: CT HEAD WITHOUT CONTRAST CT CERVICAL SPINE WITHOUT CONTRAST TECHNIQUE: Multidetector CT imaging of the head and cervical spine was performed following the standard protocol without intravenous contrast. Multiplanar CT image reconstructions of the cervical spine were also generated. RADIATION DOSE REDUCTION: This exam was performed according to the departmental dose-optimization program which includes automated exposure control, adjustment of the mA and/or kV according to patient size and/or use of iterative reconstruction technique. COMPARISON:  09/13/2006 head CT FINDINGS:  CT HEAD FINDINGS Brain: No evidence of swelling, infarction, hemorrhage, hydrocephalus, extra-axial collection or mass lesion/mass effect. Vascular: No hyperdense vessel or unexpected calcification. Skull: Negative for fracture Sinuses/Orbits: No evidence of injury CT CERVICAL SPINE FINDINGS Alignment: No traumatic malalignment Skull base and vertebrae: No acute fracture.  Generalized osteopenia Soft tissues and spinal canal: No prevertebral fluid or swelling. No visible canal hematoma. Disc levels:  Ordinary degenerative changes Upper chest: No acute finding IMPRESSION: No evidence of acute intracranial or cervical spine injury. Electronically Signed   By: Jorje Guild M.D.   On: 10/23/2021 07:00   CT Cervical Spine Wo Contrast  Result Date: 10/23/2021 CLINICAL DATA:  Minor head trauma.  Fall from bed EXAM: CT HEAD WITHOUT CONTRAST CT CERVICAL SPINE WITHOUT CONTRAST TECHNIQUE: Multidetector CT imaging of the head and cervical spine was performed following the standard protocol without intravenous contrast. Multiplanar CT image reconstructions of the cervical spine were also generated. RADIATION DOSE REDUCTION: This exam was performed according to the departmental dose-optimization program which includes automated exposure control, adjustment of the mA and/or kV according to patient size and/or use of iterative reconstruction technique. COMPARISON:  09/13/2006 head CT FINDINGS: CT HEAD FINDINGS Brain: No evidence of swelling, infarction, hemorrhage, hydrocephalus, extra-axial collection or mass lesion/mass effect. Vascular: No hyperdense vessel or unexpected calcification. Skull: Negative for fracture Sinuses/Orbits: No evidence of injury CT CERVICAL SPINE FINDINGS Alignment: No traumatic malalignment Skull base and vertebrae: No acute fracture.  Generalized osteopenia Soft tissues and spinal canal: No prevertebral fluid or swelling. No visible canal hematoma. Disc levels:  Ordinary degenerative changes Upper  chest: No acute finding IMPRESSION: No evidence of acute intracranial or cervical spine injury. Electronically Signed   By: Jorje Guild M.D.   On: 10/23/2021 07:00   DG Chest 2 View  Result Date: 10/23/2021 CLINICAL DATA:  Status post fall.  Complains of right rib cage pain EXAM: CHEST - 2 VIEW COMPARISON:  CT 11/04/2017 FINDINGS: Cardiac enlargement. Aortic atherosclerosis. No pleural effusion or edema. No airspace opacities identified. There is an a airspace opacity on the lateral projection radiograph, likely right middle  lobe.Spondylosis identified within the thoracic spine. Unchanged compression fracture within the midthoracic spine. IMPRESSION: 1. Right middle lobe airspace opacity noted on the lateral radiograph concerning for pneumonia. In the absence of any signs or symptoms of infection consider further evaluation with CT of the chest for more definitive characterization and to rule out underlying mass lesion. 2. Cardiac enlargement and aortic atherosclerosis. Electronically Signed   By: Kerby Moors M.D.   On: 10/23/2021 06:47   DG Sternum  Result Date: 10/23/2021 CLINICAL DATA:  Chest pain after assistance off the ground. EXAM: STERNUM - 1 VIEW COMPARISON:  Chest CT 11/04/2017 FINDINGS: There is no evidence of fracture or other focal bone lesions. Subjective osteopenia. IMPRESSION: No evidence of sternal fracture in the lateral projection. Electronically Signed   By: Jorje Guild M.D.   On: 10/23/2021 06:47   DG Femur Min 2 Views Right  Result Date: 10/23/2021 CLINICAL DATA:  Right hip and leg pain after fall from bed EXAM: RIGHT FEMUR 2 VIEWS COMPARISON:  Knee radiograph 12/21/2015 FINDINGS: No evidence of fracture. No hip or knee dislocation. Generalized osteopenia. 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 IMPRESSION: No acute finding. Electronically Signed   By: Jorje Guild M.D.    On: 10/23/2021 06:45   DG Hip Unilat W or Wo Pelvis 2-3 Views Right  Result Date: 10/23/2021 CLINICAL DATA:  Fall with right hip pain EXAM: DG HIP (WITH OR WITHOUT PELVIS) 3V RIGHT COMPARISON:  None Available. FINDINGS: No visible hip fracture. No hip dislocation. Ring of osteophytes at the bilateral hip joint. Trochanteric and iliac osteophytes. Generalized osteopenia. IMPRESSION: No acute finding. Electronically Signed   By: Jorje Guild M.D.   On: 10/23/2021 06:44     PROCEDURES:  Critical Care performed: No     .1-3 Lead EKG Interpretation  Performed by: Valaria Kohut, Delice Bison, DO Authorized by: Tondra Reierson, Delice Bison, DO     Interpretation: normal     ECG rate:  65   ECG rate assessment: normal     Rhythm: sinus rhythm     Ectopy: none     Conduction: normal       IMPRESSION / MDM / ASSESSMENT AND PLAN / ED COURSE  I reviewed the triage vital signs and the nursing notes.  Patient here with a fall out of bed.  Has a developing hematoma to the posterior right thigh and right hip pain.  She is on Xarelto.  Unsure if she hit her head.  Is also complaining of chest "soreness".  The patient is on the cardiac monitor to evaluate for evidence of arrhythmia and/or significant heart rate changes.   DIFFERENTIAL DIAGNOSIS (includes but not limited to):   Hematoma, hip fracture, hip dislocation, contusion, rib fractures, sternal fracture, suspect the soreness in her chest is musculoskeletal.  Low suspicion for ACS.  Patient's presentation is most consistent with acute presentation with potential threat to life or bodily function.  PLAN: We will obtain CBC, BMP and INR.  We will closely monitor for signs of an expanding hematoma given son reports that this area of swelling has already doubled in size in the past hour.  We will place ice to this area and they were doing this at home as well.  We will obtain x-rays of the right hip, femur, CT of the head and cervical spine, x-rays of the sternum  and chest.  We will give pain medication here.   MEDICATIONS GIVEN  IN ED: Medications  0.9 %  sodium chloride infusion (has no administration in time range)  fentaNYL (SUBLIMAZE) injection 50 mcg (50 mcg Intravenous Given 10/23/21 0658)  ondansetron (ZOFRAN) injection 4 mg (4 mg Intravenous Given 10/23/21 0656)     ED COURSE: CT head and cervical spine reviewed and interpreted by myself and the radiologist and shows no acute abnormality.  X-rays of the right lower extremity show no fracture or dislocation.  Hematoma seems to be enlarging still.  Will obtain CTA of the right lower extremity to evaluate for any signs of active extravasation.  Chest x-ray also reviewed and interpreted by myself and the radiologist and is concerning for a right middle lobe opacity.  She reports she has had some congestion but no chest pain other than the chest soreness after the fall today and no shortness of breath or cough.  We will proceed with CT with contrast of the chest for further evaluation and to evaluate for possible infiltrate versus malignancy.  We will add on procalcitonin, COVID swab.  Low suspicion for PE.  Patient continues to be hemodynamically stable.  Will be signed out the oncoming ED physician at 7 AM.   CONSULTS: Dispo pending further work-up.   OUTSIDE RECORDS REVIEWED: Reviewed patient's last rheumatology note on 09/08/2021.  Reviewed last cardiology note on 08/23/2021.       FINAL CLINICAL IMPRESSION(S) / ED DIAGNOSES   Final diagnoses:  Fall, initial encounter  Hematoma of right thigh, initial encounter     Rx / DC Orders   ED Discharge Orders     None        Note:  This document was prepared using Dragon voice recognition software and may include unintentional dictation errors.   Kiyomi Pallo, Delice Bison, DO 10/23/21 [phone]

## 2021-10-23 NOTE — ED Provider Notes (Signed)
-----------------------------------------   7:13 AM on 10/23/2021 -----------------------------------------  Blood pressure (!) 151/69, pulse 65, temperature 97.7 F (36.5 C), temperature source Oral, resp. rate 16, height '5\' 5"'$  (1.651 m), weight 104.8 kg, SpO2 98 %.  Assuming care from Dr. Leonides Schanz.  In short, Glenda Williams is a 81 y.o. female with a chief complaint of Fall .  Refer to the original H&P for additional details.  The current plan of care is to follow-up imaging for fall out of bed.  ----------------------------------------- 10:35 AM on 10/23/2021 ----------------------------------------- CT chest is unremarkable, no evidence of malignancy or pneumonia.  CTA of right lower extremity shows large hematoma with active extravasation in the arterial phase.  Patient reports pain improved following IV morphine, continues to have strong DP pulse on the right.  Findings discussed with Dr. Trula Slade of vascular surgery, who does not recommend embolization at this time but we will reverse her Xarelto and place Ace wrap around the leg to provide pressure.  Dr. Trula Slade to evaluate the patient later today, recommends hospitalist admission for observation.  Case discussed with hospitalist for admission.    Blake Divine, MD 10/23/21 1037

## 2021-10-23 NOTE — Assessment & Plan Note (Signed)
-   Continue home medications 

## 2021-10-23 NOTE — Assessment & Plan Note (Signed)
-  fall precaution -pt/ot tomorrow when able to

## 2021-10-23 NOTE — Assessment & Plan Note (Signed)
2D echo on 11/04/2017 showed EF of 55-60% with grade 1 diastolic dysfunction.  Patient does not have leg edema.  No shortness breath.  No O2 desaturation.  dCHF is compensated. -Watch volume status closely

## 2021-10-23 NOTE — Assessment & Plan Note (Signed)
-  see above 

## 2021-10-23 NOTE — Consult Note (Signed)
Full note to follow Xarelto now reversed Will plan for non-operative management as long as she remains stable, as this should resolve on its own Check serial Hb, I suspect the next one will reflect a rather significant drop, she remains HD stable SCD's for dvt prophylaxis Strict bed rest May need repeat CT tomorrow  Annamarie Major

## 2021-10-23 NOTE — Assessment & Plan Note (Signed)
  BMI= 38.45  and BW= 104.8 -Diet and exercise.   -Encourage to lose weight.

## 2021-10-23 NOTE — ED Notes (Signed)
Pt signed blood consent, placed on patient chart with labels.

## 2021-10-23 NOTE — Assessment & Plan Note (Signed)
-   Hold HCTZ and propranolol due to softer blood pressure -We hydralazine as needed

## 2021-10-23 NOTE — ED Notes (Signed)
Pts right upper thigh wrapped with ace wrap per Dr. Charna Archer order.

## 2021-10-23 NOTE — Assessment & Plan Note (Signed)
Potassium 2.8, magnesium 1.7 -Repleted potassium

## 2021-10-23 NOTE — ED Notes (Signed)
Pt transferred to hospital bed, new purewick placed.

## 2021-10-23 NOTE — ED Notes (Signed)
Advised nurse that patient has ready bed 

## 2021-10-23 NOTE — ED Triage Notes (Signed)
Pt to ED via POV with her son, pt states fell out of bed earlier this morning. Pt states unknown if she hit her head. Pt c/o R hip/leg pain. Pt also c/o R ribcage pain from being picked up. Pt presents to ED alert and oriented at this time.

## 2021-10-23 NOTE — H&P (Addendum)
History and Physical    Glenda Williams GGY:[id number] DOB: 1941/01/02 DOA: 10/23/2021  Referring MD/NP/PA:   PCP: Virginia Crews, MD   Patient coming from:  The patient is coming from home.  At baseline, pt is independent for most of ADL.        Chief Complaint: fall and pain in right upper posterior thigh   HPI: Glenda Williams is a 81 y.o. female with medical history significant of A-fib on Xarelto, hypertension, hyperlipidemia, prediabetes, rheumatoid arthritis, Bell's palsy, essential tremor, obesity with BMI 38.45, dCHF, who presents with fall and pain in right upper posterior thigh.  Pt states that she fell accidentally when she was out of bed, going to bathroom at about 3:30 AM.  No loss of consciousness.  She injured her left upper posterior thigh, causing pain which was initially severe, currently mild, sharp, nonradiating.  She has bruise over right upper posterior thigh.  No head or neck injury.  Patient denies chest pain, cough, shortness of breath.  No fever or chills.  Denies nausea, vomiting, diarrhea or abdominal pain.  No symptoms of UTI.  No dark stool or rectal bleeding.  Her last dose of Xarelto was last night.  Patient blood pressure was 132/98 initially, which dropped to 88/47 in ED.  Mental status normal.  Patient was given 1 dose of Kcentra in the ED.  Data reviewed independently and ED Course: pt was found to have hemoglobin 10.7 (11.3 on 08/11/2021), INR 2.4, troponin level 14, BNP 137, negative COVID PCR, GFR> 60, potassium 2.8, magnesium 1.7, temperature normal, heart rate 68, RR 13, oxygen saturation 98% on room air.  CT of head and C-spine negative for acute injury.  Patient placed on PCU for observation.  Dr. Trula Slade of [facility] is consulted.  CT-chest: 1. No acute cardiopulmonary findings. 2. Coronary artery atherosclerosis. 3. Chronic T8 vertebral body compression fracture. 4. Aortic Atherosclerosis (ICD10-I70.0).  CTA of lower extremity: Large hematoma within  the subcutaneous fat of the lateral right thigh. Along the medial aspect of the hematoma there is a pseudoaneurysm, which likely arises off a lateral branch of the right deep femoral artery.   Aortic Atherosclerosis (ICD10-I70.0).   Critical Value/emergent results were called by telephone at the time of interpretation on 10/23/2021 at 10:09 am to provider Dr. Charna Archer, who verbally acknowledged these results.    EKG: I have personally reviewed.  Atrial fibrillation, QTc 415, heart rate 69, LAD, poor R wave progression, mild T wave inversion in V4-V6.  Review of Systems:   General: no fevers, chills, no body weight gain, fatigue HEENT: no blurry vision, hearing changes or sore throat Respiratory: no dyspnea, coughing, wheezing CV: no chest pain, no palpitations GI: no nausea, vomiting, abdominal pain, diarrhea, constipation GU: no dysuria, burning on urination, increased urinary frequency, hematuria  Ext: no leg edema Neuro: no unilateral weakness, numbness, or tingling, no vision change or hearing loss. Has fall Skin: Has bruise over right upper posterior thigh MSK: has pain in right upper posterior thigh Heme: No easy bruising.  Travel history: No recent long distant travel.   Allergy:  Allergies  Allergen Reactions   Bactrim [Sulfamethoxazole-Trimethoprim] Anaphylaxis   Macrobid [Nitrofurantoin Macrocrystal] Rash   Mirabegron Rash   Oxybutynin Rash   Penicillins Rash    Has patient had a PCN reaction causing immediate rash, facial/tongue/throat swelling, SOB or lightheadedness with hypotension: Yes Has patient had a PCN reaction causing severe rash involving mucus membranes or skin necrosis: No Has patient had  a PCN reaction that required hospitalization: No Has patient had a PCN reaction occurring within the last 10 years: Unknown If all of the above answers are "NO", then may proceed with Cephalosporin use.     Past Medical History:  Diagnosis Date   A-fib [facility])     Arthritis    Bell palsy 09/04/2014   GERD (gastroesophageal reflux disease)    Heart disease    Rheumatoid arthritis (HCC)    Thyroid disease    Tremors of nervous system     Past Surgical History:  Procedure Laterality Date   BREAST BIOPSY Left 1988   Benign   BREAST BIOPSY Left 1987   Benign   BREAST EXCISIONAL BIOPSY     BROW LIFT Bilateral 05/23/2016   Procedure: BLEPHAROPLASTY upper eyelid; w/excess skin;  Surgeon: Karle Starch, MD;  Location: [facility];  Service: Ophthalmology;  Laterality: Bilateral;   CARPAL TUNNEL RELEASE Bilateral    CHOLECYSTECTOMY     Dr. Bary Castilla   COLONOSCOPY  2006   JOINT REPLACEMENT Bilateral    knees   REPLACEMENT TOTAL KNEE Left 2005   REPLACEMENT TOTAL KNEE Right 1998    Social History:  reports that she has never smoked. She has never used smokeless tobacco. She reports that she does not drink alcohol and does not use drugs.  Family History:  Family History  Problem Relation Age of Onset   Stroke Mother    Hypertension Mother    Heart disease Father    Arthritis Father    Parkinson's disease Sister    Parkinson's disease Brother    Bladder Cancer Brother    Breast cancer Neg Hx    Kidney cancer Neg Hx      Prior to Admission medications   Medication Sig Start Date End Date Taking? Authorizing Provider  albuterol (VENTOLIN HFA) 108 (90 Base) MCG/ACT inhaler Inhale 2 puffs into the lungs every 6 (six) hours as needed for wheezing or shortness of breath. 08/24/21   Bacigalupo, Dionne Bucy, MD  cetirizine (ZYRTEC) 10 MG tablet Take 10 mg by mouth daily as needed.     [provider]  cholecalciferol (VITAMIN D) 400 units TABS tablet Take 400 Units by mouth daily.    [provider]  conjugated estrogens (PREMARIN) vaginal cream Apply 0.'5mg'$  (pea-sized amount)  just inside the vaginal introitus with a finger-tip on  Monday, Wednesday and Friday nights. 12/28/20   Zara Council A, PA-C  EPINEPHrine 0.3 mg/0.3 mL IJ  SOAJ injection Inject 0.3 mLs into the skin once as needed for anaphylaxis. 11/03/17   [provider]  estradiol (ESTRACE) 0.1 MG/GM vaginal cream Apply 0.5 g to the vaginal introitus on Monday, Wednesday and Friday nights 02/16/21   Zara Council A, PA-C  folic acid (FOLVITE) 1 MG tablet Take 1 mg by mouth daily.    [provider]  gabapentin (NEURONTIN) 100 MG capsule Take 100 mg by mouth 3 (three) times daily. Per patient taking BID 05/19/21   [provider]  hydrocortisone cream 1 % Apply 1 application topically as needed for itching.    [provider]  ketoconazole (NIZORAL) 2 % cream Apply 1 application topically daily. 04/20/21   Virginia Crews, MD  levothyroxine (SYNTHROID) 112 MCG tablet Take 1 tablet (112 mcg total) by mouth daily. 08/23/21   Virginia Crews, MD  methotrexate (RHEUMATREX) 2.5 MG tablet Take 20 mg by mouth every Thursday.  10/25/13   [provider]  Eddie Candle  powder APPLY TOPICALLY TO SKIN FOLDS THREE TIMES DAILY 06/20/21   [name], FNP  Omega-3 Fatty Acids (FISH OIL) 1000 MG CAPS Take 1 capsule by mouth daily.    [provider]  omeprazole (PRILOSEC) 20 MG capsule TAKE 1 CAPSULE BY MOUTH  TWICE DAILY BEFORE MEALS. 08/23/21   Virginia Crews, MD  potassium chloride SA (K-DUR,KLOR-CON) 20 MEQ tablet Take 1 tablet by mouth  daily 03/26/15   Margarita Rana, MD  propranolol (INDERAL) 40 MG tablet 80 mg in the am and 40 mg at night 03/06/19   [provider]  Red Yeast Rice 600 MG CAPS Take 1 capsule by mouth daily.    [provider]  rivaroxaban (XARELTO) 20 MG TABS tablet Take 20 mg by mouth daily with supper. 02/07/16   [provider]  sertraline (ZOLOFT) 50 MG tablet TAKE 3 TABLETS BY MOUTH  DAILY 06/20/21   Virginia Crews, MD    Physical Exam: Vitals:   10/23/21 1255 10/23/21 1256 10/23/21 1300 10/23/21 1305  BP: (!) 88/42 (!) 89/47 (!) 96/40 (!) 90/58  Pulse:  79 60 75 74  Resp: 18 16 (!) 24 10  Temp:      TempSrc:      SpO2: 96% 95% 100% 98%  Weight:      Height:       General: Not in acute distress HEENT:       Eyes: PERRL, EOMI, no scleral icterus.       ENT: No discharge from the ears and nose, no pharynx injection, no tonsillar enlargement.        Neck: No JVD, no bruit, no mass felt. Heme: No neck lymph node enlargement. Cardiac: S1/S2, RRR, No murmurs, No gallops or rubs. Respiratory: No rales, wheezing, rhonchi or rubs. GI: Soft, nondistended, nontender, no rebound pain, no organomegaly, BS present. GU: No hematuria Ext: No pitting leg edema bilaterally. 1+DP/PT pulse bilaterally. Skin: Has bruise over right upper posterior thigh MSK: has tenderness in right upper posterior thigh Neuro: Alert, oriented X3, cranial nerves II-XII grossly intact, moves all extremities normally. Psych: Patient is not psychotic, no suicidal or hemocidal ideation.  Labs on Admission: I have personally reviewed following labs and imaging studies  CBC: Recent Labs  Lab 10/23/21 0703 10/23/21 1309  WBC 10.4 10.8*  NEUTROABS 7.6  --   HGB 10.7* 8.7*  HCT 34.3* 28.3*  MCV 86.2 86.8  PLT 311 672   Basic Metabolic Panel: Recent Labs  Lab 10/23/21 0703  NA 138  K 2.8*  CL 100  CO2 29  GLUCOSE 111*  BUN 19  CREATININE 0.82  CALCIUM 8.9  MG 1.7   GFR: Estimated Creatinine Clearance: 66.8 mL/min (by C-G formula based on SCr of 0.82 mg/dL). Liver Function Tests: No results for input(s): "AST", "ALT", "ALKPHOS", "BILITOT", "PROT", "ALBUMIN" in the last 168 hours. No results for input(s): "LIPASE", "AMYLASE" in the last 168 hours. No results for input(s): "AMMONIA" in the last 168 hours. Coagulation Profile: Recent Labs  Lab 10/23/21 0703  INR 2.4*   Cardiac Enzymes: No results for input(s): "CKTOTAL", "CKMB", "CKMBINDEX", "TROPONINI" in the last 168 hours. BNP (last 3 results) No results for input(s): "PROBNP" in the last 8760  hours. HbA1C: No results for input(s): "HGBA1C" in the last 72 hours. CBG: No results for input(s): "GLUCAP" in the last 168 hours. Lipid Profile: No results for input(s): "CHOL", "HDL", "LDLCALC", "TRIG", "CHOLHDL", "LDLDIRECT" in the last 72 hours. Thyroid Function Tests:  No results for input(s): "TSH", "T4TOTAL", "FREET4", "T3FREE", "THYROIDAB" in the last 72 hours. Anemia Panel: No results for input(s): "VITAMINB12", "FOLATE", "FERRITIN", "TIBC", "IRON", "RETICCTPCT" in the last 72 hours. Urine analysis:    Component Value Date/Time   COLORURINE YELLOW (A) 10/23/2021 1036   APPEARANCEUR CLEAR (A) 10/23/2021 1036   APPEARANCEUR Cloudy (A) 01/20/2021 0902   LABSPEC 1.026 10/23/2021 1036   PHURINE 5.0 10/23/2021 1036   GLUCOSEU NEGATIVE 10/23/2021 1036   HGBUR SMALL (A) 10/23/2021 1036   BILIRUBINUR NEGATIVE 10/23/2021 1036   BILIRUBINUR Negative 01/20/2021 0902   KETONESUR NEGATIVE 10/23/2021 1036   PROTEINUR NEGATIVE 10/23/2021 1036   UROBILINOGEN >=8.0 (A) 11/11/2020 1334   NITRITE NEGATIVE 10/23/2021 1036   LEUKOCYTESUR TRACE (A) 10/23/2021 1036   Sepsis Labs: '@LABRCNTIP'$ (procalcitonin:4,lacticidven:4) ) Recent Results (from the past 240 hour(s))  SARS Coronavirus 2 by RT PCR (hospital order, performed in [place] hospital lab) *cepheid single result test* Anterior Nasal Swab     Status: None   Collection Time: 10/23/21  7:03 AM   Specimen: Anterior Nasal Swab  Result Value Ref Range Status   SARS Coronavirus 2 by RT PCR NEGATIVE NEGATIVE Final    Comment: (NOTE) SARS-CoV-2 target nucleic acids are NOT DETECTED.  The SARS-CoV-2 RNA is generally detectable in upper and lower respiratory specimens during the acute phase of infection. The lowest concentration of SARS-CoV-2 viral copies this assay can detect is 250 copies / mL. A negative result does not preclude SARS-CoV-2 infection and should not be used as the sole basis for treatment or other patient management  decisions.  A negative result may occur with improper specimen collection / handling, submission of specimen other than nasopharyngeal swab, presence of viral mutation(s) within the areas targeted by this assay, and inadequate number of viral copies (<250 copies / mL). A negative result must be combined with clinical observations, patient history, and epidemiological information.  Fact Sheet for Patients:   https://www.patel.info/  Fact Sheet for Healthcare Providers: https://hall.com/  This test is not yet approved or  cleared by the [place] FDA and has been authorized for detection and/or diagnosis of SARS-CoV-2 by FDA under an Emergency Use Authorization (EUA).  This EUA will remain in effect (meaning this test can be used) for the duration of the COVID-19 declaration under Section 564(b)(1) of the Act, 21 U.S.C. section 360bbb-3(b)(1), unless the authorization is terminated or revoked sooner.  Performed at [facility], [address] [place], [place] [address]      Radiological Exams on Admission: CT ANGIO LOW EXTREM RIGHT W &/OR WO CONTRAST  Result Date: 10/23/2021 CLINICAL DATA:  Enlarging right lower extremity hematoma. Evaluate for active extravasation. EXAM: CT ANGIOGRAPHY OF THE BILATERAL LOWEREXTREMITY TECHNIQUE: Multidetector CT imaging of the BILATERAL LOWERwas performed using the standard protocol during bolus administration of intravenous contrast. Multiplanar CT image reconstructions and MIPs were obtained to evaluate the vascular anatomy. RADIATION DOSE REDUCTION: This exam was performed according to the departmental dose-optimization program which includes automated exposure control, adjustment of the mA and/or kV according to patient size and/or use of iterative reconstruction technique. CONTRAST:  112m OMNIPAQUE IOHEXOL 350 MG/ML SOLN COMPARISON:  None Available. FINDINGS: Imaging was performed during the  arterial phase of contrast injection from the level of the distal abdominal aorta to just below the bifurcation of the popliteal arteries. Atherosclerotic calcifications are noted involving the distal abdominal aorta and proximal common iliac arteries. There is no sign of significant stenosis involving the aorta, bilateral iliac  arteries, bilateral common femoral arteries, bilateral popliteal arteries, or the bilateral proximal branches of the popliteal arteries.The deep femoral arteries are patent bilaterally. Within the subcutaneous fat of the lateral right thigh there is a heterogeneous mass compatible with hematoma (partially excluded from the field of view laterally). The visualized portions measures 7.1 x 6.2 by 14.2 cm, image 156/5 and image 102/6. Along the medial aspect of the hematoma there is a focal pseudoaneurysm, image 139/5, which most likely reflects active arterial phase extravasation from a lateral branch off the right deep femoral artery. Incidental Findings: Status post bilateral total knee arthroplasty. Fat containing umbilical hernia noted. Sigmoid diverticulosis without signs acute diverticulitis. Advanced lumbar degenerative disc disease. Review of the MIP images confirms the above findings. IMPRESSION: Large hematoma within the subcutaneous fat of the lateral right thigh. Along the medial aspect of the hematoma there is a pseudoaneurysm, which likely arises off a lateral branch of the right deep femoral artery. Aortic Atherosclerosis (ICD10-I70.0). Critical Value/emergent results were called by telephone at the time of interpretation on 10/23/2021 at 10:09 am to provider Dr. Charna Archer, who verbally acknowledged these results. Electronically Signed   By: Kerby Moors M.D.   On: 10/23/2021 10:10   CT ANGIO LOW EXTREM LEFT W &/OR WO CONTRAST  Result Date: 10/23/2021 CLINICAL DATA:  Enlarging right lower extremity hematoma. Evaluate for active extravasation. EXAM: CT ANGIOGRAPHY OF THE  BILATERAL LOWEREXTREMITY TECHNIQUE: Multidetector CT imaging of the BILATERAL LOWERwas performed using the standard protocol during bolus administration of intravenous contrast. Multiplanar CT image reconstructions and MIPs were obtained to evaluate the vascular anatomy. RADIATION DOSE REDUCTION: This exam was performed according to the departmental dose-optimization program which includes automated exposure control, adjustment of the mA and/or kV according to patient size and/or use of iterative reconstruction technique. CONTRAST:  172m OMNIPAQUE IOHEXOL 350 MG/ML SOLN COMPARISON:  None Available. FINDINGS: Imaging was performed during the arterial phase of contrast injection from the level of the distal abdominal aorta to just below the bifurcation of the popliteal arteries. Atherosclerotic calcifications are noted involving the distal abdominal aorta and proximal common iliac arteries. There is no sign of significant stenosis involving the aorta, bilateral iliac arteries, bilateral common femoral arteries, bilateral popliteal arteries, or the bilateral proximal branches of the popliteal arteries.The deep femoral arteries are patent bilaterally. Within the subcutaneous fat of the lateral right thigh there is a heterogeneous mass compatible with hematoma (partially excluded from the field of view laterally). The visualized portions measures 7.1 x 6.2 by 14.2 cm, image 156/5 and image 102/6. Along the medial aspect of the hematoma there is a focal pseudoaneurysm, image 139/5, which most likely reflects active arterial phase extravasation from a lateral branch off the right deep femoral artery. Incidental Findings: Status post bilateral total knee arthroplasty. Fat containing umbilical hernia noted. Sigmoid diverticulosis without signs acute diverticulitis. Advanced lumbar degenerative disc disease. Review of the MIP images confirms the above findings. IMPRESSION: Large hematoma within the subcutaneous fat of the  lateral right thigh. Along the medial aspect of the hematoma there is a pseudoaneurysm, which likely arises off a lateral branch of the right deep femoral artery. Aortic Atherosclerosis (ICD10-I70.0). Critical Value/emergent results were called by telephone at the time of interpretation on 10/23/2021 at 10:09 am to provider Dr. JCharna Archer who verbally acknowledged these results. Electronically Signed   By: TKerby MoorsM.D.   On: 10/23/2021 10:10   CT Chest W Contrast  Result Date: 10/23/2021 CLINICAL DATA:  Pneumonia, lower extremity vascular trauma.  EXAM: CT CHEST WITH CONTRAST TECHNIQUE: Multidetector CT imaging of the chest was performed during intravenous contrast administration. RADIATION DOSE REDUCTION: This exam was performed according to the departmental dose-optimization program which includes automated exposure control, adjustment of the mA and/or kV according to patient size and/or use of iterative reconstruction technique. CONTRAST:  152m OMNIPAQUE IOHEXOL 350 MG/ML SOLN COMPARISON:  None Available. FINDINGS: Cardiovascular: No significant vascular findings. Normal heart size. No pericardial effusion. Coronary artery atherosclerosis. Thoracic aortic atherosclerosis. Mediastinum/Nodes: No enlarged mediastinal, hilar, or axillary lymph nodes. Thyroid gland, trachea, and esophagus demonstrate no significant findings. Lungs/Pleura: Lungs are clear. No pleural effusion or pneumothorax. Upper Abdomen: No acute abnormality. Musculoskeletal: No acute osseous abnormality. No aggressive osseous lesion. Chronic T8 vertebral body compression fracture. Anterior bridging osteophytes from T6 through L1 as can be seen with diffuse idiopathic skeletal hyperostosis. IMPRESSION: 1. No acute cardiopulmonary findings. 2. Coronary artery atherosclerosis. 3. Chronic T8 vertebral body compression fracture. 4. Aortic Atherosclerosis (ICD10-I70.0). Electronically Signed   By: HKathreen DevoidM.D.   On: 10/23/2021 09:43   CT  HEAD WO CONTRAST (5MM)  Result Date: 10/23/2021 CLINICAL DATA:  Minor head trauma.  Fall from bed EXAM: CT HEAD WITHOUT CONTRAST CT CERVICAL SPINE WITHOUT CONTRAST TECHNIQUE: Multidetector CT imaging of the head and cervical spine was performed following the standard protocol without intravenous contrast. Multiplanar CT image reconstructions of the cervical spine were also generated. RADIATION DOSE REDUCTION: This exam was performed according to the departmental dose-optimization program which includes automated exposure control, adjustment of the mA and/or kV according to patient size and/or use of iterative reconstruction technique. COMPARISON:  09/13/2006 head CT FINDINGS: CT HEAD FINDINGS Brain: No evidence of swelling, infarction, hemorrhage, hydrocephalus, extra-axial collection or mass lesion/mass effect. Vascular: No hyperdense vessel or unexpected calcification. Skull: Negative for fracture Sinuses/Orbits: No evidence of injury CT CERVICAL SPINE FINDINGS Alignment: No traumatic malalignment Skull base and vertebrae: No acute fracture.  Generalized osteopenia Soft tissues and spinal canal: No prevertebral fluid or swelling. No visible canal hematoma. Disc levels:  Ordinary degenerative changes Upper chest: No acute finding IMPRESSION: No evidence of acute intracranial or cervical spine injury. Electronically Signed   By: JJorje GuildM.D.   On: 10/23/2021 07:00   CT Cervical Spine Wo Contrast  Result Date: 10/23/2021 CLINICAL DATA:  Minor head trauma.  Fall from bed EXAM: CT HEAD WITHOUT CONTRAST CT CERVICAL SPINE WITHOUT CONTRAST TECHNIQUE: Multidetector CT imaging of the head and cervical spine was performed following the standard protocol without intravenous contrast. Multiplanar CT image reconstructions of the cervical spine were also generated. RADIATION DOSE REDUCTION: This exam was performed according to the departmental dose-optimization program which includes automated exposure control,  adjustment of the mA and/or kV according to patient size and/or use of iterative reconstruction technique. COMPARISON:  09/13/2006 head CT FINDINGS: CT HEAD FINDINGS Brain: No evidence of swelling, infarction, hemorrhage, hydrocephalus, extra-axial collection or mass lesion/mass effect. Vascular: No hyperdense vessel or unexpected calcification. Skull: Negative for fracture Sinuses/Orbits: No evidence of injury CT CERVICAL SPINE FINDINGS Alignment: No traumatic malalignment Skull base and vertebrae: No acute fracture.  Generalized osteopenia Soft tissues and spinal canal: No prevertebral fluid or swelling. No visible canal hematoma. Disc levels:  Ordinary degenerative changes Upper chest: No acute finding IMPRESSION: No evidence of acute intracranial or cervical spine injury. Electronically Signed   By: JJorje GuildM.D.   On: 10/23/2021 07:00   DG Chest 2 View  Result Date: 10/23/2021 CLINICAL DATA:  Status post fall.  Complains of right rib cage pain EXAM: CHEST - 2 VIEW COMPARISON:  CT 11/04/2017 FINDINGS: Cardiac enlargement. Aortic atherosclerosis. No pleural effusion or edema. No airspace opacities identified. There is an a airspace opacity on the lateral projection radiograph, likely right middle lobe.Spondylosis identified within the thoracic spine. Unchanged compression fracture within the midthoracic spine. IMPRESSION: 1. Right middle lobe airspace opacity noted on the lateral radiograph concerning for pneumonia. In the absence of any signs or symptoms of infection consider further evaluation with CT of the chest for more definitive characterization and to rule out underlying mass lesion. 2. Cardiac enlargement and aortic atherosclerosis. Electronically Signed   By: Kerby Moors M.D.   On: 10/23/2021 06:47   DG Sternum  Result Date: 10/23/2021 CLINICAL DATA:  Chest pain after assistance off the ground. EXAM: STERNUM - 1 VIEW COMPARISON:  Chest CT 11/04/2017 FINDINGS: There is no evidence of  fracture or other focal bone lesions. Subjective osteopenia. IMPRESSION: No evidence of sternal fracture in the lateral projection. Electronically Signed   By: Jorje Guild M.D.   On: 10/23/2021 06:47   DG Femur Min 2 Views Right  Result Date: 10/23/2021 CLINICAL DATA:  Right hip and leg pain after fall from bed EXAM: RIGHT FEMUR 2 VIEWS COMPARISON:  Knee radiograph 12/21/2015 FINDINGS: No evidence of fracture. No hip or knee dislocation. Generalized osteopenia. 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 IMPRESSION: No acute finding. Electronically Signed   By: Jorje Guild M.D.   On: 10/23/2021 06:45   DG Hip Unilat W or Wo Pelvis 2-3 Views Right  Result Date: 10/23/2021 CLINICAL DATA:  Fall with right hip pain EXAM: DG HIP (WITH OR WITHOUT PELVIS) 3V RIGHT COMPARISON:  None Available. FINDINGS: No visible hip fracture. No hip dislocation. Ring of osteophytes at the bilateral hip joint. Trochanteric and iliac osteophytes. Generalized osteopenia. IMPRESSION: No acute finding. Electronically Signed   By: Jorje Guild M.D.   On: 10/23/2021 06:44      Assessment/Plan Principal Problem:   Hematoma of right thigh Active Problems:   Acute blood loss anemia   Fall at home, initial encounter   Hypokalemia   Rheumatoid arthritis ([place])   Depression with anxiety   Adult hypothyroidism   Obesity with body mass index (BMI) of 30.0 to 39.9   Compression fracture of body of thoracic vertebra_T8, chronic   Benign essential tremor   HTN (hypertension)   Chronic diastolic CHF (congestive heart failure) (HCC)   Assessment and Plan: * Hematoma of right thigh Hematoma of thigh and acute blood loss anemia: CTA showed a large hematoma within the subcutaneous fat of the lateral right thigh. Along the medial aspect of the hematoma there is a pseudoaneurysm, which likely arises off a lateral branch of the  right deep femoral artery. Hgb 11.3 --> 10.7, with soft Bp. Consulted Dr .Trula Slade of [facility].  -will place in PCU for obs -Patient received 1 dose of Kcentra in ED -2 Large born IV  -INR/PTT/type screen -check CBC q6h. Goal of transfusion is Hgb< 7.0 -hold off Xarelto -IV fluid: 500 cc normal saline, followed by 75 cc/h  Addendum: hgb dropped further 11.3 --> 10.7 --> 8.7.   Acute blood loss anemia -see above  Fall at home, initial encounter -fall precaution -pt/ot tomorrow when able to  Hypokalemia Potassium 2.8, magnesium 1.7 -Repleted potassium  Rheumatoid arthritis ([place]) -Patient is on methotrexate weekly  Depression with anxiety - Continue home medications  Adult hypothyroidism - Synthroid  Obesity with body mass index (BMI) of 30.0 to 39.9  BMI= 38.45  and BW= 104.8 -Diet and exercise.   -Encourage to lose weight.   Compression fracture of body of thoracic vertebra_T8, chronic - As needed Tylenol and Percocet pain  Benign essential tremor - Hold propranolol due to softer blood pressure  HTN (hypertension) - Hold HCTZ and propranolol due to softer blood pressure -We hydralazine as needed  Chronic diastolic CHF (congestive heart failure) ([place]) 2D echo on 11/04/2017 showed EF of 55-60% with grade 1 diastolic dysfunction.  Patient does not have leg edema.  No shortness breath.  No O2 desaturation.  dCHF is compensated. -Watch volume status closely          DVT ppx: SCD only apply to left leg  Code Status: Full code  Family Communication:    Yes, patient's  daughter  at bed side.   Disposition Plan:  Anticipate discharge back to previous environment  Consults called:  Dr. Trula Slade of [facility]  Admission status and Level of care: Progressive:   for obs      Dispo: The patient is from: Home              Anticipated d/c is to: Home              Anticipated d/c date is: 1 day              Patient currently is not medically stable to d/c.    Severity  of Illness:  The appropriate patient status for this patient is OBSERVATION. Observation status is judged to be reasonable and necessary in order to provide the required intensity of service to ensure the patient's safety. The patient's presenting symptoms, physical exam findings, and initial radiographic and laboratory data in the context of their medical condition is felt to place them at decreased risk for further clinical deterioration. Furthermore, it is anticipated that the patient will be medically stable for discharge from the hospital within 2 midnights of admission.        Date of Service 10/23/2021    [facility]   If 7PM-7AM, please contact night-coverage [email or web address] 10/23/2021, 1:35 PM

## 2021-10-23 NOTE — Assessment & Plan Note (Signed)
-  Patient is on methotrexate weekly

## 2021-10-23 NOTE — ED Notes (Signed)
MD messaged about pt bp. Awaiting orders

## 2021-10-24 DIAGNOSIS — E876 Hypokalemia: Secondary | ICD-10-CM | POA: Diagnosis present

## 2021-10-24 DIAGNOSIS — G8929 Other chronic pain: Secondary | ICD-10-CM | POA: Diagnosis present

## 2021-10-24 DIAGNOSIS — Z79890 Hormone replacement therapy (postmenopausal): Secondary | ICD-10-CM | POA: Diagnosis not present

## 2021-10-24 DIAGNOSIS — E039 Hypothyroidism, unspecified: Secondary | ICD-10-CM | POA: Diagnosis present

## 2021-10-24 DIAGNOSIS — I5032 Chronic diastolic (congestive) heart failure: Secondary | ICD-10-CM | POA: Diagnosis present

## 2021-10-24 DIAGNOSIS — T148XXA Other injury of unspecified body region, initial encounter: Secondary | ICD-10-CM | POA: Diagnosis present

## 2021-10-24 DIAGNOSIS — I700 Atherosclerosis of aorta: Secondary | ICD-10-CM | POA: Diagnosis present

## 2021-10-24 DIAGNOSIS — M25551 Pain in right hip: Secondary | ICD-10-CM | POA: Diagnosis present

## 2021-10-24 DIAGNOSIS — E785 Hyperlipidemia, unspecified: Secondary | ICD-10-CM | POA: Diagnosis present

## 2021-10-24 DIAGNOSIS — Z20822 Contact with and (suspected) exposure to covid-19: Secondary | ICD-10-CM | POA: Diagnosis present

## 2021-10-24 DIAGNOSIS — D62 Acute posthemorrhagic anemia: Secondary | ICD-10-CM | POA: Diagnosis present

## 2021-10-24 DIAGNOSIS — W06XXXA Fall from bed, initial encounter: Secondary | ICD-10-CM | POA: Diagnosis present

## 2021-10-24 DIAGNOSIS — F329 Major depressive disorder, single episode, unspecified: Secondary | ICD-10-CM | POA: Diagnosis present

## 2021-10-24 DIAGNOSIS — S7011XA Contusion of right thigh, initial encounter: Secondary | ICD-10-CM | POA: Diagnosis present

## 2021-10-24 DIAGNOSIS — Z6841 Body Mass Index (BMI) 40.0 and over, adult: Secondary | ICD-10-CM | POA: Diagnosis not present

## 2021-10-24 DIAGNOSIS — Z880 Allergy status to penicillin: Secondary | ICD-10-CM | POA: Diagnosis not present

## 2021-10-24 DIAGNOSIS — I2510 Atherosclerotic heart disease of native coronary artery without angina pectoris: Secondary | ICD-10-CM | POA: Diagnosis present

## 2021-10-24 DIAGNOSIS — I480 Paroxysmal atrial fibrillation: Secondary | ICD-10-CM | POA: Diagnosis present

## 2021-10-24 DIAGNOSIS — I110 Hypertensive heart disease with heart failure: Secondary | ICD-10-CM | POA: Diagnosis present

## 2021-10-24 DIAGNOSIS — I724 Aneurysm of artery of lower extremity: Secondary | ICD-10-CM | POA: Diagnosis present

## 2021-10-24 DIAGNOSIS — Z888 Allergy status to other drugs, medicaments and biological substances status: Secondary | ICD-10-CM | POA: Diagnosis not present

## 2021-10-24 DIAGNOSIS — Z8249 Family history of ischemic heart disease and other diseases of the circulatory system: Secondary | ICD-10-CM | POA: Diagnosis not present

## 2021-10-24 DIAGNOSIS — G510 Bell's palsy: Secondary | ICD-10-CM | POA: Diagnosis present

## 2021-10-24 DIAGNOSIS — Y92009 Unspecified place in unspecified non-institutional (private) residence as the place of occurrence of the external cause: Secondary | ICD-10-CM | POA: Diagnosis not present

## 2021-10-24 DIAGNOSIS — M4854XA Collapsed vertebra, not elsewhere classified, thoracic region, initial encounter for fracture: Secondary | ICD-10-CM | POA: Diagnosis present

## 2021-10-24 DIAGNOSIS — M069 Rheumatoid arthritis, unspecified: Secondary | ICD-10-CM | POA: Diagnosis present

## 2021-10-24 DIAGNOSIS — R7303 Prediabetes: Secondary | ICD-10-CM | POA: Diagnosis present

## 2021-10-24 LAB — CBC
HCT: 27.2 % — ABNORMAL LOW (ref 36.0–46.0)
Hemoglobin: 8.4 g/dL — ABNORMAL LOW (ref 12.0–15.0)
MCH: 27.1 pg (ref 26.0–34.0)
MCHC: 30.9 g/dL (ref 30.0–36.0)
MCV: 87.7 fL (ref 80.0–100.0)
Platelets: 260 10*3/uL (ref 150–400)
RBC: 3.1 MIL/uL — ABNORMAL LOW (ref 3.87–5.11)
RDW: 15.4 % (ref 11.5–15.5)
WBC: 9.1 10*3/uL (ref 4.0–10.5)
nRBC: 0 % (ref 0.0–0.2)

## 2021-10-24 LAB — BASIC METABOLIC PANEL
Anion gap: 5 (ref 5–15)
BUN: 15 mg/dL (ref 8–23)
CO2: 30 mmol/L (ref 22–32)
Calcium: 8 mg/dL — ABNORMAL LOW (ref 8.9–10.3)
Chloride: 103 mmol/L (ref 98–111)
Creatinine, Ser: 0.77 mg/dL (ref 0.44–1.00)
GFR, Estimated: >60 mL/min (ref >60)
Glucose, Bld: 100 mg/dL — ABNORMAL HIGH (ref 70–99)
Potassium: 3.9 mmol/L (ref 3.5–5.1)
Sodium: 138 mmol/L (ref 135–145)

## 2021-10-24 LAB — GLUCOSE, CAPILLARY
Glucose-Capillary: 91 mg/dL (ref 70–99)
Glucose-Capillary: 154 mg/dL — ABNORMAL HIGH (ref 70–99)
Glucose-Capillary: 94 mg/dL (ref 70–99)

## 2021-10-24 LAB — HEMOGLOBIN: Hemoglobin: 8.3 g/dL — ABNORMAL LOW (ref 12.0–15.0)

## 2021-10-24 MED ORDER — SODIUM CHLORIDE 0.9 % IV SOLN
300.0000 mg | Freq: Once | INTRAVENOUS | Status: AC
  Administered 2021-10-24: 300 mg via INTRAVENOUS
  Filled 2021-10-24: qty 300

## 2021-10-24 MED ORDER — TRANEXAMIC ACID-NACL 1000-0.7 MG/100ML-% IV SOLN
1000.0000 mg | Freq: Once | INTRAVENOUS | Status: AC
Start: 2021-10-24 — End: 2021-10-24
  Administered 2021-10-24: 1000 mg via INTRAVENOUS
  Filled 2021-10-24: qty 100

## 2021-10-24 NOTE — Plan of Care (Signed)

## 2021-10-24 NOTE — Progress Notes (Signed)
OT Cancellation Note  Patient Details Name: Glenda Williams MRN: [id number] DOB: 07/11/1940   Cancelled Treatment:    Reason Eval/Treat Not Completed: Active bedrest order. Orders received and chart reviewed. Per chart and conversation with MD, pt on bed rest, request to hold this date. Will continue to follow and initiate services as appropriate.   Dessie Coma, M.S. OTR/L  10/24/21, 10:40 AM  ascom [phone]

## 2021-10-24 NOTE — Progress Notes (Signed)
[facility]  Daily Progress Note   Subjective  -   Visit conducted jointly with Dr. Lucky Cowboy.  Patient currently resting in bed eating lunch with family at bedside.  Objective Vitals:   10/24/21 1200 10/24/21 1616 10/24/21 2005 10/24/21 2350  BP: (!) 115/54 (!) 113/33 (!) 125/43 99/68  Pulse: (!) 57 68 60 68  Resp: '18 20 18 20  '$ Temp: 98.2 F (36.8 C) (!) 97.5 F (36.4 C) 99 F (37.2 C) 97.6 F (36.4 C)  TempSrc: Oral Oral    SpO2: 100% 98% 97% 96%  Weight:      Height:        Intake/Output Summary (Last 24 hours) at 10/24/2021 2355 Last data filed at 10/24/2021 2300 Gross per 24 hour  Intake 1560 ml  Output 2800 ml  Net -1240 ml    PULM  CTAB CV  RRR VASC  evidence of hematoma on right side hip area  Laboratory CBC    Component Value Date/Time   WBC 9.1 10/24/2021 0438   HGB 8.3 (L) 10/24/2021 1340   HGB 11.3 08/11/2021 1058   HCT 27.2 (L) 10/24/2021 0438   HCT 35.7 08/11/2021 1058   PLT 260 10/24/2021 0438   PLT 259 08/11/2021 1058    BMET    Component Value Date/Time   NA 138 10/24/2021 0438   NA 140 08/11/2021 1058   NA 142 02/13/2014 0356   K 3.9 10/24/2021 0438   K 3.8 02/13/2014 0356   CL 103 10/24/2021 0438   CL 107 02/13/2014 0356   CO2 30 10/24/2021 0438   CO2 29 02/13/2014 0356   GLUCOSE 100 (H) 10/24/2021 0438   GLUCOSE 92 02/13/2014 0356   BUN 15 10/24/2021 0438   BUN 15 08/11/2021 1058   BUN 14 02/13/2014 0356   CREATININE 0.77 10/24/2021 0438   CREATININE 0.77 02/13/2014 0356   CALCIUM 8.0 (L) 10/24/2021 0438   CALCIUM 8.6 02/13/2014 0356   GFRNONAA >60 10/24/2021 0438   GFRNONAA >60 02/13/2014 0356   GFRAA 77 11/18/2019 1104   GFRAA >60 02/13/2014 0356    Assessment/Planning: Currently the patient's hemoglobin is stable.  We will continue to follow to ensure that hemoglobin remained stable or begins to trend upward.  In that instance, embolization of a branch of the profunda artery will likely be unnecessary  as the bleeding has continued itself.  If the patient's hemoglobin begins to drop or there is concerns of acute blood loss we will readdress the idea of angiogram.   Kris Hartmann  10/24/2021, 11:55 PM

## 2021-10-24 NOTE — Progress Notes (Signed)
PT Cancellation Note  Patient Details Name: ALEGRIA DOMINIQUE MRN: [id number] DOB: [birth year]   Cancelled Treatment:    Reason Eval/Treat Not Completed: Other (comment). Active bed rest order clarified with care team. Advised to hold this date. Will attempt when medically stable.   Danielle Mink 10/24/2021, 12:12 PM Greggory Stallion, PT, DPT, G[facility] [phone]

## 2021-10-24 NOTE — Progress Notes (Addendum)
PROGRESS NOTE    Glenda Williams  QIH:[id number] DOB: [birth year] DOA: 10/23/2021 PCP: Virginia Crews, MD  Outpatient Specialists: rheumatology, cardiology    Brief Narrative:   From admission h and p Glenda Williams is a 81 y.o. female with medical history significant of A-fib on Xarelto, hypertension, hyperlipidemia, prediabetes, rheumatoid arthritis, Bell's palsy, essential tremor, obesity with BMI 38.45, dCHF, who presents with fall and pain in right upper posterior thigh.   Pt states that she fell accidentally when she was out of bed, going to bathroom at about 3:30 AM.  No loss of consciousness.  She injured her left upper posterior thigh, causing pain which was initially severe, currently mild, sharp, nonradiating.  She has bruise over right upper posterior thigh.  No head or neck injury.  Patient denies chest pain, cough, shortness of breath.  No fever or chills.  Denies nausea, vomiting, diarrhea or abdominal pain.  No symptoms of UTI.  No dark stool or rectal bleeding.  Her last dose of Xarelto was last night.   Patient blood pressure was 132/98 initially, which dropped to 88/47 in ED.  Mental status normal.  Patient was given 1 dose of Kcentra in the ED.   Assessment & Plan:   Principal Problem:   Hematoma of right thigh Active Problems:   Acute blood loss anemia   Fall at home, initial encounter   Hypokalemia   Rheumatoid arthritis ([place])   Depression with anxiety   Adult hypothyroidism   Obesity with body mass index (BMI) of 30.0 to 39.9   Compression fracture of body of thoracic vertebra_T8, chronic   Benign essential tremor   HTN (hypertension)   Atrial fibrillation (HCC)   Morbid obesity (HCC)   Hypertension   Chronic diastolic CHF (congestive heart failure) ([place])  # Hematoma right thigh  # Acute blood loss anemia CTA with large hematoma subcu right lateral thigh with pseudoaneurysm. Hgb 11.3>8s. received [place]. Does not appear to have compartment  syndrome - will give a dose of txa - trend hgb - vascular following, attempting conservative mgmt - holding xarelto - will give 300 of venofer, can repeat in 24-48 hours - bed rest for now per vascular  # Fall Imaging of head, neck, leg, and pelvis all negative for fracture  # RA Followed by rheum - cont home methotrexate, folic acid  # Chronic pain - home gabapentin  # MDD - home sertraline  # A-fib No rate/rhythm meds at home. Here rate controlled - hold home xarelto  # Hypothyroid - home levothyroxoine  # HFpEF Appears compensated   # Obesity Noted   # Tremor - holding propranolol for now  # HTN Here bp soft - hold home propran, hctz for now      DVT prophylaxis: SCDs Code Status: full Family Communication: son and husband updated @ bedside 8/21  Level of care: Progressive Status is: Observation, will discuss with UR    Consultants:  vascular  Procedures: none  Antimicrobials:  none    Subjective: Mild thigh pain  Objective: Vitals:   10/23/21 2309 10/24/21 0405 10/24/21 0431 10/24/21 0840  BP: (!) 101/46 (!) 106/46  (!) 116/53  Pulse: (!) 54 62  (!) 53  Resp: '14 14  18  '$ Temp: 98 F (36.7 C) (!) 97.3 F (36.3 C)  98.2 F (36.8 C)  TempSrc: Oral Oral  Oral  SpO2: 98% 94%  99%  Weight:   113.9 kg   Height:  Intake/Output Summary (Last 24 hours) at 10/24/2021 1038 Last data filed at 10/24/2021 1029 Gross per 24 hour  Intake 1794.45 ml  Output 550 ml  Net 1244.45 ml   Filed Weights   10/23/21 0526 10/24/21 0431  Weight: 104.8 kg 113.9 kg    Examination:  General exam: Appears calm and comfortable  Respiratory system: Clear to auscultation. Respiratory effort normal. Cardiovascular system: S1 & S2 heard, RRR. No JVD, murmurs, rubs, gallops or clicks. No pedal edema. Gastrointestinal system: Abdomen is nondistended, soft and nontender. No organomegaly or masses felt. Normal bowel sounds heard. Central nervous system:  Alert and oriented. No focal neurological deficits. Distal sensation intact Extremities: Symmetric 5 x 5 power. Skin: hematoma right posterior thigh Psychiatry: Judgement and insight appear normal. Mood & affect appropriate.     Data Reviewed: I have personally reviewed following labs and imaging studies  CBC: Recent Labs  Lab 10/23/21 0703 10/23/21 1309 10/23/21 1604 10/24/21 0438  WBC 10.4 10.8* 10.0 9.1  NEUTROABS 7.6  --   --   --   HGB 10.7* 8.7* 8.5* 8.4*  HCT 34.3* 28.3* 27.9* 27.2*  MCV 86.2 86.8 87.5 87.7  PLT 311 275 267 629   Basic Metabolic Panel: Recent Labs  Lab 10/23/21 0703 10/24/21 0438  NA 138 138  K 2.8* 3.9  CL 100 103  CO2 29 30  GLUCOSE 111* 100*  BUN 19 15  CREATININE 0.82 0.77  CALCIUM 8.9 8.0*  MG 1.7  --    GFR: Estimated Creatinine Clearance: 71.8 mL/min (by C-G formula based on SCr of 0.77 mg/dL). Liver Function Tests: No results for input(s): "AST", "ALT", "ALKPHOS", "BILITOT", "PROT", "ALBUMIN" in the last 168 hours. No results for input(s): "LIPASE", "AMYLASE" in the last 168 hours. No results for input(s): "AMMONIA" in the last 168 hours. Coagulation Profile: Recent Labs  Lab 10/23/21 0703  INR 2.4*   Cardiac Enzymes: No results for input(s): "CKTOTAL", "CKMB", "CKMBINDEX", "TROPONINI" in the last 168 hours. BNP (last 3 results) No results for input(s): "PROBNP" in the last 8760 hours. HbA1C: No results for input(s): "HGBA1C" in the last 72 hours. CBG: Recent Labs  Lab 10/24/21 0843  GLUCAP 91   Lipid Profile: No results for input(s): "CHOL", "HDL", "LDLCALC", "TRIG", "CHOLHDL", "LDLDIRECT" in the last 72 hours. Thyroid Function Tests: No results for input(s): "TSH", "T4TOTAL", "FREET4", "T3FREE", "THYROIDAB" in the last 72 hours. Anemia Panel: No results for input(s): "VITAMINB12", "FOLATE", "FERRITIN", "TIBC", "IRON", "RETICCTPCT" in the last 72 hours. Urine analysis:    Component Value Date/Time   COLORURINE  YELLOW (A) 10/23/2021 1036   APPEARANCEUR CLEAR (A) 10/23/2021 1036   APPEARANCEUR Cloudy (A) 01/20/2021 0902   LABSPEC 1.026 10/23/2021 1036   PHURINE 5.0 10/23/2021 1036   GLUCOSEU NEGATIVE 10/23/2021 1036   HGBUR SMALL (A) 10/23/2021 1036   BILIRUBINUR NEGATIVE 10/23/2021 1036   BILIRUBINUR Negative 01/20/2021 0902   KETONESUR NEGATIVE 10/23/2021 1036   PROTEINUR NEGATIVE 10/23/2021 1036   UROBILINOGEN >=8.0 (A) 11/11/2020 1334   NITRITE NEGATIVE 10/23/2021 1036   LEUKOCYTESUR TRACE (A) 10/23/2021 1036   Sepsis Labs: '@LABRCNTIP'$ (procalcitonin:4,lacticidven:4)  ) Recent Results (from the past 240 hour(s))  SARS Coronavirus 2 by RT PCR (hospital order, performed in [place] hospital lab) *cepheid single result test* Anterior Nasal Swab     Status: None   Collection Time: 10/23/21  7:03 AM   Specimen: Anterior Nasal Swab  Result Value Ref Range Status   SARS Coronavirus 2 by RT PCR NEGATIVE NEGATIVE  Final    Comment: (NOTE) SARS-CoV-2 target nucleic acids are NOT DETECTED.  The SARS-CoV-2 RNA is generally detectable in upper and lower respiratory specimens during the acute phase of infection. The lowest concentration of SARS-CoV-2 viral copies this assay can detect is 250 copies / mL. A negative result does not preclude SARS-CoV-2 infection and should not be used as the sole basis for treatment or other patient management decisions.  A negative result may occur with improper specimen collection / handling, submission of specimen other than nasopharyngeal swab, presence of viral mutation(s) within the areas targeted by this assay, and inadequate number of viral copies (<250 copies / mL). A negative result must be combined with clinical observations, patient history, and epidemiological information.  Fact Sheet for Patients:   https://www.patel.info/  Fact Sheet for Healthcare Providers: https://hall.com/  This test is not yet  approved or  cleared by the [place] FDA and has been authorized for detection and/or diagnosis of SARS-CoV-2 by FDA under an Emergency Use Authorization (EUA).  This EUA will remain in effect (meaning this test can be used) for the duration of the COVID-19 declaration under Section 564(b)(1) of the Act, 21 U.S.C. section 360bbb-3(b)(1), unless the authorization is terminated or revoked sooner.  Performed at [facility], [address], [place], [place] [address]          Radiology Studies: CT ANGIO LOW EXTREM RIGHT W &/OR WO CONTRAST  Result Date: 10/23/2021 CLINICAL DATA:  Enlarging right lower extremity hematoma. Evaluate for active extravasation. EXAM: CT ANGIOGRAPHY OF THE BILATERAL LOWEREXTREMITY TECHNIQUE: Multidetector CT imaging of the BILATERAL LOWERwas performed using the standard protocol during bolus administration of intravenous contrast. Multiplanar CT image reconstructions and MIPs were obtained to evaluate the vascular anatomy. RADIATION DOSE REDUCTION: This exam was performed according to the departmental dose-optimization program which includes automated exposure control, adjustment of the mA and/or kV according to patient size and/or use of iterative reconstruction technique. CONTRAST:  157m OMNIPAQUE IOHEXOL 350 MG/ML SOLN COMPARISON:  None Available. FINDINGS: Imaging was performed during the arterial phase of contrast injection from the level of the distal abdominal aorta to just below the bifurcation of the popliteal arteries. Atherosclerotic calcifications are noted involving the distal abdominal aorta and proximal common iliac arteries. There is no sign of significant stenosis involving the aorta, bilateral iliac arteries, bilateral common femoral arteries, bilateral popliteal arteries, or the bilateral proximal branches of the popliteal arteries.The deep femoral arteries are patent bilaterally. Within the subcutaneous fat of the lateral right thigh  there is a heterogeneous mass compatible with hematoma (partially excluded from the field of view laterally). The visualized portions measures 7.1 x 6.2 by 14.2 cm, image 156/5 and image 102/6. Along the medial aspect of the hematoma there is a focal pseudoaneurysm, image 139/5, which most likely reflects active arterial phase extravasation from a lateral branch off the right deep femoral artery. Incidental Findings: Status post bilateral total knee arthroplasty. Fat containing umbilical hernia noted. Sigmoid diverticulosis without signs acute diverticulitis. Advanced lumbar degenerative disc disease. Review of the MIP images confirms the above findings. IMPRESSION: Large hematoma within the subcutaneous fat of the lateral right thigh. Along the medial aspect of the hematoma there is a pseudoaneurysm, which likely arises off a lateral branch of the right deep femoral artery. Aortic Atherosclerosis (ICD10-I70.0). Critical Value/emergent results were called by telephone at the time of interpretation on 10/23/2021 at 10:09 am to provider Dr. JCharna Archer who verbally acknowledged these results. Electronically Signed   By:  Kerby Moors M.D.   On: 10/23/2021 10:10   CT ANGIO LOW EXTREM LEFT W &/OR WO CONTRAST  Result Date: 10/23/2021 CLINICAL DATA:  Enlarging right lower extremity hematoma. Evaluate for active extravasation. EXAM: CT ANGIOGRAPHY OF THE BILATERAL LOWEREXTREMITY TECHNIQUE: Multidetector CT imaging of the BILATERAL LOWERwas performed using the standard protocol during bolus administration of intravenous contrast. Multiplanar CT image reconstructions and MIPs were obtained to evaluate the vascular anatomy. RADIATION DOSE REDUCTION: This exam was performed according to the departmental dose-optimization program which includes automated exposure control, adjustment of the mA and/or kV according to patient size and/or use of iterative reconstruction technique. CONTRAST:  127m OMNIPAQUE IOHEXOL 350 MG/ML SOLN  COMPARISON:  None Available. FINDINGS: Imaging was performed during the arterial phase of contrast injection from the level of the distal abdominal aorta to just below the bifurcation of the popliteal arteries. Atherosclerotic calcifications are noted involving the distal abdominal aorta and proximal common iliac arteries. There is no sign of significant stenosis involving the aorta, bilateral iliac arteries, bilateral common femoral arteries, bilateral popliteal arteries, or the bilateral proximal branches of the popliteal arteries.The deep femoral arteries are patent bilaterally. Within the subcutaneous fat of the lateral right thigh there is a heterogeneous mass compatible with hematoma (partially excluded from the field of view laterally). The visualized portions measures 7.1 x 6.2 by 14.2 cm, image 156/5 and image 102/6. Along the medial aspect of the hematoma there is a focal pseudoaneurysm, image 139/5, which most likely reflects active arterial phase extravasation from a lateral branch off the right deep femoral artery. Incidental Findings: Status post bilateral total knee arthroplasty. Fat containing umbilical hernia noted. Sigmoid diverticulosis without signs acute diverticulitis. Advanced lumbar degenerative disc disease. Review of the MIP images confirms the above findings. IMPRESSION: Large hematoma within the subcutaneous fat of the lateral right thigh. Along the medial aspect of the hematoma there is a pseudoaneurysm, which likely arises off a lateral branch of the right deep femoral artery. Aortic Atherosclerosis (ICD10-I70.0). Critical Value/emergent results were called by telephone at the time of interpretation on 10/23/2021 at 10:09 am to provider Dr. JCharna Archer who verbally acknowledged these results. Electronically Signed   By: TKerby MoorsM.D.   On: 10/23/2021 10:10   CT Chest W Contrast  Result Date: 10/23/2021 CLINICAL DATA:  Pneumonia, lower extremity vascular trauma. EXAM: CT CHEST WITH  CONTRAST TECHNIQUE: Multidetector CT imaging of the chest was performed during intravenous contrast administration. RADIATION DOSE REDUCTION: This exam was performed according to the departmental dose-optimization program which includes automated exposure control, adjustment of the mA and/or kV according to patient size and/or use of iterative reconstruction technique. CONTRAST:  1210mOMNIPAQUE IOHEXOL 350 MG/ML SOLN COMPARISON:  None Available. FINDINGS: Cardiovascular: No significant vascular findings. Normal heart size. No pericardial effusion. Coronary artery atherosclerosis. Thoracic aortic atherosclerosis. Mediastinum/Nodes: No enlarged mediastinal, hilar, or axillary lymph nodes. Thyroid gland, trachea, and esophagus demonstrate no significant findings. Lungs/Pleura: Lungs are clear. No pleural effusion or pneumothorax. Upper Abdomen: No acute abnormality. Musculoskeletal: No acute osseous abnormality. No aggressive osseous lesion. Chronic T8 vertebral body compression fracture. Anterior bridging osteophytes from T6 through L1 as can be seen with diffuse idiopathic skeletal hyperostosis. IMPRESSION: 1. No acute cardiopulmonary findings. 2. Coronary artery atherosclerosis. 3. Chronic T8 vertebral body compression fracture. 4. Aortic Atherosclerosis (ICD10-I70.0). Electronically Signed   By: HeKathreen Devoid.D.   On: 10/23/2021 09:43   CT HEAD WO CONTRAST (5MM)  Result Date: 10/23/2021 CLINICAL DATA:  Minor head trauma.  Fall from bed EXAM: CT HEAD WITHOUT CONTRAST CT CERVICAL SPINE WITHOUT CONTRAST TECHNIQUE: Multidetector CT imaging of the head and cervical spine was performed following the standard protocol without intravenous contrast. Multiplanar CT image reconstructions of the cervical spine were also generated. RADIATION DOSE REDUCTION: This exam was performed according to the departmental dose-optimization program which includes automated exposure control, adjustment of the mA and/or kV according to  patient size and/or use of iterative reconstruction technique. COMPARISON:  09/13/2006 head CT FINDINGS: CT HEAD FINDINGS Brain: No evidence of swelling, infarction, hemorrhage, hydrocephalus, extra-axial collection or mass lesion/mass effect. Vascular: No hyperdense vessel or unexpected calcification. Skull: Negative for fracture Sinuses/Orbits: No evidence of injury CT CERVICAL SPINE FINDINGS Alignment: No traumatic malalignment Skull base and vertebrae: No acute fracture.  Generalized osteopenia Soft tissues and spinal canal: No prevertebral fluid or swelling. No visible canal hematoma. Disc levels:  Ordinary degenerative changes Upper chest: No acute finding IMPRESSION: No evidence of acute intracranial or cervical spine injury. Electronically Signed   By: Jorje Guild M.D.   On: 10/23/2021 07:00   CT Cervical Spine Wo Contrast  Result Date: 10/23/2021 CLINICAL DATA:  Minor head trauma.  Fall from bed EXAM: CT HEAD WITHOUT CONTRAST CT CERVICAL SPINE WITHOUT CONTRAST TECHNIQUE: Multidetector CT imaging of the head and cervical spine was performed following the standard protocol without intravenous contrast. Multiplanar CT image reconstructions of the cervical spine were also generated. RADIATION DOSE REDUCTION: This exam was performed according to the departmental dose-optimization program which includes automated exposure control, adjustment of the mA and/or kV according to patient size and/or use of iterative reconstruction technique. COMPARISON:  09/13/2006 head CT FINDINGS: CT HEAD FINDINGS Brain: No evidence of swelling, infarction, hemorrhage, hydrocephalus, extra-axial collection or mass lesion/mass effect. Vascular: No hyperdense vessel or unexpected calcification. Skull: Negative for fracture Sinuses/Orbits: No evidence of injury CT CERVICAL SPINE FINDINGS Alignment: No traumatic malalignment Skull base and vertebrae: No acute fracture.  Generalized osteopenia Soft tissues and spinal canal: No  prevertebral fluid or swelling. No visible canal hematoma. Disc levels:  Ordinary degenerative changes Upper chest: No acute finding IMPRESSION: No evidence of acute intracranial or cervical spine injury. Electronically Signed   By: Jorje Guild M.D.   On: 10/23/2021 07:00   DG Chest 2 View  Result Date: 10/23/2021 CLINICAL DATA:  Status post fall.  Complains of right rib cage pain EXAM: CHEST - 2 VIEW COMPARISON:  CT 11/04/2017 FINDINGS: Cardiac enlargement. Aortic atherosclerosis. No pleural effusion or edema. No airspace opacities identified. There is an a airspace opacity on the lateral projection radiograph, likely right middle lobe.Spondylosis identified within the thoracic spine. Unchanged compression fracture within the midthoracic spine. IMPRESSION: 1. Right middle lobe airspace opacity noted on the lateral radiograph concerning for pneumonia. In the absence of any signs or symptoms of infection consider further evaluation with CT of the chest for more definitive characterization and to rule out underlying mass lesion. 2. Cardiac enlargement and aortic atherosclerosis. Electronically Signed   By: Kerby Moors M.D.   On: 10/23/2021 06:47   DG Sternum  Result Date: 10/23/2021 CLINICAL DATA:  Chest pain after assistance off the ground. EXAM: STERNUM - 1 VIEW COMPARISON:  Chest CT 11/04/2017 FINDINGS: There is no evidence of fracture or other focal bone lesions. Subjective osteopenia. IMPRESSION: No evidence of sternal fracture in the lateral projection. Electronically Signed   By: Jorje Guild M.D.   On: 10/23/2021 06:47   DG Femur Min 2 Views Right  Result Date:  10/23/2021 CLINICAL DATA:  Right hip and leg pain after fall from bed EXAM: RIGHT FEMUR 2 VIEWS COMPARISON:  Knee radiograph 12/21/2015 FINDINGS: No evidence of fracture. No hip or knee dislocation. Generalized osteopenia. 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 IMPRESSION: No acute finding. Electronically Signed   By: Jorje Guild M.D.   On: 10/23/2021 06:45   DG Hip Unilat W or Wo Pelvis 2-3 Views Right  Result Date: 10/23/2021 CLINICAL DATA:  Fall with right hip pain EXAM: DG HIP (WITH OR WITHOUT PELVIS) 3V RIGHT COMPARISON:  None Available. FINDINGS: No visible hip fracture. No hip dislocation. Ring of osteophytes at the bilateral hip joint. Trochanteric and iliac osteophytes. Generalized osteopenia. IMPRESSION: No acute finding. Electronically Signed   By: Jorje Guild M.D.   On: 10/23/2021 06:44        Scheduled Meds:  sodium chloride   Intravenous Once   cholecalciferol  500 Units Oral Daily   folic acid  1 mg Oral Daily   gabapentin  100 mg Oral BID   levothyroxine  112 mcg Oral QAC breakfast   [START ON 10/27/2021] methotrexate  20 mg Oral Q Thu   omega-3 acid ethyl esters  1 g Oral Daily   pantoprazole  40 mg Oral Daily   sertraline  150 mg Oral Daily   Continuous Infusions:  sodium chloride 75 mL/hr at 10/24/21 0429     LOS: 0 days     Desma Maxim, MD [facility]   If 7PM-7AM, please contact night-coverage [email or web address] Password [facility] 10/24/2021, 10:38 AM

## 2021-10-25 DIAGNOSIS — S7011XA Contusion of right thigh, initial encounter: Secondary | ICD-10-CM | POA: Diagnosis not present

## 2021-10-25 LAB — BASIC METABOLIC PANEL
Anion gap: 4 — ABNORMAL LOW (ref 5–15)
BUN: 10 mg/dL (ref 8–23)
CO2: 29 mmol/L (ref 22–32)
Calcium: 7.8 mg/dL — ABNORMAL LOW (ref 8.9–10.3)
Chloride: 105 mmol/L (ref 98–111)
Creatinine, Ser: 0.73 mg/dL (ref 0.44–1.00)
GFR, Estimated: >60 mL/min (ref >60)
Glucose, Bld: 105 mg/dL — ABNORMAL HIGH (ref 70–99)
Potassium: 3.4 mmol/L — ABNORMAL LOW (ref 3.5–5.1)
Sodium: 138 mmol/L (ref 135–145)

## 2021-10-25 LAB — GLUCOSE, CAPILLARY
Glucose-Capillary: 107 mg/dL — ABNORMAL HIGH (ref 70–99)
Glucose-Capillary: 127 mg/dL — ABNORMAL HIGH (ref 70–99)
Glucose-Capillary: 130 mg/dL — ABNORMAL HIGH (ref 70–99)

## 2021-10-25 LAB — CBC
HCT: 24.2 % — ABNORMAL LOW (ref 36.0–46.0)
Hemoglobin: 7.4 g/dL — ABNORMAL LOW (ref 12.0–15.0)
MCH: 26.8 pg (ref 26.0–34.0)
MCHC: 30.6 g/dL (ref 30.0–36.0)
MCV: 87.7 fL (ref 80.0–100.0)
Platelets: 217 10*3/uL (ref 150–400)
RBC: 2.76 MIL/uL — ABNORMAL LOW (ref 3.87–5.11)
RDW: 15.6 % — ABNORMAL HIGH (ref 11.5–15.5)
WBC: 10.2 10*3/uL (ref 4.0–10.5)
nRBC: 0 % (ref 0.0–0.2)

## 2021-10-25 LAB — HEMOGLOBIN: Hemoglobin: 8.3 g/dL — ABNORMAL LOW (ref 12.0–15.0)

## 2021-10-25 LAB — MAGNESIUM: Magnesium: 1.8 mg/dL (ref 1.7–2.4)

## 2021-10-25 MED ORDER — TRAMADOL HCL 50 MG PO TABS
50.0000 mg | ORAL_TABLET | Freq: Four times a day (QID) | ORAL | Status: DC | PRN
  Administered 2021-10-26 (×2): 50 mg via ORAL
  Filled 2021-10-25 (×2): qty 1

## 2021-10-25 MED ORDER — POTASSIUM CHLORIDE CRYS ER 20 MEQ PO TBCR
40.0000 meq | EXTENDED_RELEASE_TABLET | Freq: Once | ORAL | Status: AC
  Administered 2021-10-25: 40 meq via ORAL
  Filled 2021-10-25: qty 2

## 2021-10-25 MED ORDER — MAGNESIUM SULFATE IN D5W 1-5 GM/100ML-% IV SOLN
1.0000 g | Freq: Once | INTRAVENOUS | Status: AC
  Administered 2021-10-25: 1 g via INTRAVENOUS
  Filled 2021-10-25: qty 100

## 2021-10-25 MED ORDER — PROPRANOLOL HCL 20 MG PO TABS
40.0000 mg | ORAL_TABLET | Freq: Two times a day (BID) | ORAL | Status: DC
  Administered 2021-10-25 – 2021-10-29 (×5): 40 mg via ORAL
  Filled 2021-10-25 (×7): qty 2

## 2021-10-25 NOTE — Progress Notes (Signed)
PROGRESS NOTE    Glenda Williams  UMP:[id number] DOB: 02-09-1941 DOA: 10/23/2021 PCP: Virginia Crews, MD  Outpatient Specialists: rheumatology, cardiology    Brief Narrative:   From admission h and p Glenda Williams is a 81 y.o. female with medical history significant of A-fib on Xarelto, hypertension, hyperlipidemia, prediabetes, rheumatoid arthritis, Bell's palsy, essential tremor, obesity with BMI 38.45, dCHF, who presents with fall and pain in right upper posterior thigh.   Pt states that she fell accidentally when she was out of bed, going to bathroom at about 3:30 AM.  No loss of consciousness.  She injured her left upper posterior thigh, causing pain which was initially severe, currently mild, sharp, nonradiating.  She has bruise over right upper posterior thigh.  No head or neck injury.  Patient denies chest pain, cough, shortness of breath.  No fever or chills.  Denies nausea, vomiting, diarrhea or abdominal pain.  No symptoms of UTI.  No dark stool or rectal bleeding.  Her last dose of Xarelto was last night.   Patient blood pressure was 132/98 initially, which dropped to 88/47 in ED.  Mental status normal.  Patient was given 1 dose of Kcentra in the ED.   Assessment & Plan:   Principal Problem:   Hematoma of right thigh Active Problems:   Acute blood loss anemia   Fall at home, initial encounter   Hypokalemia   Rheumatoid arthritis ([place])   Depression with anxiety   Adult hypothyroidism   Obesity with body mass index (BMI) of 30.0 to 39.9   Compression fracture of body of thoracic vertebra_T8, chronic   Benign essential tremor   HTN (hypertension)   Atrial fibrillation (HCC)   Morbid obesity (HCC)   Hypertension   Chronic diastolic CHF (congestive heart failure) ([place])   Hematoma  # Hematoma right thigh  # Acute blood loss anemia CTA with large hematoma subcu right lateral thigh with pseudoaneurysm. Hgb 11.3>8s. received kcentra and TXA. Does not appear to have  compartment syndrome. Hgb stable in 8s. S/p venofer on 8/21 - trend hgb - vascular following, attempting conservative mgmt - holding xarelto - consider repeat dose of venofer prior to d/c - bed rest for now per vascular, if hgb stable tomorrow can likely mobilize - will need PT eval prior to discharge, husband very concerned about her ability to thrive at home  # Fall Imaging of head, neck, leg, and pelvis all negative for fracture  # RA Followed by rheum - cont home methotrexate, folic acid  # Chronic pain - home gabapentin  # MDD - home sertraline  # A-fib No rate/rhythm meds at home. Here rate controlled - hold home xarelto  # Hypothyroid - home levothyroxoine  # HFpEF Appears compensated   # Obesity Noted   # Tremor - resume home propranolol  # HTN Here bp soft - resuming home propranolol; holding hctz for now      DVT prophylaxis: SCDs Code Status: full Family Communication:  husband updated @ bedside 8/22  Level of care: Med-Surg Status is: Observation, will discuss with UR    Consultants:  vascular  Procedures: none  Antimicrobials:  none    Subjective: Mild thigh pain much improved  Objective: Vitals:   10/24/21 2350 10/25/21 0353 10/25/21 0854 10/25/21 1256  BP: 99/68 132/81 (!) 113/29 (!) 121/39  Pulse: 68 (!) 59 63 (!) 57  Resp: '20 18 18 20  '$ Temp: 97.6 F (36.4 C) 98 F (36.7 C) 98 F (36.7  C) (!) 97.3 F (36.3 C)  TempSrc:   Oral Oral  SpO2: 96% 94% 98% 100%  Weight:  114.9 kg    Height:        Intake/Output Summary (Last 24 hours) at 10/25/2021 1553 Last data filed at 10/25/2021 1517 Gross per 24 hour  Intake 1680 ml  Output 3500 ml  Net -1820 ml   Filed Weights   10/23/21 0526 10/24/21 0431 10/25/21 0353  Weight: 104.8 kg 113.9 kg 114.9 kg    Examination:  General exam: Appears calm and comfortable  Respiratory system: Clear to auscultation. Respiratory effort normal. Cardiovascular system: S1 & S2 heard,  RRR. No JVD, murmurs, rubs, gallops or clicks. No pedal edema. Gastrointestinal system: Abdomen is nondistended, soft and nontender. No organomegaly or masses felt. Normal bowel sounds heard. Central nervous system: Alert and oriented. No focal neurological deficits. Distal sensation intact Extremities: Symmetric 5 x 5 power. Skin: hematoma right posterior thigh Psychiatry: Judgement and insight appear normal. Mood & affect appropriate.     Data Reviewed: I have personally reviewed following labs and imaging studies  CBC: Recent Labs  Lab 10/23/21 0703 10/23/21 1309 10/23/21 1604 10/24/21 0438 10/24/21 1340 10/25/21 0445 10/25/21 1437  WBC 10.4 10.8* 10.0 9.1  --  10.2  --   NEUTROABS 7.6  --   --   --   --   --   --   HGB 10.7* 8.7* 8.5* 8.4* 8.3* 7.4* 8.3*  HCT 34.3* 28.3* 27.9* 27.2*  --  24.2*  --   MCV 86.2 86.8 87.5 87.7  --  87.7  --   PLT 311 275 267 260  --  217  --    Basic Metabolic Panel: Recent Labs  Lab 10/23/21 0703 10/24/21 0438 10/25/21 0445  NA 138 138 138  K 2.8* 3.9 3.4*  CL 100 103 105  CO2 '29 30 29  '$ GLUCOSE 111* 100* 105*  BUN '19 15 10  '$ CREATININE 0.82 0.77 0.73  CALCIUM 8.9 8.0* 7.8*  MG 1.7  --  1.8   GFR: Estimated Creatinine Clearance: 72.2 mL/min (by C-G formula based on SCr of 0.73 mg/dL). Liver Function Tests: No results for input(s): "AST", "ALT", "ALKPHOS", "BILITOT", "PROT", "ALBUMIN" in the last 168 hours. No results for input(s): "LIPASE", "AMYLASE" in the last 168 hours. No results for input(s): "AMMONIA" in the last 168 hours. Coagulation Profile: Recent Labs  Lab 10/23/21 0703  INR 2.4*   Cardiac Enzymes: No results for input(s): "CKTOTAL", "CKMB", "CKMBINDEX", "TROPONINI" in the last 168 hours. BNP (last 3 results) No results for input(s): "PROBNP" in the last 8760 hours. HbA1C: No results for input(s): "HGBA1C" in the last 72 hours. CBG: Recent Labs  Lab 10/24/21 1202 10/24/21 1618 10/25/21 0727 10/25/21 1113  10/25/21 1530  GLUCAP 154* 94 107* 127* 130*   Lipid Profile: No results for input(s): "CHOL", "HDL", "LDLCALC", "TRIG", "CHOLHDL", "LDLDIRECT" in the last 72 hours. Thyroid Function Tests: No results for input(s): "TSH", "T4TOTAL", "FREET4", "T3FREE", "THYROIDAB" in the last 72 hours. Anemia Panel: No results for input(s): "VITAMINB12", "FOLATE", "FERRITIN", "TIBC", "IRON", "RETICCTPCT" in the last 72 hours. Urine analysis:    Component Value Date/Time   COLORURINE YELLOW (A) 10/23/2021 1036   APPEARANCEUR CLEAR (A) 10/23/2021 1036   APPEARANCEUR Cloudy (A) 01/20/2021 0902   LABSPEC 1.026 10/23/2021 1036   PHURINE 5.0 10/23/2021 1036   GLUCOSEU NEGATIVE 10/23/2021 1036   HGBUR SMALL (A) 10/23/2021 1036   BILIRUBINUR NEGATIVE 10/23/2021 1036   BILIRUBINUR  Negative 01/20/2021 0902   KETONESUR NEGATIVE 10/23/2021 1036   PROTEINUR NEGATIVE 10/23/2021 1036   UROBILINOGEN >=8.0 (A) 11/11/2020 1334   NITRITE NEGATIVE 10/23/2021 1036   LEUKOCYTESUR TRACE (A) 10/23/2021 1036   Sepsis Labs: '@LABRCNTIP'$ (procalcitonin:4,lacticidven:4)  ) Recent Results (from the past 240 hour(s))  SARS Coronavirus 2 by RT PCR (hospital order, performed in [facility] hospital lab) *cepheid single result test* Anterior Nasal Swab     Status: None   Collection Time: 10/23/21  7:03 AM   Specimen: Anterior Nasal Swab  Result Value Ref Range Status   SARS Coronavirus 2 by RT PCR NEGATIVE NEGATIVE Final    Comment: (NOTE) SARS-CoV-2 target nucleic acids are NOT DETECTED.  The SARS-CoV-2 RNA is generally detectable in upper and lower respiratory specimens during the acute phase of infection. The lowest concentration of SARS-CoV-2 viral copies this assay can detect is 250 copies / mL. A negative result does not preclude SARS-CoV-2 infection and should not be used as the sole basis for treatment or other patient management decisions.  A negative result may occur with improper specimen collection / handling,  submission of specimen other than nasopharyngeal swab, presence of viral mutation(s) within the areas targeted by this assay, and inadequate number of viral copies (<250 copies / mL). A negative result must be combined with clinical observations, patient history, and epidemiological information.  Fact Sheet for Patients:   https://www.patel.info/  Fact Sheet for Healthcare Providers: https://hall.com/  This test is not yet approved or  cleared by the [place] FDA and has been authorized for detection and/or diagnosis of SARS-CoV-2 by FDA under an Emergency Use Authorization (EUA).  This EUA will remain in effect (meaning this test can be used) for the duration of the COVID-19 declaration under Section 564(b)(1) of the Act, 21 U.S.C. section 360bbb-3(b)(1), unless the authorization is terminated or revoked sooner.  Performed at [facility], [address] [place], [place] [address]          Radiology Studies: No results found.      Scheduled Meds:  sodium chloride   Intravenous Once   cholecalciferol  500 Units Oral Daily   folic acid  1 mg Oral Daily   gabapentin  100 mg Oral BID   levothyroxine  112 mcg Oral QAC breakfast   [START ON 10/27/2021] methotrexate  20 mg Oral Q Thu   pantoprazole  40 mg Oral Daily   potassium chloride  40 mEq Oral Once   sertraline  150 mg Oral Daily   Continuous Infusions:  sodium chloride 75 mL/hr at 10/25/21 4944   magnesium sulfate bolus IVPB       LOS: 1 day     Desma Maxim, MD [facility]   If 7PM-7AM, please contact night-coverage [email or web address] Password [facility] 10/25/2021, 3:53 PM

## 2021-10-25 NOTE — Progress Notes (Signed)
PT Cancellation Note  Patient Details Name: Glenda Williams MRN: [id number] DOB: [birth year]   Cancelled Treatment:    Reason Eval/Treat Not Completed: Medical issues which prohibited therapy. Active bedrest order;Patient not medically ready. Chart reviewed. Pt still with active bed rest order. Clarified with care team via secure chat. Advised to continue holding. Will attempt once medically stable.    Lieutenant Diego PT, DPT 8:19 AM,10/25/21

## 2021-10-25 NOTE — Progress Notes (Signed)
OT Cancellation Note  Patient Details Name: OPEL LEJEUNE MRN: [id number] DOB: 05-22-1940   Cancelled Treatment:    Reason Eval/Treat Not Completed: Active bedrest order;Patient not medically ready. Chart reviewed. Pt still with active bed rest order. Clarified with care team via secure chat. Advised to continue holding. Will attempt once medically stable.   Ardeth Perfect., MPH, MS, OTR/L ascom [phone] 10/25/21, 8:14 AM

## 2021-10-25 NOTE — Progress Notes (Signed)
[facility]  Daily Progress Note   Subjective  -   Patient says she is feeling better.  Not having a lot of pain.  Thighs not as sore and it does not appear to have any progression clinically  Objective Vitals:   10/24/21 2005 10/24/21 2350 10/25/21 0353 10/25/21 0854  BP: (!) 125/43 99/68 132/81 (!) 113/29  Pulse: 60 68 (!) 59 63  Resp: '18 20 18 18  '$ Temp: 99 F (37.2 C) 97.6 F (36.4 C) 98 F (36.7 C) 98 F (36.7 C)  TempSrc:    Oral  SpO2: 97% 96% 94% 98%  Weight:   114.9 kg   Height:        Intake/Output Summary (Last 24 hours) at 10/25/2021 1202 Last data filed at 10/25/2021 1055 Gross per 24 hour  Intake 1440 ml  Output 3150 ml  Net -1710 ml    PULM  CTAB CV  slightly bradycardic VASC  right thigh bruising without significant expanding hematoma  Laboratory CBC    Component Value Date/Time   WBC 10.2 10/25/2021 0445   HGB 7.4 (L) 10/25/2021 0445   HGB 11.3 08/11/2021 1058   HCT 24.2 (L) 10/25/2021 0445   HCT 35.7 08/11/2021 1058   PLT 217 10/25/2021 0445   PLT 259 08/11/2021 1058    BMET    Component Value Date/Time   NA 138 10/25/2021 0445   NA 140 08/11/2021 1058   NA 142 02/13/2014 0356   K 3.4 (L) 10/25/2021 0445   K 3.8 02/13/2014 0356   CL 105 10/25/2021 0445   CL 107 02/13/2014 0356   CO2 29 10/25/2021 0445   CO2 29 02/13/2014 0356   GLUCOSE 105 (H) 10/25/2021 0445   GLUCOSE 92 02/13/2014 0356   BUN 10 10/25/2021 0445   BUN 15 08/11/2021 1058   BUN 14 02/13/2014 0356   CREATININE 0.73 10/25/2021 0445   CREATININE 0.77 02/13/2014 0356   CALCIUM 7.8 (L) 10/25/2021 0445   CALCIUM 8.6 02/13/2014 0356   GFRNONAA >60 10/25/2021 0445   GFRNONAA >60 02/13/2014 0356   GFRAA 77 11/18/2019 1104   GFRAA >60 02/13/2014 0356    Assessment/Planning:   Right thigh hematoma from fall on anticoagulation Off of anticoagulation now. Thigh appearance is stable. Had some drift in her hemoglobin overnight so we will continue  to monitor this and keep her on bedrest. Discussed with family that embolization would be an option if she has continued bleeding, but at this point I think it is very slow and this is likely more equilibration and time.  They are agreeable with continuing to monitor. Would saline lock her fluid and she can eat for now.   Leotis Pain  10/25/2021, 12:02 PM

## 2021-10-26 DIAGNOSIS — I480 Paroxysmal atrial fibrillation: Secondary | ICD-10-CM

## 2021-10-26 DIAGNOSIS — S7011XA Contusion of right thigh, initial encounter: Secondary | ICD-10-CM | POA: Diagnosis not present

## 2021-10-26 DIAGNOSIS — D62 Acute posthemorrhagic anemia: Secondary | ICD-10-CM | POA: Diagnosis not present

## 2021-10-26 LAB — GLUCOSE, CAPILLARY
Glucose-Capillary: 97 mg/dL (ref 70–99)
Glucose-Capillary: 114 mg/dL — ABNORMAL HIGH (ref 70–99)
Glucose-Capillary: 90 mg/dL (ref 70–99)

## 2021-10-26 LAB — CBC
HCT: 25.7 % — ABNORMAL LOW (ref 36.0–46.0)
Hemoglobin: 8 g/dL — ABNORMAL LOW (ref 12.0–15.0)
MCH: 27.4 pg (ref 26.0–34.0)
MCHC: 31.1 g/dL (ref 30.0–36.0)
MCV: 88 fL (ref 80.0–100.0)
Platelets: 253 10*3/uL (ref 150–400)
RBC: 2.92 MIL/uL — ABNORMAL LOW (ref 3.87–5.11)
RDW: 15.9 % — ABNORMAL HIGH (ref 11.5–15.5)
WBC: 11.9 10*3/uL — ABNORMAL HIGH (ref 4.0–10.5)
nRBC: 0 % (ref 0.0–0.2)

## 2021-10-26 NOTE — Progress Notes (Signed)
PROGRESS NOTE    Glenda Williams  MVH:[id number] DOB: May 21, 1940 DOA: 10/23/2021 PCP: Virginia Crews, MD    Assessment & Plan:   Principal Problem:   Hematoma of right thigh Active Problems:   Acute blood loss anemia   Fall at home, initial encounter   Hypokalemia   Rheumatoid arthritis ([place])   Depression with anxiety   Adult hypothyroidism   Obesity with body mass index (BMI) of 30.0 to 39.9   Compression fracture of body of thoracic vertebra_T8, chronic   Benign essential tremor   HTN (hypertension)   Atrial fibrillation (HCC)   Morbid obesity (HCC)   Hypertension   Chronic diastolic CHF (congestive heart failure) (HCC)   Hematoma  Assessment and Plan:  Hematoma right thigh: CTA with large hematoma subcu right lateral thigh with pseudoaneurysm. S/p kcentra and TXA. Does not appear to have compartment syndrome. S/p venofer on 8/21. Vasc surg following and recs apprec. Continue to hold xarelto   Acute blood loss anemia: secondary to above. H&H are labile.    Fall: PT/OT consulted   Rheumatoid arthritis: continue on home dose of methotrexate, folic acid    Chronic pain: continue on home dose of gabapentin    Major depression: continue on home dose of sertraline    Likely PAF: continue on propranolol. Holding xarelto    Hypothyroidism: continue on levothyroxine   Chronic diastolic CHF: appears compensated. Monitor I/Os   Morbid obesity: BMI 42.1. Complicates overall care & prognosis   Tremor: continue on propranolol    HTN: continue on propranolol. Holding HCTZ     DVT prophylaxis: SCDs Code Status:   full  Family Communication: discussed pt's care w/ pt's family at bedside and answered their questions  Disposition Plan: depends on PT/OT recs   Level of care: Med-Surg  Status is: Inpatient Remains inpatient appropriate because: severity of illness    Consultants:  Vasc surg   Procedures:   Antimicrobials:    Subjective: Pt c/o fatigue    Objective: Vitals:   10/25/21 2117 10/26/21 0029 10/26/21 0440 10/26/21 0733  BP: (!) 115/57 (!) 116/42 (!) 125/40 (!) 129/50  Pulse: 61 61 68 (!) 59  Resp: '18 19 19 16  '$ Temp: 98 F (36.7 C) 98.2 F (36.8 C) 98.4 F (36.9 C) 98 F (36.7 C)  TempSrc: Oral Oral Oral   SpO2: 98% 97% 97% 99%  Weight:      Height:        Intake/Output Summary (Last 24 hours) at 10/26/2021 0841 Last data filed at 10/26/2021 0440 Gross per 24 hour  Intake 480 ml  Output 4000 ml  Net -3520 ml   Filed Weights   10/23/21 0526 10/24/21 0431 10/25/21 0353  Weight: 104.8 kg 113.9 kg 114.9 kg    Examination:  General exam: Appears calm and comfortable  Respiratory system: Clear to auscultation. Respiratory effort normal. Cardiovascular system: S1 & S2+. No rubs, gallops or clicks. Gastrointestinal system: Abdomen is obese, soft and nontender. Normal bowel sounds heard. Central nervous system: Alert and oriented. Moves all extremities  Psychiatry: Judgement and insight appear normal. Flat mood and affect    Data Reviewed: I have personally reviewed following labs and imaging studies  CBC: Recent Labs  Lab 10/23/21 0703 10/23/21 1309 10/23/21 1604 10/24/21 0438 10/24/21 1340 10/25/21 0445 10/25/21 1437 10/26/21 0512  WBC 10.4 10.8* 10.0 9.1  --  10.2  --  11.9*  NEUTROABS 7.6  --   --   --   --   --   --   --  HGB 10.7* 8.7* 8.5* 8.4* 8.3* 7.4* 8.3* 8.0*  HCT 34.3* 28.3* 27.9* 27.2*  --  24.2*  --  25.7*  MCV 86.2 86.8 87.5 87.7  --  87.7  --  88.0  PLT 311 275 267 260  --  217  --  161   Basic Metabolic Panel: Recent Labs  Lab 10/23/21 0703 10/24/21 0438 10/25/21 0445  NA 138 138 138  K 2.8* 3.9 3.4*  CL 100 103 105  CO2 '29 30 29  '$ GLUCOSE 111* 100* 105*  BUN '19 15 10  '$ CREATININE 0.82 0.77 0.73  CALCIUM 8.9 8.0* 7.8*  MG 1.7  --  1.8   GFR: Estimated Creatinine Clearance: 72.2 mL/min (by C-G formula based on SCr of 0.73 mg/dL). Liver Function Tests: No results for  input(s): "AST", "ALT", "ALKPHOS", "BILITOT", "PROT", "ALBUMIN" in the last 168 hours. No results for input(s): "LIPASE", "AMYLASE" in the last 168 hours. No results for input(s): "AMMONIA" in the last 168 hours. Coagulation Profile: Recent Labs  Lab 10/23/21 0703  INR 2.4*   Cardiac Enzymes: No results for input(s): "CKTOTAL", "CKMB", "CKMBINDEX", "TROPONINI" in the last 168 hours. BNP (last 3 results) No results for input(s): "PROBNP" in the last 8760 hours. HbA1C: No results for input(s): "HGBA1C" in the last 72 hours. CBG: Recent Labs  Lab 10/24/21 1618 10/25/21 0727 10/25/21 1113 10/25/21 1530 10/26/21 0803  GLUCAP 94 107* 127* 130* 97   Lipid Profile: No results for input(s): "CHOL", "HDL", "LDLCALC", "TRIG", "CHOLHDL", "LDLDIRECT" in the last 72 hours. Thyroid Function Tests: No results for input(s): "TSH", "T4TOTAL", "FREET4", "T3FREE", "THYROIDAB" in the last 72 hours. Anemia Panel: No results for input(s): "VITAMINB12", "FOLATE", "FERRITIN", "TIBC", "IRON", "RETICCTPCT" in the last 72 hours. Sepsis Labs: Recent Labs  Lab 10/23/21 0703  PROCALCITON <0.10    Recent Results (from the past 240 hour(s))  SARS Coronavirus 2 by RT PCR (hospital order, performed in [facility] hospital lab) *cepheid single result test* Anterior Nasal Swab     Status: None   Collection Time: 10/23/21  7:03 AM   Specimen: Anterior Nasal Swab  Result Value Ref Range Status   SARS Coronavirus 2 by RT PCR NEGATIVE NEGATIVE Final    Comment: (NOTE) SARS-CoV-2 target nucleic acids are NOT DETECTED.  The SARS-CoV-2 RNA is generally detectable in upper and lower respiratory specimens during the acute phase of infection. The lowest concentration of SARS-CoV-2 viral copies this assay can detect is 250 copies / mL. A negative result does not preclude SARS-CoV-2 infection and should not be used as the sole basis for treatment or other patient management decisions.  A negative result may occur  with improper specimen collection / handling, submission of specimen other than nasopharyngeal swab, presence of viral mutation(s) within the areas targeted by this assay, and inadequate number of viral copies (<250 copies / mL). A negative result must be combined with clinical observations, patient history, and epidemiological information.  Fact Sheet for Patients:   https://www.patel.info/  Fact Sheet for Healthcare Providers: https://hall.com/  This test is not yet approved or  cleared by the [place] FDA and has been authorized for detection and/or diagnosis of SARS-CoV-2 by FDA under an Emergency Use Authorization (EUA).  This EUA will remain in effect (meaning this test can be used) for the duration of the COVID-19 declaration under Section 564(b)(1) of the Act, 21 U.S.C. section 360bbb-3(b)(1), unless the authorization is terminated or revoked sooner.  Performed at [facility], [place],  [place], [place] [address]          Radiology Studies: No results found.      Scheduled Meds:  sodium chloride   Intravenous Once   cholecalciferol  500 Units Oral Daily   folic acid  1 mg Oral Daily   gabapentin  100 mg Oral BID   levothyroxine  112 mcg Oral QAC breakfast   [START ON 10/27/2021] methotrexate  20 mg Oral Q Thu   pantoprazole  40 mg Oral Daily   propranolol  40 mg Oral BID   sertraline  150 mg Oral Daily   Continuous Infusions:   LOS: 2 days    Time spent: 33 mins     Wyvonnia Dusky, MD [facility] Pager [phone]  If 7PM-7AM, please contact night-coverage [email or web address] 10/26/2021, 8:41 AM

## 2021-10-26 NOTE — Evaluation (Signed)
Physical Therapy Evaluation Patient Details Name: Glenda Williams MRN: [id number] DOB: 06-Nov-1940 Today's Date: 10/26/2021  History of Present Illness  Pt admitted for hematoma of R thigh with complaints of fall. History includes Afib, HTN, HLD, RA, and Bell's palsy. Of note, pt has been on active bedrest, cleared to perform graded activity as tolerated perf vascular MD.  Clinical Impression  Pt is a pleasant 81 year old female who was admitted for hematoma on R thigh. Per secure chat, cleared for mobility this date. Pt and husband eager to perform OOB mobility. Pt appears very motivated. Shoes donned prior to mobility tasks. Pt performs bed mobility with cga, transfers with min assist, and ambulation with cga and RW. Pt demonstrates deficits with strength/endurance/mobility. Majority of session spent on toileting, ran out of time/endurance for out of room mobility. Cleared and encouraged pt to continue mobility efforts with nursing staff to decrease risk of deconditioning. Would benefit from skilled PT to address above deficits and promote optimal return to PLOF. Recommend transition to [place] upon discharge from acute hospitalization.      Recommendations for follow up therapy are one component of a multi-disciplinary discharge planning process, led by the attending physician.  Recommendations may be updated based on patient status, additional functional criteria and insurance authorization.  Follow Up Recommendations Home health PT      Assistance Recommended at Discharge Intermittent Supervision/Assistance  Patient can return home with the following  A little help with walking and/or transfers;A lot of help with bathing/dressing/bathroom;Assistance with cooking/housework;Assist for transportation;Help with stairs or ramp for entrance    Equipment Recommendations None recommended by PT  Recommendations for Other Services       Functional Status Assessment Patient has had a recent decline in  their functional status and demonstrates the ability to make significant improvements in function in a reasonable and predictable amount of time.     Precautions / Restrictions Precautions Precautions: Fall Restrictions Weight Bearing Restrictions: No      Mobility  Bed Mobility Overal bed mobility: Needs Assistance Bed Mobility: Supine to Sit     Supine to sit: Min guard     General bed mobility comments: assist for trunkal elevation. Once seated at EOB, upright posture noted    Transfers Overall transfer level: Needs assistance Equipment used: Rolling walker (2 wheels) Transfers: Sit to/from Stand Sit to Stand: Min assist           General transfer comment: needs slightly elevated bed along with min assist. Decreased power noted during transfer. RW used    Ambulation/Gait Ambulation/Gait assistance: Counsellor (Feet): 30 Feet Assistive device: Rolling walker (2 wheels) Gait Pattern/deviations: Step-to pattern       General Gait Details: ambulated around bed, to [facility], and then to recliner. RW used with wide BOS. Slow gait speed  Stairs            Wheelchair Mobility    Modified Rankin (Stroke Patients Only)       Balance Overall balance assessment: Needs assistance Sitting-balance support: Feet supported Sitting balance-Leahy Scale: Good     Standing balance support: Bilateral upper extremity supported Standing balance-Leahy Scale: Fair                               Pertinent Vitals/Pain Pain Assessment Pain Assessment: No/denies pain    Home Living Family/patient expects to be discharged to:: Private residence Living Arrangements: Spouse/significant other Available Help  at Discharge: Family Type of Home: House Home Access: Stairs to enter   [employer] of Steps: 1 small threshold step to enter   Home Layout: One level Home Equipment: Conservation officer, nature (2 wheels);Cane - single point;BSC/3in1;Rollator  (4 wheels);Standard Walker Additional Comments: has lot of DME in home, doesn't use at baseline    Prior Function Prior Level of Function : Independent/Modified Independent             Mobility Comments: reports she was indep with all mobility PTA       Hand Dominance        Extremity/Trunk Assessment   Upper Extremity Assessment Upper Extremity Assessment: Generalized weakness (B UE grossly 4/5)    Lower Extremity Assessment Lower Extremity Assessment: Generalized weakness (B LE grossly 3+/5)       Communication   Communication: No difficulties  Cognition Arousal/Alertness: Awake/alert Behavior During Therapy: WFL for tasks assessed/performed Overall Cognitive Status: Within Functional Limits for tasks assessed                                 General Comments: pleasant and agreeable to session        General Comments      Exercises Other Exercises Other Exercises: ambulated over to [facility]. Needs min assist for transfers and max assist for hygiene.   Assessment/Plan    PT Assessment Patient needs continued PT services  PT Problem List Decreased strength;Decreased activity tolerance;Decreased balance;Decreased mobility       PT Treatment Interventions Gait training;DME instruction;Therapeutic activities;Therapeutic exercise;Balance training    PT Goals (Current goals can be found in the Care Plan section)  Acute Rehab PT Goals Patient Stated Goal: to try to walk PT Goal Formulation: With patient Time For Goal Achievement: 11/09/21 Potential to Achieve Goals: Good    Frequency Min 2X/week     Co-evaluation               AM-PAC PT "6 Clicks" Mobility  Outcome Measure Help needed turning from your back to your side while in a flat bed without using bedrails?: A Little Help needed moving from lying on your back to sitting on the side of a flat bed without using bedrails?: A Little Help needed moving to and from a bed to a chair  (including a wheelchair)?: A Little Help needed standing up from a chair using your arms (e.g., wheelchair or bedside chair)?: A Little Help needed to walk in hospital room?: A Little Help needed climbing 3-5 steps with a railing? : A Lot 6 Click Score: 17    End of Session Equipment Utilized During Treatment: Gait belt Activity Tolerance: Patient tolerated treatment well Patient left: in chair;with chair alarm set;with family/visitor present Nurse Communication: Mobility status PT Visit Diagnosis: Muscle weakness (generalized) (M62.81);Difficulty in walking, not elsewhere classified (R26.2)    Time: 1440-1531 PT Time Calculation (min) (ACUTE ONLY): 51 min   Charges:   PT Evaluation $PT Eval Low Complexity: 1 Low PT Treatments $Gait Training: 8-22 mins $Therapeutic Activity: 8-22 mins        Greggory Stallion, PT, DPT, GCS [phone]   Glenda Williams 10/26/2021, 4:28 PM

## 2021-10-27 ENCOUNTER — Other Ambulatory Visit: Payer: Self-pay | Admitting: Family Medicine

## 2021-10-27 DIAGNOSIS — S7011XA Contusion of right thigh, initial encounter: Secondary | ICD-10-CM | POA: Diagnosis not present

## 2021-10-27 DIAGNOSIS — I480 Paroxysmal atrial fibrillation: Secondary | ICD-10-CM | POA: Diagnosis not present

## 2021-10-27 DIAGNOSIS — D62 Acute posthemorrhagic anemia: Secondary | ICD-10-CM | POA: Diagnosis not present

## 2021-10-27 LAB — GLUCOSE, CAPILLARY
Glucose-Capillary: 86 mg/dL (ref 70–99)
Glucose-Capillary: 126 mg/dL — ABNORMAL HIGH (ref 70–99)

## 2021-10-27 LAB — BASIC METABOLIC PANEL
Anion gap: 4 — ABNORMAL LOW (ref 5–15)
BUN: 9 mg/dL (ref 8–23)
CO2: 28 mmol/L (ref 22–32)
Calcium: 8.4 mg/dL — ABNORMAL LOW (ref 8.9–10.3)
Chloride: 106 mmol/L (ref 98–111)
Creatinine, Ser: 0.56 mg/dL (ref 0.44–1.00)
GFR, Estimated: >60 mL/min (ref >60)
Glucose, Bld: 90 mg/dL (ref 70–99)
Potassium: 3.9 mmol/L (ref 3.5–5.1)
Sodium: 138 mmol/L (ref 135–145)

## 2021-10-27 LAB — CBC
HCT: 24.8 % — ABNORMAL LOW (ref 36.0–46.0)
Hemoglobin: 7.8 g/dL — ABNORMAL LOW (ref 12.0–15.0)
MCH: 27.9 pg (ref 26.0–34.0)
MCHC: 31.5 g/dL (ref 30.0–36.0)
MCV: 88.6 fL (ref 80.0–100.0)
Platelets: 294 10*3/uL (ref 150–400)
RBC: 2.8 MIL/uL — ABNORMAL LOW (ref 3.87–5.11)
RDW: 16.2 % — ABNORMAL HIGH (ref 11.5–15.5)
WBC: 11.6 10*3/uL — ABNORMAL HIGH (ref 4.0–10.5)
nRBC: 0 % (ref 0.0–0.2)

## 2021-10-27 NOTE — Progress Notes (Signed)
PROGRESS NOTE    Glenda Williams  WNI:[id number] DOB: 07-May-1940 DOA: 10/23/2021 PCP: Virginia Crews, MD    Assessment & Plan:   Principal Problem:   Hematoma of right thigh Active Problems:   Acute blood loss anemia   Fall at home, initial encounter   Hypokalemia   Rheumatoid arthritis ([place])   Depression with anxiety   Adult hypothyroidism   Obesity with body mass index (BMI) of 30.0 to 39.9   Compression fracture of body of thoracic vertebra_T8, chronic   Benign essential tremor   HTN (hypertension)   Atrial fibrillation (HCC)   Morbid obesity (HCC)   Hypertension   Chronic diastolic CHF (congestive heart failure) (HCC)   Hematoma  Assessment and Plan:  Hematoma right thigh: CTA with large hematoma subcu right lateral thigh with pseudoaneurysm. S/p kcentra and TXA. Does not appear to have compartment syndrome. S/p venofer on 8/21. Continue to hold xarelto. Vasc surg following and recs apprec.   Acute blood loss anemia: secondary to above. H&H are labile. Will transfuse if Hb < 7.0    Fall:  PT/OT recs HH    Rheumatoid arthritis: continue on folic acid, methotrexate    Chronic pain: continue on home dose of gabapentin    Major depression: severity unknown. Continue on home dose of sertraline    Likely PAF: continue on BB. Holding xarelto    Hypothyroidism: continue on synthroid   Chronic diastolic CHF: appears compensated. Monitor I/Os   Morbid obesity: BMI 42.7. Complicates overall care & prognosis    Tremor: continue on propranolol    HTN: continue on propranolol. Holding HCTZ     DVT prophylaxis: SCDs Code Status:   full  Family Communication: discussed pt's care w/ pt's family at bedside and answered their questions  Disposition Plan: likely d/c home w/ HH   Level of care: Med-Surg  Status is: Inpatient Remains inpatient appropriate because: Hb is dropping. Can likely d/c home tomorrow if Hb is stable or trending up     Consultants:  Vasc  surg   Procedures:   Antimicrobials:    Subjective: Pt c/o malaise    Objective: Vitals:   10/27/21 0032 10/27/21 0358 10/27/21 0500 10/27/21 0828  BP: (!) 129/95 (!) 126/47  (!) 116/59  Pulse: 70 (!) 54  (!) 53  Resp: '19 18  16  '$ Temp: (!) 97.5 F (36.4 C) 97.7 F (36.5 C)  98.5 F (36.9 C)  TempSrc: Oral Oral    SpO2: 100% 100%  99%  Weight:   116.4 kg   Height:        Intake/Output Summary (Last 24 hours) at 10/27/2021 0831 Last data filed at 10/27/2021 0500 Gross per 24 hour  Intake 700 ml  Output 1475 ml  Net -775 ml   Filed Weights   10/24/21 0431 10/25/21 0353 10/27/21 0500  Weight: 113.9 kg 114.9 kg 116.4 kg    Examination:  General exam: Appears comfortable  Respiratory system: Clear breath sounds b/l  Cardiovascular system: S1/S2+. No rubs or clicks  Gastrointestinal system: abd is soft, NT, obese & hypoactive bowel sounds  Central nervous system: alert and oriented. Moves all extremities  Psychiatry: Judgement and insight appears at baseline. Appropriate mood and affect     Data Reviewed: I have personally reviewed following labs and imaging studies  CBC: Recent Labs  Lab 10/23/21 0703 10/23/21 1309 10/23/21 1604 10/24/21 0438 10/24/21 1340 10/25/21 0445 10/25/21 1437 10/26/21 0512 10/27/21 0418  WBC 10.4   < >  10.0 9.1  --  10.2  --  11.9* 11.6*  NEUTROABS 7.6  --   --   --   --   --   --   --   --   HGB 10.7*   < > 8.5* 8.4* 8.3* 7.4* 8.3* 8.0* 7.8*  HCT 34.3*   < > 27.9* 27.2*  --  24.2*  --  25.7* 24.8*  MCV 86.2   < > 87.5 87.7  --  87.7  --  88.0 88.6  PLT 311   < > 267 260  --  217  --  253 294   < > = values in this interval not displayed.   Basic Metabolic Panel: Recent Labs  Lab 10/23/21 0703 10/24/21 0438 10/25/21 0445 10/27/21 0418  NA 138 138 138 138  K 2.8* 3.9 3.4* 3.9  CL 100 103 105 106  CO2 '29 30 29 28  '$ GLUCOSE 111* 100* 105* 90  BUN '19 15 10 9  '$ CREATININE 0.82 0.77 0.73 0.56  CALCIUM 8.9 8.0* 7.8* 8.4*   MG 1.7  --  1.8  --    GFR: Estimated Creatinine Clearance: 72.7 mL/min (by C-G formula based on SCr of 0.56 mg/dL). Liver Function Tests: No results for input(s): "AST", "ALT", "ALKPHOS", "BILITOT", "PROT", "ALBUMIN" in the last 168 hours. No results for input(s): "LIPASE", "AMYLASE" in the last 168 hours. No results for input(s): "AMMONIA" in the last 168 hours. Coagulation Profile: Recent Labs  Lab 10/23/21 0703  INR 2.4*   Cardiac Enzymes: No results for input(s): "CKTOTAL", "CKMB", "CKMBINDEX", "TROPONINI" in the last 168 hours. BNP (last 3 results) No results for input(s): "PROBNP" in the last 8760 hours. HbA1C: No results for input(s): "HGBA1C" in the last 72 hours. CBG: Recent Labs  Lab 10/25/21 1113 10/25/21 1530 10/26/21 0803 10/26/21 1113 10/26/21 1617  GLUCAP 127* 130* 97 114* 90   Lipid Profile: No results for input(s): "CHOL", "HDL", "LDLCALC", "TRIG", "CHOLHDL", "LDLDIRECT" in the last 72 hours. Thyroid Function Tests: No results for input(s): "TSH", "T4TOTAL", "FREET4", "T3FREE", "THYROIDAB" in the last 72 hours. Anemia Panel: No results for input(s): "VITAMINB12", "FOLATE", "FERRITIN", "TIBC", "IRON", "RETICCTPCT" in the last 72 hours. Sepsis Labs: Recent Labs  Lab 10/23/21 0703  PROCALCITON <0.10    Recent Results (from the past 240 hour(s))  SARS Coronavirus 2 by RT PCR (hospital order, performed in [facility] hospital lab) *cepheid single result test* Anterior Nasal Swab     Status: None   Collection Time: 10/23/21  7:03 AM   Specimen: Anterior Nasal Swab  Result Value Ref Range Status   SARS Coronavirus 2 by RT PCR NEGATIVE NEGATIVE Final    Comment: (NOTE) SARS-CoV-2 target nucleic acids are NOT DETECTED.  The SARS-CoV-2 RNA is generally detectable in upper and lower respiratory specimens during the acute phase of infection. The lowest concentration of SARS-CoV-2 viral copies this assay can detect is 250 copies / mL. A negative result  does not preclude SARS-CoV-2 infection and should not be used as the sole basis for treatment or other patient management decisions.  A negative result may occur with improper specimen collection / handling, submission of specimen other than nasopharyngeal swab, presence of viral mutation(s) within the areas targeted by this assay, and inadequate number of viral copies (<250 copies / mL). A negative result must be combined with clinical observations, patient history, and epidemiological information.  Fact Sheet for Patients:   https://www.patel.info/  Fact Sheet for Healthcare Providers: https://hall.com/  This  test is not yet approved or  cleared by the [place] and has been authorized for detection and/or diagnosis of SARS-CoV-2 by FDA under an Emergency Use Authorization (EUA).  This EUA will remain in effect (meaning this test can be used) for the duration of the COVID-19 declaration under Section 564(b)(1) of the Act, 21 U.S.C. section 360bbb-3(b)(1), unless the authorization is terminated or revoked sooner.  Performed at [facility], [address] [place], [place] [address]          Radiology Studies: No results found.      Scheduled Meds:  sodium chloride   Intravenous Once   cholecalciferol  500 Units Oral Daily   folic acid  1 mg Oral Daily   gabapentin  100 mg Oral BID   levothyroxine  112 mcg Oral QAC breakfast   methotrexate  20 mg Oral Q Thu   pantoprazole  40 mg Oral Daily   propranolol  40 mg Oral BID   sertraline  150 mg Oral Daily   Continuous Infusions:   LOS: 3 days    Time spent: 25 mins     Wyvonnia Dusky, MD [facility] Pager [phone]  If 7PM-7AM, please contact night-coverage [email or web address] 10/27/2021, 8:31 AM

## 2021-10-27 NOTE — TOC Initial Note (Signed)
Transition of Care [facility]) - Initial/Assessment Note    Patient Details  Name: Glenda Williams MRN: [id number] Date of Birth: 02/23/1941  [facility]) CM/SW Contact:    Candie Chroman, LCSW Phone Number: 10/27/2021, 11:51 AM  Clinical Narrative:   CSW met with patient. Husband at bedside. CSW introduced role and explained that PT recommendations would be discussed. Patient is agreeable to home health. Reviewed CMS scores for agencies that serve her zip code. First preference is [place]. Asked representative to review referral for PT and OT. No DME recommendations.                Expected Discharge Plan: [place] Barriers to Discharge: Continued Medical Work up   Patient Goals and CMS Choice   CMS Medicare.gov Compare Post Acute Care list provided to:: Patient (Husband at bedside)    Expected Discharge Plan and Services Expected Discharge Plan: [place] Choice: [place] arrangements for the past 2 months: Single Family Home                                      Prior Living Arrangements/Services Living arrangements for the past 2 months: Single Family Home Lives with:: Spouse Patient language and need for interpreter reviewed:: Yes Do you feel safe going back to the place where you live?: Yes      Need for Family Participation in Patient Care: Yes (Comment) Care giver support system in place?: Yes (comment)   Criminal Activity/Legal Involvement Pertinent to Current Situation/Hospitalization: No - Comment as needed  Activities of Daily Living Home Assistive Devices/Equipment: Cane (specify quad or straight), Walker (specify type) ADL Screening (condition at time of admission) Patient's cognitive ability adequate to safely complete daily activities?: Yes Is the patient deaf or have difficulty hearing?: No Does the patient have difficulty seeing, even when wearing glasses/contacts?: No Does  the patient have difficulty concentrating, remembering, or making decisions?: No Patient able to express need for assistance with ADLs?: Yes Does the patient have difficulty dressing or bathing?: Yes Independently performs ADLs?: No Communication: Independent Dressing (OT): Needs assistance Is this a change from baseline?: Change from baseline, expected to last >3 days Grooming: Needs assistance Is this a change from baseline?: Change from baseline, expected to last >3 days Feeding: Independent Bathing: Needs assistance Is this a change from baseline?: Change from baseline, expected to last >3 days Toileting: Needs assistance Is this a change from baseline?: Change from baseline, expected to last >3days In/Out Bed: Needs assistance Is this a change from baseline?: Change from baseline, expected to last >3 days Walks in Home: Needs assistance Is this a change from baseline?: Change from baseline, expected to last >3 days Does the patient have difficulty walking or climbing stairs?: Yes Weakness of Legs: Both Weakness of Arms/Hands: None  Permission Sought/Granted Permission sought to share information with : Facility Sport and exercise psychologist, Family Supports Permission granted to share information with : Yes, Verbal Permission Granted  Share Information with NAME: Sarita Hakanson  Permission granted to share info w AGENCY: [place] granted to share info w Relationship: Husband  Permission granted to share info w Contact Information: [phone]  Emotional Assessment Appearance:: Appears stated age Attitude/Demeanor/Rapport: Engaged, Gracious Affect (typically observed): Accepting, Appropriate, Calm, Pleasant Orientation: : Oriented to Self, Oriented to Place, Oriented to  Time, Oriented to Situation Alcohol / Substance Use: Not Applicable Psych Involvement: No (comment)  Admission diagnosis:  Fall, initial encounter [W19.XXXA] Hematoma of right thigh  [S70.11XA] Hematoma of right thigh, initial encounter [S70.11XA] Hematoma [T14.8XXA] Patient Active Problem List   Diagnosis Date Noted   Hematoma 10/24/2021   Hematoma of right thigh 10/23/2021   Fall at home, initial encounter 10/23/2021   Acute blood loss anemia 10/23/2021   Depression with anxiety 10/23/2021   Obesity with body mass index (BMI) of 30.0 to 39.9 10/23/2021   Compression fracture of body of thoracic vertebra_T8, chronic 10/23/2021   HTN (hypertension) 10/23/2021   Chronic diastolic CHF (congestive heart failure) ([place]) 10/23/2021   Poor mobility 08/11/2021   Use of cane as ambulatory aid 08/11/2021   Bilateral leg edema 08/11/2021   Weight gain finding 08/11/2021   Low HDL (under 40) 08/11/2021   Neuropathy 08/11/2021   Bilateral flank pain 11/11/2020   Current use of long term anticoagulation 05/07/2019   Dizziness 05/07/2019   Hypertension 04/26/2015   Dry mouth 04/26/2015   Morbid obesity ([place]) 09/04/2014   Hypokalemia 08/12/2014   Allergic rhinitis 08/04/2014   Anxiety 08/04/2014   Cannot sleep 08/04/2014   Neuropathic pain 08/04/2014   Atrial fibrillation ([place]) 08/04/2014   Avitaminosis D 08/04/2014   Gastroesophageal reflux disease without esophagitis 11/07/2013   Polypharmacy 08/19/2013   Osteoarthritis 06/11/2013   Rheumatoid arthritis ([place]) 06/11/2013   Hypercholesteremia 05/12/2009   Benign essential tremor 05/12/2009   Prediabetes 08/26/2008   Adult hypothyroidism 08/26/2008   Menopausal symptom 08/26/2008   PCP:  Virginia Crews, MD Pharmacy:   [facility], [address] [place] [address] Phone: [phone] Fax: [phone]  [facility] ([place], [place] [facility] [place] [place] [address] [place] [address] Phone: [phone] Fax: [place] [id number] [address], [address] [place] [address] Phone: [phone] Fax: [facility] ([facility]) - [place], [address] [place] [address] Phone: [phone] Fax: [phone]     Social Determinants of Health (SDOH) Interventions    Readmission Risk Interventions     No data to display

## 2021-10-27 NOTE — Care Management Important Message (Signed)
Important Message  Patient Details  Name: ZARINA PE MRN: [id number] Date of Birth: 1940/09/23   Medicare Important Message Given:  Yes     Dannette Barbara 10/27/2021, 12:55 PM

## 2021-10-27 NOTE — Evaluation (Signed)
Occupational Therapy Evaluation Patient Details Name: Glenda Williams MRN: [id number] DOB: [birth year] Today's Date: 10/27/2021   History of Present Illness Pt admitted for hematoma of R thigh with complaints of fall. History includes Afib, HTN, HLD, RA, and Bell's palsy. Of note, pt has been on active bedrest, cleared to perform graded activity as tolerated perf vascular MD.   Clinical Impression   Ms Sabine was seen for OT evaluation this date. Prior to hospital admission, pt was MOD I for mobility and ADLs using 4WW. Pt lives with spouse. Pt presents to acute OT demonstrating impaired ADL performance and functional mobility 2/2 decreased activity tolerance and functional strength/ROM deficits. Pt currently requires SUPERVISION + rail use to exit bed. SBA + RW for toilet t/f and hand washing in standing, grab bar use from low toilet height. MAX A pericare in standing - reports husband will assist. Pt and family educated re: AE/DME (BSC, shower seat, grab bars, bed rail), falls prevention strategies, and toileting schedule. Pt would benefit from skilled OT to address noted impairments and functional limitations (see below for any additional details). Upon hospital discharge, recommend no OT follow up.   Recommendations for follow up therapy are one component of a multi-disciplinary discharge planning process, led by the attending physician.  Recommendations may be updated based on patient status, additional functional criteria and insurance authorization.   Follow Up Recommendations  No OT follow up    Assistance Recommended at Discharge Intermittent Supervision/Assistance  Patient can return home with the following A little help with walking and/or transfers;A little help with bathing/dressing/bathroom    Functional Status Assessment  Patient has had a recent decline in their functional status and demonstrates the ability to make significant improvements in function in a reasonable and  predictable amount of time.  Equipment Recommendations  None recommended by OT    Recommendations for Other Services       Precautions / Restrictions Precautions Precautions: Fall Restrictions Weight Bearing Restrictions: No      Mobility Bed Mobility Overal bed mobility: Needs Assistance Bed Mobility: Supine to Sit     Supine to sit: Supervision     General bed mobility comments: increased time and rail use    Transfers Overall transfer level: Needs assistance Equipment used: Rolling walker (2 wheels) Transfers: Sit to/from Stand Sit to Stand: Min guard           General transfer comment: rises from low toilet using grab bar      Balance Overall balance assessment: Needs assistance Sitting-balance support: Feet supported Sitting balance-Leahy Scale: Good     Standing balance support: No upper extremity supported, During functional activity Standing balance-Leahy Scale: Fair                             ADL either performed or assessed with clinical judgement   ADL Overall ADL's : Needs assistance/impaired                                       General ADL Comments: SBA + RW for toilet t/f and hand washing in standing. MAX A pericare in standing - reports husband will assist.      Pertinent Vitals/Pain Pain Assessment Pain Assessment: No/denies pain     Hand Dominance     Extremity/Trunk Assessment Upper Extremity Assessment Upper Extremity Assessment: Generalized weakness  Lower Extremity Assessment Lower Extremity Assessment: Generalized weakness       Communication Communication Communication: No difficulties   Cognition Arousal/Alertness: Awake/alert Behavior During Therapy: WFL for tasks assessed/performed Overall Cognitive Status: Within Functional Limits for tasks assessed                                        Home Living Family/patient expects to be discharged to:: Private  residence Living Arrangements: Spouse/significant other Available Help at Discharge: Family Type of Home: House Home Access: Stairs to enter [employer] of Steps: 1 small threshold step to enter   Home Layout: One level     Bathroom Shower/Tub: Occupational psychologist: Handicapped height     Home Equipment: Conservation officer, nature (2 wheels);Cane - single point;BSC/3in1;Rollator (4 wheels);Standard Walker   Additional Comments: has lot of DME in home, doesn't use at baseline      Prior Functioning/Environment Prior Level of Function : Independent/Modified Independent             Mobility Comments: reports she was indep with all mobility PTA          OT Problem List: Decreased strength;Decreased activity tolerance;Decreased range of motion;Impaired balance (sitting and/or standing);Decreased safety awareness      OT Treatment/Interventions: Self-care/ADL training;Therapeutic exercise;Energy conservation;DME and/or AE instruction;Therapeutic activities;Patient/family education;Balance training    OT Goals(Current goals can be found in the care plan section) Acute Rehab OT Goals Patient Stated Goal: to go home OT Goal Formulation: With patient/family Time For Goal Achievement: 11/10/21 Potential to Achieve Goals: Good ADL Goals Pt Will Perform Grooming: with modified independence;standing Pt Will Perform Lower Body Dressing: with modified independence;sit to/from stand Pt Will Transfer to Toilet: with modified independence;ambulating;regular height toilet  OT Frequency: Min 2X/week    Co-evaluation              AM-PAC OT "6 Clicks" Daily Activity     Outcome Measure Help from another person eating meals?: None Help from another person taking care of personal grooming?: A Little Help from another person toileting, which includes using toliet, bedpan, or urinal?: A Lot Help from another person bathing (including washing, rinsing, drying)?: A  Little Help from another person to put on and taking off regular upper body clothing?: None Help from another person to put on and taking off regular lower body clothing?: A Little 6 Click Score: 19   End of Session Equipment Utilized During Treatment: Rolling walker (2 wheels)  Activity Tolerance: Patient tolerated treatment well Patient left: in chair;with call bell/phone within reach;with family/visitor present  OT Visit Diagnosis: Other abnormalities of gait and mobility (R26.89);Muscle weakness (generalized) (M62.81)                Time: 6568-1275 OT Time Calculation (min): 34 min Charges:  OT General Charges $OT Visit: 1 Visit OT Evaluation $OT Eval Moderate Complexity: 1 Mod OT Treatments $Self Care/Home Management : 23-37 mins  Dessie Coma, M.S. OTR/L  10/27/21, 1:39 PM  ascom [phone]

## 2021-10-28 DIAGNOSIS — S7011XA Contusion of right thigh, initial encounter: Secondary | ICD-10-CM | POA: Diagnosis not present

## 2021-10-28 DIAGNOSIS — D62 Acute posthemorrhagic anemia: Secondary | ICD-10-CM | POA: Diagnosis not present

## 2021-10-28 DIAGNOSIS — I480 Paroxysmal atrial fibrillation: Secondary | ICD-10-CM | POA: Diagnosis not present

## 2021-10-28 LAB — CBC
HCT: 25.3 % — ABNORMAL LOW (ref 36.0–46.0)
Hemoglobin: 7.8 g/dL — ABNORMAL LOW (ref 12.0–15.0)
MCH: 27.9 pg (ref 26.0–34.0)
MCHC: 30.8 g/dL (ref 30.0–36.0)
MCV: 90.4 fL (ref 80.0–100.0)
Platelets: 286 10*3/uL (ref 150–400)
RBC: 2.8 MIL/uL — ABNORMAL LOW (ref 3.87–5.11)
RDW: 17.1 % — ABNORMAL HIGH (ref 11.5–15.5)
WBC: 8.1 10*3/uL (ref 4.0–10.5)
nRBC: 0 % (ref 0.0–0.2)

## 2021-10-28 LAB — BASIC METABOLIC PANEL
Anion gap: 6 (ref 5–15)
BUN: 10 mg/dL (ref 8–23)
CO2: 27 mmol/L (ref 22–32)
Calcium: 8.5 mg/dL — ABNORMAL LOW (ref 8.9–10.3)
Chloride: 107 mmol/L (ref 98–111)
Creatinine, Ser: 0.59 mg/dL (ref 0.44–1.00)
GFR, Estimated: >60 mL/min (ref >60)
Glucose, Bld: 91 mg/dL (ref 70–99)
Potassium: 3.8 mmol/L (ref 3.5–5.1)
Sodium: 140 mmol/L (ref 135–145)

## 2021-10-28 LAB — GLUCOSE, CAPILLARY: Glucose-Capillary: 79 mg/dL (ref 70–99)

## 2021-10-28 NOTE — Progress Notes (Signed)
PROGRESS NOTE    Glenda Williams  DGU:[id number] DOB: [birth year] DOA: 10/23/2021 PCP: Virginia Crews, MD    Assessment & Plan:   Principal Problem:   Hematoma of right thigh Active Problems:   Acute blood loss anemia   Fall at home, initial encounter   Hypokalemia   Rheumatoid arthritis ([place])   Depression with anxiety   Adult hypothyroidism   Obesity with body mass index (BMI) of 30.0 to 39.9   Compression fracture of body of thoracic vertebra_T8, chronic   Benign essential tremor   HTN (hypertension)   Atrial fibrillation (HCC)   Morbid obesity (HCC)   Hypertension   Chronic diastolic CHF (congestive heart failure) (HCC)   Hematoma  Assessment and Plan:  Hematoma right thigh: CTA with large hematoma subcu right lateral thigh with pseudoaneurysm. S/p kcentra and TXA. Does not appear to have compartment syndrome. S/p venofer on 8/21. Continue to hold xarelto. Vasc surg following and recs apprec.   Acute blood loss anemia: secondary to above. H&H are stable. Will transfuse if Hb < 7.0    Fall:  PT/OT recs HH   Rheumatoid arthritis: continue on methotrexate, folic acid   Chronic pain: continue on home dose of gabapentin    Major depression: severity unknown. Continue on home dose of sertraline    Likely PAF: continue on propranolol. Holding xarelto   Hypothyroidism: continue on levothyroxine   Chronic diastolic CHF: appears compensated. Monitor I/Os  Morbid obesity: BMI 42.4. Complicates overall care & prognosis    Tremor: continue on propranolol   HTN: continue on BB. Holding HCTZ     DVT prophylaxis: SCDs Code Status:   full  Family Communication: discussed pt's care w/ pt's family at bedside and answered their questions  Disposition Plan: likely d/c home w/ HH   Level of care: Med-Surg  Status is: Inpatient Remains inpatient appropriate because: Hb is stable. Will likely d/c home tomorrow     Consultants:  Vasc surg   Procedures:    Antimicrobials:    Subjective: Pt c/o fatigue   Objective: Vitals:   10/28/21 0418 10/28/21 0617 10/28/21 0755 10/28/21 1142  BP: (!) 130/45  (!) 143/54 127/60  Pulse: 71  62 67  Resp: '20  18 17  '$ Temp: 97.9 F (36.6 C)  98.2 F (36.8 C) 97.8 F (36.6 C)  TempSrc:      SpO2: 99%  100% 100%  Weight:  115.8 kg    Height:        Intake/Output Summary (Last 24 hours) at 10/28/2021 1307 Last data filed at 10/28/2021 1142 Gross per 24 hour  Intake 480 ml  Output 1150 ml  Net -670 ml   Filed Weights   10/25/21 0353 10/27/21 0500 10/28/21 0617  Weight: 114.9 kg 116.4 kg 115.8 kg    Examination:  General exam: Appears calm & comfortable  Respiratory system: clear breath sounds b/l  Cardiovascular system: S1 & S2+. No rubs or clicks  Gastrointestinal system: Abd is soft, NT, obese & normal bowel sounds  Central nervous system: Alert and oriented. Moves all extremities  Psychiatry: Judgement and insight at baseline. Flat mood and affect    Data Reviewed: I have personally reviewed following labs and imaging studies  CBC: Recent Labs  Lab 10/23/21 0703 10/23/21 1309 10/24/21 0438 10/24/21 1340 10/25/21 0445 10/25/21 1437 10/26/21 0512 10/27/21 0418 10/28/21 0437  WBC 10.4   < > 9.1  --  10.2  --  11.9* 11.6* 8.1  NEUTROABS 7.6  --   --   --   --   --   --   --   --  HGB 10.7*   < > 8.4*   < > 7.4* 8.3* 8.0* 7.8* 7.8*  HCT 34.3*   < > 27.2*  --  24.2*  --  25.7* 24.8* 25.3*  MCV 86.2   < > 87.7  --  87.7  --  88.0 88.6 90.4  PLT 311   < > 260  --  217  --  253 294 286   < > = values in this interval not displayed.   Basic Metabolic Panel: Recent Labs  Lab 10/23/21 0703 10/24/21 0438 10/25/21 0445 10/27/21 0418 10/28/21 0437  NA 138 138 138 138 140  K 2.8* 3.9 3.4* 3.9 3.8  CL 100 103 105 106 107  CO2 '29 30 29 28 27  '$ GLUCOSE 111* 100* 105* 90 91  BUN '19 15 10 9 10  '$ CREATININE 0.82 0.77 0.73 0.56 0.59  CALCIUM 8.9 8.0* 7.8* 8.4* 8.5*  MG 1.7  --   1.8  --   --    GFR: Estimated Creatinine Clearance: 72.5 mL/min (by C-G formula based on SCr of 0.59 mg/dL). Liver Function Tests: No results for input(s): "AST", "ALT", "ALKPHOS", "BILITOT", "PROT", "ALBUMIN" in the last 168 hours. No results for input(s): "LIPASE", "AMYLASE" in the last 168 hours. No results for input(s): "AMMONIA" in the last 168 hours. Coagulation Profile: Recent Labs  Lab 10/23/21 0703  INR 2.4*   Cardiac Enzymes: No results for input(s): "CKTOTAL", "CKMB", "CKMBINDEX", "TROPONINI" in the last 168 hours. BNP (last 3 results) No results for input(s): "PROBNP" in the last 8760 hours. HbA1C: No results for input(s): "HGBA1C" in the last 72 hours. CBG: Recent Labs  Lab 10/26/21 1113 10/26/21 1617 10/27/21 0827 10/27/21 1230 10/28/21 0755  GLUCAP 114* 90 86 126* 79   Lipid Profile: No results for input(s): "CHOL", "HDL", "LDLCALC", "TRIG", "CHOLHDL", "LDLDIRECT" in the last 72 hours. Thyroid Function Tests: No results for input(s): "TSH", "T4TOTAL", "FREET4", "T3FREE", "THYROIDAB" in the last 72 hours. Anemia Panel: No results for input(s): "VITAMINB12", "FOLATE", "FERRITIN", "TIBC", "IRON", "RETICCTPCT" in the last 72 hours. Sepsis Labs: Recent Labs  Lab 10/23/21 0703  PROCALCITON <0.10    Recent Results (from the past 240 hour(s))  SARS Coronavirus 2 by RT PCR (hospital order, performed in [facility] hospital lab) *cepheid single result test* Anterior Nasal Swab     Status: None   Collection Time: 10/23/21  7:03 AM   Specimen: Anterior Nasal Swab  Result Value Ref Range Status   SARS Coronavirus 2 by RT PCR NEGATIVE NEGATIVE Final    Comment: (NOTE) SARS-CoV-2 target nucleic acids are NOT DETECTED.  The SARS-CoV-2 RNA is generally detectable in upper and lower respiratory specimens during the acute phase of infection. The lowest concentration of SARS-CoV-2 viral copies this assay can detect is 250 copies / mL. A negative result does not  preclude SARS-CoV-2 infection and should not be used as the sole basis for treatment or other patient management decisions.  A negative result may occur with improper specimen collection / handling, submission of specimen other than nasopharyngeal swab, presence of viral mutation(s) within the areas targeted by this assay, and inadequate number of viral copies (<250 copies / mL). A negative result must be combined with clinical observations, patient history, and epidemiological information.  Fact Sheet for Patients:   https://www.patel.info/  Fact Sheet for Healthcare Providers: https://hall.com/  This test is not yet approved or  cleared by the [place] FDA and has been authorized for detection and/or diagnosis of SARS-CoV-2  by FDA under an Emergency Use Authorization (EUA).  This EUA will remain in effect (meaning this test can be used) for the duration of the COVID-19 declaration under Section 564(b)(1) of the Act, 21 U.S.C. section 360bbb-3(b)(1), unless the authorization is terminated or revoked sooner.  Performed at [facility], [address], [place], [place] [address]          Radiology Studies: No results found.      Scheduled Meds:  cholecalciferol  500 Units Oral Daily   folic acid  1 mg Oral Daily   gabapentin  100 mg Oral BID   levothyroxine  112 mcg Oral QAC breakfast   methotrexate  20 mg Oral Q Thu   pantoprazole  40 mg Oral Daily   propranolol  40 mg Oral BID   sertraline  150 mg Oral Daily   Continuous Infusions:   LOS: 4 days    Time spent: 25 mins     Wyvonnia Dusky, MD [facility] Pager [phone]  If 7PM-7AM, please contact night-coverage [email or web address] 10/28/2021, 1:07 PM

## 2021-10-28 NOTE — Plan of Care (Signed)
  Problem: Education: Goal: Knowledge of General Education information will improve Description Including pain rating scale, medication(s)/side effects and non-pharmacologic comfort measures Outcome: Progressing   Problem: Health Behavior/Discharge Planning: Goal: Ability to manage health-related needs will improve Outcome: Progressing   

## 2021-10-28 NOTE — Progress Notes (Signed)
Physical Therapy Treatment Patient Details Name: Glenda Williams MRN: [id number] DOB: [birth year] Today's Date: 10/28/2021   History of Present Illness Pt admitted for hematoma of R thigh with complaints of fall. History includes Afib, HTN, HLD, RA, and Bell's palsy. Of note, pt has been on active bedrest, cleared to perform graded activity as tolerated perf vascular MD.    PT Comments    Patient is making progress towards meeting PT goals with increased ambulation distance and standing activity tolerance this session. Encouraged patient to continue using rolling walker for safety with walking. She required no physical assistance with mobility today. Anticipate patient can return home with family support and home health PT. Will continue to follow while in the hospital.    Recommendations for follow up therapy are one component of a multi-disciplinary discharge planning process, led by the attending physician.  Recommendations may be updated based on patient status, additional functional criteria and insurance authorization.  Follow Up Recommendations  Home health PT     Assistance Recommended at Discharge Intermittent Supervision/Assistance  Patient can return home with the following A little help with walking and/or transfers;A lot of help with bathing/dressing/bathroom;Assistance with cooking/housework;Assist for transportation;Help with stairs or ramp for entrance   Equipment Recommendations  None recommended by PT    Recommendations for Other Services       Precautions / Restrictions Precautions Precautions: Fall Restrictions Weight Bearing Restrictions: No     Mobility  Bed Mobility Overal bed mobility: Needs Assistance Bed Mobility: Supine to Sit     Supine to sit: Supervision     General bed mobility comments: increased time required. no physical assistance needed.    Transfers Overall transfer level: Needs assistance Equipment used: Rolling walker (2  wheels) Transfers: Sit to/from Stand Sit to Stand: Supervision           General transfer comment: with standing from bed and from toilet. verbal cues for hand placement    Ambulation/Gait Ambulation/Gait assistance: Min guard, Supervision Gait Distance (Feet): 75 Feet Assistive device: Rolling walker (2 wheels) Gait Pattern/deviations: Step-to pattern, Decreased stride length, Wide base of support Gait velocity: decreased     General Gait Details: shoes donned with minimal assistance before walking. she ambulated in the room with Min gaurd initially progressing to supervision with increased ambulation distance. reinforcement provided for rolling walker positioning during walking and importance of using rolling walker for support for fall prevention and safety   Stairs             Wheelchair Mobility    Modified Rankin (Stroke Patients Only)       Balance Overall balance assessment: Needs assistance Sitting-balance support: Feet supported Sitting balance-Leahy Scale: Good Sitting balance - Comments: patient able to complete peri-care after urinating without loss of balance   Standing balance support: During functional activity, Bilateral upper extremity supported Standing balance-Leahy Scale: Fair                              Cognition Arousal/Alertness: Awake/alert Behavior During Therapy: WFL for tasks assessed/performed Overall Cognitive Status: Within Functional Limits for tasks assessed                                          Exercises      General Comments        Pertinent Vitals/Pain  Pain Assessment Pain Assessment: Faces Faces Pain Scale: Hurts a little bit Pain Location: R neck/shoulder Pain Descriptors / Indicators: Sore Pain Intervention(s): Limited activity within patient's tolerance, Monitored during session    Home Living                          Prior Function            PT Goals  (current goals can now be found in the care plan section) Acute Rehab PT Goals Patient Stated Goal: to walk more, to be as independent as possible PT Goal Formulation: With patient Time For Goal Achievement: 11/09/21 Potential to Achieve Goals: Good Progress towards PT goals: Progressing toward goals    Frequency    Min 2X/week      PT Plan Current plan remains appropriate    Co-evaluation              AM-PAC PT "6 Clicks" Mobility   Outcome Measure  Help needed turning from your back to your side while in a flat bed without using bedrails?: A Little Help needed moving from lying on your back to sitting on the side of a flat bed without using bedrails?: A Little Help needed moving to and from a bed to a chair (including a wheelchair)?: A Little Help needed standing up from a chair using your arms (e.g., wheelchair or bedside chair)?: A Little Help needed to walk in hospital room?: A Little Help needed climbing 3-5 steps with a railing? : A Lot 6 Click Score: 17    End of Session Equipment Utilized During Treatment: Gait belt Activity Tolerance: Patient tolerated treatment well Patient left: in chair;with call bell/phone within reach;with nursing/sitter in room (nurse tech in the room) Nurse Communication: Mobility status PT Visit Diagnosis: Muscle weakness (generalized) (M62.81);Difficulty in walking, not elsewhere classified (R26.2)     Time: 8916-9450 PT Time Calculation (min) (ACUTE ONLY): 31 min  Charges:  $Gait Training: 8-22 mins $Therapeutic Activity: 8-22 mins                     Minna Merritts, PT, MPT    Percell Locus 10/28/2021, 11:27 AM

## 2021-10-29 DIAGNOSIS — S7011XA Contusion of right thigh, initial encounter: Secondary | ICD-10-CM | POA: Diagnosis not present

## 2021-10-29 DIAGNOSIS — I480 Paroxysmal atrial fibrillation: Secondary | ICD-10-CM | POA: Diagnosis not present

## 2021-10-29 DIAGNOSIS — D62 Acute posthemorrhagic anemia: Secondary | ICD-10-CM | POA: Diagnosis not present

## 2021-10-29 LAB — BASIC METABOLIC PANEL
Anion gap: 5 (ref 5–15)
BUN: 10 mg/dL (ref 8–23)
CO2: 27 mmol/L (ref 22–32)
Calcium: 8.2 mg/dL — ABNORMAL LOW (ref 8.9–10.3)
Chloride: 107 mmol/L (ref 98–111)
Creatinine, Ser: 0.61 mg/dL (ref 0.44–1.00)
GFR, Estimated: >60 mL/min (ref >60)
Glucose, Bld: 83 mg/dL (ref 70–99)
Potassium: 4.1 mmol/L (ref 3.5–5.1)
Sodium: 139 mmol/L (ref 135–145)

## 2021-10-29 LAB — CBC
HCT: 26.1 % — ABNORMAL LOW (ref 36.0–46.0)
Hemoglobin: 7.9 g/dL — ABNORMAL LOW (ref 12.0–15.0)
MCH: 27.2 pg (ref 26.0–34.0)
MCHC: 30.3 g/dL (ref 30.0–36.0)
MCV: 90 fL (ref 80.0–100.0)
Platelets: 282 10*3/uL (ref 150–400)
RBC: 2.9 MIL/uL — ABNORMAL LOW (ref 3.87–5.11)
RDW: 17.2 % — ABNORMAL HIGH (ref 11.5–15.5)
WBC: 6.5 10*3/uL (ref 4.0–10.5)
nRBC: 0 % (ref 0.0–0.2)

## 2021-10-29 LAB — GLUCOSE, CAPILLARY: Glucose-Capillary: 87 mg/dL (ref 70–99)

## 2021-10-29 MED ORDER — RIVAROXABAN 20 MG PO TABS: 20.0000 mg | ORAL_TABLET | Freq: Every day | ORAL | Status: DC

## 2021-10-29 NOTE — TOC Transition Note (Signed)
Transition of Care [facility]) - CM/SW Discharge Note   Patient Details  Name: Glenda Williams MRN: [id number] Date of Birth: 23-Feb-1941  [facility]) CM/SW Contact:  Izola Price, RN Phone Number: 10/29/2021, 1:08 PM   Clinical Narrative:  8/26: DC today with [facility] via [facility] set up per prior CSW/Cory.  No DME recommended by PT. Confirmed HH via [facility].  Follow up TO DO: Per discharge orders. "F/u w/ PCP w/in 5 days. Will need a repeat CBC w/in 5 days to check Hb & Hct. Continue to hold/not take xarelto until you have seen and discussed w/ your PCP".  Glenda Davies RN CM     Final next level of care: Home w Home Health Services Barriers to Discharge: Barriers Resolved   Patient Goals and CMS Choice   CMS Medicare.gov Compare Post Acute Care list provided to:: Patient (Husband at bedside) Choice offered to / list presented to : Spouse  Discharge Placement                       Discharge Plan and Services     Post Acute Care Choice: Home Health            DME Agency: NA       HH Arranged: PT, OT [place] Agency: [facility]        Social Determinants of Health (SDOH) Interventions     Readmission Risk Interventions     No data to display

## 2021-10-29 NOTE — Progress Notes (Signed)
      Daily Progress Note   Assessment/Planning:   R thigh hematoma due to fall  R thigh skin appears viable No intervention needed   Subjective  - * No surgery found *   R thigh comfortable   Objective   Vitals:   10/28/21 2318 10/29/21 0338 10/29/21 0339 10/29/21 0815  BP: (!) 123/52 (!) 140/53  (!) 141/48  Pulse: (!) 54 (!) 57  60  Resp: '18 18  16  '$ Temp: 97.8 F (36.6 C) (!) 97.5 F (36.4 C)    TempSrc:      SpO2: 93% 94%  100%  Weight:   115.9 kg   Height:         Intake/Output Summary (Last 24 hours) at 10/29/2021 0830 Last data filed at 10/29/2021 0165 Gross per 24 hour  Intake 720 ml  Output 1100 ml  Net -380 ml    VASC R thigh: extensive echymosis, no necrotic skin    Laboratory   CBC    Latest Ref Rng & Units 10/29/2021    3:49 AM 10/28/2021    4:37 AM 10/27/2021    4:18 AM  CBC  WBC 4.0 - 10.5 K/uL 6.5  8.1  11.6   Hemoglobin 12.0 - 15.0 g/dL 7.9  7.8  7.8   Hematocrit 36.0 - 46.0 % 26.1  25.3  24.8   Platelets 150 - 400 K/uL 282  286  294     BMET    Component Value Date/Time   NA 139 10/29/2021 0349   NA 140 08/11/2021 1058   NA 142 02/13/2014 0356   K 4.1 10/29/2021 0349   K 3.8 02/13/2014 0356   CL 107 10/29/2021 0349   CL 107 02/13/2014 0356   CO2 27 10/29/2021 0349   CO2 29 02/13/2014 0356   GLUCOSE 83 10/29/2021 0349   GLUCOSE 92 02/13/2014 0356   BUN 10 10/29/2021 0349   BUN 15 08/11/2021 1058   BUN 14 02/13/2014 0356   CREATININE 0.61 10/29/2021 0349   CREATININE 0.77 02/13/2014 0356   CALCIUM 8.2 (L) 10/29/2021 0349   CALCIUM 8.6 02/13/2014 0356   GFRNONAA >60 10/29/2021 0349   GFRNONAA >60 02/13/2014 0356   GFRAA 77 11/18/2019 1104   GFRAA >60 02/13/2014 0356     Adele Barthel, MD, FACS, FSVS Covering for [facility]: [phone]  10/29/2021, 8:30 AM

## 2021-10-29 NOTE — Discharge Summary (Signed)
Physician Discharge Summary  LOGAN VEGH OYD:[id number] DOB: 14-Mar-1940 DOA: 10/23/2021  PCP: Virginia Crews, MD  Admit date: 10/23/2021 Discharge date: 10/29/2021  Admitted From: home  Disposition:  home w/ home health   Recommendations for Outpatient Follow-up:  Follow up with PCP w/in  5 days Needs CBC w/in 5 days to check Hb & Hct  Home Health: yes Equipment/Devices:   Discharge Condition: stable  CODE STATUS: full  Diet recommendation: Heart Healthy   Brief/Interim Summary: HPI was taken from Dr. Blaine Hamper: Glenda Williams is a 81 y.o. female with medical history significant of A-fib on Xarelto, hypertension, hyperlipidemia, prediabetes, rheumatoid arthritis, Bell's palsy, essential tremor, obesity with BMI 38.45, dCHF, who presents with fall and pain in right upper posterior thigh.   Pt states that she fell accidentally when she was out of bed, going to bathroom at about 3:30 AM.  No loss of consciousness.  She injured her left upper posterior thigh, causing pain which was initially severe, currently mild, sharp, nonradiating.  She has bruise over right upper posterior thigh.  No head or neck injury.  Patient denies chest pain, cough, shortness of breath.  No fever or chills.  Denies nausea, vomiting, diarrhea or abdominal pain.  No symptoms of UTI.  No dark stool or rectal bleeding.  Her last dose of Xarelto was last night.   Patient blood pressure was 132/98 initially, which dropped to 88/47 in ED.  Mental status normal.  Patient was given 1 dose of Kcentra in the ED.   Data reviewed independently and ED Course: pt was found to have hemoglobin 10.7 (11.3 on 08/11/2021), INR 2.4, troponin level 14, BNP 137, negative COVID PCR, GFR> 60, potassium 2.8, magnesium 1.7, temperature normal, heart rate 68, RR 13, oxygen saturation 98% on room air.  CT of head and C-spine negative for acute injury.  Patient placed on PCU for observation.  Dr. Trula Slade of [facility] is consulted.   CT-chest: 1. No  acute cardiopulmonary findings. 2. Coronary artery atherosclerosis. 3. Chronic T8 vertebral body compression fracture. 4. Aortic Atherosclerosis (ICD10-I70.0).   CTA of lower extremity: Large hematoma within the subcutaneous fat of the lateral right thigh. Along the medial aspect of the hematoma there is a pseudoaneurysm, which likely arises off a lateral branch of the right deep femoral artery.   Aortic Atherosclerosis (ICD10-I70.0).   Critical Value/emergent results were called by telephone at the time of interpretation on 10/23/2021 at 10:09 am to provider Dr. Charna Archer, who verbally acknowledged these results.    As per Dr. Jimmye Norman 8/23-8/26/23: Pt was found to have a right thigh hematoma after a fall at home. CTA showed large hematoma subcutaneous right lateral thigh w/ pseudoaneurysm. Pt was given kcentra & TXA. Pt also received venofer on 10/24/21. Pt's home dose of xarelto was held inpatient and was held at time of d/c. H&H have been stable x 48 hrs. Pt will need to get a repeat CBC to check H&H w/in 5 days. Pt verbalized her understanding. For more information, please see previous progress/consult notes.     Discharge Diagnoses:  Principal Problem:   Hematoma of right thigh Active Problems:   Acute blood loss anemia   Fall at home, initial encounter   Hypokalemia   Rheumatoid arthritis ([place])   Depression with anxiety   Adult hypothyroidism   Obesity with body mass index (BMI) of 30.0 to 39.9   Compression fracture of body of thoracic vertebra_T8, chronic   Benign essential tremor   HTN (  hypertension)   Atrial fibrillation (HCC)   Morbid obesity (HCC)   Hypertension   Chronic diastolic CHF (congestive heart failure) (HCC)   Hematoma  Hematoma right thigh: CTA with large hematoma subcu right lateral thigh with pseudoaneurysm. S/p kcentra and TXA. Does not appear to have compartment syndrome. S/p venofer on 8/21. Continue to hold xarelto. Vasc surg following and recs  apprec.    Acute blood loss anemia: secondary to above. H&H are stable. Will transfuse if Hb < 7.0    Fall:  PT/OT recs HH   Rheumatoid arthritis: continue on methotrexate, folic acid   Chronic pain: continue on home dose of gabapentin    Major depression: severity unknown. Continue on home dose of sertraline    Likely PAF: continue on propranolol. Holding xarelto    Hypothyroidism: continue on levothyroxine    Chronic diastolic CHF: appears compensated. Monitor I/Os   Morbid obesity: BMI 42.4. Complicates overall care & prognosis    Tremor: continue on propranolol    HTN: continue on BB. Holding HCTZ  Discharge Instructions  Discharge Instructions     Diet - low sodium heart healthy   Complete by: As directed    Discharge instructions   Complete by: As directed    F/u w/ PCP w/in 5 days. Will need a repeat CBC w/in 5 days to check Hb & Hct. Continue to hold/not take xarelto until you have seen and discussed w/ your PCP   Increase activity slowly   Complete by: As directed       Allergies as of 10/29/2021       Reactions   Bactrim [sulfamethoxazole-trimethoprim] Anaphylaxis   Macrobid [nitrofurantoin Macrocrystal] Rash   Mirabegron Rash   Oxybutynin Rash   Penicillins Rash   Has patient had a PCN reaction causing immediate rash, facial/tongue/throat swelling, SOB or lightheadedness with hypotension: Yes Has patient had a PCN reaction causing severe rash involving mucus membranes or skin necrosis: No Has patient had a PCN reaction that required hospitalization: No Has patient had a PCN reaction occurring within the last 10 years: Unknown If all of the above answers are "NO", then may proceed with Cephalosporin use.        Medication List     TAKE these medications    albuterol 108 (90 Base) MCG/ACT inhaler Commonly known as: VENTOLIN HFA USE 2 INHALATIONS BY MOUTH EVERY 6 HOURS AS NEEDED FOR WHEEZING  OR SHORTNESS OF BREATH What changed: See the new  instructions.   cetirizine 10 MG tablet Commonly known as: ZYRTEC Take 10 mg by mouth daily as needed.   cholecalciferol 10 MCG (400 UNIT) Tabs tablet Commonly known as: VITAMIN D3 Take 400 Units by mouth daily.   EPINEPHrine 0.3 mg/0.3 mL Soaj injection Commonly known as: EPI-PEN Inject 0.3 mLs into the skin once as needed for anaphylaxis.   estradiol 0.1 MG/GM vaginal cream Commonly known as: ESTRACE Apply 0.5 g to the vaginal introitus on Monday, Wednesday and Friday nights   Fish Oil 1000 MG Caps Take 1 capsule by mouth daily.   folic acid 1 MG tablet Commonly known as: FOLVITE Take 1 mg by mouth daily.   gabapentin 100 MG capsule Commonly known as: NEURONTIN Take 100 mg by mouth 3 (three) times daily. Per patient taking BID   hydrochlorothiazide 25 MG tablet Commonly known as: HYDRODIURIL Take 25 mg by mouth daily.   hydrocortisone cream 1 % Apply 1 application topically as needed for itching.   ketoconazole 2 % cream Commonly  known as: NIZORAL Apply 1 application topically daily.   levothyroxine 112 MCG tablet Commonly known as: SYNTHROID Take 1 tablet (112 mcg total) by mouth daily.   methotrexate 2.5 MG tablet Commonly known as: RHEUMATREX Take 20 mg by mouth every Thursday.   nystatin powder Generic drug: nystatin APPLY TOPICALLY TO SKIN FOLDS THREE TIMES DAILY   omeprazole 20 MG capsule Commonly known as: PRILOSEC TAKE 1 CAPSULE BY MOUTH  TWICE DAILY BEFORE MEALS.   potassium chloride SA 20 MEQ tablet Commonly known as: KLOR-CON M Take 1 tablet by mouth  daily   Premarin vaginal cream Generic drug: conjugated estrogens Apply 0.'5mg'$  (pea-sized amount)  just inside the vaginal introitus with a finger-tip on  Monday, Wednesday and Friday nights.   propranolol 40 MG tablet Commonly known as: INDERAL 80 mg in the am and 40 mg at night   Red Yeast Rice 600 MG Caps Take 1 capsule by mouth daily.   rivaroxaban 20 MG Tabs tablet Commonly known  as: XARELTO Take 1 tablet (20 mg total) by mouth daily with supper. Hold this medication until you see your PCP What changed: additional instructions   sertraline 50 MG tablet Commonly known as: ZOLOFT TAKE 3 TABLETS BY MOUTH  DAILY        Allergies  Allergen Reactions   Bactrim [Sulfamethoxazole-Trimethoprim] Anaphylaxis   Macrobid [Nitrofurantoin Macrocrystal] Rash   Mirabegron Rash   Oxybutynin Rash   Penicillins Rash    Has patient had a PCN reaction causing immediate rash, facial/tongue/throat swelling, SOB or lightheadedness with hypotension: Yes Has patient had a PCN reaction causing severe rash involving mucus membranes or skin necrosis: No Has patient had a PCN reaction that required hospitalization: No Has patient had a PCN reaction occurring within the last 10 years: Unknown If all of the above answers are "NO", then may proceed with Cephalosporin use.     Consultations: Vasc surg    Procedures/Studies: CT ANGIO LOW EXTREM RIGHT W &/OR WO CONTRAST  Result Date: 10/23/2021 CLINICAL DATA:  Enlarging right lower extremity hematoma. Evaluate for active extravasation. EXAM: CT ANGIOGRAPHY OF THE BILATERAL LOWEREXTREMITY TECHNIQUE: Multidetector CT imaging of the BILATERAL LOWERwas performed using the standard protocol during bolus administration of intravenous contrast. Multiplanar CT image reconstructions and MIPs were obtained to evaluate the vascular anatomy. RADIATION DOSE REDUCTION: This exam was performed according to the departmental dose-optimization program which includes automated exposure control, adjustment of the mA and/or kV according to patient size and/or use of iterative reconstruction technique. CONTRAST:  150m OMNIPAQUE IOHEXOL 350 MG/ML SOLN COMPARISON:  None Available. FINDINGS: Imaging was performed during the arterial phase of contrast injection from the level of the distal abdominal aorta to just below the bifurcation of the popliteal arteries.  Atherosclerotic calcifications are noted involving the distal abdominal aorta and proximal common iliac arteries. There is no sign of significant stenosis involving the aorta, bilateral iliac arteries, bilateral common femoral arteries, bilateral popliteal arteries, or the bilateral proximal branches of the popliteal arteries.The deep femoral arteries are patent bilaterally. Within the subcutaneous fat of the lateral right thigh there is a heterogeneous mass compatible with hematoma (partially excluded from the field of view laterally). The visualized portions measures 7.1 x 6.2 by 14.2 cm, image 156/5 and image 102/6. Along the medial aspect of the hematoma there is a focal pseudoaneurysm, image 139/5, which most likely reflects active arterial phase extravasation from a lateral branch off the right deep femoral artery. Incidental Findings: Status post bilateral total knee arthroplasty.  Fat containing umbilical hernia noted. Sigmoid diverticulosis without signs acute diverticulitis. Advanced lumbar degenerative disc disease. Review of the MIP images confirms the above findings. IMPRESSION: Large hematoma within the subcutaneous fat of the lateral right thigh. Along the medial aspect of the hematoma there is a pseudoaneurysm, which likely arises off a lateral branch of the right deep femoral artery. Aortic Atherosclerosis (ICD10-I70.0). Critical Value/emergent results were called by telephone at the time of interpretation on 10/23/2021 at 10:09 am to provider Dr. Charna Archer, who verbally acknowledged these results. Electronically Signed   By: Kerby Moors M.D.   On: 10/23/2021 10:10   CT ANGIO LOW EXTREM LEFT W &/OR WO CONTRAST  Result Date: 10/23/2021 CLINICAL DATA:  Enlarging right lower extremity hematoma. Evaluate for active extravasation. EXAM: CT ANGIOGRAPHY OF THE BILATERAL LOWEREXTREMITY TECHNIQUE: Multidetector CT imaging of the BILATERAL LOWERwas performed using the standard protocol during bolus  administration of intravenous contrast. Multiplanar CT image reconstructions and MIPs were obtained to evaluate the vascular anatomy. RADIATION DOSE REDUCTION: This exam was performed according to the departmental dose-optimization program which includes automated exposure control, adjustment of the mA and/or kV according to patient size and/or use of iterative reconstruction technique. CONTRAST:  173m OMNIPAQUE IOHEXOL 350 MG/ML SOLN COMPARISON:  None Available. FINDINGS: Imaging was performed during the arterial phase of contrast injection from the level of the distal abdominal aorta to just below the bifurcation of the popliteal arteries. Atherosclerotic calcifications are noted involving the distal abdominal aorta and proximal common iliac arteries. There is no sign of significant stenosis involving the aorta, bilateral iliac arteries, bilateral common femoral arteries, bilateral popliteal arteries, or the bilateral proximal branches of the popliteal arteries.The deep femoral arteries are patent bilaterally. Within the subcutaneous fat of the lateral right thigh there is a heterogeneous mass compatible with hematoma (partially excluded from the field of view laterally). The visualized portions measures 7.1 x 6.2 by 14.2 cm, image 156/5 and image 102/6. Along the medial aspect of the hematoma there is a focal pseudoaneurysm, image 139/5, which most likely reflects active arterial phase extravasation from a lateral branch off the right deep femoral artery. Incidental Findings: Status post bilateral total knee arthroplasty. Fat containing umbilical hernia noted. Sigmoid diverticulosis without signs acute diverticulitis. Advanced lumbar degenerative disc disease. Review of the MIP images confirms the above findings. IMPRESSION: Large hematoma within the subcutaneous fat of the lateral right thigh. Along the medial aspect of the hematoma there is a pseudoaneurysm, which likely arises off a lateral branch of the  right deep femoral artery. Aortic Atherosclerosis (ICD10-I70.0). Critical Value/emergent results were called by telephone at the time of interpretation on 10/23/2021 at 10:09 am to provider Dr. JCharna Archer who verbally acknowledged these results. Electronically Signed   By: TKerby MoorsM.D.   On: 10/23/2021 10:10   CT Chest W Contrast  Result Date: 10/23/2021 CLINICAL DATA:  Pneumonia, lower extremity vascular trauma. EXAM: CT CHEST WITH CONTRAST TECHNIQUE: Multidetector CT imaging of the chest was performed during intravenous contrast administration. RADIATION DOSE REDUCTION: This exam was performed according to the departmental dose-optimization program which includes automated exposure control, adjustment of the mA and/or kV according to patient size and/or use of iterative reconstruction technique. CONTRAST:  1269mOMNIPAQUE IOHEXOL 350 MG/ML SOLN COMPARISON:  None Available. FINDINGS: Cardiovascular: No significant vascular findings. Normal heart size. No pericardial effusion. Coronary artery atherosclerosis. Thoracic aortic atherosclerosis. Mediastinum/Nodes: No enlarged mediastinal, hilar, or axillary lymph nodes. Thyroid gland, trachea, and esophagus demonstrate no significant findings. Lungs/Pleura: Lungs are  clear. No pleural effusion or pneumothorax. Upper Abdomen: No acute abnormality. Musculoskeletal: No acute osseous abnormality. No aggressive osseous lesion. Chronic T8 vertebral body compression fracture. Anterior bridging osteophytes from T6 through L1 as can be seen with diffuse idiopathic skeletal hyperostosis. IMPRESSION: 1. No acute cardiopulmonary findings. 2. Coronary artery atherosclerosis. 3. Chronic T8 vertebral body compression fracture. 4. Aortic Atherosclerosis (ICD10-I70.0). Electronically Signed   By: Kathreen Devoid M.D.   On: 10/23/2021 09:43   CT HEAD WO CONTRAST (5MM)  Result Date: 10/23/2021 CLINICAL DATA:  Minor head trauma.  Fall from bed EXAM: CT HEAD WITHOUT CONTRAST CT  CERVICAL SPINE WITHOUT CONTRAST TECHNIQUE: Multidetector CT imaging of the head and cervical spine was performed following the standard protocol without intravenous contrast. Multiplanar CT image reconstructions of the cervical spine were also generated. RADIATION DOSE REDUCTION: This exam was performed according to the departmental dose-optimization program which includes automated exposure control, adjustment of the mA and/or kV according to patient size and/or use of iterative reconstruction technique. COMPARISON:  09/13/2006 head CT FINDINGS: CT HEAD FINDINGS Brain: No evidence of swelling, infarction, hemorrhage, hydrocephalus, extra-axial collection or mass lesion/mass effect. Vascular: No hyperdense vessel or unexpected calcification. Skull: Negative for fracture Sinuses/Orbits: No evidence of injury CT CERVICAL SPINE FINDINGS Alignment: No traumatic malalignment Skull base and vertebrae: No acute fracture.  Generalized osteopenia Soft tissues and spinal canal: No prevertebral fluid or swelling. No visible canal hematoma. Disc levels:  Ordinary degenerative changes Upper chest: No acute finding IMPRESSION: No evidence of acute intracranial or cervical spine injury. Electronically Signed   By: Jorje Guild M.D.   On: 10/23/2021 07:00   CT Cervical Spine Wo Contrast  Result Date: 10/23/2021 CLINICAL DATA:  Minor head trauma.  Fall from bed EXAM: CT HEAD WITHOUT CONTRAST CT CERVICAL SPINE WITHOUT CONTRAST TECHNIQUE: Multidetector CT imaging of the head and cervical spine was performed following the standard protocol without intravenous contrast. Multiplanar CT image reconstructions of the cervical spine were also generated. RADIATION DOSE REDUCTION: This exam was performed according to the departmental dose-optimization program which includes automated exposure control, adjustment of the mA and/or kV according to patient size and/or use of iterative reconstruction technique. COMPARISON:  09/13/2006 head  CT FINDINGS: CT HEAD FINDINGS Brain: No evidence of swelling, infarction, hemorrhage, hydrocephalus, extra-axial collection or mass lesion/mass effect. Vascular: No hyperdense vessel or unexpected calcification. Skull: Negative for fracture Sinuses/Orbits: No evidence of injury CT CERVICAL SPINE FINDINGS Alignment: No traumatic malalignment Skull base and vertebrae: No acute fracture.  Generalized osteopenia Soft tissues and spinal canal: No prevertebral fluid or swelling. No visible canal hematoma. Disc levels:  Ordinary degenerative changes Upper chest: No acute finding IMPRESSION: No evidence of acute intracranial or cervical spine injury. Electronically Signed   By: Jorje Guild M.D.   On: 10/23/2021 07:00   DG Chest 2 View  Result Date: 10/23/2021 CLINICAL DATA:  Status post fall.  Complains of right rib cage pain EXAM: CHEST - 2 VIEW COMPARISON:  CT 11/04/2017 FINDINGS: Cardiac enlargement. Aortic atherosclerosis. No pleural effusion or edema. No airspace opacities identified. There is an a airspace opacity on the lateral projection radiograph, likely right middle lobe.Spondylosis identified within the thoracic spine. Unchanged compression fracture within the midthoracic spine. IMPRESSION: 1. Right middle lobe airspace opacity noted on the lateral radiograph concerning for pneumonia. In the absence of any signs or symptoms of infection consider further evaluation with CT of the chest for more definitive characterization and to rule out underlying mass lesion. 2. Cardiac  enlargement and aortic atherosclerosis. Electronically Signed   By: Kerby Moors M.D.   On: 10/23/2021 06:47   DG Sternum  Result Date: 10/23/2021 CLINICAL DATA:  Chest pain after assistance off the ground. EXAM: STERNUM - 1 VIEW COMPARISON:  Chest CT 11/04/2017 FINDINGS: There is no evidence of fracture or other focal bone lesions. Subjective osteopenia. IMPRESSION: No evidence of sternal fracture in the lateral projection.  Electronically Signed   By: Jorje Guild M.D.   On: 10/23/2021 06:47   DG Femur Min 2 Views Right  Result Date: 10/23/2021 CLINICAL DATA:  Right hip and leg pain after fall from bed EXAM: RIGHT FEMUR 2 VIEWS COMPARISON:  Knee radiograph 12/21/2015 FINDINGS: No evidence of fracture. No hip or knee dislocation. Generalized osteopenia. 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 IMPRESSION: No acute finding. Electronically Signed   By: Jorje Guild M.D.   On: 10/23/2021 06:45   DG Hip Unilat W or Wo Pelvis 2-3 Views Right  Result Date: 10/23/2021 CLINICAL DATA:  Fall with right hip pain EXAM: DG HIP (WITH OR WITHOUT PELVIS) 3V RIGHT COMPARISON:  None Available. FINDINGS: No visible hip fracture. No hip dislocation. Ring of osteophytes at the bilateral hip joint. Trochanteric and iliac osteophytes. Generalized osteopenia. IMPRESSION: No acute finding. Electronically Signed   By: Jorje Guild M.D.   On: 10/23/2021 06:44   (Echo, Carotid, EGD, Colonoscopy, ERCP)    Subjective: Pt c/o fatigue    Discharge Exam: Vitals:   10/29/21 0815 10/29/21 1114  BP: (!) 141/48 (!) 127/57  Pulse: 60 (!) 59  Resp: 16 19  Temp:  98.1 F (36.7 C)  SpO2: 100% 100%   Vitals:   10/29/21 0338 10/29/21 0339 10/29/21 0815 10/29/21 1114  BP: (!) 140/53  (!) 141/48 (!) 127/57  Pulse: (!) 57  60 (!) 59  Resp: '18  16 19  '$ Temp: (!) 97.5 F (36.4 C)   98.1 F (36.7 C)  TempSrc:    Oral  SpO2: 94%  100% 100%  Weight:  115.9 kg    Height:        General: Pt is alert, awake, not in acute distress Cardiovascular: S1/S2 +, no rubs, no gallops Respiratory: CTA bilaterally, no wheezing, no rhonchi Abdominal: Soft, NT, obese, bowel sounds + Extremities:  no cyanosis    The results of significant diagnostics from this hospitalization (including imaging, microbiology, ancillary and laboratory) are listed below for  reference.     Microbiology: Recent Results (from the past 240 hour(s))  SARS Coronavirus 2 by RT PCR (hospital order, performed in [facility] hospital lab) *cepheid single result test* Anterior Nasal Swab     Status: None   Collection Time: 10/23/21  7:03 AM   Specimen: Anterior Nasal Swab  Result Value Ref Range Status   SARS Coronavirus 2 by RT PCR NEGATIVE NEGATIVE Final    Comment: (NOTE) SARS-CoV-2 target nucleic acids are NOT DETECTED.  The SARS-CoV-2 RNA is generally detectable in upper and lower respiratory specimens during the acute phase of infection. The lowest concentration of SARS-CoV-2 viral copies this assay can detect is 250 copies / mL. A negative result does not preclude SARS-CoV-2 infection and should not be used as the sole basis for treatment or other patient management decisions.  A negative result may occur with improper specimen collection / handling, submission of specimen other than nasopharyngeal swab, presence of viral mutation(s) within the areas targeted by this  assay, and inadequate number of viral copies (<250 copies / mL). A negative result must be combined with clinical observations, patient history, and epidemiological information.  Fact Sheet for Patients:   https://www.patel.info/  Fact Sheet for Healthcare Providers: https://hall.com/  This test is not yet approved or  cleared by the [place] FDA and has been authorized for detection and/or diagnosis of SARS-CoV-2 by FDA under an Emergency Use Authorization (EUA).  This EUA will remain in effect (meaning this test can be used) for the duration of the COVID-19 declaration under Section 564(b)(1) of the Act, 21 U.S.C. section 360bbb-3(b)(1), unless the authorization is terminated or revoked sooner.  Performed at [facility], [address] [place], [place] [address]      Labs: BNP (last 3 results) Recent Labs     10/23/21 0703  BNP 621.3*   Basic Metabolic Panel: Recent Labs  Lab 10/23/21 0703 10/24/21 0438 10/25/21 0445 10/27/21 0418 10/28/21 0437 10/29/21 0349  NA 138 138 138 138 140 139  K 2.8* 3.9 3.4* 3.9 3.8 4.1  CL 100 103 105 106 107 107  CO2 '29 30 29 28 27 27  '$ GLUCOSE 111* 100* 105* 90 91 83  BUN '19 15 10 9 10 10  '$ CREATININE 0.82 0.77 0.73 0.56 0.59 0.61  CALCIUM 8.9 8.0* 7.8* 8.4* 8.5* 8.2*  MG 1.7  --  1.8  --   --   --    Liver Function Tests: No results for input(s): "AST", "ALT", "ALKPHOS", "BILITOT", "PROT", "ALBUMIN" in the last 168 hours. No results for input(s): "LIPASE", "AMYLASE" in the last 168 hours. No results for input(s): "AMMONIA" in the last 168 hours. CBC: Recent Labs  Lab 10/23/21 0703 10/23/21 1309 10/25/21 0445 10/25/21 1437 10/26/21 0512 10/27/21 0418 10/28/21 0437 10/29/21 0349  WBC 10.4   < > 10.2  --  11.9* 11.6* 8.1 6.5  NEUTROABS 7.6  --   --   --   --   --   --   --   HGB 10.7*   < > 7.4* 8.3* 8.0* 7.8* 7.8* 7.9*  HCT 34.3*   < > 24.2*  --  25.7* 24.8* 25.3* 26.1*  MCV 86.2   < > 87.7  --  88.0 88.6 90.4 90.0  PLT 311   < > 217  --  253 294 286 282   < > = values in this interval not displayed.   Cardiac Enzymes: No results for input(s): "CKTOTAL", "CKMB", "CKMBINDEX", "TROPONINI" in the last 168 hours. BNP: Invalid input(s): "POCBNP" CBG: Recent Labs  Lab 10/26/21 1617 10/27/21 0827 10/27/21 1230 10/28/21 0755 10/29/21 0812  GLUCAP 90 86 126* 79 87   D-Dimer No results for input(s): "DDIMER" in the last 72 hours. Hgb A1c No results for input(s): "HGBA1C" in the last 72 hours. Lipid Profile No results for input(s): "CHOL", "HDL", "LDLCALC", "TRIG", "CHOLHDL", "LDLDIRECT" in the last 72 hours. Thyroid function studies No results for input(s): "TSH", "T4TOTAL", "T3FREE", "THYROIDAB" in the last 72 hours.  Invalid input(s): "FREET3" Anemia work up No results for input(s): "VITAMINB12", "FOLATE", "FERRITIN", "TIBC",  "IRON", "RETICCTPCT" in the last 72 hours. Urinalysis    Component Value Date/Time   COLORURINE YELLOW (A) 10/23/2021 1036   APPEARANCEUR CLEAR (A) 10/23/2021 1036   APPEARANCEUR Cloudy (A) 01/20/2021 0902   LABSPEC 1.026 10/23/2021 1036   PHURINE 5.0 10/23/2021 1036   GLUCOSEU NEGATIVE 10/23/2021 1036   HGBUR SMALL (A) 10/23/2021 1036   BILIRUBINUR NEGATIVE 10/23/2021 1036  BILIRUBINUR Negative 01/20/2021 0902   KETONESUR NEGATIVE 10/23/2021 1036   PROTEINUR NEGATIVE 10/23/2021 1036   UROBILINOGEN >=8.0 (A) 11/11/2020 1334   NITRITE NEGATIVE 10/23/2021 1036   LEUKOCYTESUR TRACE (A) 10/23/2021 1036   Sepsis Labs Recent Labs  Lab 10/26/21 0512 10/27/21 0418 10/28/21 0437 10/29/21 0349  WBC 11.9* 11.6* 8.1 6.5   Microbiology Recent Results (from the past 240 hour(s))  SARS Coronavirus 2 by RT PCR (hospital order, performed in [facility] hospital lab) *cepheid single result test* Anterior Nasal Swab     Status: None   Collection Time: 10/23/21  7:03 AM   Specimen: Anterior Nasal Swab  Result Value Ref Range Status   SARS Coronavirus 2 by RT PCR NEGATIVE NEGATIVE Final    Comment: (NOTE) SARS-CoV-2 target nucleic acids are NOT DETECTED.  The SARS-CoV-2 RNA is generally detectable in upper and lower respiratory specimens during the acute phase of infection. The lowest concentration of SARS-CoV-2 viral copies this assay can detect is 250 copies / mL. A negative result does not preclude SARS-CoV-2 infection and should not be used as the sole basis for treatment or other patient management decisions.  A negative result may occur with improper specimen collection / handling, submission of specimen other than nasopharyngeal swab, presence of viral mutation(s) within the areas targeted by this assay, and inadequate number of viral copies (<250 copies / mL). A negative result must be combined with clinical observations, patient history, and epidemiological information.  Fact  Sheet for Patients:   https://www.patel.info/  Fact Sheet for Healthcare Providers: https://hall.com/  This test is not yet approved or  cleared by the [place] FDA and has been authorized for detection and/or diagnosis of SARS-CoV-2 by FDA under an Emergency Use Authorization (EUA).  This EUA will remain in effect (meaning this test can be used) for the duration of the COVID-19 declaration under Section 564(b)(1) of the Act, 21 U.S.C. section 360bbb-3(b)(1), unless the authorization is terminated or revoked sooner.  Performed at [facility], [address], [place], [place] [address]      Time coordinating discharge: Over 30 minutes  SIGNED:   Wyvonnia Dusky, MD  [facility] 10/29/2021, 1:02 PM Pager   If 7PM-7AM, please contact night-coverage [email or web address]

## 2021-10-31 ENCOUNTER — Telehealth: Payer: Self-pay

## 2021-10-31 NOTE — Telephone Encounter (Signed)
Transition Care Management Follow-up Telephone Call Date of discharge and from where:TCM DC [facility] 10-29-21 Dx: hematoma right thigh   How have you been since you were released from the hospital? Doing ok  Any questions or concerns? No  Items Reviewed: Did the pt receive and understand the discharge instructions provided? Yes  Medications obtained and verified? Yes  Other? no Any new allergies since your discharge? No  Dietary orders reviewed? Yes Do you have support at home? Yes   Home Care and Equipment/Supplies: Were home health services ordered? yes If so, what is the name of the agency? [facility]   Has the agency set up a time to come to the patient's home? no Were any new equipment or medical supplies ordered?  No What is the name of the medical supply agency? na Were you able to get the supplies/equipment? not applicable Do you have any questions related to the use of the equipment or supplies? No  Functional Questionnaire: (I = Independent and D = Dependent) ADLs: I  Bathing/Dressing- I  Meal Prep- I  Eating- I  Maintaining continence- I  Transferring/Ambulation- I-walker  Managing Meds- I  Follow up appointments reviewed:  PCP Hospital f/u appt confirmed? Yes  Scheduled to see Tally Joe NP on 11-03-21 @ [facility] f/u appt confirmed? No  . Are transportation arrangements needed? No  If their condition worsens, is the pt aware to call PCP or go to the Emergency Dept.? Yes Was the patient provided with contact information for the PCP's office or ED? Yes Was to pt encouraged to call back with questions or concerns? Yes

## 2021-11-03 ENCOUNTER — Encounter: Payer: Self-pay | Admitting: Family Medicine

## 2021-11-03 ENCOUNTER — Other Ambulatory Visit: Payer: Self-pay | Admitting: Family Medicine

## 2021-11-03 ENCOUNTER — Ambulatory Visit (INDEPENDENT_AMBULATORY_CARE_PROVIDER_SITE_OTHER): Payer: Medicare Other | Admitting: Family Medicine

## 2021-11-03 VITALS — BP 102/50 | HR 61 | Ht 66.0 in | Wt 225.0 lb

## 2021-11-03 DIAGNOSIS — I10 Essential (primary) hypertension: Secondary | ICD-10-CM

## 2021-11-03 DIAGNOSIS — M1990 Unspecified osteoarthritis, unspecified site: Secondary | ICD-10-CM | POA: Diagnosis not present

## 2021-11-03 DIAGNOSIS — I700 Atherosclerosis of aorta: Secondary | ICD-10-CM | POA: Diagnosis not present

## 2021-11-03 DIAGNOSIS — I2510 Atherosclerotic heart disease of native coronary artery without angina pectoris: Secondary | ICD-10-CM | POA: Diagnosis not present

## 2021-11-03 DIAGNOSIS — E876 Hypokalemia: Secondary | ICD-10-CM | POA: Diagnosis not present

## 2021-11-03 DIAGNOSIS — Z09 Encounter for follow-up examination after completed treatment for conditions other than malignant neoplasm: Secondary | ICD-10-CM | POA: Diagnosis not present

## 2021-11-03 DIAGNOSIS — I724 Aneurysm of artery of lower extremity: Secondary | ICD-10-CM | POA: Diagnosis not present

## 2021-11-03 DIAGNOSIS — F419 Anxiety disorder, unspecified: Secondary | ICD-10-CM | POA: Diagnosis not present

## 2021-11-03 DIAGNOSIS — E039 Hypothyroidism, unspecified: Secondary | ICD-10-CM | POA: Diagnosis not present

## 2021-11-03 DIAGNOSIS — I4891 Unspecified atrial fibrillation: Secondary | ICD-10-CM | POA: Diagnosis not present

## 2021-11-03 DIAGNOSIS — I729 Aneurysm of unspecified site: Secondary | ICD-10-CM | POA: Diagnosis not present

## 2021-11-03 DIAGNOSIS — I5032 Chronic diastolic (congestive) heart failure: Secondary | ICD-10-CM

## 2021-11-03 DIAGNOSIS — Z7901 Long term (current) use of anticoagulants: Secondary | ICD-10-CM | POA: Diagnosis not present

## 2021-11-03 DIAGNOSIS — D62 Acute posthemorrhagic anemia: Secondary | ICD-10-CM | POA: Diagnosis not present

## 2021-11-03 DIAGNOSIS — M4854XD Collapsed vertebra, not elsewhere classified, thoracic region, subsequent encounter for fracture with routine healing: Secondary | ICD-10-CM | POA: Diagnosis not present

## 2021-11-03 DIAGNOSIS — I110 Hypertensive heart disease with heart failure: Secondary | ICD-10-CM | POA: Diagnosis not present

## 2021-11-03 DIAGNOSIS — F329 Major depressive disorder, single episode, unspecified: Secondary | ICD-10-CM | POA: Diagnosis not present

## 2021-11-03 DIAGNOSIS — W19XXXD Unspecified fall, subsequent encounter: Secondary | ICD-10-CM | POA: Diagnosis not present

## 2021-11-03 DIAGNOSIS — E0789 Other specified disorders of thyroid: Secondary | ICD-10-CM

## 2021-11-03 DIAGNOSIS — M069 Rheumatoid arthritis, unspecified: Secondary | ICD-10-CM | POA: Diagnosis not present

## 2021-11-03 DIAGNOSIS — Z6836 Body mass index (BMI) 36.0-36.9, adult: Secondary | ICD-10-CM | POA: Diagnosis not present

## 2021-11-03 DIAGNOSIS — G510 Bell's palsy: Secondary | ICD-10-CM | POA: Diagnosis not present

## 2021-11-03 DIAGNOSIS — S7011XD Contusion of right thigh, subsequent encounter: Secondary | ICD-10-CM | POA: Diagnosis not present

## 2021-11-03 DIAGNOSIS — G250 Essential tremor: Secondary | ICD-10-CM | POA: Diagnosis not present

## 2021-11-03 DIAGNOSIS — G8929 Other chronic pain: Secondary | ICD-10-CM | POA: Diagnosis not present

## 2021-11-03 DIAGNOSIS — R7303 Prediabetes: Secondary | ICD-10-CM | POA: Diagnosis not present

## 2021-11-03 DIAGNOSIS — Z9181 History of falling: Secondary | ICD-10-CM | POA: Diagnosis not present

## 2021-11-03 NOTE — Progress Notes (Signed)
Established patient visit   Patient: Glenda Williams   DOB: Feb 09, 1941   81 y.o. Female  MRN: [id number] Visit Date: 11/03/2021  Today's healthcare provider: [name], FNP  RE Introduced to nurse practitioner role and practice setting.  All questions answered.  Discussed provider/patient relationship and expectations.  I,Tiffany J Bragg,acting as a scribe for [name], FNP.,have documented all relevant documentation on the behalf of [name], FNP,as directed by  [name], FNP while in the presence of [name], FNP.   Chief Complaint  Patient presents with   Hospitalization Follow-up   Subjective    HPI  Follow up Hospitalization  Patient was admitted to [facility] on 10/23/21 and discharged on 8/26. She was treated for fall, hematoma on R thigh. Treatment for this included CTA, kcentra and TXA. Patient was told to have repeat CBC done at PCP visit to check H&H. Was told to hold Xarelto until labs come back.  She reports excellent compliance with treatment. She reports this condition is improved.  ----------------------------------------------------------------------------------------- -   Medications: Outpatient Medications Prior to Visit  Medication Sig   albuterol (VENTOLIN HFA) 108 (90 Base) MCG/ACT inhaler USE 2 INHALATIONS BY MOUTH EVERY 6 HOURS AS NEEDED FOR WHEEZING  OR SHORTNESS OF BREATH   cetirizine (ZYRTEC) 10 MG tablet Take 10 mg by mouth daily as needed.    cholecalciferol (VITAMIN D) 400 units TABS tablet Take 400 Units by mouth daily.   conjugated estrogens (PREMARIN) vaginal cream Apply 0.22m (pea-sized amount)  just inside the vaginal introitus with a finger-tip on  Monday, Wednesday and Friday nights.   EPINEPHrine 0.3 mg/0.3 mL IJ SOAJ injection Inject 0.3 mLs into the skin once as needed for anaphylaxis.   estradiol (ESTRACE) 0.1 MG/GM vaginal cream Apply 0.5 g to the vaginal introitus on Monday, Wednesday and Friday nights   folic acid  (FOLVITE) 1 MG tablet Take 1 mg by mouth daily.   gabapentin (NEURONTIN) 100 MG capsule Take 100 mg by mouth 3 (three) times daily. Per patient taking BID   hydrochlorothiazide (HYDRODIURIL) 25 MG tablet Take 25 mg by mouth daily.   hydrocortisone cream 1 % Apply 1 application topically as needed for itching.   ketoconazole (NIZORAL) 2 % cream Apply 1 application topically daily.   levothyroxine (SYNTHROID) 112 MCG tablet Take 1 tablet (112 mcg total) by mouth daily.   methotrexate (RHEUMATREX) 2.5 MG tablet Take 20 mg by mouth every Thursday.    NYSTATIN powder APPLY TOPICALLY TO SKIN FOLDS THREE TIMES DAILY   Omega-3 Fatty Acids (FISH OIL) 1000 MG CAPS Take 1 capsule by mouth daily.   omeprazole (PRILOSEC) 20 MG capsule TAKE 1 CAPSULE BY MOUTH  TWICE DAILY BEFORE MEALS.   potassium chloride SA (K-DUR,KLOR-CON) 20 MEQ tablet Take 1 tablet by mouth  daily   propranolol (INDERAL) 40 MG tablet 80 mg in the am and 40 mg at night   Red Yeast Rice 600 MG CAPS Take 1 capsule by mouth daily.   rivaroxaban (XARELTO) 20 MG TABS tablet Take 1 tablet (20 mg total) by mouth daily with supper. Hold this medication until you see your PCP   sertraline (ZOLOFT) 50 MG tablet TAKE 3 TABLETS BY MOUTH  DAILY   No facility-administered medications prior to visit.    Review of Systems  Last CBC Lab Results  Component Value Date   WBC 6.5 10/29/2021   HGB 7.9 (L) 10/29/2021   HCT 26.1 (L) 10/29/2021  MCV 90.0 10/29/2021   MCH 27.2 10/29/2021   RDW 17.2 (H) 10/29/2021   PLT 282 47/65/4650   Last metabolic panel Lab Results  Component Value Date   GLUCOSE 95 11/03/2021   NA 140 11/03/2021   K 4.8 11/03/2021   CL 101 11/03/2021   CO2 28 11/03/2021   BUN 11 11/03/2021   CREATININE 0.80 11/03/2021   EGFR 75 11/03/2021   CALCIUM 9.2 11/03/2021   PHOS 2.7 11/05/2017   PROT 6.8 11/03/2021   ALBUMIN 3.5 (L) 11/03/2021   LABGLOB 3.3 11/03/2021   AGRATIO 1.1 (L) 11/03/2021   BILITOT 0.7 11/03/2021    ALKPHOS 115 11/03/2021   AST 17 11/03/2021   ALT 12 11/03/2021   ANIONGAP 5 10/29/2021   Last lipids Lab Results  Component Value Date   CHOL 141 08/11/2021   HDL 35 (L) 08/11/2021   LDLCALC 88 08/11/2021   TRIG 93 08/11/2021   CHOLHDL 4.0 08/11/2021   Last hemoglobin A1c Lab Results  Component Value Date   HGBA1C 5.9 (H) 08/11/2021   Last thyroid functions Lab Results  Component Value Date   TSH 4.760 (H) 11/03/2021   Last vitamin D Lab Results  Component Value Date   VD25OH 43.7 05/07/2019   Last vitamin B12 and Folate No results found for: "VITAMINB12", "FOLATE"     Objective    BP (!) 102/50 (BP Location: Left Arm, Patient Position: Sitting, Cuff Size: Large)   Pulse 61   Ht 5' 6"  (1.676 m)   Wt 225 lb (102.1 kg)   BMI 36.32 kg/m   BP Readings from Last 3 Encounters:  11/03/21 (!) 102/50  10/29/21 (!) 127/57  08/23/21 130/80   Wt Readings from Last 3 Encounters:  11/03/21 225 lb (102.1 kg)  10/29/21 255 lb 8.2 oz (115.9 kg)  08/11/21 231 lb (104.8 kg)   SpO2 Readings from Last 3 Encounters:  10/29/21 100%  08/23/21 98%  08/11/21 100%   Physical Exam Vitals and nursing note reviewed.  Constitutional:      General: She is not in acute distress.    Appearance: Normal appearance. She is obese. She is not ill-appearing, toxic-appearing or diaphoretic.  HENT:     Head: Normocephalic and atraumatic.  Cardiovascular:     Rate and Rhythm: Normal rate and regular rhythm.     Pulses: Normal pulses.     Heart sounds: Normal heart sounds. No murmur heard.    No friction rub. No gallop.  Pulmonary:     Effort: Pulmonary effort is normal. No respiratory distress.     Breath sounds: Normal breath sounds. No stridor. No wheezing, rhonchi or rales.  Chest:     Chest wall: No tenderness.  Musculoskeletal:        General: Swelling present. No tenderness, deformity or signs of injury. Normal range of motion.     Right lower leg: Edema present.     Left lower  leg: Edema present.  Skin:    General: Skin is warm and dry.     Capillary Refill: Capillary refill takes less than 2 seconds.     Coloration: Skin is not jaundiced or pale.     Findings: Bruising present. No erythema, lesion or rash.  Neurological:     General: No focal deficit present.     Mental Status: She is alert and oriented to person, place, and time. Mental status is at baseline.     Cranial Nerves: No cranial nerve deficit.  Sensory: No sensory deficit.     Motor: No weakness.     Coordination: Coordination normal.  Psychiatric:        Mood and Affect: Mood normal.        Behavior: Behavior normal.        Thought Content: Thought content normal.        Judgment: Judgment normal.     Results for orders placed or performed in visit on 11/03/21  TSH + free T4  Result Value Ref Range   TSH 4.760 (H) 0.450 - 4.500 uIU/mL   Free T4 1.38 0.82 - 1.77 ng/dL  Comprehensive metabolic panel  Result Value Ref Range   Glucose 95 70 - 99 mg/dL   BUN 11 8 - 27 mg/dL   Creatinine, Ser 0.80 0.57 - 1.00 mg/dL   eGFR 75 >59 mL/min/1.73   BUN/Creatinine Ratio 14 12 - 28   Sodium 140 134 - 144 mmol/L   Potassium 4.8 3.5 - 5.2 mmol/L   Chloride 101 96 - 106 mmol/L   CO2 28 20 - 29 mmol/L   Calcium 9.2 8.7 - 10.3 mg/dL   Total Protein 6.8 6.0 - 8.5 g/dL   Albumin 3.5 (L) 3.8 - 4.8 g/dL   Globulin, Total 3.3 1.5 - 4.5 g/dL   Albumin/Globulin Ratio 1.1 (L) 1.2 - 2.2   Bilirubin Total 0.7 0.0 - 1.2 mg/dL   Alkaline Phosphatase 115 44 - 121 IU/L   AST 17 0 - 40 IU/L   ALT 12 0 - 32 IU/L  Protime-INR  Result Value Ref Range   INR 1.1 0.9 - 1.2   Prothrombin Time 11.4 9.1 - 12.0 sec  APTT  Result Value Ref Range   aPTT 26 24 - 33 sec  Brain natriuretic peptide  Result Value Ref Range   BNP WILL FOLLOW     Assessment & Plan     Problem List Items Addressed This Visit       Cardiovascular and Mediastinum   Chronic diastolic CHF (congestive heart failure) (HCC)    Chronic,  stable Edema in BLE has returned; discussed elevation and diet modification to assist with albumin       Relevant Orders   B Nat Peptide   Hypertension    Chronic, stable Continue HCTZ to assist at 25; propranolol at 80/40       Pseudoaneurysm (HCC)    R hip/thigh; large hematoma remains Patient remains off her Xarelto due to bleeding  Continue to recommend hold until bruising remains stable/is improving       Relevant Orders   CBC with Differential/Platelet   APTT   INR/PT     Endocrine   Adult hypothyroidism    Chronic, stable Continue 112 mcg      Relevant Orders   TSH + free T4   Other specified disorders of thyroid    Recommend INR given recent fall with bleed s/s xarelto as evidence of r upper leg pseudoaneurysm       Relevant Orders   APTT   INR/PT     Other   Hospital discharge follow-up - Primary    Pt rolled out of bed and fell; causing extensive bruising to RUE s/s xarelto use and development of pseudoaneurysm       Morbid obesity ([place])   Relevant Orders   CBC with Differential/Platelet   Comprehensive Metabolic Panel (CMET)   Return in about 2 weeks (around 11/17/2021) for chonic disease management, repeat CBC.  Vonna Kotyk, FNP, have reviewed all documentation for this visit. The documentation on 11/04/21 for the exam, diagnosis, procedures, and orders are all accurate and complete.  [name], [place] [phone] (phone) [phone] (fax)  [place]

## 2021-11-04 ENCOUNTER — Telehealth: Payer: Self-pay | Admitting: Family Medicine

## 2021-11-04 ENCOUNTER — Other Ambulatory Visit: Payer: Self-pay | Admitting: Family Medicine

## 2021-11-04 ENCOUNTER — Encounter: Payer: Self-pay | Admitting: Family Medicine

## 2021-11-04 DIAGNOSIS — E0789 Other specified disorders of thyroid: Secondary | ICD-10-CM | POA: Insufficient documentation

## 2021-11-04 DIAGNOSIS — Z09 Encounter for follow-up examination after completed treatment for conditions other than malignant neoplasm: Secondary | ICD-10-CM | POA: Insufficient documentation

## 2021-11-04 LAB — COMPREHENSIVE METABOLIC PANEL
ALT: 12 IU/L (ref 0–32)
AST: 17 IU/L (ref 0–40)
Albumin/Globulin Ratio: 1.1 — ABNORMAL LOW (ref 1.2–2.2)
Albumin: 3.5 g/dL — ABNORMAL LOW (ref 3.8–4.8)
Alkaline Phosphatase: 115 IU/L (ref 44–121)
BUN/Creatinine Ratio: 14 (ref 12–28)
BUN: 11 mg/dL (ref 8–27)
Bilirubin Total: 0.7 mg/dL (ref 0.0–1.2)
CO2: 28 mmol/L (ref 20–29)
Calcium: 9.2 mg/dL (ref 8.7–10.3)
Chloride: 101 mmol/L (ref 96–106)
Creatinine, Ser: 0.8 mg/dL (ref 0.57–1.00)
Globulin, Total: 3.3 g/dL (ref 1.5–4.5)
Glucose: 95 mg/dL (ref 70–99)
Potassium: 4.8 mmol/L (ref 3.5–5.2)
Sodium: 140 mmol/L (ref 134–144)
Total Protein: 6.8 g/dL (ref 6.0–8.5)
eGFR: 75 mL/min/{1.73_m2} (ref >59)

## 2021-11-04 LAB — PROTIME-INR
INR: 1.1 (ref 0.9–1.2)
Prothrombin Time: 11.4 s (ref 9.1–12.0)

## 2021-11-04 LAB — TSH+FREE T4
Free T4: 1.38 ng/dL (ref 0.82–1.77)
TSH: 4.76 u[IU]/mL — ABNORMAL HIGH (ref 0.450–4.500)

## 2021-11-04 LAB — APTT: aPTT: 26 s (ref 24–33)

## 2021-11-04 LAB — BRAIN NATRIURETIC PEPTIDE: BNP: 124.1 pg/mL — ABNORMAL HIGH (ref 0.0–100.0)

## 2021-11-04 MED ORDER — LEVOTHYROXINE SODIUM 125 MCG PO TABS: 125.0000 ug | ORAL_TABLET | Freq: Every day | ORAL | 0 refills | Status: DC

## 2021-11-04 NOTE — Assessment & Plan Note (Signed)
Pt rolled out of bed and fell; causing extensive bruising to RUE s/s xarelto use and development of pseudoaneurysm

## 2021-11-04 NOTE — Telephone Encounter (Signed)
Called patient to let her know the script is ready for pickup, as I was unable to complete the online request. Okay for PEC to advise.

## 2021-11-04 NOTE — Progress Notes (Signed)
Recommend titration of thyroid medication to assist.  Blood chemistry has stabilized; however, albumin noted to be low. Continue to recommend protein rich diet to assist.  Clotting times have stabilized.  [name], [address] [place], [place] [address] [phone] (phone) [phone] (fax) [place]

## 2021-11-04 NOTE — Assessment & Plan Note (Signed)
Chronic, stable Continue 112 mcg

## 2021-11-04 NOTE — Telephone Encounter (Signed)
Paper script was written for a purewick. Patient declined script for chair after talking to her.

## 2021-11-04 NOTE — Assessment & Plan Note (Signed)
Chronic, stable Continue HCTZ to assist at 25; propranolol at 80/40

## 2021-11-04 NOTE — Telephone Encounter (Signed)
Home Health Verbal Orders - Caller/Agency: [facility] Callback Number: [phone] Requesting PT Frequency: 2 wk 2 1 wk 5

## 2021-11-04 NOTE — Assessment & Plan Note (Signed)
Chronic, stable Edema in BLE has returned; discussed elevation and diet modification to assist with albumin

## 2021-11-04 NOTE — Assessment & Plan Note (Signed)
R hip/thigh; large hematoma remains Patient remains off her Xarelto due to bleeding  Continue to recommend hold until bruising remains stable/is improving

## 2021-11-04 NOTE — Assessment & Plan Note (Signed)
Recommend INR given recent fall with bleed s/s xarelto as evidence of r upper leg pseudoaneurysm

## 2021-11-08 ENCOUNTER — Ambulatory Visit: Payer: Self-pay

## 2021-11-08 DIAGNOSIS — M069 Rheumatoid arthritis, unspecified: Secondary | ICD-10-CM | POA: Diagnosis not present

## 2021-11-08 DIAGNOSIS — D62 Acute posthemorrhagic anemia: Secondary | ICD-10-CM | POA: Diagnosis not present

## 2021-11-08 DIAGNOSIS — M4854XD Collapsed vertebra, not elsewhere classified, thoracic region, subsequent encounter for fracture with routine healing: Secondary | ICD-10-CM | POA: Diagnosis not present

## 2021-11-08 DIAGNOSIS — S7011XD Contusion of right thigh, subsequent encounter: Secondary | ICD-10-CM | POA: Diagnosis not present

## 2021-11-08 DIAGNOSIS — I724 Aneurysm of artery of lower extremity: Secondary | ICD-10-CM | POA: Diagnosis not present

## 2021-11-08 DIAGNOSIS — G250 Essential tremor: Secondary | ICD-10-CM | POA: Diagnosis not present

## 2021-11-08 NOTE — Telephone Encounter (Signed)
Advised of approved orders for PT.

## 2021-11-08 NOTE — Telephone Encounter (Signed)
   Chief Complaint: Moderate drug interaction between propanolol and albuterol Symptoms: none Frequency:  Pertinent Negatives: Patient denies Any issues Disposition: '[]'$ ED /'[]'$ Urgent Care (no appt availability in office) / '[]'$ Appointment(In office/virtual)/ '[]'$  [place] [facility]/ '[]'$ Home Care/ '[]'$ Refused Recommended Disposition /'[]'$ [place] [facility]/ '[x]'$  Follow-up with PCP Additional Notes: Received call from [facility], calling to report possible drug interaction between propanolol and albuterol. Per their companies guideline this must be reported to PCP.  PT having no issues at this time.  Alvis Lemmings is also requesting a continuation of home care 2 times per week for 2 weeks, and 1 time per week for 5 weeks.  Please advise.      Reason for Disposition  [1] Caller has URGENT medicine question about med that PCP or specialist prescribed AND [2] triager unable to answer question  Answer Assessment - Initial Assessment Questions 1. NAME of MEDICINE: "What medicine(s) are you calling about?"     Propanolol and Albuterol 2. QUESTION: "What is your question?" (e.g., double dose of medicine, side effect)     Moderate drug interaction 3. PRESCRIBER: "Who prescribed the medicine?" Reason: if prescribed by specialist, call should be referred to that group.      4. SYMPTOMS: "Do you have any symptoms?" If Yes, ask: "What symptoms are you having?"  "How bad are the symptoms (e.g., mild, moderate, severe)     none 5. PREGNANCY:  "Is there any chance that you are pregnant?" "When was your last menstrual period?"     na  Protocols used: Medication Question Call-A-AH

## 2021-11-08 NOTE — Telephone Encounter (Signed)
Home Health Verbal Orders - Caller/Agency: [facility] Number: [phone] Requesting home health aide Frequency: 2x2 1x5

## 2021-11-08 NOTE — Telephone Encounter (Signed)
Please advise 

## 2021-11-08 NOTE — Telephone Encounter (Signed)
   Notes to clinic:  pharm states pt requesting 90 day supply. It is a new dose, has appt later this month, please assess.      Requested Prescriptions  Pending Prescriptions Disp Refills   levothyroxine (SYNTHROID) 125 MCG tablet [Pharmacy Med Name: LEVOTHYROXINE 0.'125MG'$  (125MCG) TAB] 90 tablet     Sig: TAKE 1 TABLET(125 MCG) BY MOUTH DAILY     Endocrinology:  Hypothyroid Agents Failed - 11/04/2021 10:11 AM      Failed - TSH in normal range and within 360 days    TSH  Date Value Ref Range Status  11/03/2021 4.760 (H) 0.450 - 4.500 uIU/mL Final         Passed - Valid encounter within last 12 months    Recent Outpatient Visits           5 days ago Hospital discharge follow-up   [facility] [name], FNP   2 months ago Adult hypothyroidism   [facility] [address] [place], Dionne Bucy, MD   2 months ago Poor mobility   [facility] [name], FNP   9 months ago [place] Thedore Mins, [place], PA-C   11 months ago Encounter for [employer] annual wellness exam   [facility] [place], Dionne Bucy, MD       Future Appointments             In 1 week [name], [place], [place]   In 3 months [place], Dionne Bucy, MD [facility], [place]             b

## 2021-11-09 ENCOUNTER — Telehealth: Payer: Self-pay

## 2021-11-09 DIAGNOSIS — D62 Acute posthemorrhagic anemia: Secondary | ICD-10-CM | POA: Diagnosis not present

## 2021-11-09 DIAGNOSIS — M069 Rheumatoid arthritis, unspecified: Secondary | ICD-10-CM | POA: Diagnosis not present

## 2021-11-09 DIAGNOSIS — M4854XD Collapsed vertebra, not elsewhere classified, thoracic region, subsequent encounter for fracture with routine healing: Secondary | ICD-10-CM | POA: Diagnosis not present

## 2021-11-09 DIAGNOSIS — S7011XD Contusion of right thigh, subsequent encounter: Secondary | ICD-10-CM | POA: Diagnosis not present

## 2021-11-09 DIAGNOSIS — G250 Essential tremor: Secondary | ICD-10-CM | POA: Diagnosis not present

## 2021-11-09 DIAGNOSIS — I724 Aneurysm of artery of lower extremity: Secondary | ICD-10-CM | POA: Diagnosis not present

## 2021-11-09 NOTE — Telephone Encounter (Signed)
Called and gave ok for verbal orders per Dr. Alba Cory.

## 2021-11-09 NOTE — Telephone Encounter (Signed)
Pt. Calling to get labs from 11/03/21, have not resulted yet.

## 2021-11-09 NOTE — Telephone Encounter (Signed)
Advised 

## 2021-11-09 NOTE — Telephone Encounter (Signed)
Ok to continue home health requested

## 2021-11-10 ENCOUNTER — Other Ambulatory Visit: Payer: Self-pay

## 2021-11-10 DIAGNOSIS — M069 Rheumatoid arthritis, unspecified: Secondary | ICD-10-CM | POA: Diagnosis not present

## 2021-11-11 DIAGNOSIS — G250 Essential tremor: Secondary | ICD-10-CM | POA: Diagnosis not present

## 2021-11-11 DIAGNOSIS — S7011XD Contusion of right thigh, subsequent encounter: Secondary | ICD-10-CM | POA: Diagnosis not present

## 2021-11-11 DIAGNOSIS — M069 Rheumatoid arthritis, unspecified: Secondary | ICD-10-CM | POA: Diagnosis not present

## 2021-11-11 DIAGNOSIS — M4854XD Collapsed vertebra, not elsewhere classified, thoracic region, subsequent encounter for fracture with routine healing: Secondary | ICD-10-CM | POA: Diagnosis not present

## 2021-11-11 DIAGNOSIS — I724 Aneurysm of artery of lower extremity: Secondary | ICD-10-CM | POA: Diagnosis not present

## 2021-11-11 DIAGNOSIS — D62 Acute posthemorrhagic anemia: Secondary | ICD-10-CM | POA: Diagnosis not present

## 2021-11-11 NOTE — Telephone Encounter (Signed)
Pt. Calling again for CBC results. No results in Epic. Pt. Very concerned because she has been off her Xarelto for "3 weeks." Please advise pt.

## 2021-11-11 NOTE — Telephone Encounter (Signed)
Spoke with patient and explained there was an issue. She has an appt in 10 days and will f/u with her cardiologist if she needs to have labs done again before then.

## 2021-11-14 DIAGNOSIS — S7011XD Contusion of right thigh, subsequent encounter: Secondary | ICD-10-CM | POA: Diagnosis not present

## 2021-11-14 DIAGNOSIS — M4854XD Collapsed vertebra, not elsewhere classified, thoracic region, subsequent encounter for fracture with routine healing: Secondary | ICD-10-CM | POA: Diagnosis not present

## 2021-11-14 DIAGNOSIS — M069 Rheumatoid arthritis, unspecified: Secondary | ICD-10-CM | POA: Diagnosis not present

## 2021-11-14 DIAGNOSIS — D62 Acute posthemorrhagic anemia: Secondary | ICD-10-CM | POA: Diagnosis not present

## 2021-11-14 DIAGNOSIS — G250 Essential tremor: Secondary | ICD-10-CM | POA: Diagnosis not present

## 2021-11-14 DIAGNOSIS — I724 Aneurysm of artery of lower extremity: Secondary | ICD-10-CM | POA: Diagnosis not present

## 2021-11-16 ENCOUNTER — Other Ambulatory Visit: Payer: Self-pay | Admitting: Family Medicine

## 2021-11-16 DIAGNOSIS — I729 Aneurysm of unspecified site: Secondary | ICD-10-CM

## 2021-11-16 DIAGNOSIS — M25511 Pain in right shoulder: Secondary | ICD-10-CM | POA: Diagnosis not present

## 2021-11-16 DIAGNOSIS — M5442 Lumbago with sciatica, left side: Secondary | ICD-10-CM | POA: Diagnosis not present

## 2021-11-16 DIAGNOSIS — M25552 Pain in left hip: Secondary | ICD-10-CM | POA: Diagnosis not present

## 2021-11-16 DIAGNOSIS — G8929 Other chronic pain: Secondary | ICD-10-CM | POA: Diagnosis not present

## 2021-11-16 DIAGNOSIS — M48062 Spinal stenosis, lumbar region with neurogenic claudication: Secondary | ICD-10-CM | POA: Diagnosis not present

## 2021-11-16 DIAGNOSIS — M25512 Pain in left shoulder: Secondary | ICD-10-CM | POA: Diagnosis not present

## 2021-11-16 LAB — CBC
Hematocrit: 31.2 % — ABNORMAL LOW (ref 34.0–46.6)
Hemoglobin: 9.7 g/dL — ABNORMAL LOW (ref 11.1–15.9)
MCH: 28 pg (ref 26.6–33.0)
MCHC: 31.1 g/dL — ABNORMAL LOW (ref 31.5–35.7)
MCV: 90 fL (ref 79–97)
Platelets: 298 10*3/uL (ref 150–450)
RBC: 3.46 x10E6/uL — ABNORMAL LOW (ref 3.77–5.28)
RDW: 16.1 % — ABNORMAL HIGH (ref 11.7–15.4)
WBC: 6.2 10*3/uL (ref 3.4–10.8)

## 2021-11-18 DIAGNOSIS — S7011XD Contusion of right thigh, subsequent encounter: Secondary | ICD-10-CM | POA: Diagnosis not present

## 2021-11-18 DIAGNOSIS — I724 Aneurysm of artery of lower extremity: Secondary | ICD-10-CM | POA: Diagnosis not present

## 2021-11-18 DIAGNOSIS — M4854XD Collapsed vertebra, not elsewhere classified, thoracic region, subsequent encounter for fracture with routine healing: Secondary | ICD-10-CM | POA: Diagnosis not present

## 2021-11-18 DIAGNOSIS — G250 Essential tremor: Secondary | ICD-10-CM | POA: Diagnosis not present

## 2021-11-18 DIAGNOSIS — M069 Rheumatoid arthritis, unspecified: Secondary | ICD-10-CM | POA: Diagnosis not present

## 2021-11-18 DIAGNOSIS — D62 Acute posthemorrhagic anemia: Secondary | ICD-10-CM | POA: Diagnosis not present

## 2021-11-21 ENCOUNTER — Ambulatory Visit (INDEPENDENT_AMBULATORY_CARE_PROVIDER_SITE_OTHER): Payer: Medicare Other | Admitting: Family Medicine

## 2021-11-21 ENCOUNTER — Encounter: Payer: Self-pay | Admitting: Family Medicine

## 2021-11-21 VITALS — BP 122/41 | HR 62 | Resp 16 | Wt 249.1 lb

## 2021-11-21 DIAGNOSIS — I729 Aneurysm of unspecified site: Secondary | ICD-10-CM

## 2021-11-21 MED ORDER — RIVAROXABAN 20 MG PO TABS: 20.0000 mg | ORAL_TABLET | Freq: Every day | ORAL | 5 refills | Status: AC

## 2021-11-21 NOTE — Assessment & Plan Note (Signed)
S/p fall on Xarelto; OK to resume Xarelto 20 mg today with caution for healing R thigh hematoma s/s pseudoaneurysm  Patient and husband verbalized understanding

## 2021-11-21 NOTE — Progress Notes (Signed)
Established patient visit   Patient: Glenda Williams   DOB: [birth year]   81 y.o. Female  MRN: [id number] Visit Date: 11/21/2021  Today's healthcare provider: [name], FNP   I,Tiffany J Bragg,acting as a scribe for [name], FNP.,have documented all relevant documentation on the behalf of [name], FNP,as directed by  [name], FNP while in the presence of [name], FNP.   Chief Complaint  Patient presents with   Hospitalization Follow-up   Subjective    HPI HPI   Patient is here for second hospital f/u. Needs to know if she should start her blood thinner again, and wants to make sure her synthroid dosage is correct, as what she has is 137mg and what is ordered is 1221m.  Last edited by BrSmitty KnudsenCMA on 11/21/2021  2:52 PM.      11/04/21- 125 mcg ordered; patient has not picked up this Rx.  Medications: Outpatient Medications Prior to Visit  Medication Sig   albuterol (VENTOLIN HFA) 108 (90 Base) MCG/ACT inhaler USE 2 INHALATIONS BY MOUTH EVERY 6 HOURS AS NEEDED FOR WHEEZING  OR SHORTNESS OF BREATH   cetirizine (ZYRTEC) 10 MG tablet Take 10 mg by mouth daily as needed.    cholecalciferol (VITAMIN D) 400 units TABS tablet Take 400 Units by mouth daily.   conjugated estrogens (PREMARIN) vaginal cream Apply 0.'5mg'$  (pea-sized amount)  just inside the vaginal introitus with a finger-tip on  Monday, Wednesday and Friday nights.   EPINEPHrine 0.3 mg/0.3 mL IJ SOAJ injection Inject 0.3 mLs into the skin once as needed for anaphylaxis.   estradiol (ESTRACE) 0.1 MG/GM vaginal cream Apply 0.5 g to the vaginal introitus on Monday, Wednesday and Friday nights   folic acid (FOLVITE) 1 MG tablet Take 1 mg by mouth daily.   gabapentin (NEURONTIN) 100 MG capsule Take 100 mg by mouth 3 (three) times daily. Per patient taking BID   hydrochlorothiazide (HYDRODIURIL) 25 MG tablet Take 25 mg by mouth daily.   hydrocortisone cream 1 % Apply 1 application topically as  needed for itching.   ketoconazole (NIZORAL) 2 % cream Apply 1 application topically daily.   levothyroxine (SYNTHROID) 125 MCG tablet Take 1 tablet (125 mcg total) by mouth daily.   methotrexate (RHEUMATREX) 2.5 MG tablet Take 20 mg by mouth every Thursday.    NYSTATIN powder APPLY TOPICALLY TO SKIN FOLDS THREE TIMES DAILY   Omega-3 Fatty Acids (FISH OIL) 1000 MG CAPS Take 1 capsule by mouth daily.   omeprazole (PRILOSEC) 20 MG capsule TAKE 1 CAPSULE BY MOUTH  TWICE DAILY BEFORE MEALS.   potassium chloride SA (K-DUR,KLOR-CON) 20 MEQ tablet Take 1 tablet by mouth  daily   propranolol (INDERAL) 40 MG tablet 80 mg in the am and 40 mg at night   Red Yeast Rice 600 MG CAPS Take 1 capsule by mouth daily.   sertraline (ZOLOFT) 50 MG tablet TAKE 3 TABLETS BY MOUTH  DAILY   [DISCONTINUED] rivaroxaban (XARELTO) 20 MG TABS tablet Take 1 tablet (20 mg total) by mouth daily with supper. Hold this medication until you see your PCP   No facility-administered medications prior to visit.    Review of Systems    Objective    BP (!) 122/41 (BP Location: Right Arm, Patient Position: Sitting, Cuff Size: Large)   Pulse 62   Resp 16   Wt 249 lb 1.6 oz (113 kg)   SpO2 98%  BMI 40.21 kg/m   Physical Exam Vitals and nursing note reviewed.  Constitutional:      General: She is not in acute distress.    Appearance: Normal appearance. She is obese. She is not ill-appearing, toxic-appearing or diaphoretic.  HENT:     Head: Normocephalic and atraumatic.  Cardiovascular:     Rate and Rhythm: Normal rate and regular rhythm.     Pulses: Normal pulses.     Heart sounds: Normal heart sounds. No murmur heard.    No friction rub. No gallop.  Pulmonary:     Effort: Pulmonary effort is normal. No respiratory distress.     Breath sounds: Normal breath sounds. No stridor. No wheezing, rhonchi or rales.  Chest:     Chest wall: No tenderness.  Musculoskeletal:        General: Tenderness and signs of injury  present. No swelling or deformity. Normal range of motion.     Right lower leg: No edema.     Left lower leg: No edema.     Comments: Healing hematoma to R posterior thigh  Skin:    General: Skin is warm and dry.     Capillary Refill: Capillary refill takes less than 2 seconds.     Coloration: Skin is not jaundiced or pale.     Findings: No bruising, erythema, lesion or rash.  Neurological:     General: No focal deficit present.     Mental Status: She is alert and oriented to person, place, and time. Mental status is at baseline.     Cranial Nerves: No cranial nerve deficit.     Sensory: No sensory deficit.     Motor: No weakness.     Coordination: Coordination normal.  Psychiatric:        Mood and Affect: Mood normal.        Behavior: Behavior normal.        Thought Content: Thought content normal.        Judgment: Judgment normal.      No results found for any visits on 11/21/21.  Assessment & Plan     Problem List Items Addressed This Visit       Cardiovascular and Mediastinum   Pseudoaneurysm ([place]) - Primary    S/p fall on Xarelto; OK to resume Xarelto 20 mg today with caution for healing R thigh hematoma s/s pseudoaneurysm  Patient and husband verbalized understanding       Relevant Medications   rivaroxaban (XARELTO) 20 MG TABS tablet     No follow-ups on file.      Vonna Kotyk, FNP, have reviewed all documentation for this visit. The documentation on 11/21/21 for the exam, diagnosis, procedures, and orders are all accurate and complete.    [name], [place] [phone] (phone) [phone] (fax)  [place]

## 2021-11-22 ENCOUNTER — Telehealth: Payer: Self-pay | Admitting: Family Medicine

## 2021-11-22 ENCOUNTER — Telehealth: Payer: Self-pay | Admitting: *Deleted

## 2021-11-22 DIAGNOSIS — M069 Rheumatoid arthritis, unspecified: Secondary | ICD-10-CM | POA: Diagnosis not present

## 2021-11-22 DIAGNOSIS — I724 Aneurysm of artery of lower extremity: Secondary | ICD-10-CM | POA: Diagnosis not present

## 2021-11-22 DIAGNOSIS — G250 Essential tremor: Secondary | ICD-10-CM | POA: Diagnosis not present

## 2021-11-22 DIAGNOSIS — S7011XD Contusion of right thigh, subsequent encounter: Secondary | ICD-10-CM | POA: Diagnosis not present

## 2021-11-22 DIAGNOSIS — D62 Acute posthemorrhagic anemia: Secondary | ICD-10-CM | POA: Diagnosis not present

## 2021-11-22 DIAGNOSIS — M4854XD Collapsed vertebra, not elsewhere classified, thoracic region, subsequent encounter for fracture with routine healing: Secondary | ICD-10-CM | POA: Diagnosis not present

## 2021-11-22 NOTE — Telephone Encounter (Signed)
Cesar from [facility] called "They did not have to send aid because the patient declined. Said she did not need OT or Home aid"  Best contact: [phone] Costella Hatcher)

## 2021-11-22 NOTE — Patient Outreach (Signed)
  Care Coordination   Initial Visit Note   11/22/2021 Name: Glenda Williams MRN: [id number] DOB: [birth year]  Glenda Williams is a 81 y.o. year old female who sees Bacigalupo, Dionne Bucy, MD for primary care. I spoke with  Glenda Williams by phone today.  What matters to the patients health and wellness today?  I do have some concerns but I am one of those people that need it written. Could you please send me a brochure.    Goals Addressed   None     SDOH assessments and interventions completed:  Yes     Care Coordination Interventions Activated:  Yes  Care Coordination Interventions:  Yes, provided  RN sent a [facility] brochure   Follow up plan: No further intervention required.   Encounter Outcome:  Pt. Refused

## 2021-11-28 ENCOUNTER — Telehealth: Payer: Self-pay | Admitting: Family Medicine

## 2021-11-28 NOTE — Telephone Encounter (Signed)
Copied from [place] #[id number]. Topic: Medicare AWV >> Nov 28, 2021 11:38 AM Jae Dire wrote: Reason for CRM:  No answer unable to leave a message for patient to call back and schedule Medicare Annual Wellness Visit (AWV) in office.   If unable to come into the office for AWV,  please offer to do virtually or by telephone.  Last AWV:   11/29/2020  Please schedule at anytime with [facility] Health Advisor.  30 minute appointment for Virtual or phone 45 minute appointment for in office or Initial virtual/phone  Any questions, please contact me at [phone]

## 2021-11-28 NOTE — Telephone Encounter (Signed)
Copied from [place] #[id number]. Topic: Medicare AWV >> Nov 28, 2021 11:38 AM Jae Dire wrote: Reason for CRM:  No answer unable to leave a message for patient to call back and schedule Medicare Annual Wellness Visit (AWV) in office.   If unable to come into the office for AWV,  please offer to do virtually or by telephone.  Last AWV:   11/29/2020  Please schedule at anytime with [facility] Health Advisor.  30 minute appointment for Virtual or phone 45 minute appointment for in office or Initial virtual/phone  Any questions, please contact me at [phone]

## 2021-11-29 ENCOUNTER — Encounter: Payer: Self-pay | Admitting: Family Medicine

## 2021-11-29 DIAGNOSIS — M4854XD Collapsed vertebra, not elsewhere classified, thoracic region, subsequent encounter for fracture with routine healing: Secondary | ICD-10-CM | POA: Diagnosis not present

## 2021-11-29 DIAGNOSIS — I724 Aneurysm of artery of lower extremity: Secondary | ICD-10-CM | POA: Diagnosis not present

## 2021-11-29 DIAGNOSIS — D62 Acute posthemorrhagic anemia: Secondary | ICD-10-CM | POA: Diagnosis not present

## 2021-11-29 DIAGNOSIS — S7011XD Contusion of right thigh, subsequent encounter: Secondary | ICD-10-CM | POA: Diagnosis not present

## 2021-11-29 DIAGNOSIS — M069 Rheumatoid arthritis, unspecified: Secondary | ICD-10-CM | POA: Diagnosis not present

## 2021-11-29 DIAGNOSIS — G250 Essential tremor: Secondary | ICD-10-CM | POA: Diagnosis not present

## 2021-12-02 ENCOUNTER — Other Ambulatory Visit: Payer: Self-pay | Admitting: Family Medicine

## 2021-12-03 DIAGNOSIS — W19XXXD Unspecified fall, subsequent encounter: Secondary | ICD-10-CM | POA: Diagnosis not present

## 2021-12-03 DIAGNOSIS — I5032 Chronic diastolic (congestive) heart failure: Secondary | ICD-10-CM | POA: Diagnosis not present

## 2021-12-03 DIAGNOSIS — Z6836 Body mass index (BMI) 36.0-36.9, adult: Secondary | ICD-10-CM | POA: Diagnosis not present

## 2021-12-03 DIAGNOSIS — G8929 Other chronic pain: Secondary | ICD-10-CM | POA: Diagnosis not present

## 2021-12-03 DIAGNOSIS — I110 Hypertensive heart disease with heart failure: Secondary | ICD-10-CM | POA: Diagnosis not present

## 2021-12-03 DIAGNOSIS — I724 Aneurysm of artery of lower extremity: Secondary | ICD-10-CM | POA: Diagnosis not present

## 2021-12-03 DIAGNOSIS — F419 Anxiety disorder, unspecified: Secondary | ICD-10-CM | POA: Diagnosis not present

## 2021-12-03 DIAGNOSIS — I700 Atherosclerosis of aorta: Secondary | ICD-10-CM | POA: Diagnosis not present

## 2021-12-03 DIAGNOSIS — M069 Rheumatoid arthritis, unspecified: Secondary | ICD-10-CM | POA: Diagnosis not present

## 2021-12-03 DIAGNOSIS — S7011XD Contusion of right thigh, subsequent encounter: Secondary | ICD-10-CM | POA: Diagnosis not present

## 2021-12-03 DIAGNOSIS — Z7901 Long term (current) use of anticoagulants: Secondary | ICD-10-CM | POA: Diagnosis not present

## 2021-12-03 DIAGNOSIS — D62 Acute posthemorrhagic anemia: Secondary | ICD-10-CM | POA: Diagnosis not present

## 2021-12-03 DIAGNOSIS — E876 Hypokalemia: Secondary | ICD-10-CM | POA: Diagnosis not present

## 2021-12-03 DIAGNOSIS — Z9181 History of falling: Secondary | ICD-10-CM | POA: Diagnosis not present

## 2021-12-03 DIAGNOSIS — E039 Hypothyroidism, unspecified: Secondary | ICD-10-CM | POA: Diagnosis not present

## 2021-12-03 DIAGNOSIS — G250 Essential tremor: Secondary | ICD-10-CM | POA: Diagnosis not present

## 2021-12-03 DIAGNOSIS — G510 Bell's palsy: Secondary | ICD-10-CM | POA: Diagnosis not present

## 2021-12-03 DIAGNOSIS — R7303 Prediabetes: Secondary | ICD-10-CM | POA: Diagnosis not present

## 2021-12-03 DIAGNOSIS — I4891 Unspecified atrial fibrillation: Secondary | ICD-10-CM | POA: Diagnosis not present

## 2021-12-03 DIAGNOSIS — F329 Major depressive disorder, single episode, unspecified: Secondary | ICD-10-CM | POA: Diagnosis not present

## 2021-12-03 DIAGNOSIS — M4854XD Collapsed vertebra, not elsewhere classified, thoracic region, subsequent encounter for fracture with routine healing: Secondary | ICD-10-CM | POA: Diagnosis not present

## 2021-12-03 DIAGNOSIS — I2510 Atherosclerotic heart disease of native coronary artery without angina pectoris: Secondary | ICD-10-CM | POA: Diagnosis not present

## 2021-12-03 DIAGNOSIS — M1990 Unspecified osteoarthritis, unspecified site: Secondary | ICD-10-CM | POA: Diagnosis not present

## 2021-12-05 NOTE — Telephone Encounter (Signed)
Unable to refill per protocol, last refill 11/04/21 for 60 days, 0 RF. Patient should have enough until end of October.E-Prescribing Status: Receipt confirmed by pharmacy (11/04/2021 10:06 AM). Will refuse request.  Requested Prescriptions  Pending Prescriptions Disp Refills  . levothyroxine (SYNTHROID) 125 MCG tablet [Pharmacy Med Name: LEVOTHYROXINE 0.'125MG'$  (125MCG) TAB] 90 tablet     Sig: TAKE 1 TABLET(125 MCG) BY MOUTH DAILY     Endocrinology:  Hypothyroid Agents Failed - 12/02/2021  8:14 PM      Failed - TSH in normal range and within 360 days    TSH  Date Value Ref Range Status  11/03/2021 4.760 (H) 0.450 - 4.500 uIU/mL Final         Passed - Valid encounter within last 12 months    Recent Outpatient Visits          2 weeks ago Pseudoaneurysm [facility])   [facility] [name], FNP   1 month ago Hospital discharge follow-up   [facility] [name], FNP   3 months ago Adult hypothyroidism   [facility], Dionne Bucy, MD   3 months ago Poor mobility   [facility] [name], FNP   9 months ago [place] Mikey Kirschner, PA-C      Future Appointments            In 2 months Bacigalupo, Dionne Bucy, MD [facility] [place]

## 2021-12-09 ENCOUNTER — Telehealth: Payer: Self-pay

## 2021-12-09 NOTE — Telephone Encounter (Signed)
Copied from [place] [id number]. Topic: General - Other >> Dec 09, 2021  3:15 PM Cyndi Bender wrote: Reason for CRM: Cesar with [facility] reports patient had missed visit this week. Cb# [id number]

## 2021-12-12 NOTE — Telephone Encounter (Signed)
Noted  

## 2021-12-14 DIAGNOSIS — G250 Essential tremor: Secondary | ICD-10-CM | POA: Diagnosis not present

## 2021-12-14 DIAGNOSIS — S7011XD Contusion of right thigh, subsequent encounter: Secondary | ICD-10-CM | POA: Diagnosis not present

## 2021-12-14 DIAGNOSIS — D62 Acute posthemorrhagic anemia: Secondary | ICD-10-CM | POA: Diagnosis not present

## 2021-12-14 DIAGNOSIS — M4854XD Collapsed vertebra, not elsewhere classified, thoracic region, subsequent encounter for fracture with routine healing: Secondary | ICD-10-CM | POA: Diagnosis not present

## 2021-12-14 DIAGNOSIS — I724 Aneurysm of artery of lower extremity: Secondary | ICD-10-CM | POA: Diagnosis not present

## 2021-12-14 DIAGNOSIS — M069 Rheumatoid arthritis, unspecified: Secondary | ICD-10-CM | POA: Diagnosis not present

## 2021-12-21 DIAGNOSIS — M4854XD Collapsed vertebra, not elsewhere classified, thoracic region, subsequent encounter for fracture with routine healing: Secondary | ICD-10-CM | POA: Diagnosis not present

## 2021-12-21 DIAGNOSIS — G250 Essential tremor: Secondary | ICD-10-CM | POA: Diagnosis not present

## 2021-12-21 DIAGNOSIS — D62 Acute posthemorrhagic anemia: Secondary | ICD-10-CM | POA: Diagnosis not present

## 2021-12-21 DIAGNOSIS — S7011XD Contusion of right thigh, subsequent encounter: Secondary | ICD-10-CM | POA: Diagnosis not present

## 2021-12-21 DIAGNOSIS — M069 Rheumatoid arthritis, unspecified: Secondary | ICD-10-CM | POA: Diagnosis not present

## 2021-12-21 DIAGNOSIS — I724 Aneurysm of artery of lower extremity: Secondary | ICD-10-CM | POA: Diagnosis not present

## 2021-12-29 ENCOUNTER — Ambulatory Visit (INDEPENDENT_AMBULATORY_CARE_PROVIDER_SITE_OTHER): Payer: Medicare Other

## 2021-12-29 VITALS — Ht 66.0 in | Wt 249.0 lb

## 2021-12-29 DIAGNOSIS — Z0000 Encounter for general adult medical examination without abnormal findings: Secondary | ICD-10-CM

## 2021-12-29 NOTE — Patient Instructions (Signed)
Ms. Glenda Williams , Thank you for taking time to come for your Medicare Wellness Visit. I appreciate your ongoing commitment to your health goals. Please review the following plan we discussed and let me know if I can assist you in the future.   Screening recommendations/referrals: Colonoscopy: aged out Mammogram: aged out Bone Density: declined Recommended yearly ophthalmology/optometry visit for glaucoma screening and checkup Recommended yearly dental visit for hygiene and checkup  Vaccinations: Influenza vaccine: 01/01/21 Pneumococcal vaccine: 07/05/17 Tdap vaccine: 04/21/99, due if have injury Shingles vaccine: n/d   Covid-19:03/19/19, 04/16/19,01/01/21  Advanced directives: no  Conditions/risks identified: none  Next appointment: Follow up in one year for your annual wellness visit 01/01/23 @ 11 am by phone   Preventive Care 65 Years and Older, Female Preventive care refers to lifestyle choices and visits with your health care provider that can promote health and wellness. What does preventive care include? A yearly physical exam. This is also called an annual well check. Dental exams once or twice a year. Routine eye exams. Ask your health care provider how often you should have your eyes checked. Personal lifestyle choices, including: Daily care of your teeth and gums. Regular physical activity. Eating a healthy diet. Avoiding tobacco and drug use. Limiting alcohol use. Practicing safe sex. Taking low-dose aspirin every day. Taking vitamin and mineral supplements as recommended by your health care provider. What happens during an annual well check? The services and screenings done by your health care provider during your annual well check will depend on your age, overall health, lifestyle risk factors, and family history of disease. Counseling  Your health care provider may ask you questions about your: Alcohol use. Tobacco use. Drug use. Emotional well-being. Home and  relationship well-being. Sexual activity. Eating habits. History of falls. Memory and ability to understand (cognition). Work and work Statistician. Reproductive health. Screening  You may have the following tests or measurements: Height, weight, and BMI. Blood pressure. Lipid and cholesterol levels. These may be checked every 5 years, or more frequently if you are over 62 years old. Skin check. Lung cancer screening. You may have this screening every year starting at age 20 if you have a 30-pack-year history of smoking and currently smoke or have quit within the past 15 years. Fecal occult blood test (FOBT) of the stool. You may have this test every year starting at age 71. Flexible sigmoidoscopy or colonoscopy. You may have a sigmoidoscopy every 5 years or a colonoscopy every 10 years starting at age 19. Hepatitis C blood test. Hepatitis B blood test. Sexually transmitted disease (STD) testing. Diabetes screening. This is done by checking your blood sugar (glucose) after you have not eaten for a while (fasting). You may have this done every 1-3 years. Bone density scan. This is done to screen for osteoporosis. You may have this done starting at age 80. Mammogram. This may be done every 1-2 years. Talk to your health care provider about how often you should have regular mammograms. Talk with your health care provider about your test results, treatment options, and if necessary, the need for more tests. Vaccines  Your health care provider may recommend certain vaccines, such as: Influenza vaccine. This is recommended every year. Tetanus, diphtheria, and acellular pertussis (Tdap, Td) vaccine. You may need a Td booster every 10 years. Zoster vaccine. You may need this after age 17. Pneumococcal 13-valent conjugate (PCV13) vaccine. One dose is recommended after age 50. Pneumococcal polysaccharide (PPSV23) vaccine. One dose is recommended after age 1.  Talk to your health care provider  about which screenings and vaccines you need and how often you need them. This information is not intended to replace advice given to you by your health care provider. Make sure you discuss any questions you have with your health care provider. Document Released: 03/19/2015 Document Revised: 11/10/2015 Document Reviewed: 12/22/2014 Elsevier Interactive Patient Education  2017 [place] Prevention in the Home Falls can cause injuries. They can happen to people of all ages. There are many things you can do to make your home safe and to help prevent falls. What can I do on the outside of my home? Regularly fix the edges of walkways and driveways and fix any cracks. Remove anything that might make you trip as you walk through a door, such as a raised step or threshold. Trim any bushes or trees on the path to your home. Use bright outdoor lighting. Clear any walking paths of anything that might make someone trip, such as rocks or tools. Regularly check to see if handrails are loose or broken. Make sure that both sides of any steps have handrails. Any raised decks and porches should have guardrails on the edges. Have any leaves, snow, or ice cleared regularly. Use sand or salt on walking paths during winter. Clean up any spills in your garage right away. This includes oil or grease spills. What can I do in the bathroom? Use night lights. Install grab bars by the toilet and in the tub and shower. Do not use towel bars as grab bars. Use non-skid mats or decals in the tub or shower. If you need to sit down in the shower, use a plastic, non-slip stool. Keep the floor dry. Clean up any water that spills on the floor as soon as it happens. Remove soap buildup in the tub or shower regularly. Attach bath mats securely with double-sided non-slip rug tape. Do not have throw rugs and other things on the floor that can make you trip. What can I do in the bedroom? Use night lights. Make sure  that you have a light by your bed that is easy to reach. Do not use any sheets or blankets that are too big for your bed. They should not hang down onto the floor. Have a firm chair that has side arms. You can use this for support while you get dressed. Do not have throw rugs and other things on the floor that can make you trip. What can I do in the kitchen? Clean up any spills right away. Avoid walking on wet floors. Keep items that you use a lot in easy-to-reach places. If you need to reach something above you, use a strong step stool that has a grab bar. Keep electrical cords out of the way. Do not use floor polish or wax that makes floors slippery. If you must use wax, use non-skid floor wax. Do not have throw rugs and other things on the floor that can make you trip. What can I do with my stairs? Do not leave any items on the stairs. Make sure that there are handrails on both sides of the stairs and use them. Fix handrails that are broken or loose. Make sure that handrails are as long as the stairways. Check any carpeting to make sure that it is firmly attached to the stairs. Fix any carpet that is loose or worn. Avoid having throw rugs at the top or bottom of the stairs. If you do have throw  rugs, attach them to the floor with carpet tape. Make sure that you have a light switch at the top of the stairs and the bottom of the stairs. If you do not have them, ask someone to add them for you. What else can I do to help prevent falls? Wear shoes that: Do not have high heels. Have rubber bottoms. Are comfortable and fit you well. Are closed at the toe. Do not wear sandals. If you use a stepladder: Make sure that it is fully opened. Do not climb a closed stepladder. Make sure that both sides of the stepladder are locked into place. Ask someone to hold it for you, if possible. Clearly mark and make sure that you can see: Any grab bars or handrails. First and last steps. Where the edge of  each step is. Use tools that help you move around (mobility aids) if they are needed. These include: Canes. Walkers. Scooters. Crutches. Turn on the lights when you go into a dark area. Replace any light bulbs as soon as they burn out. Set up your furniture so you have a clear path. Avoid moving your furniture around. If any of your floors are uneven, fix them. If there are any pets around you, be aware of where they are. Review your medicines with your doctor. Some medicines can make you feel dizzy. This can increase your chance of falling. Ask your doctor what other things that you can do to help prevent falls. This information is not intended to replace advice given to you by your health care provider. Make sure you discuss any questions you have with your health care provider. Document Released: 12/17/2008 Document Revised: 07/29/2015 Document Reviewed: 03/27/2014 Elsevier Interactive Patient Education  2017 [employer].

## 2021-12-29 NOTE — Progress Notes (Signed)
Virtual Visit via Telephone Note  I connected with  Glenda Williams on 12/29/21 at  9:45 AM EDT by telephone and verified that I am speaking with the correct person using two identifiers.  Location: Patient: home Provider: [facility] Persons participating in the virtual visit: [place]   I discussed the limitations, risks, security and privacy concerns of performing an evaluation and management service by telephone and the availability of in person appointments. The patient expressed understanding and agreed to proceed.  Interactive audio and video telecommunications were attempted between this nurse and patient, however failed, due to patient having technical difficulties OR patient did not have access to video capability.  We continued and completed visit with audio only.  Some vital signs may be absent or patient reported.   Dionisio David, LPN  Subjective:   Glenda Williams is a 81 y.o. female who presents for Medicare Annual (Subsequent) preventive examination.  Review of Systems     Cardiac Risk Factors include: advanced age (>56mn, >>35women);hypertension;dyslipidemia     Objective:    There were no vitals filed for this visit. There is no height or weight on file to calculate BMI.     12/29/2021    9:59 AM 10/23/2021    7:37 PM 10/23/2021    5:27 AM 11/18/2019    9:02 AM 10/18/2018    2:34 PM 07/08/2018    1:31 PM 11/04/2017    2:51 PM  Advanced Directives  Does Patient Have a Medical Advance Directive? No Yes Yes Yes Yes Yes Yes  Type of Advance Directive  Living will;Healthcare Power of Attorney Living will [place] Living will;Healthcare Power of Attorney Living will [place]  Does patient want to make changes to medical advance directive?  No - Patient declined   No - Patient declined  No - Patient declined  Copy of [place]?  No - copy requested  No - copy  requested No - copy requested  No - copy requested  Would patient like information on creating a medical advance directive? No - Patient declined          Current Medications (verified) Outpatient Encounter Medications as of 12/29/2021  Medication Sig   albuterol (VENTOLIN HFA) 108 (90 Base) MCG/ACT inhaler USE 2 INHALATIONS BY MOUTH EVERY 6 HOURS AS NEEDED FOR WHEEZING  OR SHORTNESS OF BREATH   cholecalciferol (VITAMIN D) 400 units TABS tablet Take 400 Units by mouth daily.   conjugated estrogens (PREMARIN) vaginal cream Apply 0.'5mg'$  (pea-sized amount)  just inside the vaginal introitus with a finger-tip on  Monday, Wednesday and Friday nights.   EPINEPHrine 0.3 mg/0.3 mL IJ SOAJ injection Inject 0.3 mLs into the skin once as needed for anaphylaxis.   estradiol (ESTRACE) 0.1 MG/GM vaginal cream Apply 0.5 g to the vaginal introitus on Monday, Wednesday and Friday nights   folic acid (FOLVITE) 1 MG tablet Take 1 mg by mouth daily.   gabapentin (NEURONTIN) 100 MG capsule Take 100 mg by mouth 3 (three) times daily. Per patient taking BID   hydrochlorothiazide (HYDRODIURIL) 25 MG tablet Take 25 mg by mouth daily.   hydrocortisone cream 1 % Apply 1 application topically as needed for itching.   ketoconazole (NIZORAL) 2 % cream Apply 1 application topically daily.   levothyroxine (SYNTHROID) 125 MCG tablet Take 1 tablet (125 mcg total) by mouth daily.   methotrexate (RHEUMATREX) 2.5 MG tablet Take 20 mg by  mouth every Thursday.    NYSTATIN powder APPLY TOPICALLY TO SKIN FOLDS THREE TIMES DAILY   Omega-3 Fatty Acids (FISH OIL) 1000 MG CAPS Take 1 capsule by mouth daily.   omeprazole (PRILOSEC) 20 MG capsule TAKE 1 CAPSULE BY MOUTH  TWICE DAILY BEFORE MEALS.   potassium chloride SA (K-DUR,KLOR-CON) 20 MEQ tablet Take 1 tablet by mouth  daily   propranolol (INDERAL) 40 MG tablet 80 mg in the am and 40 mg at night   Red Yeast Rice 600 MG CAPS Take 1 capsule by mouth daily.   rivaroxaban (XARELTO) 20 MG  TABS tablet Take 1 tablet (20 mg total) by mouth daily with supper. Please report any side effects to R leg following starting including worsening of bruise, new/changed edema, color variation or temperature change.   sertraline (ZOLOFT) 50 MG tablet TAKE 3 TABLETS BY MOUTH  DAILY   cetirizine (ZYRTEC) 10 MG tablet Take 10 mg by mouth daily as needed.  (Patient not taking: Reported on 12/29/2021)   No facility-administered encounter medications on file as of 12/29/2021.    Allergies (verified) Bactrim [sulfamethoxazole-trimethoprim], Macrobid [nitrofurantoin macrocrystal], Mirabegron, Oxybutynin, and Penicillins   History: Past Medical History:  Diagnosis Date   A-fib ([place])    Arthritis    Bell palsy 09/04/2014   GERD (gastroesophageal reflux disease)    Heart disease    Rheumatoid arthritis (HCC)    Thyroid disease    Tremors of nervous system    Past Surgical History:  Procedure Laterality Date   BREAST BIOPSY Left 1988   Benign   BREAST BIOPSY Left 1987   Benign   BREAST EXCISIONAL BIOPSY     BROW LIFT Bilateral 05/23/2016   Procedure: BLEPHAROPLASTY upper eyelid; w/excess skin;  Surgeon: Karle Starch, MD;  Location: [facility];  Service: Ophthalmology;  Laterality: Bilateral;   CARPAL TUNNEL RELEASE Bilateral    CHOLECYSTECTOMY     Dr. Bary Castilla   COLONOSCOPY  2006   JOINT REPLACEMENT Bilateral    knees   REPLACEMENT TOTAL KNEE Left 2005   REPLACEMENT TOTAL KNEE Right 1998   Family History  Problem Relation Age of Onset   Stroke Mother    Hypertension Mother    Heart disease Father    Arthritis Father    Parkinson's disease Sister    Parkinson's disease Brother    Bladder Cancer Brother    Breast cancer Neg Hx    Kidney cancer Neg Hx    Social History   Socioeconomic History   Marital status: Married    Spouse name: Not on file   Number of children: 2   Years of education: Not on file   Highest education level: Some college, no degree  Occupational  History   Not on file  Tobacco Use   Smoking status: Never   Smokeless tobacco: Never  Vaping Use   Vaping Use: Never used  Substance and Sexual Activity   Alcohol use: No   Drug use: No   Sexual activity: Not on file  Other Topics Concern   Not on file  Social History Narrative   Not on file   Social Determinants of Health   Financial Resource Strain: Low Risk  (12/29/2021)   Overall Financial Resource Strain (CARDIA)    Difficulty of Paying Living Expenses: Not hard at all  Food Insecurity: No Food Insecurity (12/29/2021)   Hunger Vital Sign    Worried About Running Out of Food in the Last Year: Never true  Ran Out of Food in the Last Year: Never true  Transportation Needs: No Transportation Needs (12/29/2021)   PRAPARE - Hydrologist (Medical): No    Lack of Transportation (Non-Medical): No  Physical Activity: Sufficiently Active (12/29/2021)   Exercise Vital Sign    Days of Exercise per Week: 3 days    Minutes of Exercise per Session: 60 min  Stress: No Stress Concern Present (12/29/2021)   Dresser    Feeling of Stress : Only a little  Social Connections: Moderately Isolated (12/29/2021)   Social Connection and Isolation Panel [NHANES]    Frequency of Communication with Friends and Family: More than three times a week    Frequency of Social Gatherings with Friends and Family: Once a week    Attends Religious Services: Never    Marine scientist or Organizations: No    Attends Music therapist: Never    Marital Status: Married    Tobacco Counseling Counseling given: Not Answered   Clinical Intake:  Pre-visit preparation completed: Yes  Pain : No/denies pain     Diabetes: No  How often do you need to have someone help you when you read instructions, pamphlets, or other written materials from your doctor or pharmacy?: 1 -  Never  Diabetic?no  Interpreter Needed?: No  Information entered by :: Kirke Shaggy, LPN   Activities of Daily Living    12/29/2021   10:01 AM 12/27/2021    9:14 PM  In your present state of health, do you have any difficulty performing the following activities:  Hearing? 0 0  Vision? 0 1  Difficulty concentrating or making decisions? 0 0  Walking or climbing stairs? 1   Dressing or bathing? 0 1  Doing errands, shopping? 0 1  Preparing Food and eating ? N Y  Using the Toilet? N N  In the past six months, have you accidently leaked urine? N Y  Do you have problems with loss of bowel control? N N  Managing your Medications? N N  Managing your Finances? N N  Housekeeping or managing your Housekeeping? N Y    Patient Care Team: Virginia Crews, MD as PCP - General (Family Medicine) Bary Castilla, Forest Gleason, MD (General Surgery) Ubaldo Glassing Javier Docker, MD as Consulting Physician (Cardiology) Emmaline Kluver., MD as Consulting Physician (Rheumatology) Vladimir Crofts, MD as Consulting Physician (Neurology) Hollice Espy, MD as Consulting Physician (Urology) Dingeldein, Remo Lipps, MD (Ophthalmology) Sunday Corn Criss Alvine Hubbard Hartshorn (Neurology)  Indicate any recent Medical Services you may have received from other than [facility] providers in the past year (date may be approximate).     Assessment:   This is a routine wellness examination for Glenda Williams.  Hearing/Vision screen Hearing Screening - Comments:: No aids Vision Screening - Comments:: Wears glasses- [facility]  Dietary issues and exercise activities discussed: Current Exercise Habits: Home exercise routine, Type of exercise: walking, Time (Minutes): 60, Frequency (Times/Week): 3, Weekly Exercise (Minutes/Week): 180, Intensity: Mild   Goals Addressed             This Visit's Progress    DIET - EAT MORE FRUITS AND VEGETABLES         Depression Screen    12/29/2021    9:56 AM 11/21/2021    2:57 PM 11/03/2021    2:29 PM  02/09/2021    1:30 PM 11/29/2020    2:07 PM 10/07/2020   10:09 AM  05/18/2020   10:38 AM  PHQ 2/9 Scores  PHQ - 2 Score 0 0 0 0 0 0 0  PHQ- 9 Score 0 '2 2 5 4 1 1    '$ Fall Risk    12/29/2021    9:59 AM 12/27/2021    9:14 PM 11/21/2021    2:57 PM 11/03/2021    2:28 PM 02/09/2021    1:32 PM  Fall Risk   Falls in the past year? 0 '1 1 1 '$ 0  Number falls in past yr: 0 0 0 0 0  Injury with Fall? 0 '1 1 1 '$ 0  Risk for fall due to : No Fall Risks    No Fall Risks  Follow up Falls prevention discussed;Falls evaluation completed    Falls evaluation completed    FALL RISK PREVENTION PERTAINING TO THE HOME:  Any stairs in or around the home? No  If so, are there any without handrails? No  Home free of loose throw rugs in walkways, pet beds, electrical cords, etc? Yes  Adequate lighting in your home to reduce risk of falls? Yes   ASSISTIVE DEVICES UTILIZED TO PREVENT FALLS:  Life alert? No  Use of a cane, walker or w/c? Yes  Grab bars in the bathroom? No  Shower chair or bench in shower? No  Elevated toilet seat or a handicapped toilet? No    Cognitive Function:        12/29/2021   10:00 AM 11/29/2020    2:08 PM 11/18/2019    9:07 AM 07/08/2018    1:41 PM 07/05/2017   10:38 AM  6CIT Screen  What Year? 0 points 0 points 0 points 0 points 0 points  What month? 0 points 0 points 0 points 0 points 0 points  What time? 0 points 0 points 0 points 0 points 0 points  Count back from 20 0 points 0 points 0 points 0 points 0 points  Months in reverse 0 points 0 points 0 points 0 points 0 points  Repeat phrase 0 points 0 points 0 points 0 points 0 points  Total Score 0 points 0 points 0 points 0 points 0 points    Immunizations Immunization History  Administered Date(s) Administered   Fluad Quad(high Dose 65+) 12/10/2018, 11/18/2019   Influenza, High Dose Seasonal PF 12/11/2014, 12/13/2016, 12/27/2017   Influenza-Unspecified 11/03/2015, 01/01/2021   Moderna Covid-19 Vaccine Bivalent Booster  45yr & up 01/01/2021   Moderna Sars-Covid-2 Vaccination 03/19/2019, 04/16/2019   Pneumococcal Conjugate-13 02/23/2014   Pneumococcal Polysaccharide-23 07/05/2017   Td 04/21/1999   Td (Adult), 2 Lf Tetanus Toxid, Preservative Free 04/21/1999   Tdap 04/21/1999    TDAP status: Due, Education has been provided regarding the importance of this vaccine. Advised may receive this vaccine at local pharmacy or Health Dept. Aware to provide a copy of the vaccination record if obtained from local pharmacy or Health Dept. Verbalized acceptance and understanding.  Flu Vaccine status: Up to date  Pneumococcal vaccine status: Up to date  Covid-19 vaccine status: Completed vaccines  Qualifies for Shingles Vaccine? Yes   Zostavax completed No   Shingrix Completed?: No.    Education has been provided regarding the importance of this vaccine. Patient has been advised to call insurance company to determine out of pocket expense if they have not yet received this vaccine. Advised may also receive vaccine at local pharmacy or Health Dept. Verbalized acceptance and understanding.  Screening Tests Health Maintenance  Topic Date Due   Zoster Vaccines-  Shingrix (1 of 2) Never done   COVID-19 Vaccine (4 - Moderna risk series) 02/26/2021   INFLUENZA VACCINE  10/04/2021   TETANUS/TDAP  01/25/2023 (Originally 04/20/2009)   Medicare Annual Wellness (AWV)  01/29/2023   Pneumonia Vaccine 2+ Years old  Completed   DEXA SCAN  Completed   HPV VACCINES  Aged Out    Health Maintenance  Health Maintenance Due  Topic Date Due   Zoster Vaccines- Shingrix (1 of 2) Never done   COVID-19 Vaccine (4 - Moderna risk series) 02/26/2021   INFLUENZA VACCINE  10/04/2021    Colorectal cancer screening: No longer required.   Mammogram status: No longer required due to age.  [facility] referral declined  Lung Cancer Screening: (Low Dose CT Chest recommended if Age 66-80 years, 30 pack-year currently smoking OR have quit w/in  15years.) does not qualify.   Additional Screening:  Hepatitis C Screening: does not qualify; Completed no  Vision Screening: Recommended annual ophthalmology exams for early detection of glaucoma and other disorders of the eye. Is the patient up to date with their annual eye exam?  Yes  Who is the provider or what is the name of the office in which the patient attends annual eye exams? [facility] If pt is not established with a provider, would they like to be referred to a provider to establish care? No .   Dental Screening: Recommended annual dental exams for proper oral hygiene  Community Resource Referral / Chronic Care Management: CRR required this visit?  No   CCM required this visit?  No      Plan:     I have personally reviewed and noted the following in the patient's chart:   Medical and social history Use of alcohol, tobacco or illicit drugs  Current medications and supplements including opioid prescriptions. Patient is not currently taking opioid prescriptions. Functional ability and status Nutritional status Physical activity Advanced directives List of other physicians Hospitalizations, surgeries, and ER visits in previous 12 months Vitals Screenings to include cognitive, depression, and falls Referrals and appointments  In addition, I have reviewed and discussed with patient certain preventive protocols, quality metrics, and best practice recommendations. A written personalized care plan for preventive services as well as general preventive health recommendations were provided to patient.     Dionisio David, LPN   [id number]   Nurse Notes: none

## 2022-01-04 DIAGNOSIS — M159 Polyosteoarthritis, unspecified: Secondary | ICD-10-CM | POA: Diagnosis not present

## 2022-01-04 DIAGNOSIS — Z111 Encounter for screening for respiratory tuberculosis: Secondary | ICD-10-CM | POA: Diagnosis not present

## 2022-01-04 DIAGNOSIS — M0609 Rheumatoid arthritis without rheumatoid factor, multiple sites: Secondary | ICD-10-CM | POA: Diagnosis not present

## 2022-01-04 DIAGNOSIS — M069 Rheumatoid arthritis, unspecified: Secondary | ICD-10-CM | POA: Diagnosis not present

## 2022-01-04 DIAGNOSIS — Z7960 Long term (current) use of unspecified immunomodulators and immunosuppressants: Secondary | ICD-10-CM | POA: Diagnosis not present

## 2022-01-05 ENCOUNTER — Other Ambulatory Visit: Payer: Self-pay | Admitting: Family Medicine

## 2022-01-05 ENCOUNTER — Ambulatory Visit: Payer: Self-pay

## 2022-01-05 MED ORDER — LEVOTHYROXINE SODIUM 125 MCG PO TABS: 125.0000 ug | ORAL_TABLET | Freq: Every day | ORAL | 0 refills | Status: DC

## 2022-01-05 NOTE — Telephone Encounter (Signed)
Tried calling pt back. No answer and no vm.

## 2022-01-05 NOTE — Telephone Encounter (Signed)
Pt states her  levothyroxine (SYNTHROID) 125 MCG tablet was sent to [facility]. She says this needs to be sent to  [place], [place]   Also needs to be a 90 day Rx. (This one only 60 tabs)

## 2022-01-05 NOTE — Telephone Encounter (Signed)
Requested Prescriptions  Pending Prescriptions Disp Refills   levothyroxine (SYNTHROID) 125 MCG tablet 90 tablet 0    Sig: Take 1 tablet (125 mcg total) by mouth daily.     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, [place]

## 2022-01-05 NOTE — Telephone Encounter (Signed)
Message from Scherrie Gerlach sent at 01/05/2022  2:22 PM EDT  Summary: question about vaccines    pt wants to know if she needs a booster on her PNA vaccine? Does the dr think she should get the shingrix vaccine?         Called pt back and advised pt that will forward her questions about vaccinations.  Reason for Disposition  [1] Caller requesting NON-URGENT health information AND [2] PCP's office is the best resource  Answer Assessment - Initial Assessment Questions 1. REASON FOR CALL or QUESTION: "What is your reason for calling today?" or "How can I best help you?" or "What question do you have that I can help answer?"     Message from Scherrie Gerlach sent at 01/05/2022  2:22 PM EDT  Summary: question about vaccines    pt wants to know if she needs a booster on her PNA vaccine? Does the dr think she should get the shingrix vaccine?  Protocols used: Information Only Call - No Triage-A-AH

## 2022-01-09 NOTE — Telephone Encounter (Signed)
Patient advised.

## 2022-01-09 NOTE — Telephone Encounter (Signed)
Patient has completed her PNA shots.  She should consider a flu shot, RSV vaccine (Arexvy), and Shingrix.

## 2022-02-22 DIAGNOSIS — I10 Essential (primary) hypertension: Secondary | ICD-10-CM | POA: Diagnosis not present

## 2022-02-22 DIAGNOSIS — Z7901 Long term (current) use of anticoagulants: Secondary | ICD-10-CM | POA: Diagnosis not present

## 2022-02-22 DIAGNOSIS — I38 Endocarditis, valve unspecified: Secondary | ICD-10-CM | POA: Diagnosis not present

## 2022-02-22 DIAGNOSIS — R600 Localized edema: Secondary | ICD-10-CM | POA: Diagnosis not present

## 2022-02-22 DIAGNOSIS — I480 Paroxysmal atrial fibrillation: Secondary | ICD-10-CM | POA: Diagnosis not present

## 2022-02-22 DIAGNOSIS — E7800 Pure hypercholesterolemia, unspecified: Secondary | ICD-10-CM | POA: Diagnosis not present

## 2022-02-22 NOTE — Progress Notes (Unsigned)
I,Sulibeya S Dimas,acting as a Education administrator for Lavon Paganini, MD.,have documented all relevant documentation on the behalf of Lavon Paganini, MD,as directed by  Lavon Paganini, MD while in the presence of Lavon Paganini, MD.     Established patient visit   Patient: Glenda Williams   DOB: 11-22-1940   81 y.o. Female  MRN: [id number] Visit Date: 02/23/2022  Today's healthcare provider: Lavon Paganini, MD   Chief Complaint  Patient presents with   Hypertension   Hypothyroidism   Subjective    HPI  Hypertension, follow-up  BP Readings from Last 3 Encounters:  02/23/22 100/66  11/21/21 (!) 122/41  11/03/21 (!) 102/50   Wt Readings from Last 3 Encounters:  02/23/22 215 lb (97.5 kg)  12/29/21 249 lb (112.9 kg)  11/21/21 249 lb 1.6 oz (113 kg)     She was last seen for hypertension 6 months ago.  BP at that visit was 130/80. Management since that visit includes Continue HCTZ to assist at 25; propranolol at 80/40  .  She reports excellent compliance with treatment.  Outside blood pressures are stable.  Pertinent labs Lab Results  Component Value Date   CHOL 141 08/11/2021   HDL 35 (L) 08/11/2021   LDLCALC 88 08/11/2021   TRIG 93 08/11/2021   CHOLHDL 4.0 08/11/2021   Lab Results  Component Value Date   NA 140 11/03/2021   K 4.8 11/03/2021   CREATININE 0.80 11/03/2021   EGFR 75 11/03/2021   GLUCOSE 95 11/03/2021   TSH 4.760 (H) 11/03/2021     Hypothyroid, follow-up  Lab Results  Component Value Date   TSH 4.760 (H) 11/03/2021   TSH 4.820 (H) 08/11/2021   TSH 2.850 12/07/2020   FREET4 1.38 11/03/2021   FREET4 1.12 08/11/2021    Wt Readings from Last 3 Encounters:  02/23/22 215 lb (97.5 kg)  12/29/21 249 lb (112.9 kg)  11/21/21 249 lb 1.6 oz (113 kg)    She was last seen for hypothyroid 4-6 months ago.  Management since that visit includes Recommend titration of thyroid medication to assist . She reports excellent compliance with  treatment. -----------------------------------------------------------------------------------------     Medications: Outpatient Medications Prior to Visit  Medication Sig   albuterol (VENTOLIN HFA) 108 (90 Base) MCG/ACT inhaler USE 2 INHALATIONS BY MOUTH EVERY 6 HOURS AS NEEDED FOR WHEEZING  OR SHORTNESS OF BREATH   cetirizine (ZYRTEC) 10 MG tablet Take 10 mg by mouth daily as needed.   cholecalciferol (VITAMIN D) 400 units TABS tablet Take 400 Units by mouth daily.   conjugated estrogens (PREMARIN) vaginal cream Apply 0.13m (pea-sized amount)  just inside the vaginal introitus with a finger-tip on  Monday, Wednesday and Friday nights.   EPINEPHrine 0.3 mg/0.3 mL IJ SOAJ injection Inject 0.3 mLs into the skin once as needed for anaphylaxis.   estradiol (ESTRACE) 0.1 MG/GM vaginal cream Apply 0.5 g to the vaginal introitus on Monday, Wednesday and Friday nights   folic acid (FOLVITE) 1 MG tablet Take 1 mg by mouth daily.   gabapentin (NEURONTIN) 100 MG capsule Take 100 mg by mouth 3 (three) times daily. Per patient taking BID   hydrochlorothiazide (HYDRODIURIL) 25 MG tablet Take 25 mg by mouth daily.   hydrocortisone cream 1 % Apply 1 application topically as needed for itching.   ketoconazole (NIZORAL) 2 % cream Apply 1 application topically daily.   levothyroxine (SYNTHROID) 125 MCG tablet Take 1 tablet (125 mcg total) by mouth daily.   methotrexate (RHEUMATREX) 2.5 MG  tablet Take 20 mg by mouth every Thursday.    NYSTATIN powder APPLY TOPICALLY TO SKIN FOLDS THREE TIMES DAILY   Omega-3 Fatty Acids (FISH OIL) 1000 MG CAPS Take 1 capsule by mouth daily.   omeprazole (PRILOSEC) 20 MG capsule TAKE 1 CAPSULE BY MOUTH  TWICE DAILY BEFORE MEALS.   potassium chloride SA (K-DUR,KLOR-CON) 20 MEQ tablet Take 1 tablet by mouth  daily   propranolol (INDERAL) 40 MG tablet 80 mg in the am and 40 mg at night   Red Yeast Rice 600 MG CAPS Take 1 capsule by mouth daily.   rivaroxaban (XARELTO) 20 MG TABS  tablet Take 1 tablet (20 mg total) by mouth daily with supper. Please report any side effects to R leg following starting including worsening of bruise, new/changed edema, color variation or temperature change.   sertraline (ZOLOFT) 50 MG tablet TAKE 3 TABLETS BY MOUTH  DAILY   No facility-administered medications prior to visit.    Review of Systems     Objective    BP 100/66 (BP Location: Left Arm, Patient Position: Sitting, Cuff Size: Large)   Pulse 60   Temp 97.8 F (36.6 C) (Oral)   Resp 16   Wt 215 lb (97.5 kg)   BMI 34.70 kg/m  BP Readings from Last 3 Encounters:  02/23/22 100/66  11/21/21 (!) 122/41  11/03/21 (!) 102/50   Wt Readings from Last 3 Encounters:  02/23/22 215 lb (97.5 kg)  12/29/21 249 lb (112.9 kg)  11/21/21 249 lb 1.6 oz (113 kg)      Physical Exam Vitals reviewed.  Constitutional:      General: She is not in acute distress.    Appearance: Normal appearance. She is well-developed. She is not diaphoretic.  HENT:     Head: Normocephalic and atraumatic.  Eyes:     General: No scleral icterus.    Conjunctiva/sclera: Conjunctivae normal.  Neck:     Thyroid: No thyromegaly.  Cardiovascular:     Rate and Rhythm: Normal rate and regular rhythm.     Heart sounds: Normal heart sounds.  Pulmonary:     Effort: Pulmonary effort is normal. No respiratory distress.     Breath sounds: Normal breath sounds. No wheezing, rhonchi or rales.  Musculoskeletal:     Cervical back: Neck supple.     Right lower leg: No edema.     Left lower leg: No edema.  Lymphadenopathy:     Cervical: No cervical adenopathy.  Skin:    General: Skin is warm and dry.     Comments: Mild dry scalp behind R ear  Neurological:     Mental Status: She is alert and oriented to person, place, and time. Mental status is at baseline.  Psychiatric:        Mood and Affect: Mood normal.        Behavior: Behavior normal.      No results found for any visits on 02/23/22.  Assessment &  Plan     Problem List Items Addressed This Visit       Cardiovascular and Mediastinum   HTN (hypertension) - Primary    Well controlled Continue current medications Recheck metabolic panel F/u in 6 months       Relevant Orders   Comprehensive metabolic panel     Endocrine   Adult hypothyroidism    Last TSH slightly elevated and synthroid dose was increased to 125 mcg Continue Synthroid at current dose  Recheck TSH and adjust Synthroid as  indicated        Relevant Orders   TSH     Other   Prediabetes    Recommend low carb diet Recheck A1c       Relevant Orders   Hemoglobin A1c   Hypercholesteremia    Reviewed last lipid panel Not currently on a statin Recheck FLP and CMP Discussed diet and exercise       Relevant Orders   Comprehensive metabolic panel   Lipid panel   Hypokalemia    Recheck metabolic panel      Relevant Orders   Comprehensive metabolic panel   Acute blood loss anemia    From hematoma after fall Has been improving Recheck CBC      Relevant Orders   CBC   Obesity with body mass index (BMI) of 30.0 to 39.9    Discussed importance of healthy weight management Discussed diet and exercise         Return in about 6 months (around 08/25/2022) for chronic disease f/u.      I, Lavon Paganini, MD, have reviewed all documentation for this visit. The documentation on 02/23/22 for the exam, diagnosis, procedures, and orders are all accurate and complete.   Bacigalupo, Dionne Bucy, MD, MPH [facility]

## 2022-02-23 ENCOUNTER — Ambulatory Visit (INDEPENDENT_AMBULATORY_CARE_PROVIDER_SITE_OTHER): Payer: Medicare Other | Admitting: Family Medicine

## 2022-02-23 ENCOUNTER — Encounter: Payer: Self-pay | Admitting: Family Medicine

## 2022-02-23 VITALS — BP 100/66 | HR 60 | Temp 97.8°F | Resp 16 | Wt 215.0 lb

## 2022-02-23 DIAGNOSIS — E7800 Pure hypercholesterolemia, unspecified: Secondary | ICD-10-CM | POA: Diagnosis not present

## 2022-02-23 DIAGNOSIS — I10 Essential (primary) hypertension: Secondary | ICD-10-CM | POA: Diagnosis not present

## 2022-02-23 DIAGNOSIS — R7303 Prediabetes: Secondary | ICD-10-CM

## 2022-02-23 DIAGNOSIS — E039 Hypothyroidism, unspecified: Secondary | ICD-10-CM | POA: Diagnosis not present

## 2022-02-23 DIAGNOSIS — Z23 Encounter for immunization: Secondary | ICD-10-CM | POA: Diagnosis not present

## 2022-02-23 DIAGNOSIS — E669 Obesity, unspecified: Secondary | ICD-10-CM

## 2022-02-23 DIAGNOSIS — D62 Acute posthemorrhagic anemia: Secondary | ICD-10-CM

## 2022-02-23 DIAGNOSIS — E876 Hypokalemia: Secondary | ICD-10-CM | POA: Diagnosis not present

## 2022-02-23 NOTE — Assessment & Plan Note (Signed)
Recheck metabolic panel.   

## 2022-02-23 NOTE — Assessment & Plan Note (Signed)
Last TSH slightly elevated and synthroid dose was increased to 125 mcg Continue Synthroid at current dose  Recheck TSH and adjust Synthroid as indicated

## 2022-02-23 NOTE — Assessment & Plan Note (Signed)
From hematoma after fall Has been improving Recheck CBC

## 2022-02-23 NOTE — Assessment & Plan Note (Signed)
Recommend low carb diet °Recheck A1c  °

## 2022-02-23 NOTE — Assessment & Plan Note (Signed)
Discussed importance of healthy weight management Discussed diet and exercise  

## 2022-02-23 NOTE — Assessment & Plan Note (Signed)
Reviewed last lipid panel Not currently on a statin Recheck FLP and CMP Discussed diet and exercise  

## 2022-02-23 NOTE — Assessment & Plan Note (Signed)
Well controlled Continue current medications Recheck metabolic panel F/u in 6 months  

## 2022-02-24 ENCOUNTER — Telehealth: Payer: Self-pay

## 2022-02-24 NOTE — Telephone Encounter (Signed)
Patient advised of lab results

## 2022-02-24 NOTE — Telephone Encounter (Signed)
Copied from [place] [id number]. Topic: General - Other >> Feb 24, 2022 11:47 AM [name] wrote: Reason for CRM: The patient has returned missed call from S. Dimas regarding their lab results  Please contact further when possible

## 2022-03-01 LAB — COMPREHENSIVE METABOLIC PANEL
ALT: 11 IU/L (ref 0–32)
AST: 24 IU/L (ref 0–40)
Albumin/Globulin Ratio: 1 — ABNORMAL LOW (ref 1.2–2.2)
Albumin: 3.5 g/dL — ABNORMAL LOW (ref 3.8–4.8)
Alkaline Phosphatase: 91 IU/L (ref 44–121)
BUN/Creatinine Ratio: 17 (ref 12–28)
BUN: 13 mg/dL (ref 8–27)
Bilirubin Total: 0.6 mg/dL (ref 0.0–1.2)
CO2: 26 mmol/L (ref 20–29)
Calcium: 9.5 mg/dL (ref 8.7–10.3)
Chloride: 98 mmol/L (ref 96–106)
Creatinine, Ser: 0.76 mg/dL (ref 0.57–1.00)
Globulin, Total: 3.4 g/dL (ref 1.5–4.5)
Glucose: 81 mg/dL (ref 70–99)
Potassium: 4.2 mmol/L (ref 3.5–5.2)
Sodium: 141 mmol/L (ref 134–144)
Total Protein: 6.9 g/dL (ref 6.0–8.5)
eGFR: 79 mL/min/{1.73_m2} (ref >59)

## 2022-03-01 LAB — LIPID PANEL
Chol/HDL Ratio: 3.8 ratio (ref 0.0–4.4)
Cholesterol, Total: 142 mg/dL (ref 100–199)
HDL: 37 mg/dL — ABNORMAL LOW (ref >39)
LDL Chol Calc (NIH): 90 mg/dL (ref 0–99)
Triglycerides: 74 mg/dL (ref 0–149)
VLDL Cholesterol Cal: 15 mg/dL (ref 5–40)

## 2022-03-01 LAB — CBC
Hematocrit: 35.4 % (ref 34.0–46.6)
Hemoglobin: 11.2 g/dL (ref 11.1–15.9)
MCH: 26.3 pg — ABNORMAL LOW (ref 26.6–33.0)
MCHC: 31.6 g/dL (ref 31.5–35.7)
MCV: 83 fL (ref 79–97)
Platelets: 303 10*3/uL (ref 150–450)
RBC: 4.26 x10E6/uL (ref 3.77–5.28)
RDW: 14.9 % (ref 11.7–15.4)
WBC: 7.8 10*3/uL (ref 3.4–10.8)

## 2022-03-01 LAB — HEMOGLOBIN A1C
Est. average glucose Bld gHb Est-mCnc: 128 mg/dL
Hgb A1c MFr Bld: 6.1 % — ABNORMAL HIGH (ref 4.8–5.6)

## 2022-03-01 LAB — TSH: TSH: 2.73 u[IU]/mL (ref 0.450–4.500)

## 2022-03-07 DIAGNOSIS — M069 Rheumatoid arthritis, unspecified: Secondary | ICD-10-CM | POA: Diagnosis not present

## 2022-03-08 ENCOUNTER — Other Ambulatory Visit: Payer: Self-pay | Admitting: Family Medicine

## 2022-05-02 DIAGNOSIS — M0579 Rheumatoid arthritis with rheumatoid factor of multiple sites without organ or systems involvement: Secondary | ICD-10-CM | POA: Diagnosis not present

## 2022-05-02 DIAGNOSIS — M159 Polyosteoarthritis, unspecified: Secondary | ICD-10-CM | POA: Diagnosis not present

## 2022-05-02 DIAGNOSIS — L209 Atopic dermatitis, unspecified: Secondary | ICD-10-CM | POA: Diagnosis not present

## 2022-05-02 DIAGNOSIS — Z7960 Long term (current) use of unspecified immunomodulators and immunosuppressants: Secondary | ICD-10-CM | POA: Diagnosis not present

## 2022-05-04 DIAGNOSIS — M48062 Spinal stenosis, lumbar region with neurogenic claudication: Secondary | ICD-10-CM | POA: Diagnosis not present

## 2022-05-18 DIAGNOSIS — L570 Actinic keratosis: Secondary | ICD-10-CM | POA: Diagnosis not present

## 2022-05-18 DIAGNOSIS — L219 Seborrheic dermatitis, unspecified: Secondary | ICD-10-CM | POA: Diagnosis not present

## 2022-05-22 DIAGNOSIS — G8929 Other chronic pain: Secondary | ICD-10-CM | POA: Diagnosis not present

## 2022-05-22 DIAGNOSIS — M48062 Spinal stenosis, lumbar region with neurogenic claudication: Secondary | ICD-10-CM | POA: Diagnosis not present

## 2022-05-22 DIAGNOSIS — M5442 Lumbago with sciatica, left side: Secondary | ICD-10-CM | POA: Diagnosis not present

## 2022-06-15 ENCOUNTER — Other Ambulatory Visit: Payer: Self-pay

## 2022-06-15 DIAGNOSIS — I10 Essential (primary) hypertension: Secondary | ICD-10-CM

## 2022-06-15 DIAGNOSIS — E7800 Pure hypercholesterolemia, unspecified: Secondary | ICD-10-CM

## 2022-06-23 ENCOUNTER — Telehealth: Payer: Self-pay

## 2022-06-23 NOTE — Progress Notes (Signed)
Care Management & Coordination Services Pharmacy Team Pharmacy Assistant   Name: Glenda Williams  MRN: [id number] DOB: 01/27/1941  Reason for Encounter: Appointment Reminder  Contacted patient to confirm in office appointment with Glenda Williams, PharmD on 06/27/2022 at 0900. {US HC Outreach:[id number]}  Primary concerns for visit include: ***   Chart review: Conditions to be addressed/monitored: Atrial Fibrillation, CHF, HTN, Anxiety, Depression, GERD, Hypothyroidism, Allergic Rhinitis, Osteoarthritis, and Pseudoaneurysm, Valvular Heart Disease, Hypercholesteremia, Obseity  Recent office visits:  02/23/2022 Glenda Latch, MD (PCP Office Visit) for Hypertension- No medication changes noted, Lab orders placed,   12/29/2021 Glenda Bucker, LPN (PCP Clinical Support Visit) for Medicare Wellness Exam- No medication changes noted, No orders placed, Patient to follow-up in 6 months  Recent consult visits:  05/22/2022 Glenda Jungling, MD (Pain Management) for Follow-up- No medication changes noted, No orders placed, patient to follow-up prn.  05/18/2022 Glenda Bores, PA (Dermatology) for Rash- Start ketoconazole (NIZORAL) 2 % shampoo; Apply to scalp about 2-3x/week. Allow to soak for 5 minutes prior to rinsing out., Use the topical steroid (clobetasol 0.05% solution ) as needed when your scalp is itching and/or flaky/rashy - stop once your scalp is under control - use Q-tip to apply solution to the ears. - restart as needed, Patient to follow-up in 3 months  05/02/2022 Glenda Harries, MD (Rheumatology) I am unable to view this note  02/22/2022 Glenda Osier, MD (Cardiology) for 6 month follow-up- No medication changes noted, No orders placed, Patient to follow-up in 6 months  01/04/2022 Glenda Harries, MD (Rheumatology) for Hospital follow-up-No medication changes noted, No orders placed, Patient to follow-up in 4 months  Hospital visits:  None in previous 6  months  Medications: Outpatient Encounter Medications as of 06/23/2022  Medication Sig   albuterol (VENTOLIN HFA) 108 (90 Base) MCG/ACT inhaler USE 2 INHALATIONS BY MOUTH EVERY 6 HOURS AS NEEDED FOR WHEEZING  OR SHORTNESS OF BREATH   cetirizine (ZYRTEC) 10 MG tablet Take 10 mg by mouth daily as needed.   cholecalciferol (VITAMIN D) 400 units TABS tablet Take 400 Units by mouth daily.   conjugated estrogens (PREMARIN) vaginal cream Apply 0.5mg  (pea-sized amount)  just inside the vaginal introitus with a finger-tip on  Monday, Wednesday and Friday nights.   EPINEPHrine 0.3 mg/0.3 mL IJ SOAJ injection Inject 0.3 mLs into the skin once as needed for anaphylaxis.   estradiol (ESTRACE) 0.1 MG/GM vaginal cream Apply 0.5 g to the vaginal introitus on Monday, Wednesday and Friday nights   folic acid (FOLVITE) 1 MG tablet Take 1 mg by mouth daily.   gabapentin (NEURONTIN) 100 MG capsule Take 100 mg by mouth 3 (three) times daily. Per patient taking BID   hydrochlorothiazide (HYDRODIURIL) 25 MG tablet Take 25 mg by mouth daily.   hydrocortisone cream 1 % Apply 1 application topically as needed for itching.   ketoconazole (NIZORAL) 2 % cream Apply 1 application topically daily.   levothyroxine (SYNTHROID) 125 MCG tablet TAKE 1 TABLET BY MOUTH DAILY   methotrexate (RHEUMATREX) 2.5 MG tablet Take 20 mg by mouth every Thursday.    NYSTATIN powder APPLY TOPICALLY TO SKIN FOLDS THREE TIMES DAILY   Omega-3 Fatty Acids (FISH OIL) 1000 MG CAPS Take 1 capsule by mouth daily.   omeprazole (PRILOSEC) 20 MG capsule TAKE 1 CAPSULE BY MOUTH  TWICE DAILY BEFORE MEALS.   potassium chloride SA (K-DUR,KLOR-CON) 20 MEQ tablet Take 1 tablet by mouth  daily   propranolol (INDERAL) 40 MG tablet 80  mg in the am and 40 mg at night   Red Yeast Rice 600 MG CAPS Take 1 capsule by mouth daily.   rivaroxaban (XARELTO) 20 MG TABS tablet Take 1 tablet (20 mg total) by mouth daily with supper. Please report any side effects to R leg  following starting including worsening of bruise, new/changed edema, color variation or temperature change.   sertraline (ZOLOFT) 50 MG tablet TAKE 3 TABLETS BY MOUTH  DAILY   No facility-administered encounter medications on file as of 06/23/2022.   Do you have any problems getting your medications? {yes/no:20286}  If yes what types of problems are you experiencing? {Problems:27223}  What is your top health concern you would like to discuss at your upcoming visit?   Have you seen any other providers since your last visit with PCP? Yes  Star Rating Drugs:  None ID  Care Gaps: Annual wellness visit in last year? Yes  Glenda Williams, CPA/CMA Clinical Pharmacist Assistant Phone: [phone]

## 2022-06-27 DIAGNOSIS — M0579 Rheumatoid arthritis with rheumatoid factor of multiple sites without organ or systems involvement: Secondary | ICD-10-CM | POA: Diagnosis not present

## 2022-07-01 ENCOUNTER — Other Ambulatory Visit: Payer: Self-pay | Admitting: Family Medicine

## 2022-07-01 DIAGNOSIS — F419 Anxiety disorder, unspecified: Secondary | ICD-10-CM

## 2022-07-17 ENCOUNTER — Ambulatory Visit: Payer: Medicare Other

## 2022-07-17 DIAGNOSIS — F418 Other specified anxiety disorders: Secondary | ICD-10-CM

## 2022-07-17 DIAGNOSIS — I480 Paroxysmal atrial fibrillation: Secondary | ICD-10-CM

## 2022-07-17 DIAGNOSIS — I5032 Chronic diastolic (congestive) heart failure: Secondary | ICD-10-CM

## 2022-07-17 NOTE — Patient Instructions (Addendum)
Visit Information It was great speaking with you today!  Please let me know if you have any questions about our visit.  Plan:  Please reach out to [employer] to sign up for their coverage gap assistance program. You can reach them at [phone]. Let's try switching your sertraline to bedtime to see if that helps with your energy during the day.   Print copy of patient instructions, educational materials, and care plan provided in person.   [name], PharmD, Patsy Baltimore, CPP  Clinical Pharmacist Practitioner  [facility]   Adelene Idler, CPA/CMA Clinical Pharmacist Assistant  Phone: [phone]

## 2022-07-17 NOTE — Progress Notes (Signed)
Care Management & Coordination Services Pharmacy Note  07/17/2022 Name:  Glenda Williams MRN:  [id number] DOB:  1940/12/25  Summary: Patient presents for initial pharmacy consult.   -Patient reports low energy during the day.   -Patient reports financial barriers with Xarelto  Recommendations/Changes made from today's visit:  -Patient to sign up for Xarelto With Me coverage gap assistance.  -Switch sertraline to bedtime   Follow up plan: CPP follow-up 6 months  Subjective: Glenda Williams is an 82 y.o. year old female who is a primary patient of Bacigalupo, Marzella Schlein, MD.  The care coordination team was consulted for assistance with disease management and care coordination needs.    Engaged with patient face to face for initial visit.  Recent office visits: 02/23/2022 Shirlee Latch, MD (PCP Office Visit) for Hypertension- No medication changes noted, Lab orders placed,    12/29/2021 Kennedy Bucker, LPN (PCP Clinical Support Visit) for Medicare Wellness Exam- No medication changes noted, No orders placed, Patient to follow-up in 6 months  Recent consult visits: 05/22/2022 Filomena Jungling, MD (Pain Management) for Follow-up- No medication changes noted, No orders placed, patient to follow-up prn.   05/18/2022 Reva Bores, PA (Dermatology) for Rash- Start ketoconazole (NIZORAL) 2 % shampoo; Apply to scalp about 2-3x/week. Allow to soak for 5 minutes prior to rinsing out., Use the topical steroid (clobetasol 0.05% solution ) as needed when your scalp is itching and/or flaky/rashy - stop once your scalp is under control - use Q-tip to apply solution to the ears. - restart as needed, Patient to follow-up in 3 months   05/02/2022 Tracey Harries, MD (Rheumatology) I am unable to view this note   02/22/2022 Eduardo Osier, MD (Cardiology) for 6 month follow-up- No medication changes noted, No orders placed, Patient to follow-up in 6 months   01/04/2022 Tracey Harries, MD (Rheumatology) for Hospital follow-up-No medication changes noted, No orders placed, Patient to follow-up in 4 months  Hospital visits: None in previous 6 months   Objective:  Lab Results  Component Value Date   CREATININE 0.76 02/23/2022   BUN 13 02/23/2022   EGFR 79 02/23/2022   GFRNONAA >60 10/29/2021   GFRAA 77 11/18/2019   NA 141 02/23/2022   K 4.2 02/23/2022   CALCIUM 9.5 02/23/2022   CO2 26 02/23/2022   GLUCOSE 81 02/23/2022    Lab Results  Component Value Date/Time   HGBA1C 6.1 (H) 02/23/2022 11:06 AM   HGBA1C 5.9 (H) 08/11/2021 10:58 AM   MICROALBUR 20 10/16/2014 11:46 AM    Last diabetic Eye exam: No results found for: "HMDIABEYEEXA"  Last diabetic Foot exam: No results found for: "HMDIABFOOTEX"   Lab Results  Component Value Date   CHOL 142 02/23/2022   HDL 37 (L) 02/23/2022   LDLCALC 90 02/23/2022   TRIG 74 02/23/2022   CHOLHDL 3.8 02/23/2022       Latest Ref Rng & Units 02/23/2022   11:06 AM 11/03/2021    2:56 PM 08/11/2021   10:58 AM  Hepatic Function  Total Protein 6.0 - 8.5 g/dL 6.9  6.8  6.6   Albumin 3.8 - 4.8 g/dL 3.5  3.5  3.7   AST 0 - 40 IU/L 24  17  20    ALT 0 - 32 IU/L 11  12  12    Alk Phosphatase 44 - 121 IU/L 91  115  84   Total Bilirubin 0.0 - 1.2 mg/dL 0.6  0.7  0.5  Lab Results  Component Value Date/Time   TSH 2.730 02/23/2022 11:06 AM   TSH 4.760 (H) 11/03/2021 02:56 PM   FREET4 1.38 11/03/2021 02:56 PM   FREET4 1.12 08/11/2021 10:58 AM       Latest Ref Rng & Units 02/23/2022   11:06 AM 11/16/2021   10:48 AM 10/29/2021    3:49 AM  CBC  WBC 3.4 - 10.8 x10E3/uL 7.8  6.2  6.5   Hemoglobin 11.1 - 15.9 g/dL 40.9  9.7  7.9   Hematocrit 34.0 - 46.6 % 35.4  31.2  26.1   Platelets 150 - 450 x10E3/uL 303  298  282     Lab Results  Component Value Date/Time   VD25OH 43.7 05/07/2019 09:43 AM   VD25OH 33.4 03/27/2018 09:40 AM    Clinical ASCVD: No  The ASCVD Risk score (Arnett DK, et al., 2019) failed to  calculate for the following reasons:   The 2019 ASCVD risk score is only valid for ages 20 to 57       02/23/2022   10:33 AM 12/29/2021    9:56 AM 11/21/2021    2:57 PM  Depression screen PHQ 2/9  Decreased Interest 0 0 0  Down, Depressed, Hopeless 0 0 0  PHQ - 2 Score 0 0 0  Altered sleeping 0 0 0  Tired, decreased energy 3 0 1  Change in appetite 0 0 0  Feeling bad or failure about yourself  0 0 0  Trouble concentrating 0 0 1  Moving slowly or fidgety/restless 1 0 0  Suicidal thoughts 0 0 0  PHQ-9 Score 4 0 2  Difficult doing work/chores Not difficult at all Not difficult at all Not difficult at all     Social History   Tobacco Use  Smoking Status Never  Smokeless Tobacco Never   BP Readings from Last 3 Encounters:  02/23/22 100/66  11/21/21 (!) 122/41  11/03/21 (!) 102/50   Pulse Readings from Last 3 Encounters:  02/23/22 60  11/21/21 62  11/03/21 61   Wt Readings from Last 3 Encounters:  02/23/22 215 lb (97.5 kg)  12/29/21 249 lb (112.9 kg)  11/21/21 249 lb 1.6 oz (113 kg)   BMI Readings from Last 3 Encounters:  02/23/22 34.70 kg/m  12/29/21 40.19 kg/m  11/21/21 40.21 kg/m    Allergies  Allergen Reactions   Bactrim [Sulfamethoxazole-Trimethoprim] Anaphylaxis   Macrobid [Nitrofurantoin Macrocrystal] Rash   Mirabegron Rash   Oxybutynin Rash   Penicillins Rash    Has patient had a PCN reaction causing immediate rash, facial/tongue/throat swelling, SOB or lightheadedness with hypotension: Yes Has patient had a PCN reaction causing severe rash involving mucus membranes or skin necrosis: No Has patient had a PCN reaction that required hospitalization: No Has patient had a PCN reaction occurring within the last 10 years: Unknown If all of the above answers are "NO", then may proceed with Cephalosporin use.     Medications Reviewed Today     Reviewed by Erasmo Downer, MD (Physician) on 02/23/22 at 1120  Med List Status: <None>   Medication  Order Taking? Sig Documenting Provider Last Dose Status Informant  albuterol (VENTOLIN HFA) 108 (90 Base) MCG/ACT inhaler [id number] Yes USE 2 INHALATIONS BY MOUTH EVERY 6 HOURS AS NEEDED FOR WHEEZING  OR SHORTNESS OF BREATH Bacigalupo, Marzella Schlein, MD Taking Active   cetirizine (ZYRTEC) 10 MG tablet [id number] Yes Take 10 mg by mouth daily as needed. [provider] Taking Active Self, Child, Pharmacy  Records  cholecalciferol (VITAMIN D) 400 units TABS tablet [id number] Yes Take 400 Units by mouth daily. [provider] Taking Active Self, Child, Pharmacy Records  conjugated estrogens (PREMARIN) vaginal cream [id number] Yes Apply 0.5mg  (pea-sized amount)  just inside the vaginal introitus with a finger-tip on  Monday, Wednesday and Friday nights. Harle Battiest, PA-C Taking Active Self, Child, Pharmacy Records  EPINEPHrine 0.3 mg/0.3 mL IJ SOAJ injection [id number] Yes Inject 0.3 mLs into the skin once as needed for anaphylaxis. [provider] Taking Active Self, Child, Pharmacy Records  estradiol (ESTRACE) 0.1 MG/GM vaginal cream [id number] Yes Apply 0.5 g to the vaginal introitus on Monday, Wednesday and Friday nights McGowan, Wellington Hampshire, PA-C Taking Active Self, Child, Pharmacy Records  folic acid (FOLVITE) 1 MG tablet [id number] Yes Take 1 mg by mouth daily. [provider] Taking Active Self, Child, Pharmacy Records  gabapentin (NEURONTIN) 100 MG capsule [id number] Yes Take 100 mg by mouth 3 (three) times daily. Per patient taking BID [provider] Taking Active Self, Child, Pharmacy Records  hydrochlorothiazide (HYDRODIURIL) 25 MG tablet [id number] Yes Take 25 mg by mouth daily. [provider] Taking Active Self, Child, Pharmacy Records  hydrocortisone cream 1 % [id number] Yes Apply 1 application topically as needed for itching. [provider] Taking Active Self, Child, Pharmacy Records  ketoconazole (NIZORAL) 2 % cream [id number] Yes Apply 1  application topically daily. Erasmo Downer, MD Taking Active Self, Child, Pharmacy Records  levothyroxine (SYNTHROID) 125 MCG tablet [id number] Yes Take 1 tablet (125 mcg total) by mouth daily. Erasmo Downer, MD Taking Active   methotrexate [facility]) 2.5 MG tablet [id number] Yes Take 20 mg by mouth every Thursday.  [provider] Taking Active Self, Child, Pharmacy Records           Med Note Lubertha Sayres Nov 04, 2017 12:52 PM)    Morton Stall powder [id number] Yes APPLY TOPICALLY TO SKIN FOLDS THREE TIMES DAILY Jacky Kindle, FNP Taking Active Self, Child, Pharmacy Records  Omega-3 Fatty Acids (FISH OIL) 1000 MG CAPS [id number] Yes Take 1 capsule by mouth daily. [provider] Taking Active Self, Child, Pharmacy Records  omeprazole (PRILOSEC) 20 MG capsule [id number] Yes TAKE 1 CAPSULE BY MOUTH  TWICE DAILY BEFORE MEALS. Erasmo Downer, MD Taking Active Self, Child, Pharmacy Records  potassium chloride SA (K-DUR,KLOR-CON) 20 MEQ tablet [id number] Yes Take 1 tablet by mouth  daily Lorie Phenix, MD Taking Active Self, Child, Pharmacy Records  propranolol (INDERAL) 40 MG tablet [id number] Yes 80 mg in the am and 40 mg at night [provider] Taking Active Self, Child, Pharmacy Records  Red Yeast Rice 600 MG CAPS [id number] Yes Take 1 capsule by mouth daily. [provider] Taking Active Self, Child, Pharmacy Records  rivaroxaban (XARELTO) 20 MG TABS tablet [id number] Yes Take 1 tablet (20 mg total) by mouth daily with supper. Please report any side effects to R leg following starting including worsening of bruise, new/changed edema, color variation or temperature change. Jacky Kindle, FNP Taking Active   sertraline (ZOLOFT) 50 MG tablet [id number] Yes TAKE 3 TABLETS BY MOUTH  DAILY Bacigalupo, Marzella Schlein, MD Taking Active Self, Child, Pharmacy Records            SDOH:  (Social Determinants of Health) assessments and interventions  performed: Yes SDOH Interventions    Flowsheet Row Clinical Support from 12/29/2021 in [facility] Office Visit from 11/29/2020 in [facility]  Family Practice Office Visit from 10/07/2020 in [facility] Office Visit from 05/18/2020 in [facility] Clinical Support from 11/18/2019 in [facility]  SDOH Interventions       Food Insecurity Interventions Intervention Not Indicated -- -- -- --  Housing Interventions Intervention Not Indicated -- -- -- --  Transportation Interventions Intervention Not Indicated, Patient Resources (Friends/Family) -- -- -- --  Utilities Interventions Intervention Not Indicated -- -- -- --  Alcohol Usage Interventions Intervention Not Indicated (Score <7) -- -- -- --  Depression Interventions/Treatment  -- Currently on Treatment PHQ2-9 Score <4 Follow-up Not Indicated PHQ2-9 Score <4 Follow-up Not Indicated --  Financial Strain Interventions Intervention Not Indicated -- -- -- --  Physical Activity Interventions Intervention Not Indicated -- -- -- Other (Comments)  [Recommend to start walking 3 days a week for at least 30 minutes at a time.]  Stress Interventions Intervention Not Indicated -- -- -- --  Social Connections Interventions Intervention Not Indicated -- -- -- --       Medication Assistance:  Patient will sign up for Xarelto With Me for coverage gap assistance.  Medication Access: Within the past 30 days, how often has patient missed a dose of medication? None Is a pillbox or other method used to improve adherence? No  Factors that may affect medication adherence? financial need Are meds synced by current pharmacy? No  Are meds delivered by current pharmacy? Yes  Does patient experience delays in picking up medications due to transportation concerns? No   Compliance/Adherence/Medication fill history: Care Gaps: Annual wellness visit in last  year? Yes   Star-Rating Drugs: None ID   Assessment/Plan   Atrial Fibrillation (Goal: prevent stroke and major bleeding) -Controlled -CHADSVASC: 5 -Current treatment: Rate control: Propranolol 40 mg  Anticoagulation: Xarelto 20 mg  -Medications previously tried: NA -Patient to sign up for Xarelto With Me coverage gap assistance.  -Recommended to continue current medication  Heart Failure (Goal: manage symptoms and prevent exacerbations) -Controlled -Last ejection fraction: 55-60% (Date: 2019) -HF type: Diastolic -NYHA Class: II (slight limitation of activity) -AHA HF Stage: B (Heart disease present - no symptoms present) -Current treatment: Propranolol 40 mg  HCTZ 25 mg  -Medications previously tried: Metoprolol -Current home BP/HR readings: 119-124/70s -Recommended to continue current medication  Hyperlipidemia: (LDL goal < 100) -Controlled -Current treatment: Fish Oil  Red Yeast Rice  -Medications previously tried: NA  -Recommended to continue current medication  Depression/Anxiety (Goal: Maintain symptom remission) -Controlled -Current treatment: Sertraline 50 mg  -Medications previously tried/failed: NA -Patient reports low energy during the day.  Switch sertraline to bedtime  -Recommended to continue current medication  Follow Up Plan: Telephone follow up appointment with care management team member scheduled for:  01/22/2023 at 9:00 AM  [name], PharmD, Patsy [place], CPP  Clinical Pharmacist Practitioner  [facility] [phone]

## 2022-08-08 ENCOUNTER — Other Ambulatory Visit: Payer: Self-pay | Admitting: Family Medicine

## 2022-08-08 DIAGNOSIS — B372 Candidiasis of skin and nail: Secondary | ICD-10-CM

## 2022-08-22 DIAGNOSIS — M0579 Rheumatoid arthritis with rheumatoid factor of multiple sites without organ or systems involvement: Secondary | ICD-10-CM | POA: Diagnosis not present

## 2022-08-27 ENCOUNTER — Other Ambulatory Visit: Payer: Self-pay | Admitting: Family Medicine

## 2022-09-05 DIAGNOSIS — L219 Seborrheic dermatitis, unspecified: Secondary | ICD-10-CM | POA: Diagnosis not present

## 2022-09-05 DIAGNOSIS — L820 Inflamed seborrheic keratosis: Secondary | ICD-10-CM | POA: Diagnosis not present

## 2022-09-05 DIAGNOSIS — L720 Epidermal cyst: Secondary | ICD-10-CM | POA: Diagnosis not present

## 2022-09-14 DIAGNOSIS — I480 Paroxysmal atrial fibrillation: Secondary | ICD-10-CM | POA: Diagnosis not present

## 2022-09-14 DIAGNOSIS — I10 Essential (primary) hypertension: Secondary | ICD-10-CM | POA: Diagnosis not present

## 2022-09-14 DIAGNOSIS — M0579 Rheumatoid arthritis with rheumatoid factor of multiple sites without organ or systems involvement: Secondary | ICD-10-CM | POA: Diagnosis not present

## 2022-09-14 DIAGNOSIS — R600 Localized edema: Secondary | ICD-10-CM | POA: Diagnosis not present

## 2022-09-14 DIAGNOSIS — E669 Obesity, unspecified: Secondary | ICD-10-CM | POA: Diagnosis not present

## 2022-09-14 DIAGNOSIS — Z6833 Body mass index (BMI) 33.0-33.9, adult: Secondary | ICD-10-CM | POA: Diagnosis not present

## 2022-09-14 DIAGNOSIS — K219 Gastro-esophageal reflux disease without esophagitis: Secondary | ICD-10-CM | POA: Diagnosis not present

## 2022-09-17 ENCOUNTER — Other Ambulatory Visit: Payer: Self-pay | Admitting: Family Medicine

## 2022-09-18 NOTE — Telephone Encounter (Signed)
Requested Prescriptions  Pending Prescriptions Disp Refills   levothyroxine (SYNTHROID) 125 MCG tablet [Pharmacy Med Name: Levothyroxine Sodium 125 MCG Oral Tablet] 90 tablet 0    Sig: TAKE 1 TABLET BY MOUTH DAILY     Endocrinology:  Hypothyroid Agents Passed - 09/17/2022  4:19 AM      Passed - TSH in normal range and within 360 days    TSH  Date Value Ref Range Status  02/23/2022 2.730 0.450 - 4.500 uIU/mL Final         Passed - Valid encounter within last 12 months    Recent Outpatient Visits           6 months ago Primary hypertension   [place] [facility] [place], Marzella Schlein, MD   10 months ago Pseudoaneurysm [facility])   [facility] Jacky Kindle, FNP   10 months ago Hospital discharge follow-up   [facility] Jacky Kindle, FNP   1 year ago Adult hypothyroidism   [place] [facility] [place], Marzella Schlein, MD   1 year ago Poor mobility   [facility] Jacky Kindle, [place]

## 2022-10-03 DIAGNOSIS — M159 Polyosteoarthritis, unspecified: Secondary | ICD-10-CM | POA: Diagnosis not present

## 2022-10-03 DIAGNOSIS — Z7960 Long term (current) use of unspecified immunomodulators and immunosuppressants: Secondary | ICD-10-CM | POA: Diagnosis not present

## 2022-10-03 DIAGNOSIS — M0579 Rheumatoid arthritis with rheumatoid factor of multiple sites without organ or systems involvement: Secondary | ICD-10-CM | POA: Diagnosis not present

## 2022-10-17 DIAGNOSIS — M069 Rheumatoid arthritis, unspecified: Secondary | ICD-10-CM | POA: Diagnosis not present

## 2022-10-28 ENCOUNTER — Other Ambulatory Visit: Payer: Self-pay | Admitting: Family Medicine

## 2022-10-28 DIAGNOSIS — F419 Anxiety disorder, unspecified: Secondary | ICD-10-CM

## 2022-11-24 ENCOUNTER — Other Ambulatory Visit: Payer: Self-pay

## 2022-11-24 ENCOUNTER — Emergency Department
Admission: EM | Admit: 2022-11-24 | Discharge: 2022-11-24 | Disposition: A | Discharge status: Home / Self Care | Payer: Medicare Other | Attending: Emergency Medicine | Admitting: Emergency Medicine

## 2022-11-24 ENCOUNTER — Emergency Department: Payer: Medicare Other

## 2022-11-24 DIAGNOSIS — W01198A Fall on same level from slipping, tripping and stumbling with subsequent striking against other object, initial encounter: Secondary | ICD-10-CM | POA: Diagnosis not present

## 2022-11-24 DIAGNOSIS — M5030 Other cervical disc degeneration, unspecified cervical region: Secondary | ICD-10-CM | POA: Diagnosis not present

## 2022-11-24 DIAGNOSIS — M4312 Spondylolisthesis, cervical region: Secondary | ICD-10-CM | POA: Diagnosis not present

## 2022-11-24 DIAGNOSIS — Z7901 Long term (current) use of anticoagulants: Secondary | ICD-10-CM | POA: Diagnosis not present

## 2022-11-24 DIAGNOSIS — S0181XA Laceration without foreign body of other part of head, initial encounter: Secondary | ICD-10-CM | POA: Diagnosis not present

## 2022-11-24 DIAGNOSIS — S199XXA Unspecified injury of neck, initial encounter: Secondary | ICD-10-CM | POA: Diagnosis not present

## 2022-11-24 DIAGNOSIS — S0003XA Contusion of scalp, initial encounter: Secondary | ICD-10-CM | POA: Diagnosis not present

## 2022-11-24 DIAGNOSIS — Y92002 Bathroom of unspecified non-institutional (private) residence single-family (private) house as the place of occurrence of the external cause: Secondary | ICD-10-CM | POA: Insufficient documentation

## 2022-11-24 DIAGNOSIS — S0990XA Unspecified injury of head, initial encounter: Secondary | ICD-10-CM | POA: Diagnosis not present

## 2022-11-24 DIAGNOSIS — S0121XA Laceration without foreign body of nose, initial encounter: Secondary | ICD-10-CM | POA: Diagnosis not present

## 2022-11-24 DIAGNOSIS — M47812 Spondylosis without myelopathy or radiculopathy, cervical region: Secondary | ICD-10-CM | POA: Diagnosis not present

## 2022-11-24 DIAGNOSIS — S0083XA Contusion of other part of head, initial encounter: Secondary | ICD-10-CM

## 2022-11-24 DIAGNOSIS — G319 Degenerative disease of nervous system, unspecified: Secondary | ICD-10-CM | POA: Diagnosis not present

## 2022-11-24 DIAGNOSIS — R220 Localized swelling, mass and lump, head: Secondary | ICD-10-CM | POA: Diagnosis not present

## 2022-11-24 DIAGNOSIS — S022XXA Fracture of nasal bones, initial encounter for closed fracture: Secondary | ICD-10-CM | POA: Insufficient documentation

## 2022-11-24 DIAGNOSIS — S0992XA Unspecified injury of nose, initial encounter: Secondary | ICD-10-CM | POA: Diagnosis present

## 2022-11-24 MED ORDER — ACETAMINOPHEN 500 MG PO TABS
1000.0000 mg | ORAL_TABLET | Freq: Once | ORAL | Status: AC
  Administered 2022-11-24: 1000 mg via ORAL
  Filled 2022-11-24: qty 2

## 2022-11-24 NOTE — Discharge Instructions (Addendum)
You were seen in the emergency department following a fall.  You had no signs of internal bleeding in your brain.  You did have a fracture to a bone in your nose.  You are given information to follow-up with ear nose and throat specialist.  Apply ice for swelling.  No nose blowing.  Follow-up closely with your primary care physician.  Return to the emergency department for any worsening symptoms.

## 2022-11-24 NOTE — ED Provider Notes (Addendum)
[facility] Provider Note    None    (approximate)   History   Fall   HPI  Glenda Williams is a 82 y.o. female past medical history significant for atrial fibrillation on anticoagulation, who presents to the emergency department following a fall.  Stood up from using the bathroom and then fell forward hitting her head.  Denies any dizziness or chest pain.  Denies passing out.  Denies any neck pain.  No change in vision.  No trouble swallowing.  No pain to her chest, abdomen or hips.  Ambulatory at home and in the emergency department.     Physical Exam   Triage Vital Signs: ED Triage Vitals  Encounter Vitals Group     BP 11/24/22 0715 (!) 159/71     Systolic BP Percentile --      Diastolic BP Percentile --      Pulse Rate 11/24/22 0715 60     Resp 11/24/22 0715 18     Temp 11/24/22 0715 98.3 F (36.8 C)     Temp src --      SpO2 11/24/22 0715 96 %     Weight --      Height --      Head Circumference --      Peak Flow --      Pain Score 11/24/22 0713 7     Pain Loc --      Pain Education --      Exclude from Growth Chart --     Most recent vital signs: Vitals:   11/24/22 0715  BP: (!) 159/71  Pulse: 60  Resp: 18  Temp: 98.3 F (36.8 C)  SpO2: 96%    Physical Exam HENT:     Head:     Comments: Large forehead hematoma with abrasion.  Abrasion to the right face.  Dried blood to the nare with no septal hematoma.    Right Ear: External ear normal.     Left Ear: External ear normal.  Eyes:     Extraocular Movements: Extraocular movements intact.     Pupils: Pupils are equal, round, and reactive to light.  Cardiovascular:     Rate and Rhythm: Normal rate.  Pulmonary:     Effort: No respiratory distress.  Abdominal:     General: There is no distension.  Musculoskeletal:        General: No tenderness. Normal range of motion.     Cervical back: No tenderness.  Skin:    Capillary Refill: Capillary refill takes less than 2 seconds.   Neurological:     General: No focal deficit present.     Mental Status: She is alert.  Psychiatric:        Mood and Affect: Mood normal.      IMPRESSION / MDM / ASSESSMENT AND PLAN / ED COURSE  I reviewed the triage vital signs and the nursing notes.  Differential diagnosis including intracranial hemorrhage, concussion, skull fracture, forehead hematoma, cervical spine fracture, facial fracture.  No concern for basilar skull fracture.  No signs of a septal hematoma.  Tetanus up-to-date.  On chart review fall 1 year ago with a large right thigh hematoma   RADIOLOGY I independently reviewed imaging, my interpretation of imaging: CT scan of the head, cervical spine and CT scan of the face.  No signs of intracranial hemorrhage.  No signs of a skull fracture or cervical spine fracture.   Labs (all labs ordered are listed, but  only abnormal results are displayed) Labs interpreted as -    Labs Reviewed - No data to display  Abrasions repaired at bedside.  CT scans read as minimally displaced nasal bone fracture.  No signs of a septal hematoma.  Discussed symptomatic treatment and given information to follow-up with ear nose and throat specialist.  Discussed close follow-up with primary care provider.  Discharged home in stable condition with return precautions and follow-up with primary care physician.     PROCEDURES:  Critical Care performed: No  ..Laceration Repair  Date/Time: 11/24/2022 8:00 AM  Performed by: Corena Herter, MD Authorized by: Corena Herter, MD   Consent:    Consent obtained:  Verbal   Consent given by:  Patient   Risks, benefits, and alternatives were discussed: yes     Risks discussed:  Pain, nerve damage, poor wound healing, poor cosmetic result, vascular damage, tendon damage, infection and need for additional repair   Alternatives discussed:  Delayed treatment and no treatment Universal protocol:    Procedure explained and questions answered to  patient or proxy's satisfaction: yes     Relevant documents present and verified: yes     Test results available: yes     Imaging studies available: yes     Required blood products, implants, devices, and special equipment available: yes     Patient identity confirmed:  Verbally with patient Anesthesia:    Anesthesia method:  None Laceration details:    Location:  Face   Face location:  Forehead   Length (cm):  1 Pre-procedure details:    Preparation:  Patient was prepped and draped in usual sterile fashion Treatment:    Amount of cleaning:  Standard Skin repair:    Repair method:  Tissue adhesive Approximation:    Approximation:  Close Repair type:    Repair type:  Simple Post-procedure details:    Dressing:  Sterile dressing   Procedure completion:  Tolerated well, no immediate complications   Patient's presentation is most consistent with acute presentation with potential threat to life or bodily function.   MEDICATIONS ORDERED IN ED: Medications  acetaminophen (TYLENOL) tablet 1,000 mg (1,000 mg Oral Given 11/24/22 0815)    FINAL CLINICAL IMPRESSION(S) / ED DIAGNOSES   Final diagnoses:  Injury of head, initial encounter  Forehead contusion, initial encounter  Closed fracture of nasal bone, initial encounter     Rx / DC Orders   ED Discharge Orders     None        Note:  This document was prepared using Dragon voice recognition software and may include unintentional dictation errors.   Corena Herter, MD 11/24/22 1610    Corena Herter, MD 11/24/22 [phone]

## 2022-11-24 NOTE — ED Triage Notes (Addendum)
Pt comes with c/o fall. Pt has large hematoma on forehead with bruising. Pt states she fell in bathroom. Pt states she got up off the potty and turned then fell face forward on concrete. Pt states no loc. Pt is on xarelto.  Pt states head pain and some nose pain.

## 2022-11-28 DIAGNOSIS — J3489 Other specified disorders of nose and nasal sinuses: Secondary | ICD-10-CM | POA: Diagnosis not present

## 2022-11-28 DIAGNOSIS — S022XXA Fracture of nasal bones, initial encounter for closed fracture: Secondary | ICD-10-CM | POA: Diagnosis not present

## 2022-12-04 ENCOUNTER — Other Ambulatory Visit: Payer: Self-pay | Admitting: Family Medicine

## 2022-12-12 DIAGNOSIS — M0579 Rheumatoid arthritis with rheumatoid factor of multiple sites without organ or systems involvement: Secondary | ICD-10-CM | POA: Diagnosis not present

## 2022-12-20 ENCOUNTER — Ambulatory Visit: Payer: Self-pay

## 2022-12-20 NOTE — Telephone Encounter (Signed)
Summary: Pt reports that she had a fall on Monday and would like to speak with a nurse.   Pt reports that she had a fall on Monday and would like to speak with a nurse. Pt stated that she has a scrape on her arm and experiencing left shoulder pain. Cb# [phone] or [phone].         Chief Complaint: fall Symptoms: fall on L side, scrape to L arm, L shoulder and rib/back pain, breathing hurts at times Frequency: Monday  Pertinent Negatives: NA Disposition: [] ED /[] Urgent Care (no appt availability in office) / [x] Appointment(In office/virtual)/ []  [place] [facility]/ [] Home Care/ [] Refused Recommended Disposition /[] [place] [facility]/ []  Follow-up with PCP Additional Notes: pt states she had fall on 12/18/22, fell down side of car. Has L shoulder pain and hurts when breathing at times. Pt states that she is on heart meds. Denies hitting head. Advised of appt with Dr. Leonard Schwartz tomorrow at 0900 or could schedule UC appt today, pt preferred to see PCP, scheduled OV at 0900 and advised if sx get any worse to call back.   Reason for Disposition  MILD weakness (i.e., does not interfere with ability to work, go to school, normal activities)  (Exception: Mild weakness is a chronic symptom.)  Answer Assessment - Initial Assessment Questions 1. MECHANISM: "How did the fall happen?"     Fell down the side of car, was walking and just fell  3. ONSET: "When did the fall happen?" (e.g., minutes, hours, or days ago)     Monday  4. LOCATION: "What part of the body hit the ground?" (e.g., back, buttocks, head, hips, knees, hands, head, stomach)     Fell on L side of body  5. INJURY: "Did you hurt (injure) yourself when you fell?" If Yes, ask: "What did you injure? Tell me more about this?" (e.g., body area; type of injury; pain severity)"     L shoulder pain  6. PAIN: "Is there any pain?" If Yes, ask: "How bad is the pain?" (e.g., Scale 1-10; or mild,  moderate, severe)   - NONE (0): No pain   -  MILD (1-3): Doesn't interfere with normal activities    - MODERATE (4-7): Interferes with normal activities or awakens from sleep    - SEVERE (8-10): Excruciating pain, unable to do any normal activities      8/10 7. SIZE: For cuts, bruises, or swelling, ask: "How large is it?" (e.g., inches or centimeters)      Scrape on L arm  9. OTHER SYMPTOMS: "Do you have any other symptoms?" (e.g., dizziness, fever, weakness; new onset or worsening).      Hurts when breathing at times  Protocols used: Falls and [facility]

## 2022-12-21 ENCOUNTER — Ambulatory Visit: Payer: Medicare Other | Admitting: Family Medicine

## 2022-12-21 ENCOUNTER — Ambulatory Visit: Payer: PRIVATE HEALTH INSURANCE | Admitting: Family Medicine

## 2022-12-21 ENCOUNTER — Encounter: Payer: Self-pay | Admitting: Family Medicine

## 2022-12-21 VITALS — BP 120/72 | HR 68 | Resp 16 | Ht 65.0 in | Wt 212.0 lb

## 2022-12-21 DIAGNOSIS — R051 Acute cough: Secondary | ICD-10-CM | POA: Diagnosis not present

## 2022-12-21 DIAGNOSIS — R55 Syncope and collapse: Secondary | ICD-10-CM

## 2022-12-21 DIAGNOSIS — S20212A Contusion of left front wall of thorax, initial encounter: Secondary | ICD-10-CM | POA: Diagnosis not present

## 2022-12-21 DIAGNOSIS — W19XXXA Unspecified fall, initial encounter: Secondary | ICD-10-CM

## 2022-12-21 DIAGNOSIS — Y92009 Unspecified place in unspecified non-institutional (private) residence as the place of occurrence of the external cause: Secondary | ICD-10-CM

## 2022-12-21 NOTE — Progress Notes (Signed)
Patient ID: Glenda Williams, female    DOB: 1940-05-28, 82 y.o.   MRN: [id number]  PCP: Erasmo Downer, MD  Chief Complaint  Patient presents with   recent fall    12/18/22. L rib pain   Cough    X2 days    Subjective:   Glenda Williams is a 82 y.o. female, presents to clinic with CC of the following:  HPI  Pt stumbled and fell 3 d ago getting out of the car and landed on her left side She had pain with breathing and rib soreness that has gotten much better over the last 2 days. She is not having SOB She had a mild cough prior to the fall    Falls/syncope? Near syncope - history of getting dizzy/"swimmy headed" she's felt like this for 2-3 years, usually is careful when getting up and she holds onto car/cane or whoever she is with prior to walking   Pulse Readings from Last 3 Encounters:  12/21/22 61  11/24/22 60  02/23/22 60      Patient Active Problem List   Diagnosis Date Noted   Pseudoaneurysm (HCC) 11/03/2021   Acute blood loss anemia 10/23/2021   Depression with anxiety 10/23/2021   Obesity with body mass index (BMI) of 30.0 to 39.9 10/23/2021   Compression fracture of body of thoracic vertebra_T8, chronic 10/23/2021   HTN (hypertension) 10/23/2021   Chronic diastolic CHF (congestive heart failure) (HCC) 10/23/2021   Poor mobility 08/11/2021   Use of cane as ambulatory aid 08/11/2021   Bilateral leg edema 08/11/2021   Neuropathy 08/11/2021   Bilateral flank pain 11/11/2020   Valvular heart disease 08/13/2019   Current use of long term anticoagulation 05/07/2019   Hypertension 04/26/2015   Dry mouth 04/26/2015   Hypokalemia 08/12/2014   Allergic rhinitis 08/04/2014   Anxiety 08/04/2014   Neuropathic pain 08/04/2014   Atrial fibrillation (HCC) 08/04/2014   Avitaminosis D 08/04/2014   Gastroesophageal reflux disease without esophagitis 11/07/2013   Polypharmacy 08/19/2013   Osteoarthritis 06/11/2013   Rheumatoid arthritis (HCC) 06/11/2013    Hypercholesteremia 05/12/2009   Benign essential tremor 05/12/2009   Prediabetes 08/26/2008   Adult hypothyroidism 08/26/2008      Current Outpatient Medications:    cetirizine (ZYRTEC) 10 MG tablet, Take 10 mg by mouth daily as needed., Disp: , Rfl:    cholecalciferol (VITAMIN D) 400 units TABS tablet, Take 400 Units by mouth daily., Disp: , Rfl:    conjugated estrogens (PREMARIN) vaginal cream, Apply 0.5mg  (pea-sized amount)  just inside the vaginal introitus with a finger-tip on  Monday, Wednesday and Friday nights., Disp: 30 g, Rfl: 12   EPINEPHrine 0.3 mg/0.3 mL IJ SOAJ injection, Inject 0.3 mLs into the skin once as needed for anaphylaxis., Disp: , Rfl: 1   folic acid (FOLVITE) 1 MG tablet, Take 1 mg by mouth daily., Disp: , Rfl:    gabapentin (NEURONTIN) 100 MG capsule, Take 100 mg by mouth 3 (three) times daily. Per patient taking BID, Disp: , Rfl:    hydrochlorothiazide (HYDRODIURIL) 25 MG tablet, Take 25 mg by mouth daily., Disp: , Rfl:    hydrocortisone cream 1 %, Apply 1 application topically as needed for itching., Disp: , Rfl:    ketoconazole (NIZORAL) 2 % cream, Apply 1 application topically daily., Disp: 30 g, Rfl: 2   levothyroxine (SYNTHROID) 125 MCG tablet, TAKE 1 TABLET BY MOUTH DAILY, Disp: 90 tablet, Rfl: 1   NYSTATIN powder, APPLY TOPICALLY TO SKIN  FOLDS THREE TIMES DAILY, Disp: 60 g, Rfl: 1   Omega-3 Fatty Acids (FISH OIL) 1000 MG CAPS, Take 1 capsule by mouth daily., Disp: , Rfl:    omeprazole (PRILOSEC) 20 MG capsule, TAKE 1 CAPSULE BY MOUTH TWICE  DAILY BEFORE MEALS, Disp: 180 capsule, Rfl: 3   potassium chloride SA (K-DUR,KLOR-CON) 20 MEQ tablet, Take 1 tablet by mouth  daily, Disp: 90 tablet, Rfl: 1   propranolol (INDERAL) 40 MG tablet, 80 mg in the am and 40 mg at night, Disp: , Rfl:    Red Yeast Rice 600 MG CAPS, Take 1 capsule by mouth daily., Disp: , Rfl:    rivaroxaban (XARELTO) 20 MG TABS tablet, Take 1 tablet (20 mg total) by mouth daily with supper.  Please report any side effects to R leg following starting including worsening of bruise, new/changed edema, color variation or temperature change., Disp: 30 tablet, Rfl: 5   sertraline (ZOLOFT) 50 MG tablet, TAKE 3 TABLETS BY MOUTH DAILY, Disp: 270 tablet, Rfl: 3   albuterol (VENTOLIN HFA) 108 (90 Base) MCG/ACT inhaler, USE 2 INHALATIONS BY MOUTH EVERY 6 HOURS AS NEEDED FOR WHEEZING  OR SHORTNESS OF BREATH (Patient not taking: Reported on 07/17/2022), Disp: 26.8 g, Rfl: 3   methotrexate (RHEUMATREX) 2.5 MG tablet, Take 20 mg by mouth every Thursday.  (Patient not taking: Reported on 07/17/2022), Disp: , Rfl:    Allergies  Allergen Reactions   Bactrim [Sulfamethoxazole-Trimethoprim] Anaphylaxis   Macrobid [Nitrofurantoin Macrocrystal] Rash   Mirabegron Rash   Oxybutynin Rash   Penicillins Rash    Has patient had a PCN reaction causing immediate rash, facial/tongue/throat swelling, SOB or lightheadedness with hypotension: Yes Has patient had a PCN reaction causing severe rash involving mucus membranes or skin necrosis: No Has patient had a PCN reaction that required hospitalization: No Has patient had a PCN reaction occurring within the last 10 years: Unknown If all of the above answers are "NO", then may proceed with Cephalosporin use.      Social History   Tobacco Use   Smoking status: Never   Smokeless tobacco: Never  Vaping Use   Vaping status: Never Used  Substance Use Topics   Alcohol use: No   Drug use: No      Chart Review Today: I personally reviewed active problem list, medication list, allergies, family history, social history, health maintenance, notes from last encounter, lab results, imaging with the patient/caregiver today.   Review of Systems  Constitutional: Negative.   HENT: Negative.    Eyes: Negative.   Respiratory: Negative.    Cardiovascular: Negative.   Gastrointestinal: Negative.   Endocrine: Negative.   Genitourinary: Negative.   Musculoskeletal:  Negative.   Skin: Negative.   Allergic/Immunologic: Negative.   Neurological: Negative.   Hematological: Negative.   Psychiatric/Behavioral: Negative.    All other systems reviewed and are negative.      Objective:   Vitals:   12/21/22 0936  BP: 120/74  Pulse: 61  Resp: 16  SpO2: 99%  Weight: 212 lb (96.2 kg)  Height: 5\' 5"  (1.651 m)    Body mass index is 35.28 kg/m.  Physical Exam Vitals and nursing note reviewed. Exam conducted with a chaperone present.  Constitutional:      General: She is not in acute distress.    Appearance: She is obese. She is not ill-appearing, toxic-appearing or diaphoretic.  HENT:     Right Ear: External ear normal.     Left Ear: External ear normal.  Nose: Nose normal.  Eyes:     General: No scleral icterus.       Right eye: No discharge.        Left eye: No discharge.     Conjunctiva/sclera: Conjunctivae normal.  Cardiovascular:     Rate and Rhythm: Normal rate. Rhythm irregularly irregular.     Pulses: Normal pulses.          Radial pulses are 2+ on the right side and 2+ on the left side.     Heart sounds: No murmur heard.    No friction rub. No gallop.  Pulmonary:     Effort: Pulmonary effort is normal. No tachypnea, accessory muscle usage, prolonged expiration, respiratory distress or retractions.     Breath sounds: Normal breath sounds. No transmitted upper airway sounds. No decreased breath sounds, wheezing, rhonchi or rales.     Comments: Good inspiratory effort, lungs CTA A&P Chest:     Chest wall: Tenderness (very mild to left lateral chest wall) present. No mass, deformity, swelling, crepitus or edema.     Comments: No bruising to left chest wall, no flail chest Abdominal:     General: Bowel sounds are normal.     Palpations: Abdomen is soft.     Tenderness: There is no abdominal tenderness.     Comments: Left lower side with small quarter-sized bruise  Skin:    Findings: Bruising present.  Neurological:     Mental  Status: She is alert.     Comments: Pt in WC throughout exam, gait not evaluated  Psychiatric:        Mood and Affect: Mood normal.      Results for orders placed or performed in visit on 02/23/22  TSH  Result Value Ref Range   TSH 2.730 0.450 - 4.500 uIU/mL  Comprehensive metabolic panel  Result Value Ref Range   Glucose 81 70 - 99 mg/dL   BUN 13 8 - 27 mg/dL   Creatinine, Ser 1.61 0.57 - 1.00 mg/dL   eGFR 79 >09 UE/AVW/0.98   BUN/Creatinine Ratio 17 12 - 28   Sodium 141 134 - 144 mmol/L   Potassium 4.2 3.5 - 5.2 mmol/L   Chloride 98 96 - 106 mmol/L   CO2 26 20 - 29 mmol/L   Calcium 9.5 8.7 - 10.3 mg/dL   Total Protein 6.9 6.0 - 8.5 g/dL   Albumin 3.5 (L) 3.8 - 4.8 g/dL   Globulin, Total 3.4 1.5 - 4.5 g/dL   Albumin/Globulin Ratio 1.0 (L) 1.2 - 2.2   Bilirubin Total 0.6 0.0 - 1.2 mg/dL   Alkaline Phosphatase 91 44 - 121 IU/L   AST 24 0 - 40 IU/L   ALT 11 0 - 32 IU/L  CBC  Result Value Ref Range   WBC 7.8 3.4 - 10.8 x10E3/uL   RBC 4.26 3.77 - 5.28 x10E6/uL   Hemoglobin 11.2 11.1 - 15.9 g/dL   Hematocrit 11.9 14.7 - 46.6 %   MCV 83 79 - 97 fL   MCH 26.3 (L) 26.6 - 33.0 pg   MCHC 31.6 31.5 - 35.7 g/dL   RDW 82.9 56.2 - 13.0 %   Platelets 303 150 - 450 x10E3/uL  Lipid panel  Result Value Ref Range   Cholesterol, Total 142 100 - 199 mg/dL   Triglycerides 74 0 - 149 mg/dL   HDL 37 (L) >86 mg/dL   VLDL Cholesterol Cal 15 5 - 40 mg/dL   LDL Chol Calc (NIH) 90 0 -  99 mg/dL   Chol/HDL Ratio 3.8 0.0 - 4.4 ratio  Hemoglobin A1c  Result Value Ref Range   Hgb A1c MFr Bld 6.1 (H) 4.8 - 5.6 %   Est. average glucose Bld gHb Est-mCnc 128 mg/dL       Assessment & Plan:   1. Contusion of left chest wall, initial encounter Fall 3 d ago with chest wall pain that was sore the first day but significantly improved since, no pain with deep breathing, no SOB Chest wall and pulm exam very reassuring Pt will do CXR if any sx worsen, she does not feel like she needs it today and I  agree - likely was a minor chest wall contusion, clear BS, no flail chest/deformity/bruising Encouraged deep breathing once an hour  - DG Chest 2 View  2. Acute cough She notes minor tickle and post nasal drip before she fell No coughing at all during exam Lungs CTA A&P No concern for pneumonia - DG Chest 2 View  3. Fall in home, initial encounter Recurrent falls over the past month Encouraged f/up with PCP and cardiology  4. Near syncope "Episodes" of dizziness/near syncope for 2-3 years pt reports but husband states much worse in the last year, two falls/injury in the last month Orthostatics sitting/standing here reassuring, pt did not feel like she could get up on exam table and lay down No drop in BP and HR in 60-70's, irregularly irregular - she has not taken propanolol dose today - I recommended f/up with cardiology for recheck of BB - all her episodes of dizziness/"swimmy headedness" and falls occurred after getting up after sleeping, she is being careful to wait and hold onto things before ambulating, but had two falls in 1 month - may be worth checking with cardiology about adjusting BB?       Danelle Berry, PA-C 12/21/22 9:53 AM

## 2022-12-21 NOTE — Patient Instructions (Addendum)
I am not concerned about a rib fracture since your chest wall and your breath sounds (lungs) look so good and you are not having pain.  Take and intentional deep breath once an hour to prevent any possible pneumonia from developing.  Your quickly improving pain is very reassuring and I think you had a very minor chest wall contusion with your fall.  I recommend you get a follow up with cardiology as soon as they can get you in - to evaluate you for your dizziness/near syncope episodes and falls.

## 2022-12-25 ENCOUNTER — Telehealth: Payer: Self-pay | Admitting: Family Medicine

## 2022-12-25 ENCOUNTER — Other Ambulatory Visit: Payer: Self-pay

## 2022-12-25 MED ORDER — ALBUTEROL SULFATE HFA 108 (90 BASE) MCG/ACT IN AERS: 1.0000 | INHALATION_SPRAY | RESPIRATORY_TRACT | 3 refills | Status: DC | PRN

## 2022-12-25 NOTE — Telephone Encounter (Signed)
[facility] faxed refill request for the following medications:  albuterol (VENTOLIN HFA) 108 (90 Base) MCG/ACT inhaler    Please advise.

## 2022-12-25 NOTE — Telephone Encounter (Signed)
Sent medication to the pharmacy  

## 2022-12-28 ENCOUNTER — Telehealth: Payer: Self-pay

## 2022-12-28 NOTE — Telephone Encounter (Signed)
Transition Care Management Unsuccessful Follow-up Telephone Call  Date of discharge and from where:  11/24/2022 [facility]  Attempts:  1st Attempt  Reason for unsuccessful TCM follow-up call:  No answer/busy  Tranesha Lessner [facility]  [facility], [facility] Guide Direct Dial: [phone]  Website: [email or web address]

## 2022-12-28 NOTE — Telephone Encounter (Signed)
Transition Care Management Unsuccessful Follow-up Telephone Call  Date of discharge and from where:  11/24/2022 [facility]  Attempts:  2nd Attempt  Reason for unsuccessful TCM follow-up call:  No answer/busy voicemail does not pick-up.  Clytee Heinrich [facility]  [facility], [facility] Guide Direct Dial: [phone]  Website: [email or web address]

## 2023-01-23 ENCOUNTER — Ambulatory Visit: Payer: Medicare Other

## 2023-01-23 DIAGNOSIS — Z0000 Encounter for general adult medical examination without abnormal findings: Secondary | ICD-10-CM

## 2023-01-23 NOTE — Progress Notes (Signed)
Subjective:   Glenda Williams is a 82 y.o. female who presents for Medicare Annual (Subsequent) preventive examination.  Visit Complete: Virtual I connected with  Glenda Williams on 01/23/23 by a audio enabled telemedicine application and verified that I am speaking with the correct person using two identifiers.  Patient Location: Home  Provider Location: Office/Clinic  I discussed the limitations of evaluation and management by telemedicine. The patient expressed understanding and agreed to proceed.  Vital Signs: Because this visit was a virtual/telehealth visit, some criteria may be missing or patient reported. Any vitals not documented were not able to be obtained and vitals that have been documented are patient reported.  Cardiac Risk Factors include: advanced age (>67men, >76 women);hypertension;sedentary lifestyle;obesity (BMI >30kg/m2)     Objective:    Today's Vitals   01/23/23 1456  PainSc: 4    There is no height or weight on file to calculate BMI.     01/23/2023    3:08 PM 11/24/2022    7:15 AM 12/29/2021    9:59 AM 10/23/2021    7:37 PM 10/23/2021    5:27 AM 11/18/2019    9:02 AM 10/18/2018    2:34 PM  Advanced Directives  Does Patient Have a Medical Advance Directive? No No No Yes Yes Yes Yes  Type of Advance Directive    Living will;Healthcare Power of Attorney Living will Healthcare Power of [place];Living will Living will;Healthcare Power of Attorney  Does patient want to make changes to medical advance directive?    No - Patient declined   No - Patient declined  Copy of Healthcare Power of Attorney in Chart?    No - copy requested  No - copy requested No - copy requested  Would patient like information on creating a medical advance directive? No - Patient declined  No - Patient declined        Current Medications (verified) Outpatient Encounter Medications as of 01/23/2023  Medication Sig   albuterol (VENTOLIN HFA) 108 (90 Base) MCG/ACT inhaler Inhale 1-2  puffs into the lungs every 4 (four) hours as needed for wheezing or shortness of breath.   cetirizine (ZYRTEC) 10 MG tablet Take 10 mg by mouth daily as needed.   cholecalciferol (VITAMIN D) 400 units TABS tablet Take 400 Units by mouth daily.   conjugated estrogens (PREMARIN) vaginal cream Apply 0.5mg  (pea-sized amount)  just inside the vaginal introitus with a finger-tip on  Monday, Wednesday and Friday nights.   EPINEPHrine 0.3 mg/0.3 mL IJ SOAJ injection Inject 0.3 mLs into the skin once as needed for anaphylaxis.   folic acid (FOLVITE) 1 MG tablet Take 1 mg by mouth daily.   gabapentin (NEURONTIN) 100 MG capsule Take 100 mg by mouth 3 (three) times daily. Per patient taking BID   hydrochlorothiazide (HYDRODIURIL) 25 MG tablet Take 25 mg by mouth daily.   ketoconazole (NIZORAL) 2 % cream Apply 1 application topically daily.   levothyroxine (SYNTHROID) 125 MCG tablet TAKE 1 TABLET BY MOUTH DAILY   methotrexate (RHEUMATREX) 2.5 MG tablet Take 20 mg by mouth every Thursday.   NYSTATIN powder APPLY TOPICALLY TO SKIN FOLDS THREE TIMES DAILY   Omega-3 Fatty Acids (FISH OIL) 1000 MG CAPS Take 1 capsule by mouth daily.   omeprazole (PRILOSEC) 20 MG capsule TAKE 1 CAPSULE BY MOUTH TWICE  DAILY BEFORE MEALS   potassium chloride SA (K-DUR,KLOR-CON) 20 MEQ tablet Take 1 tablet by mouth  daily   propranolol (INDERAL) 40 MG tablet 80 mg in  the am and 40 mg at night   Red Yeast Rice 600 MG CAPS Take 1 capsule by mouth daily.   rivaroxaban (XARELTO) 20 MG TABS tablet Take 1 tablet (20 mg total) by mouth daily with supper. Please report any side effects to R leg following starting including worsening of bruise, new/changed edema, color variation or temperature change.   sertraline (ZOLOFT) 50 MG tablet TAKE 3 TABLETS BY MOUTH DAILY   hydrocortisone cream 1 % Apply 1 application topically as needed for itching. (Patient not taking: Reported on 01/23/2023)   No facility-administered encounter medications on  file as of 01/23/2023.    Allergies (verified) Bactrim [sulfamethoxazole-trimethoprim], Macrobid [nitrofurantoin macrocrystal], Mirabegron, Oxybutynin, and Penicillins   History: Past Medical History:  Diagnosis Date   A-fib (HCC)    Arthritis    Bell palsy 09/04/2014   GERD (gastroesophageal reflux disease)    Heart disease    Rheumatoid arthritis (HCC)    Thyroid disease    Tremors of nervous system    Past Surgical History:  Procedure Laterality Date   BREAST BIOPSY Left 1988   Benign   BREAST BIOPSY Left 1987   Benign   BREAST EXCISIONAL BIOPSY     BROW LIFT Bilateral 05/23/2016   Procedure: BLEPHAROPLASTY upper eyelid; w/excess skin;  Surgeon: Imagene Riches, MD;  Location: [facility];  Service: Ophthalmology;  Laterality: Bilateral;   CARPAL TUNNEL RELEASE Bilateral    CHOLECYSTECTOMY     Dr. Lemar Livings   COLONOSCOPY  2006   JOINT REPLACEMENT Bilateral    knees   REPLACEMENT TOTAL KNEE Left 2005   REPLACEMENT TOTAL KNEE Right 1998   Family History  Problem Relation Age of Onset   Stroke Mother    Hypertension Mother    Heart disease Father    Arthritis Father    Parkinson's disease Sister    Parkinson's disease Brother    Bladder Cancer Brother    Breast cancer Neg Hx    Kidney cancer Neg Hx    Social History   Socioeconomic History   Marital status: Married    Spouse name: Not on file   Number of children: 2   Years of education: Not on file   Highest education level: Some college, no degree  Occupational History   Not on file  Tobacco Use   Smoking status: Never   Smokeless tobacco: Never  Vaping Use   Vaping status: Never Used  Substance and Sexual Activity   Alcohol use: No   Drug use: No   Sexual activity: Not on file  Other Topics Concern   Not on file  Social History Narrative   Not on file   Social Determinants of Health   Financial Resource Strain: Low Risk  (01/23/2023)   Overall Financial Resource Strain (CARDIA)     Difficulty of Paying Living Expenses: Not hard at all  Food Insecurity: No Food Insecurity (01/23/2023)   Hunger Vital Sign    Worried About Running Out of Food in the Last Year: Never true    Ran Out of Food in the Last Year: Never true  Transportation Needs: No Transportation Needs (01/23/2023)   PRAPARE - Administrator, Civil Service (Medical): No    Lack of Transportation (Non-Medical): No  Physical Activity: Inactive (01/23/2023)   Exercise Vital Sign    Days of Exercise per Week: 0 days    Minutes of Exercise per Session: 0 min  Stress: No Stress Concern Present (01/23/2023)  [employer] of Occupational Health - Occupational Stress Questionnaire    Feeling of Stress : Only a little  Social Connections: Moderately Isolated (01/23/2023)   Social Connection and Isolation Panel [NHANES]    Frequency of Communication with Friends and Family: More than three times a week    Frequency of Social Gatherings with Friends and Family: More than three times a week    Attends Religious Services: Never    Database administrator or Organizations: No    Attends Engineer, structural: Never    Marital Status: Married    Tobacco Counseling Counseling given: Not Answered   Clinical Intake:  Pre-visit preparation completed: Yes  Pain : 0-10 Pain Score: 4  Pain Type: Chronic pain Pain Location: Shoulder Pain Orientation: Right Pain Radiating Towards: arm Pain Descriptors / Indicators: Aching, Throbbing Pain Onset: More than a month ago Pain Frequency: Intermittent     BMI - recorded: 35.3 Nutritional Status: BMI > 30  Obese Nutritional Risks: None Diabetes: No  How often do you need to have someone help you when you read instructions, pamphlets, or other written materials from your doctor or pharmacy?: 1 - Never  Interpreter Needed?: No  Information entered by :: Kennedy Bucker, LPN   Activities of Daily Living    01/23/2023    3:10 PM 12/21/2022     9:34 AM  In your present state of health, do you have any difficulty performing the following activities:  Hearing? 0 0  Vision? 0 0  Difficulty concentrating or making decisions? 0 0  Walking or climbing stairs? 1 1  Dressing or bathing? 0 1  Doing errands, shopping? 0 1  Preparing Food and eating ? N   Using the Toilet? N   In the past six months, have you accidently leaked urine? N   Do you have problems with loss of bowel control? N   Managing your Medications? N   Managing your Finances? N   Housekeeping or managing your Housekeeping? N     Patient Care Team: Erasmo Downer, MD as PCP - General (Family Medicine) Lemar Livings, Merrily Pew, MD (General Surgery) Lady Gary Darlin Priestly, MD as Consulting Physician (Cardiology) Kandyce Rud., MD as Consulting Physician (Rheumatology) Lonell Face, MD as Consulting Physician (Neurology) Vanna Scotland, MD as Consulting Physician (Urology) Dingeldein, Viviann Spare, MD (Ophthalmology) Alvira Philips Leitha Bleak (Neurology) Gaspar Cola, [facility] (Inactive) (Pharmacist)  Indicate any recent Medical Services you may have received from other than [facility] providers in the past year (date may be approximate).     Assessment:   This is a routine wellness examination for Jetta.  Hearing/Vision screen Hearing Screening - Comments:: No aids Vision Screening - Comments:: Wears glasses, has cataracts- Dr.Dingeldein    Goals Addressed             This Visit's Progress    DIET - REDUCE SUGAR INTAKE         Depression Screen    01/23/2023    3:04 PM 12/21/2022    9:34 AM 02/23/2022   10:33 AM 12/29/2021    9:56 AM 11/21/2021    2:57 PM 11/03/2021    2:29 PM 02/09/2021    1:30 PM  PHQ 2/9 Scores  PHQ - 2 Score 0 0 0 0 0 0 0  PHQ- 9 Score 0  4 0 2 2 5     Fall Risk    01/23/2023    3:09 PM 12/21/2022    9:34 AM  02/23/2022   10:33 AM 12/29/2021    9:59 AM 12/27/2021    9:14 PM  Fall Risk   Falls in the past year? 1 1 1  0  1  Number falls in past yr: 1 1 0 0 0  Injury with Fall? 1 1 1  0 1  Risk for fall due to : History of fall(s);Impaired balance/gait Impaired mobility;Impaired balance/gait History of fall(s) No Fall Risks   Follow up Falls evaluation completed;Falls prevention discussed Falls prevention discussed Falls evaluation completed;Education provided;Falls prevention discussed Falls prevention discussed;Falls evaluation completed     MEDICARE RISK AT HOME: Medicare Risk at Home Any stairs in or around the home?: No If so, are there any without handrails?: No Home free of loose throw rugs in walkways, pet beds, electrical cords, etc?: Yes Adequate lighting in your home to reduce risk of falls?: Yes Life alert?: No Use of a cane, walker or w/c?: Yes (cane and walker all the time) Grab bars in the bathroom?: No Shower chair or bench in shower?: Yes Elevated toilet seat or a handicapped toilet?: Yes  TIMED UP AND GO:  Was the test performed?  No    Cognitive Function:        01/23/2023    3:13 PM 12/29/2021   10:00 AM 11/29/2020    2:08 PM 11/18/2019    9:07 AM 07/08/2018    1:41 PM  6CIT Screen  What Year? 0 points 0 points 0 points 0 points 0 points  What month? 0 points 0 points 0 points 0 points 0 points  What time? 0 points 0 points 0 points 0 points 0 points  Count back from 20 0 points 0 points 0 points 0 points 0 points  Months in reverse 0 points 0 points 0 points 0 points 0 points  Repeat phrase 0 points 0 points 0 points 0 points 0 points  Total Score 0 points 0 points 0 points 0 points 0 points    Immunizations Immunization History  Administered Date(s) Administered   Fluad Quad(high Dose 65+) 12/10/2018, 11/18/2019, 02/23/2022   Influenza, High Dose Seasonal PF 12/11/2014, 12/13/2016, 12/27/2017   Influenza-Unspecified 11/03/2015, 01/01/2021   Moderna Covid-19 Vaccine Bivalent Booster 9yrs & up 01/01/2021   Moderna Sars-Covid-2 Vaccination 03/19/2019, 04/16/2019    Pneumococcal Conjugate-13 02/23/2014   Pneumococcal Polysaccharide-23 07/05/2017   Td 04/21/1999   Td (Adult), 2 Lf Tetanus Toxid, Preservative Free 04/21/1999   Tdap 04/21/1999    TDAP status: Due, Education has been provided regarding the importance of this vaccine. Advised may receive this vaccine at local pharmacy or Health Dept. Aware to provide a copy of the vaccination record if obtained from local pharmacy or Health Dept. Verbalized acceptance and understanding.  Flu Vaccine status: Declined, Education has been provided regarding the importance of this vaccine but patient still declined. Advised may receive this vaccine at local pharmacy or Health Dept. Aware to provide a copy of the vaccination record if obtained from local pharmacy or Health Dept. Verbalized acceptance and understanding.  Pneumococcal vaccine status: Up to date  Covid-19 vaccine status: Completed vaccines  Qualifies for Shingles Vaccine? Yes   Zostavax completed No   Shingrix Completed?: No.    Education has been provided regarding the importance of this vaccine. Patient has been advised to call insurance company to determine out of pocket expense if they have not yet received this vaccine. Advised may also receive vaccine at local pharmacy or Health Dept. Verbalized acceptance and understanding.  Screening Tests  Health Maintenance  Topic Date Due   INFLUENZA VACCINE  10/05/2022   COVID-19 Vaccine (4 - 2023-24 season) 11/05/2022   Zoster Vaccines- Shingrix (1 of 2) 03/23/2023 (Originally 11/05/1959)   DTaP/Tdap/Td (3 - Td or Tdap) 12/21/2023 (Originally 04/20/2009)   Medicare Annual Wellness (AWV)  01/23/2024   Pneumonia Vaccine 45+ Years old  Completed   DEXA SCAN  Completed   HPV VACCINES  Aged Out    Health Maintenance  Health Maintenance Due  Topic Date Due   INFLUENZA VACCINE  10/05/2022   COVID-19 Vaccine (4 - 2023-24 season) 11/05/2022    Colorectal cancer screening: No longer required.    Mammogram status: No longer required due to age.  Bone Density status: Completed 11/16/08. Results reflect: Bone density results: NORMAL. Repeat every 5 years.- PATIENT DECLINED REFERRAL  Lung Cancer Screening: (Low Dose CT Chest recommended if Age 60-80 years, 20 pack-year currently smoking OR have quit w/in 15years.) does not qualify.    Additional Screening:  Hepatitis C Screening: does not qualify; Completed NO  Vision Screening: Recommended annual ophthalmology exams for early detection of glaucoma and other disorders of the eye. Is the patient up to date with their annual eye exam?  Yes  Who is the provider or what is the name of the office in which the patient attends annual eye exams? Dr.Dingeldein If pt is not established with a provider, would they like to be referred to a provider to establish care? No .   Dental Screening: Recommended annual dental exams for proper oral hygiene   Community Resource Referral / Chronic Care Management: CRR required this visit?  No   CCM required this visit?  No     Plan:     I have personally reviewed and noted the following in the patient's chart:   Medical and social history Use of alcohol, tobacco or illicit drugs  Current medications and supplements including opioid prescriptions. Patient is not currently taking opioid prescriptions. Functional ability and status Nutritional status Physical activity Advanced directives List of other physicians Hospitalizations, surgeries, and ER visits in previous 12 months Vitals Screenings to include cognitive, depression, and falls Referrals and appointments  In addition, I have reviewed and discussed with patient certain preventive protocols, quality metrics, and best practice recommendations. A written personalized care plan for preventive services as well as general preventive health recommendations were provided to patient.     Hal Hope, LPN   [id number]   After Visit  Summary: (MyChart) Due to this being a telephonic visit, the after visit summary with patients personalized plan was offered to patient via MyChart   Nurse Notes: none

## 2023-01-23 NOTE — Patient Instructions (Addendum)
Glenda Williams , Thank you for taking time to come for your Medicare Wellness Visit. I appreciate your ongoing commitment to your health goals. Please review the following plan we discussed and let me know if I can assist you in the future.   Referrals/Orders/Follow-Ups/Clinician Recommendations: none  This is a list of the screening recommended for you and due dates:  Health Maintenance  Topic Date Due   Flu Shot  10/05/2022   COVID-19 Vaccine (4 - 2023-24 season) 11/05/2022   Zoster (Shingles) Vaccine (1 of 2) 03/23/2023*   DTaP/Tdap/Td vaccine (3 - Td or Tdap) 12/21/2023*   Medicare Annual Wellness Visit  01/23/2024   Pneumonia Vaccine  Completed   DEXA scan (bone density measurement)  Completed   HPV Vaccine  Aged Out  *Topic was postponed. The date shown is not the original due date.    Advanced directives: (ACP Link)Information on Advanced Care Planning can be found at [facility] Advance Health Care Directives Advance Health Care Directives ([email or web address]   Next Medicare Annual Wellness Visit scheduled for next year: Yes    01/29/24 @ 2:30 by video

## 2023-01-24 ENCOUNTER — Ambulatory Visit: Payer: Medicare Other

## 2023-01-31 ENCOUNTER — Telehealth: Payer: Self-pay | Admitting: Family Medicine

## 2023-01-31 NOTE — Telephone Encounter (Signed)
Returned call to [employer]. Approved updated for synthroid manufacture change   Ronnald Ramp, MD

## 2023-01-31 NOTE — Telephone Encounter (Signed)
Shanda Bumps w/ [employer] calling for approval to change manufactures of the pt's  levothyroxine (SYNTHROID) 125 MCG tablet   (to Lupin)  Please call [phone] Ref  [id number]

## 2023-02-02 ENCOUNTER — Other Ambulatory Visit: Payer: Self-pay

## 2023-02-05 ENCOUNTER — Other Ambulatory Visit: Payer: Self-pay | Admitting: Family Medicine

## 2023-02-05 NOTE — Telephone Encounter (Signed)
Medication Refill -  Most Recent Primary Care Visit:  Provider: Hal Hope  Department: [facility]  Visit Type: MEDICARE AWV, SEQUENTIAL  Date: 01/23/2023  Medication: levothyroxine (SYNTHROID) 125 MCG tablet [[id number]]  gabapentin (NEURONTIN) 100 MG capsule [[id number]]   Has the patient contacted their pharmacy? Yes  (Agent: If yes, when and what did the pharmacy advise?)  Is this the correct pharmacy for this prescription? Yes  This is the patient's preferred pharmacy:   [id number] - [place], [place] - [address] AT [facility] [address] [place] [address] Phone: [phone] Fax: [phone]    Has the prescription been filled recently? Yes  Is the patient out of the medication? Yes  Has the patient been seen for an appointment in the last year OR does the patient have an upcoming appointment? Yes  Can we respond through MyChart? No  Agent: Please be advised that Rx refills may take up to 3 business days. We ask that you follow-up with your pharmacy.  Pt spoke with [employer] and they told her to contact the office for a short supply to last until her medication came. Pt says it should come within 3-5 business days. Pt is requesting a call when it's sent.

## 2023-02-06 ENCOUNTER — Encounter: Payer: Self-pay | Admitting: Family Medicine

## 2023-02-06 DIAGNOSIS — Z9225 Personal history of immunosupression therapy: Secondary | ICD-10-CM | POA: Diagnosis not present

## 2023-02-06 DIAGNOSIS — J019 Acute sinusitis, unspecified: Secondary | ICD-10-CM | POA: Diagnosis not present

## 2023-02-06 DIAGNOSIS — Z03818 Encounter for observation for suspected exposure to other biological agents ruled out: Secondary | ICD-10-CM | POA: Diagnosis not present

## 2023-02-06 DIAGNOSIS — U071 COVID-19: Secondary | ICD-10-CM | POA: Diagnosis not present

## 2023-02-08 NOTE — Telephone Encounter (Signed)
Requested medication (s) are due for refill today:   Yes for both  Requested medication (s) are on the active medication list:   Yes for both but the gabapentin is from a historical provider  Future visit scheduled:   AWV 01/29/2024   Last ordered: Synthroid 12/04/2022 #90, 1 refill;    Gabapentin 05/19/2021 from a historical provider   TSH is due Dec. 2024  Requesting a short supply of Synthroid until mail order arrives.  Per Robynn Pane pt to check with Dr. Beryle Flock because her TSH is due this month (Dec).     Requested Prescriptions  Pending Prescriptions Disp Refills   gabapentin (NEURONTIN) 100 MG capsule      Sig: Take 1 capsule (100 mg total) by mouth 3 (three) times daily. Per patient taking BID     Neurology: Anticonvulsants - gabapentin Passed - 02/05/2023  2:43 PM      Passed - Cr in normal range and within 360 days    Creatinine  Date Value Ref Range Status  02/13/2014 0.77 0.60 - 1.30 mg/dL Final   Creatinine, Ser  Date Value Ref Range Status  02/23/2022 0.76 0.57 - 1.00 mg/dL Final         Passed - Completed PHQ-2 or PHQ-9 in the last 360 days      Passed - Valid encounter within last 12 months    Recent Outpatient Visits           1 month ago Contusion of left chest wall, initial encounter   [facility] Danelle Berry, PA-C   11 months ago Primary hypertension   [facility], Marzella Schlein, MD   1 year ago Pseudoaneurysm [facility])   [facility] Jacky Kindle, FNP   1 year ago Hospital discharge follow-up   [facility] Jacky Kindle, FNP   1 year ago Adult hypothyroidism   [facility] [place], Marzella Schlein, MD               levothyroxine (SYNTHROID) 125 MCG tablet 90 tablet 1    Sig: Take 1 tablet (125 mcg total) by mouth daily.     Endocrinology:  Hypothyroid Agents Passed - 02/05/2023  2:43 PM      Passed - TSH in  normal range and within 360 days    TSH  Date Value Ref Range Status  02/23/2022 2.730 0.450 - 4.500 uIU/mL Final         Passed - Valid encounter within last 12 months    Recent Outpatient Visits           1 month ago Contusion of left chest wall, initial encounter   [facility] Danelle Berry, PA-C   11 months ago Primary hypertension   [facility], Marzella Schlein, MD   1 year ago Pseudoaneurysm [facility])    [facility] Jacky Kindle, FNP   1 year ago Hospital discharge follow-up   [facility] Jacky Kindle, FNP   1 year ago Adult hypothyroidism   [facility] [place], Marzella Schlein, MD

## 2023-02-09 MED ORDER — GABAPENTIN 100 MG PO CAPS: 100.0000 mg | ORAL_CAPSULE | Freq: Two times a day (BID) | ORAL | 1 refills | Status: AC

## 2023-02-09 MED ORDER — LEVOTHYROXINE SODIUM 125 MCG PO TABS: 125.0000 ug | ORAL_TABLET | Freq: Every day | ORAL | 1 refills | Status: DC

## 2023-02-14 ENCOUNTER — Encounter: Payer: Self-pay | Admitting: Family Medicine

## 2023-02-14 ENCOUNTER — Ambulatory Visit (INDEPENDENT_AMBULATORY_CARE_PROVIDER_SITE_OTHER): Payer: Medicare Other | Admitting: Family Medicine

## 2023-02-14 VITALS — BP 120/61 | HR 65 | Temp 98.2°F | Wt 212.0 lb

## 2023-02-14 DIAGNOSIS — R058 Other specified cough: Secondary | ICD-10-CM

## 2023-02-14 DIAGNOSIS — J329 Chronic sinusitis, unspecified: Secondary | ICD-10-CM | POA: Diagnosis not present

## 2023-02-14 DIAGNOSIS — R0981 Nasal congestion: Secondary | ICD-10-CM

## 2023-02-14 HISTORY — DX: Chronic sinusitis, unspecified: J32.9

## 2023-02-14 HISTORY — DX: Nasal congestion: R09.81

## 2023-02-14 HISTORY — DX: Other specified cough: R05.8

## 2023-02-14 MED ORDER — GUAIFENESIN 200 MG PO TABS: 200.0000 mg | ORAL_TABLET | ORAL | 0 refills | Status: DC | PRN

## 2023-02-14 MED ORDER — AZELASTINE HCL 0.1 % NA SOLN: 2.0000 | Freq: Two times a day (BID) | NASAL | 12 refills | Status: AC

## 2023-02-14 NOTE — Assessment & Plan Note (Signed)
Discussed adding OTC nasal saline spray to help loosen and clear build up of secretions in nasal/sinus passages.  May continue to use flonase, intranasal spray to reduce inflammation in sinuses.  Added Azelastine intranasal spray to help relieve allergic response in nasal passages and sinuses.  Discussed using saline spray first, waiting 20 minutes between each medicated nasal spray for best absorption. Humidification and increasing daily fluids to help loosen and clear secretions.

## 2023-02-14 NOTE — Assessment & Plan Note (Addendum)
Finish Cefdinir as prescribed for your acute sinusitis - May use humidification at home to help with congestion and sinus pressure relief.  -May use tylenol every 5-6hrs for sinus pain relief.

## 2023-02-14 NOTE — Assessment & Plan Note (Addendum)
Productive cough with sputum.  - Mucinex 200mg  tablet as needed every 4 hours to help loosen secretions, expectorate sputum, and assist pt in reducing excess sputum. - Discussed deep breathing exercises to expand lungs and help decrease risk of developing further infection. Continue to spit out sputum, do not swallow. - May use previously prescribed promethazine- DM to help with cough at night

## 2023-02-14 NOTE — Progress Notes (Signed)
Acute Office Visit  Introduced to nurse practitioner role and practice setting.  All questions answered.  Discussed provider/patient relationship and expectations.  Subjective:     Patient ID: Glenda Williams, female    DOB: 25-Jun-1940, 82 y.o.   MRN: [id number]  Chief Complaint  Patient presents with   Cough    Productive cough with light creamy phlegm associated with nasal congestion, fatigue. Reports no fever. Patient reports taking allegra-d, coricidin hbp, tylenol and rx for Flonase, promethazine dm syrup, cefdnir 300 mg capsules for 7 days. Patient report symptoms are lingering after recent covid positive last week    Pt presents her husband with congestion, runny nose, and sputum production, post COVID positive on 02/06/23, along with acute sinusitis. She was treated with cefdinir (which she still has one day left of treatment), molnupiravir, flonase intranasal, and Promethazine-DM at [facility].  She states over the last 2-3 days she is starting to feel better, but continues to have congestion and sputum production. She is able to cough up sputum. She is hoping there maybe be other medication management options for these symptoms.   She denies, headaches, fevers, chills, shortness of breath, difficulty breathing, dizziness, rigors, palpitations, tachypnea, tachycardia, or inability to intake fluids or foods. Denies drooling or difficulty swallowing.   Review of Systems  HENT:  Positive for congestion and sinus pain. Negative for ear discharge, ear pain, hearing loss, nosebleeds, sore throat and tinnitus.        States sinus pain improving for last 3 days compared to one week ago.  Respiratory:  Positive for cough and sputum production. Negative for hemoptysis, shortness of breath, wheezing and stridor.   Cardiovascular:  Negative for chest pain.  Gastrointestinal:  Negative for abdominal pain, constipation, diarrhea, nausea and vomiting.      Objective:    BP 120/61  (BP Location: Right Arm, Patient Position: Sitting, Cuff Size: Normal)   Pulse 65   Temp 98.2 F (36.8 C) (Oral)   Wt 212 lb (96.2 kg)   SpO2 99%   BMI 35.28 kg/m    Physical Exam Vitals reviewed.  Constitutional:      General: She is not in acute distress.    Appearance: Normal appearance. She is not ill-appearing, toxic-appearing or diaphoretic.  HENT:     Head: Normocephalic.     Salivary Glands: Right salivary gland is not diffusely enlarged or tender. Left salivary gland is not diffusely enlarged or tender.     Right Ear: Hearing, tympanic membrane, ear canal and external ear normal.     Left Ear: Hearing, tympanic membrane, ear canal and external ear normal.     Nose: Congestion and rhinorrhea present.     Right Turbinates: Swollen.     Left Turbinates: Swollen.     Comments: Pt had mild tenderness to sinus pressure - states improved compared to where is was 3-7 days ago.    Mouth/Throat:     Mouth: Mucous membranes are moist.     Tongue: No lesions.     Palate: No lesions.     Pharynx: Posterior oropharyngeal erythema and postnasal drip present. No oropharyngeal exudate.     Tonsils: No tonsillar exudate.  Eyes:     Extraocular Movements: Extraocular movements intact.     Conjunctiva/sclera: Conjunctivae normal.     Pupils: Pupils are equal, round, and reactive to light.  Cardiovascular:     Rate and Rhythm: Normal rate and regular rhythm.     Pulses: Normal  pulses.  Pulmonary:     Effort: Pulmonary effort is normal. No respiratory distress.     Breath sounds: Normal breath sounds. No stridor. No wheezing or rhonchi.  Chest:     Chest wall: No tenderness.  Skin:    General: Skin is warm and dry.     Capillary Refill: Capillary refill takes less than 2 seconds.  Neurological:     General: No focal deficit present.     Mental Status: She is alert and oriented to person, place, and time. Mental status is at baseline.  Psychiatric:        Mood and Affect: Mood  normal.        Behavior: Behavior normal.        Thought Content: Thought content normal.        Judgment: Judgment normal.    No results found for any visits on 02/14/23.      Assessment & Plan:   Problem List Items Addressed This Visit       Respiratory   Rhinosinusitis - Primary    Finish Cefdinir as prescribed for your acute sinusitis - May use humidification at home to help with congestion and sinus pressure relief.  -May use tylenol every 5-6hrs for sinus pain relief.       Relevant Medications   guaiFENesin 200 MG tablet   azelastine (ASTELIN) 0.1 % nasal spray   Congestion of nasal sinus    Discussed adding OTC nasal saline spray to help loosen and clear build up of secretions in nasal/sinus passages.  May continue to use flonase, intranasal spray to reduce inflammation in sinuses.  Added Azelastine intranasal spray to help relieve allergic response in nasal passages and sinuses.  Discussed using saline spray first, waiting 20 minutes between each medicated nasal spray for best absorption. Humidification and increasing daily fluids to help loosen and clear secretions.        Other   Cough with sputum    Productive cough with sputum.  - Mucinex 200mg  tablet as needed every 4 hours to help loosen secretions, expectorate sputum, and assist pt in reducing excess sputum. - Discussed deep breathing exercises to expand lungs and help decrease risk of developing further infection. Continue to spit out sputum, do not swallow. - May use previously prescribed promethazine- DM to help with cough at night       Relevant Medications   guaiFENesin 200 MG tablet     Meds ordered this encounter  Medications   guaiFENesin 200 MG tablet    Sig: Take 1 tablet (200 mg total) by mouth every 4 (four) hours as needed for cough or to loosen phlegm.    Dispense:  30 suppository    Refill:  0   azelastine (ASTELIN) 0.1 % nasal spray    Sig: Place 2 sprays into both nostrils 2  (two) times daily. Use in each nostril as directed    Dispense:  30 mL    Refill:  12   Return if symptoms worsen or fail to improve.  I, Sallee Provencal, FNP, have reviewed all documentation for this visit. The documentation on 02/14/23 for the exam, diagnosis, procedures, and orders are all accurate and complete.   Sallee Provencal, FNP

## 2023-02-21 DIAGNOSIS — M0579 Rheumatoid arthritis with rheumatoid factor of multiple sites without organ or systems involvement: Secondary | ICD-10-CM | POA: Diagnosis not present

## 2023-03-15 DIAGNOSIS — M5416 Radiculopathy, lumbar region: Secondary | ICD-10-CM | POA: Diagnosis not present

## 2023-03-29 DIAGNOSIS — M25552 Pain in left hip: Secondary | ICD-10-CM | POA: Diagnosis not present

## 2023-03-29 DIAGNOSIS — G8929 Other chronic pain: Secondary | ICD-10-CM | POA: Diagnosis not present

## 2023-03-29 DIAGNOSIS — M5416 Radiculopathy, lumbar region: Secondary | ICD-10-CM | POA: Diagnosis not present

## 2023-03-29 DIAGNOSIS — M5442 Lumbago with sciatica, left side: Secondary | ICD-10-CM | POA: Diagnosis not present

## 2023-04-13 ENCOUNTER — Ambulatory Visit: Payer: Self-pay | Admitting: *Deleted

## 2023-04-13 ENCOUNTER — Ambulatory Visit: Payer: Medicare Other | Admitting: Family Medicine

## 2023-04-13 ENCOUNTER — Encounter: Payer: Self-pay | Admitting: Family Medicine

## 2023-04-13 VITALS — BP 120/70 | HR 61 | Temp 97.5°F | Resp 16 | Ht 65.0 in | Wt 212.0 lb

## 2023-04-13 DIAGNOSIS — J069 Acute upper respiratory infection, unspecified: Secondary | ICD-10-CM

## 2023-04-13 DIAGNOSIS — R062 Wheezing: Secondary | ICD-10-CM | POA: Diagnosis not present

## 2023-04-13 MED ORDER — BENZONATATE 100 MG PO CAPS: 100.0000 mg | ORAL_CAPSULE | Freq: Three times a day (TID) | ORAL | 0 refills | Status: DC | PRN

## 2023-04-13 MED ORDER — PREDNISONE 20 MG PO TABS
ORAL_TABLET | ORAL | 0 refills | Status: AC
Start: 2023-04-13 — End: ?

## 2023-04-13 MED ORDER — ALBUTEROL SULFATE HFA 108 (90 BASE) MCG/ACT IN AERS: 1.0000 | INHALATION_SPRAY | RESPIRATORY_TRACT | 3 refills | Status: AC | PRN

## 2023-04-13 NOTE — Telephone Encounter (Signed)
 cough Reason for Disposition  [1] MILD difficulty breathing (e.g., minimal/no SOB at rest, SOB with walking, pulse <100) AND [2] NEW-onset or WORSE than normal  Answer Assessment - Initial Assessment Questions 1. RESPIRATORY STATUS: Describe your breathing? (e.g., wheezing, shortness of breath, unable to speak, severe coughing)      wheezing 2. ONSET: When did this breathing problem begin?      yesterday 3. PATTERN Does the difficult breathing come and go, or has it been constant since it started?      Wheezing with congestion 4. SEVERITY: How bad is your breathing? (e.g., mild, moderate, severe)    - MILD: No SOB at rest, mild SOB with walking, speaks normally in sentences, can lie down, no retractions, pulse < 100.    - MODERATE: SOB at rest, SOB with minimal exertion and prefers to sit, cannot lie down flat, speaks in phrases, mild retractions, audible wheezing, pulse 100-120.    - SEVERE: Very SOB at rest, speaks in single words, struggling to breathe, sitting hunched forward, retractions, pulse > 120      mild 5. RECURRENT SYMPTOM: Have you had difficulty breathing before? If Yes, ask: When was the last time? and What happened that time?     no 6. CARDIAC HISTORY: Do you have any history of heart disease? (e.g., heart attack, angina, bypass surgery, angioplasty)      CHF 7. LUNG HISTORY: Do you have any history of lung disease?  (e.g., pulmonary embolus, asthma, emphysema)     allergies 8. CAUSE: What do you think is causing the breathing problem?      congestion 9. OTHER SYMPTOMS: Do you have any other symptoms? (e.g., dizziness, runny nose, cough, chest pain, fever)     congestion  Protocols used: Breathing Difficulty-A-AH

## 2023-04-13 NOTE — Telephone Encounter (Signed)
 Patient's son, Mark(DPR) calling reporting: Chief Complaint: cough, congestion, wheezing Symptoms: Patient's husband recently ill with congestion and patient has started cough, congestion, wheezing- using prescribed inhaler Frequency: symptoms started yesterday  Pertinent Negatives: Patient denies SOB last night Disposition: [] ED /[] Urgent Care (no appt availability in office) / [x] Appointment(In office/virtual)/ []  [place] [facility]/ [] Home Care/ [] Refused Recommended Disposition /[] [place] [facility]/ []  Follow-up with PCP Additional Notes: Patient's son is calling to request appointment for patient- cough ,congestion, wheezing last night - he is not with her now- but is going over shortly- he advised her last night to call for appointment- but she has not- so he is calling.

## 2023-04-13 NOTE — Progress Notes (Signed)
 Patient ID: Glenda Williams, female    DOB: [birth year], 83 y.o.   MRN: [id number]  PCP: Myrla Jon HERO, MD  Chief Complaint  Patient presents with   Cough    Started yesterday, non-productive. Cough drops helped.   Wheezing    Subjective:   Glenda Williams is a 83 y.o. female, presents to clinic with CC of the following:  HPI  Cough and complaints above No fever, body aches, no CP, no exertional SOB, but sounds wheezy, cough is mild, dry, started yesterday, some irritation in throat and upper chest when coughing Hx of afib and on chart is CHF pt does not believe she has  She denies any increased weight, DOE, and no change to her chronic and baseline LE edema Husband/sick contact all week - similar sx She does have an inhaler and has needed it previously with acute illnesses  Patient Active Problem List   Diagnosis Date Noted   Rhinosinusitis 02/14/2023   Congestion of nasal sinus 02/14/2023   Cough with sputum 02/14/2023   Pseudoaneurysm (HCC) 11/03/2021   Acute blood loss anemia 10/23/2021   Depression with anxiety 10/23/2021   Obesity with body mass index (BMI) of 30.0 to 39.9 10/23/2021   Compression fracture of body of thoracic vertebra_T8, chronic 10/23/2021   HTN (hypertension) 10/23/2021   Chronic diastolic CHF (congestive heart failure) (HCC) 10/23/2021   Poor mobility 08/11/2021   Use of cane as ambulatory aid 08/11/2021   Bilateral leg edema 08/11/2021   Neuropathy 08/11/2021   Bilateral flank pain 11/11/2020   Valvular heart disease 08/13/2019   Current use of long term anticoagulation 05/07/2019   Hypertension 04/26/2015   Dry mouth 04/26/2015   Hypokalemia 08/12/2014   Allergic rhinitis 08/04/2014   Anxiety 08/04/2014   Neuropathic pain 08/04/2014   Atrial fibrillation (HCC) 08/04/2014   Avitaminosis D 08/04/2014   Gastroesophageal reflux disease without esophagitis 11/07/2013   Polypharmacy 08/19/2013   Osteoarthritis 06/11/2013   Rheumatoid  arthritis (HCC) 06/11/2013   Hypercholesteremia 05/12/2009   Benign essential tremor 05/12/2009   Prediabetes 08/26/2008   Adult hypothyroidism 08/26/2008      Current Outpatient Medications:    albuterol  (VENTOLIN  HFA) 108 (90 Base) MCG/ACT inhaler, Inhale 1-2 puffs into the lungs every 4 (four) hours as needed for wheezing or shortness of breath., Disp: 26.8 g, Rfl: 3   azelastine  (ASTELIN ) 0.1 % nasal spray, Place 2 sprays into both nostrils 2 (two) times daily. Use in each nostril as directed, Disp: 30 mL, Rfl: 12   cetirizine (ZYRTEC) 10 MG tablet, Take 10 mg by mouth daily as needed., Disp: , Rfl:    cholecalciferol  (VITAMIN D ) 400 units TABS tablet, Take 400 Units by mouth daily., Disp: , Rfl:    conjugated estrogens  (PREMARIN ) vaginal cream, Apply 0.5mg  (pea-sized amount)  just inside the vaginal introitus with a finger-tip on  Monday, Wednesday and Friday nights., Disp: 30 g, Rfl: 12   EPINEPHrine  0.3 mg/0.3 mL IJ SOAJ injection, Inject 0.3 mLs into the skin once as needed for anaphylaxis., Disp: , Rfl: 1   folic acid  (FOLVITE ) 1 MG tablet, Take 1 mg by mouth daily., Disp: , Rfl:    gabapentin  (NEURONTIN ) 100 MG capsule, Take 1 capsule (100 mg total) by mouth 2 (two) times daily., Disp: 180 capsule, Rfl: 1   guaiFENesin  200 MG tablet, Take 1 tablet (200 mg total) by mouth every 4 (four) hours as needed for cough or to loosen phlegm., Disp: 30 suppository, Rfl:  0   hydrochlorothiazide  (HYDRODIURIL ) 25 MG tablet, Take 25 mg by mouth daily., Disp: , Rfl:    hydrocortisone  cream 1 %, Apply 1 application  topically as needed for itching., Disp: , Rfl:    ketoconazole  (NIZORAL ) 2 % cream, Apply 1 application topically daily., Disp: 30 g, Rfl: 2   levothyroxine  (SYNTHROID ) 125 MCG tablet, Take 1 tablet (125 mcg total) by mouth daily., Disp: 90 tablet, Rfl: 1   methotrexate  (RHEUMATREX) 2.5 MG tablet, Take 20 mg by mouth every Thursday., Disp: , Rfl:    NYSTATIN  powder, APPLY TOPICALLY TO  SKIN FOLDS THREE TIMES DAILY, Disp: 60 g, Rfl: 1   Omega-3 Fatty Acids (FISH OIL) 1000 MG CAPS, Take 1 capsule by mouth daily., Disp: , Rfl:    omeprazole  (PRILOSEC) 20 MG capsule, TAKE 1 CAPSULE BY MOUTH TWICE  DAILY BEFORE MEALS, Disp: 180 capsule, Rfl: 3   potassium chloride  SA (K-DUR,KLOR-CON ) 20 MEQ tablet, Take 1 tablet by mouth  daily, Disp: 90 tablet, Rfl: 1   propranolol  (INDERAL ) 40 MG tablet, 80 mg in the am and 40 mg at night, Disp: , Rfl:    Red Yeast Rice 600 MG CAPS, Take 1 capsule by mouth daily., Disp: , Rfl:    rivaroxaban  (XARELTO ) 20 MG TABS tablet, Take 1 tablet (20 mg total) by mouth daily with supper. Please report any side effects to R leg following starting including worsening of bruise, new/changed edema, color variation or temperature change., Disp: 30 tablet, Rfl: 5   sertraline  (ZOLOFT ) 50 MG tablet, TAKE 3 TABLETS BY MOUTH DAILY, Disp: 270 tablet, Rfl: 3   Allergies  Allergen Reactions   Bactrim  [Sulfamethoxazole -Trimethoprim ] Anaphylaxis   Macrobid  [Nitrofurantoin  Macrocrystal] Rash   Mirabegron  Rash   Oxybutynin  Rash   Penicillins Rash    Has patient had a PCN reaction causing immediate rash, facial/tongue/throat swelling, SOB or lightheadedness with hypotension: Yes Has patient had a PCN reaction causing severe rash involving mucus membranes or skin necrosis: No Has patient had a PCN reaction that required hospitalization: No Has patient had a PCN reaction occurring within the last 10 years: Unknown If all of the above answers are NO, then may proceed with Cephalosporin use.      Social History   Tobacco Use   Smoking status: Never   Smokeless tobacco: Never  Vaping Use   Vaping status: Never Used  Substance Use Topics   Alcohol use: No   Drug use: No      Chart Review Today: I personally reviewed active problem list, medication list, allergies, family history, social history, health maintenance, notes from last encounter, lab results,  imaging with the patient/caregiver today.   Review of Systems  Constitutional: Negative.  Negative for activity change, appetite change, chills, diaphoresis, fatigue and fever.  HENT: Negative.    Eyes: Negative.   Respiratory:  Positive for cough and wheezing. Negative for apnea, choking, chest tightness, shortness of breath and stridor.   Cardiovascular: Negative.  Negative for chest pain, palpitations and leg swelling.  Gastrointestinal: Negative.   Endocrine: Negative.   Genitourinary: Negative.   Musculoskeletal: Negative.   Skin: Negative.   Allergic/Immunologic: Negative.   Neurological: Negative.   Hematological: Negative.   Psychiatric/Behavioral: Negative.    All other systems reviewed and are negative.      Objective:   Vitals:   04/13/23 1307  BP: 120/70  Pulse: 61  Resp: 16  Temp: (!) 97.5 F (36.4 C)  SpO2: 98%  Weight: 212 lb (96.2  kg)  Height: 5' 5 (1.651 m)    Body mass index is 35.28 kg/m.  Physical Exam Vitals and nursing note reviewed.  Constitutional:      General: She is not in acute distress.    Appearance: Normal appearance. She is well-developed. She is obese. She is not ill-appearing, toxic-appearing or diaphoretic.     Comments: Examined in WC  HENT:     Head: Normocephalic and atraumatic.     Right Ear: Tympanic membrane, ear canal and external ear normal. There is no impacted cerumen.     Left Ear: Tympanic membrane, ear canal and external ear normal. There is no impacted cerumen.     Nose: Rhinorrhea present.     Mouth/Throat:     Mouth: Mucous membranes are moist.     Pharynx: Oropharynx is clear. No oropharyngeal exudate or posterior oropharyngeal erythema.  Eyes:     General: No scleral icterus.       Right eye: No discharge.        Left eye: No discharge.     Conjunctiva/sclera: Conjunctivae normal.  Neck:     Trachea: No tracheal deviation.  Cardiovascular:     Rate and Rhythm: Normal rate and regular rhythm.     Pulses:  Normal pulses.     Heart sounds: Normal heart sounds. No murmur heard.    No friction rub. No gallop.     Comments: Non-pitting bilateral - chronic/baseline per pt Pulmonary:     Effort: Pulmonary effort is normal. No respiratory distress.     Breath sounds: No stridor. Wheezing present. No rhonchi or rales.  Chest:     Chest wall: No tenderness.  Abdominal:     General: Bowel sounds are normal.     Palpations: Abdomen is soft.  Musculoskeletal:     Right lower leg: Edema present.     Left lower leg: Edema present.  Skin:    General: Skin is warm and dry.     Findings: No rash.  Neurological:     Mental Status: She is alert.     Motor: No abnormal muscle tone.     Coordination: Coordination normal.  Psychiatric:        Behavior: Behavior normal.      Results for orders placed or performed in visit on 02/23/22  TSH   Collection Time: 02/23/22 11:06 AM  Result Value Ref Range   TSH 2.730 0.450 - 4.500 uIU/mL  Comprehensive metabolic panel   Collection Time: 02/23/22 11:06 AM  Result Value Ref Range   Glucose 81 70 - 99 mg/dL   BUN 13 8 - 27 mg/dL   Creatinine, Ser 9.23 0.57 - 1.00 mg/dL   eGFR 79 >40 fO/fpw/8.26   BUN/Creatinine Ratio 17 12 - 28   Sodium 141 134 - 144 mmol/L   Potassium 4.2 3.5 - 5.2 mmol/L   Chloride 98 96 - 106 mmol/L   CO2 26 20 - 29 mmol/L   Calcium 9.5 8.7 - 10.3 mg/dL   Total Protein 6.9 6.0 - 8.5 g/dL   Albumin 3.5 (L) 3.8 - 4.8 g/dL   Globulin, Total 3.4 1.5 - 4.5 g/dL   Albumin/Globulin Ratio 1.0 (L) 1.2 - 2.2   Bilirubin Total 0.6 0.0 - 1.2 mg/dL   Alkaline Phosphatase 91 44 - 121 IU/L   AST 24 0 - 40 IU/L   ALT 11 0 - 32 IU/L  CBC   Collection Time: 02/23/22 11:06 AM  Result Value Ref Range  WBC 7.8 3.4 - 10.8 x10E3/uL   RBC 4.26 3.77 - 5.28 x10E6/uL   Hemoglobin 11.2 11.1 - 15.9 g/dL   Hematocrit 64.5 65.9 - 46.6 %   MCV 83 79 - 97 fL   MCH 26.3 (L) 26.6 - 33.0 pg   MCHC 31.6 31.5 - 35.7 g/dL   RDW 85.0 88.2 - 84.5 %    Platelets 303 150 - 450 x10E3/uL  Lipid panel   Collection Time: 02/23/22 11:06 AM  Result Value Ref Range   Cholesterol, Total 142 100 - 199 mg/dL   Triglycerides 74 0 - 149 mg/dL   HDL 37 (L) >60 mg/dL   VLDL Cholesterol Cal 15 5 - 40 mg/dL   LDL Chol Calc (NIH) 90 0 - 99 mg/dL   Chol/HDL Ratio 3.8 0.0 - 4.4 ratio  Hemoglobin A1c   Collection Time: 02/23/22 11:06 AM  Result Value Ref Range   Hgb A1c MFr Bld 6.1 (H) 4.8 - 5.6 %   Est. average glucose Bld gHb Est-mCnc 128 mg/dL       Assessment & Plan:   1. Wheeze (Primary) Inspiratory and expiratory wheeze and audible wheeze with 1 d of acute URI illness Patient does not have any chest pain or shortness of breath She does have albuterol  on her chart but no past pulmonary diagnoses She has not tried her inhaler yet We will treat her for acute bronchitis with wheeze with steroids inhalers Mucinex  and other supportive measures for URI - predniSONE  (DELTASONE ) 20 MG tablet; 2 tabs poqday 1-3, 1 tabs poqday 4-6  Dispense: 9 tablet; Refill: 0 - albuterol  (VENTOLIN  HFA) 108 (90 Base) MCG/ACT inhaler; Inhale 1-2 puffs into the lungs every 4 (four) hours as needed for wheezing or shortness of breath.  Dispense: 26.8 g; Refill: 3  2. Upper respiratory tract infection, unspecified type Supportive and symptomatic care reviewed with the patient - benzonatate  (TESSALON ) 100 MG capsule; Take 1-2 capsules (100-200 mg total) by mouth 3 (three) times daily as needed for cough.  Dispense: 30 capsule; Refill: 0  We did discuss at length with pt and family member other etiologies of wheeze -even though she has CHF on her chart she denies any increased weight lower extremity edema orthopnea or exertional dyspnea.  She did have a sick contact in her home her husband.  I tried to reviewed the chart further to see her pulmonary history or any past PFTs however I cannot find specific history of asthma or COPD She had inspiratory and expiratory wheeze I did  not hear any rales or rhonchi, she did not have any concerning constitutional symptoms, vital signs were stable.  With her comorbidities I did encourage them to seek close follow-up care if she has any worsening, at this time feel above tx plan is appropriate    Michelene Cower, PA-C 04/13/23 1:14 PM

## 2023-04-20 ENCOUNTER — Ambulatory Visit
Admission: EM | Admit: 2023-04-20 | Discharge: 2023-04-20 | Disposition: A | Discharge status: Home / Self Care | Payer: Medicare Other | Attending: Emergency Medicine | Admitting: Emergency Medicine

## 2023-04-20 ENCOUNTER — Ambulatory Visit: Payer: Self-pay | Admitting: *Deleted

## 2023-04-20 DIAGNOSIS — J22 Unspecified acute lower respiratory infection: Secondary | ICD-10-CM | POA: Diagnosis not present

## 2023-04-20 MED ORDER — AEROCHAMBER HOLDING CHAMBER DEVI: 2.0000 | 0 refills | Status: AC | PRN

## 2023-04-20 MED ORDER — DOXYCYCLINE HYCLATE 100 MG PO CAPS: 100.0000 mg | ORAL_CAPSULE | Freq: Two times a day (BID) | ORAL | 0 refills | Status: AC

## 2023-04-20 NOTE — ED Triage Notes (Signed)
Pt is with her daughter  Pt c/o Cough, wheezing x1week  Pt was given prednisone, tessalon, and a inhaler from her PCP and has been taking OTC tylenol, mucinex, and advil.  Pt has had continued wheezing and temperature of 100.  Pt has had Low O2 around 90.

## 2023-04-20 NOTE — Telephone Encounter (Signed)
Reason for Disposition  [1] MILD difficulty breathing (e.g., minimal/no SOB at rest, SOB with walking, pulse <100) AND [2] NEW-onset or WORSE than normal  Answer Assessment - Initial Assessment Questions 1. RESPIRATORY STATUS: "Describe your breathing?" (e.g., wheezing, shortness of breath, unable to speak, severe coughing)      Wheezing, O2 sat- 87-93%, fever- off/on-100 all night,98.3 am 2. ONSET: "When did this breathing problem begin?"      1 week  4. SEVERITY: "How bad is your breathing?" (e.g., mild, moderate, severe)    - MILD: No SOB at rest, mild SOB with walking, speaks normally in sentences, can lie down, no retractions, pulse < 100.    - MODERATE: SOB at rest, SOB with minimal exertion and prefers to sit, cannot lie down flat, speaks in phrases, mild retractions, audible wheezing, pulse 100-120.    - SEVERE: Very SOB at rest, speaks in single words, struggling to breathe, sitting hunched forward, retractions, pulse > 120      Wheezing, mild 5. RECURRENT SYMPTOM: "Have you had difficulty breathing before?" If Yes, ask: "When was the last time?" and "What happened that time?"      Not this severe 6. CARDIAC HISTORY: "Do you have any history of heart disease?" (e.g., heart attack, angina, bypass surgery, angioplasty)      CHF  9. OTHER SYMPTOMS: "Do you have any other symptoms? (e.g., dizziness, runny nose, cough, chest pain, fever)     Cough, fever 10. O2 SATURATION MONITOR:  "Do you use an oxygen saturation monitor (pulse oximeter) at home?" If Yes, ask: "What is your reading (oxygen level) today?" "What is your usual oxygen saturation reading?" (e.g., 95%)       87-93%  Protocols used: Breathing Difficulty-A-AH

## 2023-04-20 NOTE — Telephone Encounter (Signed)
  Chief Complaint: wheezing, cough Symptoms: mild SOB, O2 sat- fluctuating 87-93%, cough- yellow sputum, fever Frequency: ongoing OV 04/13/23- not better  Disposition: [] ED /[x] Urgent Care (no appt availability in office) / [] Appointment(In office/virtual)/ []  [place] [facility]/ [] Home Care/ [] Refused Recommended Disposition /[] [place] [facility]/ []  Follow-up with PCP Additional Notes: Call to office- no open appointment- UC/ED advised- dependant on O2 sat- UC may advise ED- family made aware

## 2023-04-20 NOTE — ED Provider Notes (Signed)
 Glenda Williams    CSN: [id number] Arrival date & time: 04/20/23  1105      History   Chief Complaint Chief Complaint  Patient presents with   Cough    HPI Glenda Williams is a 83 y.o. female.   83 year old female, Glenda Williams, presents to urgent care with daughter for evaluation of cough x 2 weeks, patient has been on prednisone, Tessalon and inhaler from her PCP and has been taken over-the-counter Tylenol Mucinex and Advil without relief. Pt is alwert and oriented x 4,speaks in full sentences, is drinking po but decreased solid intake  Daughter concerned patient is not able to receive dosing of albuterol requesting AeroChamber device  The history is provided by the patient. No language interpreter was used.    Past Medical History:  Diagnosis Date   A-fib (HCC)    Arthritis    Bell palsy 09/04/2014   Congestion of nasal sinus 02/14/2023   Cough with sputum 02/14/2023   GERD (gastroesophageal reflux disease)    Heart disease    Rheumatoid arthritis (HCC)    Rhinosinusitis 02/14/2023   Thyroid disease    Tremors of nervous system     Patient Active Problem List   Diagnosis Date Noted   Acute respiratory infection 04/20/2023   Pseudoaneurysm (HCC) 11/03/2021   Acute blood loss anemia 10/23/2021   Depression with anxiety 10/23/2021   Obesity with body mass index (BMI) of 30.0 to 39.9 10/23/2021   Compression fracture of body of thoracic vertebra_T8, chronic 10/23/2021   HTN (hypertension) 10/23/2021   Chronic diastolic CHF (congestive heart failure) (HCC) 10/23/2021   Poor mobility 08/11/2021   Use of cane as ambulatory aid 08/11/2021   Bilateral leg edema 08/11/2021   Neuropathy 08/11/2021   Bilateral flank pain 11/11/2020   Valvular heart disease 08/13/2019   Current use of long term anticoagulation 05/07/2019   Hypertension 04/26/2015   Dry mouth 04/26/2015   Hypokalemia 08/12/2014   Allergic rhinitis 08/04/2014   Anxiety 08/04/2014   Neuropathic  pain 08/04/2014   Atrial fibrillation (HCC) 08/04/2014   Avitaminosis D 08/04/2014   Gastroesophageal reflux disease without esophagitis 11/07/2013   Polypharmacy 08/19/2013   Osteoarthritis 06/11/2013   Rheumatoid arthritis (HCC) 06/11/2013   Hypercholesteremia 05/12/2009   Benign essential tremor 05/12/2009   Prediabetes 08/26/2008   Adult hypothyroidism 08/26/2008    Past Surgical History:  Procedure Laterality Date   BREAST BIOPSY Left 1988   Benign   BREAST BIOPSY Left 1987   Benign   BREAST EXCISIONAL BIOPSY     BROW LIFT Bilateral 05/23/2016   Procedure: BLEPHAROPLASTY upper eyelid; w/excess skin;  Surgeon: Imagene Riches, MD;  Location: [facility];  Service: Ophthalmology;  Laterality: Bilateral;   CARPAL TUNNEL RELEASE Bilateral    CHOLECYSTECTOMY     Dr. Lemar Livings   COLONOSCOPY  2006   JOINT REPLACEMENT Bilateral    knees   REPLACEMENT TOTAL KNEE Left 2005   REPLACEMENT TOTAL KNEE Right 1998    OB History     Gravida  2   Para  2   Term      Preterm      AB      Living  2      SAB      IAB      Ectopic      Multiple      Live Births           Obstetric Comments  1st Menstrual Cycle:  14 1st Pregnancy:  26          Home Medications    Prior to Admission medications   Medication Sig Start Date End Date Taking? Authorizing Provider  albuterol (VENTOLIN HFA) 108 (90 Base) MCG/ACT inhaler Inhale 1-2 puffs into the lungs every 4 (four) hours as needed for wheezing or shortness of breath. 04/13/23  Yes Danelle Berry, PA-C  cetirizine (ZYRTEC) 10 MG tablet Take 10 mg by mouth daily as needed.   Yes [provider]  cholecalciferol (VITAMIN D) 400 units TABS tablet Take 400 Units by mouth daily.   Yes [provider]  conjugated estrogens (PREMARIN) vaginal cream Apply 0.5mg  (pea-sized amount)  just inside the vaginal introitus with a finger-tip on  Monday, Wednesday and Friday nights. 12/28/20  Yes McGowan, Carollee Herter A,  PA-C  doxycycline (VIBRAMYCIN) 100 MG capsule Take 1 capsule (100 mg total) by mouth 2 (two) times daily for 7 days. 04/20/23 04/27/23 Yes Jamonta Goerner, Para March, NP  EPINEPHrine 0.3 mg/0.3 mL IJ SOAJ injection Inject 0.3 mLs into the skin once as needed for anaphylaxis. 11/03/17  Yes [provider]  folic acid (FOLVITE) 1 MG tablet Take 1 mg by mouth daily.   Yes [provider]  gabapentin (NEURONTIN) 100 MG capsule Take 1 capsule (100 mg total) by mouth 2 (two) times daily. 02/09/23  Yes Bacigalupo, Marzella Schlein, MD  hydrochlorothiazide (HYDRODIURIL) 25 MG tablet Take 25 mg by mouth daily. 08/23/21  Yes [provider]  hydrocortisone cream 1 % Apply 1 application  topically as needed for itching.   Yes [provider]  ketoconazole (NIZORAL) 2 % cream Apply 1 application topically daily. 04/20/21  Yes Bacigalupo, Marzella Schlein, MD  levothyroxine (SYNTHROID) 125 MCG tablet Take 1 tablet (125 mcg total) by mouth daily. 02/09/23  Yes Erasmo Downer, MD  methotrexate (RHEUMATREX) 2.5 MG tablet Take 20 mg by mouth every Thursday. 10/25/13  Yes [provider]  NYSTATIN powder APPLY TOPICALLY TO SKIN FOLDS THREE TIMES DAILY 08/09/22  Yes Jacky Kindle, FNP  Omega-3 Fatty Acids (FISH OIL) 1000 MG CAPS Take 1 capsule by mouth daily.   Yes [provider]  omeprazole (PRILOSEC) 20 MG capsule TAKE 1 CAPSULE BY MOUTH TWICE  DAILY BEFORE MEALS 08/28/22  Yes Bacigalupo, Marzella Schlein, MD  potassium chloride SA (K-DUR,KLOR-CON) 20 MEQ tablet Take 1 tablet by mouth  daily 03/26/15  Yes Lorie Phenix, MD  propranolol (INDERAL) 40 MG tablet 80 mg in the am and 40 mg at night 03/06/19  Yes [provider]  Red Yeast Rice 600 MG CAPS Take 1 capsule by mouth daily.   Yes [provider]  rivaroxaban (XARELTO) 20 MG TABS tablet Take 1 tablet (20 mg total) by mouth daily with supper. Please report any side effects to R leg following starting including worsening of  bruise, new/changed edema, color variation or temperature change. 11/21/21  Yes Merita Norton T, FNP  sertraline (ZOLOFT) 50 MG tablet TAKE 3 TABLETS BY MOUTH DAILY 10/30/22  Yes Bacigalupo, Marzella Schlein, MD  Spacer/Aero-Holding Chambers (AEROCHAMBER HOLDING CHAMBER) DEVI 2 puffs by Does not apply route every 4 (four) hours as needed for up to 7 days (use with albuterol inhaler). 04/20/23 04/27/23 Yes Laquentin Loudermilk, Para March, NP  azelastine (ASTELIN) 0.1 % nasal spray Place 2 sprays into both nostrils 2 (two) times daily. Use in each nostril as directed 02/14/23   Sallee Provencal, FNP  benzonatate (TESSALON) 100 MG capsule Take 1-2 capsules (100-200 mg  total) by mouth 3 (three) times daily as needed for cough. 04/13/23   Danelle Berry, PA-C  guaiFENesin 200 MG tablet Take 1 tablet (200 mg total) by mouth every 4 (four) hours as needed for cough or to loosen phlegm. 02/14/23   Sallee Provencal, FNP  predniSONE (DELTASONE) 20 MG tablet 2 tabs poqday 1-3, 1 tabs poqday 4-6 04/13/23   Danelle Berry, PA-C    Family History Family History  Problem Relation Age of Onset   Stroke Mother    Hypertension Mother    Heart disease Father    Arthritis Father    Parkinson's disease Sister    Parkinson's disease Brother    Bladder Cancer Brother    Breast cancer Neg Hx    Kidney cancer Neg Hx     Social History Social History   Tobacco Use   Smoking status: Never   Smokeless tobacco: Never  Vaping Use   Vaping status: Never Used  Substance Use Topics   Alcohol use: No   Drug use: No     Allergies   Bactrim [sulfamethoxazole-trimethoprim], Macrobid [nitrofurantoin macrocrystal], Mirabegron, Oxybutynin, and Penicillins   Review of Systems Review of Systems  Constitutional:  Positive for fever.  HENT:  Positive for congestion.   Respiratory:  Positive for cough and wheezing.   All other systems reviewed and are negative.    Physical Exam Triage Vital Signs ED Triage Vitals  Encounter Vitals Group      BP 04/20/23 1139 (!) 162/81     Systolic BP Percentile --      Diastolic BP Percentile --      Pulse Rate 04/20/23 1139 69     Resp 04/20/23 1139 (!) 25     Temp 04/20/23 1139 98.4 F (36.9 C)     Temp Source 04/20/23 1139 Oral     SpO2 04/20/23 1139 96 %     Weight 04/20/23 1140 212 lb (96.2 kg)     Height 04/20/23 1140 5\' 6"  (1.676 m)     Head Circumference --      Peak Flow --      Pain Score --      Pain Loc --      Pain Education --      Exclude from Growth Chart --    No data found.  Updated Vital Signs BP (!) 162/81 (BP Location: Left Arm)   Pulse 69   Temp 98.4 F (36.9 C) (Oral)   Resp (!) 25   Ht 5\' 6"  (1.676 m)   Wt 212 lb (96.2 kg)   SpO2 96%   BMI 34.22 kg/m   Visual Acuity Right Eye Distance:   Left Eye Distance:   Bilateral Distance:    Right Eye Near:   Left Eye Near:    Bilateral Near:     Physical Exam Vitals and nursing note reviewed.  Constitutional:      General: She is not in acute distress.    Appearance: She is well-developed.  HENT:     Head: Normocephalic.     Right Ear: Tympanic membrane is retracted.     Left Ear: Tympanic membrane is retracted.     Nose: Congestion present.     Mouth/Throat:     Lips: Pink.     Mouth: Mucous membranes are moist.     Pharynx: Oropharynx is clear.  Eyes:     General: Lids are normal.     Conjunctiva/sclera: Conjunctivae normal.     Pupils:  Pupils are equal, round, and reactive to light.  Neck:     Trachea: No tracheal deviation.  Cardiovascular:     Rate and Rhythm: Normal rate and regular rhythm.     Pulses: Normal pulses.     Heart sounds: Normal heart sounds. No murmur heard. Pulmonary:     Effort: Pulmonary effort is normal. Tachypnea present.     Breath sounds: Normal air entry. Examination of the right-lower field reveals decreased breath sounds. Examination of the left-lower field reveals decreased breath sounds. Decreased breath sounds present.  Abdominal:     General: Bowel  sounds are normal.     Palpations: Abdomen is soft.     Tenderness: There is no abdominal tenderness.  Musculoskeletal:        General: Normal range of motion.     Cervical back: Normal range of motion.  Lymphadenopathy:     Cervical: No cervical adenopathy.  Skin:    General: Skin is warm and dry.     Findings: No rash.  Neurological:     General: No focal deficit present.     Mental Status: She is alert and oriented to person, place, and time.     GCS: GCS eye subscore is 4. GCS verbal subscore is 5. GCS motor subscore is 6.  Psychiatric:        Attention and Perception: Attention normal.        Mood and Affect: Mood normal.        Speech: Speech normal.        Behavior: Behavior normal. Behavior is cooperative.      UC Treatments / Results  Labs (all labs ordered are listed, but only abnormal results are displayed) Labs Reviewed - No data to display  EKG   Radiology No results found.  Procedures Procedures (including critical care time)  Medications Ordered in UC Medications - No data to display  Initial Impression / Assessment and Plan / UC Course  I have reviewed the triage vital signs and the nursing notes.  Pertinent labs & imaging results that were available during my care of the patient were reviewed by me and considered in my medical decision making (see chart for details).  Clinical Course as of 04/20/23 1254  Fri Apr 20, 2023  1215 No xray available today at this location, will treat with doxycycline to cover sinusitis and pneumonia. Pt and daughter both verbalized understanding to this provider [JD]    Clinical Course User Index [JD] Charlotte Brafford, Para March, NP   Discussed exam findings and plan of care with patient, strict go to ER precautions given.   Patient verbalized understanding to this provider.  Ddx: Acute respiratory infection, pneumonia, sinusitis, allergies Final Clinical Impressions(s) / UC Diagnoses   Final diagnoses:  Acute respiratory  infection     Discharge Instructions      Take antibiotic as directed, may take 2-3 days to see vast improvement, if she has worsening symptoms(shortness of breath, chest pain, palpitations, worsening symptoms) go to the emergency room for further evaluation  Scripted aerochamber to go with inhaler    ED Prescriptions     Medication Sig Dispense Auth. Provider   Spacer/Aero-Holding Chambers (AEROCHAMBER HOLDING CHAMBER) DEVI 2 puffs by Does not apply route every 4 (four) hours as needed for up to 7 days (use with albuterol inhaler). 1 each Urho Rio, Para March, NP   doxycycline (VIBRAMYCIN) 100 MG capsule Take 1 capsule (100 mg total) by mouth 2 (two) times daily for 7 days. 14  capsule Elyza Whitt, Para March, NP      PDMP not reviewed this encounter.   Clancy Gourd, NP 04/20/23 1254

## 2023-04-20 NOTE — Discharge Instructions (Signed)
Take antibiotic as directed, may take 2-3 days to see vast improvement, if she has worsening symptoms(shortness of breath, chest pain, palpitations, worsening symptoms) go to the emergency room for further evaluation  Scripted aerochamber to go with inhaler

## 2023-05-14 ENCOUNTER — Ambulatory Visit: Payer: Self-pay | Admitting: Family Medicine

## 2023-05-14 NOTE — Telephone Encounter (Signed)
 Copied from CRM [id number]. Topic: Clinical - Red Word Triage >> May 14, 2023  3:59 PM Glenda Williams wrote: Red Word that prompted transfer to Nurse Triage: The patient has a worsening cough with off and on discolored mucus. She was seen on 2/7 in office and 2/14 in an Urgent Care and has not gotten better.  Chief Complaint: Cough Symptoms: Congestion Frequency: A few months Pertinent Negatives: Patient denies SOB Disposition: [] ED /[] Urgent Care (no appt availability in office) / [x] Appointment(In office/virtual)/ []  [facility]/ [] Home Care/ [] Refused Recommended Disposition /[] [facility]/ []  Follow-up with PCP Additional Notes: Patient called in to report an ongoing cough and congestion that has been present since Thanksgiving. Patient stated the symptoms have improved, but have not gone away. Patient was seen on 04/13/23 and 04/20/23. Patient stated she has finished taking the prednisone and tessalon. Patient denied SOB, wheezing and chest pain at this time. This RN advised patient to see a provider within 3 days, per protocol. This RN scheduled patient with PCP for Thursday of this week. This RN advised patient to call back if symptoms worsen. Patient complied.   Reason for Disposition  Cough has been present for > 3 weeks  Answer Assessment - Initial Assessment Questions 1. ONSET: "When did the cough begin?"      States cough began around Thanksgiving, improved, but has not gone away 2. SEVERITY: "How bad is the cough today?"      Nagging and annoying during the day, gets worse at night  3. SPUTUM: "Describe the color of your sputum" (none, dry cough; clear, white, yellow, green)     Clear, turned green and going back to clear 4. HEMOPTYSIS: "Are you coughing up any blood?" If so ask: "How much?" (flecks, streaks, tablespoons, etc.)     Denies 5. DIFFICULTY BREATHING: "Are you having difficulty breathing?" If Yes, ask: "How bad is it?" (e.g., mild, moderate, severe)    -  MILD: No SOB at rest, mild SOB with walking, speaks normally in sentences, can lie down, no retractions, pulse < 100.    - MODERATE: SOB at rest, SOB with minimal exertion and prefers to sit, cannot lie down flat, speaks in phrases, mild retractions, audible wheezing, pulse 100-120.    - SEVERE: Very SOB at rest, speaks in single words, struggling to breathe, sitting hunched forward, retractions, pulse > 120      Denies SOB 6. FEVER: "Do you have a fever?" If Yes, ask: "What is your temperature, how was it measured, and when did it start?"     Denies 7. CARDIAC HISTORY: "Do you have any history of heart disease?" (e.g., heart attack, congestive heart failure)      States she has Afib 8. LUNG HISTORY: "Do you have any history of lung disease?"  (e.g., pulmonary embolus, asthma, emphysema)     Denies 10. OTHER SYMPTOMS: "Do you have any other symptoms?" (e.g., runny nose, wheezing, chest pain)     Denies wheezing, denies chest pain  Protocols used: Cough - Acute Productive-A-AH

## 2023-05-14 NOTE — Telephone Encounter (Signed)
 Noted.

## 2023-05-17 ENCOUNTER — Encounter: Payer: Self-pay | Admitting: Family Medicine

## 2023-05-17 ENCOUNTER — Ambulatory Visit (INDEPENDENT_AMBULATORY_CARE_PROVIDER_SITE_OTHER): Admitting: Family Medicine

## 2023-05-17 VITALS — BP 132/49 | HR 65 | Ht 66.0 in | Wt 212.0 lb

## 2023-05-17 DIAGNOSIS — J301 Allergic rhinitis due to pollen: Secondary | ICD-10-CM

## 2023-05-17 DIAGNOSIS — Z7409 Other reduced mobility: Secondary | ICD-10-CM | POA: Diagnosis not present

## 2023-05-17 DIAGNOSIS — Z79899 Other long term (current) drug therapy: Secondary | ICD-10-CM | POA: Diagnosis not present

## 2023-05-17 DIAGNOSIS — M069 Rheumatoid arthritis, unspecified: Secondary | ICD-10-CM | POA: Diagnosis not present

## 2023-05-17 DIAGNOSIS — M792 Neuralgia and neuritis, unspecified: Secondary | ICD-10-CM

## 2023-05-17 DIAGNOSIS — R052 Subacute cough: Secondary | ICD-10-CM

## 2023-05-17 DIAGNOSIS — N3946 Mixed incontinence: Secondary | ICD-10-CM | POA: Diagnosis not present

## 2023-05-17 DIAGNOSIS — Z789 Other specified health status: Secondary | ICD-10-CM | POA: Diagnosis not present

## 2023-05-17 NOTE — Progress Notes (Signed)
 Acute visit   Patient: Glenda Williams   DOB: [birth year]   83 y.o. Female  MRN: [id number] PCP: Erasmo Downer, MD   Chief Complaint  Patient presents with   Cough    Cough a mixture of dry and productive. Reports being better than it originally was but causing trouble with sleep and  Son would like to see about home physical therapy to help with strength as she has gotten weaker as well as home health assistance reports she is ambulatory but needing more assistance.    Subjective    Discussed the use of AI scribe software for clinical note transcription with the patient, who gave verbal consent to proceed.  History of Present Illness   The patient, with a history of recurrent urinary tract infections and blood in urine, presents with a persistent cough that has been ongoing for several months. The cough is described as sometimes dry and sometimes moist, with the color of the sputum varying from clear to green. The patient has been treated with antibiotics and steroids, which have improved the symptoms but not completely resolved the cough. The patient also reports feeling wheezy at times.  In addition to the cough, the patient has been experiencing decreased mobility and increased incontinence. The patient's family member reports that the patient struggles with activities of daily living, such as bathing, which can take up to two hours due to the patient's OCD tendencies. The patient also sleeps in a chair due to pain and incontinence issues. The patient has been using liners for incontinence but is hesitant to use adult diapers due to concerns about cleanliness and potential infection.  The patient also reports pain, which is exacerbated by the fact that she has not been able to receive her regular infusions for rheumatoid arthritis due to the persistent cough. The patient is hesitant to take over-the-counter pain medications such as ibuprofen or Tylenol, but acknowledges that these  medications can provide some relief.        Review of Systems  Objective    BP (!) 132/49 (BP Location: Left Arm, Patient Position: Sitting, Cuff Size: Large)   Pulse 65   Ht 5\' 6"  (1.676 m)   Wt 212 lb (96.2 kg)   SpO2 97%   BMI 34.22 kg/m  Physical Exam Vitals reviewed.  Constitutional:      General: She is not in acute distress.    Appearance: Normal appearance. She is well-developed. She is not diaphoretic.  HENT:     Head: Normocephalic and atraumatic.     Right Ear: Tympanic membrane, ear canal and external ear normal.     Left Ear: Tympanic membrane, ear canal and external ear normal.     Nose: Congestion present.     Mouth/Throat:     Mouth: Mucous membranes are moist.     Pharynx: Oropharynx is clear. No oropharyngeal exudate.  Eyes:     General: No scleral icterus.    Conjunctiva/sclera: Conjunctivae normal.     Pupils: Pupils are equal, round, and reactive to light.  Neck:     Thyroid: No thyromegaly.  Cardiovascular:     Rate and Rhythm: Normal rate and regular rhythm.     Heart sounds: Normal heart sounds.  Pulmonary:     Effort: Pulmonary effort is normal. No respiratory distress.     Breath sounds: Normal breath sounds. No wheezing, rhonchi or rales.  Abdominal:     General: There is no distension.  Palpations: Abdomen is soft.     Tenderness: There is no abdominal tenderness.  Musculoskeletal:     Cervical back: Neck supple.  Lymphadenopathy:     Cervical: No cervical adenopathy.  Skin:    General: Skin is warm and dry.  Neurological:     Mental Status: She is alert. Mental status is at baseline.  Psychiatric:        Mood and Affect: Mood normal.        Behavior: Behavior normal.       No results found for any visits on 05/17/23.  Assessment & Plan     Problem List Items Addressed This Visit       Respiratory   Allergic rhinitis     Musculoskeletal and Integument   Rheumatoid arthritis (HCC)     Other   Neuropathic pain    Relevant Orders   Ambulatory referral to Home Health   AMB Referral VBCI Care Management   Polypharmacy   Relevant Orders   Ambulatory referral to Home Health   AMB Referral VBCI Care Management   Poor mobility - Primary   Relevant Orders   Ambulatory referral to Home Health   AMB Referral VBCI Care Management   Other Visit Diagnoses       Decreased activities of daily living (ADL)       Relevant Orders   Ambulatory referral to Home Health   AMB Referral VBCI Care Management     Mixed stress and urge urinary incontinence       Relevant Orders   Ambulatory referral to Urology   Ambulatory referral to Home Health   AMB Referral VBCI Care Management     Subacute cough               Chronic Cough Chronic cough persisting since early February with fluctuating symptoms, including dry and moist cough, and sputum varying from clear to green. Previous treatment with antibiotics, steroids, and inhaler provided some relief. Current symptoms suggest a post-viral cough exacerbated by severe seasonal allergies. - Start Zyrtec daily for allergy management - Use Flonase nasal spray as needed for nasal symptoms - Proceed with scheduled infusion as no current infection is suspected  Rheumatoid Arthritis Rheumatoid arthritis significantly impacts daily activities and pain management. Recent inability to receive infusions due to illness has exacerbated symptoms. She is cautious about using NSAIDs due to blood thinner use. Tylenol is recommended for pain management as it does not interfere with her blood thinner. - Proceed with scheduled infusion - Use Tylenol for pain management as needed - Avoid NSAIDs such as Aleve, Advil, or Motrin  Urinary Incontinence Urinary incontinence significantly impacts daily life, necessitating incontinence products. Previous urology evaluation in 2022 focused on recurrent UTIs and hematuria rather than incontinence. She is open to revisiting urology for further  evaluation and management. - Refer to urology for further evaluation and management of incontinence - Discuss use of incontinence products and Medicare coverage with case manager  Home Health Needs Decreased mobility and challenges with daily activities necessitate assistance with home health needs. Family seeks to utilize Medicare for support and adaptations at home. Home health services and a case manager will assist in navigating Medicare services and home adaptations. - Initiate home health services including nursing aide, physical therapy, and occupational therapy - Engage a case manager to assist with navigating Medicare services and home adaptations  General Health Maintenance Emphasis on maintaining hydration and addressing OCD-related cleanliness concerns. Family is actively involved in ensuring  adequate hydration. Concerns about cleanliness and incontinence products are noted, with reassurance provided regarding the use of modern incontinence products. - Encourage regular hydration - Discuss OCD-related cleanliness concerns with case manager       No orders of the defined types were placed in this encounter.    Return in about 2 months (around 07/17/2023) for chronic disease f/u.      Shirlee Latch, MD  [facility] [phone] (phone) [phone] (fax)  [facility]

## 2023-05-18 ENCOUNTER — Telehealth: Payer: Self-pay

## 2023-05-18 NOTE — Progress Notes (Signed)
 Complex Care Management Note  Care Guide Note 05/18/2023 Name: Glenda Williams MRN: [id number] DOB: 02/16/1941  Glenda Williams is a 83 y.o. year old female who sees Bacigalupo, Marzella Schlein, MD for primary care. I reached out to [name] by phone today to offer complex care management services.  Ms. Kalisz was given information about Complex Care Management services today including:   The Complex Care Management services include support from the care team which includes your Nurse Care Manager, Clinical Social Worker, or Pharmacist.  The Complex Care Management team is here to help remove barriers to the health concerns and goals most important to you. Complex Care Management services are voluntary, and the patient may decline or stop services at any time by request to their care team member.   Complex Care Management Consent Status: Patient agreed to services and verbal consent obtained.   Follow up plan:  Telephone appointment with complex care management team member scheduled for:  05/28/2023  Encounter Outcome:  Patient Scheduled  Penne Lash , RMA       [place], [facility] Guide  Direct Dial: [phone]  Website: [email or web address]

## 2023-05-18 NOTE — Progress Notes (Signed)
 Complex Care Management Note Care Guide Note  05/18/2023 Name: Glenda Williams MRN: [id number] DOB: [birth year]   Complex Care Management Outreach Attempts: An unsuccessful telephone outreach was attempted today to offer the patient information about available complex care management services.  Follow Up Plan:  Additional outreach attempts will be made to offer the patient complex care management information and services.   Encounter Outcome:  No Answer     Penne Lash , RMA     [place]  [facility], [facility] Guide  Direct Dial: [phone]  Website: [email or web address]

## 2023-05-21 ENCOUNTER — Ambulatory Visit: Payer: Self-pay | Admitting: Family Medicine

## 2023-05-21 DIAGNOSIS — G629 Polyneuropathy, unspecified: Secondary | ICD-10-CM | POA: Diagnosis not present

## 2023-05-21 DIAGNOSIS — J301 Allergic rhinitis due to pollen: Secondary | ICD-10-CM | POA: Diagnosis not present

## 2023-05-21 DIAGNOSIS — K219 Gastro-esophageal reflux disease without esophagitis: Secondary | ICD-10-CM | POA: Diagnosis not present

## 2023-05-21 DIAGNOSIS — F418 Other specified anxiety disorders: Secondary | ICD-10-CM | POA: Diagnosis not present

## 2023-05-21 DIAGNOSIS — Z7901 Long term (current) use of anticoagulants: Secondary | ICD-10-CM | POA: Diagnosis not present

## 2023-05-21 DIAGNOSIS — I4891 Unspecified atrial fibrillation: Secondary | ICD-10-CM | POA: Diagnosis not present

## 2023-05-21 DIAGNOSIS — G250 Essential tremor: Secondary | ICD-10-CM | POA: Diagnosis not present

## 2023-05-21 DIAGNOSIS — I110 Hypertensive heart disease with heart failure: Secondary | ICD-10-CM | POA: Diagnosis not present

## 2023-05-21 DIAGNOSIS — M1990 Unspecified osteoarthritis, unspecified site: Secondary | ICD-10-CM | POA: Diagnosis not present

## 2023-05-21 DIAGNOSIS — Z6834 Body mass index (BMI) 34.0-34.9, adult: Secondary | ICD-10-CM | POA: Diagnosis not present

## 2023-05-21 DIAGNOSIS — F429 Obsessive-compulsive disorder, unspecified: Secondary | ICD-10-CM | POA: Diagnosis not present

## 2023-05-21 DIAGNOSIS — M792 Neuralgia and neuritis, unspecified: Secondary | ICD-10-CM | POA: Diagnosis not present

## 2023-05-21 DIAGNOSIS — I5032 Chronic diastolic (congestive) heart failure: Secondary | ICD-10-CM | POA: Diagnosis not present

## 2023-05-21 DIAGNOSIS — R052 Subacute cough: Secondary | ICD-10-CM | POA: Diagnosis not present

## 2023-05-21 DIAGNOSIS — Z7409 Other reduced mobility: Secondary | ICD-10-CM | POA: Diagnosis not present

## 2023-05-21 DIAGNOSIS — Z87440 Personal history of urinary (tract) infections: Secondary | ICD-10-CM | POA: Diagnosis not present

## 2023-05-21 DIAGNOSIS — J22 Unspecified acute lower respiratory infection: Secondary | ICD-10-CM | POA: Diagnosis not present

## 2023-05-21 DIAGNOSIS — Z9181 History of falling: Secondary | ICD-10-CM | POA: Diagnosis not present

## 2023-05-21 DIAGNOSIS — E669 Obesity, unspecified: Secondary | ICD-10-CM | POA: Diagnosis not present

## 2023-05-21 DIAGNOSIS — N3946 Mixed incontinence: Secondary | ICD-10-CM | POA: Diagnosis not present

## 2023-05-21 DIAGNOSIS — M0579 Rheumatoid arthritis with rheumatoid factor of multiple sites without organ or systems involvement: Secondary | ICD-10-CM | POA: Diagnosis not present

## 2023-05-21 DIAGNOSIS — E559 Vitamin D deficiency, unspecified: Secondary | ICD-10-CM | POA: Diagnosis not present

## 2023-05-21 NOTE — Telephone Encounter (Signed)
 Fall on Saturday Symptoms: Bruising on left side of face, redness under eye Pertinent Negatives: Patient denies pain, new onset weakness/ dizziness, losing consciousness  Pertinent Negatives: [x] Home Care  Additional Notes: Spoke with pt's home physical therapist, Sunday Corn. Pt had a fall on Saturday and hit the left side of face. Pt has bruising but denies pain. This RN educated pt on home care, new-worsening symptoms, when to call back/seek emergent care. Pt verbalized understanding and agrees to plan.   Copied from CRM [id number]. Topic: Clinical - Red Word Triage >> May 21, 2023  3:20 PM Franchot Heidelberg wrote: Red Word that prompted transfer to Nurse Triage: Pt fell Saturday. bruise above eye and below Reason for Disposition  Small cut (scratch) or abrasion (scrape) is also present  Answer Assessment - Initial Assessment Questions 1. MECHANISM: "How did the fall happen?"     Getting up from commode and lost balance 3. ONSET: "When did the fall happen?" (e.g., minutes, hours, or days ago)     Saturday 3. LOCATION: "What part of the body hit the ground?" (e.g., back, buttocks, head, hips, knees, hands, head, stomach)     Left side of head, little bump and bruising, previous bleeding and cut that is closed; redness under eye;  4. PAIN: "Is there any pain?" If Yes, ask: "How bad is the pain?" (e.g., Scale 1-10; or mild,  moderate, severe)   - NONE (0): No pain   - MILD (1-3): Doesn't interfere with normal activities    - MODERATE (4-7): Interferes with normal activities or awakens from sleep    - SEVERE (8-10): Excruciating pain, unable to do any normal activities      Denies  Protocols used: Falls and [facility]

## 2023-05-28 ENCOUNTER — Ambulatory Visit: Payer: Self-pay

## 2023-05-28 NOTE — Patient Outreach (Signed)
 Care Coordination   Initial Visit Note   05/28/2023 Name: Glenda Williams MRN: [id number] DOB: September 22, 1940  Glenda Williams is a 83 y.o. year old female who sees Bacigalupo, Marzella Schlein, MD for primary care. I  spoke with son/ designated party release Glenda Williams today.   What matters to the patients health and wellness today?  Son states he is concerned about patients mobility and wants to make sure patient has necessary resources for her care. He states patient lives with her spouse and daughter and has an overall strong family support system. He states patient had a home health visit with PT on last week. He states he was informed she would also have OT however he has not been contacted regarding a start date.  Son states patient has a cane/ walker for ambulation. He states patient has fallen in the past.  Son states he would like to know if there are any resources or coverage for incontinent supplies.  He states patient is able to bath herself however it is becoming more difficult.  Son states patients husband is able to assist some with patients care however he is older to.  Son states patients daughter fills pill box. Request RN case manager to speak with daughter to review medications. Asked if daughter can be called around 4-4:30 pm when she gets off work.   This RN case manager contacted [facility] home [facility] and spoke with Davita to request OT start of care date. Davita stated OT/PT make their own schedule and will call the day before patients scheduled visit appointment.  Davita states OT can evaluate patient to determine if HHA resource is needed.    Goals Addressed             This Visit's Progress    Patient / family members will work with CCM team to gain knowlege and help with management and resources for health conditions.       Interventions Today    Flowsheet Row Most Recent Value  Chronic Disease   Chronic disease during today's visit Other  [RA, poor mobility, falls]  General  Interventions   General Interventions Discussed/Reviewed General Interventions Discussed, Doctor Visits, Durable Medical Equipment (DME)  [evaluation of current treatment plan for listed health conditions and patients adherence to plan as established by provider.  Assessed for needs/ concerns.]  Doctor Visits Discussed/Reviewed Doctor Visits Discussed  [reviewed upcoming provider visits. Confirmed patient has transportation to appointment. Advised to keep follow up visits with providers.]  Exercise Interventions   Exercise Discussed/Reviewed Physical Activity  [assessed patients current mobiity status. Assessed for in home services. [facility] called to determine OT start of care date and inquire of evaluation for home health aid.]  Education Interventions   Education Provided Provided Printed Education, Provided Education  [education article on falls and rheumatoid arthritis sent to patient. Dancing [employer] provider on after visit summary. Advised to call insurance plan customer service regarding coverage of incontinent supplies.]  Pharmacy Interventions   Pharmacy Dicussed/Reviewed Pharmacy Topics Discussed  [Unable to complete medication review with son.  Rescheduled patient within 2 days to attempt medication review with patients daughter as recommended by son.]  Safety Interventions   Safety Discussed/Reviewed Fall Risk  [fAssessed for falls. Fall prevention discussed. Confirmed patient has and uses ambulatory device.]                  SDOH assessments and interventions completed:  Yes  SDOH Interventions Today  Flowsheet Row Most Recent Value  SDOH Interventions   Food Insecurity Interventions Intervention Not Indicated  Housing Interventions Intervention Not Indicated  Transportation Interventions Intervention Not Indicated  Utilities Interventions Intervention Not Indicated        Care Coordination Interventions:  Yes, provided   Follow up plan:  Follow up call scheduled for 05/30/23 at 4 pm    Encounter Outcome:  Patient Visit Completed   George Ina RN, BSN, CCM [place]  [facility], Population Health Case Manager Phone: [phone]

## 2023-05-28 NOTE — Patient Instructions (Signed)
 Visit Information  Thank you for taking time to visit with me today. Please don't hesitate to contact me if I can be of assistance to you.   Following are the goals we discussed today:   Goals Addressed             This Visit's Progress    Patient / family members will work with CCM team to gain knowlege and help with management and resources for health conditions.       Interventions Today    Flowsheet Row Most Recent Value  Chronic Disease   Chronic disease during today's visit Other  [RA, poor mobility, falls]  General Interventions   General Interventions Discussed/Reviewed General Interventions Discussed, Doctor Visits, Durable Medical Equipment (DME)  [evaluation of current treatment plan for listed health conditions and patients adherence to plan as established by provider.  Assessed for needs/ concerns.]  Doctor Visits Discussed/Reviewed Doctor Visits Discussed  [reviewed upcoming provider visits. Confirmed patient has transportation to appointment. Advised to keep follow up visits with providers.]  Exercise Interventions   Exercise Discussed/Reviewed Physical Activity  [assessed patients current mobiity status. Assessed for in home services. [facility] called to determine OT start of care date and inquire of evaluation for home health aid.]  Education Interventions   Education Provided Provided Printed Education, Provided Education  [education article on falls and rheumatoid arthritis sent to patient. [employer]ancing [employer] provider on after visit summary. Advised to call insurance plan customer service regarding coverage of incontinent supplies.]  Pharmacy Interventions   Pharmacy Dicussed/Reviewed Pharmacy Topics Discussed  [Unable to complete medication review with son.  Rescheduled patient within 2 days to attempt medication review with patients daughter as recommended by son.]  Safety Interventions   Safety Discussed/Reviewed Fall Risk  [fAssessed for falls.  Fall prevention discussed. Confirmed patient has and uses ambulatory device.]                  Our next appointment is by telephone on 05/30/23 at 4 pm  Please call the care guide team at [phone] if you need to cancel or reschedule your appointment.   If you are experiencing a Mental Health or Behavioral Health Crisis or need someone to talk to, please call the Suicide and Crisis Lifeline: 988 call 1-800-273-TALK (toll free, 24 hour hotline)  Patient verbalizes understanding of instructions and care plan provided today and agrees to view in MyChart. Active MyChart status and patient understanding of how to access instructions and care plan via MyChart confirmed with patient.     Glenda Ina RN, BSN, CCM [place]  [facility], Population Health Case Manager Phone: [phone]  Understanding Your Risk for Falls Millions of people have serious injuries from falls each year. It is important to understand your risk of falling. Talk with your health care provider about your risk and what you can do to lower it. If you do have a serious fall, make sure to tell your provider. Falling once raises your risk of falling again. How can falls affect me? Serious injuries from falls are common. These include: Broken bones, such as hip fractures. Head injuries, such as traumatic brain injuries (TBI) or concussions. A fear of falling can cause you to avoid activities and stay at home. This can make your muscles weaker and raise your risk for a fall. What can increase my risk? There are a number of risk factors that increase your risk for falling. The more risk factors you have, the  higher your risk of falling. Serious injuries from a fall happen most often to people who are older than 82 years old. Teenagers and young adults ages 66-29 are also at higher risk. Common risk factors include: Weakness in the lower body. Being generally weak or confused due to long-term  (chronic) illness. Dizziness or balance problems. Poor vision. Medicines that cause dizziness or drowsiness. These may include: Medicines for your blood pressure, heart, anxiety, insomnia, or swelling (edema). Pain medicines. Muscle relaxants. Other risk factors include: Drinking alcohol. Having had a fall in the past. Having foot pain or wearing improper footwear. Working at a dangerous job. Having any of the following in your home: Tripping hazards, such as floor clutter or loose rugs. Poor lighting. Pets. Having dementia or memory loss. What actions can I take to lower my risk of falling?     Physical activity Stay physically fit. Do strength and balance exercises. Consider taking a regular class to build strength and balance. Yoga and tai chi are good options. Vision Have your eyes checked every year and your prescription for glasses or contacts updated as needed. Shoes and walking aids Wear non-skid shoes. Wear shoes that have rubber soles and low heels. Do not wear high heels. Do not walk around the house in socks or slippers. Use a cane or walker as told by your provider. Home safety Attach secure railings on both sides of your stairs. Install grab bars for your bathtub, shower, and toilet. Use a non-skid mat in your bathtub or shower. Attach bath mats securely with double-sided, non-slip rug tape. Use good lighting in all rooms. Keep a flashlight near your bed. Make sure there is a clear path from your bed to the bathroom. Use night-lights. Do not use throw rugs. Make sure all carpeting is taped or tacked down securely. Remove all clutter from walkways and stairways, including extension cords. Repair uneven or broken steps and floors. Avoid walking on icy or slippery surfaces. Walk on the grass instead of on icy or slick sidewalks. Use ice melter to get rid of ice on walkways in the winter. Use a cordless phone. Questions to ask your health care provider Can you help  me check my risk for a fall? Do any of my medicines make me more likely to fall? Should I take a vitamin D supplement? What exercises can I do to improve my strength and balance? Should I make an appointment to have my vision checked? Do I need a bone density test to check for weak bones (osteoporosis)? Would it help to use a cane or a walker? Where to find more information Centers for Disease Control and Prevention, STEADI: TonerPromos.no Community-Based Fall Prevention Programs: TonerPromos.no [employer] on Aging: BaseRingTones.pl Contact a health care provider if: You fall at home. You are afraid of falling at home. You feel weak, drowsy, or dizzy. This information is not intended to replace advice given to you by your health care provider. Make sure you discuss any questions you have with your health care provider. Document Revised: 10/24/2021 Document Reviewed: 10/24/2021 Elsevier Patient Education  2024 Elsevier Inc.   Rheumatoid Arthritis Rheumatoid arthritis (RA) is a long-term (chronic) disease. RA causes inflammation in your joints. Your joints may feel painful, stiff, swollen, and warm. RA may start slowly. It most often affects the small joints of the hands and feet. It can also affect other parts of the body. Symptoms of RA often come and go. There is no cure for RA, but medicines  can help your symptoms. What are the causes? RA is an autoimmune disease. This means that your body's defense system (immune system) attacks healthy parts of your body by mistake. The exact cause of RA is not known. What increases the risk? Being female. Having a family history of RA or other diseases like RA. Smoking. Being very overweight (obese). Being exposed to pollutants or chemicals. What are the signs or symptoms? Symptoms start slowly. They are often worse in the morning. The first symptom is often morning stiffness that lasts longer than 30 minutes. As RA gets worse, symptoms may  include: Pain, stiffness, swelling, warmth, and tenderness in joints on both sides of your body. Loss of energy. Not wanting to eat as much as normal. Weight loss. A low fever. Dry eyes and a dry mouth. Firm lumps that grow under your skin. Changes in the way your joints look or the way they work. Symptoms vary and they often come and go. Symptoms sometimes get worse for a period of time. These are called flares. How is this treated? Treatment may include: Taking good care of yourself. Be sure to rest as needed, eat a healthy diet, and exercise. Medicines. These may include: Pain relievers. Medicines to help with inflammation. Disease-modifying antirheumatic drugs (DMARDs). Medicines called biologic response modifiers. Physical therapy and occupational therapy. Surgery, if joint damage is very bad. Your doctor will work with you to find the best treatments. Follow these instructions at home: Managing pain, stiffness, and swelling If told, put heat on the affected area. Do this as often as told by your doctor. Use the heat source that your doctor recommends, such as a moist heat pack or a heating pad. Place a towel between your skin and the heat source. Leave the heat on for 20-30 minutes. Take off the heat if your skin turns bright red. This is very important. If you cannot feel pain, heat, or cold, you have a greater risk of getting burned.  Activity Return to your normal activities when your doctor says that it is safe. Rest when you have a flare. Exercise as told by your doctor. This can help your joints move better and get stronger. General instructions Take over-the-counter and prescription medicines only as told by your doctor. Keep all follow-up visits. Where to find more information [employer] of Rheumatology: rheumatology.org Arthritis Foundation: arthritis.org Contact a doctor if: You have a flare. You have a fever. You have problems because of your  medicines. Get help right away if: You have chest pain. You have trouble breathing. You get a hot, painful joint all of a sudden, and it is worse than your normal joint aches. These symptoms may be an emergency. Get help right away. Call 911. Do not wait to see if the symptoms will go away. Do not drive yourself to the hospital. Summary RA is a long-term disease. RA causes inflammation in your joints. Symptoms of RA start slowly. They are often worse in the morning. This information is not intended to replace advice given to you by your health care provider. Make sure you discuss any questions you have with your health care provider. Document Revised: 12/23/2020 Document Reviewed: 12/23/2020 Elsevier Patient Education  2024 [employer].

## 2023-05-29 DIAGNOSIS — R052 Subacute cough: Secondary | ICD-10-CM | POA: Diagnosis not present

## 2023-05-29 DIAGNOSIS — J301 Allergic rhinitis due to pollen: Secondary | ICD-10-CM | POA: Diagnosis not present

## 2023-05-29 DIAGNOSIS — M792 Neuralgia and neuritis, unspecified: Secondary | ICD-10-CM | POA: Diagnosis not present

## 2023-05-29 DIAGNOSIS — N3946 Mixed incontinence: Secondary | ICD-10-CM | POA: Diagnosis not present

## 2023-05-29 DIAGNOSIS — J22 Unspecified acute lower respiratory infection: Secondary | ICD-10-CM | POA: Diagnosis not present

## 2023-05-29 DIAGNOSIS — M0579 Rheumatoid arthritis with rheumatoid factor of multiple sites without organ or systems involvement: Secondary | ICD-10-CM | POA: Diagnosis not present

## 2023-05-30 ENCOUNTER — Ambulatory Visit: Payer: Self-pay

## 2023-05-30 DIAGNOSIS — J22 Unspecified acute lower respiratory infection: Secondary | ICD-10-CM | POA: Diagnosis not present

## 2023-05-30 DIAGNOSIS — M0579 Rheumatoid arthritis with rheumatoid factor of multiple sites without organ or systems involvement: Secondary | ICD-10-CM | POA: Diagnosis not present

## 2023-05-30 DIAGNOSIS — R052 Subacute cough: Secondary | ICD-10-CM | POA: Diagnosis not present

## 2023-05-30 DIAGNOSIS — N3946 Mixed incontinence: Secondary | ICD-10-CM | POA: Diagnosis not present

## 2023-05-30 DIAGNOSIS — J301 Allergic rhinitis due to pollen: Secondary | ICD-10-CM | POA: Diagnosis not present

## 2023-05-30 DIAGNOSIS — M792 Neuralgia and neuritis, unspecified: Secondary | ICD-10-CM | POA: Diagnosis not present

## 2023-05-30 NOTE — Patient Outreach (Signed)
 Care Coordination   Follow Up Visit Note   05/30/2023 Name: Glenda Williams MRN: [id number] DOB: February 13, 1941  Glenda Williams is a 83 y.o. year old female who sees Bacigalupo, Marzella Schlein, MD for primary care. I spoke with  [name] and daughter Raelyn Ensign by phone today. Patient gave verbal permission to speak with her daughter, Raelyn Ensign regarding her personal health information.   What matters to the patients health and wellness today?  Patient states she is doing well. Patient completed medication review.  She states she is very alert an cognizant of her medications and care.  Patient requested RNCM contact her daughter to provide follow up telephone visit appointment information.     Goals Addressed             This Visit's Progress    Patient / family members will work with CCM team to gain knowlege and help with management and resources for health conditions.       Interventions Today    Flowsheet Row Most Recent Value  Chronic Disease   Chronic disease during today's visit Other  [RA, poor mobility/ falls.]  General Interventions   General Interventions Discussed/Reviewed Community Resources  [Resource: Contact address and phone number for the [employer] DME provided to daughter Raelyn Ensign  Education Interventions   Education Provided Provided Education  [Advised to [employer] provider to discuss coverage of supplies and or DME. Name and contact phone number for this [facility] provider to daughter, Aram Beecham Sanders.]  Pharmacy Interventions   Pharmacy Dicussed/Reviewed Pharmacy Topics Discussed  [medications reviewed. Adherence to medications dicussed and advised.]  Advanced Directive Interventions   Advanced Directives Discussed/Reviewed Advanced Directives Discussed  [Confirmed patient has Advanced directive/ living will/ HPOA]              SDOH assessments and interventions completed:  No     Care Coordination Interventions:  Yes, provided    Follow up plan: Follow up call scheduled for 06/27/23 at 1:30 pm    Encounter Outcome:  Patient Visit Completed   George Ina RN, BSN, CCM [place]  [facility], Population Health Case Manager Phone: [phone]

## 2023-05-31 DIAGNOSIS — N3946 Mixed incontinence: Secondary | ICD-10-CM | POA: Diagnosis not present

## 2023-05-31 DIAGNOSIS — G529 Cranial nerve disorder, unspecified: Secondary | ICD-10-CM | POA: Diagnosis not present

## 2023-05-31 DIAGNOSIS — F429 Obsessive-compulsive disorder, unspecified: Secondary | ICD-10-CM | POA: Diagnosis not present

## 2023-05-31 DIAGNOSIS — M0579 Rheumatoid arthritis with rheumatoid factor of multiple sites without organ or systems involvement: Secondary | ICD-10-CM | POA: Diagnosis not present

## 2023-05-31 DIAGNOSIS — I5032 Chronic diastolic (congestive) heart failure: Secondary | ICD-10-CM | POA: Diagnosis not present

## 2023-05-31 DIAGNOSIS — G250 Essential tremor: Secondary | ICD-10-CM | POA: Diagnosis not present

## 2023-05-31 DIAGNOSIS — E559 Vitamin D deficiency, unspecified: Secondary | ICD-10-CM | POA: Diagnosis not present

## 2023-05-31 DIAGNOSIS — I110 Hypertensive heart disease with heart failure: Secondary | ICD-10-CM | POA: Diagnosis not present

## 2023-05-31 DIAGNOSIS — J22 Unspecified acute lower respiratory infection: Secondary | ICD-10-CM | POA: Diagnosis not present

## 2023-05-31 DIAGNOSIS — R052 Subacute cough: Secondary | ICD-10-CM | POA: Diagnosis not present

## 2023-05-31 DIAGNOSIS — J301 Allergic rhinitis due to pollen: Secondary | ICD-10-CM | POA: Diagnosis not present

## 2023-06-06 DIAGNOSIS — N3946 Mixed incontinence: Secondary | ICD-10-CM | POA: Diagnosis not present

## 2023-06-06 DIAGNOSIS — J22 Unspecified acute lower respiratory infection: Secondary | ICD-10-CM | POA: Diagnosis not present

## 2023-06-06 DIAGNOSIS — M792 Neuralgia and neuritis, unspecified: Secondary | ICD-10-CM | POA: Diagnosis not present

## 2023-06-06 DIAGNOSIS — R052 Subacute cough: Secondary | ICD-10-CM | POA: Diagnosis not present

## 2023-06-06 DIAGNOSIS — J301 Allergic rhinitis due to pollen: Secondary | ICD-10-CM | POA: Diagnosis not present

## 2023-06-06 DIAGNOSIS — M0579 Rheumatoid arthritis with rheumatoid factor of multiple sites without organ or systems involvement: Secondary | ICD-10-CM | POA: Diagnosis not present

## 2023-06-12 DIAGNOSIS — M0579 Rheumatoid arthritis with rheumatoid factor of multiple sites without organ or systems involvement: Secondary | ICD-10-CM | POA: Diagnosis not present

## 2023-06-12 DIAGNOSIS — R052 Subacute cough: Secondary | ICD-10-CM | POA: Diagnosis not present

## 2023-06-12 DIAGNOSIS — N3946 Mixed incontinence: Secondary | ICD-10-CM | POA: Diagnosis not present

## 2023-06-12 DIAGNOSIS — J22 Unspecified acute lower respiratory infection: Secondary | ICD-10-CM | POA: Diagnosis not present

## 2023-06-12 DIAGNOSIS — J301 Allergic rhinitis due to pollen: Secondary | ICD-10-CM | POA: Diagnosis not present

## 2023-06-12 DIAGNOSIS — M792 Neuralgia and neuritis, unspecified: Secondary | ICD-10-CM | POA: Diagnosis not present

## 2023-06-13 ENCOUNTER — Emergency Department
Admission: EM | Admit: 2023-06-13 | Discharge: 2023-06-13 | Disposition: A | Discharge status: Home / Self Care | Attending: Emergency Medicine | Admitting: Emergency Medicine

## 2023-06-13 ENCOUNTER — Emergency Department

## 2023-06-13 ENCOUNTER — Other Ambulatory Visit: Payer: Self-pay

## 2023-06-13 DIAGNOSIS — Z23 Encounter for immunization: Secondary | ICD-10-CM | POA: Diagnosis not present

## 2023-06-13 DIAGNOSIS — S0990XA Unspecified injury of head, initial encounter: Secondary | ICD-10-CM | POA: Diagnosis not present

## 2023-06-13 DIAGNOSIS — W01198A Fall on same level from slipping, tripping and stumbling with subsequent striking against other object, initial encounter: Secondary | ICD-10-CM | POA: Diagnosis not present

## 2023-06-13 DIAGNOSIS — S0181XA Laceration without foreign body of other part of head, initial encounter: Secondary | ICD-10-CM | POA: Diagnosis not present

## 2023-06-13 DIAGNOSIS — R220 Localized swelling, mass and lump, head: Secondary | ICD-10-CM | POA: Diagnosis not present

## 2023-06-13 MED ORDER — TETANUS-DIPHTH-ACELL PERTUSSIS 5-2.5-18.5 LF-MCG/0.5 IM SUSY
0.5000 mL | PREFILLED_SYRINGE | Freq: Once | INTRAMUSCULAR | Status: AC
  Administered 2023-06-13: 0.5 mL via INTRAMUSCULAR
  Filled 2023-06-13: qty 0.5

## 2023-06-13 MED ORDER — LIDOCAINE-EPINEPHRINE 2 %-1:100000 IJ SOLN
20.0000 mL | Freq: Once | INTRAMUSCULAR | Status: AC
  Administered 2023-06-13: 20 mL
  Filled 2023-06-13: qty 1

## 2023-06-13 NOTE — ED Notes (Signed)
EDP at bedside for lac repair. 

## 2023-06-13 NOTE — Discharge Instructions (Signed)
 See your doctor return to the emergency department in 5 to 7 days for suture removal.  For the first 12 to 24 hours keep the bandage on your head, as pressure will help stop the bleeding.  There is a small hemostatic gauze that was placed over the sutured wound which can stay in place.  If it falls off on its own you can remove it.  Please keep your wound clean by washing at least daily with soap and water.  Use antibiotic ointment and a bandage. If you see any signs of infection like spreading redness, pus coming from the wound, extreme pain, fevers, chills or any other worsening doctor right away or come back to the emergency department   Thank you for choosing [place] for your health care today!  Please see your primary doctor this week for a follow up appointment.   If you have any new, worsening, or unexpected symptoms call your doctor right away or come back to the emergency department for reevaluation.  It was my pleasure to care for you today.   Daneil Dan [place] Charon, MD

## 2023-06-13 NOTE — ED Triage Notes (Addendum)
 Pt reports she fell getting out of her chair this morning and hit her head on a magazine rack. Pt has laceration to forehead. Pt takes xarelto. Pt denies LOC. Pt has broken tooth from fall

## 2023-06-13 NOTE — ED Provider Notes (Signed)
 [facility] Provider Note    Event Date/Time   First MD Initiated Contact with Patient 06/13/23 [phone]     (approximate)   History   Fall and Laceration   HPI  Glenda Williams is a 83 y.o. female   Past medical history of A-fib on anticoagulation who presents to the emergency department with a mechanical slip and fall while she was transferring from her lift chair to get up and use her cane she fell and struck her head against a shelf.  She has a laceration to her forehead.  She did not lose consciousness.  She did not suffer any other injuries.  She is otherwise in her regular state of health with no recent medical illnesses and no other acute medical complaints.  Independent Historian contributed to assessment above: Her son and her husband are at bedside to corroborate information past medical history is above    Physical Exam   Triage Vital Signs: ED Triage Vitals  Encounter Vitals Group     BP 06/13/23 0357 (!) 145/55     Systolic BP Percentile --      Diastolic BP Percentile --      Pulse Rate 06/13/23 0357 72     Resp 06/13/23 0357 18     Temp --      Temp src --      SpO2 06/13/23 0357 100 %     Weight 06/13/23 0334 290 lb (131.5 kg)     Height 06/13/23 0334 5\' 5"  (1.651 m)     Head Circumference --      Peak Flow --      Pain Score 06/13/23 0334 9     Pain Loc --      Pain Education --      Exclude from Growth Chart --     Most recent vital signs: Vitals:   06/13/23 0700 06/13/23 0730  BP: (!) 142/40   Pulse:    Resp:  16  Temp:    SpO2:      General: Awake, no distress.  CV:  Good peripheral perfusion.  Resp:  Normal effort.  Abd:  No distention.  Other:  Stellate laceration to the left side of her forehead that is oozing blood.  Neck supple with full range of motion no deformity or C-spine tenderness to palpation.  Head to toe examination otherwise reveals no obvious signs of trauma moving all extremities full active range of  motion no deformities no chest wall tenderness abdominal tenderness or T or L-spine tenderness.   ED Results / Procedures / Treatments   Labs (all labs ordered are listed, but only abnormal results are displayed) Labs Reviewed - No data to display    RADIOLOGY I independently reviewed and interpreted CT of the head see no obvious bleeding or midline shift I also reviewed radiologist's formal read.   PROCEDURES:  Critical Care performed: No  .Laceration Repair  Date/Time: 06/13/2023 7:58 AM  Performed by: Pilar Jarvis, MD Authorized by: Pilar Jarvis, MD   Consent:    Consent obtained:  Verbal   Consent given by:  Patient   Risks discussed:  Pain and poor wound healing   Alternatives discussed:  No treatment and delayed treatment Universal protocol:    Procedure explained and questions answered to patient or proxy's satisfaction: yes     Patient identity confirmed:  Verbally with patient Anesthesia:    Anesthesia method:  Local infiltration   Local anesthetic:  Lidocaine  1% WITH epi Laceration details:    Location:  Face   Face location:  Forehead   Length (cm):  6   Depth (mm):  5 Exploration:    Hemostasis achieved with:  Direct pressure   Wound extent: no foreign body   Treatment:    Area cleansed with:  Povidone-iodine   Amount of cleaning:  Extensive   Irrigation solution:  Sterile saline   Irrigation method:  Syringe   Visualized foreign bodies/material removed: no     Debridement:  None Skin repair:    Repair method:  Sutures   Suture size:  5-0   Suture material:  Nylon   Suture technique:  Simple interrupted   Number of sutures:  8 Approximation:    Approximation:  Close Repair type:    Repair type:  Simple Post-procedure details:    Dressing:  Sterile dressing   Procedure completion:  Tolerated    MEDICATIONS ORDERED IN ED: Medications  lidocaine-EPINEPHrine (XYLOCAINE W/EPI) 2 %-1:100000 (with pres) injection 20 mL (20 mLs Other Given 06/13/23  0420)  Tdap (BOOSTRIX) injection 0.5 mL (0.5 mLs Intramuscular Given 06/13/23 0451)     IMPRESSION / MDM / ASSESSMENT AND PLAN / ED COURSE  I reviewed the triage vital signs and the nursing notes.                                Patient's presentation is most consistent with acute presentation with potential threat to life or bodily function.  Differential diagnosis includes, but is not limited to, mechanical slip and fall leading to ICH, skull fracture, laceration   The patient is on the cardiac monitor to evaluate for evidence of arrhythmia and/or significant heart rate changes.  MDM:    Laceration repaired as above.  Applied hemostatic gauze as well as a bandage with adequate hemostasis.  Tolerated well.  CT scan of the head shows no skull fracture or internal bleeding.  No other signs of traumatic injury on my exam.  Stable for discharge home.  Return precautions given.        FINAL CLINICAL IMPRESSION(S) / ED DIAGNOSES   Final diagnoses:  Injury of head, initial encounter  Facial laceration, initial encounter     Rx / DC Orders   ED Discharge Orders     None        Note:  This document was prepared using Dragon voice recognition software and may include unintentional dictation errors.    Pilar Jarvis, MD 06/13/23 [phone]

## 2023-06-13 NOTE — ED Notes (Signed)
 Patient transported to CT

## 2023-06-14 ENCOUNTER — Telehealth: Payer: Self-pay

## 2023-06-14 NOTE — Telephone Encounter (Unsigned)
 Copied from CRM [id number]. Topic: General - Other >> Jun 14, 2023  4:28 PM Patsy Lager T wrote: Reason for CRM: Nathaniel Man [phone] from [facility] called stated patient missed her PT visit after a fall she had earlier this week. He also would like an order for a transport chair.

## 2023-06-15 ENCOUNTER — Other Ambulatory Visit: Payer: Self-pay

## 2023-06-15 NOTE — Telephone Encounter (Signed)
 Ok to write DME order for transport chair and I will sign. Need to know where they'd like [place] to send this.

## 2023-06-15 NOTE — Telephone Encounter (Signed)
 Placed signed order on sorter to be faxed to [facility] location Fax #[phone] Telephone # [phone]

## 2023-06-15 NOTE — Progress Notes (Signed)
 Opened in error

## 2023-06-18 ENCOUNTER — Telehealth: Payer: Self-pay

## 2023-06-18 DIAGNOSIS — J301 Allergic rhinitis due to pollen: Secondary | ICD-10-CM | POA: Diagnosis not present

## 2023-06-18 DIAGNOSIS — M0579 Rheumatoid arthritis with rheumatoid factor of multiple sites without organ or systems involvement: Secondary | ICD-10-CM | POA: Diagnosis not present

## 2023-06-18 DIAGNOSIS — R052 Subacute cough: Secondary | ICD-10-CM | POA: Diagnosis not present

## 2023-06-18 DIAGNOSIS — N3946 Mixed incontinence: Secondary | ICD-10-CM | POA: Diagnosis not present

## 2023-06-18 DIAGNOSIS — M792 Neuralgia and neuritis, unspecified: Secondary | ICD-10-CM | POA: Diagnosis not present

## 2023-06-18 DIAGNOSIS — J22 Unspecified acute lower respiratory infection: Secondary | ICD-10-CM | POA: Diagnosis not present

## 2023-06-18 NOTE — Telephone Encounter (Signed)
 Copied from CRM [id number]. Topic: Clinical - Home Health Verbal Orders >> Jun 18, 2023 12:12 PM Donald Frost wrote: Caller/Agency: Rice Chamorro Occupational Therapist with [facility] Callback Number: [phone]  Any new concerns about the patient? Yes she states although the provider already should know she wanted to report she had a fall last Wednesday morning and split open her head requiring 8 stitches. She already has a follow up to come in and get the stitches removed. She reports her vitals are fine today and she looks good overall. She did tell her to not over exert herself. Rice Chamorro just wanted to pass this information on to the patients provider. Please assist further.

## 2023-06-18 NOTE — Telephone Encounter (Signed)
 Noted.

## 2023-06-19 DIAGNOSIS — N3946 Mixed incontinence: Secondary | ICD-10-CM | POA: Diagnosis not present

## 2023-06-19 DIAGNOSIS — J22 Unspecified acute lower respiratory infection: Secondary | ICD-10-CM | POA: Diagnosis not present

## 2023-06-19 DIAGNOSIS — R052 Subacute cough: Secondary | ICD-10-CM | POA: Diagnosis not present

## 2023-06-19 DIAGNOSIS — M792 Neuralgia and neuritis, unspecified: Secondary | ICD-10-CM | POA: Diagnosis not present

## 2023-06-19 DIAGNOSIS — J301 Allergic rhinitis due to pollen: Secondary | ICD-10-CM | POA: Diagnosis not present

## 2023-06-19 DIAGNOSIS — M0579 Rheumatoid arthritis with rheumatoid factor of multiple sites without organ or systems involvement: Secondary | ICD-10-CM | POA: Diagnosis not present

## 2023-06-20 DIAGNOSIS — E669 Obesity, unspecified: Secondary | ICD-10-CM | POA: Diagnosis not present

## 2023-06-20 DIAGNOSIS — J22 Unspecified acute lower respiratory infection: Secondary | ICD-10-CM | POA: Diagnosis not present

## 2023-06-20 DIAGNOSIS — J301 Allergic rhinitis due to pollen: Secondary | ICD-10-CM | POA: Diagnosis not present

## 2023-06-20 DIAGNOSIS — Z7409 Other reduced mobility: Secondary | ICD-10-CM | POA: Diagnosis not present

## 2023-06-20 DIAGNOSIS — I5032 Chronic diastolic (congestive) heart failure: Secondary | ICD-10-CM | POA: Diagnosis not present

## 2023-06-20 DIAGNOSIS — G250 Essential tremor: Secondary | ICD-10-CM | POA: Diagnosis not present

## 2023-06-20 DIAGNOSIS — Z6834 Body mass index (BMI) 34.0-34.9, adult: Secondary | ICD-10-CM | POA: Diagnosis not present

## 2023-06-20 DIAGNOSIS — F418 Other specified anxiety disorders: Secondary | ICD-10-CM | POA: Diagnosis not present

## 2023-06-20 DIAGNOSIS — M1990 Unspecified osteoarthritis, unspecified site: Secondary | ICD-10-CM | POA: Diagnosis not present

## 2023-06-20 DIAGNOSIS — I110 Hypertensive heart disease with heart failure: Secondary | ICD-10-CM | POA: Diagnosis not present

## 2023-06-20 DIAGNOSIS — N3946 Mixed incontinence: Secondary | ICD-10-CM | POA: Diagnosis not present

## 2023-06-20 DIAGNOSIS — E559 Vitamin D deficiency, unspecified: Secondary | ICD-10-CM | POA: Diagnosis not present

## 2023-06-20 DIAGNOSIS — M792 Neuralgia and neuritis, unspecified: Secondary | ICD-10-CM | POA: Diagnosis not present

## 2023-06-20 DIAGNOSIS — R052 Subacute cough: Secondary | ICD-10-CM | POA: Diagnosis not present

## 2023-06-20 DIAGNOSIS — I4891 Unspecified atrial fibrillation: Secondary | ICD-10-CM | POA: Diagnosis not present

## 2023-06-20 DIAGNOSIS — G629 Polyneuropathy, unspecified: Secondary | ICD-10-CM | POA: Diagnosis not present

## 2023-06-20 DIAGNOSIS — Z9181 History of falling: Secondary | ICD-10-CM | POA: Diagnosis not present

## 2023-06-20 DIAGNOSIS — Z7901 Long term (current) use of anticoagulants: Secondary | ICD-10-CM | POA: Diagnosis not present

## 2023-06-20 DIAGNOSIS — Z87440 Personal history of urinary (tract) infections: Secondary | ICD-10-CM | POA: Diagnosis not present

## 2023-06-20 DIAGNOSIS — K219 Gastro-esophageal reflux disease without esophagitis: Secondary | ICD-10-CM | POA: Diagnosis not present

## 2023-06-20 DIAGNOSIS — F429 Obsessive-compulsive disorder, unspecified: Secondary | ICD-10-CM | POA: Diagnosis not present

## 2023-06-20 DIAGNOSIS — M0579 Rheumatoid arthritis with rheumatoid factor of multiple sites without organ or systems involvement: Secondary | ICD-10-CM | POA: Diagnosis not present

## 2023-06-21 ENCOUNTER — Ambulatory Visit: Admitting: Family Medicine

## 2023-06-21 ENCOUNTER — Encounter: Payer: Self-pay | Admitting: Family Medicine

## 2023-06-21 VITALS — BP 129/74 | HR 71 | Ht 65.0 in | Wt 219.0 lb

## 2023-06-21 DIAGNOSIS — L89311 Pressure ulcer of right buttock, stage 1: Secondary | ICD-10-CM

## 2023-06-21 DIAGNOSIS — Z7901 Long term (current) use of anticoagulants: Secondary | ICD-10-CM | POA: Diagnosis not present

## 2023-06-21 DIAGNOSIS — S0181XD Laceration without foreign body of other part of head, subsequent encounter: Secondary | ICD-10-CM | POA: Diagnosis not present

## 2023-06-21 DIAGNOSIS — L89321 Pressure ulcer of left buttock, stage 1: Secondary | ICD-10-CM | POA: Diagnosis not present

## 2023-06-21 DIAGNOSIS — R4689 Other symptoms and signs involving appearance and behavior: Secondary | ICD-10-CM | POA: Diagnosis not present

## 2023-06-21 MED ORDER — FLUVOXAMINE MALEATE 50 MG PO TABS: 150.0000 mg | ORAL_TABLET | Freq: Every day | ORAL | 5 refills | Status: DC

## 2023-06-21 NOTE — Progress Notes (Signed)
 Acute visit   Patient: Glenda Williams   DOB: 1940-12-13   83 y.o. Female  MRN: [id number] PCP: Erasmo Downer, MD   Chief Complaint  Patient presents with   Follow-up    Patient was seen at [facility] on 06/13/23 due to a fall. A CT of head was completed. Pt reports she is feeling a little better everyday and things are improving. Patients reports she is ready to have stitches taken out. States she took 2 500 mg tylenol this morning.    Subjective    Discussed the use of AI scribe software for clinical note transcription with the patient, who gave verbal consent to proceed.  History of Present Illness   An 83 year old patient with a history of AFib on anticoagulation presents for a follow-up visit after a mechanical fall at home. The patient was transferring from a lift chair using a cane when she slipped and hit her head against a shelf. She sustained a forehead laceration but did not lose consciousness. The laceration was repaired in the emergency department and a CT scan of the head showed no skull fracture or internal bleeding. The patient also reports two small pressure ulcers on her buttocks, which she has been trying to manage at home. The patient's son mentions that she has a tendency to obsessively clean, which he believes is a symptom of obsessive-compulsive disorder (OCD).        Review of Systems  Objective    BP 129/74 (BP Location: Left Arm, Patient Position: Sitting, Cuff Size: Large)   Pulse 71   Ht 5\' 5"  (1.651 m)   Wt 219 lb (99.3 kg)   SpO2 100%   BMI 36.44 kg/m    Physical Exam   Physical Exam   HEENT: Forehead laceration healing well, sutures removed. SKIN: Pressure sores on buttocks, left side healing, right side with skin breakdown.        No results found for any visits on 06/21/23.  Assessment & Plan     Problem List Items Addressed This Visit       Other   Current use of long term anticoagulation   Compulsive behavior   Other Visit  Diagnoses       Laceration of forehead, subsequent encounter    -  Primary     Pressure injury of right buttock, stage 1       Relevant Orders   Ambulatory referral to Home Health     Pressure injury of left buttock, stage 1       Relevant Orders   Ambulatory referral to Home Health           Forehead Laceration Follow-up for a forehead laceration from a fall on June 13, 2023. The laceration was repaired in the emergency department. No loss of consciousness occurred, and a CT scan showed no skull fracture or internal bleeding. The wound is healing well with minimal oozing despite anticoagulation therapy, which increases bleeding risk. - Remove sutures from forehead laceration - Leave wound open to allow scabbing - Advise gentle washing with soap and water, avoiding scrubbing  Fall Risk Increased fall risk due to mechanical slip and fall. Advised to slow down and be cautious during transfers. Family encouraged to have someone awake with her at night. Considering helmet use during sleep as a precaution. - Encourage caution during transfers - Ensure someone is awake with her at night - Consider helmet use during sleep  Pressure Ulcers Stage 1  pressure ulcers on the buttocks, worse on the right side, likely due to pad pressure. Managing by keeping the area dry and adjusting pad position. Open to home health nurse assistance. Pressure offloading - Arrange for home health nurse to clean and bandage pressure ulcers - Advise adjusting pad position to avoid pressure on ulcers  Obsessive-Compulsive Tendencies Exhibits obsessive-compulsive tendencies, particularly with cleaning. Currently on sertraline (Zoloft) for mood and worry, considering switch to fluvoxamine (Luvox) to address these tendencies. The switch is expected to take 6-8 weeks to notice a difference. Side effects are unlikely due to medication similarity. - Discontinue sertraline (Zoloft) - Initiate fluvoxamine (Luvox) at  equivalent dose (150mg  daily) - Advise that it may take 6-8 weeks to notice a difference  General Health Maintenance Advised on balancing cleanliness with practicality to avoid stress. - Advise on maintaining cleanliness without excessive stress  Follow-up Scheduled follow-up next month for regular check-up and to assess response to medication change. Advised to contact the office if issues arise with home health coordination or medication. - Schedule follow-up visit next month - Monitor response to fluvoxamine (Luvox)       Meds ordered this encounter  Medications   fluvoxaMINE (LUVOX) 50 MG tablet    Sig: Take 3 tablets (150 mg total) by mouth at bedtime.    Dispense:  90 tablet    Refill:  5     Return in about 4 weeks (around 07/19/2023) for as scheduled.      Total time spent on today's visit was greater than 40 minutes, including both face-to-face time and nonface-to-face time personally spent on review of chart (labs and imaging), discussing labs and goals, discussing further work-up, treatment options, answering patient's questions, and coordinating care.   Aden Agreste, MD  [facility] [phone] (phone) [phone] (fax)  [facility]

## 2023-06-25 DIAGNOSIS — Z6831 Body mass index (BMI) 31.0-31.9, adult: Secondary | ICD-10-CM | POA: Diagnosis not present

## 2023-06-25 DIAGNOSIS — I480 Paroxysmal atrial fibrillation: Secondary | ICD-10-CM | POA: Diagnosis not present

## 2023-06-25 DIAGNOSIS — M0579 Rheumatoid arthritis with rheumatoid factor of multiple sites without organ or systems involvement: Secondary | ICD-10-CM | POA: Diagnosis not present

## 2023-06-25 DIAGNOSIS — I10 Essential (primary) hypertension: Secondary | ICD-10-CM | POA: Diagnosis not present

## 2023-06-25 DIAGNOSIS — R600 Localized edema: Secondary | ICD-10-CM | POA: Diagnosis not present

## 2023-06-25 DIAGNOSIS — K219 Gastro-esophageal reflux disease without esophagitis: Secondary | ICD-10-CM | POA: Diagnosis not present

## 2023-06-25 DIAGNOSIS — E66811 Obesity, class 1: Secondary | ICD-10-CM | POA: Diagnosis not present

## 2023-06-27 ENCOUNTER — Ambulatory Visit: Payer: Self-pay

## 2023-06-27 ENCOUNTER — Other Ambulatory Visit: Payer: Self-pay

## 2023-06-27 VITALS — BP 130/68 | HR 57

## 2023-06-27 DIAGNOSIS — R296 Repeated falls: Secondary | ICD-10-CM | POA: Insufficient documentation

## 2023-06-27 NOTE — Patient Outreach (Signed)
 Complex Care Management   Visit Note  06/27/2023  Name:  Glenda Williams MRN: [id number] DOB: [birth year]  Situation: Referral received for Complex Care Management related to  Rheumatoid arthritis and falls  I obtained verbal consent from Patient.  Visit completed with patient  on the phone  Background:   Past Medical History:  Diagnosis Date   A-fib [facility])    Arthritis    Bell palsy 09/04/2014   Congestion of nasal sinus 02/14/2023   Cough with sputum 02/14/2023   GERD (gastroesophageal reflux disease)    Heart disease    Rheumatoid arthritis (HCC)    Rhinosinusitis 02/14/2023   Thyroid  disease    Tremors of nervous system     Assessment: Patient Reported Symptoms:  Cognitive Cognitive Status: Alert and oriented to person, place, and time, Insightful and able to interpret abstract concepts, Normal speech and language skills Cognitive/Intellectual Conditions Management [RPT]: None reported or documented in medical history or problem list   Health Maintenance Behaviors: Annual physical exam, Immunizations Healing Pattern: Average  Neurological Neurological Review of Symptoms: No symptoms reported Neurological Conditions:  (none per patient)  HEENT HEENT Symptoms Reported: No symptoms reported HEENT Conditions: Tooth problem(s) Tooth Problems:  (broke tooth from a fall. Saw dentist 1 week ago.) Tooth problem(s)  Cardiovascular Cardiovascular Symptoms Reported: No symptoms reported Does patient have uncontrolled Hypertension?: No Is patient checking Blood Pressure at home?: Yes Patient's Recent BP reading at home: 130/68 Cardiovascular Conditions: Hypertension Cardiovascular Management Strategies: Routine screening, Medication therapy Cardiovascular Self-Management Outcome: 4 (good)  Respiratory Respiratory Symptoms Reported: No symptoms reported    Endocrine Patient reports the following symptoms related to hypoglycemia or hyperglycemia : No symptoms reported Is patient  diabetic?: No    Gastrointestinal Gastrointestinal Symptoms Reported: Incontinence Additional Gastrointestinal Details: patient states she is incontinent of urine Gastrointestinal Management Strategies: Incontinence garment/pad Gastrointestinal Self-Management Outcome: 4 (good) Nutrition Risk Screen (CP): No indicators present  Genitourinary Genitourinary Symptoms Reported: No symptoms reported    Integumentary Integumentary Symptoms Reported: Wound Additional Integumentary Details: patient reports having scabbed area on forehead from laceration sustained in recent fall. Patient states area is healing well. Patient reports 2 small pressure areas on buttocks healing well. Denies any signs of infection to either wound area. Skin Conditions: Wound Skin Management Strategies: Routine screening Skin Self-Management Outcome: 4 (good)  Musculoskeletal Musculoskelatal Symptoms Reviewed: Difficulty walking, Unsteady gait Musculoskeletal Conditions: Osteoarthritis, Rheumatoid arthritis Musculoskeletal Comment: patient states having osteoarthritis in both knees and is scheduled to have upcoming steroid infections. Falls in the past year?: Yes Number of falls in past year: 2 or more Was there an injury with Fall?: Yes Fall Risk Category Calculator: 3 Patient Fall Risk Level: High Fall Risk Patient at Risk for Falls Due to: History of fall(s), Impaired balance/gait, Impaired mobility Fall risk Follow up: Falls prevention discussed, Falls evaluation completed  Psychosocial Psychosocial Symptoms Reported: No symptoms reported     Quality of Family Relationships: supportive      06/27/2023    5:12 PM 05/17/2023    2:40 PM 02/14/2023    3:32 PM 01/23/2023    3:04 PM 12/21/2022    9:34 AM  Depression screen PHQ 2/9  Decreased Interest 0 1 0 0 0  Down, Depressed, Hopeless 0 0 0 0 0  PHQ - 2 Score 0 1 0 0 0  Altered sleeping  0 1 0   Tired, decreased energy  2 2 0   Change in appetite  0 0 0  Feeling bad or failure about yourself   0 1 0   Trouble concentrating  1 1 0   Moving slowly or fidgety/restless  1 0 0   Suicidal thoughts  0 0 0   PHQ-9 Score  5 5 0   Difficult doing work/chores  Very difficult Somewhat difficult Not difficult at all      Vitals:   06/27/23 1420  BP: 130/68  Pulse: (!) 57    Medications Reviewed Today     Reviewed by Lamerle Jabs E, RN (Registered Nurse) on 06/27/23 at 1431  Med List Status: <None>   Medication Order Taking? Sig Documenting Provider Last Dose Status Informant  albuterol  (VENTOLIN  HFA) 108 (90 Base) MCG/ACT inhaler [id number] Yes Inhale 1-2 puffs into the lungs every 4 (four) hours as needed for wheezing or shortness of breath. Adeline Hone, PA-C Taking Active   azelastine  (ASTELIN ) 0.1 % nasal spray [id number] Yes Place 2 sprays into both nostrils 2 (two) times daily. Use in each nostril as directed Tasia Farr, FNP Taking Active   benzonatate  (TESSALON ) 100 MG capsule [id number] No Take 1-2 capsules (100-200 mg total) by mouth 3 (three) times daily as needed for cough.  Patient not taking: Reported on 06/27/2023   Adeline Hone, PA-C Not Taking Active   cetirizine (ZYRTEC) 10 MG tablet [id number] Yes Take 10 mg by mouth daily. [provider] Taking Active Self, Child, Pharmacy Records           Med Note Marrie Sizer, Deanthony Maull E   Wed May 30, 2023  4:16 PM) Patient states taking every day  cholecalciferol  (VITAMIN D ) 400 units TABS tablet [id number] Yes Take 400 Units by mouth daily. [provider] Taking Active Self, Child, Pharmacy Records  conjugated estrogens  (PREMARIN ) vaginal cream [id number] Yes Apply 0.5mg  (pea-sized amount)  just inside the vaginal introitus with a finger-tip on  Monday, Wednesday and Friday nights. Oda Bence, PA-C Taking Active Self, Child, Pharmacy Records  EPINEPHrine  0.3 mg/0.3 mL IJ SOAJ injection [id number] Yes Inject 0.3 mLs into the skin once as needed for anaphylaxis. [provider] Taking Active Self, Child, Pharmacy Records  fluvoxaMINE  (LUVOX ) 50 MG tablet [id number] No Take 3 tablets (150 mg total) by mouth at bedtime.  Patient not taking: Reported on 06/27/2023   Mazie Speed, MD Not Taking Active   folic acid  (FOLVITE ) 1 MG tablet [id number] Yes Take 1 mg by mouth daily. [provider] Taking Active Self, Child, Pharmacy Records  gabapentin  (NEURONTIN ) 100 MG capsule [id number] Yes Take 1 capsule (100 mg total) by mouth 2 (two) times daily. Mazie Speed, MD Taking Active   golimumab  (SIMPONI  ARIA) 50 MG/4ML SOLN injection [id number] Yes Inject into the vein. [provider] Taking Active Self  guaiFENesin  200 MG tablet [id number] Yes Take 1 tablet (200 mg total) by mouth every 4 (four) hours as needed for cough or to loosen phlegm. Tasia Farr, FNP Taking Active   hydrochlorothiazide  (HYDRODIURIL ) 25 MG tablet [id number] Yes Take 25 mg by mouth daily. [provider] Taking Active Self, Child, Pharmacy Records  hydrocortisone  cream 1 % [id number] Yes Apply 1 application  topically as needed for itching. [provider] Taking Active Self, Child, Pharmacy Records  ketoconazole  (NIZORAL ) 2 % cream [id number] Yes Apply 1 application topically daily. Mazie Speed, MD Taking Active Self, Child, Pharmacy Records  levothyroxine  (SYNTHROID ) 125 MCG tablet [id number]  Take 1 tablet (125 mcg total) by mouth daily. Mazie Speed, MD  Active   methotrexate  (RHEUMATREX) 2.5 MG tablet [id number] Yes Take 20 mg by mouth every Thursday. [provider] Taking Active Self, Child, Pharmacy Records           Med Note Coye Diver Nov 04, 2017 12:52 PM)    NYSTATIN  powder [id number] Yes APPLY TOPICALLY TO SKIN FOLDS THREE TIMES DAILY Normie Becton, FNP Taking Active   Omega-3 Fatty Acids (FISH OIL) 1000 MG CAPS [id number] Yes Take 1 capsule by mouth daily. [provider] Taking Active  Self, Child, Pharmacy Records  omeprazole  (PRILOSEC) 20 MG capsule [id number] Yes TAKE 1 CAPSULE BY MOUTH TWICE  DAILY BEFORE MEALS Bacigalupo, Stan Eans, MD Taking Active   potassium chloride  SA (K-DUR,KLOR-CON ) 20 MEQ tablet [id number] Yes Take 1 tablet by mouth  daily Woody Heading, MD Taking Active Self, Child, Pharmacy Records  predniSONE  (DELTASONE ) 20 MG tablet [id number] No 2 tabs poqday 1-3, 1 tabs poqday 4-6  Patient not taking: Reported on 06/27/2023   Tapia, Leisa, PA-C Not Taking Active   propranolol  (INDERAL ) 40 MG tablet [id number] Yes 80 mg in the am and 40 mg at night [provider] Taking Active Self, Child, Pharmacy Records  Red Yeast Rice 600 MG CAPS [id number] Yes Take 1 capsule by mouth daily. [provider] Taking Active Self, Child, Pharmacy Records  rivaroxaban  (XARELTO ) 20 MG TABS tablet [id number] Yes Take 1 tablet (20 mg total) by mouth daily with supper. Please report any side effects to R leg following starting including worsening of bruise, new/changed edema, color variation or temperature change. Normie Becton, FNP Taking Active             Recommendation:   PCP Follow-up  Follow Up Plan:   Telephone follow-up in 1 month with RN care manager  Verba Girt RN, BSN, CCM [place]  [facility], Population Health Case Manager Phone: [phone]

## 2023-06-27 NOTE — Patient Instructions (Signed)
 Visit Information  Thank you for taking time to visit with me today. Please don't hesitate to contact me if I can be of assistance to you before our next scheduled appointment.  Our next appointment is by telephone on 07/31/23  at 1:30 pm Please call the care guide team at [phone] if you need to cancel or reschedule your appointment.   Following is a copy of your care plan:   Goals Addressed             This Visit's Progress    COMPLETED: Patient / family members will work with CCM team to gain knowlege and help with management and resources for health conditions.       Interventions Today    Flowsheet Row Most Recent Value  Chronic Disease   Chronic disease during today's visit Other  [RA, poor mobility/ falls.]  General Interventions   General Interventions Discussed/Reviewed Community Resources  [Resource: Contact address and phone number for the [employer] DME provided to daughter Ellen Guppy  Education Interventions   Education Provided Provided Education  [Advised to [employer] provider to discuss coverage of supplies and or DME. Name and contact phone number for this [facility] provider to daughter, Adah Acron Sanders.]  Pharmacy Interventions   Pharmacy Dicussed/Reviewed Pharmacy Topics Discussed  [medications reviewed. Adherence to medications dicussed and advised.]  Advanced Directive Interventions   Advanced Directives Discussed/Reviewed Advanced Directives Discussed  [Confirmed patient has Advanced directive/ living will/ HPOA]           VBCI RN Care Plan- falls       Problems:  Chronic Disease Management support and education needs related to falls Knowledge Deficits related to Fall prevention  Goal: Over the next 3 months the Patient will attend all scheduled medical appointments: with providers as evidenced by patient report/ chart review.         continue to work with Medical illustrator and/or Social Worker to address care management and care  coordination needs related to falls as evidenced by adherence to care management team scheduled appointments     demonstrate Ongoing adherence to prescribed treatment plan for falls as evidenced by patient report/ chart review.  take all medications exactly as prescribed and will call provider for medication related questions as evidenced by patient report/ chart review.     Patient will report decrease in falls over the next 3 months Patient will report implementation of fall strategies.   Interventions:   Falls Interventions: Provided written and verbal education re: potential causes of falls and Fall prevention strategies Reviewed medications and discussed potential side effects of medications such as dizziness and frequent urination Advised patient of importance of notifying provider of falls Assessed for falls since last encounter Assessed patients knowledge of fall risk prevention secondary to previously provided education Discussed fall prevention strategies. Chair alarm, Agreed with recommendation to wear fall protection helmet at HS.  Confirmed patient receiving ongoing home health PT/OT services.   Patient Self-Care Activities:  Attend all scheduled provider appointments Call pharmacy for medication refills 3-7 days in advance of running out of medications Call provider office for new concerns or questions  Take medications as prescribed   Report falls to provider  Utilize fall prevention suggestions/ strategies : see fall prevention education article attached with after visit summary   Plan:  Telephone follow up appointment with care management team member scheduled for:  07/31/23 at 1:30 pm           [facility] RN Care  Plan- Rheumatoid Arthritis       Problems:  Chronic Disease Management support and education needs related to RA  Goal: Over the next 3 months the Patient will attend all scheduled medical appointments: with provider as evidenced by patient report/ chart  review.         continue to work with Medical illustrator and/or Social Worker to address care management and care coordination needs related to RA as evidenced by adherence to care management team scheduled appointments     demonstrate Ongoing adherence to prescribed treatment plan for RA as evidenced by patient report/ chart review take all medications exactly as prescribed and will call provider for medication related questions as evidenced by patient report/ chart review.      Interventions:  Assessed pain level Advised to avoid anti-inflammatory foods,  Recommend stress management Advised to take medications as prescribed. Advised to report new or ongoing symptoms or concerns Advised to keep follow up visits with providers as recommended.  Advised to manage pain by taking prescribed medications, rest, hot/ cold therapy Reviewed upcoming provider visits   Patient Self-Care Activities:  Attend all scheduled provider appointments Call pharmacy for medication refills 3-7 days in advance of running out of medications Call provider office for new concerns or questions  Take medications as prescribed   Manage pain with taking pain medications as prescribed, using hot/ cold therapy as needed.  Keep follow up appointments with providers as recommended.   Plan:  Telephone follow up appointment with care management team member scheduled for:  07/31/23 at 1:30 pm             Please call the Suicide and Crisis Lifeline: 988 call 1-800-273-TALK (toll free, 24 hour hotline) if you are experiencing a Mental Health or Behavioral Health Crisis or need someone to talk to.  Patient verbalizes understanding of instructions and care plan provided today and agrees to view in MyChart. Active MyChart status and patient understanding of how to access instructions and care plan via MyChart confirmed with patient.     Saliyah Gillin RN, BSN, CCM [place]  [facility], Population  Health Case Manager Phone: [phone]  Fall Prevention in the Home, Adult Falls can cause injuries and can happen to people of all ages. There are many things you can do to make your home safer and to help prevent falls. What actions can I take to prevent falls? General information Use good lighting in all rooms. Make sure to: Replace any light bulbs that burn out. Turn on the lights in dark areas and use night-lights. Keep items that you use often in easy-to-reach places. Lower the shelves around your home if needed. Move furniture so that there are clear paths around it. Do not use throw rugs or other things on the floor that can make you trip. If any of your floors are uneven, fix them. Add color or contrast paint or tape to clearly mark and help you see: Grab bars or handrails. First and last steps of staircases. Where the edge of each step is. If you use a ladder or stepladder: Make sure that it is fully opened. Do not climb a closed ladder. Make sure the sides of the ladder are locked in place. Have someone hold the ladder while you use it. Know where your pets are as you move through your home. What can I do in the bathroom?     Keep the floor dry. Clean up any water on the floor  right away. Remove soap buildup in the bathtub or shower. Buildup makes bathtubs and showers slippery. Use non-skid mats or decals on the floor of the bathtub or shower. Attach bath mats securely with double-sided, non-slip rug tape. If you need to sit down in the shower, use a non-slip stool. Install grab bars by the toilet and in the bathtub and shower. Do not use towel bars as grab bars. What can I do in the bedroom? Make sure that you have a light by your bed that is easy to reach. Do not use any sheets or blankets on your bed that hang to the floor. Have a firm chair or bench with side arms that you can use for support when you get dressed. What can I do in the kitchen? Clean up any  spills right away. If you need to reach something above you, use a step stool with a grab bar. Keep electrical cords out of the way. Do not use floor polish or wax that makes floors slippery. What can I do with my stairs? Do not leave anything on the stairs. Make sure that you have a light switch at the top and the bottom of the stairs. Make sure that there are handrails on both sides of the stairs. Fix handrails that are broken or loose. Install non-slip stair treads on all your stairs if they do not have carpet. Avoid having throw rugs at the top or bottom of the stairs. Choose a carpet that does not hide the edge of the steps on the stairs. Make sure that the carpet is firmly attached to the stairs. Fix carpet that is loose or worn. What can I do on the outside of my home? Use bright outdoor lighting. Fix the edges of walkways and driveways and fix any cracks. Clear paths of anything that can make you trip, such as tools or rocks. Add color or contrast paint or tape to clearly mark and help you see anything that might make you trip as you walk through a door, such as a raised step or threshold. Trim any bushes or trees on paths to your home. Check to see if handrails are loose or broken and that both sides of all steps have handrails. Install guardrails along the edges of any raised decks and porches. Have leaves, snow, or ice cleared regularly. Use sand, salt, or ice melter on paths if you live where there is ice and snow during the winter. Clean up any spills in your garage right away. This includes grease or oil spills. What other actions can I take? Review your medicines with your doctor. Some medicines can cause dizziness or changes in blood pressure, which increase your risk of falling. Wear shoes that: Have a low heel. Do not wear high heels. Have rubber bottoms and are closed at the toe. Feel good on your feet and fit well. Use tools that help you move around if needed. These  include: Canes. Walkers. Scooters. Crutches. Ask your doctor what else you can do to help prevent falls. This may include seeing a physical therapist to learn to do exercises to move better and get stronger. Where to find more information Centers for Disease Control and Prevention, STEADI: TonerPromos.no [employer] on Aging: BaseRingTones.pl National Institute on Aging: BaseRingTones.pl Contact a doctor if: You are afraid of falling at home. You feel weak, drowsy, or dizzy at home. You fall at home. Get help right away if you: Lose consciousness or have trouble moving after a  fall. Have a fall that causes a head injury. These symptoms may be an emergency. Get help right away. Call 911. Do not wait to see if the symptoms will go away. Do not drive yourself to the hospital. This information is not intended to replace advice given to you by your health care provider. Make sure you discuss any questions you have with your health care provider. Document Revised: 10/24/2021 Document Reviewed: 10/24/2021 Elsevier Patient Education  2024 [employer].

## 2023-06-28 DIAGNOSIS — J22 Unspecified acute lower respiratory infection: Secondary | ICD-10-CM | POA: Diagnosis not present

## 2023-06-28 DIAGNOSIS — J301 Allergic rhinitis due to pollen: Secondary | ICD-10-CM | POA: Diagnosis not present

## 2023-06-28 DIAGNOSIS — R052 Subacute cough: Secondary | ICD-10-CM | POA: Diagnosis not present

## 2023-06-28 DIAGNOSIS — N3946 Mixed incontinence: Secondary | ICD-10-CM | POA: Diagnosis not present

## 2023-06-28 DIAGNOSIS — M0579 Rheumatoid arthritis with rheumatoid factor of multiple sites without organ or systems involvement: Secondary | ICD-10-CM | POA: Diagnosis not present

## 2023-06-28 DIAGNOSIS — M792 Neuralgia and neuritis, unspecified: Secondary | ICD-10-CM | POA: Diagnosis not present

## 2023-06-29 DIAGNOSIS — R052 Subacute cough: Secondary | ICD-10-CM | POA: Diagnosis not present

## 2023-06-29 DIAGNOSIS — J301 Allergic rhinitis due to pollen: Secondary | ICD-10-CM | POA: Diagnosis not present

## 2023-06-29 DIAGNOSIS — J22 Unspecified acute lower respiratory infection: Secondary | ICD-10-CM | POA: Diagnosis not present

## 2023-06-29 DIAGNOSIS — M0579 Rheumatoid arthritis with rheumatoid factor of multiple sites without organ or systems involvement: Secondary | ICD-10-CM | POA: Diagnosis not present

## 2023-06-29 DIAGNOSIS — N3946 Mixed incontinence: Secondary | ICD-10-CM | POA: Diagnosis not present

## 2023-06-29 DIAGNOSIS — M792 Neuralgia and neuritis, unspecified: Secondary | ICD-10-CM | POA: Diagnosis not present

## 2023-07-04 DIAGNOSIS — N3946 Mixed incontinence: Secondary | ICD-10-CM | POA: Diagnosis not present

## 2023-07-04 DIAGNOSIS — J301 Allergic rhinitis due to pollen: Secondary | ICD-10-CM | POA: Diagnosis not present

## 2023-07-04 DIAGNOSIS — M792 Neuralgia and neuritis, unspecified: Secondary | ICD-10-CM | POA: Diagnosis not present

## 2023-07-04 DIAGNOSIS — J22 Unspecified acute lower respiratory infection: Secondary | ICD-10-CM | POA: Diagnosis not present

## 2023-07-04 DIAGNOSIS — R052 Subacute cough: Secondary | ICD-10-CM | POA: Diagnosis not present

## 2023-07-04 DIAGNOSIS — M0579 Rheumatoid arthritis with rheumatoid factor of multiple sites without organ or systems involvement: Secondary | ICD-10-CM | POA: Diagnosis not present

## 2023-07-05 DIAGNOSIS — M0579 Rheumatoid arthritis with rheumatoid factor of multiple sites without organ or systems involvement: Secondary | ICD-10-CM | POA: Diagnosis not present

## 2023-07-05 DIAGNOSIS — N3946 Mixed incontinence: Secondary | ICD-10-CM | POA: Diagnosis not present

## 2023-07-05 DIAGNOSIS — J301 Allergic rhinitis due to pollen: Secondary | ICD-10-CM | POA: Diagnosis not present

## 2023-07-05 DIAGNOSIS — M792 Neuralgia and neuritis, unspecified: Secondary | ICD-10-CM | POA: Diagnosis not present

## 2023-07-05 DIAGNOSIS — R052 Subacute cough: Secondary | ICD-10-CM | POA: Diagnosis not present

## 2023-07-05 DIAGNOSIS — J22 Unspecified acute lower respiratory infection: Secondary | ICD-10-CM | POA: Diagnosis not present

## 2023-07-12 ENCOUNTER — Other Ambulatory Visit: Payer: Self-pay | Admitting: Family Medicine

## 2023-07-12 MED ORDER — LEVOTHYROXINE SODIUM 125 MCG PO TABS: 125.0000 ug | ORAL_TABLET | Freq: Every day | ORAL | 1 refills | Status: DC

## 2023-07-12 NOTE — Addendum Note (Signed)
 Addended by: Darrow End on: 07/12/2023 11:55 AM   Modules accepted: Orders

## 2023-07-12 NOTE — Telephone Encounter (Signed)
 [facility] faxed refill request for the following medications:   levothyroxine  (SYNTHROID ) 125 MCG tablet    Please advise

## 2023-07-13 DIAGNOSIS — N3946 Mixed incontinence: Secondary | ICD-10-CM | POA: Diagnosis not present

## 2023-07-13 DIAGNOSIS — J22 Unspecified acute lower respiratory infection: Secondary | ICD-10-CM | POA: Diagnosis not present

## 2023-07-13 DIAGNOSIS — M792 Neuralgia and neuritis, unspecified: Secondary | ICD-10-CM | POA: Diagnosis not present

## 2023-07-13 DIAGNOSIS — M0579 Rheumatoid arthritis with rheumatoid factor of multiple sites without organ or systems involvement: Secondary | ICD-10-CM | POA: Diagnosis not present

## 2023-07-13 DIAGNOSIS — R052 Subacute cough: Secondary | ICD-10-CM | POA: Diagnosis not present

## 2023-07-13 DIAGNOSIS — J301 Allergic rhinitis due to pollen: Secondary | ICD-10-CM | POA: Diagnosis not present

## 2023-07-16 ENCOUNTER — Encounter (HOSPITAL_COMMUNITY): Payer: Self-pay

## 2023-07-16 DIAGNOSIS — M0579 Rheumatoid arthritis with rheumatoid factor of multiple sites without organ or systems involvement: Secondary | ICD-10-CM | POA: Diagnosis not present

## 2023-07-17 ENCOUNTER — Encounter (INDEPENDENT_AMBULATORY_CARE_PROVIDER_SITE_OTHER): Admitting: Vascular Surgery

## 2023-07-17 ENCOUNTER — Ambulatory Visit: Admitting: Family Medicine

## 2023-07-18 ENCOUNTER — Telehealth: Payer: Self-pay

## 2023-07-18 DIAGNOSIS — N3946 Mixed incontinence: Secondary | ICD-10-CM | POA: Diagnosis not present

## 2023-07-18 DIAGNOSIS — R052 Subacute cough: Secondary | ICD-10-CM | POA: Diagnosis not present

## 2023-07-18 DIAGNOSIS — J22 Unspecified acute lower respiratory infection: Secondary | ICD-10-CM | POA: Diagnosis not present

## 2023-07-18 DIAGNOSIS — J301 Allergic rhinitis due to pollen: Secondary | ICD-10-CM | POA: Diagnosis not present

## 2023-07-18 DIAGNOSIS — M792 Neuralgia and neuritis, unspecified: Secondary | ICD-10-CM | POA: Diagnosis not present

## 2023-07-18 DIAGNOSIS — M0579 Rheumatoid arthritis with rheumatoid factor of multiple sites without organ or systems involvement: Secondary | ICD-10-CM | POA: Diagnosis not present

## 2023-07-18 NOTE — Telephone Encounter (Signed)
Ok for verbal orders.    Andreya Lacks Simmons-Robinson, MD  [facility]  

## 2023-07-18 NOTE — Telephone Encounter (Signed)
 Copied from CRM [id number]. Topic: Clinical - Home Health Verbal Orders >> Jul 18, 2023 11:53 AM Felizardo Hotter wrote: Caller/Agency: [facility] per Fayrene Hope Number: [phone] secure line  Service Requested: Skilled Nursing, wound care recertification  Frequency: 1w for 8w Any new concerns about the patient? Yes, 2 lb weight gain, swelling in lower extremities.

## 2023-07-19 DIAGNOSIS — M0579 Rheumatoid arthritis with rheumatoid factor of multiple sites without organ or systems involvement: Secondary | ICD-10-CM | POA: Diagnosis not present

## 2023-07-19 DIAGNOSIS — N3946 Mixed incontinence: Secondary | ICD-10-CM | POA: Diagnosis not present

## 2023-07-19 DIAGNOSIS — J301 Allergic rhinitis due to pollen: Secondary | ICD-10-CM | POA: Diagnosis not present

## 2023-07-19 DIAGNOSIS — M792 Neuralgia and neuritis, unspecified: Secondary | ICD-10-CM | POA: Diagnosis not present

## 2023-07-19 DIAGNOSIS — R052 Subacute cough: Secondary | ICD-10-CM | POA: Diagnosis not present

## 2023-07-19 DIAGNOSIS — J22 Unspecified acute lower respiratory infection: Secondary | ICD-10-CM | POA: Diagnosis not present

## 2023-07-19 NOTE — Telephone Encounter (Signed)
 Attempted to contact Glenda Williams with [facility], was only able to leave a voice message asking her to contact the office back regarding the request. If she call back ok to relay the message per Dr Ardeth Beckers

## 2023-07-20 DIAGNOSIS — I5032 Chronic diastolic (congestive) heart failure: Secondary | ICD-10-CM | POA: Diagnosis not present

## 2023-07-20 DIAGNOSIS — Z7409 Other reduced mobility: Secondary | ICD-10-CM | POA: Diagnosis not present

## 2023-07-20 DIAGNOSIS — I4891 Unspecified atrial fibrillation: Secondary | ICD-10-CM | POA: Diagnosis not present

## 2023-07-20 DIAGNOSIS — J301 Allergic rhinitis due to pollen: Secondary | ICD-10-CM | POA: Diagnosis not present

## 2023-07-20 DIAGNOSIS — N3946 Mixed incontinence: Secondary | ICD-10-CM | POA: Diagnosis not present

## 2023-07-20 DIAGNOSIS — R052 Subacute cough: Secondary | ICD-10-CM | POA: Diagnosis not present

## 2023-07-20 DIAGNOSIS — G250 Essential tremor: Secondary | ICD-10-CM | POA: Diagnosis not present

## 2023-07-20 DIAGNOSIS — Z9181 History of falling: Secondary | ICD-10-CM | POA: Diagnosis not present

## 2023-07-20 DIAGNOSIS — J22 Unspecified acute lower respiratory infection: Secondary | ICD-10-CM | POA: Diagnosis not present

## 2023-07-20 DIAGNOSIS — Z6834 Body mass index (BMI) 34.0-34.9, adult: Secondary | ICD-10-CM | POA: Diagnosis not present

## 2023-07-20 DIAGNOSIS — F429 Obsessive-compulsive disorder, unspecified: Secondary | ICD-10-CM | POA: Diagnosis not present

## 2023-07-20 DIAGNOSIS — E559 Vitamin D deficiency, unspecified: Secondary | ICD-10-CM | POA: Diagnosis not present

## 2023-07-20 DIAGNOSIS — M0579 Rheumatoid arthritis with rheumatoid factor of multiple sites without organ or systems involvement: Secondary | ICD-10-CM | POA: Diagnosis not present

## 2023-07-20 DIAGNOSIS — M1990 Unspecified osteoarthritis, unspecified site: Secondary | ICD-10-CM | POA: Diagnosis not present

## 2023-07-20 DIAGNOSIS — G629 Polyneuropathy, unspecified: Secondary | ICD-10-CM | POA: Diagnosis not present

## 2023-07-20 DIAGNOSIS — K219 Gastro-esophageal reflux disease without esophagitis: Secondary | ICD-10-CM | POA: Diagnosis not present

## 2023-07-20 DIAGNOSIS — F418 Other specified anxiety disorders: Secondary | ICD-10-CM | POA: Diagnosis not present

## 2023-07-20 DIAGNOSIS — Z7901 Long term (current) use of anticoagulants: Secondary | ICD-10-CM | POA: Diagnosis not present

## 2023-07-20 DIAGNOSIS — M792 Neuralgia and neuritis, unspecified: Secondary | ICD-10-CM | POA: Diagnosis not present

## 2023-07-20 DIAGNOSIS — L89312 Pressure ulcer of right buttock, stage 2: Secondary | ICD-10-CM | POA: Diagnosis not present

## 2023-07-20 DIAGNOSIS — E669 Obesity, unspecified: Secondary | ICD-10-CM | POA: Diagnosis not present

## 2023-07-20 DIAGNOSIS — I110 Hypertensive heart disease with heart failure: Secondary | ICD-10-CM | POA: Diagnosis not present

## 2023-07-20 NOTE — Telephone Encounter (Signed)
 Arman Berlin called back and verbalized understanding of note from PCP.

## 2023-07-23 ENCOUNTER — Encounter: Payer: Self-pay | Admitting: Family Medicine

## 2023-07-23 ENCOUNTER — Ambulatory Visit (INDEPENDENT_AMBULATORY_CARE_PROVIDER_SITE_OTHER): Admitting: Family Medicine

## 2023-07-23 VITALS — BP 132/71 | HR 51 | Ht 65.0 in | Wt 219.0 lb

## 2023-07-23 DIAGNOSIS — R7303 Prediabetes: Secondary | ICD-10-CM

## 2023-07-23 DIAGNOSIS — E039 Hypothyroidism, unspecified: Secondary | ICD-10-CM

## 2023-07-23 DIAGNOSIS — E7800 Pure hypercholesterolemia, unspecified: Secondary | ICD-10-CM | POA: Diagnosis not present

## 2023-07-23 DIAGNOSIS — Z789 Other specified health status: Secondary | ICD-10-CM | POA: Insufficient documentation

## 2023-07-23 DIAGNOSIS — I10 Essential (primary) hypertension: Secondary | ICD-10-CM | POA: Diagnosis not present

## 2023-07-23 DIAGNOSIS — R296 Repeated falls: Secondary | ICD-10-CM

## 2023-07-23 DIAGNOSIS — Z79899 Other long term (current) drug therapy: Secondary | ICD-10-CM

## 2023-07-23 DIAGNOSIS — Z7901 Long term (current) use of anticoagulants: Secondary | ICD-10-CM

## 2023-07-23 DIAGNOSIS — R4689 Other symptoms and signs involving appearance and behavior: Secondary | ICD-10-CM

## 2023-07-23 DIAGNOSIS — L89321 Pressure ulcer of left buttock, stage 1: Secondary | ICD-10-CM | POA: Insufficient documentation

## 2023-07-23 DIAGNOSIS — L89311 Pressure ulcer of right buttock, stage 1: Secondary | ICD-10-CM | POA: Diagnosis not present

## 2023-07-23 NOTE — Assessment & Plan Note (Signed)
 Last Lipid panel showed slightly decreased HDL with normal LDL. -Not currently on a statin -Recheck FLP and CMP

## 2023-07-23 NOTE — Assessment & Plan Note (Signed)
 Chronic, stable and good in the office today Continue HCTZ 25mg ; propranolol  at 80am/40pm

## 2023-07-23 NOTE — Assessment & Plan Note (Signed)
 Last TSH slightly elevated and synthroid  dose was increased to 125 mcg. last TSH was 2.7 -Continue Synthroid  at current dose  -TSH ordered; adjust Synthroid  if necessary.

## 2023-07-23 NOTE — Assessment & Plan Note (Signed)
 Pressure injury is improving with home nursing care and regular dressing changes. -continue home nursing care

## 2023-07-23 NOTE — Progress Notes (Signed)
 Established Patient Office Visit  Subjective   Patient ID: Glenda Williams, female    DOB: 01/12/1941  Age: 83 y.o. MRN: [id number]  Chief Complaint  Patient presents with   Medical Management of Chronic Issues   Hypertension   Hypothyroidism    [name] is an 83 y/o female presenting today for chronic disease follow-up in the setting of a series of recent falls. She has not had any falls since her most recent and apart form being sore, her head laceration is healing up well. She has been seen at home by nursing, OT, and PT and has been doing very well from this standpoint. She received infusions at [facility] clinic for her RA. Aside from some mild pain in right wrist which she credits to a repetition injury; she has no other acute concerns.  Home health, OT, PT, Nursing come once a week. OT signed off on her. She feels more sturdy now. She also states her pressure ulcer is looking much better and nursing is addressing this.  PT completed eval last week and is going to extend the home PT.   Hypertension Pertinent negatives include no chest pain, orthopnea, palpitations or shortness of breath.    Past Medical History:  Diagnosis Date   A-fib [facility])    Arthritis    Bell palsy 09/04/2014   Congestion of nasal sinus 02/14/2023   Cough with sputum 02/14/2023   GERD (gastroesophageal reflux disease)    Heart disease    Rheumatoid arthritis (HCC)    Rhinosinusitis 02/14/2023   Thyroid  disease    Tremors of nervous system    Past Surgical History:  Procedure Laterality Date   BREAST BIOPSY Left 1988   Benign   BREAST BIOPSY Left 1987   Benign   BREAST EXCISIONAL BIOPSY     BROW LIFT Bilateral 05/23/2016   Procedure: BLEPHAROPLASTY upper eyelid; w/excess skin;  Surgeon: Zacarias Hermann, MD;  Location: [facility];  Service: Ophthalmology;  Laterality: Bilateral;   CARPAL TUNNEL RELEASE Bilateral    CHOLECYSTECTOMY     Dr. Marquita Situ   COLONOSCOPY  2006   JOINT REPLACEMENT Bilateral     knees   REPLACEMENT TOTAL KNEE Left 2005   REPLACEMENT TOTAL KNEE Right 1998      Review of Systems  Constitutional:  Negative for chills, fever and weight loss.  HENT:  Negative for congestion, ear pain and hearing loss.   Eyes: Negative.   Respiratory:  Negative for cough and shortness of breath.   Cardiovascular:  Positive for leg swelling. Negative for chest pain, palpitations and orthopnea.  Gastrointestinal:  Negative for abdominal pain, blood in stool, constipation, diarrhea, heartburn, nausea and vomiting.  Genitourinary:  Positive for frequency and urgency. Negative for hematuria.  Musculoskeletal:  Positive for back pain and joint pain.  Skin:  Positive for itching. Negative for rash.  Neurological:  Negative for dizziness, tingling and weakness.  Psychiatric/Behavioral:  Negative for depression. The patient is not nervous/anxious.       Objective:     BP 132/71 (BP Location: Right Arm, Patient Position: Sitting, Cuff Size: Large)   Pulse (!) 51   Ht 5\' 5"  (1.651 m)   Wt 219 lb (99.3 kg)   SpO2 98%   BMI 36.44 kg/m  BP Readings from Last 3 Encounters:  07/23/23 132/71  06/27/23 130/68  06/21/23 129/74   Wt Readings from Last 3 Encounters:  07/23/23 219 lb (99.3 kg)  06/21/23 219 lb (99.3 kg)  06/13/23  290 lb (131.5 kg)      Physical Exam Constitutional:      General: She is not in acute distress.    Appearance: Normal appearance. She is not ill-appearing.  HENT:     Head: Normocephalic and atraumatic.     Right Ear: External ear normal.     Left Ear: External ear normal.     Nose: Nose normal. No congestion.     Mouth/Throat:     Mouth: Mucous membranes are moist.     Pharynx: Oropharynx is clear. No oropharyngeal exudate.  Eyes:     General: No scleral icterus.    Extraocular Movements: Extraocular movements intact.     Conjunctiva/sclera: Conjunctivae normal.     Pupils: Pupils are equal, round, and reactive to light.  Cardiovascular:      Rate and Rhythm: Normal rate and regular rhythm.     Pulses: Normal pulses.     Heart sounds: No murmur heard.    No friction rub. No gallop.  Pulmonary:     Effort: Pulmonary effort is normal. No respiratory distress.     Breath sounds: Normal breath sounds. No wheezing or rales.  Abdominal:     General: Abdomen is flat. There is no distension.     Palpations: Abdomen is soft.  Musculoskeletal:        General: Tenderness present. Normal range of motion.     Cervical back: Normal range of motion. No tenderness.     Right lower leg: Edema present.     Left lower leg: Edema present.     Comments: Right wrist tenderness on palpation, flexion, extension  Lymphadenopathy:     Cervical: No cervical adenopathy.  Skin:    General: Skin is warm.     Coloration: Skin is not jaundiced or pale.  Neurological:     General: No focal deficit present.     Mental Status: She is alert and oriented to person, place, and time. Mental status is at baseline.     Cranial Nerves: No cranial nerve deficit.     Motor: No weakness.  Psychiatric:        Mood and Affect: Mood normal.        Behavior: Behavior normal.        Thought Content: Thought content normal.      No results found for any visits on 07/23/23.  Last CBC Lab Results  Component Value Date   WBC 7.8 02/23/2022   HGB 11.2 02/23/2022   HCT 35.4 02/23/2022   MCV 83 02/23/2022   MCH 26.3 (L) 02/23/2022   RDW 14.9 02/23/2022   PLT 303 02/23/2022   Last metabolic panel Lab Results  Component Value Date   GLUCOSE 81 02/23/2022   NA 141 02/23/2022   K 4.2 02/23/2022   CL 98 02/23/2022   CO2 26 02/23/2022   BUN 13 02/23/2022   CREATININE 0.76 02/23/2022   EGFR 79 02/23/2022   CALCIUM 9.5 02/23/2022   PHOS 2.7 11/05/2017   PROT 6.9 02/23/2022   ALBUMIN 3.5 (L) 02/23/2022   LABGLOB 3.4 02/23/2022   AGRATIO 1.0 (L) 02/23/2022   BILITOT 0.6 02/23/2022   ALKPHOS 91 02/23/2022   AST 24 02/23/2022   ALT 11 02/23/2022    ANIONGAP 5 10/29/2021   Last lipids Lab Results  Component Value Date   CHOL 142 02/23/2022   HDL 37 (L) 02/23/2022   LDLCALC 90 02/23/2022   TRIG 74 02/23/2022   CHOLHDL 3.8 02/23/2022  Last hemoglobin A1c Lab Results  Component Value Date   HGBA1C 6.1 (H) 02/23/2022   Last thyroid  functions Lab Results  Component Value Date   TSH 2.730 02/23/2022      The ASCVD Risk score (Arnett DK, et al., 2019) failed to calculate for the following reasons:   The 2019 ASCVD risk score is only valid for ages 17 to 19    Assessment & Plan:   Problem List Items Addressed This Visit       Cardiovascular and Mediastinum   Hypertension - Primary   Chronic, stable and good in the office today Continue HCTZ 25mg ; propranolol  at 80am/40pm        Relevant Orders   AMB Referral VBCI Care Management     Endocrine   Adult hypothyroidism   Last TSH slightly elevated and synthroid  dose was increased to 125 mcg. last TSH was 2.7 -Continue Synthroid  at current dose  -TSH ordered; adjust Synthroid  if necessary.      Relevant Orders   TSH     Other   Prediabetes   Low carbohydrate diet recommended. -Will check A1c      Relevant Orders   HgB A1c   AMB Referral VBCI Care Management   Hypercholesteremia   Last Lipid panel showed slightly decreased HDL with normal LDL. -Not currently on a statin -Recheck FLP and CMP       Relevant Orders   Comprehensive metabolic panel with GFR   Lipid Panel With LDL/HDL Ratio   Polypharmacy   Sherman is on multiple medications and would benefit from streamlining her medication regimen. We will get her in touch with pharmacy to see if they can assist with her management.      Relevant Orders   AMB Referral VBCI Care Management   Current use of long term anticoagulation   Check CBC to assess for any abnormalities      Relevant Orders   CBC w/Diff/Platelet   Compulsive behavior   Currently doing better with this according to son; does not  feel any different herself and is happy to continue with her current meds. -continue Luvox       Falls   Patient is working with home PT which she feels is improving her strength, she is going to continue to have Pt to work on her standing stamina and stability. -continue home PT      Pressure injury of right buttock, stage 1   Pressure injury is improving with home nursing care and regular dressing changes. -continue home nursing care      Pressure injury of left buttock, stage 1   Pressure injury is improving with home nursing care and regular dressing changes. -continue home nursing care      Decreased activities of daily living (ADL)   Patient has been seeing home OT and has done well. She has been discharged from OT following completion of her program.       Return in about 3 months (around 10/23/2023) for chronic disease f/u.    Monda Angry, Medical Student  I personally spent a total of 48 minutes in the care of the patient today including preparing to see the patient, getting/reviewing separately obtained history, performing a medically appropriate exam/evaluation, counseling and educating, placing orders, referring and communicating with other health care professionals, documenting clinical information in the EHR, and coordinating care.   Patient seen along with MS3 student, Luther Saltness. I personally evaluated this patient along with the student, and verified all  aspects of the history, physical exam, and medical decision making as documented by the student. I agree with the student's documentation and have made all necessary edits.  Tryce Surratt, Stan Eans, MD, MPH [facility]

## 2023-07-23 NOTE — Assessment & Plan Note (Signed)
 Check CBC to assess for any abnormalities

## 2023-07-23 NOTE — Telephone Encounter (Signed)
Verbals given  

## 2023-07-23 NOTE — Assessment & Plan Note (Signed)
 Glenda Williams is on multiple medications and would benefit from streamlining her medication regimen. We will get her in touch with pharmacy to see if they can assist with her management.

## 2023-07-23 NOTE — Assessment & Plan Note (Signed)
 Low carbohydrate diet recommended. -Will check A1c

## 2023-07-23 NOTE — Assessment & Plan Note (Signed)
 Patient has been seeing home OT and has done well. She has been discharged from OT following completion of her program.

## 2023-07-23 NOTE — Assessment & Plan Note (Signed)
 Patient is working with home PT which she feels is improving her strength, she is going to continue to have Pt to work on her standing stamina and stability. -continue home PT

## 2023-07-23 NOTE — Assessment & Plan Note (Signed)
 Currently doing better with this according to son; does not feel any different herself and is happy to continue with her current meds. -continue Luvox 

## 2023-07-24 ENCOUNTER — Encounter (INDEPENDENT_AMBULATORY_CARE_PROVIDER_SITE_OTHER): Payer: Self-pay | Admitting: Vascular Surgery

## 2023-07-24 ENCOUNTER — Encounter (INDEPENDENT_AMBULATORY_CARE_PROVIDER_SITE_OTHER): Payer: Self-pay

## 2023-07-24 ENCOUNTER — Ambulatory Visit (INDEPENDENT_AMBULATORY_CARE_PROVIDER_SITE_OTHER): Admitting: Vascular Surgery

## 2023-07-24 VITALS — BP 122/77 | HR 56 | Resp 16

## 2023-07-24 DIAGNOSIS — I10 Essential (primary) hypertension: Secondary | ICD-10-CM | POA: Diagnosis not present

## 2023-07-24 DIAGNOSIS — I83893 Varicose veins of bilateral lower extremities with other complications: Secondary | ICD-10-CM | POA: Diagnosis not present

## 2023-07-24 DIAGNOSIS — Z7409 Other reduced mobility: Secondary | ICD-10-CM

## 2023-07-24 DIAGNOSIS — I38 Endocarditis, valve unspecified: Secondary | ICD-10-CM

## 2023-07-24 DIAGNOSIS — M7989 Other specified soft tissue disorders: Secondary | ICD-10-CM | POA: Diagnosis not present

## 2023-07-24 DIAGNOSIS — M069 Rheumatoid arthritis, unspecified: Secondary | ICD-10-CM | POA: Diagnosis not present

## 2023-07-24 NOTE — Progress Notes (Signed)
 Patient ID: Glenda Williams, female   DOB: 1940/04/09, 83 y.o.   MRN: [id number]  Chief Complaint  Patient presents with   New Patient (Initial Visit)    NP . consult. leg swelling. lymph pump consideration. [place].    HPI Glenda Williams is a 83 y.o. female.  I am asked to see the patient by Dr. Beau Bound for evaluation of leg swelling.  She has a litany of medical issues as listed below and is had progressive swelling over several years time.  She does have a previous history of vein stripping many years ago.  No known history of DVT or superficial thrombophlebitis to her knowledge.  Both legs are affected both legs, also developed more prominent varicosities over the past few years.  She is on anticoagulation for atrial fibrillation, she is worried about bleeding from these large prominent varicosities.  Both legs are affected.  She has worn compression socks without significant improvement.  She does try to elevate her legs.  Her mobility has become quite poor and this seems to correlate with the progression of her swelling.  She also has significant arthritis.   Past Medical History:  Diagnosis Date   A-fib [facility])    Arthritis    Bell palsy 09/04/2014   Congestion of nasal sinus 02/14/2023   Cough with sputum 02/14/2023   GERD (gastroesophageal reflux disease)    Heart disease    Rheumatoid arthritis (HCC)    Rhinosinusitis 02/14/2023   Thyroid  disease    Tremors of nervous system     Past Surgical History:  Procedure Laterality Date   BREAST BIOPSY Left 1988   Benign   BREAST BIOPSY Left 1987   Benign   BREAST EXCISIONAL BIOPSY     BROW LIFT Bilateral 05/23/2016   Procedure: BLEPHAROPLASTY upper eyelid; w/excess skin;  Surgeon: Zacarias Hermann, MD;  Location: [facility];  Service: Ophthalmology;  Laterality: Bilateral;   CARPAL TUNNEL RELEASE Bilateral    CHOLECYSTECTOMY     Dr. Marquita Situ   COLONOSCOPY  2006   JOINT REPLACEMENT Bilateral    knees   REPLACEMENT  TOTAL KNEE Left 2005   REPLACEMENT TOTAL KNEE Right 1998     Family History  Problem Relation Age of Onset   Stroke Mother    Hypertension Mother    Heart disease Father    Arthritis Father    Parkinson's disease Sister    Parkinson's disease Brother    Bladder Cancer Brother    Breast cancer Neg Hx    Kidney cancer Neg Hx      Social History   Tobacco Use   Smoking status: Never   Smokeless tobacco: Never  Vaping Use   Vaping status: Never Used  Substance Use Topics   Alcohol use: No   Drug use: No     Allergies  Allergen Reactions   Bactrim  [Sulfamethoxazole -Trimethoprim ] Anaphylaxis   Macrobid  [Nitrofurantoin  Macrocrystal] Rash   Mirabegron  Rash   Oxybutynin  Rash   Penicillins Rash    Has patient had a PCN reaction causing immediate rash, facial/tongue/throat swelling, SOB or lightheadedness with hypotension: Yes Has patient had a PCN reaction causing severe rash involving mucus membranes or skin necrosis: No Has patient had a PCN reaction that required hospitalization: No Has patient had a PCN reaction occurring within the last 10 years: Unknown If all of the above answers are "NO", then may proceed with Cephalosporin use.     Current Outpatient Medications  Medication Sig Dispense  Refill   albuterol  (VENTOLIN  HFA) 108 (90 Base) MCG/ACT inhaler Inhale 1-2 puffs into the lungs every 4 (four) hours as needed for wheezing or shortness of breath. 26.8 g 3   azelastine  (ASTELIN ) 0.1 % nasal spray Place 2 sprays into both nostrils 2 (two) times daily. Use in each nostril as directed 30 mL 12   benzonatate  (TESSALON ) 100 MG capsule Take 1-2 capsules (100-200 mg total) by mouth 3 (three) times daily as needed for cough. 30 capsule 0   cetirizine (ZYRTEC) 10 MG tablet Take 10 mg by mouth daily.     cholecalciferol  (VITAMIN D ) 400 units TABS tablet Take 400 Units by mouth daily.     conjugated estrogens  (PREMARIN ) vaginal cream Apply 0.5mg  (pea-sized amount)  just  inside the vaginal introitus with a finger-tip on  Monday, Wednesday and Friday nights. 30 g 12   EPINEPHrine  0.3 mg/0.3 mL IJ SOAJ injection Inject 0.3 mLs into the skin once as needed for anaphylaxis.  1   fluvoxaMINE  (LUVOX ) 50 MG tablet Take 3 tablets (150 mg total) by mouth at bedtime. 90 tablet 5   folic acid  (FOLVITE ) 1 MG tablet Take 1 mg by mouth daily.     gabapentin  (NEURONTIN ) 100 MG capsule Take 1 capsule (100 mg total) by mouth 2 (two) times daily. 180 capsule 1   golimumab  (SIMPONI  ARIA) 50 MG/4ML SOLN injection Inject into the vein.     guaiFENesin  200 MG tablet Take 1 tablet (200 mg total) by mouth every 4 (four) hours as needed for cough or to loosen phlegm. 30 suppository 0   hydrochlorothiazide  (HYDRODIURIL ) 25 MG tablet Take 25 mg by mouth daily.     hydrocortisone  cream 1 % Apply 1 application  topically as needed for itching.     ketoconazole  (NIZORAL ) 2 % cream Apply 1 application topically daily. 30 g 2   levothyroxine  (SYNTHROID ) 125 MCG tablet Take 1 tablet (125 mcg total) by mouth daily. 90 tablet 1   methotrexate  (RHEUMATREX) 2.5 MG tablet Take 20 mg by mouth every Thursday.     NYSTATIN  powder APPLY TOPICALLY TO SKIN FOLDS THREE TIMES DAILY 60 g 1   Omega-3 Fatty Acids (FISH OIL) 1000 MG CAPS Take 1 capsule by mouth daily.     omeprazole  (PRILOSEC) 20 MG capsule TAKE 1 CAPSULE BY MOUTH TWICE  DAILY BEFORE MEALS 180 capsule 3   potassium chloride  SA (K-DUR,KLOR-CON ) 20 MEQ tablet Take 1 tablet by mouth  daily 90 tablet 1   predniSONE  (DELTASONE ) 20 MG tablet 2 tabs poqday 1-3, 1 tabs poqday 4-6 9 tablet 0   propranolol  (INDERAL ) 40 MG tablet 80 mg in the am and 40 mg at night     Red Yeast Rice 600 MG CAPS Take 1 capsule by mouth daily.     rivaroxaban  (XARELTO ) 20 MG TABS tablet Take 1 tablet (20 mg total) by mouth daily with supper. Please report any side effects to R leg following starting including worsening of bruise, new/changed edema, color variation or  temperature change. 30 tablet 5   torsemide (DEMADEX) 20 MG tablet Take 20 mg by mouth. Patient states she takes 1 tablet per day as needed     No current facility-administered medications for this visit.      REVIEW OF SYSTEMS (Negative unless checked)  Constitutional: [] Weight loss  [] Fever  [] Chills Cardiac: [] Chest pain   [] Chest pressure   [] Palpitations   [] Shortness of breath when laying flat   [] Shortness of breath at rest   []   Shortness of breath with exertion. Vascular:  [] Pain in legs with walking   [] Pain in legs at rest   [] Pain in legs when laying flat   [] Claudication   [] Pain in feet when walking  [] Pain in feet at rest  [] Pain in feet when laying flat   [] History of DVT   [] Phlebitis   [x] Swelling in legs   [x] Varicose veins   [] Non-healing ulcers Pulmonary:   [] Uses home oxygen   [] Productive cough   [] Hemoptysis   [] Wheeze  [] COPD   [] Asthma Neurologic:  [] Dizziness  [] Blackouts   [] Seizures   [] History of stroke   [] History of TIA  [] Aphasia   [] Temporary blindness   [] Dysphagia   [] Weakness or numbness in arms   [] Weakness or numbness in legs Musculoskeletal:  [x] Arthritis   [] Joint swelling   [x] Joint pain   [] Low back pain Hematologic:  [] Easy bruising  [] Easy bleeding   [] Hypercoagulable state   [] Anemic  [] Hepatitis Gastrointestinal:  [] Blood in stool   [] Vomiting blood  [x] Gastroesophageal reflux/heartburn   [] Abdominal pain Genitourinary:  [] Chronic kidney disease   [] Difficult urination  [] Frequent urination  [] Burning with urination   [] Hematuria Skin:  [] Rashes   [] Ulcers   [] Wounds Psychological:  [] History of anxiety   []  History of major depression.    Physical Exam BP 122/77   Pulse (!) 56   Resp 16  Gen:  WD/WN, NAD Head: [place]/AT, No temporalis wasting.  Ear/Nose/Throat: Hearing grossly intact, nares w/o erythema or drainage, oropharynx w/o Erythema/Exudate Eyes: Conjunctiva clear, sclera non-icteric  Neck: trachea midline.  No JVD.  Pulmonary:  Good  air movement, respirations not labored, no use of accessory muscles  Cardiac: irregular, bradycardic Vascular:  Vessel Right Left  Radial Palpable Palpable                                   Gastrointestinal:. No masses, surgical incisions, or scars. Musculoskeletal: M/S 5/5 throughout.  Extremities without ischemic changes.  RA changes present. In a wheelchair,  1+ RLE edema, 1-2+ LLE edema.  Prominent stasis changes with prominent varicosities present bilaterally Neurologic: Sensation grossly intact in extremities.  Symmetrical.  Speech is fluent. Motor exam as listed above. Psychiatric: Judgment intact, Mood & affect appropriate for pt's clinical situation. Dermatologic: No rashes or ulcers noted.  No cellulitis or open wounds.    Radiology No results found.  Labs No results found for this or any previous visit (from the past 2160 hours).  Assessment/Plan:  Varicose veins of leg with swelling, bilateral  Recommend:  The patient has large symptomatic varicose veins that are painful and associated with swelling. The patient is CEAP C4sEpAsPr   I have had a long discussion with the patient regarding  varicose veins and why they cause symptoms.  Patient will begin wearing graduated compression stockings class 1 on a daily basis, beginning first thing in the morning and removing them in the evening. The patient is instructed specifically not to sleep in the stockings.    The patient  will also begin using over-the-counter analgesics such as Motrin 600 mg po TID to help control the symptoms.    In addition, behavioral modification including elevation during the day will be initiated.    Pending the results of these changes the  patient will be reevaluated in three months.   An ultrasound of the venous system will be obtained.   Further plans will be based  on the ultrasound results and whether conservative therapies are successful at eliminating the pain and swelling.    Swelling of limb Recommend:  I have had a long discussion with the patient regarding swelling and why it  causes symptoms.  Patient will begin wearing graduated compression on a daily basis a prescription was given. The patient will  wear the stockings first thing in the morning and removing them in the evening. The patient is instructed specifically not to sleep in the stockings.   In addition, behavioral modification will be initiated.  This will include frequent elevation, use of over the counter pain medications and exercise such as walking.  Consideration for a lymph pump will also be made based upon the effectiveness of conservative therapy.  This would help to improve the edema control and prevent sequela such as ulcers and infections   Patient should undergo duplex ultrasound of the venous system to ensure that DVT or reflux is not present.  The patient will follow-up with me after the ultrasound.   Poor mobility Poor mobility certainly worsens lower extremity swelling.  Rheumatoid arthritis (HCC) Her arthritis and poor mobility can certainly worsen her lower extremity swelling  HTN (hypertension) blood pressure control important in reducing the progression of atherosclerotic disease. On appropriate oral medications.   Valvular heart disease Cardiac dysfunction particularly MR and TR can create worsening swelling.      Mikki Alexander 07/27/2023, 9:14 AM   This note was created with Dragon medical transcription system.  Any errors from dictation are unintentional.

## 2023-07-26 ENCOUNTER — Telehealth: Payer: Self-pay

## 2023-07-26 DIAGNOSIS — M792 Neuralgia and neuritis, unspecified: Secondary | ICD-10-CM | POA: Diagnosis not present

## 2023-07-26 DIAGNOSIS — L89312 Pressure ulcer of right buttock, stage 2: Secondary | ICD-10-CM | POA: Diagnosis not present

## 2023-07-26 DIAGNOSIS — M0579 Rheumatoid arthritis with rheumatoid factor of multiple sites without organ or systems involvement: Secondary | ICD-10-CM | POA: Diagnosis not present

## 2023-07-26 DIAGNOSIS — R052 Subacute cough: Secondary | ICD-10-CM | POA: Diagnosis not present

## 2023-07-26 DIAGNOSIS — J301 Allergic rhinitis due to pollen: Secondary | ICD-10-CM | POA: Diagnosis not present

## 2023-07-26 DIAGNOSIS — N3946 Mixed incontinence: Secondary | ICD-10-CM | POA: Diagnosis not present

## 2023-07-26 NOTE — Progress Notes (Signed)
 Care Guide Pharmacy Note  07/26/2023 Name: Glenda Williams MRN: [id number] DOB: May 27, 1940  Referred By: Mazie Speed, MD Reason for referral: Complex Care Management (Outreach to schedule with Pharm d )   Glenda Williams is a 83 y.o. year old female who is a primary care patient of Bacigalupo, Angela M, MD.  [name] was referred to the pharmacist for assistance related to: Atrial Fibrillation and HTN  Successful contact was made with the patient to discuss pharmacy services including being ready for the pharmacist to call at least 5 minutes before the scheduled appointment time and to have medication bottles and any blood pressure readings ready for review. The patient agreed to meet with the pharmacist via telephone visit on (date/time).08/21/2023  Lenton Rail , RMA     [place]  [facility], [facility] Guide  Direct Dial: [phone]  Website: [email or web address]

## 2023-07-27 DIAGNOSIS — M7989 Other specified soft tissue disorders: Secondary | ICD-10-CM | POA: Insufficient documentation

## 2023-07-27 DIAGNOSIS — I83893 Varicose veins of bilateral lower extremities with other complications: Secondary | ICD-10-CM | POA: Insufficient documentation

## 2023-07-27 NOTE — Assessment & Plan Note (Signed)
 Cardiac dysfunction particularly MR and TR can create worsening swelling.

## 2023-07-27 NOTE — Assessment & Plan Note (Signed)

## 2023-07-27 NOTE — Assessment & Plan Note (Signed)
 Her arthritis and poor mobility can certainly worsen her lower extremity swelling

## 2023-07-27 NOTE — Assessment & Plan Note (Signed)
Recommend:  The patient has large symptomatic varicose veins that are painful and associated with swelling. The patient is CEAP C4sEpAsPr   I have had a long discussion with the patient regarding  varicose veins and why they cause symptoms.  Patient will begin wearing graduated compression stockings class 1 on a daily basis, beginning first thing in the morning and removing them in the evening. The patient is instructed specifically not to sleep in the stockings.    The patient  will also begin using over-the-counter analgesics such as Motrin 600 mg po TID to help control the symptoms.    In addition, behavioral modification including elevation during the day will be initiated.    Pending the results of these changes the  patient will be reevaluated in three months.   An ultrasound of the venous system will be obtained.   Further plans will be based on the ultrasound results and whether conservative therapies are successful at eliminating the pain and swelling.  

## 2023-07-27 NOTE — Assessment & Plan Note (Signed)
 blood pressure control important in reducing the progression of atherosclerotic disease. On appropriate oral medications.

## 2023-07-27 NOTE — Assessment & Plan Note (Signed)
 Poor mobility certainly worsens lower extremity swelling.

## 2023-07-31 ENCOUNTER — Other Ambulatory Visit: Payer: Self-pay

## 2023-07-31 DIAGNOSIS — R052 Subacute cough: Secondary | ICD-10-CM | POA: Diagnosis not present

## 2023-07-31 DIAGNOSIS — J301 Allergic rhinitis due to pollen: Secondary | ICD-10-CM | POA: Diagnosis not present

## 2023-07-31 DIAGNOSIS — N3946 Mixed incontinence: Secondary | ICD-10-CM | POA: Diagnosis not present

## 2023-07-31 DIAGNOSIS — M792 Neuralgia and neuritis, unspecified: Secondary | ICD-10-CM | POA: Diagnosis not present

## 2023-07-31 DIAGNOSIS — L89312 Pressure ulcer of right buttock, stage 2: Secondary | ICD-10-CM | POA: Diagnosis not present

## 2023-07-31 DIAGNOSIS — M0579 Rheumatoid arthritis with rheumatoid factor of multiple sites without organ or systems involvement: Secondary | ICD-10-CM | POA: Diagnosis not present

## 2023-07-31 NOTE — Patient Instructions (Signed)
 Visit Information  Thank you for taking time to visit with me today. Please don't hesitate to contact me if I can be of assistance to you before our next scheduled appointment.  Your next care management appointment is by telephone on 08/21/23 at 9:30 am  Telephone follow-up in 1 month with Michele Ahle, RN case manager  Please call the care guide team at [phone] if you need to cancel, schedule, or reschedule an appointment.   Please call the Suicide and Crisis Lifeline: 988 call 1-800-273-TALK (toll free, 24 hour hotline) if you are experiencing a Mental Health or Behavioral Health Crisis or need someone to talk to.  Verba Girt RN, BSN, CCM [employer], Population Health Case Manager Phone: [phone]

## 2023-07-31 NOTE — Patient Outreach (Signed)
 Complex Care Management   Visit Note  07/31/2023  Name:  Glenda Williams MRN: [id number] DOB: 10/29/1940  Situation: Referral received for Complex Care Management related to Rheumatoid arthritis I obtained verbal consent from Patient.  Visit completed with patient  on the phone  Background:   Past Medical History:  Diagnosis Date   A-fib [facility])    Arthritis    Bell palsy 09/04/2014   Congestion of nasal sinus 02/14/2023   Cough with sputum 02/14/2023   GERD (gastroesophageal reflux disease)    Heart disease    Rheumatoid arthritis (HCC)    Rhinosinusitis 02/14/2023   Thyroid  disease    Tremors of nervous system     Assessment: Patient Reported Symptoms:  Cognitive Cognitive Status: Alert and oriented to person, place, and time, Insightful and able to interpret abstract concepts, Normal speech and language skills      Neurological Neurological Review of Symptoms: No symptoms reported    HEENT HEENT Symptoms Reported: No symptoms reported      Cardiovascular Cardiovascular Symptoms Reported: No symptoms reported    Respiratory Respiratory Symptoms Reported: No symptoms reported    Endocrine Patient reports the following symptoms related to hypoglycemia or hyperglycemia : No symptoms reported    Gastrointestinal Gastrointestinal Symptoms Reported: No symptoms reported      Genitourinary Genitourinary Symptoms Reported: Incontinence Genitourinary Conditions: Incontinence Genitourinary Management Strategies: Incontinence garment/pad  Integumentary Integumentary Symptoms Reported: Other Other Integumentary Symptoms: buttock pressure sores Additional Integumentary Details: Patient reports buttock pressure sores have healed. She states the home health nurse applied Medicated pads to the areas. Skin Conditions: Wound Skin Management Strategies: Routine screening (application of medicated pad) Skin Self-Management Outcome: 4 (good)  Musculoskeletal Musculoskelatal Symptoms  Reviewed: Difficulty walking, Unsteady gait Additional Musculoskeletal Details: Patient states she needs to have a transport wheelchair, potty chair and shower chair.  She reports mentioning this to her physical therapist however unsure if it was addressed. Musculoskeletal Conditions: Mobility limited, Unsteady gait, Rheumatoid arthritis Musculoskeletal Management Strategies: Routine screening, Medical device Musculoskeletal Comment: Patient reports ongoing home health PT follow up.  Denies any recent falls. Patient states she feels the therapy is helping.  She reports history of bilateral knee replacement. States her knees are doing fine however she has issues with chronic back pain.  Patient states she had a recent steroid injection in her back. Reports having some pain relief. Patient reports having rheumatoid infusion 2 weeks ago.      Psychosocial Psychosocial Symptoms Reported: No symptoms reported            07/23/2023   11:08 AM  Depression screen PHQ 2/9  Decreased Interest 0  Down, Depressed, Hopeless 0  PHQ - 2 Score 0  Altered sleeping 2  Tired, decreased energy 1  Change in appetite 0  Feeling bad or failure about yourself  0  Trouble concentrating 0  Moving slowly or fidgety/restless 1  Suicidal thoughts 0  PHQ-9 Score 4  Difficult doing work/chores Somewhat difficult    Vitals:   07/31/23 1421 07/31/23 1422  BP: 137/60 (!) 120/57    Medications Reviewed Today     Reviewed by Zaiden Ludlum E, RN (Registered Nurse) on 07/31/23 at 1420  Med List Status: <None>   Medication Order Taking? Sig Documenting Provider Last Dose Status Informant  albuterol  (VENTOLIN  HFA) 108 (90 Base) MCG/ACT inhaler [id number] Yes Inhale 1-2 puffs into the lungs every 4 (four) hours as needed for wheezing or shortness of breath. Tapia, Leisa, PA-C Taking Active  azelastine  (ASTELIN ) 0.1 % nasal spray [id number] Yes Place 2 sprays into both nostrils 2 (two) times daily. Use in each nostril  as directed Tasia Farr, FNP Taking Active   benzonatate  (TESSALON ) 100 MG capsule [id number] No Take 1-2 capsules (100-200 mg total) by mouth 3 (three) times daily as needed for cough.  Patient not taking: Reported on 07/31/2023   Tapia, Leisa, PA-C Not Taking Active   cetirizine (ZYRTEC) 10 MG tablet [id number] Yes Take 10 mg by mouth daily. [provider] Taking Active Self, Child, Pharmacy Records           Med Note Marrie Sizer, Kerwin Augustus E   Wed May 30, 2023  4:16 PM) Patient states taking every day  cholecalciferol  (VITAMIN D ) 400 units TABS tablet [id number] Yes Take 400 Units by mouth daily. [provider] Taking Active Self, Child, Pharmacy Records  conjugated estrogens  (PREMARIN ) vaginal cream [id number] Yes Apply 0.5mg  (pea-sized amount)  just inside the vaginal introitus with a finger-tip on  Monday, Wednesday and Friday nights. Oda Bence, PA-C Taking Active Self, Child, Pharmacy Records  EPINEPHrine  0.3 mg/0.3 mL IJ SOAJ injection [id number] Yes Inject 0.3 mLs into the skin once as needed for anaphylaxis. [provider] Taking Active Self, Child, Pharmacy Records  fluvoxaMINE  (LUVOX ) 50 MG tablet [id number] Yes Take 3 tablets (150 mg total) by mouth at bedtime. Mazie Speed, MD Taking Active   folic acid  (FOLVITE ) 1 MG tablet [id number] Yes Take 1 mg by mouth daily. [provider] Taking Active Self, Child, Pharmacy Records  gabapentin  (NEURONTIN ) 100 MG capsule [id number] Yes Take 1 capsule (100 mg total) by mouth 2 (two) times daily. Mazie Speed, MD Taking Active   golimumab  (SIMPONI  ARIA) 50 MG/4ML SOLN injection [id number] Yes Inject into the vein. [provider] Taking Active Self  guaiFENesin  200 MG tablet [id number] No Take 1 tablet (200 mg total) by mouth every 4 (four) hours as needed for cough or to loosen phlegm.  Patient not taking: Reported on 07/31/2023   Tasia Farr, FNP Not Taking Active    hydrochlorothiazide  (HYDRODIURIL ) 25 MG tablet [id number] Yes Take 25 mg by mouth daily. [provider] Taking Active Self, Child, Pharmacy Records  hydrocortisone  cream 1 % [id number] Yes Apply 1 application  topically as needed for itching. [provider] Taking Active Self, Child, Pharmacy Records  ketoconazole  (NIZORAL ) 2 % cream [id number] Yes Apply 1 application topically daily. Mazie Speed, MD Taking Active Self, Child, Pharmacy Records           Med Note Marrie Sizer, [facility] E   Tue Jul 31, 2023  1:57 PM) Patient states uses as needed.   levothyroxine  (SYNTHROID ) 125 MCG tablet [id number] Yes Take 1 tablet (125 mcg total) by mouth daily. Mazie Speed, MD Taking Active   methotrexate  [facility]) 2.5 MG tablet [id number] Yes Take 20 mg by mouth every Thursday. [provider] Taking Active Self, Child, Pharmacy Records           Med Note Coye Diver Nov 04, 2017 12:52 PM)    NYSTATIN  powder [id number] Yes APPLY TOPICALLY TO SKIN FOLDS THREE TIMES DAILY Normie Becton, FNP Taking Active   Omega-3 Fatty Acids (FISH OIL) 1000 MG CAPS [id number] Yes Take 1 capsule by mouth daily. [provider] Taking Active Self, Child, Pharmacy Records  omeprazole  (PRILOSEC) 20 MG capsule [id number] Yes TAKE 1 CAPSULE BY MOUTH TWICE  DAILY BEFORE MEALS Bacigalupo, Stan Eans, MD  Taking Active   potassium chloride  SA (K-DUR,KLOR-CON ) 20 MEQ tablet [id number] Yes Take 1 tablet by mouth  daily Woody Heading, MD Taking Active Self, Child, Pharmacy Records  predniSONE  (DELTASONE ) 20 MG tablet [id number] No 2 tabs poqday 1-3, 1 tabs poqday 4-6  Patient not taking: Reported on 07/31/2023   Tapia, Leisa, PA-C Not Taking Active   propranolol  (INDERAL ) 40 MG tablet [id number] Yes 80 mg in the am and 40 mg at night [provider] Taking Active Self, Child, Pharmacy Records  Red Yeast Rice 600 MG CAPS [id number] Yes Take 1 capsule by mouth daily. [provider] Taking Active Self, Child, Pharmacy Records  rivaroxaban  (XARELTO ) 20 MG TABS tablet [id number] Yes Take 1 tablet (20 mg total) by mouth daily with supper. Please report any side effects to R leg following starting including worsening of bruise, new/changed edema, color variation or temperature change. Normie Becton, FNP Taking Active   torsemide (DEMADEX) 20 MG tablet [id number] Yes Take 20 mg by mouth. Patient states she takes 1 tablet per day as needed [provider] Taking Active Self            Recommendation:   PCP Follow-up  Follow Up Plan:   Telephone follow-up in 1 month. Patient is being transferred to Michele Ahle, RN case manager for ongoing follow up.  Patient verbally agreed with transfer and ongoing case manager.   Verba Girt RN, BSN, CCM [employer], Population Health Case Manager Phone: [phone]

## 2023-08-01 DIAGNOSIS — M792 Neuralgia and neuritis, unspecified: Secondary | ICD-10-CM | POA: Diagnosis not present

## 2023-08-01 DIAGNOSIS — L89312 Pressure ulcer of right buttock, stage 2: Secondary | ICD-10-CM | POA: Diagnosis not present

## 2023-08-01 DIAGNOSIS — R052 Subacute cough: Secondary | ICD-10-CM | POA: Diagnosis not present

## 2023-08-01 DIAGNOSIS — J301 Allergic rhinitis due to pollen: Secondary | ICD-10-CM | POA: Diagnosis not present

## 2023-08-01 DIAGNOSIS — M0579 Rheumatoid arthritis with rheumatoid factor of multiple sites without organ or systems involvement: Secondary | ICD-10-CM | POA: Diagnosis not present

## 2023-08-01 DIAGNOSIS — N3946 Mixed incontinence: Secondary | ICD-10-CM | POA: Diagnosis not present

## 2023-08-02 DIAGNOSIS — N3946 Mixed incontinence: Secondary | ICD-10-CM | POA: Diagnosis not present

## 2023-08-02 DIAGNOSIS — F429 Obsessive-compulsive disorder, unspecified: Secondary | ICD-10-CM | POA: Diagnosis not present

## 2023-08-02 DIAGNOSIS — M0579 Rheumatoid arthritis with rheumatoid factor of multiple sites without organ or systems involvement: Secondary | ICD-10-CM | POA: Diagnosis not present

## 2023-08-02 DIAGNOSIS — E559 Vitamin D deficiency, unspecified: Secondary | ICD-10-CM | POA: Diagnosis not present

## 2023-08-02 DIAGNOSIS — L89312 Pressure ulcer of right buttock, stage 2: Secondary | ICD-10-CM | POA: Diagnosis not present

## 2023-08-02 DIAGNOSIS — J301 Allergic rhinitis due to pollen: Secondary | ICD-10-CM | POA: Diagnosis not present

## 2023-08-02 DIAGNOSIS — I5032 Chronic diastolic (congestive) heart failure: Secondary | ICD-10-CM | POA: Diagnosis not present

## 2023-08-02 DIAGNOSIS — M792 Neuralgia and neuritis, unspecified: Secondary | ICD-10-CM | POA: Diagnosis not present

## 2023-08-02 DIAGNOSIS — J22 Unspecified acute lower respiratory infection: Secondary | ICD-10-CM | POA: Diagnosis not present

## 2023-08-02 DIAGNOSIS — G250 Essential tremor: Secondary | ICD-10-CM | POA: Diagnosis not present

## 2023-08-02 DIAGNOSIS — I110 Hypertensive heart disease with heart failure: Secondary | ICD-10-CM | POA: Diagnosis not present

## 2023-08-02 DIAGNOSIS — R052 Subacute cough: Secondary | ICD-10-CM | POA: Diagnosis not present

## 2023-08-03 DIAGNOSIS — E7800 Pure hypercholesterolemia, unspecified: Secondary | ICD-10-CM | POA: Diagnosis not present

## 2023-08-03 DIAGNOSIS — E039 Hypothyroidism, unspecified: Secondary | ICD-10-CM | POA: Diagnosis not present

## 2023-08-03 DIAGNOSIS — Z7901 Long term (current) use of anticoagulants: Secondary | ICD-10-CM | POA: Diagnosis not present

## 2023-08-03 DIAGNOSIS — R7303 Prediabetes: Secondary | ICD-10-CM | POA: Diagnosis not present

## 2023-08-04 LAB — CBC WITH DIFFERENTIAL/PLATELET
Basophils Absolute: 0.1 10*3/uL (ref 0.0–0.2)
Basos: 1 %
EOS (ABSOLUTE): 0.3 10*3/uL (ref 0.0–0.4)
Eos: 3 %
Hematocrit: 33.4 % — ABNORMAL LOW (ref 34.0–46.6)
Hemoglobin: 10.2 g/dL — ABNORMAL LOW (ref 11.1–15.9)
Immature Grans (Abs): 0 10*3/uL (ref 0.0–0.1)
Immature Granulocytes: 0 %
Lymphocytes Absolute: 2.5 10*3/uL (ref 0.7–3.1)
Lymphs: 30 %
MCH: 26.7 pg (ref 26.6–33.0)
MCHC: 30.5 g/dL — ABNORMAL LOW (ref 31.5–35.7)
MCV: 87 fL (ref 79–97)
Monocytes Absolute: 0.7 10*3/uL (ref 0.1–0.9)
Monocytes: 9 %
Neutrophils Absolute: 4.8 10*3/uL (ref 1.4–7.0)
Neutrophils: 57 %
Platelets: 320 10*3/uL (ref 150–450)
RBC: 3.82 x10E6/uL (ref 3.77–5.28)
RDW: 14.5 % (ref 11.7–15.4)
WBC: 8.4 10*3/uL (ref 3.4–10.8)

## 2023-08-04 LAB — COMPREHENSIVE METABOLIC PANEL WITH GFR
ALT: 16 IU/L (ref 0–32)
AST: 23 IU/L (ref 0–40)
Albumin: 3.8 g/dL (ref 3.7–4.7)
Alkaline Phosphatase: 87 IU/L (ref 44–121)
BUN/Creatinine Ratio: 21 (ref 12–28)
BUN: 16 mg/dL (ref 8–27)
Bilirubin Total: 0.6 mg/dL (ref 0.0–1.2)
CO2: 27 mmol/L (ref 20–29)
Calcium: 9.6 mg/dL (ref 8.7–10.3)
Chloride: 100 mmol/L (ref 96–106)
Creatinine, Ser: 0.75 mg/dL (ref 0.57–1.00)
Globulin, Total: 3.4 g/dL (ref 1.5–4.5)
Glucose: 90 mg/dL (ref 70–99)
Potassium: 3.5 mmol/L (ref 3.5–5.2)
Sodium: 140 mmol/L (ref 134–144)
Total Protein: 7.2 g/dL (ref 6.0–8.5)
eGFR: 80 mL/min/{1.73_m2} (ref >59)

## 2023-08-04 LAB — LIPID PANEL WITH LDL/HDL RATIO
Cholesterol, Total: 133 mg/dL (ref 100–199)
HDL: 47 mg/dL (ref >39)
LDL Chol Calc (NIH): 73 mg/dL (ref 0–99)
LDL/HDL Ratio: 1.6 ratio (ref 0.0–3.2)
Triglycerides: 61 mg/dL (ref 0–149)
VLDL Cholesterol Cal: 13 mg/dL (ref 5–40)

## 2023-08-04 LAB — HEMOGLOBIN A1C
Est. average glucose Bld gHb Est-mCnc: 114 mg/dL
Hgb A1c MFr Bld: 5.6 % (ref 4.8–5.6)

## 2023-08-04 LAB — TSH: TSH: 3.44 u[IU]/mL (ref 0.450–4.500)

## 2023-08-06 ENCOUNTER — Ambulatory Visit: Admitting: Urology

## 2023-08-07 DIAGNOSIS — M0579 Rheumatoid arthritis with rheumatoid factor of multiple sites without organ or systems involvement: Secondary | ICD-10-CM | POA: Diagnosis not present

## 2023-08-07 DIAGNOSIS — M792 Neuralgia and neuritis, unspecified: Secondary | ICD-10-CM | POA: Diagnosis not present

## 2023-08-07 DIAGNOSIS — J301 Allergic rhinitis due to pollen: Secondary | ICD-10-CM | POA: Diagnosis not present

## 2023-08-07 DIAGNOSIS — R052 Subacute cough: Secondary | ICD-10-CM | POA: Diagnosis not present

## 2023-08-07 DIAGNOSIS — L89312 Pressure ulcer of right buttock, stage 2: Secondary | ICD-10-CM | POA: Diagnosis not present

## 2023-08-07 DIAGNOSIS — N3946 Mixed incontinence: Secondary | ICD-10-CM | POA: Diagnosis not present

## 2023-08-08 DIAGNOSIS — R052 Subacute cough: Secondary | ICD-10-CM | POA: Diagnosis not present

## 2023-08-08 DIAGNOSIS — J301 Allergic rhinitis due to pollen: Secondary | ICD-10-CM | POA: Diagnosis not present

## 2023-08-08 DIAGNOSIS — M0579 Rheumatoid arthritis with rheumatoid factor of multiple sites without organ or systems involvement: Secondary | ICD-10-CM | POA: Diagnosis not present

## 2023-08-08 DIAGNOSIS — N3946 Mixed incontinence: Secondary | ICD-10-CM | POA: Diagnosis not present

## 2023-08-08 DIAGNOSIS — L89312 Pressure ulcer of right buttock, stage 2: Secondary | ICD-10-CM | POA: Diagnosis not present

## 2023-08-08 DIAGNOSIS — M792 Neuralgia and neuritis, unspecified: Secondary | ICD-10-CM | POA: Diagnosis not present

## 2023-08-09 ENCOUNTER — Ambulatory Visit: Payer: Self-pay | Admitting: Family Medicine

## 2023-08-19 DIAGNOSIS — M1990 Unspecified osteoarthritis, unspecified site: Secondary | ICD-10-CM | POA: Diagnosis not present

## 2023-08-19 DIAGNOSIS — E669 Obesity, unspecified: Secondary | ICD-10-CM | POA: Diagnosis not present

## 2023-08-19 DIAGNOSIS — M792 Neuralgia and neuritis, unspecified: Secondary | ICD-10-CM | POA: Diagnosis not present

## 2023-08-19 DIAGNOSIS — R052 Subacute cough: Secondary | ICD-10-CM | POA: Diagnosis not present

## 2023-08-19 DIAGNOSIS — F429 Obsessive-compulsive disorder, unspecified: Secondary | ICD-10-CM | POA: Diagnosis not present

## 2023-08-19 DIAGNOSIS — I5032 Chronic diastolic (congestive) heart failure: Secondary | ICD-10-CM | POA: Diagnosis not present

## 2023-08-19 DIAGNOSIS — J301 Allergic rhinitis due to pollen: Secondary | ICD-10-CM | POA: Diagnosis not present

## 2023-08-19 DIAGNOSIS — G250 Essential tremor: Secondary | ICD-10-CM | POA: Diagnosis not present

## 2023-08-19 DIAGNOSIS — Z7409 Other reduced mobility: Secondary | ICD-10-CM | POA: Diagnosis not present

## 2023-08-19 DIAGNOSIS — Z6834 Body mass index (BMI) 34.0-34.9, adult: Secondary | ICD-10-CM | POA: Diagnosis not present

## 2023-08-19 DIAGNOSIS — I110 Hypertensive heart disease with heart failure: Secondary | ICD-10-CM | POA: Diagnosis not present

## 2023-08-19 DIAGNOSIS — M0579 Rheumatoid arthritis with rheumatoid factor of multiple sites without organ or systems involvement: Secondary | ICD-10-CM | POA: Diagnosis not present

## 2023-08-19 DIAGNOSIS — L89312 Pressure ulcer of right buttock, stage 2: Secondary | ICD-10-CM | POA: Diagnosis not present

## 2023-08-19 DIAGNOSIS — G629 Polyneuropathy, unspecified: Secondary | ICD-10-CM | POA: Diagnosis not present

## 2023-08-19 DIAGNOSIS — Z9181 History of falling: Secondary | ICD-10-CM | POA: Diagnosis not present

## 2023-08-19 DIAGNOSIS — E559 Vitamin D deficiency, unspecified: Secondary | ICD-10-CM | POA: Diagnosis not present

## 2023-08-19 DIAGNOSIS — F418 Other specified anxiety disorders: Secondary | ICD-10-CM | POA: Diagnosis not present

## 2023-08-19 DIAGNOSIS — K219 Gastro-esophageal reflux disease without esophagitis: Secondary | ICD-10-CM | POA: Diagnosis not present

## 2023-08-19 DIAGNOSIS — N3946 Mixed incontinence: Secondary | ICD-10-CM | POA: Diagnosis not present

## 2023-08-19 DIAGNOSIS — J22 Unspecified acute lower respiratory infection: Secondary | ICD-10-CM | POA: Diagnosis not present

## 2023-08-19 DIAGNOSIS — I4891 Unspecified atrial fibrillation: Secondary | ICD-10-CM | POA: Diagnosis not present

## 2023-08-19 DIAGNOSIS — Z7901 Long term (current) use of anticoagulants: Secondary | ICD-10-CM | POA: Diagnosis not present

## 2023-08-21 ENCOUNTER — Other Ambulatory Visit: Payer: Self-pay

## 2023-08-21 ENCOUNTER — Encounter: Payer: Self-pay | Admitting: Urology

## 2023-08-22 ENCOUNTER — Other Ambulatory Visit: Payer: Self-pay

## 2023-08-22 ENCOUNTER — Telehealth: Payer: Self-pay

## 2023-08-22 DIAGNOSIS — J301 Allergic rhinitis due to pollen: Secondary | ICD-10-CM | POA: Diagnosis not present

## 2023-08-22 DIAGNOSIS — M792 Neuralgia and neuritis, unspecified: Secondary | ICD-10-CM | POA: Diagnosis not present

## 2023-08-22 DIAGNOSIS — M0579 Rheumatoid arthritis with rheumatoid factor of multiple sites without organ or systems involvement: Secondary | ICD-10-CM | POA: Diagnosis not present

## 2023-08-22 DIAGNOSIS — R052 Subacute cough: Secondary | ICD-10-CM | POA: Diagnosis not present

## 2023-08-22 DIAGNOSIS — L89312 Pressure ulcer of right buttock, stage 2: Secondary | ICD-10-CM | POA: Diagnosis not present

## 2023-08-22 DIAGNOSIS — N3946 Mixed incontinence: Secondary | ICD-10-CM | POA: Diagnosis not present

## 2023-08-22 NOTE — Progress Notes (Signed)
 08/22/2023 Name: Glenda Williams MRN: [id number] DOB: 05-May-1940  Chief Complaint  Patient presents with   Medication Management    Glenda Williams is a 83 y.o. year old female who presented for a telephone visit.   They were referred to the pharmacist by their PCP for assistance in managing prediabetes, HTN, HLD, polypharmacy .    Subjective:  Care Team: Primary Care Provider: Myrla Jon HERO, MD ; Next Scheduled Visit: 09/03/2023   Medication Access/Adherence  Current Pharmacy:  [facility] - [place], [place] - [address] [facility] [address] [place] [address] Phone: [phone] Fax: [phone]  [id number] [place], [place] - [address] AT [facility] [address] [place] [address] Phone: [phone] Fax: [phone]  [facility] - [place], [place] - [address] [place] [address] Phone: [phone] Fax: [phone]   Patient reports affordability concerns with their medications: States that the cost of prescription and OTC medications add up and she would like to know if there are some medications that she could stop taking  Patient reports access/transportation concerns to their pharmacy: No  Patient reports adherence concerns with their medications:  No     Medication Review/ Polypharmacy:  [name] reports the purpose of the referral, per patient, was to review medications to de-prescribe due to her concern of polypharmacy. Patient has a PMH significant for hypertension, atrial fibrillation, prediabetes, and hyperlipidemia.    Patient states that she feels she needs to take omeprazole  due to recent scans that indicated worsening GERD. Per chart review, patient does not have recent images that show GERD symptoms and is not followed by a GI specialist.   Objective:  Lab Results  Component Value Date    HGBA1C 5.6 08/03/2023    Lab Results  Component Value Date   CREATININE 0.75 08/03/2023   BUN 16 08/03/2023   NA 140 08/03/2023   K 3.5 08/03/2023   CL 100 08/03/2023   CO2 27 08/03/2023    Lab Results  Component Value Date   CHOL 133 08/03/2023   HDL 47 08/03/2023   LDLCALC 73 08/03/2023   TRIG 61 08/03/2023   CHOLHDL 3.8 02/23/2022    Medications Reviewed Today     Reviewed by Cleatus Dorcas SAUNDERS, RPH (Pharmacist) on 08/22/23 at 1003  Med List Status: <None>   Medication Order Taking? Sig Documenting Provider Last Dose Status Informant  albuterol  (VENTOLIN  HFA) 108 (90 Base) MCG/ACT inhaler [id number] Yes Inhale 1-2 puffs into the lungs every 4 (four) hours as needed for wheezing or shortness of breath. Tapia, Leisa, PA-C  Active   azelastine  (ASTELIN ) 0.1 % nasal spray [id number] Yes Place 2 sprays into both nostrils 2 (two) times daily. Use in each nostril as directed  Patient taking differently: Place 2 sprays into both nostrils 2 (two) times daily as needed. Use in each nostril as directed   Wellington Curtis LABOR, FNP  Active   benzonatate  (TESSALON ) 100 MG capsule [id number]  Take 1-2 capsules (100-200 mg total) by mouth 3 (three) times daily as needed for cough.  Patient not taking: Reported on 08/22/2023   Tapia, Leisa, PA-C  Consider Medication Status and Discontinue (Completed Course)   cetirizine (ZYRTEC) 10 MG tablet [id number] Yes Take 10 mg by mouth daily.  Patient taking differently: Take 10 mg by mouth daily  as needed for allergies.   [provider]  Active Self, Child, Pharmacy Records           Med Note [name], [name]   Wed May 30, 2023  4:16 PM) Patient states taking every day  cholecalciferol  (VITAMIN D ) 400 units TABS tablet [id number] Yes Take 400 Units by mouth daily. [provider]  Active Self, Child, Pharmacy Records  conjugated estrogens  (PREMARIN ) vaginal cream [id number] Yes Apply 0.5mg  (pea-sized amount)  just inside the vaginal introitus  with a finger-tip on  Monday, Wednesday and Friday nights. Helon Clotilda LABOR, PA-C  Active Self, Child, Pharmacy Records  EPINEPHrine  0.3 mg/0.3 mL IJ SOAJ injection [id number] Yes Inject 0.3 mLs into the skin once as needed for anaphylaxis. [provider]  Active Self, Child, Pharmacy Records  fluvoxaMINE  (LUVOX ) 50 MG tablet [id number] Yes Take 3 tablets (150 mg total) by mouth at bedtime. Myrla Jon HERO, MD  Active   folic acid  (FOLVITE ) 1 MG tablet [id number] Yes Take 1 mg by mouth daily. [provider]  Active Self, Child, Pharmacy Records  gabapentin  (NEURONTIN ) 100 MG capsule [id number] Yes Take 1 capsule (100 mg total) by mouth 2 (two) times daily. Bacigalupo, Angela M, MD  Active   golimumab  (SIMPONI  ARIA) 50 MG/4ML SOLN injection [id number] Yes Inject into the vein. [provider]  Active Self  guaiFENesin  200 MG tablet [id number]  Take 1 tablet (200 mg total) by mouth every 4 (four) hours as needed for cough or to loosen phlegm.  Patient not taking: Reported on 08/22/2023   Wellington Curtis LABOR, FNP  Consider Medication Status and Discontinue (Completed Course)   hydrochlorothiazide  (HYDRODIURIL ) 25 MG tablet [id number] Yes Take 25 mg by mouth daily. [provider]  Active Self, Child, Pharmacy Records  hydrocortisone  cream 1 % [id number] Yes Apply 1 application  topically as needed for itching. [provider]  Active Self, Child, Pharmacy Records  ketoconazole  (NIZORAL ) 2 % cream [id number] Yes Apply 1 application topically daily. Myrla Jon HERO, MD  Active Self, Child, Pharmacy Records           Med Note [name], [facility] E   Tue Jul 31, 2023  1:57 PM) Patient states uses as needed.   ketoconazole  (NIZORAL ) 2 % shampoo [id number] Yes Apply topically. [provider]  Active   levothyroxine  (SYNTHROID ) 125 MCG tablet [id number] Yes Take 1 tablet (125 mcg total) by mouth daily. Myrla Jon HERO, MD  Active   methotrexate   [facility]) 2.5 MG tablet [id number] Yes Take 20 mg by mouth every Thursday. [provider]  Active Self, Child, Pharmacy Records           Med Note WORLEY LUCIANO GORMAN Austin Nov 04, 2017 12:52 PM)    NYSTATIN  powder [id number]  APPLY TOPICALLY TO SKIN FOLDS THREE TIMES DAILY  Patient not taking: Reported on 08/22/2023   Emilio Kelly DASEN, FNP  Consider Medication Status and Discontinue (Completed Course)   Omega-3 Fatty Acids (FISH OIL) 1000 MG CAPS [id number] Yes Take 1 capsule by mouth daily. [provider]  Active Self, Child, Pharmacy Records  omeprazole  (PRILOSEC) 20 MG capsule [id number] Yes TAKE 1 CAPSULE BY MOUTH TWICE  DAILY BEFORE MEALS Bacigalupo, Jon HERO, MD  Active   potassium chloride  SA (K-DUR,KLOR-CON ) 20 MEQ tablet [id number] Yes Take 1 tablet by mouth  daily Agapito Mom, MD  Active Self, Child, Pharmacy Records  predniSONE  (DELTASONE ) 20 MG tablet [id number]  2 tabs poqday 1-3, 1 tabs  poqday 4-6  Patient not taking: Reported on 08/22/2023   Tapia, Leisa, PA-C  Consider Medication Status and Discontinue (Completed Course)   propranolol  (INDERAL ) 40 MG tablet [id number] Yes 80 mg in the am and 40 mg at night [provider]  Active Self, Child, Pharmacy Records  Red Yeast Rice 600 MG CAPS [id number] Yes Take 1 capsule by mouth daily. [provider]  Active Self, Child, Pharmacy Records  rivaroxaban  (XARELTO ) 20 MG TABS tablet [id number] Yes Take 1 tablet (20 mg total) by mouth daily with supper. Please report any side effects to R leg following starting including worsening of bruise, new/changed edema, color variation or temperature change. Emilio Marseille T, FNP  Active   torsemide (DEMADEX) 20 MG tablet [id number] Yes Take 20 mg by mouth. Patient states she takes 1 tablet per day as needed [provider]  Active Self              Assessment/Plan:   Medication Review/ Polypharmacy  Per chart review, most recent labs are unremarkable  and the above chronic conditions are well controlled with current regimen or no medication at all.   We discussed if patient is able to tolerate modifying omeprazole  to omeprazole  20 mg daily and the second dose PRN 20 mg daily for as long as she is able to tolerate. If she is not able to tolerate, resume omeprazole  20 mg twice daily.   Given patient's age, discussed to reduce Zyrtec from 10 mg daily to 5 mg daily due to age and patient was agreeable.   Discussed the possibility of holding OTC Omega-3 fatty acids and red yeast rice. Informed the patient that although her lipid panel is currently controlled, it could be influenced by these supplements. If patient is interested in reducing medications, she could possibly start with one or both of these. She is open to making changes, provided her PCP approves. Alternatively, the patient could start a low-intensity statin, such as lovastatin, to help control LDL and triglycerides. Will collaborate with PCP.  Follow Up Plan: 4 weeks  Dorcas Solian, PharmD Clinical Pharmacist Cell: [phone]

## 2023-08-22 NOTE — Telephone Encounter (Signed)
 Copied from CRM [id number]. Topic: Referral - Request for Referral >> Aug 22, 2023 11:32 AM Oddis Bench wrote: Did the patient discuss referral with their provider in the last year? Yes (If No - schedule appointment) (If Yes - send message)  Appointment offered? No  Type of order/referral and detailed reason for visit: keegan [facility] is calling about requesting a wheel chair, shower chair and beside commode   Preference of office, provider, location: n/a  If referral order, have you been seen by this specialty before? No (If Yes, this issue or another issue? When? Where?  Can we respond through MyChart? No

## 2023-08-23 NOTE — Telephone Encounter (Signed)
 Ok to send to DME/verbal order form for requested DME - use mobility, decr ADLs and RA from her problem list. Can go to whatever DME that she wants.  It is possible that it will be declined without F2F for documentation, but let's try

## 2023-08-23 NOTE — Telephone Encounter (Signed)
 Pt advised. F2F visit needed. She verbalized understanding. Appt scheduled for 06/30 at 8:00 am

## 2023-08-25 ENCOUNTER — Other Ambulatory Visit: Payer: Self-pay | Admitting: Family Medicine

## 2023-08-28 ENCOUNTER — Ambulatory Visit (INDEPENDENT_AMBULATORY_CARE_PROVIDER_SITE_OTHER)

## 2023-08-28 ENCOUNTER — Encounter (INDEPENDENT_AMBULATORY_CARE_PROVIDER_SITE_OTHER): Payer: Self-pay | Admitting: Vascular Surgery

## 2023-08-28 ENCOUNTER — Ambulatory Visit (INDEPENDENT_AMBULATORY_CARE_PROVIDER_SITE_OTHER): Admitting: Vascular Surgery

## 2023-08-28 ENCOUNTER — Telehealth: Payer: Self-pay

## 2023-08-28 VITALS — BP 127/87 | HR 58 | Ht 65.0 in | Wt 210.5 lb

## 2023-08-28 DIAGNOSIS — M069 Rheumatoid arthritis, unspecified: Secondary | ICD-10-CM | POA: Diagnosis not present

## 2023-08-28 DIAGNOSIS — M7989 Other specified soft tissue disorders: Secondary | ICD-10-CM

## 2023-08-28 DIAGNOSIS — I10 Essential (primary) hypertension: Secondary | ICD-10-CM

## 2023-08-28 DIAGNOSIS — I83893 Varicose veins of bilateral lower extremities with other complications: Secondary | ICD-10-CM

## 2023-08-28 DIAGNOSIS — I38 Endocarditis, valve unspecified: Secondary | ICD-10-CM

## 2023-08-28 DIAGNOSIS — R7303 Prediabetes: Secondary | ICD-10-CM | POA: Diagnosis not present

## 2023-08-28 DIAGNOSIS — I890 Lymphedema, not elsewhere classified: Secondary | ICD-10-CM | POA: Diagnosis not present

## 2023-08-28 DIAGNOSIS — Z7409 Other reduced mobility: Secondary | ICD-10-CM

## 2023-08-28 NOTE — Assessment & Plan Note (Signed)
 The patient is developed at least stage II lymphedema of the lower extremities with significant swelling despite compression elevation, hyperpigmentation, and marked heaviness in the legs.  She has a litany of medical issues which cause swelling but has developed chronic scarred lymphatic channels with her chronic swelling. Noninvasive studies today show no evidence of deep venous thrombosis, superficial thrombophlebitis, or significant venous reflux in either lower extremity.  A lymphedema pump be an excellent adjuvant therapy to try to improve her symptoms.  Will try to get that obtained for her in the near future.  Follow-up in 4 to 6 months.

## 2023-08-28 NOTE — Progress Notes (Signed)
 MRN : [id number]  Glenda Williams is a 83 y.o. (03-Jan-1941) female who presents with chief complaint of  Chief Complaint  Patient presents with   fu pt conv + Bilat reflux  .  History of Present Illness: Patient returns today in follow up of her leg swelling and discoloration.  She is doing well.  There have been no major changes since her last visit last month.  Leg swelling is stable.  Her activity remains fairly poor.  She has been trying to elevate her legs more.  Noninvasive studies today show no evidence of deep venous thrombosis, superficial thrombophlebitis, or significant venous reflux in either lower extremity.  Current Outpatient Medications  Medication Sig Dispense Refill   albuterol  (VENTOLIN  HFA) 108 (90 Base) MCG/ACT inhaler Inhale 1-2 puffs into the lungs every 4 (four) hours as needed for wheezing or shortness of breath. 26.8 g 3   azelastine  (ASTELIN ) 0.1 % nasal spray Place 2 sprays into both nostrils 2 (two) times daily. Use in each nostril as directed (Patient taking differently: Place 2 sprays into both nostrils 2 (two) times daily as needed. Use in each nostril as directed) 30 mL 12   benzonatate  (TESSALON ) 100 MG capsule Take 1-2 capsules (100-200 mg total) by mouth 3 (three) times daily as needed for cough. 30 capsule 0   cetirizine (ZYRTEC) 10 MG tablet Take 10 mg by mouth daily. (Patient taking differently: Take 10 mg by mouth daily as needed for allergies.)     cholecalciferol  (VITAMIN D ) 400 units TABS tablet Take 400 Units by mouth daily.     conjugated estrogens  (PREMARIN ) vaginal cream Apply 0.5mg  (pea-sized amount)  just inside the vaginal introitus with a finger-tip on  Monday, Wednesday and Friday nights. 30 g 12   EPINEPHrine  0.3 mg/0.3 mL IJ SOAJ injection Inject 0.3 mLs into the skin once as needed for anaphylaxis.  1   fluvoxaMINE  (LUVOX ) 50 MG tablet Take 3 tablets (150 mg total) by mouth at bedtime. 90 tablet 5   folic acid  (FOLVITE ) 1 MG tablet Take 1  mg by mouth daily.     gabapentin  (NEURONTIN ) 100 MG capsule Take 1 capsule (100 mg total) by mouth 2 (two) times daily. 180 capsule 1   golimumab  (SIMPONI  ARIA) 50 MG/4ML SOLN injection Inject into the vein.     guaiFENesin  200 MG tablet Take 1 tablet (200 mg total) by mouth every 4 (four) hours as needed for cough or to loosen phlegm. 30 suppository 0   hydrochlorothiazide  (HYDRODIURIL ) 25 MG tablet Take 25 mg by mouth daily.     hydrocortisone  cream 1 % Apply 1 application  topically as needed for itching.     ketoconazole  (NIZORAL ) 2 % cream Apply 1 application topically daily. 30 g 2   ketoconazole  (NIZORAL ) 2 % shampoo Apply topically.     levothyroxine  (SYNTHROID ) 125 MCG tablet Take 1 tablet (125 mcg total) by mouth daily. 90 tablet 1   methotrexate  (RHEUMATREX) 2.5 MG tablet Take 20 mg by mouth every Thursday.     NYSTATIN  powder APPLY TOPICALLY TO SKIN FOLDS THREE TIMES DAILY 60 g 1   Omega-3 Fatty Acids (FISH OIL) 1000 MG CAPS Take 1 capsule by mouth daily.     omeprazole  (PRILOSEC) 20 MG capsule TAKE 1 CAPSULE BY MOUTH TWICE  DAILY BEFORE MEALS 180 capsule 3   potassium chloride  SA (K-DUR,KLOR-CON ) 20 MEQ tablet Take 1 tablet by mouth  daily 90 tablet 1   predniSONE  (DELTASONE ) 20 MG  tablet 2 tabs poqday 1-3, 1 tabs poqday 4-6 9 tablet 0   propranolol  (INDERAL ) 40 MG tablet 80 mg in the am and 40 mg at night     Red Yeast Rice 600 MG CAPS Take 1 capsule by mouth daily.     rivaroxaban  (XARELTO ) 20 MG TABS tablet Take 1 tablet (20 mg total) by mouth daily with supper. Please report any side effects to R leg following starting including worsening of bruise, new/changed edema, color variation or temperature change. 30 tablet 5   torsemide (DEMADEX) 20 MG tablet Take 20 mg by mouth. Patient states she takes 1 tablet per day as needed     No current facility-administered medications for this visit.    Past Medical History:  Diagnosis Date   A-fib [facility])    Arthritis    Bell palsy  09/04/2014   Congestion of nasal sinus 02/14/2023   Cough with sputum 02/14/2023   GERD (gastroesophageal reflux disease)    Heart disease    Rheumatoid arthritis (HCC)    Rhinosinusitis 02/14/2023   Thyroid  disease    Tremors of nervous system     Past Surgical History:  Procedure Laterality Date   BREAST BIOPSY Left 1988   Benign   BREAST BIOPSY Left 1987   Benign   BREAST EXCISIONAL BIOPSY     BROW LIFT Bilateral 05/23/2016   Procedure: BLEPHAROPLASTY upper eyelid; w/excess skin;  Surgeon: Greig CHRISTELLA Gay, MD;  Location: [facility];  Service: Ophthalmology;  Laterality: Bilateral;   CARPAL TUNNEL RELEASE Bilateral    CHOLECYSTECTOMY     Dr. Dessa   COLONOSCOPY  2006   JOINT REPLACEMENT Bilateral    knees   REPLACEMENT TOTAL KNEE Left 2005   REPLACEMENT TOTAL KNEE Right 1998     Social History   Tobacco Use   Smoking status: Never   Smokeless tobacco: Never  Vaping Use   Vaping status: Never Used  Substance Use Topics   Alcohol use: No   Drug use: No      Family History  Problem Relation Age of Onset   Stroke Mother    Hypertension Mother    Heart disease Father    Arthritis Father    Parkinson's disease Sister    Parkinson's disease Brother    Bladder Cancer Brother    Breast cancer Neg Hx    Kidney cancer Neg Hx      Allergies  Allergen Reactions   Bactrim  [Sulfamethoxazole -Trimethoprim ] Anaphylaxis   Mirabegron  Rash    Patient reported it hospitalized her and caused bradycardia   Macrobid  [Nitrofurantoin  Macrocrystal] Rash   Oxybutynin  Rash   Penicillins Rash    Has patient had a PCN reaction causing immediate rash, facial/tongue/throat swelling, SOB or lightheadedness with hypotension: Yes Has patient had a PCN reaction causing severe rash involving mucus membranes or skin necrosis: No Has patient had a PCN reaction that required hospitalization: No Has patient had a PCN reaction occurring within the last 10 years: Unknown If all of  the above answers are NO, then may proceed with Cephalosporin use.      REVIEW OF SYSTEMS (Negative unless checked)   Constitutional: [] Weight loss  [] Fever  [] Chills Cardiac: [] Chest pain   [] Chest pressure   [] Palpitations   [] Shortness of breath when laying flat   [] Shortness of breath at rest   [] Shortness of breath with exertion. Vascular:  [] Pain in legs with walking   [] Pain in legs at rest   [] Pain in legs  when laying flat   [] Claudication   [] Pain in feet when walking  [] Pain in feet at rest  [] Pain in feet when laying flat   [] History of DVT   [] Phlebitis   [x] Swelling in legs   [x] Varicose veins   [] Non-healing ulcers Pulmonary:   [] Uses home oxygen   [] Productive cough   [] Hemoptysis   [] Wheeze  [] COPD   [] Asthma Neurologic:  [] Dizziness  [] Blackouts   [] Seizures   [] History of stroke   [] History of TIA  [] Aphasia   [] Temporary blindness   [] Dysphagia   [] Weakness or numbness in arms   [] Weakness or numbness in legs Musculoskeletal:  [x] Arthritis   [] Joint swelling   [x] Joint pain   [] Low back pain Hematologic:  [] Easy bruising  [] Easy bleeding   [] Hypercoagulable state   [] Anemic  [] Hepatitis Gastrointestinal:  [] Blood in stool   [] Vomiting blood  [x] Gastroesophageal reflux/heartburn   [] Abdominal pain Genitourinary:  [] Chronic kidney disease   [] Difficult urination  [] Frequent urination  [] Burning with urination   [] Hematuria Skin:  [] Rashes   [] Ulcers   [] Wounds Psychological:  [] History of anxiety   []  History of major depression.  Physical Examination  BP 127/87   Pulse (!) 58   Ht 5' 5 (1.651 m)   Wt 210 lb 8 oz (95.5 kg)   BMI 35.03 kg/m  Gen:  WD/WN, NAD Head: [place]/AT, No temporalis wasting. Ear/Nose/Throat: Hearing grossly intact, nares w/o erythema or drainage Eyes: Conjunctiva clear. Sclera non-icteric Neck: Supple.  Trachea midline Pulmonary:  Good air movement, no use of accessory muscles.  Cardiac: somewhat irregular Vascular:  Vessel Right Left  Radial  Palpable Palpable                          PT Palpable Palpable  DP Palpable Palpable   Gastrointestinal: soft, non-tender/non-distended. No guarding/reflex.  Musculoskeletal: M/S 5/5 throughout.  No deformity or atrophy. 1+ BLE edema. In a wheelchair. Neurologic: Sensation grossly intact in extremities.  Symmetrical.  Speech is fluent.  Psychiatric: Judgment intact, Mood & affect appropriate for pt's clinical situation. Dermatologic: No rashes or ulcers noted.  No cellulitis or open wounds.      Labs Recent Results (from the past 2160 hours)  CBC w/Diff/Platelet     Status: Abnormal   Collection Time: 08/03/23 10:30 AM  Result Value Ref Range   WBC 8.4 3.4 - 10.8 x10E3/uL   RBC 3.82 3.77 - 5.28 x10E6/uL   Hemoglobin 10.2 (L) 11.1 - 15.9 g/dL   Hematocrit 66.5 (L) 65.9 - 46.6 %   MCV 87 79 - 97 fL   MCH 26.7 26.6 - 33.0 pg   MCHC 30.5 (L) 31.5 - 35.7 g/dL   RDW 85.4 88.2 - 84.5 %   Platelets 320 150 - 450 x10E3/uL   Neutrophils 57 Not Estab. %   Lymphs 30 Not Estab. %   Monocytes 9 Not Estab. %   Eos 3 Not Estab. %   Basos 1 Not Estab. %   Neutrophils Absolute 4.8 1.4 - 7.0 x10E3/uL   Lymphocytes Absolute 2.5 0.7 - 3.1 x10E3/uL   Monocytes Absolute 0.7 0.1 - 0.9 x10E3/uL   EOS (ABSOLUTE) 0.3 0.0 - 0.4 x10E3/uL   Basophils Absolute 0.1 0.0 - 0.2 x10E3/uL   Immature Granulocytes 0 Not Estab. %   Immature Grans (Abs) 0.0 0.0 - 0.1 x10E3/uL  Comprehensive metabolic panel with GFR     Status: None   Collection Time: 08/03/23 10:30 AM  Result Value Ref Range   Glucose 90 70 - 99 mg/dL   BUN 16 8 - 27 mg/dL   Creatinine, Ser 9.24 0.57 - 1.00 mg/dL   eGFR 80 >40 fO/fpw/8.26   BUN/Creatinine Ratio 21 12 - 28   Sodium 140 134 - 144 mmol/L   Potassium 3.5 3.5 - 5.2 mmol/L   Chloride 100 96 - 106 mmol/L   CO2 27 20 - 29 mmol/L   Calcium 9.6 8.7 - 10.3 mg/dL   Total Protein 7.2 6.0 - 8.5 g/dL   Albumin 3.8 3.7 - 4.7 g/dL   Globulin, Total 3.4 1.5 - 4.5 g/dL    Bilirubin Total 0.6 0.0 - 1.2 mg/dL   Alkaline Phosphatase 87 44 - 121 IU/L   AST 23 0 - 40 IU/L   ALT 16 0 - 32 IU/L  TSH     Status: None   Collection Time: 08/03/23 10:30 AM  Result Value Ref Range   TSH 3.440 0.450 - 4.500 uIU/mL  Lipid Panel With LDL/HDL Ratio     Status: None   Collection Time: 08/03/23 10:30 AM  Result Value Ref Range   Cholesterol, Total 133 100 - 199 mg/dL   Triglycerides 61 0 - 149 mg/dL   HDL 47 >60 mg/dL   VLDL Cholesterol Cal 13 5 - 40 mg/dL   LDL Chol Calc (NIH) 73 0 - 99 mg/dL   LDL/HDL Ratio 1.6 0.0 - 3.2 ratio    Comment:                                     LDL/HDL Ratio                                             Men  Women                               1/2 Avg.Risk  1.0    1.5                                   Avg.Risk  3.6    3.2                                2X Avg.Risk  6.2    5.0                                3X Avg.Risk  8.0    6.1   HgB A1c     Status: None   Collection Time: 08/03/23 10:30 AM  Result Value Ref Range   Hgb A1c MFr Bld 5.6 4.8 - 5.6 %    Comment:          Prediabetes: 5.7 - 6.4          Diabetes: >6.4          Glycemic control for adults with diabetes: <7.0    Est. average glucose Bld gHb Est-mCnc 114 mg/dL    Radiology No results found.  Assessment/Plan  Swelling of limb Noninvasive studies today show  no evidence of deep venous thrombosis, superficial thrombophlebitis, or significant venous reflux in either lower extremity. The patient is likely developed lymphedema from chronic scarring and lymphatic channels in a patient with litany of medical issues which will cause swelling.  A lymphedema pump would be an excellent adjuvant therapy to try to improve her symptoms.  We will plan to see her back in 4 to 6 months.  Lymphedema The patient is developed at least stage II lymphedema of the lower extremities with significant swelling despite compression elevation, hyperpigmentation, and marked heaviness in the legs.   She has a litany of medical issues which cause swelling but has developed chronic scarred lymphatic channels with her chronic swelling. Noninvasive studies today show no evidence of deep venous thrombosis, superficial thrombophlebitis, or significant venous reflux in either lower extremity.  A lymphedema pump be an excellent adjuvant therapy to try to improve her symptoms.  Will try to get that obtained for her in the near future.  Follow-up in 4 to 6 months.  Poor mobility Poor mobility certainly worsens lower extremity swelling.   Rheumatoid arthritis (HCC) Her arthritis and poor mobility can certainly worsen her lower extremity swelling   HTN (hypertension) blood pressure control important in reducing the progression of atherosclerotic disease. On appropriate oral medications.     Valvular heart disease Cardiac dysfunction particularly MR and TR can create worsening swelling.  Selinda Gu, MD  08/28/2023 3:58 PM    This note was created with Dragon medical transcription system.  Any errors from dictation are purely unintentional

## 2023-08-28 NOTE — Assessment & Plan Note (Signed)
 Noninvasive studies today show no evidence of deep venous thrombosis, superficial thrombophlebitis, or significant venous reflux in either lower extremity. The patient is likely developed lymphedema from chronic scarring and lymphatic channels in a patient with litany of medical issues which will cause swelling.  A lymphedema pump would be an excellent adjuvant therapy to try to improve her symptoms.  We will plan to see her back in 4 to 6 months.

## 2023-08-29 DIAGNOSIS — N3946 Mixed incontinence: Secondary | ICD-10-CM | POA: Diagnosis not present

## 2023-08-29 DIAGNOSIS — L89312 Pressure ulcer of right buttock, stage 2: Secondary | ICD-10-CM | POA: Diagnosis not present

## 2023-08-29 DIAGNOSIS — R052 Subacute cough: Secondary | ICD-10-CM | POA: Diagnosis not present

## 2023-08-29 DIAGNOSIS — J301 Allergic rhinitis due to pollen: Secondary | ICD-10-CM | POA: Diagnosis not present

## 2023-08-29 DIAGNOSIS — M792 Neuralgia and neuritis, unspecified: Secondary | ICD-10-CM | POA: Diagnosis not present

## 2023-08-29 DIAGNOSIS — M0579 Rheumatoid arthritis with rheumatoid factor of multiple sites without organ or systems involvement: Secondary | ICD-10-CM | POA: Diagnosis not present

## 2023-09-03 ENCOUNTER — Encounter: Payer: Self-pay | Admitting: Family Medicine

## 2023-09-03 ENCOUNTER — Ambulatory Visit (INDEPENDENT_AMBULATORY_CARE_PROVIDER_SITE_OTHER): Admitting: Family Medicine

## 2023-09-03 VITALS — BP 128/52 | HR 69 | Wt 200.7 lb

## 2023-09-03 DIAGNOSIS — R296 Repeated falls: Secondary | ICD-10-CM | POA: Diagnosis not present

## 2023-09-03 DIAGNOSIS — Z789 Other specified health status: Secondary | ICD-10-CM | POA: Diagnosis not present

## 2023-09-03 DIAGNOSIS — L89311 Pressure ulcer of right buttock, stage 1: Secondary | ICD-10-CM

## 2023-09-03 DIAGNOSIS — R4689 Other symptoms and signs involving appearance and behavior: Secondary | ICD-10-CM | POA: Diagnosis not present

## 2023-09-03 DIAGNOSIS — Z7409 Other reduced mobility: Secondary | ICD-10-CM

## 2023-09-03 DIAGNOSIS — M069 Rheumatoid arthritis, unspecified: Secondary | ICD-10-CM

## 2023-09-03 DIAGNOSIS — L89321 Pressure ulcer of left buttock, stage 1: Secondary | ICD-10-CM

## 2023-09-03 NOTE — Progress Notes (Addendum)
 Established patient visit   Patient: Glenda Williams   DOB: 09/15/1940   83 y.o. Female  MRN: [id number] Visit Date: 09/03/2023  Today's healthcare provider: Jon Eva, MD   Chief Complaint  Patient presents with   Durable Medical Equipment (DME)    Patient is requesting a wheel chair, shower chair and beside commode/bathroom chair. DME also requested by home health.    Fall    Patient daughter reports she fell yesterday and would like her left shoulder looked at as well as her eye. Patient reports she fell while cleaning. She was sitting on toilet and bent over and fell forward. Patient as well as daughter reports she is okay when she is standing it is when she is bending over or sitting that her balance seems off   Medication Consultation    Patients daughter reports luvox  seems it is making mother grouchy but more alert so had questions in regards to medication   Subjective    HPI HPI     Durable Medical Equipment (DME)    Additional comments: Patient is requesting a wheel chair, shower chair and beside commode/bathroom chair. DME also requested by home health.         Fall    Additional comments: Patient daughter reports she fell yesterday and would like her left shoulder looked at as well as her eye. Patient reports she fell while cleaning. She was sitting on toilet and bent over and fell forward. Patient as well as daughter reports she is okay when she is standing it is when she is bending over or sitting that her balance seems off        Medication Consultation    Additional comments: Patients daughter reports luvox  seems it is making mother grouchy but more alert so had questions in regards to medication      Last edited by Lilian Fitzpatrick, CMA on 09/03/2023  8:17 AM.       Discussed the use of AI scribe software for clinical note transcription with the patient, who gave verbal consent to proceed.  History of Present Illness   Glenda Williams is an 83  year old female who presents for evaluation after a recent fall and discussion about her medication, Luvox .  She experienced a fall yesterday while sitting on the commode and cleaning, resulting in a bruise on her nose and shoulder. She is on Xarelto , which can cause increased bleeding, but only experienced minimal bleeding from the fall. No confusion or changes in vision have occurred since the fall.  She is currently taking Luvox  and feels mentally clearer, although she has become more blunt in her interactions. Her family has noticed this change. She continues to have an obsession with cleaning, risking another fall.  She deals with incontinence issues and has tried various incontinence underwear for comfort. A nurse has helped her find a gel pad for her chair, which seems to be helping with her comfort.         Medications: Outpatient Medications Prior to Visit  Medication Sig   albuterol  (VENTOLIN  HFA) 108 (90 Base) MCG/ACT inhaler Inhale 1-2 puffs into the lungs every 4 (four) hours as needed for wheezing or shortness of breath.   azelastine  (ASTELIN ) 0.1 % nasal spray Place 2 sprays into both nostrils 2 (two) times daily. Use in each nostril as directed (Patient taking differently: Place 2 sprays into both nostrils 2 (two) times daily as needed. Use in each nostril as directed)  benzonatate  (TESSALON ) 100 MG capsule Take 1-2 capsules (100-200 mg total) by mouth 3 (three) times daily as needed for cough.   cetirizine (ZYRTEC) 10 MG tablet Take 10 mg by mouth daily. (Patient taking differently: Take 10 mg by mouth daily as needed for allergies.)   cholecalciferol  (VITAMIN D ) 400 units TABS tablet Take 400 Units by mouth daily.   conjugated estrogens  (PREMARIN ) vaginal cream Apply 0.5mg  (pea-sized amount)  just inside the vaginal introitus with a finger-tip on  Monday, Wednesday and Friday nights.   EPINEPHrine  0.3 mg/0.3 mL IJ SOAJ injection Inject 0.3 mLs into the skin once as needed for  anaphylaxis.   fluvoxaMINE  (LUVOX ) 50 MG tablet Take 3 tablets (150 mg total) by mouth at bedtime.   folic acid  (FOLVITE ) 1 MG tablet Take 1 mg by mouth daily.   gabapentin  (NEURONTIN ) 100 MG capsule Take 1 capsule (100 mg total) by mouth 2 (two) times daily.   golimumab  (SIMPONI  ARIA) 50 MG/4ML SOLN injection Inject into the vein.   guaiFENesin  200 MG tablet Take 1 tablet (200 mg total) by mouth every 4 (four) hours as needed for cough or to loosen phlegm.   hydrochlorothiazide  (HYDRODIURIL ) 25 MG tablet Take 25 mg by mouth daily.   hydrocortisone  cream 1 % Apply 1 application  topically as needed for itching.   ketoconazole  (NIZORAL ) 2 % cream Apply 1 application topically daily.   ketoconazole  (NIZORAL ) 2 % shampoo Apply topically.   levothyroxine  (SYNTHROID ) 125 MCG tablet Take 1 tablet (125 mcg total) by mouth daily.   methotrexate  (RHEUMATREX) 2.5 MG tablet Take 20 mg by mouth every Thursday.   NYSTATIN  powder APPLY TOPICALLY TO SKIN FOLDS THREE TIMES DAILY   Omega-3 Fatty Acids (FISH OIL) 1000 MG CAPS Take 1 capsule by mouth daily.   omeprazole  (PRILOSEC) 20 MG capsule TAKE 1 CAPSULE BY MOUTH TWICE  DAILY BEFORE MEALS   potassium chloride  SA (K-DUR,KLOR-CON ) 20 MEQ tablet Take 1 tablet by mouth  daily   predniSONE  (DELTASONE ) 20 MG tablet 2 tabs poqday 1-3, 1 tabs poqday 4-6   propranolol  (INDERAL ) 40 MG tablet 80 mg in the am and 40 mg at night   Red Yeast Rice 600 MG CAPS Take 1 capsule by mouth daily.   rivaroxaban  (XARELTO ) 20 MG TABS tablet Take 1 tablet (20 mg total) by mouth daily with supper. Please report any side effects to R leg following starting including worsening of bruise, new/changed edema, color variation or temperature change.   torsemide (DEMADEX) 20 MG tablet Take 20 mg by mouth. Patient states she takes 1 tablet per day as needed   No facility-administered medications prior to visit.    Review of Systems     Objective    BP (!) 128/52 (BP Location: Left Arm,  Patient Position: Sitting, Cuff Size: Large)   Pulse 69   Wt 200 lb 11.2 oz (91 kg)   SpO2 99%   BMI 33.40 kg/m    Physical Exam Vitals reviewed.  Constitutional:      General: She is not in acute distress.    Appearance: She is well-developed. She is not diaphoretic.     Comments: Sitting in wheelchair Small laceration on L side of nose, bruising on L forehead. No bony tenderness  HENT:     Head: Normocephalic and atraumatic.   Eyes:     General: No scleral icterus.    Conjunctiva/sclera: Conjunctivae normal.   Neck:     Thyroid : No thyromegaly.   Cardiovascular:  Rate and Rhythm: Normal rate and regular rhythm.     Heart sounds: Normal heart sounds.  Pulmonary:     Effort: Pulmonary effort is normal. No respiratory distress.     Breath sounds: Normal breath sounds. No wheezing, rhonchi or rales.   Musculoskeletal:     Cervical back: Neck supple.  Lymphadenopathy:     Cervical: No cervical adenopathy.   Skin:    General: Skin is warm and dry.   Neurological:     Mental Status: She is alert and oriented to person, place, and time. Mental status is at baseline.   Psychiatric:        Mood and Affect: Mood normal.        Behavior: Behavior normal.      No results found for any visits on 09/03/23.  Assessment & Plan     Problem List Items Addressed This Visit       Musculoskeletal and Integument   Rheumatoid arthritis (HCC)     Other   Poor mobility   Compulsive behavior   Falls - Primary   Pressure injury of right buttock, stage 1   Pressure injury of left buttock, stage 1   Decreased activities of daily living (ADL)        Recent fall with facial contusions Recent fall while sitting on the commode resulted in a nasal contusion and forehead bruise. No confusion or vision changes post-fall. On Xarelto , increasing bleeding risk, but only minimal bleeding occurred. No bony tenderness on nasal examination, indicating no fracture. - Ensure safety  measures to prevent future falls.  Mobility impairment requiring durable medical equipment Mobility impairment necessitates durable medical equipment. Equipment needs include a transport chair, shower chair with arms, and a bedside commode. Medicare and secondary insurance coverage discussed, with a plan to use a local DME provider or home health company for equipment procurement. - Order transport chair, shower chair with arms, and bedside commode. - Coordinate with home health company or [facility] in [place] for DME procurement.  Pressure ulcer (bedsore) Pressure ulcer management is ongoing with weekly nurse visits. A gel pad for the chair has been introduced and appears beneficial. - Continue weekly nurse visits for pressure ulcer management. - Continue using gel pad for chair.  Urinary incontinence Ongoing urinary incontinence. Previous appointment with a specialist was canceled, but efforts are being made to reschedule. Various incontinence underwear options have been trialed, and a comfortable option has been found. - Reschedule appointment with specialist for further evaluation. - Continue using comfortable incontinence underwear.  Obsessive-compulsive disorder with compulsive cleaning behavior Continued compulsive cleaning behavior despite previous discussions. Luvox  appears effective overall, with improved clarity of thought, though some increased bluntness in communication noted. No indication that bluntness is a side effect of Luvox . - Continue current Luvox  regimen. - Monitor for any changes in behavior or side effects.       Addendum: Patient has a mobility limitation that impairs ability to do ADLs, including bathing, toileting, dressing. She is also a fall risk.  She cannot use a cane/crutches/walker to resolve this issue. She has a caregiver that provides assistance. She does not have arm strength to self propel wheelchair. Can walk 3 feet unassisted and with  assistance.  Return in about 3 months (around 12/04/2023) for chronic disease f/u.       Jon Eva, MD  [facility] [phone] (phone) [phone] (fax)  [facility]

## 2023-09-05 DIAGNOSIS — R052 Subacute cough: Secondary | ICD-10-CM | POA: Diagnosis not present

## 2023-09-05 DIAGNOSIS — L89312 Pressure ulcer of right buttock, stage 2: Secondary | ICD-10-CM | POA: Diagnosis not present

## 2023-09-05 DIAGNOSIS — N3946 Mixed incontinence: Secondary | ICD-10-CM | POA: Diagnosis not present

## 2023-09-05 DIAGNOSIS — J301 Allergic rhinitis due to pollen: Secondary | ICD-10-CM | POA: Diagnosis not present

## 2023-09-05 DIAGNOSIS — M792 Neuralgia and neuritis, unspecified: Secondary | ICD-10-CM | POA: Diagnosis not present

## 2023-09-05 DIAGNOSIS — M0579 Rheumatoid arthritis with rheumatoid factor of multiple sites without organ or systems involvement: Secondary | ICD-10-CM | POA: Diagnosis not present

## 2023-09-10 DIAGNOSIS — M0579 Rheumatoid arthritis with rheumatoid factor of multiple sites without organ or systems involvement: Secondary | ICD-10-CM | POA: Diagnosis not present

## 2023-09-11 DIAGNOSIS — L89312 Pressure ulcer of right buttock, stage 2: Secondary | ICD-10-CM | POA: Diagnosis not present

## 2023-09-11 DIAGNOSIS — N3946 Mixed incontinence: Secondary | ICD-10-CM | POA: Diagnosis not present

## 2023-09-11 DIAGNOSIS — R052 Subacute cough: Secondary | ICD-10-CM | POA: Diagnosis not present

## 2023-09-11 DIAGNOSIS — J301 Allergic rhinitis due to pollen: Secondary | ICD-10-CM | POA: Diagnosis not present

## 2023-09-11 DIAGNOSIS — M0579 Rheumatoid arthritis with rheumatoid factor of multiple sites without organ or systems involvement: Secondary | ICD-10-CM | POA: Diagnosis not present

## 2023-09-11 DIAGNOSIS — M792 Neuralgia and neuritis, unspecified: Secondary | ICD-10-CM | POA: Diagnosis not present

## 2023-09-14 DIAGNOSIS — M0579 Rheumatoid arthritis with rheumatoid factor of multiple sites without organ or systems involvement: Secondary | ICD-10-CM | POA: Diagnosis not present

## 2023-09-14 DIAGNOSIS — M792 Neuralgia and neuritis, unspecified: Secondary | ICD-10-CM | POA: Diagnosis not present

## 2023-09-14 DIAGNOSIS — J301 Allergic rhinitis due to pollen: Secondary | ICD-10-CM | POA: Diagnosis not present

## 2023-09-14 DIAGNOSIS — R052 Subacute cough: Secondary | ICD-10-CM | POA: Diagnosis not present

## 2023-09-14 DIAGNOSIS — L89312 Pressure ulcer of right buttock, stage 2: Secondary | ICD-10-CM | POA: Diagnosis not present

## 2023-09-14 DIAGNOSIS — N3946 Mixed incontinence: Secondary | ICD-10-CM | POA: Diagnosis not present

## 2023-09-17 ENCOUNTER — Telehealth: Payer: Self-pay

## 2023-09-17 NOTE — Telephone Encounter (Signed)
 Copied from CRM [id number]. Topic: Clinical - Home Health Verbal Orders >> Sep 14, 2023 12:13 PM Winona R wrote: Caller/Agency: Glade Lenis  from [facility] home health  Callback [phone]- secure may leave voicemail  Service Requested: Physical Therapy re certification  Frequency: 1x 9 Any new concerns about the patient? No   ----------------------------------------------------------------------- From previous Reason for Contact - Medical Advice: Reason for CRM:

## 2023-09-18 DIAGNOSIS — E559 Vitamin D deficiency, unspecified: Secondary | ICD-10-CM | POA: Diagnosis not present

## 2023-09-18 DIAGNOSIS — I5032 Chronic diastolic (congestive) heart failure: Secondary | ICD-10-CM | POA: Diagnosis not present

## 2023-09-18 DIAGNOSIS — F429 Obsessive-compulsive disorder, unspecified: Secondary | ICD-10-CM | POA: Diagnosis not present

## 2023-09-18 DIAGNOSIS — N3946 Mixed incontinence: Secondary | ICD-10-CM | POA: Diagnosis not present

## 2023-09-18 DIAGNOSIS — G250 Essential tremor: Secondary | ICD-10-CM | POA: Diagnosis not present

## 2023-09-18 DIAGNOSIS — M792 Neuralgia and neuritis, unspecified: Secondary | ICD-10-CM | POA: Diagnosis not present

## 2023-09-18 DIAGNOSIS — Z6834 Body mass index (BMI) 34.0-34.9, adult: Secondary | ICD-10-CM | POA: Diagnosis not present

## 2023-09-18 DIAGNOSIS — M0579 Rheumatoid arthritis with rheumatoid factor of multiple sites without organ or systems involvement: Secondary | ICD-10-CM | POA: Diagnosis not present

## 2023-09-18 DIAGNOSIS — I110 Hypertensive heart disease with heart failure: Secondary | ICD-10-CM | POA: Diagnosis not present

## 2023-09-18 DIAGNOSIS — M1990 Unspecified osteoarthritis, unspecified site: Secondary | ICD-10-CM | POA: Diagnosis not present

## 2023-09-18 DIAGNOSIS — E669 Obesity, unspecified: Secondary | ICD-10-CM | POA: Diagnosis not present

## 2023-09-18 DIAGNOSIS — F418 Other specified anxiety disorders: Secondary | ICD-10-CM | POA: Diagnosis not present

## 2023-09-18 DIAGNOSIS — G629 Polyneuropathy, unspecified: Secondary | ICD-10-CM | POA: Diagnosis not present

## 2023-09-18 DIAGNOSIS — Z7409 Other reduced mobility: Secondary | ICD-10-CM | POA: Diagnosis not present

## 2023-09-18 DIAGNOSIS — Z9181 History of falling: Secondary | ICD-10-CM | POA: Diagnosis not present

## 2023-09-18 DIAGNOSIS — K219 Gastro-esophageal reflux disease without esophagitis: Secondary | ICD-10-CM | POA: Diagnosis not present

## 2023-09-18 DIAGNOSIS — Z7901 Long term (current) use of anticoagulants: Secondary | ICD-10-CM | POA: Diagnosis not present

## 2023-09-18 DIAGNOSIS — I4891 Unspecified atrial fibrillation: Secondary | ICD-10-CM | POA: Diagnosis not present

## 2023-09-18 NOTE — Telephone Encounter (Signed)
 OK for verbals

## 2023-09-18 NOTE — Telephone Encounter (Signed)
 Have called Stacy to give verbal ok

## 2023-09-20 ENCOUNTER — Other Ambulatory Visit: Payer: Self-pay

## 2023-09-20 DIAGNOSIS — I5032 Chronic diastolic (congestive) heart failure: Secondary | ICD-10-CM | POA: Diagnosis not present

## 2023-09-20 DIAGNOSIS — G629 Polyneuropathy, unspecified: Secondary | ICD-10-CM | POA: Diagnosis not present

## 2023-09-20 DIAGNOSIS — I110 Hypertensive heart disease with heart failure: Secondary | ICD-10-CM | POA: Diagnosis not present

## 2023-09-20 DIAGNOSIS — M0579 Rheumatoid arthritis with rheumatoid factor of multiple sites without organ or systems involvement: Secondary | ICD-10-CM | POA: Diagnosis not present

## 2023-09-20 DIAGNOSIS — M792 Neuralgia and neuritis, unspecified: Secondary | ICD-10-CM | POA: Diagnosis not present

## 2023-09-20 DIAGNOSIS — N3946 Mixed incontinence: Secondary | ICD-10-CM | POA: Diagnosis not present

## 2023-09-26 ENCOUNTER — Other Ambulatory Visit: Payer: Self-pay

## 2023-09-26 NOTE — Progress Notes (Signed)
   09/26/2023  Patient ID: Glenda Williams, female   DOB: December 28, 1940, 83 y.o.   MRN: [id number]  I called and spoke with patient who stated it was a good time to talk but quickly asked that I call her back in 15 minutes to print the list of her medications.   Informed patient that I have a 0930 appointment. Will call patient back later this morning or afternoon.  Dorcas Solian, PharmD Clinical Pharmacist Cell: [phone]

## 2023-09-27 ENCOUNTER — Telehealth: Payer: Self-pay

## 2023-09-27 DIAGNOSIS — I110 Hypertensive heart disease with heart failure: Secondary | ICD-10-CM | POA: Diagnosis not present

## 2023-09-27 DIAGNOSIS — I4891 Unspecified atrial fibrillation: Secondary | ICD-10-CM

## 2023-09-27 DIAGNOSIS — M792 Neuralgia and neuritis, unspecified: Secondary | ICD-10-CM | POA: Diagnosis not present

## 2023-09-27 DIAGNOSIS — G629 Polyneuropathy, unspecified: Secondary | ICD-10-CM | POA: Diagnosis not present

## 2023-09-27 DIAGNOSIS — E669 Obesity, unspecified: Secondary | ICD-10-CM

## 2023-09-27 DIAGNOSIS — M0579 Rheumatoid arthritis with rheumatoid factor of multiple sites without organ or systems involvement: Secondary | ICD-10-CM | POA: Diagnosis not present

## 2023-09-27 DIAGNOSIS — I5032 Chronic diastolic (congestive) heart failure: Secondary | ICD-10-CM

## 2023-09-27 DIAGNOSIS — K219 Gastro-esophageal reflux disease without esophagitis: Secondary | ICD-10-CM | POA: Diagnosis not present

## 2023-09-27 DIAGNOSIS — Z7901 Long term (current) use of anticoagulants: Secondary | ICD-10-CM

## 2023-09-27 DIAGNOSIS — F418 Other specified anxiety disorders: Secondary | ICD-10-CM

## 2023-09-27 DIAGNOSIS — Z9181 History of falling: Secondary | ICD-10-CM

## 2023-09-27 DIAGNOSIS — E559 Vitamin D deficiency, unspecified: Secondary | ICD-10-CM

## 2023-09-27 DIAGNOSIS — M1990 Unspecified osteoarthritis, unspecified site: Secondary | ICD-10-CM

## 2023-09-27 DIAGNOSIS — Z6834 Body mass index (BMI) 34.0-34.9, adult: Secondary | ICD-10-CM

## 2023-09-27 DIAGNOSIS — N3946 Mixed incontinence: Secondary | ICD-10-CM | POA: Diagnosis not present

## 2023-09-27 DIAGNOSIS — G250 Essential tremor: Secondary | ICD-10-CM

## 2023-09-27 DIAGNOSIS — F429 Obsessive-compulsive disorder, unspecified: Secondary | ICD-10-CM

## 2023-09-27 DIAGNOSIS — Z7409 Other reduced mobility: Secondary | ICD-10-CM

## 2023-09-27 NOTE — Telephone Encounter (Signed)
 Copied from CRM #[id number]. Topic: Clinical - Home Health Verbal Orders >> Sep 27, 2023  3:44 PM Ivette P wrote: Caller/Agency: [facility] Physical Therapy Callback Number: [phone] - Secured Line Service Requested: Physical Therapy Any new concerns about the patient? Yes  Pt had a fall 09/26/2023, pt lost her balance in the closet attempting to grab some clothes. Juan determined pt was ok and did not harm herself.

## 2023-09-28 NOTE — Telephone Encounter (Signed)
 Noted

## 2023-10-01 DIAGNOSIS — G629 Polyneuropathy, unspecified: Secondary | ICD-10-CM | POA: Diagnosis not present

## 2023-10-01 DIAGNOSIS — M792 Neuralgia and neuritis, unspecified: Secondary | ICD-10-CM | POA: Diagnosis not present

## 2023-10-01 DIAGNOSIS — N3946 Mixed incontinence: Secondary | ICD-10-CM | POA: Diagnosis not present

## 2023-10-01 DIAGNOSIS — M0579 Rheumatoid arthritis with rheumatoid factor of multiple sites without organ or systems involvement: Secondary | ICD-10-CM | POA: Diagnosis not present

## 2023-10-01 DIAGNOSIS — I110 Hypertensive heart disease with heart failure: Secondary | ICD-10-CM | POA: Diagnosis not present

## 2023-10-01 DIAGNOSIS — I5032 Chronic diastolic (congestive) heart failure: Secondary | ICD-10-CM | POA: Diagnosis not present

## 2023-10-04 NOTE — Progress Notes (Signed)
 Mrs. Garret had a fall on 7/23. Just wanted to make you aware. Scheduled for a follow up 8/4  Harlene Satterfield  [facility], [place] [facility]  Direct Dial: [phone]  Fax [phone]

## 2023-10-08 ENCOUNTER — Other Ambulatory Visit: Payer: Self-pay

## 2023-10-08 NOTE — Patient Instructions (Signed)
 Visit Information  Thank you for taking time to visit with me today. Please don't hesitate to contact me if I can be of assistance to you before our next scheduled appointment.  Your next care management appointment is by telephone on 11/02/2023 at 11:30 am  Telephone follow-up in 1 month  Please call the care guide team at [phone] if you need to cancel, schedule, or reschedule an appointment.   Please call the Suicide and Crisis Lifeline: 988 call the [place]  National Suicide Prevention Lifeline: 6178687140 or TTY: 417-351-7828 TTY (574)192-0995) to talk to a trained counselor call 1-800-273-TALK (toll free, 24 hour hotline) go to [facility] [address] [place] [phone]) call 911 if you are experiencing a Mental Health or Behavioral Health Crisis or need someone to talk to. Nestora Duos, MSN, RN [place]  [facility], [facility] RN Care Manager Direct Dial: [phone] Fax: [phone]  Heart Failure Action Plan A heart failure action plan helps you know what to do when you have symptoms of heart failure. Your action plan is a color-coded plan that lists the symptoms to watch for and indicates what actions to take. If you have symptoms in the green zone, you're doing well. If you have symptoms in the yellow zone, you're having problems. If you have symptoms in the red zone, you need medical care right away. Follow the plan that was created by you and your health care provider. Review your plan each time you visit your provider. Green zone These signs mean you're doing well and can continue what you're doing: You don't have new or worsening shortness of breath. You have very little swelling or no new swelling. Your weight is stable (no gain or loss). You have a normal activity level. You don't have chest pain or any other new symptoms. Yellow zone These signs and symptoms mean your condition may be  getting worse and you should make some changes: You have trouble breathing when you're active. You have swelling in your feet or legs or have discomfort in your belly. You gain 2-3 lb (0.9-1.4 kg) in 24 hours, or 5 lb (2.3 kg) in a week. This amount may be more or less depending on your condition. You get tired easily. You have trouble sleeping. You have a dry cough. If you have any of these symptoms: Contact your provider within the next day. Your provider may adjust your medicines. Red zone These signs and symptoms mean you should get medical help right away: You have trouble breathing when resting or cannot lie flat and you need to raise your head to help you breathe. You have a dry cough that's getting worse. You have swelling or pain in your feet or legs or discomfort in your belly that's getting worse. You suddenly gain more than 2-3 lb (0.9-1.4 kg) in 24 hours, or more than 5 lb (2.3 kg) in a week. This amount may be more or less depending on your condition. You have trouble staying awake or you feel confused. You don't have an appetite. You have worsening sadness or depression. These symptoms may be an emergency. Call 911 right away. Do not wait to see if the symptoms will go away. Do not drive yourself to the hospital. Follow these instructions at home: Take medicines only as told. Eat a heart-healthy diet. Work with a dietitian to create an eating plan that's best for you. Weigh yourself each day. Your target weight is __________ lb (__________ kg). Call  your provider if you gain more than __________ lb (__________ kg) in 24 hours, or more than __________ lb (__________ kg) in a week. Health care provider name: _____________________________________________________ Health care provider phone number: _____________________________________________________ Where to find more information American Heart Association: heart.org This information is not intended to replace advice given  to you by your health care provider. Make sure you discuss any questions you have with your health care provider. Document Revised: 10/05/2022 Document Reviewed: 10/05/2022 Elsevier Patient Education  2024 [employer].  Energy Conservation Techniques  Sit for as many activities as possible. Use slow, smooth movements.  Rushing increases discomfort. Determine the necessity of performing the task.  Simplify those tasks that are necessary.  (Get clothes out of the dryer when they are warm instead of ironing, let dishes air dry, etc.) Take frequent rests both during and between activities.  Avoid repetitive tasks. Pre-plan your activities; try a daily and/or weekly schedule.  Spread out the activities that are most fatiguing (break up cleaning tasks over multiple days). Remember to plan a balance of work, rest and recreation. Consider the best time for each activity.  Do the most exertive task when you have the most energy. Don't carry items if you can push them.  Slide, don't lift. Push, don't pull. Utilize two hands when appropriate. Maintain good posture and use proper body mechanics. Avoid remaining in one position for too long. When lifting, bend at the knees, not at the waist.  Exhale when bending down, inhale when straightening up. Carry objects as close to your body and as near to the center of the pelvis.  11. Avoid wasted body movements (position yourself for the task so that you avoid bending, twisting, etc.                when possible). 12. Select the best working environment.  Consider lighting, ventilation, clothing, and equipment. 13. Organize your storage areas, making the items you use daily convenient.  Store heaviest items at waist            height.  Store frequently used items between shoulders and knee height.  Consider leaving frequently used       items on countertops.  (You can organize in storage baskets based on time used/purpose). 14. Feelings and emotions can be  real causes of fatigue.  Try to avoid unnecessary worry, irritation, or                    frustration.  Avoid stress, it can also be a source of fatigue. 15. Get help from other people for difficult tasks. 16. Explore equipment or items that may be able to do the job for you with greater ease.  (Electric can        openers, blenders, lightweight items for cleaning, etc.)   Fall Prevention in the Home, Adult Falls can cause injuries and affect people of all ages. There are many simple things that you can do to make your home safe and to help prevent falls. If you need it, ask for help making these changes. What actions can I take to prevent falls? General information Use good lighting in all rooms. Make sure to: Replace any light bulbs that burn out. Turn on lights if it is dark and use night-lights. Keep items that you use often in easy-to-reach places. Lower the shelves around your home if needed. Move furniture so that there are clear paths around it. Do not keep throw rugs or  other things on the floor that can make you trip. If any of your floors are uneven, fix them. Add color or contrast paint or tape to clearly mark and help you see: Grab bars or handrails. First and last steps of staircases. Where the edge of each step is. If you use a ladder or stepladder: Make sure that it is fully opened. Do not climb a closed ladder. Make sure the sides of the ladder are locked in place. Have someone hold the ladder while you use it. Know where your pets are as you move through your home. What can I do in the bathroom?     Keep the floor dry. Clean up any water that is on the floor right away. Remove soap buildup in the bathtub or shower. Buildup makes bathtubs and showers slippery. Use non-skid mats or decals on the floor of the bathtub or shower. Attach bath mats securely with double-sided, non-slip rug tape. If you need to sit down while you are in the shower, use a non-slip  stool. Install grab bars by the toilet and in the bathtub and shower. Do not use towel bars as grab bars. What can I do in the bedroom? Make sure that you have a light by your bed that is easy to reach. Do not use any sheets or blankets on your bed that hang to the floor. Have a firm bench or chair with side arms that you can use for support when you get dressed. What can I do in the kitchen? Clean up any spills right away. If you need to reach something above you, use a sturdy step stool that has a grab bar. Keep electrical cables out of the way. Do not use floor polish or wax that makes floors slippery. What can I do with my stairs? Do not leave anything on the stairs. Make sure that you have a light switch at the top and the bottom of the stairs. Have them installed if you do not have them. Make sure that there are handrails on both sides of the stairs. Fix handrails that are broken or loose. Make sure that handrails are as long as the staircases. Install non-slip stair treads on all stairs in your home if they do not have carpet. Avoid having throw rugs at the top or bottom of stairs, or secure the rugs with carpet tape to prevent them from moving. Choose a carpet design that does not hide the edge of steps on the stairs. Make sure that carpet is firmly attached to the stairs. Fix any carpet that is loose or worn. What can I do on the outside of my home? Use bright outdoor lighting. Repair the edges of walkways and driveways and fix any cracks. Clear paths of anything that can make you trip, such as tools or rocks. Add color or contrast paint or tape to clearly mark and help you see high doorway thresholds. Trim any bushes or trees on the main path into your home. Check that handrails are securely fastened and in good repair. Both sides of all steps should have handrails. Install guardrails along the edges of any raised decks or porches. Have leaves, snow, and ice cleared regularly. Use  sand, salt, or ice melt on walkways during winter months if you live where there is ice and snow. In the garage, clean up any spills right away, including grease or oil spills. What other actions can I take? Review your medicines with your health care provider. Some  medicines can make you confused or feel dizzy. This can increase your chance of falling. Wear closed-toe shoes that fit well and support your feet. Wear shoes that have rubber soles and low heels. Use a cane, walker, scooter, or crutches that help you move around if needed. Talk with your provider about other ways that you can decrease your risk of falls. This may include seeing a physical therapist to learn to do exercises to improve movement and strength. Where to find more information Centers for Disease Control and Prevention, [employer]: TonerPromos.no [employer] on Aging: BaseRingTones.pl National Institute on Aging: BaseRingTones.pl Contact a health care provider if: You are afraid of falling at home. You feel weak, drowsy, or dizzy at home. You fall at home. Get help right away if you: Lose consciousness or have trouble moving after a fall. Have a fall that causes a head injury. These symptoms may be an emergency. Get help right away. Call 911. Do not wait to see if the symptoms will go away. Do not drive yourself to the hospital. This information is not intended to replace advice given to you by your health care provider. Make sure you discuss any questions you have with your health care provider. Document Revised: 10/24/2021 Document Reviewed: 10/24/2021 Elsevier Patient Education  2024 Else[employer] Inc.  Heart Failure: Eating Plan Heart failure is a long-term condition where the heart can't pump enough blood through the body. When this happens, parts of the body don't get the blood and oxygen they need. Living with heart failure can be hard. But a healthy lifestyle and choosing the right foods may help to improve your symptoms. If  you have heart failure, your eating plan will include limiting the amount of salt, also called sodium, and unhealthy fats you eat. What are tips for following this plan? Reading food labels Check food labels for the amount of sodium per serving. Choose foods that have less than 140 mg (milligrams) of sodium in each serving. Check food labels for the number of calories per serving. This is important if you need to limit your daily calorie intake to lose weight. Check food labels for the serving size. If you eat more than one serving, you'll be eating more sodium and calories than what's listed on the label. Look for foods with the words sodium-free, very low sodium, or low sodium on the package. Foods labeled as reduced sodium, lightly salted, or no salt added may still have more sodium than what's recommended for you. Cooking Avoid adding salt when cooking. Before using any salt substitutes, talk with your health care provider or an expert in healthy eating called a dietitian. Season food with salt-free seasonings, spices, or herbs. Check the label of seasoning mixes to make sure they don't contain salt. Cook with heart-healthy oils, such as olive, canola, soybean, or sunflower oil. Do not fry foods. Cook foods using low-fat methods, like baking, boiling, grilling, and broiling. Limit unhealthy fats when cooking. To do this: Remove the skin from poultry, such as chicken. Remove all the fat you can see on meats. Skim the fat off from stews, soups, and gravies before serving them. Meal planning  Limit your intake of: Processed, canned, or prepackaged foods. Foods that are high in trans fat, such as fried foods. Sweets, desserts, sugary drinks, and other foods with added sugar. Full-fat dairy products, such as whole milk. Eat a balanced diet. This may include: 4-5 servings of fruit each day and 4-5 servings of vegetables each day. At  each meal, try to fill one-half of your plate  with fruits and vegetables. Up to 6-8 servings of whole grains each day. Up to 2 servings of lean meat, poultry, or fish each day. One serving of meat is equal to 3 oz (85 g). This is about the same size as a deck of cards. 2 servings of low-fat dairy each day. Heart-healthy fats. Healthy fats called omega-3 fatty acids are a good choice. They're found in foods such as flaxseed and cold-water fish like sardines, salmon, and mackerel. Aim to eat 25-35 g (grams) of fiber a day. Foods that are high in fiber include apples, broccoli, carrots, beans, peas, and whole grains. Do not add salt or condiments that contain salt (such as soy sauce) to foods before eating. When eating at a restaurant, ask that your food be prepared with less salt or no salt, if possible. Try to eat 2 or more plant-based or meat-free meals each week. Cook more meals at home and eat less at restaurants, buffets, and fast food places. General information Do not eat more than 2,300 mg of sodium a day. The amount of sodium that's recommended for you may be lower, depending on your condition. Stay at a healthy weight as told. Ask your provider what a healthy weight is for you. Check your weight every day. Work with your provider and dietitian to make a plan that will help you lose weight or stay at your current weight. Limit how much fluid you drink. Ask your provider or dietitian how much fluid you can have each day. Limit or avoid alcohol as told. Recommended foods Fruits All fresh, frozen, and canned fruits. Dried fruits, such as raisins, prunes, and cranberries. Vegetables All fresh vegetables. Vegetables that are frozen without sauce or added salt. Low-sodium or sodium-free canned vegetables. Grains Bread with less than 80 mg of sodium per slice. Whole-wheat pasta, quinoa, and brown rice. Oats and oatmeal. Barley. Millet. Grits and cream of wheat. Whole-grain and whole-wheat cold cereal. Meats and other protein foods Lean  cuts of meat. Skinless chicken and [place]. Fish with high omega-3 fatty acids, such as salmon, sardines, and other cold-water fishes. Eggs. Dried beans, peas, and edamame. Unsalted nuts and nut butters. Dairy Low-fat or nonfat (skim) milk and dried milk. Rice milk, soy milk, and almond milk. Low-fat or nonfat yogurt. Small amounts of reduced-sodium block cheese. Low-sodium cottage cheese. Fats and oils Olive, canola, soybean, flaxseed, avocado, or sunflower oil. Sweets and desserts Applesauce. Granola bars. Sugar-free pudding and gelatin. Frozen fruit bars. Seasoning and other foods Fresh and dried herbs. Lemon or lime juice. Vinegar. Low-sodium ketchup. Salt-free marinades, salad dressings, sauces, and seasonings. The items listed above may not be all the foods and drinks you can have. Talk with a dietitian to learn more. Foods to avoid Fruits Fruits that are dried with preservatives that contain sodium. Vegetables Canned vegetables. Frozen vegetables with sauce or seasonings. Creamed vegetables. [place] fries. Onion rings. Pickled vegetables and sauerkraut. Grains Bread with more than 80 mg of sodium per slice. Hot or cold cereal with more than 140 mg sodium per serving. Salted pretzels and crackers. Prepackaged breadcrumbs. Bagels, croissants, and biscuits. Meats and other protein foods Ribs and chicken wings. Bacon, ham, pepperoni, bologna, salami, and packaged luncheon meats. Hot dogs, bratwurst, and sausage. Canned meat. Smoked meat and fish. Salted nuts and seeds. Dairy Whole milk, half-and-half, and cream. Buttermilk. Processed cheese, cheese spreads, and cheese curds. Regular cottage cheese. Feta cheese. Shredded cheese. String cheese.  Fats and oils Butter, lard, shortening, ghee, and bacon fat. Canned and packaged gravies. Seasoning and other foods Onion salt, garlic salt, table salt, and sea salt. Marinades. Regular salad dressings. Relishes, pickles, and olives. Meat flavorings and  tenderizers, and bouillon cubes. Horseradish, ketchup, and mustard. Worcestershire sauce. Teriyaki sauce, soy sauce (including reduced sodium). Hot sauce and Tabasco sauce. Steak sauce, fish sauce, oyster sauce, and cocktail sauce. Taco seasonings. Barbecue sauce. Tartar sauce. The items listed above may not be all the foods and drinks you should avoid. Talk with a dietitian to learn more. This information is not intended to replace advice given to you by your health care provider. Make sure you discuss any questions you have with your health care provider. Document Revised: 10/16/2022 Document Reviewed: 10/05/2022 Elsevier Patient Education  2024 [employer].

## 2023-10-08 NOTE — Patient Outreach (Signed)
 Complex Care Management   Visit Note  10/08/2023  Name:  Glenda Williams MRN: [id number] DOB: Aug 12, 1940  Situation: Referral received for Complex Care Management related to Heart Failure and Falls and CHF I obtained verbal consent from Patient.  Visit completed with Patient  on the phone  Background:   Past Medical History:  Diagnosis Date   A-fib [facility])    Arthritis    Bell palsy 09/04/2014   Congestion of nasal sinus 02/14/2023   Cough with sputum 02/14/2023   GERD (gastroesophageal reflux disease)    Heart disease    Rheumatoid arthritis (HCC)    Rhinosinusitis 02/14/2023   Thyroid  disease    Tremors of nervous system     Assessment: Patient Reported Symptoms:  Cognitive Cognitive Status: Alert and oriented to person, place, and time, Insightful and able to interpret abstract concepts, Normal speech and language skills Cognitive/Intellectual Conditions Management [RPT]: None reported or documented in medical history or problem list   Health Maintenance Behaviors: Annual physical exam, Exercise Healing Pattern: Average Health Facilitated by: Rest  Neurological Neurological Review of Symptoms: No symptoms reported Neurological Management Strategies: Medication therapy, Routine screening  HEENT HEENT Symptoms Reported: No symptoms reported HEENT Comment: cataracts - making appointment for surgery    Cardiovascular Cardiovascular Symptoms Reported: No symptoms reported Patient's Recent BP reading at home: occasional swelling in lower legs, has order for torsemide prn, discussed CHF Action Plan and sent Cardiovascular Management Strategies: Routine screening, Medication therapy Cardiovascular Self-Management Outcome: 4 (good)  Respiratory Respiratory Symptoms Reported: No symptoms reported Respiratory Management Strategies: Routine screening, Medication therapy  Endocrine Endocrine Symptoms Reported: No symptoms reported Is patient diabetic?: No    Gastrointestinal  Gastrointestinal Symptoms Reported: No symptoms reported Additional Gastrointestinal Details: occasional constipation, managed, Omeprazole  helps with GERD      Genitourinary Genitourinary Symptoms Reported: Incontinence, Urgency Additional Genitourinary Details: urgency with standing, not new Genitourinary Management Strategies: Incontinence garment/pad  Integumentary Integumentary Symptoms Reported: No symptoms reported Other Integumentary Symptoms: no open areas, reports thin sensitive skin Skin Management Strategies: Routine screening  Musculoskeletal Musculoskelatal Symptoms Reviewed: Difficulty walking, Unsteady gait, Limited mobility Additional Musculoskeletal Details: Getting PT. learning how to get off of floor at her request, back pain 2-3/10, awaiting [facility] Musculoskeletal Management Strategies: Routine screening Falls in the past year?: Yes Number of falls in past year: 2 or more Was there an injury with Fall?: Yes Fall Risk Category Calculator: 3 Patient Fall Risk Level: High Fall Risk Patient at Risk for Falls Due to: History of fall(s), Impaired balance/gait, Impaired mobility, Orthopedic patient Fall risk Follow up: Falls evaluation completed, Falls prevention discussed (Ancoagulent - ED for any head bumps)  Psychosocial Psychosocial Symptoms Reported: No symptoms reported     Quality of Family Relationships: supportive, helpful, involved Do you feel physically threatened by others?: No      10/08/2023    1:36 PM  Depression screen PHQ 2/9  Decreased Interest 0  Down, Depressed, Hopeless 0  PHQ - 2 Score 0    There were no vitals filed for this visit.  Medications Reviewed Today     Reviewed by Devra Lands, RN (Registered Nurse) on 10/08/23 at 1317  Med List Status: <None>   Medication Order Taking? Sig Documenting Provider Last Dose Status Informant  albuterol  (VENTOLIN  HFA) 108 (90 Base) MCG/ACT inhaler [id number] Yes Inhale 1-2 puffs into the lungs every 4  (four) hours as needed for wheezing or shortness of breath. Tapia, Leisa, PA-C  Active   azelastine  (ASTELIN )  0.1 % nasal spray [id number] Yes Place 2 sprays into both nostrils 2 (two) times daily. Use in each nostril as directed Wellington Curtis LABOR, FNP  Active   benzonatate  (TESSALON ) 100 MG capsule [id number]  Take 1-2 capsules (100-200 mg total) by mouth 3 (three) times daily as needed for cough.  Patient not taking: Reported on 10/08/2023   Tapia, Leisa, PA-C  Active   cetirizine (ZYRTEC) 10 MG tablet [id number] Yes Take 10 mg by mouth daily.  Patient taking differently: Take 10 mg by mouth daily as needed for allergies.   [provider]  Active Self, Child, Pharmacy Records           Med Note [name], [name]   Wed May 30, 2023  4:16 PM) Patient states taking every day  cholecalciferol  (VITAMIN D ) 400 units TABS tablet [id number] Yes Take 400 Units by mouth daily. [provider]  Active Self, Child, Pharmacy Records  conjugated estrogens  (PREMARIN ) vaginal cream [id number] Yes Apply 0.5mg  (pea-sized amount)  just inside the vaginal introitus with a finger-tip on  Monday, Wednesday and Friday nights. Helon Clotilda LABOR, PA-C  Active Self, Child, Pharmacy Records  EPINEPHrine  0.3 mg/0.3 mL IJ SOAJ injection [id number] Yes Inject 0.3 mLs into the skin once as needed for anaphylaxis. [provider]  Active Self, Child, Pharmacy Records  fluvoxaMINE  (LUVOX ) 50 MG tablet [id number] Yes Take 3 tablets (150 mg total) by mouth at bedtime. Myrla Jon HERO, MD  Active   folic acid  (FOLVITE ) 1 MG tablet [id number] Yes Take 1 mg by mouth daily. [provider]  Active Self, Child, Pharmacy Records  gabapentin  (NEURONTIN ) 100 MG capsule [id number] Yes Take 1 capsule (100 mg total) by mouth 2 (two) times daily. Bacigalupo, Angela M, MD  Active   golimumab  (SIMPONI  ARIA) 50 MG/4ML SOLN injection [id number] Yes Inject into the vein. [provider]  Active Self   guaiFENesin  200 MG tablet [id number]  Take 1 tablet (200 mg total) by mouth every 4 (four) hours as needed for cough or to loosen phlegm.  Patient not taking: Reported on 10/08/2023   Clifton, Kellie A, FNP  Active   hydrochlorothiazide  (HYDRODIURIL ) 25 MG tablet [id number] Yes Take 25 mg by mouth daily. [provider]  Active Self, Child, Pharmacy Records  hydrocortisone  cream 1 % [id number] Yes Apply 1 application  topically as needed for itching. [provider]  Active Self, Child, Pharmacy Records  ketoconazole  (NIZORAL ) 2 % cream [id number] Yes Apply 1 application topically daily. Myrla Jon HERO, MD  Active Self, Child, Pharmacy Records           Med Note [name], [facility]   Tue Jul 31, 2023  1:57 PM) Patient states uses as needed.   ketoconazole  (NIZORAL ) 2 % shampoo [id number] Yes Apply topically. [provider]  Active   levothyroxine  (SYNTHROID ) 125 MCG tablet [id number] Yes Take 1 tablet (125 mcg total) by mouth daily. Myrla Jon HERO, MD  Active   methotrexate  [facility]) 2.5 MG tablet [id number] Yes Take 20 mg by mouth every Thursday. [provider]  Active Self, Child, Pharmacy Records           Med Note WORLEY LUCIANO GORMAN Austin Nov 04, 2017 12:52 PM)    NYSTATIN  powder [id number] Yes APPLY TOPICALLY TO SKIN FOLDS THREE TIMES DAILY Emilio Kelly DASEN, FNP  Active   Omega-3 Fatty Acids (FISH OIL) 1000 MG CAPS [id number] Yes Take 1 capsule by mouth daily. [provider]  Active Self, Child, Pharmacy Records  omeprazole  (PRILOSEC) 20 MG capsule [id number] Yes TAKE 1 CAPSULE BY MOUTH TWICE  DAILY BEFORE MEALS Bacigalupo, Angela M, MD  Active   potassium chloride  SA (K-DUR,KLOR-CON ) 20 MEQ tablet [id number] Yes Take 1 tablet by mouth  daily Agapito Mom, MD  Active Self, Child, Pharmacy Records  predniSONE  (DELTASONE ) 20 MG tablet [id number]  2 tabs poqday 1-3, 1 tabs poqday 4-6  Patient not taking: Reported on 10/08/2023   Tapia, Leisa, PA-C   Active   propranolol  (INDERAL ) 40 MG tablet [id number] Yes 80 mg in the am and 40 mg at night [provider]  Active Self, Child, Pharmacy Records  Red Yeast Rice 600 MG CAPS [id number] Yes Take 1 capsule by mouth daily. [provider]  Active Self, Child, Pharmacy Records  rivaroxaban  (XARELTO ) 20 MG TABS tablet [id number] Yes Take 1 tablet (20 mg total) by mouth daily with supper. Please report any side effects to R leg following starting including worsening of bruise, new/changed edema, color variation or temperature change. Emilio Marseille T, FNP  Active   torsemide (DEMADEX) 20 MG tablet [id number] Yes Take 20 mg by mouth. Patient states she takes 1 tablet per day as needed [provider]  Active Self            Recommendation:   PCP Follow-up Continue Current Plan of Care  Follow Up Plan:   Telephone follow-up in 1 month  Nestora Duos, MSN, RN [facility]  [facility], [facility] RN Care Manager Direct Dial: [phone] Fax: [phone]

## 2023-10-10 DIAGNOSIS — G629 Polyneuropathy, unspecified: Secondary | ICD-10-CM | POA: Diagnosis not present

## 2023-10-10 DIAGNOSIS — M0579 Rheumatoid arthritis with rheumatoid factor of multiple sites without organ or systems involvement: Secondary | ICD-10-CM | POA: Diagnosis not present

## 2023-10-10 DIAGNOSIS — M792 Neuralgia and neuritis, unspecified: Secondary | ICD-10-CM | POA: Diagnosis not present

## 2023-10-10 DIAGNOSIS — N3946 Mixed incontinence: Secondary | ICD-10-CM | POA: Diagnosis not present

## 2023-10-10 DIAGNOSIS — I110 Hypertensive heart disease with heart failure: Secondary | ICD-10-CM | POA: Diagnosis not present

## 2023-10-10 DIAGNOSIS — I5032 Chronic diastolic (congestive) heart failure: Secondary | ICD-10-CM | POA: Diagnosis not present

## 2023-10-18 DIAGNOSIS — M792 Neuralgia and neuritis, unspecified: Secondary | ICD-10-CM | POA: Diagnosis not present

## 2023-10-18 DIAGNOSIS — I110 Hypertensive heart disease with heart failure: Secondary | ICD-10-CM | POA: Diagnosis not present

## 2023-10-18 DIAGNOSIS — I5032 Chronic diastolic (congestive) heart failure: Secondary | ICD-10-CM | POA: Diagnosis not present

## 2023-10-18 DIAGNOSIS — E559 Vitamin D deficiency, unspecified: Secondary | ICD-10-CM | POA: Diagnosis not present

## 2023-10-18 DIAGNOSIS — Z9181 History of falling: Secondary | ICD-10-CM | POA: Diagnosis not present

## 2023-10-18 DIAGNOSIS — M0579 Rheumatoid arthritis with rheumatoid factor of multiple sites without organ or systems involvement: Secondary | ICD-10-CM | POA: Diagnosis not present

## 2023-10-18 DIAGNOSIS — N3946 Mixed incontinence: Secondary | ICD-10-CM | POA: Diagnosis not present

## 2023-10-18 DIAGNOSIS — G629 Polyneuropathy, unspecified: Secondary | ICD-10-CM | POA: Diagnosis not present

## 2023-10-18 DIAGNOSIS — F429 Obsessive-compulsive disorder, unspecified: Secondary | ICD-10-CM | POA: Diagnosis not present

## 2023-10-18 DIAGNOSIS — G250 Essential tremor: Secondary | ICD-10-CM | POA: Diagnosis not present

## 2023-10-18 DIAGNOSIS — E669 Obesity, unspecified: Secondary | ICD-10-CM | POA: Diagnosis not present

## 2023-10-18 DIAGNOSIS — Z7409 Other reduced mobility: Secondary | ICD-10-CM | POA: Diagnosis not present

## 2023-10-18 DIAGNOSIS — M1990 Unspecified osteoarthritis, unspecified site: Secondary | ICD-10-CM | POA: Diagnosis not present

## 2023-10-18 DIAGNOSIS — K219 Gastro-esophageal reflux disease without esophagitis: Secondary | ICD-10-CM | POA: Diagnosis not present

## 2023-10-18 DIAGNOSIS — I4891 Unspecified atrial fibrillation: Secondary | ICD-10-CM | POA: Diagnosis not present

## 2023-10-18 DIAGNOSIS — Z7901 Long term (current) use of anticoagulants: Secondary | ICD-10-CM | POA: Diagnosis not present

## 2023-10-18 DIAGNOSIS — F418 Other specified anxiety disorders: Secondary | ICD-10-CM | POA: Diagnosis not present

## 2023-10-18 DIAGNOSIS — Z6834 Body mass index (BMI) 34.0-34.9, adult: Secondary | ICD-10-CM | POA: Diagnosis not present

## 2023-10-19 DIAGNOSIS — G629 Polyneuropathy, unspecified: Secondary | ICD-10-CM | POA: Diagnosis not present

## 2023-10-19 DIAGNOSIS — N3946 Mixed incontinence: Secondary | ICD-10-CM | POA: Diagnosis not present

## 2023-10-19 DIAGNOSIS — I110 Hypertensive heart disease with heart failure: Secondary | ICD-10-CM | POA: Diagnosis not present

## 2023-10-19 DIAGNOSIS — M792 Neuralgia and neuritis, unspecified: Secondary | ICD-10-CM | POA: Diagnosis not present

## 2023-10-19 DIAGNOSIS — I5032 Chronic diastolic (congestive) heart failure: Secondary | ICD-10-CM | POA: Diagnosis not present

## 2023-10-19 DIAGNOSIS — M0579 Rheumatoid arthritis with rheumatoid factor of multiple sites without organ or systems involvement: Secondary | ICD-10-CM | POA: Diagnosis not present

## 2023-10-25 DIAGNOSIS — I5032 Chronic diastolic (congestive) heart failure: Secondary | ICD-10-CM | POA: Diagnosis not present

## 2023-10-25 DIAGNOSIS — I110 Hypertensive heart disease with heart failure: Secondary | ICD-10-CM | POA: Diagnosis not present

## 2023-10-25 DIAGNOSIS — G629 Polyneuropathy, unspecified: Secondary | ICD-10-CM | POA: Diagnosis not present

## 2023-10-25 DIAGNOSIS — M792 Neuralgia and neuritis, unspecified: Secondary | ICD-10-CM | POA: Diagnosis not present

## 2023-10-25 DIAGNOSIS — M0579 Rheumatoid arthritis with rheumatoid factor of multiple sites without organ or systems involvement: Secondary | ICD-10-CM | POA: Diagnosis not present

## 2023-10-25 DIAGNOSIS — N3946 Mixed incontinence: Secondary | ICD-10-CM | POA: Diagnosis not present

## 2023-10-29 ENCOUNTER — Ambulatory Visit (INDEPENDENT_AMBULATORY_CARE_PROVIDER_SITE_OTHER): Admitting: Urology

## 2023-10-29 VITALS — BP 158/81 | HR 60 | Wt 216.4 lb

## 2023-10-29 DIAGNOSIS — N3946 Mixed incontinence: Secondary | ICD-10-CM | POA: Diagnosis not present

## 2023-10-29 LAB — MICROSCOPIC EXAMINATION
Epithelial Cells (non renal): >10 /HPF — AB (ref 0–10)
WBC, UA: >30 /HPF — AB (ref 0–5)

## 2023-10-29 LAB — URINALYSIS, COMPLETE
Bilirubin, UA: NEGATIVE
Glucose, UA: NEGATIVE
Ketones, UA: NEGATIVE
Nitrite, UA: POSITIVE — AB
Protein,UA: NEGATIVE
Specific Gravity, UA: 1.02 (ref 1.005–1.030)
Urobilinogen, Ur: 1 mg/dL (ref 0.2–1.0)
pH, UA: 6 (ref 5.0–7.5)

## 2023-10-29 MED ORDER — GEMTESA 75 MG PO TABS: 75.0000 mg | ORAL_TABLET | Freq: Every day | ORAL | 11 refills | Status: DC

## 2023-10-29 MED ORDER — GEMTESA 75 MG PO TABS: 75.0000 mg | ORAL_TABLET | Freq: Every day | ORAL | Status: AC

## 2023-10-29 NOTE — Progress Notes (Signed)
 10/29/2023 10:47 AM   Artemisa ONEIDA Leek 1940/03/24 [id number]  Referring provider: Myrla Jon HERO, MD [address] [place],  [place] [address]  Chief Complaint  Patient presents with   Urinary Incontinence    HPI: Penne: Urgency incontinence and urinary tract infections.  Estrogen cream expensive.  Positive cultures.  Normal cystoscopy and cystoscopy in 2022 for blood in the urine  Today Patient has urge incontinence and foot on the floor syndrome as soon as he gets to the toilet.  Sometimes leaks with coughing sneezing.  Wears 3-4 pads a day.  No bedwetting  Voids hourly and gets up at least 4 times at night  Has not had bladder infections in 3 years and uses estrogen cream once or twice a week but not every week  No bladder surgery and no treatment   PMH: Past Medical History:  Diagnosis Date   A-fib (HCC)    Arthritis    Bell palsy 09/04/2014   Congestion of nasal sinus 02/14/2023   Cough with sputum 02/14/2023   GERD (gastroesophageal reflux disease)    Heart disease    Rheumatoid arthritis (HCC)    Rhinosinusitis 02/14/2023   Thyroid  disease    Tremors of nervous system     Surgical History: Past Surgical History:  Procedure Laterality Date   BREAST BIOPSY Left 1988   Benign   BREAST BIOPSY Left 1987   Benign   BREAST EXCISIONAL BIOPSY     BROW LIFT Bilateral 05/23/2016   Procedure: BLEPHAROPLASTY upper eyelid; w/excess skin;  Surgeon: Greig HERO Gay, MD;  Location: [facility];  Service: Ophthalmology;  Laterality: Bilateral;   CARPAL TUNNEL RELEASE Bilateral    CHOLECYSTECTOMY     Dr. Dessa   COLONOSCOPY  2006   EYE SURGERY     JOINT REPLACEMENT Bilateral    knees   REPLACEMENT TOTAL KNEE Left 2005   REPLACEMENT TOTAL KNEE Right 1998    Home Medications:  Allergies as of 10/29/2023       Reactions   Bactrim  [sulfamethoxazole -trimethoprim ] Anaphylaxis   Mirabegron  Rash   Patient reported it hospitalized her and  caused bradycardia   Macrobid  [nitrofurantoin  Macrocrystal] Rash   Oxybutynin  Rash   Penicillins Rash   Has patient had a PCN reaction causing immediate rash, facial/tongue/throat swelling, SOB or lightheadedness with hypotension: Yes Has patient had a PCN reaction causing severe rash involving mucus membranes or skin necrosis: No Has patient had a PCN reaction that required hospitalization: No Has patient had a PCN reaction occurring within the last 10 years: Unknown If all of the above answers are NO, then may proceed with Cephalosporin use.        Medication List        Accurate as of October 29, 2023 10:47 AM. If you have any questions, ask your nurse or doctor.          albuterol  108 (90 Base) MCG/ACT inhaler Commonly known as: VENTOLIN  HFA Inhale 1-2 puffs into the lungs every 4 (four) hours as needed for wheezing or shortness of breath.   azelastine  0.1 % nasal spray Commonly known as: ASTELIN  Place 2 sprays into both nostrils 2 (two) times daily. Use in each nostril as directed   benzonatate  100 MG capsule Commonly known as: TESSALON  Take 1-2 capsules (100-200 mg total) by mouth 3 (three) times daily as needed for cough.   cetirizine 10 MG tablet Commonly known as: ZYRTEC Take 10 mg by mouth daily. What changed:  when  to take this reasons to take this   cholecalciferol  10 MCG (400 UNIT) Tabs tablet Commonly known as: VITAMIN D3 Take 400 Units by mouth daily.   clobetasol 0.05 % external solution Commonly known as: TEMOVATE APPLY TOPICALLY TWICE DAILY AS  NEEDED FOR ITCHING AND FLAKING  TO THE SCALP AND EARS. USE IT  UNTIL CLEAR OR FOR NO MORE THAN  2 WEEKS. RESTART AS NEEDED   EPINEPHrine  0.3 mg/0.3 mL Soaj injection Commonly known as: EPI-PEN Inject 0.3 mLs into the skin once as needed for anaphylaxis.   Fish Oil 1000 MG Caps Take 1 capsule by mouth daily.   fluvoxaMINE  50 MG tablet Commonly known as: LUVOX  Take 3 tablets (150 mg total) by mouth at  bedtime.   folic acid  1 MG tablet Commonly known as: FOLVITE  Take 1 mg by mouth daily.   gabapentin  100 MG capsule Commonly known as: NEURONTIN  Take 1 capsule (100 mg total) by mouth 2 (two) times daily.   guaiFENesin  200 MG tablet Take 1 tablet (200 mg total) by mouth every 4 (four) hours as needed for cough or to loosen phlegm.   hydrochlorothiazide  25 MG tablet Commonly known as: HYDRODIURIL  Take 25 mg by mouth daily.   hydrocortisone  cream 1 % Apply 1 application  topically as needed for itching.   ketoconazole  2 % cream Commonly known as: NIZORAL  Apply 1 application topically daily.   ketoconazole  2 % shampoo Commonly known as: NIZORAL  Apply topically.   levothyroxine  125 MCG tablet Commonly known as: SYNTHROID  Take 1 tablet (125 mcg total) by mouth daily.   methotrexate  2.5 MG tablet Commonly known as: RHEUMATREX Take 20 mg by mouth every Thursday.   nystatin  powder Generic drug: nystatin  APPLY TOPICALLY TO SKIN FOLDS THREE TIMES DAILY   omeprazole  20 MG capsule Commonly known as: PRILOSEC TAKE 1 CAPSULE BY MOUTH TWICE  DAILY BEFORE MEALS   potassium chloride  SA 20 MEQ tablet Commonly known as: KLOR-CON  M Take 1 tablet by mouth  daily   predniSONE  20 MG tablet Commonly known as: DELTASONE  2 tabs poqday 1-3, 1 tabs poqday 4-6   Premarin  vaginal cream Generic drug: conjugated estrogens  Apply 0.5mg  (pea-sized amount)  just inside the vaginal introitus with a finger-tip on  Monday, Wednesday and Friday nights.   propranolol  40 MG tablet Commonly known as: INDERAL  80 mg in the am and 40 mg at night   Red Yeast Rice 600 MG Caps Take 1 capsule by mouth daily.   rivaroxaban  20 MG Tabs tablet Commonly known as: XARELTO  Take 1 tablet (20 mg total) by mouth daily with supper. Please report any side effects to R leg following starting including worsening of bruise, new/changed edema, color variation or temperature change.   Simponi  Aria 50 MG/4ML Soln  injection Generic drug: golimumab  Inject into the vein.   torsemide 20 MG tablet Commonly known as: DEMADEX Take 20 mg by mouth. Patient states she takes 1 tablet per day as needed        Allergies:  Allergies  Allergen Reactions   Bactrim  [Sulfamethoxazole -Trimethoprim ] Anaphylaxis   Mirabegron  Rash    Patient reported it hospitalized her and caused bradycardia   Macrobid  [Nitrofurantoin  Macrocrystal] Rash   Oxybutynin  Rash   Penicillins Rash    Has patient had a PCN reaction causing immediate rash, facial/tongue/throat swelling, SOB or lightheadedness with hypotension: Yes Has patient had a PCN reaction causing severe rash involving mucus membranes or skin necrosis: No Has patient had a PCN reaction that required hospitalization: No  Has patient had a PCN reaction occurring within the last 10 years: Unknown If all of the above answers are NO, then may proceed with Cephalosporin use.     Family History: Family History  Problem Relation Age of Onset   Stroke Mother    Hypertension Mother    Heart disease Father    Arthritis Father    Parkinson's disease Sister    Cancer Sister    Parkinson's disease Brother    Bladder Cancer Brother    Breast cancer Neg Hx    Kidney cancer Neg Hx     Social History:  reports that she has never smoked. She has never used smokeless tobacco. She reports that she does not drink alcohol and does not use drugs.  ROS:                                        Physical Exam: BP (!) 158/81 (BP Location: Left Arm, Patient Position: Sitting, Cuff Size: Large)   Pulse 60   Wt 98.2 kg   SpO2 96%   BMI 36.01 kg/m   Constitutional:  Alert and oriented, No acute distress. HEENT: [place] AT, moist mucus membranes.  Trachea midline, no masses.   Laboratory Data: Lab Results  Component Value Date   WBC 8.4 08/03/2023   HGB 10.2 (L) 08/03/2023   HCT 33.4 (L) 08/03/2023   MCV 87 08/03/2023   PLT 320 08/03/2023    Lab  Results  Component Value Date   CREATININE 0.75 08/03/2023    No results found for: PSA  No results found for: TESTOSTERONE  Lab Results  Component Value Date   HGBA1C 5.6 08/03/2023    Urinalysis    Component Value Date/Time   COLORURINE YELLOW (A) 10/23/2021 1036   APPEARANCEUR CLEAR (A) 10/23/2021 1036   APPEARANCEUR Cloudy (A) 01/20/2021 0902   LABSPEC 1.026 10/23/2021 1036   PHURINE 5.0 10/23/2021 1036   GLUCOSEU NEGATIVE 10/23/2021 1036   HGBUR SMALL (A) 10/23/2021 1036   BILIRUBINUR NEGATIVE 10/23/2021 1036   BILIRUBINUR Negative 01/20/2021 0902   KETONESUR NEGATIVE 10/23/2021 1036   PROTEINUR NEGATIVE 10/23/2021 1036   UROBILINOGEN >=8.0 (A) 11/11/2020 1334   NITRITE NEGATIVE 10/23/2021 1036   LEUKOCYTESUR TRACE (A) 10/23/2021 1036    Pertinent Imaging: Urine reviewed and sent for culture chart review  Assessment & Plan: Patient will be treated as an overactive bladder.  I will hold off on urodynamics for now.  Gemtesa  samples and prescription and come back in check postvoid residual and perform pelvic examination cystoscopy.  Call if culture positive  She tried to get her son on the phone who is a retired Emergency planning/management officer.  She asked about the pure wick but I think this could be a last choice and she does not have bedwetting  1. Mixed incontinence (Primary)  - Urinalysis, Complete   No follow-ups on file.  Glendia DELENA Elizabeth, MD  [facility] [address], [address] [place], [place] [address] [phone]

## 2023-10-29 NOTE — Patient Instructions (Signed)

## 2023-10-29 NOTE — Addendum Note (Signed)
 Addended byBETHA CORIE PLATER on: 10/29/2023 03:48 PM   Modules accepted: Orders

## 2023-10-30 ENCOUNTER — Telehealth: Payer: Self-pay | Admitting: Family Medicine

## 2023-10-30 NOTE — Telephone Encounter (Signed)
 Pt advised. Reports she spoke with someone this morning

## 2023-10-30 NOTE — Telephone Encounter (Signed)
 Copied from CRM [id number]. Topic: General - Other >> Oct 30, 2023  9:52 AM Myrick T wrote: Reason for CRM: Curtistine from [employer] called stated they have made many attempts to contact the patient with no success so they have place the order for the shower chair on hold. Please f/u with patient and have her contact [employer] [facility] at [phone].

## 2023-11-02 ENCOUNTER — Other Ambulatory Visit: Payer: Self-pay

## 2023-11-02 DIAGNOSIS — I5032 Chronic diastolic (congestive) heart failure: Secondary | ICD-10-CM | POA: Diagnosis not present

## 2023-11-02 DIAGNOSIS — M792 Neuralgia and neuritis, unspecified: Secondary | ICD-10-CM | POA: Diagnosis not present

## 2023-11-02 DIAGNOSIS — I110 Hypertensive heart disease with heart failure: Secondary | ICD-10-CM | POA: Diagnosis not present

## 2023-11-02 DIAGNOSIS — G629 Polyneuropathy, unspecified: Secondary | ICD-10-CM | POA: Diagnosis not present

## 2023-11-02 DIAGNOSIS — M0579 Rheumatoid arthritis with rheumatoid factor of multiple sites without organ or systems involvement: Secondary | ICD-10-CM | POA: Diagnosis not present

## 2023-11-02 DIAGNOSIS — N3946 Mixed incontinence: Secondary | ICD-10-CM | POA: Diagnosis not present

## 2023-11-02 LAB — CULTURE, URINE COMPREHENSIVE

## 2023-11-02 NOTE — Patient Instructions (Signed)
 Visit Information  Thank you for taking time to visit with me today. Please don't hesitate to contact me if I can be of assistance to you before our next scheduled appointment.  Your next care management appointment is by telephone on 11/30/23 at 11:30 am  Telephone follow-up in 1 month  Please call the care guide team at [phone] if you need to cancel, schedule, or reschedule an appointment.   Please call the Suicide and Crisis Lifeline: 988 call the [place]  National Suicide Prevention Lifeline: 920-176-1330 or TTY: 225-236-6565 TTY (206) 366-8756) to talk to a trained counselor call 1-800-273-TALK (toll free, 24 hour hotline) go to [facility] [address] [place] [phone]) call 911 if you are experiencing a Mental Health or Behavioral Health Crisis or need someone to talk to.  Nestora Duos, MSN, RN [place]  [facility], [facility] RN Care Manager Direct Dial: [phone] Fax: [phone]   Fall Prevention in the Home, Adult Falls can cause injuries and can happen to people of all ages. There are many things you can do to make your home safer and to help prevent falls. What actions can I take to prevent falls? General information Use good lighting in all rooms. Make sure to: Replace any light bulbs that burn out. Turn on the lights in dark areas and use night-lights. Keep items that you use often in easy-to-reach places. Lower the shelves around your home if needed. Move furniture so that there are clear paths around it. Do not use throw rugs or other things on the floor that can make you trip. If any of your floors are uneven, fix them. Add color or contrast paint or tape to clearly mark and help you see: Grab bars or handrails. First and last steps of staircases. Where the edge of each step is. If you use a ladder or stepladder: Make sure that it is fully opened. Do not climb a closed  ladder. Make sure the sides of the ladder are locked in place. Have someone hold the ladder while you use it. Know where your pets are as you move through your home. What can I do in the bathroom?     Keep the floor dry. Clean up any water on the floor right away. Remove soap buildup in the bathtub or shower. Buildup makes bathtubs and showers slippery. Use non-skid mats or decals on the floor of the bathtub or shower. Attach bath mats securely with double-sided, non-slip rug tape. If you need to sit down in the shower, use a non-slip stool. Install grab bars by the toilet and in the bathtub and shower. Do not use towel bars as grab bars. What can I do in the bedroom? Make sure that you have a light by your bed that is easy to reach. Do not use any sheets or blankets on your bed that hang to the floor. Have a firm chair or bench with side arms that you can use for support when you get dressed. What can I do in the kitchen? Clean up any spills right away. If you need to reach something above you, use a step stool with a grab bar. Keep electrical cords out of the way. Do not use floor polish or wax that makes floors slippery. What can I do with my stairs? Do not leave anything on the stairs. Make sure that you have a light switch at the top and the bottom of the stairs. Make sure that there are  handrails on both sides of the stairs. Fix handrails that are broken or loose. Install non-slip stair treads on all your stairs if they do not have carpet. Avoid having throw rugs at the top or bottom of the stairs. Choose a carpet that does not hide the edge of the steps on the stairs. Make sure that the carpet is firmly attached to the stairs. Fix carpet that is loose or worn. What can I do on the outside of my home? Use bright outdoor lighting. Fix the edges of walkways and driveways and fix any cracks. Clear paths of anything that can make you trip, such as tools or rocks. Add color or  contrast paint or tape to clearly mark and help you see anything that might make you trip as you walk through a door, such as a raised step or threshold. Trim any bushes or trees on paths to your home. Check to see if handrails are loose or broken and that both sides of all steps have handrails. Install guardrails along the edges of any raised decks and porches. Have leaves, snow, or ice cleared regularly. Use sand, salt, or ice melter on paths if you live where there is ice and snow during the winter. Clean up any spills in your garage right away. This includes grease or oil spills. What other actions can I take? Review your medicines with your doctor. Some medicines can cause dizziness or changes in blood pressure, which increase your risk of falling. Wear shoes that: Have a low heel. Do not wear high heels. Have rubber bottoms and are closed at the toe. Feel good on your feet and fit well. Use tools that help you move around if needed. These include: Canes. Walkers. Scooters. Crutches. Ask your doctor what else you can do to help prevent falls. This may include seeing a physical therapist to learn to do exercises to move better and get stronger. Where to find more information Centers for Disease Control and Prevention, STEADI: TonerPromos.no [employer] on Aging: BaseRingTones.pl National Institute on Aging: BaseRingTones.pl Contact a doctor if: You are afraid of falling at home. You feel weak, drowsy, or dizzy at home. You fall at home. Get help right away if you: Lose consciousness or have trouble moving after a fall. Have a fall that causes a head injury. These symptoms may be an emergency. Get help right away. Call 911. Do not wait to see if the symptoms will go away. Do not drive yourself to the hospital. This information is not intended to replace advice given to you by your health care provider. Make sure you discuss any questions you have with your health care provider. Document  Revised: 10/24/2021 Document Reviewed: 10/24/2021 Elsevier Patient Education  2024 [employer].

## 2023-11-02 NOTE — Patient Outreach (Signed)
 Complex Care Management   Visit Note  11/02/2023  Name:  Glenda Williams MRN: [id number] DOB: [birth year]  Situation: Referral received for Complex Care Management related to Heart Failure and RA and Falls I obtained verbal consent from Patient.  Visit completed with Patient  on the phone  Background:   Past Medical History:  Diagnosis Date   A-fib [facility])    Arthritis    Bell palsy 09/04/2014   Congestion of nasal sinus 02/14/2023   Cough with sputum 02/14/2023   GERD (gastroesophageal reflux disease)    Heart disease    Rheumatoid arthritis (HCC)    Rhinosinusitis 02/14/2023   Thyroid  disease    Tremors of nervous system     Assessment: Patient Reported Symptoms:  Cognitive Cognitive Status: No symptoms reported Cognitive/Intellectual Conditions Management [RPT]: None reported or documented in medical history or problem list   Health Maintenance Behaviors: Annual physical exam Healing Pattern: Average Health Facilitated by: Rest, Healthy diet  Neurological Neurological Review of Symptoms: No symptoms reported Neurological Management Strategies: Routine screening  HEENT HEENT Symptoms Reported: Not assessed      Cardiovascular Cardiovascular Symptoms Reported: Swelling in legs or feet Patient's Recent BP reading at home: vascular appointment completed, extra torsemide as needed - not needing recently, reviewed CHF action plan when to call provider and red flags for ED Weight: 216 lb (98 kg) Cardiovascular Self-Management Outcome: 4 (good)  Respiratory Respiratory Symptoms Reported: No symptoms reported Additional Respiratory Details: seasonal allergies controlled with meds Respiratory Management Strategies: Routine screening, Medication therapy  Endocrine Endocrine Symptoms Reported: No symptoms reported Is patient diabetic?: No    Gastrointestinal Gastrointestinal Symptoms Reported: Constipation, Incontinence, Reflux/heartburn Gastrointestinal Management Strategies:  Incontinence garment/pad    Genitourinary Genitourinary Symptoms Reported: Incontinence, Urgency Additional Genitourinary Details: Gemtesa  samples received - aware to let doc know if no improvement, night is improving, still with urgency during the day Genitourinary Management Strategies: Incontinence garment/pad, Medication therapy  Integumentary Integumentary Symptoms Reported: Bruising Other Integumentary Symptoms: fall 2 days ago, brusing on shoulder and by eye, discussed ED for hitting face head with anticoagulent Additional Integumentary Details: no open areas on bottom Skin Management Strategies: Routine screening  Musculoskeletal Musculoskelatal Symptoms Reviewed: Difficulty walking, Limited mobility, Unsteady gait, Back pain Additional Musculoskeletal Details: Still getting PT able to get self after a fall 2 days ago, reports caught toe on concrete, using cane/walker and just received transport chair, BSC and shower chair coming Musculoskeletal Management Strategies: Routine screening Falls in the past year?: Yes Number of falls in past year: 2 or more Was there an injury with Fall?: Yes Fall Risk Category Calculator: 3 Patient Fall Risk Level: High Fall Risk Patient at Risk for Falls Due to: History of fall(s), Impaired balance/gait, Impaired mobility, Orthopedic patient Fall risk Follow up: Falls evaluation completed, Education provided, Falls prevention discussed (Anticoagulent - ED for head bumps)  Psychosocial Psychosocial Symptoms Reported: No symptoms reported Behavioral Health Comment: just can't go fast enough Major Change/Loss/Stressor/Fears (CP): Medical condition, self Techniques to Cope with Loss/Stress/Change: Diversional activities, Exercise Quality of Family Relationships: helpful, supportive, involved Do you feel physically threatened by others?: No    11/02/2023    PHQ2-9 Depression Screening   Little interest or pleasure in doing things Not at all  Feeling  down, depressed, or hopeless Not at all  PHQ-2 - Total Score 0  Trouble falling or staying asleep, or sleeping too much    Feeling tired or having little energy    Poor appetite or overeating  Feeling bad about yourself - or that you are a failure or have let yourself or your family down    Trouble concentrating on things, such as reading the newspaper or watching television    Moving or speaking so slowly that other people could have noticed.  Or the opposite - being so fidgety or restless that you have been moving around a lot more than usual    Thoughts that you would be better off dead, or hurting yourself in some way    PHQ2-9 Total Score    If you checked off any problems, how difficult have these problems made it for you to do your work, take care of things at home, or get along with other people    Depression Interventions/Treatment      There were no vitals filed for this visit.  Medications Reviewed Today     Reviewed by Devra Lands, RN (Registered Nurse) on 11/02/23 at 1141  Med List Status: <None>   Medication Order Taking? Sig Documenting Provider Last Dose Status Informant  albuterol  (VENTOLIN  HFA) 108 (90 Base) MCG/ACT inhaler [id number] Yes Inhale 1-2 puffs into the lungs every 4 (four) hours as needed for wheezing or shortness of breath. Tapia, Leisa, PA-C  Active   azelastine  (ASTELIN ) 0.1 % nasal spray [id number] Yes Place 2 sprays into both nostrils 2 (two) times daily. Use in each nostril as directed Wellington Curtis LABOR, FNP  Active   benzonatate  (TESSALON ) 100 MG capsule [id number]  Take 1-2 capsules (100-200 mg total) by mouth 3 (three) times daily as needed for cough.  Patient not taking: Reported on 10/29/2023   Tapia, Leisa, PA-C  Active   cetirizine (ZYRTEC) 10 MG tablet [id number]  Take 10 mg by mouth daily.  Patient taking differently: Take 10 mg by mouth daily as needed for allergies.   [provider]  Active Self, Child, Pharmacy Records            Med Note [name], [name]   Wed May 30, 2023  4:16 PM) Patient states taking every day  cholecalciferol  (VITAMIN D ) 400 units TABS tablet [id number] Yes Take 400 Units by mouth daily. [provider]  Active Self, Child, Pharmacy Records  clobetasol (TEMOVATE) 0.05 % external solution [id number] Yes APPLY TOPICALLY TWICE DAILY AS  NEEDED FOR ITCHING AND FLAKING  TO THE SCALP AND EARS. USE IT  UNTIL CLEAR OR FOR NO MORE THAN  2 [place]. RESTART AS NEEDED [provider]  Active   conjugated estrogens  (PREMARIN ) vaginal cream [id number] Yes Apply 0.5mg  (pea-sized amount)  just inside the vaginal introitus with a finger-tip on  Monday, Wednesday and Friday nights. Helon Clotilda LABOR, PA-C  Active Self, Child, Pharmacy Records  EPINEPHrine  0.3 mg/0.3 mL IJ SOAJ injection [id number]  Inject 0.3 mLs into the skin once as needed for anaphylaxis. [provider]  Active Self, Child, Pharmacy Records  fluvoxaMINE  (LUVOX ) 50 MG tablet [id number] Yes Take 3 tablets (150 mg total) by mouth at bedtime. Bacigalupo, Angela M, MD  Active   folic acid  (FOLVITE ) 1 MG tablet [id number] Yes Take 1 mg by mouth daily. [provider]  Active Self, Child, Pharmacy Records  gabapentin  (NEURONTIN ) 100 MG capsule [id number] Yes Take 1 capsule (100 mg total) by mouth 2 (two) times daily. Bacigalupo, Angela M, MD  Active   golimumab  (SIMPONI  ARIA) 50 MG/4ML SOLN injection [id number] Yes Inject into the vein. [provider]  Active Self  guaiFENesin  200 MG tablet [id number]  Take  1 tablet (200 mg total) by mouth every 4 (four) hours as needed for cough or to loosen phlegm.  Patient not taking: Reported on 10/29/2023   Wellington Curtis LABOR, FNP  Active   hydrochlorothiazide  (HYDRODIURIL ) 25 MG tablet [id number] Yes Take 25 mg by mouth daily. [provider]  Active Self, Child, Pharmacy Records  hydrocortisone  cream 1 % [id number] Yes Apply 1 application  topically as needed for itching.  [provider]  Active Self, Child, Pharmacy Records  ketoconazole  (NIZORAL ) 2 % cream [id number] Yes Apply 1 application topically daily. Myrla Jon HERO, MD  Active Self, Child, Pharmacy Records           Med Note [name], [facility] E   Tue Jul 31, 2023  1:57 PM) Patient states uses as needed.   ketoconazole  (NIZORAL ) 2 % shampoo [id number] Yes Apply topically. [provider]  Active   levothyroxine  (SYNTHROID ) 125 MCG tablet [id number] Yes Take 1 tablet (125 mcg total) by mouth daily. Myrla Jon HERO, MD  Active   methotrexate  [facility]) 2.5 MG tablet [id number] Yes Take 20 mg by mouth every Thursday. [provider]  Active Self, Child, Pharmacy Records           Med Note WORLEY LUCIANO GORMAN [place] Nov 04, 2017 12:52 PM)    NYSTATIN  powder [id number] Yes APPLY TOPICALLY TO SKIN FOLDS THREE TIMES DAILY Emilio Kelly DASEN, FNP  Active   Omega-3 Fatty Acids (FISH OIL) 1000 MG CAPS [id number] Yes Take 1 capsule by mouth daily. [provider]  Active Self, Child, Pharmacy Records  omeprazole  (PRILOSEC) 20 MG capsule [id number] Yes TAKE 1 CAPSULE BY MOUTH TWICE  DAILY BEFORE MEALS Bacigalupo, Jon HERO, MD  Active   potassium chloride  SA (K-DUR,KLOR-CON ) 20 MEQ tablet [id number]  Take 1 tablet by mouth  daily  Patient not taking: Reported on 11/02/2023   Agapito Mom, MD  Active Self, Child, Pharmacy Records  predniSONE  (DELTASONE ) 20 MG tablet [id number]  2 tabs poqday 1-3, 1 tabs poqday 4-6  Patient not taking: Reported on 10/29/2023   Tapia, Leisa, PA-C  Active   propranolol  (INDERAL ) 40 MG tablet [id number] Yes 80 mg in the am and 40 mg at night [provider]  Active Self, Child, Pharmacy Records  Red Yeast Rice 600 MG CAPS [id number] Yes Take 1 capsule by mouth daily. [provider]  Active Self, Child, Pharmacy Records  rivaroxaban  (XARELTO ) 20 MG TABS tablet [id number] Yes Take 1 tablet (20 mg total) by mouth daily with supper. Please report  any side effects to R leg following starting including worsening of bruise, new/changed edema, color variation or temperature change. Emilio Kelly T, FNP  Active   torsemide (DEMADEX) 20 MG tablet [id number] Yes Take 20 mg by mouth. Patient states she takes 1 tablet per day as needed [provider]  Active Self  Vibegron  (GEMTESA ) 75 MG TABS [id number] Yes Take 1 tablet (75 mg total) by mouth daily for 28 days. Gaston Hamilton, MD  Active             Recommendation:   PCP Follow-up Continue Current Plan of Care  Follow Up Plan:   Telephone follow-up in 1 month  Nestora Duos, MSN, RN [facility]  [facility], [facility] RN Care Manager Direct Dial: [phone] Fax: [phone]

## 2023-11-06 ENCOUNTER — Telehealth: Payer: Self-pay

## 2023-11-06 DIAGNOSIS — M0579 Rheumatoid arthritis with rheumatoid factor of multiple sites without organ or systems involvement: Secondary | ICD-10-CM | POA: Diagnosis not present

## 2023-11-06 NOTE — Telephone Encounter (Signed)
 FYI   Copied from CRM [id number]. Topic: Clinical - Home Health Verbal Orders >> Nov 02, 2023  2:54 PM Delon DASEN wrote: Caller/Agency: Curlee with [facility] Number: [phone] Service Requested: Physical Therapy Frequency: n/a Any new concerns about the patient? Yes- bruising on right shoulder and cheek from fall on Tuesday, family helped her up, no ER

## 2023-11-06 NOTE — Telephone Encounter (Signed)
 Noted

## 2023-11-09 DIAGNOSIS — M0579 Rheumatoid arthritis with rheumatoid factor of multiple sites without organ or systems involvement: Secondary | ICD-10-CM | POA: Diagnosis not present

## 2023-11-09 DIAGNOSIS — N3946 Mixed incontinence: Secondary | ICD-10-CM | POA: Diagnosis not present

## 2023-11-09 DIAGNOSIS — I5032 Chronic diastolic (congestive) heart failure: Secondary | ICD-10-CM | POA: Diagnosis not present

## 2023-11-09 DIAGNOSIS — G629 Polyneuropathy, unspecified: Secondary | ICD-10-CM | POA: Diagnosis not present

## 2023-11-09 DIAGNOSIS — M792 Neuralgia and neuritis, unspecified: Secondary | ICD-10-CM | POA: Diagnosis not present

## 2023-11-09 DIAGNOSIS — I110 Hypertensive heart disease with heart failure: Secondary | ICD-10-CM | POA: Diagnosis not present

## 2023-11-12 DIAGNOSIS — M792 Neuralgia and neuritis, unspecified: Secondary | ICD-10-CM | POA: Diagnosis not present

## 2023-11-12 DIAGNOSIS — G629 Polyneuropathy, unspecified: Secondary | ICD-10-CM | POA: Diagnosis not present

## 2023-11-12 DIAGNOSIS — M0579 Rheumatoid arthritis with rheumatoid factor of multiple sites without organ or systems involvement: Secondary | ICD-10-CM | POA: Diagnosis not present

## 2023-11-12 DIAGNOSIS — N3946 Mixed incontinence: Secondary | ICD-10-CM | POA: Diagnosis not present

## 2023-11-12 DIAGNOSIS — I110 Hypertensive heart disease with heart failure: Secondary | ICD-10-CM | POA: Diagnosis not present

## 2023-11-12 DIAGNOSIS — I5032 Chronic diastolic (congestive) heart failure: Secondary | ICD-10-CM | POA: Diagnosis not present

## 2023-11-17 DIAGNOSIS — M0579 Rheumatoid arthritis with rheumatoid factor of multiple sites without organ or systems involvement: Secondary | ICD-10-CM | POA: Diagnosis not present

## 2023-11-17 DIAGNOSIS — M792 Neuralgia and neuritis, unspecified: Secondary | ICD-10-CM | POA: Diagnosis not present

## 2023-11-17 DIAGNOSIS — K219 Gastro-esophageal reflux disease without esophagitis: Secondary | ICD-10-CM | POA: Diagnosis not present

## 2023-11-17 DIAGNOSIS — Z6834 Body mass index (BMI) 34.0-34.9, adult: Secondary | ICD-10-CM | POA: Diagnosis not present

## 2023-11-17 DIAGNOSIS — M1990 Unspecified osteoarthritis, unspecified site: Secondary | ICD-10-CM | POA: Diagnosis not present

## 2023-11-17 DIAGNOSIS — F429 Obsessive-compulsive disorder, unspecified: Secondary | ICD-10-CM | POA: Diagnosis not present

## 2023-11-17 DIAGNOSIS — G629 Polyneuropathy, unspecified: Secondary | ICD-10-CM | POA: Diagnosis not present

## 2023-11-17 DIAGNOSIS — N3946 Mixed incontinence: Secondary | ICD-10-CM | POA: Diagnosis not present

## 2023-11-17 DIAGNOSIS — Z7901 Long term (current) use of anticoagulants: Secondary | ICD-10-CM | POA: Diagnosis not present

## 2023-11-17 DIAGNOSIS — I4891 Unspecified atrial fibrillation: Secondary | ICD-10-CM | POA: Diagnosis not present

## 2023-11-17 DIAGNOSIS — E559 Vitamin D deficiency, unspecified: Secondary | ICD-10-CM | POA: Diagnosis not present

## 2023-11-17 DIAGNOSIS — G250 Essential tremor: Secondary | ICD-10-CM | POA: Diagnosis not present

## 2023-11-17 DIAGNOSIS — Z7409 Other reduced mobility: Secondary | ICD-10-CM | POA: Diagnosis not present

## 2023-11-17 DIAGNOSIS — I110 Hypertensive heart disease with heart failure: Secondary | ICD-10-CM | POA: Diagnosis not present

## 2023-11-17 DIAGNOSIS — F418 Other specified anxiety disorders: Secondary | ICD-10-CM | POA: Diagnosis not present

## 2023-11-17 DIAGNOSIS — E669 Obesity, unspecified: Secondary | ICD-10-CM | POA: Diagnosis not present

## 2023-11-17 DIAGNOSIS — Z9181 History of falling: Secondary | ICD-10-CM | POA: Diagnosis not present

## 2023-11-17 DIAGNOSIS — I5032 Chronic diastolic (congestive) heart failure: Secondary | ICD-10-CM | POA: Diagnosis not present

## 2023-11-19 DIAGNOSIS — I4891 Unspecified atrial fibrillation: Secondary | ICD-10-CM | POA: Diagnosis not present

## 2023-11-19 DIAGNOSIS — I110 Hypertensive heart disease with heart failure: Secondary | ICD-10-CM | POA: Diagnosis not present

## 2023-11-19 DIAGNOSIS — E559 Vitamin D deficiency, unspecified: Secondary | ICD-10-CM | POA: Diagnosis not present

## 2023-11-19 DIAGNOSIS — F418 Other specified anxiety disorders: Secondary | ICD-10-CM | POA: Diagnosis not present

## 2023-11-19 DIAGNOSIS — N3946 Mixed incontinence: Secondary | ICD-10-CM | POA: Diagnosis not present

## 2023-11-19 DIAGNOSIS — M1990 Unspecified osteoarthritis, unspecified site: Secondary | ICD-10-CM | POA: Diagnosis not present

## 2023-11-19 DIAGNOSIS — I5032 Chronic diastolic (congestive) heart failure: Secondary | ICD-10-CM | POA: Diagnosis not present

## 2023-11-19 DIAGNOSIS — M792 Neuralgia and neuritis, unspecified: Secondary | ICD-10-CM | POA: Diagnosis not present

## 2023-11-19 DIAGNOSIS — F429 Obsessive-compulsive disorder, unspecified: Secondary | ICD-10-CM | POA: Diagnosis not present

## 2023-11-19 DIAGNOSIS — G250 Essential tremor: Secondary | ICD-10-CM | POA: Diagnosis not present

## 2023-11-19 DIAGNOSIS — M0579 Rheumatoid arthritis with rheumatoid factor of multiple sites without organ or systems involvement: Secondary | ICD-10-CM | POA: Diagnosis not present

## 2023-11-19 DIAGNOSIS — G629 Polyneuropathy, unspecified: Secondary | ICD-10-CM | POA: Diagnosis not present

## 2023-11-26 DIAGNOSIS — M792 Neuralgia and neuritis, unspecified: Secondary | ICD-10-CM | POA: Diagnosis not present

## 2023-11-26 DIAGNOSIS — N3946 Mixed incontinence: Secondary | ICD-10-CM | POA: Diagnosis not present

## 2023-11-26 DIAGNOSIS — G629 Polyneuropathy, unspecified: Secondary | ICD-10-CM | POA: Diagnosis not present

## 2023-11-26 DIAGNOSIS — I5032 Chronic diastolic (congestive) heart failure: Secondary | ICD-10-CM | POA: Diagnosis not present

## 2023-11-26 DIAGNOSIS — M0579 Rheumatoid arthritis with rheumatoid factor of multiple sites without organ or systems involvement: Secondary | ICD-10-CM | POA: Diagnosis not present

## 2023-11-26 DIAGNOSIS — I110 Hypertensive heart disease with heart failure: Secondary | ICD-10-CM | POA: Diagnosis not present

## 2023-11-29 MED ORDER — GEMTESA 75 MG PO TABS: 75.0000 mg | ORAL_TABLET | Freq: Every day | ORAL | 11 refills | Status: DC

## 2023-11-29 MED ORDER — GEMTESA 75 MG PO TABS: 75.0000 mg | ORAL_TABLET | Freq: Every day | ORAL | 11 refills | Status: AC

## 2023-11-29 NOTE — Addendum Note (Signed)
 Addended by: GENITA HARLENE CROME on: 11/29/2023 10:04 AM   Modules accepted: Orders

## 2023-11-29 NOTE — Addendum Note (Signed)
 Addended byBETHA CORIE PLATER on: 11/29/2023 09:35 AM   Modules accepted: Orders

## 2023-11-30 ENCOUNTER — Telehealth: Payer: Self-pay

## 2023-12-04 ENCOUNTER — Ambulatory Visit: Admitting: Family Medicine

## 2023-12-04 ENCOUNTER — Encounter: Payer: Self-pay | Admitting: Family Medicine

## 2023-12-04 VITALS — BP 130/62 | HR 73 | Ht 65.0 in | Wt 206.4 lb

## 2023-12-04 DIAGNOSIS — I480 Paroxysmal atrial fibrillation: Secondary | ICD-10-CM

## 2023-12-04 DIAGNOSIS — N950 Postmenopausal bleeding: Secondary | ICD-10-CM

## 2023-12-04 DIAGNOSIS — E7800 Pure hypercholesterolemia, unspecified: Secondary | ICD-10-CM | POA: Diagnosis not present

## 2023-12-04 DIAGNOSIS — I10 Essential (primary) hypertension: Secondary | ICD-10-CM | POA: Diagnosis not present

## 2023-12-04 DIAGNOSIS — E039 Hypothyroidism, unspecified: Secondary | ICD-10-CM | POA: Diagnosis not present

## 2023-12-04 DIAGNOSIS — R7303 Prediabetes: Secondary | ICD-10-CM | POA: Diagnosis not present

## 2023-12-04 NOTE — Assessment & Plan Note (Signed)
 A1c back to normal on last check - continue to monitor

## 2023-12-04 NOTE — Assessment & Plan Note (Signed)
 Well controlled on last lipid panel Not currently on a statin

## 2023-12-04 NOTE — Assessment & Plan Note (Signed)
 Reviewed last TSH -wnl Well controlled Continue synthroid  at current dose

## 2023-12-04 NOTE — Progress Notes (Addendum)
 Established patient visit   Patient: Glenda Williams   DOB: Mar 23, 1940   83 y.o. Female  MRN: [id number] Visit Date: 12/04/2023  Today's healthcare provider: Jon Eva, MD   Chief Complaint  Patient presents with   Medical Management of Chronic Issues    She reports noticing some light red bleeding today and not sure if she has scratched herself or something else.    Hypertension   Hypothyroidism   Subjective    Hypertension   HPI     Medical Management of Chronic Issues    Additional comments: She reports noticing some light red bleeding today and not sure if she has scratched herself or something else.       Last edited by Lilian Fitzpatrick, CMA on 12/04/2023  2:43 PM.       Discussed the use of AI scribe software for clinical note transcription with the patient, who gave verbal consent to proceed.  History of Present Illness   Glenda Williams is an 83 year old female with atrial fibrillation who presents with vaginal bleeding.  She experiences light red vaginal bleeding that began this morning around 5 AM, described as light pink with two small clots. She uses pads to manage the bleeding. The bleeding started after lifting heavy boxes and straining during a bowel movement. She takes Xarelto  for atrial fibrillation. She has no bladder symptoms such as burning or urinary tract infection. Her blood pressure at home is stable, ranging between 120 and 130 mmHg. She has a history of a hematoma on her leg, which has decreased in size.        Medications: Outpatient Medications Prior to Visit  Medication Sig   albuterol  (VENTOLIN  HFA) 108 (90 Base) MCG/ACT inhaler Inhale 1-2 puffs into the lungs every 4 (four) hours as needed for wheezing or shortness of breath.   azelastine  (ASTELIN ) 0.1 % nasal spray Place 2 sprays into both nostrils 2 (two) times daily. Use in each nostril as directed   benzonatate  (TESSALON ) 100 MG capsule Take 1-2 capsules (100-200 mg total) by  mouth 3 (three) times daily as needed for cough.   cetirizine (ZYRTEC) 10 MG tablet Take 10 mg by mouth daily. (Patient taking differently: Take 10 mg by mouth daily as needed for allergies.)   cholecalciferol  (VITAMIN D ) 400 units TABS tablet Take 400 Units by mouth daily.   clobetasol (TEMOVATE) 0.05 % external solution APPLY TOPICALLY TWICE DAILY AS  NEEDED FOR ITCHING AND FLAKING  TO THE SCALP AND EARS. USE IT  UNTIL CLEAR OR FOR NO MORE THAN  2 WEEKS. RESTART AS NEEDED   conjugated estrogens  (PREMARIN ) vaginal cream Apply 0.5mg  (pea-sized amount)  just inside the vaginal introitus with a finger-tip on  Monday, Wednesday and Friday nights.   EPINEPHrine  0.3 mg/0.3 mL IJ SOAJ injection Inject 0.3 mLs into the skin once as needed for anaphylaxis.   fluvoxaMINE  (LUVOX ) 50 MG tablet Take 3 tablets (150 mg total) by mouth at bedtime.   folic acid  (FOLVITE ) 1 MG tablet Take 1 mg by mouth daily.   gabapentin  (NEURONTIN ) 100 MG capsule Take 1 capsule (100 mg total) by mouth 2 (two) times daily.   golimumab  (SIMPONI  ARIA) 50 MG/4ML SOLN injection Inject into the vein.   guaiFENesin  200 MG tablet Take 1 tablet (200 mg total) by mouth every 4 (four) hours as needed for cough or to loosen phlegm.   hydrochlorothiazide  (HYDRODIURIL ) 25 MG tablet Take 25 mg by mouth daily.  hydrocortisone  cream 1 % Apply 1 application  topically as needed for itching.   ketoconazole  (NIZORAL ) 2 % cream Apply 1 application topically daily.   ketoconazole  (NIZORAL ) 2 % shampoo Apply topically.   levothyroxine  (SYNTHROID ) 125 MCG tablet Take 1 tablet (125 mcg total) by mouth daily.   methotrexate  (RHEUMATREX) 2.5 MG tablet Take 20 mg by mouth every Thursday.   NYSTATIN  powder APPLY TOPICALLY TO SKIN FOLDS THREE TIMES DAILY   Omega-3 Fatty Acids (FISH OIL) 1000 MG CAPS Take 1 capsule by mouth daily.   omeprazole  (PRILOSEC) 20 MG capsule TAKE 1 CAPSULE BY MOUTH TWICE  DAILY BEFORE MEALS   potassium chloride  SA (K-DUR,KLOR-CON )  20 MEQ tablet Take 1 tablet by mouth  daily   propranolol  (INDERAL ) 40 MG tablet 80 mg in the am and 40 mg at night   Red Yeast Rice 600 MG CAPS Take 1 capsule by mouth daily.   rivaroxaban  (XARELTO ) 20 MG TABS tablet Take 1 tablet (20 mg total) by mouth daily with supper. Please report any side effects to R leg following starting including worsening of bruise, new/changed edema, color variation or temperature change.   torsemide (DEMADEX) 20 MG tablet Take 20 mg by mouth. Patient states she takes 1 tablet per day as needed   Vibegron  (GEMTESA ) 75 MG TABS Take 1 tablet (75 mg total) by mouth daily.   Vibegron  (GEMTESA ) 75 MG TABS Take 1 tablet (75 mg total) by mouth daily.   predniSONE  (DELTASONE ) 20 MG tablet 2 tabs poqday 1-3, 1 tabs poqday 4-6 (Patient not taking: Reported on 12/04/2023)   No facility-administered medications prior to visit.    Review of Systems     Objective    BP 130/62 Comment: home reading  Pulse 73   Ht 5' 5 (1.651 m)   Wt 206 lb 6.4 oz (93.6 kg)   SpO2 100%   BMI 34.35 kg/m    Physical Exam Vitals reviewed.  Constitutional:      General: She is not in acute distress.    Appearance: Normal appearance. She is well-developed. She is not diaphoretic.  HENT:     Head: Normocephalic and atraumatic.  Eyes:     General: No scleral icterus.    Conjunctiva/sclera: Conjunctivae normal.  Neck:     Thyroid : No thyromegaly.  Cardiovascular:     Rate and Rhythm: Normal rate and regular rhythm.     Heart sounds: Normal heart sounds. No murmur heard. Pulmonary:     Effort: Pulmonary effort is normal. No respiratory distress.     Breath sounds: Normal breath sounds. No wheezing, rhonchi or rales.  Genitourinary:    Comments: GYN:  External genitalia within normal limits.  Vaginal mucosa pink, bleeding noted externally and internally coming from external os. Blood clots present (size of quarter to half dollar.  Nonfriable cervix without lesions    Musculoskeletal:      Cervical back: Neck supple.     Right lower leg: No edema.     Left lower leg: No edema.  Lymphadenopathy:     Cervical: No cervical adenopathy.  Skin:    General: Skin is warm and dry.     Findings: No rash.  Neurological:     Mental Status: She is alert and oriented to person, place, and time. Mental status is at baseline.  Psychiatric:        Mood and Affect: Mood normal.        Behavior: Behavior normal.  Chaperone present  No results found for any visits on 12/04/23.  Assessment & Plan     Problem List Items Addressed This Visit       Cardiovascular and Mediastinum   Atrial fibrillation (HCC)   Hypertension - Primary     Endocrine   Adult hypothyroidism   Reviewed last TSH -wnl Well controlled Continue synthroid  at current dose        Other   Prediabetes   A1c back to normal on last check - continue to monitor      Hypercholesteremia   Well controlled on last lipid panel Not currently on a statin      Other Visit Diagnoses       Postmenopausal bleeding       Relevant Orders   US  Pelvic Complete With Transvaginal   Ambulatory referral to Gynecology          Postmenopausal vaginal bleeding Acute postmenopausal vaginal bleeding with clots, likely exacerbated by Xarelto . Bleeding is heavy, resembling a menstrual period, with clots observed during examination. Differential includes uterine pathology, necessitating further evaluation. The bleeding is suspected to be from the uterus, not a cut or external source. - Stop Xarelto  immediately - Order pelvic ultrasound to evaluate uterine pathology - Refer to gynecology, request female provider at [facility] - Instruct to monitor for heavy bleeding, such as soaking through a pad an hour, and go to the hospital if this occurs - Advise to go to the hospital if experiencing lightheadedness or weakness  Atrial fibrillation, paroxysmal Atrial fibrillation managed with Xarelto , which is  currently stopped due to bleeding risk. In NSR today.  Hypertension Blood pressure slightly elevated today but generally well-controlled at home with readings between 120-130 mmHg.  Overactive bladder Overactive bladder managed with Gemtesa .       Return in about 3 months (around 03/04/2024) for chronic disease f/u.      I personally spent a total of 45 minutes in the care of the patient today including preparing to see the patient, getting/reviewing separately obtained history, performing a medically appropriate exam/evaluation, counseling and educating, placing orders, referring and communicating with other health care professionals, documenting clinical information in the EHR, and coordinating care.   Jon Eva, MD  [facility], [facility] [phone] (phone) [phone] (fax)  [facility]

## 2023-12-07 ENCOUNTER — Telehealth: Payer: Self-pay

## 2023-12-07 NOTE — Telephone Encounter (Signed)
 Copied from CRM [id number]. Topic: Clinical - Medication Question >> Dec 06, 2023 12:29 PM Glenda Williams wrote: Reason for CRM: patient has stopped bleeding completely and has an appointment to get an ultrasound on 12/11/23 And is the provider any additional question please just call   Pt num [phone] (H)

## 2023-12-09 ENCOUNTER — Telehealth: Payer: Self-pay

## 2023-12-09 NOTE — Telephone Encounter (Signed)
 FYI   Copied from CRM [id number]. Topic: General - Other >> Dec 07, 2023  3:59 PM Santiya F wrote: Reason for CRM: Curlee Hussar with [facility] is calling in to report a missed visit.

## 2023-12-10 NOTE — Telephone Encounter (Signed)
 Noted

## 2023-12-10 NOTE — Telephone Encounter (Signed)
 Great news. Should still get US  and see GYN

## 2023-12-11 ENCOUNTER — Ambulatory Visit
Admission: RE | Admit: 2023-12-11 | Discharge: 2023-12-11 | Disposition: A | Discharge status: Home / Self Care | Source: Ambulatory Visit | Attending: Family Medicine | Admitting: Family Medicine

## 2023-12-11 DIAGNOSIS — N950 Postmenopausal bleeding: Secondary | ICD-10-CM | POA: Insufficient documentation

## 2023-12-11 DIAGNOSIS — N939 Abnormal uterine and vaginal bleeding, unspecified: Secondary | ICD-10-CM | POA: Diagnosis not present

## 2023-12-11 NOTE — Telephone Encounter (Signed)
Pt has arrived to appointment.

## 2023-12-13 ENCOUNTER — Telehealth: Payer: Self-pay

## 2023-12-13 ENCOUNTER — Other Ambulatory Visit: Payer: Self-pay

## 2023-12-13 NOTE — Telephone Encounter (Signed)
 Copied from CRM #[id number]. Topic: General - Other >> Dec 13, 2023  1:02 PM Joesph B wrote: Reason for CRM: patient is calling to receive a phone call from her provider or nurse.

## 2023-12-13 NOTE — Patient Outreach (Signed)
 Complex Care Management   Visit Note  12/13/2023  Name:  GEMMA RUAN MRN: [id number] DOB: 1940-09-14  Situation: Referral received for Complex Care Management related to Heart Failure and RA, Falls I obtained verbal consent from Patient.  Visit completed with Patient  on the phone  Background:   Past Medical History:  Diagnosis Date   A-fib [facility])    Arthritis    Bell palsy 09/04/2014   Congestion of nasal sinus 02/14/2023   Cough with sputum 02/14/2023   GERD (gastroesophageal reflux disease)    Heart disease    Rheumatoid arthritis (HCC)    Rhinosinusitis 02/14/2023   Thyroid  disease    Tremors of nervous system     Assessment: Patient Reported Symptoms:  Cognitive Cognitive Status: No symptoms reported Cognitive/Intellectual Conditions Management [RPT]: None reported or documented in medical history or problem list   Health Maintenance Behaviors: Annual physical exam, Healthy diet Healing Pattern: Average Health Facilitated by: Rest, Healthy diet  Neurological Neurological Review of Symptoms: Weakness, Dizziness Neurological Management Strategies: Routine screening Neurological Comment: reports once in a while weakness or dizziness, has not been recently  HEENT HEENT Symptoms Reported: Not assessed HEENT Comment: nasal sprays and allergy med    Cardiovascular Cardiovascular Symptoms Reported: No symptoms reported Does patient have uncontrolled Hypertension?: No Patient's Recent BP reading at home: no recent need for Torsemide  - denies HF sx, reviewed HF AP and when to call provider/red flags for ED Cardiovascular Management Strategies: Routine screening, Medication therapy Weight: 215 lb 9.6 oz (97.8 kg) Cardiovascular Comment: reports weight taken by PT before appointment - prior weight of 206, patient reports not correct, weight steady at approx 215 lb, BP 120-127/62-64, highest 140 SBP  Respiratory Respiratory Symptoms Reported: No symptoms reported Respiratory  Management Strategies: Routine screening, Medication therapy  Endocrine Is patient diabetic?: No    Gastrointestinal Gastrointestinal Symptoms Reported: No symptoms reported Additional Gastrointestinal Details: reports BM daily due to increased hydration Gastrointestinal Management Strategies: Incontinence garment/pad Gastrointestinal Comment: reports appetite generally good - food preference changes with weather changes per patient    Genitourinary Genitourinary Symptoms Reported: Incontinence Additional Genitourinary Details: Ethelene helping with control reports vaginal bleeding stopped a few days after Xarelto  stopped, completed US , will call PCP to see about when to resume Xarelto  and will call cardiology Genitourinary Management Strategies: Incontinence garment/pad, Medication therapy  Integumentary Integumentary Symptoms Reported: No symptoms reported Skin Management Strategies: Routine screening  Musculoskeletal Musculoskelatal Symptoms Reviewed: Back pain, Difficulty walking, Limited mobility, Unsteady gait Additional Musculoskeletal Details: continues with PT, no falls since last visit, using walker/cane Musculoskeletal Management Strategies: Routine screening, Exercise Falls in the past year?: Yes Number of falls in past year: 2 or more Was there an injury with Fall?: Yes Fall Risk Category Calculator: 3 Patient Fall Risk Level: High Fall Risk Patient at Risk for Falls Due to: History of fall(s), Impaired balance/gait, Impaired mobility, Orthopedic patient Fall risk Follow up: Falls evaluation completed, Falls prevention discussed  Psychosocial Psychosocial Symptoms Reported: No symptoms reported          12/13/2023    PHQ2-9 Depression Screening   Little interest or pleasure in doing things Not at all  Feeling down, depressed, or hopeless Not at all  PHQ-2 - Total Score 0  Trouble falling or staying asleep, or sleeping too much    Feeling tired or having little energy     Poor appetite or overeating     Feeling bad about yourself - or that you are  a failure or have let yourself or your family down    Trouble concentrating on things, such as reading the newspaper or watching television    Moving or speaking so slowly that other people could have noticed.  Or the opposite - being so fidgety or restless that you have been moving around a lot more than usual    Thoughts that you would be better off dead, or hurting yourself in some way    PHQ2-9 Total Score    If you checked off any problems, how difficult have these problems made it for you to do your work, take care of things at home, or get along with other people    Depression Interventions/Treatment      There were no vitals filed for this visit.  Medications Reviewed Today     Reviewed by Devra Lands, RN (Registered Nurse) on 12/13/23 at 1040  Med List Status: <None>   Medication Order Taking? Sig Documenting Provider Last Dose Status Informant  albuterol  (VENTOLIN  HFA) 108 (90 Base) MCG/ACT inhaler [id number] Yes Inhale 1-2 puffs into the lungs every 4 (four) hours as needed for wheezing or shortness of breath. Tapia, Leisa, PA-C  Active   azelastine  (ASTELIN ) 0.1 % nasal spray [id number] Yes Place 2 sprays into both nostrils 2 (two) times daily. Use in each nostril as directed Wellington Curtis LABOR, FNP  Active   benzonatate  (TESSALON ) 100 MG capsule [id number]  Take 1-2 capsules (100-200 mg total) by mouth 3 (three) times daily as needed for cough.  Patient not taking: Reported on 12/13/2023   Tapia, Leisa, PA-C  Active   cetirizine (ZYRTEC) 10 MG tablet [id number] Yes Take 10 mg by mouth daily.  Patient taking differently: Take 10 mg by mouth daily as needed for allergies.   [provider]  Active Self, Child, Pharmacy Records           Med Note [name], [name]   Wed May 30, 2023  4:16 PM) Patient states taking every day  cholecalciferol  (VITAMIN D ) 400 units TABS tablet [id number] Yes Take  400 Units by mouth daily. [provider]  Active Self, Child, Pharmacy Records  clobetasol (TEMOVATE) 0.05 % external solution [id number] Yes APPLY TOPICALLY TWICE DAILY AS  NEEDED FOR ITCHING AND FLAKING  TO THE SCALP AND EARS. USE IT  UNTIL CLEAR OR FOR NO MORE THAN  2 Asiah Browder. RESTART AS NEEDED [provider]  Active   conjugated estrogens  (PREMARIN ) vaginal cream [id number] Yes Apply 0.5mg  (pea-sized amount)  just inside the vaginal introitus with a finger-tip on  Monday, Wednesday and Friday nights. Helon Clotilda LABOR, PA-C  Active Self, Child, Pharmacy Records  EPINEPHrine  0.3 mg/0.3 mL IJ SOAJ injection [id number] Yes Inject 0.3 mLs into the skin once as needed for anaphylaxis. [provider]  Active Self, Child, Pharmacy Records  fluvoxaMINE  (LUVOX ) 50 MG tablet [id number] Yes Take 3 tablets (150 mg total) by mouth at bedtime. Myrla Jon HERO, MD  Active   folic acid  (FOLVITE ) 1 MG tablet [id number] Yes Take 1 mg by mouth daily. [provider]  Active Self, Child, Pharmacy Records  gabapentin  (NEURONTIN ) 100 MG capsule [id number] Yes Take 1 capsule (100 mg total) by mouth 2 (two) times daily. Bacigalupo, Angela M, MD  Active   golimumab  (SIMPONI  ARIA) 50 MG/4ML SOLN injection [id number] Yes Inject into the vein. [provider]  Active Self  guaiFENesin  200 MG tablet [id number]  Take 1 tablet (200 mg total) by mouth every 4 (  four) hours as needed for cough or to loosen phlegm.  Patient not taking: Reported on 12/13/2023   Clifton, Kellie A, FNP  Active   hydrochlorothiazide  (HYDRODIURIL ) 25 MG tablet [id number] Yes Take 25 mg by mouth daily. [provider]  Active Self, Child, Pharmacy Records  hydrocortisone  cream 1 % [id number] Yes Apply 1 application  topically as needed for itching. [provider]  Active Self, Child, Pharmacy Records  ketoconazole  (NIZORAL ) 2 % cream [id number] Yes Apply 1 application topically daily. Myrla Jon HERO, MD  Active Self, Child, Pharmacy Records           Med Note [name], [facility] E   Tue Jul 31, 2023  1:57 PM) Patient states uses as needed.   ketoconazole  (NIZORAL ) 2 % shampoo [id number] Yes Apply topically. [provider]  Active   levothyroxine  (SYNTHROID ) 125 MCG tablet [id number] Yes Take 1 tablet (125 mcg total) by mouth daily. Myrla Jon HERO, MD  Active   methotrexate  [facility]) 2.5 MG tablet [id number] Yes Take 20 mg by mouth every Thursday. [provider]  Active Self, Child, Pharmacy Records           Med Note WORLEY LUCIANO GORMAN Austin Nov 04, 2017 12:52 PM)    NYSTATIN  powder [id number] Yes APPLY TOPICALLY TO SKIN FOLDS THREE TIMES DAILY Emilio Kelly DASEN, FNP  Active   Omega-3 Fatty Acids (FISH OIL) 1000 MG CAPS [id number] Yes Take 1 capsule by mouth daily. [provider]  Active Self, Child, Pharmacy Records  omeprazole  (PRILOSEC) 20 MG capsule [id number]  TAKE 1 CAPSULE BY MOUTH TWICE  DAILY BEFORE MEALS  Patient not taking: Reported on 12/13/2023   Bacigalupo, Angela M, MD  Active   potassium chloride  SA (K-DUR,KLOR-CON ) 20 MEQ tablet [id number] Yes Take 1 tablet by mouth  daily Agapito Mom, MD  Active Self, Child, Pharmacy Records  predniSONE  (DELTASONE ) 20 MG tablet [id number]  2 tabs poqday 1-3, 1 tabs poqday 4-6  Patient not taking: Reported on 12/04/2023   Tapia, Leisa, PA-C  Active   propranolol  (INDERAL ) 40 MG tablet [id number] Yes 80 mg in the am and 40 mg at night [provider]  Active Self, Child, Pharmacy Records  Red Yeast Rice 600 MG CAPS [id number] Yes Take 1 capsule by mouth daily. [provider]  Active Self, Child, Pharmacy Records  rivaroxaban  (XARELTO ) 20 MG TABS tablet [id number]  Take 1 tablet (20 mg total) by mouth daily with supper. Please report any side effects to R leg following starting including worsening of bruise, new/changed edema, color variation or temperature change.  Patient not taking: Reported  on 12/13/2023   Emilio Kelly DASEN, FNP  Active   torsemide (DEMADEX) 20 MG tablet [id number] Yes Take 20 mg by mouth. Patient states she takes 1 tablet per day as needed [provider]  Active Self  Vibegron  (GEMTESA ) 75 MG TABS [id number] Yes Take 1 tablet (75 mg total) by mouth daily. Gaston Hamilton, MD  Active   Vibegron  (GEMTESA ) 75 MG TABS [id number]  Take 1 tablet (75 mg total) by mouth daily. Gaston Hamilton, MD  Active             Recommendation:   PCP Follow-up Continue Current Plan of Care  Follow Up Plan:   Telephone follow-up in 1 month  Nestora Duos, MSN, RN [facility]  [facility], [facility] RN Care Manager Direct Dial: [phone] Fax: [phone]

## 2023-12-13 NOTE — Telephone Encounter (Signed)
 Pt reports she was told we would contact her once her imaging was completed. I verbalized understanding and advised her imaging has not been read just yet but as soon as we receive results we will give her a call. She verbalized understanding

## 2023-12-13 NOTE — Patient Instructions (Addendum)
 Visit Information  Thank you for taking time to visit with me today. Please don't hesitate to contact me if I can be of assistance to you before our next scheduled appointment.  Your next care management appointment is by telephone on 01/10/2024 at 10:00  Telephone follow-up in 1 month  Please call the care guide team at [phone] if you need to cancel, schedule, or reschedule an appointment.   Please call the Suicide and Crisis Lifeline: 988 call the [place]  National Suicide Prevention Lifeline: 732 189 8091 or TTY: 517-055-8963 TTY 361-298-6014) to talk to a trained counselor call 1-800-273-TALK (toll free, 24 hour hotline) go to [facility] [address] [place] [phone]) call 911 if you are experiencing a Mental Health or Behavioral Health Crisis or need someone to talk to.  Nestora Duos, MSN, RN [place]  [facility], [facility] RN Care Manager Direct Dial: [phone] Fax: [phone]    Abnormal Uterine Bleeding Abnormal uterine bleeding means bleeding more than normal from your womb (uterus). It can include: Bleeding after sex. Bleeding between monthly (menstrual) periods. Bleeding that is heavier than normal. Monthly periods that last longer than normal. Bleeding after you have stopped having your monthly period (menopause). You should see a doctor for any kind of bleeding that is not normal. Treatment depends on the cause of your bleeding and how much you bleed. Follow these instructions at home: Medicines Take over-the-counter and prescription medicines only as told by your doctor. Ask your doctor about: Taking medicines such as aspirin and ibuprofen. Do not take these medicines unless your doctor tells you to take them. Taking over-the-counter medicines, vitamins, herbs, and supplements. You may be given iron  pills. Take them as told by your doctor. Managing constipation If you take iron   pills, you may need to take these actions to prevent or treat trouble pooping (constipation): Drink enough fluid to keep your pee (urine) pale yellow. Take over-the-counter or prescription medicines. Eat foods that are high in fiber. These include beans, whole grains, and fresh fruits and vegetables. Limit foods that are high in fat and sugar. These include fried or sweet foods. Activity Change your activity to decrease bleeding if you need to change your sanitary pad more than one time every 2 hours: Lie in bed with your feet raised (elevated). Place a cold pack on your lower belly. Rest as much as you are able until the bleeding stops or slows down. General instructions Do not use tampons, douche, or have sex until your doctor says these things are okay. Change your pads often. Get regular exams. These include: Pelvic exams. Screenings for cancer of the cervix. It is up to you to get the results of any tests that are done. Ask how to get your results when they are ready. Watch for any changes in your bleeding. For 2 months, write down: When your monthly period starts. When your monthly period ends. When you get any abnormal bleeding from your vagina. What problems you notice. Keep all follow-up visits. Contact a doctor if: The bleeding lasts more than one week. You feel dizzy at times. You feel like you may vomit (nausea). You vomit. You feel light-headed or weak. Your symptoms get worse. Get help right away if: You faint. You have to change pads every hour. You have pain in your belly. You have a fever or chills. You get sweaty or weak. You pass large blood clots from your vagina. These symptoms may be an emergency. Get help  right away. Call your local emergency services (911 in the [place] Do not wait to see if the symptoms will go away. Do not drive yourself to the hospital. Summary Abnormal uterine bleeding means bleeding more than normal from your womb (uterus). Any  kind of bleeding that is not normal should be checked by a doctor. Treatment depends on the cause of your bleeding and how much you bleed. Get help right away if you faint, you have to change pads every hour, or you pass large blood clots from your vagina. This information is not intended to replace advice given to you by your health care provider. Make sure you discuss any questions you have with your health care provider. Document Revised: 06/22/2020 Document Reviewed: 06/22/2020 Elsevier Patient Education  2024 [employer].

## 2023-12-17 ENCOUNTER — Ambulatory Visit: Payer: Self-pay | Admitting: Family Medicine

## 2023-12-17 DIAGNOSIS — Z7409 Other reduced mobility: Secondary | ICD-10-CM | POA: Diagnosis not present

## 2023-12-17 DIAGNOSIS — M0579 Rheumatoid arthritis with rheumatoid factor of multiple sites without organ or systems involvement: Secondary | ICD-10-CM | POA: Diagnosis not present

## 2023-12-17 DIAGNOSIS — I5032 Chronic diastolic (congestive) heart failure: Secondary | ICD-10-CM | POA: Diagnosis not present

## 2023-12-17 DIAGNOSIS — F418 Other specified anxiety disorders: Secondary | ICD-10-CM | POA: Diagnosis not present

## 2023-12-17 DIAGNOSIS — K219 Gastro-esophageal reflux disease without esophagitis: Secondary | ICD-10-CM | POA: Diagnosis not present

## 2023-12-17 DIAGNOSIS — I110 Hypertensive heart disease with heart failure: Secondary | ICD-10-CM | POA: Diagnosis not present

## 2023-12-17 DIAGNOSIS — N3946 Mixed incontinence: Secondary | ICD-10-CM | POA: Diagnosis not present

## 2023-12-17 DIAGNOSIS — Z6834 Body mass index (BMI) 34.0-34.9, adult: Secondary | ICD-10-CM | POA: Diagnosis not present

## 2023-12-17 DIAGNOSIS — F429 Obsessive-compulsive disorder, unspecified: Secondary | ICD-10-CM | POA: Diagnosis not present

## 2023-12-17 DIAGNOSIS — M1990 Unspecified osteoarthritis, unspecified site: Secondary | ICD-10-CM | POA: Diagnosis not present

## 2023-12-17 DIAGNOSIS — G250 Essential tremor: Secondary | ICD-10-CM | POA: Diagnosis not present

## 2023-12-17 DIAGNOSIS — E669 Obesity, unspecified: Secondary | ICD-10-CM | POA: Diagnosis not present

## 2023-12-17 DIAGNOSIS — G629 Polyneuropathy, unspecified: Secondary | ICD-10-CM | POA: Diagnosis not present

## 2023-12-17 DIAGNOSIS — E559 Vitamin D deficiency, unspecified: Secondary | ICD-10-CM | POA: Diagnosis not present

## 2023-12-17 DIAGNOSIS — I4891 Unspecified atrial fibrillation: Secondary | ICD-10-CM | POA: Diagnosis not present

## 2023-12-17 DIAGNOSIS — Z7901 Long term (current) use of anticoagulants: Secondary | ICD-10-CM | POA: Diagnosis not present

## 2023-12-17 DIAGNOSIS — Z9181 History of falling: Secondary | ICD-10-CM | POA: Diagnosis not present

## 2023-12-17 DIAGNOSIS — M792 Neuralgia and neuritis, unspecified: Secondary | ICD-10-CM | POA: Diagnosis not present

## 2023-12-18 ENCOUNTER — Telehealth: Payer: Self-pay

## 2023-12-18 NOTE — Telephone Encounter (Signed)
 Copied from CRM [id number]. Topic: General - Other >> Dec 18, 2023 11:13 AM Cleave MATSU wrote: Reason for CRM: pt wanted to know if she should move her gynecology appt up faster that's on dec 3rd please call pt back to assist.

## 2023-12-18 NOTE — Telephone Encounter (Signed)
 I think it will be ok to wait since bleeding has stopped, but she can call their office and asked to be put on the cancellation list if something sooner opens up

## 2023-12-19 DIAGNOSIS — I110 Hypertensive heart disease with heart failure: Secondary | ICD-10-CM | POA: Diagnosis not present

## 2023-12-19 DIAGNOSIS — I5032 Chronic diastolic (congestive) heart failure: Secondary | ICD-10-CM | POA: Diagnosis not present

## 2023-12-19 DIAGNOSIS — N3946 Mixed incontinence: Secondary | ICD-10-CM | POA: Diagnosis not present

## 2023-12-19 DIAGNOSIS — M0579 Rheumatoid arthritis with rheumatoid factor of multiple sites without organ or systems involvement: Secondary | ICD-10-CM | POA: Diagnosis not present

## 2023-12-19 DIAGNOSIS — M792 Neuralgia and neuritis, unspecified: Secondary | ICD-10-CM | POA: Diagnosis not present

## 2023-12-19 DIAGNOSIS — G629 Polyneuropathy, unspecified: Secondary | ICD-10-CM | POA: Diagnosis not present

## 2023-12-19 MED ORDER — GEMTESA 75 MG PO TABS
75.0000 mg | ORAL_TABLET | Freq: Every day | ORAL | 11 refills | Status: AC
Start: 2023-12-19 — End: 2024-12-13

## 2023-12-19 NOTE — Telephone Encounter (Signed)
 Pt advised. Verbalized understanding. I also provided her imaging results. She verbalized understanding

## 2023-12-19 NOTE — Addendum Note (Signed)
 Addended byBETHA CORIE PLATER on: 12/19/2023 08:42 AM   Modules accepted: Orders

## 2023-12-20 NOTE — Telephone Encounter (Unsigned)
 Copied from CRM #[id number]. Topic: Clinical - Medical Advice >> Dec 19, 2023 10:29 AM Treva T wrote: Reason for CRM: Patient calling, inquiring on previous message sent to provider, requesting a return call in regards to provider response, and to also ask questions regarding imaging test results, as she states she does not understand those results.  Can be reached to discuss further at [phone], if no answer, can be reached at [phone].  Patient is aware of same day call back.

## 2023-12-20 NOTE — Telephone Encounter (Signed)
 Spoke with patient at 1:49 pm on 12/19/23 and addressed all concerns.

## 2023-12-20 NOTE — Telephone Encounter (Unsigned)
 Copied from CRM [id number]. Topic: General - Other >> Dec 19, 2023 11:49 AM Lonell PEDLAR wrote: Reason for CRM: Advised patient she can contact her GYN office for a sooner apt, she would like to speak to provider/nurse to seek other options.

## 2023-12-24 DIAGNOSIS — Z6831 Body mass index (BMI) 31.0-31.9, adult: Secondary | ICD-10-CM | POA: Diagnosis not present

## 2023-12-24 DIAGNOSIS — R600 Localized edema: Secondary | ICD-10-CM | POA: Diagnosis not present

## 2023-12-24 DIAGNOSIS — E66811 Obesity, class 1: Secondary | ICD-10-CM | POA: Diagnosis not present

## 2023-12-24 DIAGNOSIS — I10 Essential (primary) hypertension: Secondary | ICD-10-CM | POA: Diagnosis not present

## 2023-12-24 DIAGNOSIS — M0579 Rheumatoid arthritis with rheumatoid factor of multiple sites without organ or systems involvement: Secondary | ICD-10-CM | POA: Diagnosis not present

## 2023-12-24 DIAGNOSIS — I480 Paroxysmal atrial fibrillation: Secondary | ICD-10-CM | POA: Diagnosis not present

## 2023-12-24 DIAGNOSIS — K219 Gastro-esophageal reflux disease without esophagitis: Secondary | ICD-10-CM | POA: Diagnosis not present

## 2023-12-25 ENCOUNTER — Telehealth: Payer: Self-pay

## 2023-12-25 NOTE — Progress Notes (Signed)
 Complex Care Management Note  Care Guide Note 12/25/2023 Name: Glenda Williams MRN: [id number] DOB: Jun 05, 1940  Glenda Williams is a 83 y.o. year old female who sees Bacigalupo, Jon HERO, MD for primary care. I reached out to Glenda Williams by phone today to offer complex care management services.  [name] was given information about Complex Care Management services today including:   The Complex Care Management services include support from the care team which includes your Nurse Care Manager, Clinical Social Worker, or Pharmacist.  The Complex Care Management team is here to help remove barriers to the health concerns and goals most important to you. Complex Care Management services are voluntary, and the patient may decline or stop services at any time by request to their care team member.   Complex Care Management Consent Status: Patient wishes to consider information provided and/or speak with a member of the care team before deciding to participate in complex care management services.   Follow up plan:  Patient will follow up with VBCI CCM RN.  Encounter Outcome:  Patient Refused  [place]reama Agent [facility], [facility] Assistant Direct Dial: [phone]  Fax: [phone]

## 2023-12-26 DIAGNOSIS — I5032 Chronic diastolic (congestive) heart failure: Secondary | ICD-10-CM | POA: Diagnosis not present

## 2023-12-26 DIAGNOSIS — M792 Neuralgia and neuritis, unspecified: Secondary | ICD-10-CM | POA: Diagnosis not present

## 2023-12-26 DIAGNOSIS — N3946 Mixed incontinence: Secondary | ICD-10-CM | POA: Diagnosis not present

## 2023-12-26 DIAGNOSIS — I110 Hypertensive heart disease with heart failure: Secondary | ICD-10-CM | POA: Diagnosis not present

## 2023-12-26 DIAGNOSIS — M0579 Rheumatoid arthritis with rheumatoid factor of multiple sites without organ or systems involvement: Secondary | ICD-10-CM | POA: Diagnosis not present

## 2023-12-26 DIAGNOSIS — G629 Polyneuropathy, unspecified: Secondary | ICD-10-CM | POA: Diagnosis not present

## 2024-01-01 DIAGNOSIS — M0579 Rheumatoid arthritis with rheumatoid factor of multiple sites without organ or systems involvement: Secondary | ICD-10-CM | POA: Diagnosis not present

## 2024-01-02 DIAGNOSIS — G629 Polyneuropathy, unspecified: Secondary | ICD-10-CM | POA: Diagnosis not present

## 2024-01-02 DIAGNOSIS — M792 Neuralgia and neuritis, unspecified: Secondary | ICD-10-CM | POA: Diagnosis not present

## 2024-01-02 DIAGNOSIS — N3946 Mixed incontinence: Secondary | ICD-10-CM | POA: Diagnosis not present

## 2024-01-02 DIAGNOSIS — I5032 Chronic diastolic (congestive) heart failure: Secondary | ICD-10-CM | POA: Diagnosis not present

## 2024-01-02 DIAGNOSIS — I110 Hypertensive heart disease with heart failure: Secondary | ICD-10-CM | POA: Diagnosis not present

## 2024-01-02 DIAGNOSIS — M0579 Rheumatoid arthritis with rheumatoid factor of multiple sites without organ or systems involvement: Secondary | ICD-10-CM | POA: Diagnosis not present

## 2024-01-05 ENCOUNTER — Other Ambulatory Visit: Payer: Self-pay | Admitting: Family Medicine

## 2024-01-07 NOTE — Telephone Encounter (Signed)
 Requested Prescriptions  Pending Prescriptions Disp Refills   fluvoxaMINE  (LUVOX ) 50 MG tablet [Pharmacy Med Name: FLUVOXAMINE  50MG  TABLETS] 270 tablet 0    Sig: TAKE 3 TABLETS BY MOUTH EVERY NIGHT AT BEDTIME     Psychiatry:  Antidepressants - SSRI Passed - 01/07/2024  3:58 PM      Passed - Completed PHQ-2 or PHQ-9 in the last 360 days      Passed - Valid encounter within last 6 months    Recent Outpatient Visits           1 month ago Primary hypertension   [facility] [place], Jon HERO, MD   4 months ago [facility], Jon HERO, MD   5 months ago Primary hypertension   [facility] [place], Jon HERO, MD   6 months ago Laceration of forehead, subsequent encounter   [facility] [place], Jon HERO, MD   7 months ago Poor mobility   [facility] [place], Jon HERO, MD

## 2024-01-10 ENCOUNTER — Telehealth: Payer: Self-pay

## 2024-01-10 DIAGNOSIS — I5032 Chronic diastolic (congestive) heart failure: Secondary | ICD-10-CM | POA: Diagnosis not present

## 2024-01-10 DIAGNOSIS — M792 Neuralgia and neuritis, unspecified: Secondary | ICD-10-CM | POA: Diagnosis not present

## 2024-01-10 DIAGNOSIS — N3946 Mixed incontinence: Secondary | ICD-10-CM | POA: Diagnosis not present

## 2024-01-10 DIAGNOSIS — I110 Hypertensive heart disease with heart failure: Secondary | ICD-10-CM | POA: Diagnosis not present

## 2024-01-10 DIAGNOSIS — M0579 Rheumatoid arthritis with rheumatoid factor of multiple sites without organ or systems involvement: Secondary | ICD-10-CM | POA: Diagnosis not present

## 2024-01-10 DIAGNOSIS — G629 Polyneuropathy, unspecified: Secondary | ICD-10-CM | POA: Diagnosis not present

## 2024-01-10 NOTE — Patient Instructions (Signed)
 Artemisa ONEIDA Leek - I am sorry I was unable to reach you today for our scheduled appointment. I work with Bacigalupo, Jon HERO, MD and am calling to support your healthcare needs. Please contact me at [phone] at your earliest convenience. I look forward to speaking with you soon.   Thank you,  Nestora Duos, MSN, RN [facility]  [facility], [facility] RN Care Manager Direct Dial: [phone] Fax: [phone]

## 2024-01-14 DIAGNOSIS — I110 Hypertensive heart disease with heart failure: Secondary | ICD-10-CM | POA: Diagnosis not present

## 2024-01-14 DIAGNOSIS — G629 Polyneuropathy, unspecified: Secondary | ICD-10-CM | POA: Diagnosis not present

## 2024-01-14 DIAGNOSIS — I5032 Chronic diastolic (congestive) heart failure: Secondary | ICD-10-CM | POA: Diagnosis not present

## 2024-01-14 DIAGNOSIS — M0579 Rheumatoid arthritis with rheumatoid factor of multiple sites without organ or systems involvement: Secondary | ICD-10-CM | POA: Diagnosis not present

## 2024-01-14 DIAGNOSIS — N3946 Mixed incontinence: Secondary | ICD-10-CM | POA: Diagnosis not present

## 2024-01-14 DIAGNOSIS — M792 Neuralgia and neuritis, unspecified: Secondary | ICD-10-CM | POA: Diagnosis not present

## 2024-01-23 ENCOUNTER — Encounter: Payer: Self-pay | Admitting: Family Medicine

## 2024-01-26 ENCOUNTER — Other Ambulatory Visit: Payer: Self-pay | Admitting: Family Medicine

## 2024-01-28 ENCOUNTER — Ambulatory Visit (INDEPENDENT_AMBULATORY_CARE_PROVIDER_SITE_OTHER): Admitting: Urology

## 2024-01-28 DIAGNOSIS — N3946 Mixed incontinence: Secondary | ICD-10-CM | POA: Diagnosis not present

## 2024-01-28 LAB — URINALYSIS, COMPLETE
Bilirubin, UA: NEGATIVE
Glucose, UA: NEGATIVE
Ketones, UA: NEGATIVE
Nitrite, UA: POSITIVE — AB
Protein,UA: NEGATIVE
Specific Gravity, UA: 1.02 (ref 1.005–1.030)
Urobilinogen, Ur: 0.2 mg/dL (ref 0.2–1.0)
pH, UA: 6 (ref 5.0–7.5)

## 2024-01-28 LAB — MICROSCOPIC EXAMINATION
Epithelial Cells (non renal): >10 /HPF — AB (ref 0–10)
WBC, UA: >30 /HPF — AB (ref 0–5)

## 2024-01-28 LAB — BLADDER SCAN AMB NON-IMAGING: Scan Result: 10

## 2024-01-28 NOTE — Progress Notes (Signed)
 01/28/2024 10:58 AM   Glenda Williams 07/13/1940 [id number]  Referring provider: Myrla Jon HERO, MD [address] [place],  [place] [address]  Chief Complaint  Patient presents with   Cysto    HPI: Penne: Urgency incontinence and urinary tract infections.  Estrogen cream expensive.  Positive cultures.  Normal cystoscopy and cystoscopy in 2022 for blood in the urine   Today Patient has urge incontinence and foot on the floor syndrome as soon as he gets to the toilet.  Sometimes leaks with coughing sneezing.  Wears 3-4 pads a day.  No bedwetting   Voids hourly and gets up at least 4 times at night   Has not had bladder infections in 3 years and uses estrogen cream once or twice a week but not every week    Patient will be treated as an overactive bladder.  I will hold off on urodynamics for now.  Gemtesa  samples and prescription and come back in check postvoid residual and perform pelvic examination cystoscopy.  Call if culture positive   She tried to get her son on the phone who is a retired emergency planning/management officer.  She asked about the pure wick but I think this could be a last choice and she does not have bedwetting  Today Patient says that the Gemtesa  is helping her go less often and having less urge incontinence.  She has intermittent burning.  Last urine culture was positive  On pelvic examination she had mild hypermobility bladder neck and negative cough test.  Moderate atrophy.  Mild cystocele.  No stress incontinence  Patient underwent flexible cystoscopy.  Bladder mucosa and trigone were normal.  Urine was quite cloudy with white flecks.  Urine was sent for culture.   PMH: Past Medical History:  Diagnosis Date   A-fib (HCC)    Arthritis    Bell palsy 09/04/2014   Congestion of nasal sinus 02/14/2023   Cough with sputum 02/14/2023   GERD (gastroesophageal reflux disease)    Heart disease    Rheumatoid arthritis (HCC)    Rhinosinusitis 02/14/2023    Thyroid  disease    Tremors of nervous system     Surgical History: Past Surgical History:  Procedure Laterality Date   BREAST BIOPSY Left 1988   Benign   BREAST BIOPSY Left 1987   Benign   BREAST EXCISIONAL BIOPSY     BROW LIFT Bilateral 05/23/2016   Procedure: BLEPHAROPLASTY upper eyelid; w/excess skin;  Surgeon: Greig HERO Gay, MD;  [facility];  Service: Ophthalmology;  Laterality: Bilateral;   CARPAL TUNNEL RELEASE Bilateral    CHOLECYSTECTOMY     Dr. Dessa   COLONOSCOPY  2006   EYE SURGERY     JOINT REPLACEMENT Bilateral    knees   REPLACEMENT TOTAL KNEE Left 2005   REPLACEMENT TOTAL KNEE Right 1998    Home Medications:  Allergies as of 01/28/2024       Reactions   Bactrim  [sulfamethoxazole -trimethoprim ] Anaphylaxis   Mirabegron  Rash   Patient reported it hospitalized her and caused bradycardia   Macrobid  [nitrofurantoin  Macrocrystal] Rash   Oxybutynin  Rash   Penicillins Rash   Has patient had a PCN reaction causing immediate rash, facial/tongue/throat swelling, SOB or lightheadedness with hypotension: Yes Has patient had a PCN reaction causing severe rash involving mucus membranes or skin necrosis: No Has patient had a PCN reaction that required hospitalization: No Has patient had a PCN reaction occurring within the last 10 years: Unknown If all of the  above answers are NO, then may proceed with Cephalosporin use.        Medication List        Accurate as of January 28, 2024 10:58 AM. If you have any questions, ask your nurse or doctor.          albuterol  108 (90 Base) MCG/ACT inhaler Commonly known as: VENTOLIN  HFA Inhale 1-2 puffs into the lungs every 4 (four) hours as needed for wheezing or shortness of breath.   azelastine  0.1 % nasal spray Commonly known as: ASTELIN  Place 2 sprays into both nostrils 2 (two) times daily. Use in each nostril as directed   benzonatate  100 MG capsule Commonly known as: TESSALON  Take 1-2  capsules (100-200 mg total) by mouth 3 (three) times daily as needed for cough.   cetirizine 10 MG tablet Commonly known as: ZYRTEC Take 10 mg by mouth daily. What changed:  when to take this reasons to take this   cholecalciferol  10 MCG (400 UNIT) Tabs tablet Commonly known as: VITAMIN D3 Take 400 Units by mouth daily.   clobetasol 0.05 % external solution Commonly known as: TEMOVATE APPLY TOPICALLY TWICE DAILY AS  NEEDED FOR ITCHING AND FLAKING  TO THE SCALP AND EARS. USE IT  UNTIL CLEAR OR FOR NO MORE THAN  2 WEEKS. RESTART AS NEEDED   EPINEPHrine  0.3 mg/0.3 mL Soaj injection Commonly known as: EPI-PEN Inject 0.3 mLs into the skin once as needed for anaphylaxis.   Fish Oil 1000 MG Caps Take 1 capsule by mouth daily.   fluvoxaMINE  50 MG tablet Commonly known as: LUVOX  TAKE 3 TABLETS BY MOUTH EVERY NIGHT AT BEDTIME   folic acid  1 MG tablet Commonly known as: FOLVITE  Take 1 mg by mouth daily.   gabapentin  100 MG capsule Commonly known as: NEURONTIN  Take 1 capsule (100 mg total) by mouth 2 (two) times daily.   Gemtesa  75 MG Tabs Generic drug: Vibegron  Take 1 tablet (75 mg total) by mouth daily.   Gemtesa  75 MG Tabs Generic drug: Vibegron  Take 1 tablet (75 mg total) by mouth daily.   guaiFENesin  200 MG tablet Take 1 tablet (200 mg total) by mouth every 4 (four) hours as needed for cough or to loosen phlegm.   hydrochlorothiazide  25 MG tablet Commonly known as: HYDRODIURIL  Take 25 mg by mouth daily.   hydrocortisone  cream 1 % Apply 1 application  topically as needed for itching.   ketoconazole  2 % cream Commonly known as: NIZORAL  Apply 1 application topically daily.   ketoconazole  2 % shampoo Commonly known as: NIZORAL  Apply topically.   levothyroxine  125 MCG tablet Commonly known as: SYNTHROID  TAKE 1 TABLET BY MOUTH DAILY   methotrexate  2.5 MG tablet Commonly known as: RHEUMATREX Take 20 mg by mouth every Thursday.   nystatin  powder Generic drug:  nystatin  APPLY TOPICALLY TO SKIN FOLDS THREE TIMES DAILY   omeprazole  20 MG capsule Commonly known as: PRILOSEC TAKE 1 CAPSULE BY MOUTH TWICE  DAILY BEFORE MEALS   potassium chloride  SA 20 MEQ tablet Commonly known as: KLOR-CON  M Take 1 tablet by mouth  daily   predniSONE  20 MG tablet Commonly known as: DELTASONE  2 tabs poqday 1-3, 1 tabs poqday 4-6   Premarin  vaginal cream Generic drug: conjugated estrogens  Apply 0.5mg  (pea-sized amount)  just inside the vaginal introitus with a finger-tip on  Monday, Wednesday and Friday nights.   propranolol  40 MG tablet Commonly known as: INDERAL  80 mg in the am and 40 mg at night   Red  Yeast Rice 600 MG Caps Take 1 capsule by mouth daily.   rivaroxaban  20 MG Tabs tablet Commonly known as: XARELTO  Take 1 tablet (20 mg total) by mouth daily with supper. Please report any side effects to R leg following starting including worsening of bruise, new/changed edema, color variation or temperature change.   Simponi  Aria 50 MG/4ML Soln injection Generic drug: golimumab  Inject into the vein.   torsemide 20 MG tablet Commonly known as: DEMADEX Take 20 mg by mouth. Patient states she takes 1 tablet per day as needed        Allergies:  Allergies  Allergen Reactions   Bactrim  [Sulfamethoxazole -Trimethoprim ] Anaphylaxis   Mirabegron  Rash    Patient reported it hospitalized her and caused bradycardia   Macrobid  [Nitrofurantoin  Macrocrystal] Rash   Oxybutynin  Rash   Penicillins Rash    Has patient had a PCN reaction causing immediate rash, facial/tongue/throat swelling, SOB or lightheadedness with hypotension: Yes Has patient had a PCN reaction causing severe rash involving mucus membranes or skin necrosis: No Has patient had a PCN reaction that required hospitalization: No Has patient had a PCN reaction occurring within the last 10 years: Unknown If all of the above answers are NO, then may proceed with Cephalosporin use.     Family  History: Family History  Problem Relation Age of Onset   Stroke Mother    Hypertension Mother    Heart disease Father    Arthritis Father    Parkinson's disease Sister    Cancer Sister    Parkinson's disease Brother    Bladder Cancer Brother    Breast cancer Neg Hx    Kidney cancer Neg Hx     Social History:  reports that she has never smoked. She has never used smokeless tobacco. She reports that she does not drink alcohol and does not use drugs.  ROS:                                        Physical Exam: There were no vitals taken for this visit.  Constitutional:  Alert and oriented, No acute distress.   Laboratory Data: Lab Results  Component Value Date   WBC 8.4 08/03/2023   HGB 10.2 (L) 08/03/2023   HCT 33.4 (L) 08/03/2023   MCV 87 08/03/2023   PLT 320 08/03/2023    Lab Results  Component Value Date   CREATININE 0.75 08/03/2023    No results found for: PSA  No results found for: TESTOSTERONE  Lab Results  Component Value Date   HGBA1C 5.6 08/03/2023    Urinalysis    Component Value Date/Time   COLORURINE YELLOW (A) 10/23/2021 1036   APPEARANCEUR Slightly cloudy 10/29/2023 1040   LABSPEC 1.026 10/23/2021 1036   PHURINE 5.0 10/23/2021 1036   GLUCOSEU Negative 10/29/2023 1040   HGBUR SMALL (A) 10/23/2021 1036   BILIRUBINUR Negative 10/29/2023 1040   KETONESUR NEGATIVE 10/23/2021 1036   PROTEINUR Negative 10/29/2023 1040   PROTEINUR NEGATIVE 10/23/2021 1036   UROBILINOGEN >=8.0 (A) 11/11/2020 1334   NITRITE Positive (A) 10/29/2023 1040   NITRITE NEGATIVE 10/23/2021 1036   LEUKOCYTESUR 2+ (A) 10/29/2023 1040   LEUKOCYTESUR TRACE (A) 10/23/2021 1036    Pertinent Imaging: Urine reviewed and sent for culture  Assessment & Plan: Patient has urge incontinence.  I think we need to have reasonable treatment goals.  Call if culture positive.  I did not  put her on prophylaxis yet.  Treated for positive.  Stay on Gemtesa .   Reassess in formal  1. Mixed incontinence (Primary)  - Urinalysis, Complete - BLADDER SCAN AMB NON-IMAGING   No follow-ups on file.  Glendia DELENA Elizabeth, MD  [facility] [address], [address] [place], [place] [address] [phone]

## 2024-01-29 ENCOUNTER — Ambulatory Visit (INDEPENDENT_AMBULATORY_CARE_PROVIDER_SITE_OTHER): Admitting: Vascular Surgery

## 2024-01-29 ENCOUNTER — Ambulatory Visit (INDEPENDENT_AMBULATORY_CARE_PROVIDER_SITE_OTHER): Payer: Self-pay

## 2024-01-29 ENCOUNTER — Encounter (INDEPENDENT_AMBULATORY_CARE_PROVIDER_SITE_OTHER): Payer: Self-pay | Admitting: Vascular Surgery

## 2024-01-29 VITALS — BP 142/88 | Ht 65.0 in | Wt 212.0 lb

## 2024-01-29 VITALS — BP 136/68 | HR 65 | Resp 18 | Wt 214.0 lb

## 2024-01-29 DIAGNOSIS — Z7409 Other reduced mobility: Secondary | ICD-10-CM

## 2024-01-29 DIAGNOSIS — M7989 Other specified soft tissue disorders: Secondary | ICD-10-CM | POA: Diagnosis not present

## 2024-01-29 DIAGNOSIS — M069 Rheumatoid arthritis, unspecified: Secondary | ICD-10-CM

## 2024-01-29 DIAGNOSIS — I890 Lymphedema, not elsewhere classified: Secondary | ICD-10-CM | POA: Diagnosis not present

## 2024-01-29 DIAGNOSIS — Z0000 Encounter for general adult medical examination without abnormal findings: Secondary | ICD-10-CM | POA: Diagnosis not present

## 2024-01-29 DIAGNOSIS — Z780 Asymptomatic menopausal state: Secondary | ICD-10-CM | POA: Diagnosis not present

## 2024-01-29 DIAGNOSIS — Z23 Encounter for immunization: Secondary | ICD-10-CM

## 2024-01-29 DIAGNOSIS — I10 Essential (primary) hypertension: Secondary | ICD-10-CM

## 2024-01-29 DIAGNOSIS — I5032 Chronic diastolic (congestive) heart failure: Secondary | ICD-10-CM | POA: Diagnosis not present

## 2024-01-29 DIAGNOSIS — I38 Endocarditis, valve unspecified: Secondary | ICD-10-CM | POA: Diagnosis not present

## 2024-01-29 NOTE — Assessment & Plan Note (Signed)
 Symptoms are better. Continue current measures. Plan follow up in one year.

## 2024-01-29 NOTE — Progress Notes (Signed)
 MRN : [id number]  Glenda Williams is a 83 y.o. (Oct 04, 1940) female who presents with chief complaint of  Chief Complaint  Patient presents with   Follow-up    4-6 month follow up  .  History of Present Illness: Patient returns today in follow up of her leg swelling and lymphedema.  She has been doing very well and drinking more water, elevating her legs, and did PT.  This has resulted in marked improvement in her leg swelling.  Both legs are actually doing quite well in terms with her swelling.  She still has some prominent varicosities and stasis dermatitis changes, but the swelling is definitely down.  Her legs feel better and she is pleased with how her legs are doing.  Current Outpatient Medications  Medication Sig Dispense Refill   albuterol  (VENTOLIN  HFA) 108 (90 Base) MCG/ACT inhaler Inhale 1-2 puffs into the lungs every 4 (four) hours as needed for wheezing or shortness of breath. 26.8 g 3   azelastine  (ASTELIN ) 0.1 % nasal spray Place 2 sprays into both nostrils 2 (two) times daily. Use in each nostril as directed 30 mL 12   cetirizine (ZYRTEC) 10 MG tablet Take 10 mg by mouth daily. (Patient taking differently: Take 10 mg by mouth daily as needed for allergies.)     cholecalciferol  (VITAMIN D ) 400 units TABS tablet Take 400 Units by mouth daily.     clobetasol (TEMOVATE) 0.05 % external solution APPLY TOPICALLY TWICE DAILY AS  NEEDED FOR ITCHING AND FLAKING  TO THE SCALP AND EARS. USE IT  UNTIL CLEAR OR FOR NO MORE THAN  2 WEEKS. RESTART AS NEEDED     conjugated estrogens  (PREMARIN ) vaginal cream Apply 0.5mg  (pea-sized amount)  just inside the vaginal introitus with a finger-tip on  Monday, Wednesday and Friday nights. 30 g 12   EPINEPHrine  0.3 mg/0.3 mL IJ SOAJ injection Inject 0.3 mLs into the skin once as needed for anaphylaxis.  1   fluvoxaMINE  (LUVOX ) 50 MG tablet TAKE 3 TABLETS BY MOUTH EVERY NIGHT AT BEDTIME 270 tablet 0   folic acid  (FOLVITE ) 1 MG tablet Take 1 mg by mouth  daily.     gabapentin  (NEURONTIN ) 100 MG capsule Take 1 capsule (100 mg total) by mouth 2 (two) times daily. 180 capsule 1   golimumab  (SIMPONI  ARIA) 50 MG/4ML SOLN injection Inject into the vein.     hydrochlorothiazide  (HYDRODIURIL ) 25 MG tablet Take 25 mg by mouth daily.     hydrocortisone  cream 1 % Apply 1 application  topically as needed for itching.     ketoconazole  (NIZORAL ) 2 % cream Apply 1 application topically daily. 30 g 2   ketoconazole  (NIZORAL ) 2 % shampoo Apply topically.     levothyroxine  (SYNTHROID ) 125 MCG tablet TAKE 1 TABLET BY MOUTH DAILY 90 tablet 3   methotrexate  (RHEUMATREX) 2.5 MG tablet Take 20 mg by mouth every Thursday.     NYSTATIN  powder APPLY TOPICALLY TO SKIN FOLDS THREE TIMES DAILY 60 g 1   Omega-3 Fatty Acids (FISH OIL) 1000 MG CAPS Take 1 capsule by mouth daily.     potassium chloride  SA (K-DUR,KLOR-CON ) 20 MEQ tablet Take 1 tablet by mouth  daily 90 tablet 1   propranolol  (INDERAL ) 40 MG tablet 80 mg in the am and 40 mg at night     Red Yeast Rice 600 MG CAPS Take 1 capsule by mouth daily.     torsemide (DEMADEX) 20 MG tablet Take 20 mg by mouth. Patient  states she takes 1 tablet per day as needed     Vibegron  (GEMTESA ) 75 MG TABS Take 1 tablet (75 mg total) by mouth daily. 30 tablet 11   Vibegron  (GEMTESA ) 75 MG TABS Take 1 tablet (75 mg total) by mouth daily. 30 tablet 11   benzonatate  (TESSALON ) 100 MG capsule Take 1-2 capsules (100-200 mg total) by mouth 3 (three) times daily as needed for cough. (Patient not taking: Reported on 01/29/2024) 30 capsule 0   guaiFENesin  200 MG tablet Take 1 tablet (200 mg total) by mouth every 4 (four) hours as needed for cough or to loosen phlegm. (Patient not taking: Reported on 01/29/2024) 30 suppository 0   omeprazole  (PRILOSEC) 20 MG capsule TAKE 1 CAPSULE BY MOUTH TWICE  DAILY BEFORE MEALS (Patient not taking: Reported on 01/29/2024) 180 capsule 3   predniSONE  (DELTASONE ) 20 MG tablet 2 tabs poqday 1-3, 1 tabs poqday  4-6 (Patient not taking: Reported on 01/29/2024) 9 tablet 0   rivaroxaban  (XARELTO ) 20 MG TABS tablet Take 1 tablet (20 mg total) by mouth daily with supper. Please report any side effects to R leg following starting including worsening of bruise, new/changed edema, color variation or temperature change. (Patient not taking: Reported on 01/29/2024) 30 tablet 5   No current facility-administered medications for this visit.    Past Medical History:  Diagnosis Date   A-fib [facility])    Arthritis    Bell palsy 09/04/2014   Congestion of nasal sinus 02/14/2023   Cough with sputum 02/14/2023   GERD (gastroesophageal reflux disease)    Heart disease    Rheumatoid arthritis (HCC)    Rhinosinusitis 02/14/2023   Thyroid  disease    Tremors of nervous system     Past Surgical History:  Procedure Laterality Date   BREAST BIOPSY Left 1988   Benign   BREAST BIOPSY Left 1987   Benign   BREAST EXCISIONAL BIOPSY     BROW LIFT Bilateral 05/23/2016   Procedure: BLEPHAROPLASTY upper eyelid; w/excess skin;  Surgeon: Greig CHRISTELLA Gay, MD;  Location: [facility];  Service: Ophthalmology;  Laterality: Bilateral;   CARPAL TUNNEL RELEASE Bilateral    CHOLECYSTECTOMY     Dr. Dessa   COLONOSCOPY  2006   EYE SURGERY     JOINT REPLACEMENT Bilateral    knees   REPLACEMENT TOTAL KNEE Left 2005   REPLACEMENT TOTAL KNEE Right 1998     Social History   Tobacco Use   Smoking status: Never   Smokeless tobacco: Never  Vaping Use   Vaping status: Never Used  Substance Use Topics   Alcohol use: No   Drug use: No      Family History  Problem Relation Age of Onset   Stroke Mother    Hypertension Mother    Heart disease Father    Arthritis Father    Parkinson's disease Sister    Cancer Sister    Parkinson's disease Brother    Bladder Cancer Brother    Breast cancer Neg Hx    Kidney cancer Neg Hx      Allergies  Allergen Reactions   Bactrim  [Sulfamethoxazole -Trimethoprim ] Anaphylaxis    Mirabegron  Rash    Patient reported it hospitalized her and caused bradycardia   Macrobid  [Nitrofurantoin  Macrocrystal] Rash   Oxybutynin  Rash   Penicillins Rash    Has patient had a PCN reaction causing immediate rash, facial/tongue/throat swelling, SOB or lightheadedness with hypotension: Yes Has patient had a PCN reaction causing severe rash involving mucus membranes  or skin necrosis: No Has patient had a PCN reaction that required hospitalization: No Has patient had a PCN reaction occurring within the last 10 years: Unknown If all of the above answers are NO, then may proceed with Cephalosporin use.       REVIEW OF SYSTEMS (Negative unless checked)   Constitutional: [] Weight loss  [] Fever  [] Chills Cardiac: [] Chest pain   [] Chest pressure   [] Palpitations   [] Shortness of breath when laying flat   [] Shortness of breath at rest   [] Shortness of breath with exertion. Vascular:  [] Pain in legs with walking   [] Pain in legs at rest   [] Pain in legs when laying flat   [] Claudication   [] Pain in feet when walking  [] Pain in feet at rest  [] Pain in feet when laying flat   [] History of DVT   [] Phlebitis   [x] Swelling in legs   [x] Varicose veins   [] Non-healing ulcers Pulmonary:   [] Uses home oxygen   [] Productive cough   [] Hemoptysis   [] Wheeze  [] COPD   [] Asthma Neurologic:  [] Dizziness  [] Blackouts   [] Seizures   [] History of stroke   [] History of TIA  [] Aphasia   [] Temporary blindness   [] Dysphagia   [] Weakness or numbness in arms   [] Weakness or numbness in legs Musculoskeletal:  [x] Arthritis   [] Joint swelling   [x] Joint pain   [] Low back pain Hematologic:  [] Easy bruising  [] Easy bleeding   [] Hypercoagulable state   [] Anemic  [] Hepatitis Gastrointestinal:  [] Blood in stool   [] Vomiting blood  [x] Gastroesophageal reflux/heartburn   [] Abdominal pain Genitourinary:  [] Chronic kidney disease   [] Difficult urination  [] Frequent urination  [] Burning with urination   [] Hematuria Skin:   [] Rashes   [] Ulcers   [] Wounds Psychological:  [] History of anxiety   []  History of major depression.  Physical Examination  BP 136/68 (BP Location: Left Arm)   Pulse 65   Resp 18   Wt 214 lb (97.1 kg)   BMI 35.61 kg/m  Gen:  WD/WN, NAD. Appears younger than stated age. Head: [place]/AT, No temporalis wasting. Ear/Nose/Throat: Hearing grossly intact, nares w/o erythema or drainage Eyes: Conjunctiva clear. Sclera non-icteric Neck: Supple.  Trachea midline Pulmonary:  Good air movement, no use of accessory muscles.  Cardiac: Irregular Vascular:  Vessel Right Left  Radial Palpable Palpable           Musculoskeletal: M/S 5/5 throughout.  No deformity or atrophy.  Stasis dermatitis changes and scattered varicosities present throughout.  Trace to 1+ bilateral lower extremity edema. Neurologic: Sensation grossly intact in extremities.  Symmetrical.  Speech is fluent.  Psychiatric: Judgment intact, Mood & affect appropriate for pt's clinical situation. Dermatologic: No rashes or ulcers noted.  No cellulitis or open wounds.      Labs Recent Results (from the past 2160 hours)  Urinalysis, Complete     Status: Abnormal   Collection Time: 01/28/24 10:39 AM  Result Value Ref Range   Specific Gravity, UA 1.020 1.005 - 1.030   pH, UA 6.0 5.0 - 7.5   Color, UA Yellow Yellow   Appearance Ur Cloudy (A) Clear   Leukocytes,UA 2+ (A) Negative   Protein,UA Negative Negative/Trace   Glucose, UA Negative Negative   Ketones, UA Negative Negative   RBC, UA 3+ (A) Negative   Bilirubin, UA Negative Negative   Urobilinogen, Ur 0.2 0.2 - 1.0 mg/dL   Nitrite, UA Positive (A) Negative   Microscopic Examination See below:     Comment: Microscopic was indicated and  was performed.  Microscopic Examination     Status: Abnormal   Collection Time: 01/28/24 10:39 AM   Urine  Result Value Ref Range   WBC, UA >30 (A) 0 - 5 /hpf   RBC, Urine 11-30 (A) 0 - 2 /hpf   Epithelial Cells (non renal) >10 (A) 0 -  10 /hpf   Mucus, UA Present (A) Not Estab.   Bacteria, UA Many (A) None seen/Few  BLADDER SCAN AMB NON-IMAGING     Status: None   Collection Time: 01/28/24 11:21 AM  Result Value Ref Range   Scan Result 10 ml     Radiology No results found.  Assessment/Plan  Lymphedema Symptoms are better. Continue current measures. Plan follow up in one year.   Poor mobility Poor mobility certainly worsens lower extremity swelling.   Rheumatoid arthritis (HCC) Her arthritis and poor mobility can certainly worsen her lower extremity swelling   HTN (hypertension) blood pressure control important in reducing the progression of atherosclerotic disease. On appropriate oral medications.     Valvular heart disease Cardiac dysfunction particularly MR and TR can create worsening swelling.  Selinda Gu, MD  01/29/2024 12:23 PM    This note was created with Dragon medical transcription system.  Any errors from dictation are purely unintentional

## 2024-01-29 NOTE — Progress Notes (Signed)
 No chief complaint on file.    Subjective:   Glenda Williams is a 83 y.o. female who presents for a Medicare Annual Wellness Visit.  Allergies (verified) Bactrim  [sulfamethoxazole -trimethoprim ], Mirabegron , Macrobid  [nitrofurantoin  macrocrystal], Oxybutynin , and Penicillins   History: Past Medical History:  Diagnosis Date   A-fib (HCC)    Arthritis    Bell palsy 09/04/2014   Congestion of nasal sinus 02/14/2023   Cough with sputum 02/14/2023   GERD (gastroesophageal reflux disease)    Heart disease    Rheumatoid arthritis (HCC)    Rhinosinusitis 02/14/2023   Thyroid  disease    Tremors of nervous system    Past Surgical History:  Procedure Laterality Date   BREAST BIOPSY Left 1988   Benign   BREAST BIOPSY Left 1987   Benign   BREAST EXCISIONAL BIOPSY     BROW LIFT Bilateral 05/23/2016   Procedure: BLEPHAROPLASTY upper eyelid; w/excess skin;  Surgeon: Greig CHRISTELLA Gay, MD;  Location: [facility];  Service: Ophthalmology;  Laterality: Bilateral;   CARPAL TUNNEL RELEASE Bilateral    CHOLECYSTECTOMY     Dr. Dessa   COLONOSCOPY  2006   EYE SURGERY     JOINT REPLACEMENT Bilateral    knees   REPLACEMENT TOTAL KNEE Left 2005   REPLACEMENT TOTAL KNEE Right 1998   Family History  Problem Relation Age of Onset   Stroke Mother    Hypertension Mother    Heart disease Father    Arthritis Father    Parkinson's disease Sister    Cancer Sister    Parkinson's disease Brother    Bladder Cancer Brother    Breast cancer Neg Hx    Kidney cancer Neg Hx    Social History   Occupational History   Not on file  Tobacco Use   Smoking status: Never   Smokeless tobacco: Never  Vaping Use   Vaping status: Never Used  Substance and Sexual Activity   Alcohol use: No   Drug use: No   Sexual activity: Not on file   Tobacco Counseling Counseling given: Not Answered  SDOH Screenings   Food Insecurity: Unknown (01/29/2024)  Housing: Low Risk  (01/29/2024)   Transportation Needs: No Transportation Needs (01/29/2024)  Utilities: Not At Risk (11/02/2023)  Alcohol Screen: Low Risk  (01/23/2023)  Depression (PHQ2-9): Low Risk  (12/13/2023)  Financial Resource Strain: Medium Risk (01/29/2024)  Physical Activity: Insufficiently Active (01/29/2024)  Social Connections: Moderately Isolated (01/29/2024)  Stress: No Stress Concern Present (01/29/2024)  Tobacco Use: Low Risk  (01/29/2024)  Health Literacy: Adequate Health Literacy (01/23/2023)   See flowsheets for full screening details  Depression Screen PHQ 2 & 9 Depression Scale- Over the past 2 weeks, how often have you been bothered by any of the following problems? Little interest or pleasure in doing things: 0 Feeling down, depressed, or hopeless (PHQ Adolescent also includes...irritable): 0 PHQ-2 Total Score: 0 Trouble falling or staying asleep, or sleeping too much: 2 Feeling tired or having little energy: 1 Poor appetite or overeating (PHQ Adolescent also includes...weight loss): 0 Feeling bad about yourself - or that you are a failure or have let yourself or your family down: 0 Trouble concentrating on things, such as reading the newspaper or watching television (PHQ Adolescent also includes...like school work): 0 Moving or speaking so slowly that other people could have noticed. Or the opposite - being so fidgety or restless that you have been moving around a lot more than usual: 1 Thoughts that you would be  better off dead, or of hurting yourself in some way: 0 PHQ-9 Total Score: 4 If you checked off any problems, how difficult have these problems made it for you to do your work, take care of things at home, or get along with other people?: Somewhat difficult     Goals Addressed   None    Functional Status Activities of Daily Living (to include ambulation/medication): (!) (Patient-Rptd) Needs Assist Ambulation: (Patient-Rptd) Independent with device- listed below Home Assistive  Devices/Equipment: Cane Medication Administration: Independent Home Management: (Patient-Rptd) Needs assistance (comment)  Fall Screening Falls in the past year?: (Patient-Rptd) 1 Number of falls in past year: (Patient-Rptd) 1 Was there an injury with Fall?: (Patient-Rptd) 1 Fall Risk Category Calculator: (Patient-Rptd) 3 Patient Fall Risk Level: (Patient-Rptd) High Fall Risk  Fall Risk Patient at Risk for Falls Due to: History of fall(s); Impaired balance/gait; Impaired mobility; Orthopedic patient Fall risk Follow up: Falls evaluation completed; Falls prevention discussed  Advance Directives (For Healthcare) Does Patient Have a Medical Advance Directive?: Yes Does patient want to make changes to medical advance directive?: No - Patient declined Type of Advance Directive: Healthcare Power of [place]; Living will Copy of Healthcare Power of Attorney in Chart?: No - copy requested Copy of Living Will in Chart?: No - copy requested Would patient like information on creating a medical advance directive?: No - Patient declined        Objective:    There were no vitals filed for this visit. There is no height or weight on file to calculate BMI.  Current Medications (verified) Outpatient Encounter Medications as of 01/29/2024  Medication Sig   albuterol  (VENTOLIN  HFA) 108 (90 Base) MCG/ACT inhaler Inhale 1-2 puffs into the lungs every 4 (four) hours as needed for wheezing or shortness of breath.   azelastine  (ASTELIN ) 0.1 % nasal spray Place 2 sprays into both nostrils 2 (two) times daily. Use in each nostril as directed   benzonatate  (TESSALON ) 100 MG capsule Take 1-2 capsules (100-200 mg total) by mouth 3 (three) times daily as needed for cough. (Patient not taking: Reported on 01/29/2024)   cetirizine (ZYRTEC) 10 MG tablet Take 10 mg by mouth daily. (Patient taking differently: Take 10 mg by mouth daily as needed for allergies.)   cholecalciferol  (VITAMIN D ) 400 units TABS tablet  Take 400 Units by mouth daily.   clobetasol (TEMOVATE) 0.05 % external solution APPLY TOPICALLY TWICE DAILY AS  NEEDED FOR ITCHING AND FLAKING  TO THE SCALP AND EARS. USE IT  UNTIL CLEAR OR FOR NO MORE THAN  2 WEEKS. RESTART AS NEEDED   conjugated estrogens  (PREMARIN ) vaginal cream Apply 0.5mg  (pea-sized amount)  just inside the vaginal introitus with a finger-tip on  Monday, Wednesday and Friday nights.   EPINEPHrine  0.3 mg/0.3 mL IJ SOAJ injection Inject 0.3 mLs into the skin once as needed for anaphylaxis.   fluvoxaMINE  (LUVOX ) 50 MG tablet TAKE 3 TABLETS BY MOUTH EVERY NIGHT AT BEDTIME   folic acid  (FOLVITE ) 1 MG tablet Take 1 mg by mouth daily.   gabapentin  (NEURONTIN ) 100 MG capsule Take 1 capsule (100 mg total) by mouth 2 (two) times daily.   golimumab  (SIMPONI  ARIA) 50 MG/4ML SOLN injection Inject into the vein.   guaiFENesin  200 MG tablet Take 1 tablet (200 mg total) by mouth every 4 (four) hours as needed for cough or to loosen phlegm. (Patient not taking: Reported on 01/29/2024)   hydrochlorothiazide  (HYDRODIURIL ) 25 MG tablet Take 25 mg by mouth daily.   hydrocortisone   cream 1 % Apply 1 application  topically as needed for itching.   ketoconazole  (NIZORAL ) 2 % cream Apply 1 application topically daily.   ketoconazole  (NIZORAL ) 2 % shampoo Apply topically.   levothyroxine  (SYNTHROID ) 125 MCG tablet TAKE 1 TABLET BY MOUTH DAILY   methotrexate  (RHEUMATREX) 2.5 MG tablet Take 20 mg by mouth every Thursday.   NYSTATIN  powder APPLY TOPICALLY TO SKIN FOLDS THREE TIMES DAILY   Omega-3 Fatty Acids (FISH OIL) 1000 MG CAPS Take 1 capsule by mouth daily.   omeprazole  (PRILOSEC) 20 MG capsule TAKE 1 CAPSULE BY MOUTH TWICE  DAILY BEFORE MEALS (Patient not taking: Reported on 01/29/2024)   potassium chloride  SA (K-DUR,KLOR-CON ) 20 MEQ tablet Take 1 tablet by mouth  daily   predniSONE  (DELTASONE ) 20 MG tablet 2 tabs poqday 1-3, 1 tabs poqday 4-6 (Patient not taking: Reported on 01/29/2024)    propranolol  (INDERAL ) 40 MG tablet 80 mg in the am and 40 mg at night   Red Yeast Rice 600 MG CAPS Take 1 capsule by mouth daily.   rivaroxaban  (XARELTO ) 20 MG TABS tablet Take 1 tablet (20 mg total) by mouth daily with supper. Please report any side effects to R leg following starting including worsening of bruise, new/changed edema, color variation or temperature change. (Patient not taking: Reported on 01/29/2024)   torsemide (DEMADEX) 20 MG tablet Take 20 mg by mouth. Patient states she takes 1 tablet per day as needed   Vibegron  (GEMTESA ) 75 MG TABS Take 1 tablet (75 mg total) by mouth daily.   Vibegron  (GEMTESA ) 75 MG TABS Take 1 tablet (75 mg total) by mouth daily.   No facility-administered encounter medications on file as of 01/29/2024.   Hearing/Vision screen No results found. Immunizations and Health Maintenance Health Maintenance  Topic Date Due   Zoster Vaccines- Shingrix (1 of 2) Never done   Influenza Vaccine  10/05/2023   COVID-19 Vaccine (4 - 2025-26 season) 11/05/2023   Medicare Annual Wellness (AWV)  01/23/2024   DTaP/Tdap/Td (4 - Td or Tdap) 06/12/2033   Pneumococcal Vaccine: 50+ Years  Completed   Bone Density Scan  Completed   Meningococcal B Vaccine  Aged Out        Assessment/Plan:  This is a routine wellness examination for Glenda Williams.  Patient Care Team: Myrla Jon HERO, MD as PCP - General (Family Medicine) Dessa, Reyes ORN, MD (General Surgery) Bosie Vinie LABOR, MD as Consulting Physician (Cardiology) Maryl Zachary ORN Mickey., MD as Consulting Physician (Rheumatology) Maree Jannett POUR, MD as Consulting Physician (Neurology) Penne Knee, MD (Inactive) as Consulting Physician (Urology) Lenn Standing, MD (Ophthalmology) Ferrel Lionel Sotero RIGGERS (Neurology) Devra Lands, RN as [facility] Care Management  I have personally reviewed and noted the following in the patient's chart:   Medical and social history Use of alcohol, tobacco or illicit drugs   Current medications and supplements including opioid prescriptions. Functional ability and status Nutritional status Physical activity Advanced directives List of other physicians Hospitalizations, surgeries, and ER visits in previous 12 months Vitals Screenings to include cognitive, depression, and falls Referrals and appointments  No orders of the defined types were placed in this encounter.  In addition, I have reviewed and discussed with patient certain preventive protocols, quality metrics, and best practice recommendations. A written personalized care plan for preventive services as well as general preventive health recommendations were provided to patient.   Jhonnie GORMAN Das, LPN   [id number]   No follow-ups on file.  After Visit Summary: (In Person-Declined) Patient  declined AVS at this time.  Nurse Notes: UTD ON SHOTS EXCEPT SHINGRIX; FLU SHOT & PNA SHOT GIVEN; AGED OUT OF COLONOSCOPY & MAMMOGRAM; BDS ORDERED

## 2024-01-29 NOTE — Patient Instructions (Addendum)
 Ms. Glenda Williams,  Thank Williams for taking the time for your Medicare Wellness Visit. I appreciate your continued commitment to your health goals. Please review the care plan we discussed, and feel free to reach out if I can assist Williams further.  Please note that Annual Wellness Visits do not include a physical exam. Some assessments may be limited, especially if the visit was conducted virtually. If needed, we may recommend an in-person follow-up with your provider.  Ongoing Care Seeing your primary care provider every 3 to 6 months helps us  monitor your health and provide consistent, personalized care.   Referrals If a referral was made during today's visit and Williams haven't received any updates within two weeks, please contact the referred provider directly to check on the status.  REFERRAL SENT FOR BONE DENSITY SCAN- PLEASE CONTACT [facility] TO SCHEDULE Williams have an order for:  []   2D Mammogram  []   3D Mammogram  [x]   Bone Density     Please call for appointment:  [facility]  [address] [place] [address] [phone] [facility] [address] [place], [place] [address] [phone] [facility] [address] [place], [place] [address] [phone]   Make sure to wear two-piece clothing.  No lotions, powders, or deodorants the day of the appointment. Make sure to bring picture ID and insurance card.  Bring list of medications Williams are currently taking including any supplements.   Schedule your [place] screening mammogram through MyChart!   Log into your MyChart account.  Go to 'Visit' (or 'Appointments' if on mobile App) --> Schedule an Appointment  Under 'Select a Reason for Visit' choose the Mammogram Screening option.  Complete the pre-visit questions and select the time and place that best fits your schedule.   Recommended  Screenings: FLU SHOT & PREVNAR 20 GIVEN  Health Maintenance  Topic Date Due   Zoster (Shingles) Vaccine (1 of 2) Never done   COVID-19 Vaccine (4 - 2025-26 season) 11/05/2023   Medicare Annual Wellness Visit  01/28/2025   DTaP/Tdap/Td vaccine (4 - Td or Tdap) 06/12/2033   Pneumococcal Vaccine for age over 31  Completed   Flu Shot  Completed   Osteoporosis screening with Bone Density Scan  Completed   Meningitis B Vaccine  Aged Out     Vision: Annual vision screenings are recommended for early detection of glaucoma, cataracts, and diabetic retinopathy. These exams can also reveal signs of chronic conditions such as diabetes and high blood pressure.  Dental: Annual dental screenings help detect early signs of oral cancer, gum disease, and other conditions linked to overall health, including heart disease and diabetes.  Please see the attached documents for additional preventive care recommendations.    NEXT AWV 02/03/25 @ 3:50 PM IN PERSON Take care & I will see ya next year- Glenda Williams

## 2024-02-02 LAB — CULTURE, URINE COMPREHENSIVE

## 2024-02-04 ENCOUNTER — Ambulatory Visit: Payer: Self-pay

## 2024-02-04 DIAGNOSIS — H25013 Cortical age-related cataract, bilateral: Secondary | ICD-10-CM | POA: Diagnosis not present

## 2024-02-04 DIAGNOSIS — N3946 Mixed incontinence: Secondary | ICD-10-CM

## 2024-02-04 DIAGNOSIS — H43813 Vitreous degeneration, bilateral: Secondary | ICD-10-CM | POA: Diagnosis not present

## 2024-02-04 DIAGNOSIS — H2513 Age-related nuclear cataract, bilateral: Secondary | ICD-10-CM | POA: Diagnosis not present

## 2024-02-04 LAB — OPHTHALMOLOGY REPORT-SCANNED

## 2024-02-06 MED ORDER — CIPROFLOXACIN HCL 250 MG PO TABS: 250.0000 mg | ORAL_TABLET | Freq: Two times a day (BID) | ORAL | 0 refills | Status: AC

## 2024-02-11 ENCOUNTER — Other Ambulatory Visit: Payer: Self-pay

## 2024-02-11 NOTE — Patient Outreach (Signed)
 Complex Care Management   Visit Note  02/11/2024  Name:  Glenda Williams MRN: [id number] DOB: December 05, 1940  Situation: Referral received for Complex Care Management related to Heart Failure and RA Pain and Falls I obtained verbal consent from Patient.  Visit completed with Patient  on the phone  Background:   Past Medical History:  Diagnosis Date   A-fib [facility])    Arthritis    Bell palsy 09/04/2014   Congestion of nasal sinus 02/14/2023   Cough with sputum 02/14/2023   GERD (gastroesophageal reflux disease)    Heart disease    Rheumatoid arthritis (HCC)    Rhinosinusitis 02/14/2023   Thyroid  disease    Tremors of nervous system     Assessment: Patient Reported Symptoms:  Cognitive Cognitive Status: No symptoms reported Cognitive/Intellectual Conditions Management [RPT]: None reported or documented in medical history or problem list   Health Maintenance Behaviors: Annual physical exam, Healthy diet  Neurological Neurological Review of Symptoms: Dizziness Neurological Comment: rare dizziness- improved, usually happens in bringht sun - avoids  HEENT HEENT Symptoms Reported: Other: HEENT Comment: cataract surgery to be scheduled    Cardiovascular Cardiovascular Symptoms Reported: No symptoms reported Does patient have uncontrolled Hypertension?: No Is patient checking Blood Pressure at home?: Yes Patient's Recent BP reading at home: lymphedema pumps - using 2-3 x week prn, no swelling today, no HF sx, not needing prn torsemide, weighing most days 214.6 lb discussed HF AP and when to take torsemide, not checking BP regularly at home, stable at recent appointments excepts when walked from car instead of wheelchair Cardiovascular Management Strategies: Routine screening, Medication therapy Weight: 214 lb 9.6 oz (97.3 kg)  Respiratory Respiratory Symptoms Reported: No symptoms reported Respiratory Management Strategies: Routine screening, Medication therapy  Endocrine Endocrine  Symptoms Reported: Not assessed Is patient diabetic?: No    Gastrointestinal Gastrointestinal Symptoms Reported: No symptoms reported Gastrointestinal Management Strategies: Incontinence garment/pad    Genitourinary Genitourinary Symptoms Reported: Incontinence Additional Genitourinary Details: no vaginal bleeding improvement in control with Gemtesa , currently on Cipro  for UTI, no sx at this time Genitourinary Management Strategies: Incontinence garment/pad, Medical device  Integumentary Integumentary Symptoms Reported: No symptoms reported    Musculoskeletal Musculoskelatal Symptoms Reviewed: Back pain, Difficulty walking, Limited mobility, Unsteady gait Additional Musculoskeletal Details: PT completed - continues to move as much as possible, no falls since last visit, walker/cane/wheelchair, tremors, left hand 2 fingers locking up, RA pain/infusions q 6-8 Jonasia Coiner, next infusion 02/25/24, back injection, calling today to schedule again rates pain not good, tylenol  prn Musculoskeletal Management Strategies: Routine screening, Exercise Falls in the past year?: Yes Number of falls in past year: 2 or more Was there an injury with Fall?: Yes Fall Risk Category Calculator: 3 Patient Fall Risk Level: High Fall Risk Patient at Risk for Falls Due to: Impaired balance/gait, Impaired mobility Fall risk Follow up: Falls evaluation completed, Falls prevention discussed  Psychosocial Psychosocial Symptoms Reported: No symptoms reported Behavioral Management Strategies: Activity, Support system Behavioral Health Comment: recent move into daughter's home - happy Major Change/Loss/Stressor/Fears (CP): Medical condition, self Techniques to Cope with Loss/Stress/Change: Diversional activities, Exercise      02/11/2024    PHQ2-9 Depression Screening   Little interest or pleasure in doing things Not at all  Feeling down, depressed, or hopeless Not at all  PHQ-2 - Total Score 0  Trouble falling or  staying asleep, or sleeping too much    Feeling tired or having little energy    Poor appetite or overeating  Feeling bad about yourself - or that you are a failure or have let yourself or your family down    Trouble concentrating on things, such as reading the newspaper or watching television    Moving or speaking so slowly that other people could have noticed.  Or the opposite - being so fidgety or restless that you have been moving around a lot more than usual    Thoughts that you would be better off dead, or hurting yourself in some way    PHQ2-9 Total Score    If you checked off any problems, how difficult have these problems made it for you to do your work, take care of things at home, or get along with other people    Depression Interventions/Treatment      Today's Vitals   02/11/24 1018  Weight: 214 lb 9.6 oz (97.3 kg)      Medications Reviewed Today     Reviewed by Devra Lands, RN (Registered Nurse) on 02/11/24 at 1013  Med List Status: <None>   Medication Order Taking? Sig Documenting Provider Last Dose Status Informant  albuterol  (VENTOLIN  HFA) 108 (90 Base) MCG/ACT inhaler [id number] Yes Inhale 1-2 puffs into the lungs every 4 (four) hours as needed for wheezing or shortness of breath. Tapia, Leisa, PA-C  Active   azelastine  (ASTELIN ) 0.1 % nasal spray [id number] Yes Place 2 sprays into both nostrils 2 (two) times daily. Use in each nostril as directed  Patient taking differently: Place 2 sprays into both nostrils 2 (two) times daily. Use in each nostril as directed TAKES PRN   Wellington Curtis LABOR, FNP  Active   benzonatate  (TESSALON ) 100 MG capsule [id number]  Take 1-2 capsules (100-200 mg total) by mouth 3 (three) times daily as needed for cough.  Patient not taking: Reported on 01/29/2024   Tapia, Leisa, PA-C  Active   cetirizine (ZYRTEC) 10 MG tablet [id number] Yes Take 10 mg by mouth daily. [provider]  Active Self, Child, Pharmacy Records            Med Note [name], [name]   Wed May 30, 2023  4:16 PM) Patient states taking every day  cholecalciferol  (VITAMIN D ) 400 units TABS tablet [id number] Yes Take 400 Units by mouth daily. [provider]  Active Self, Child, Pharmacy Records  ciprofloxacin  (CIPRO ) 250 MG tablet [id number] Yes Take 1 tablet (250 mg total) by mouth 2 (two) times daily for 7 days. Gaston Hamilton, MD  Active   clobetasol (TEMOVATE) 0.05 % external solution [id number] Yes APPLY TOPICALLY TWICE DAILY AS  NEEDED FOR ITCHING AND FLAKING  TO THE SCALP AND EARS. USE IT  UNTIL CLEAR OR FOR NO MORE THAN  2 Gerrard Crystal. RESTART AS NEEDED [provider]  Active   conjugated estrogens  (PREMARIN ) vaginal cream [id number] Yes Apply 0.5mg  (pea-sized amount)  just inside the vaginal introitus with a finger-tip on  Monday, Wednesday and Friday nights. Helon Clotilda LABOR, PA-C  Active Self, Child, Pharmacy Records  EPINEPHrine  0.3 mg/0.3 mL IJ SOAJ injection [id number] Yes Inject 0.3 mLs into the skin once as needed for anaphylaxis. [provider]  Active Self, Child, Pharmacy Records  fluvoxaMINE  (LUVOX ) 50 MG tablet [id number] Yes TAKE 3 TABLETS BY MOUTH EVERY NIGHT AT BEDTIME Myrla Jon HERO, MD  Active   folic acid  (FOLVITE ) 1 MG tablet [id number] Yes Take 1 mg by mouth daily. [provider]  Active Self, Child, Pharmacy Records  gabapentin  (NEURONTIN ) 100 MG capsule [id number] Yes Take  1 capsule (100 mg total) by mouth 2 (two) times daily. Bacigalupo, Angela M, MD  Active   golimumab  (SIMPONI  ARIA) 50 MG/4ML SOLN injection [id number] Yes Inject into the vein.  Patient taking differently: Inject into the vein. EVERY 6-8 Chaylee Ehrsam AT DR.PATEL'S OFFICE   [provider]  Active Self  hydrochlorothiazide  (HYDRODIURIL ) 25 MG tablet [id number] Yes Take 25 mg by mouth daily. [provider]  Active Self, Child, Pharmacy Records  hydrocortisone  cream 1 % [id number] Yes Apply 1 application  topically as  needed for itching. [provider]  Active Self, Child, Pharmacy Records  ketoconazole  (NIZORAL ) 2 % cream [id number] Yes Apply 1 application topically daily. Myrla Jon HERO, MD  Active Self, Child, Pharmacy Records           Med Note [name], [facility] E   Tue Jul 31, 2023  1:57 PM) Patient states uses as needed.   ketoconazole  (NIZORAL ) 2 % shampoo [id number] Yes Apply topically. [provider]  Active   levothyroxine  (SYNTHROID ) 125 MCG tablet [id number] Yes TAKE 1 TABLET BY MOUTH DAILY Bacigalupo, Jon HERO, MD  Active   methotrexate  (RHEUMATREX) 2.5 MG tablet [id number] Yes Take 20 mg by mouth every Thursday. [provider]  Active Self, Child, Pharmacy Records           Med Note WORLEY LUCIANO GORMAN Austin Nov 04, 2017 12:52 PM)    NYSTATIN  powder [id number] Yes APPLY TOPICALLY TO SKIN FOLDS THREE TIMES DAILY Emilio Kelly DASEN, FNP  Active   Omega-3 Fatty Acids (FISH OIL) 1000 MG CAPS [id number] Yes Take 1 capsule by mouth daily. [provider]  Active Self, Child, Pharmacy Records  omeprazole  (PRILOSEC) 20 MG capsule [id number] Yes TAKE 1 CAPSULE BY MOUTH TWICE  DAILY BEFORE MEALS Bacigalupo, Jon HERO, MD  Active   potassium chloride  SA (K-DUR,KLOR-CON ) 20 MEQ tablet [id number] Yes Take 1 tablet by mouth  daily Agapito Mom, MD  Active Self, Child, Pharmacy Records  predniSONE  (DELTASONE ) 20 MG tablet [id number]  2 tabs poqday 1-3, 1 tabs poqday 4-6  Patient not taking: Reported on 01/29/2024   Tapia, Leisa, PA-C  Active   propranolol  (INDERAL ) 40 MG tablet [id number] Yes 80 mg in the am and 40 mg at night [provider]  Active Self, Child, Pharmacy Records  Red Yeast Rice 600 MG CAPS [id number] Yes Take 1 capsule by mouth daily. [provider]  Active Self, Child, Pharmacy Records  rivaroxaban  (XARELTO ) 20 MG TABS tablet [id number] Yes Take 1 tablet (20 mg total) by mouth daily with supper. Please report any side effects to R leg following  starting including worsening of bruise, new/changed edema, color variation or temperature change. Emilio Kelly T, FNP  Active   torsemide (DEMADEX) 20 MG tablet [id number] Yes Take 20 mg by mouth. Patient states she takes 1 tablet per day as needed [provider]  Active Self  Vibegron  (GEMTESA ) 75 MG TABS [id number] Yes Take 1 tablet (75 mg total) by mouth daily. Gaston Hamilton, MD  Active   Vibegron  (GEMTESA ) 75 MG TABS [id number]  Take 1 tablet (75 mg total) by mouth daily. Gaston Hamilton, MD  Active             Recommendation:   PCP Follow-up Continue Current Plan of Care  Follow Up Plan:   Telephone follow-up 6 Labrian Torregrossa as discussed  Nestora Duos, MSN, RN [facility]  [facility] RN Care Manager Direct Dial: [phone] Fax: [phone]

## 2024-02-11 NOTE — Patient Instructions (Signed)
 Visit Information  Thank you for taking time to visit with me today. Please don't hesitate to contact me if I can be of assistance to you before our next scheduled appointment.  Your next care management appointment is by telephone on 03/24/2024 at 10:00 am  Telephone follow-up 6 Carley Strickling as discussed  Please call the care guide team at [phone] if you need to cancel, schedule, or reschedule an appointment.   Please call the Suicide and Crisis Lifeline: 988 call the [place]  National Suicide Prevention Lifeline: (450) 553-3490 or TTY: 818-589-8940 TTY 475-560-8964) to talk to a trained counselor call 1-800-273-TALK (toll free, 24 hour hotline) go to [facility] [address] [place] [phone]) call 911 if you are experiencing a Mental Health or Behavioral Health Crisis or need someone to talk to.  Nestora Duos, MSN, RN [facility], [facility] RN Care Manager Direct Dial: [phone] Fax: [phone]

## 2024-02-14 ENCOUNTER — Telehealth: Payer: Self-pay | Admitting: Family Medicine

## 2024-02-14 NOTE — Telephone Encounter (Signed)
[facility] faxed refill request for the following medications:  levothyroxine (SYNTHROID) 125 MCG tablet   Please advise.  

## 2024-02-15 ENCOUNTER — Other Ambulatory Visit: Payer: Self-pay

## 2024-02-15 DIAGNOSIS — Z111 Encounter for screening for respiratory tuberculosis: Secondary | ICD-10-CM | POA: Diagnosis not present

## 2024-02-15 DIAGNOSIS — M0579 Rheumatoid arthritis with rheumatoid factor of multiple sites without organ or systems involvement: Secondary | ICD-10-CM | POA: Diagnosis not present

## 2024-02-15 MED ORDER — LEVOTHYROXINE SODIUM 125 MCG PO TABS: 125.0000 ug | ORAL_TABLET | Freq: Every day | ORAL | 3 refills | Status: AC

## 2024-02-22 ENCOUNTER — Telehealth: Payer: Self-pay | Admitting: Family Medicine

## 2024-02-22 NOTE — Telephone Encounter (Signed)
 Refill Request   Pharmacy: [facility]  Medication:  levothyroxine  (SYNTHROID ) 125 MCG tablet   Quantity (if provided): 90  Please send in refill request.

## 2024-02-22 NOTE — Telephone Encounter (Signed)
 [facility]vised [facility]24 Rx refill declined

## 2024-03-04 ENCOUNTER — Telehealth: Payer: Self-pay

## 2024-03-04 NOTE — Telephone Encounter (Signed)
 Unable to reach patient. If call is returned ok for E2C2 to advise per provider

## 2024-03-04 NOTE — Telephone Encounter (Signed)
 She should call her surgeon and see if they would like her to reschedule.  Given that is 10 days away, she may be better by then, but no way to be sure.

## 2024-03-04 NOTE — Telephone Encounter (Signed)
 Copied from CRM #[id number]. Topic: Clinical - Medical Advice >> Mar 03, 2024  4:54 PM Wess RAMAN wrote: Reason for CRM: Patient has a cold and has eye surgery on 03/14/24. She would like to know if she should keep her appt or reschedule. She said she does not think you are allowed to cough after having cataract surgery.  Callback #: [phone] or [phone]

## 2024-03-05 NOTE — Telephone Encounter (Signed)
 Spoke with patient, advised of Dr. Rona message. Patient verbalized understanding.

## 2024-03-08 ENCOUNTER — Emergency Department

## 2024-03-08 ENCOUNTER — Other Ambulatory Visit: Payer: Self-pay

## 2024-03-08 ENCOUNTER — Emergency Department
Admission: EM | Admit: 2024-03-08 | Discharge: 2024-03-08 | Disposition: A | Discharge status: Home / Self Care | Attending: Emergency Medicine | Admitting: Emergency Medicine

## 2024-03-08 DIAGNOSIS — S0990XA Unspecified injury of head, initial encounter: Secondary | ICD-10-CM | POA: Insufficient documentation

## 2024-03-08 DIAGNOSIS — I482 Chronic atrial fibrillation, unspecified: Secondary | ICD-10-CM | POA: Insufficient documentation

## 2024-03-08 DIAGNOSIS — W1811XA Fall from or off toilet without subsequent striking against object, initial encounter: Secondary | ICD-10-CM | POA: Insufficient documentation

## 2024-03-08 DIAGNOSIS — S0993XA Unspecified injury of face, initial encounter: Secondary | ICD-10-CM | POA: Diagnosis present

## 2024-03-08 DIAGNOSIS — W19XXXA Unspecified fall, initial encounter: Secondary | ICD-10-CM

## 2024-03-08 DIAGNOSIS — Z7901 Long term (current) use of anticoagulants: Secondary | ICD-10-CM | POA: Diagnosis not present

## 2024-03-08 DIAGNOSIS — S0181XA Laceration without foreign body of other part of head, initial encounter: Secondary | ICD-10-CM | POA: Insufficient documentation

## 2024-03-08 LAB — COMPREHENSIVE METABOLIC PANEL WITH GFR
ALT: 18 U/L (ref 0–44)
AST: 30 U/L (ref 15–41)
Albumin: 3.8 g/dL (ref 3.5–5.0)
Alkaline Phosphatase: 72 U/L (ref 38–126)
Anion gap: 8 (ref 5–15)
BUN: 17 mg/dL (ref 8–23)
CO2: 32 mmol/L (ref 22–32)
Calcium: 9.5 mg/dL (ref 8.9–10.3)
Chloride: 100 mmol/L (ref 98–111)
Creatinine, Ser: 0.85 mg/dL (ref 0.44–1.00)
GFR, Estimated: >60 mL/min
Glucose, Bld: 110 mg/dL — ABNORMAL HIGH (ref 70–99)
Potassium: 3.3 mmol/L — ABNORMAL LOW (ref 3.5–5.1)
Sodium: 140 mmol/L (ref 135–145)
Total Bilirubin: 0.4 mg/dL (ref 0.0–1.2)
Total Protein: 7.8 g/dL (ref 6.5–8.1)

## 2024-03-08 LAB — CBC
HCT: 35.2 % — ABNORMAL LOW (ref 36.0–46.0)
Hemoglobin: 10.9 g/dL — ABNORMAL LOW (ref 12.0–15.0)
MCH: 25.8 pg — ABNORMAL LOW (ref 26.0–34.0)
MCHC: 31 g/dL (ref 30.0–36.0)
MCV: 83.4 fL (ref 80.0–100.0)
Platelets: 263 K/uL (ref 150–400)
RBC: 4.22 MIL/uL (ref 3.87–5.11)
RDW: 15.6 % — ABNORMAL HIGH (ref 11.5–15.5)
WBC: 6.2 K/uL (ref 4.0–10.5)
nRBC: 0 % (ref 0.0–0.2)

## 2024-03-08 MED ORDER — LIDOCAINE-EPINEPHRINE (PF) 2 %-1:200000 IJ SOLN
20.0000 mL | Freq: Once | INTRAMUSCULAR | Status: AC
  Administered 2024-03-08: 20 mL
  Filled 2024-03-08: qty 20

## 2024-03-08 MED ORDER — ERYTHROMYCIN 5 MG/GM OP OINT: 1.0000 | TOPICAL_OINTMENT | Freq: Every day | OPHTHALMIC | 0 refills | Status: AC

## 2024-03-08 MED ORDER — ACETAMINOPHEN 500 MG PO TABS
1000.0000 mg | ORAL_TABLET | Freq: Once | ORAL | Status: AC
  Administered 2024-03-08: 1000 mg via ORAL
  Filled 2024-03-08: qty 2

## 2024-03-08 NOTE — Consult Note (Signed)
 Subjective: Glenda Williams is an 84 y/o F with POH cataracts OU, PVD OU, presenting to ED after fall. Noted to have significant periorbital edema and laceration at lateral canthus so ophthalmology consulted for evaluation.  She was seated this morning and fell from sitting and struck the left side of her head. She endorses left facial pain and headache, She does not note painful eye movements or nausea with eye movements. She does not note eye pain. With eyelids assisted in opening, she says her vision feels normal. She does not have flashes, floaters, or curtains of the vision. She has cataract surgery scheduled this upcoming week in the left eye.  In the ED, she had facial lacerations repaired by ED MD. CT head/MF shows no intracranial hemorrhage, no facial bone fracture, no retrobulbar hematoma, and intact globe, with periorbital soft tissue swelling.  Objective: Vital signs in last 24 hours: Temp:  [97.8 F (36.6 C)] 97.8 F (36.6 C) (01/03 0739) Pulse Rate:  [60-73] 71 (01/03 1230) Resp:  [16-20] 16 (01/03 1230) BP: (145-165)/(59-84) 147/67 (01/03 1230) SpO2:  [94 %-100 %] 100 % (01/03 1230)  Exam OS:  [facility] [place] near: 20/40- IOP tonopen: 22 Pupils: no rAPD OU EOMs full nonpainful CVF unreliable  Bedside 20D exam: Lids/lashes: tense 2-3+ upper and lower lid edema. Lid margins intact. Linear laceration approximately 0.5cm beneath lower lid margin extending from centrally to laterally with mild wound gape, laceration approximately 1cm from lateral canthus with moderate wound gape. Lids well apposed to globe. Conjunctiva/Sclera: no abrasion or laceration, trace injection Cornea: clear without staining, no laceration or abrasion AC: formed Iris: flat, no peaking Lens: Moderate nuclear sclerosis  Recent Labs    03/08/24 0749  WBC 6.2  HGB 10.9*  HCT 35.2*  NA 140  K 3.3*  CL 100  CO2 32  BUN 17  CREATININE 0.85    Studies/Results: CT Cervical Spine Wo Contrast Result Date:  03/08/2024 EXAM: CT CERVICAL SPINE WITHOUT CONTRAST 03/08/2024 08:49:53 AM TECHNIQUE: CT of the cervical spine was performed without the administration of intravenous contrast. Multiplanar reformatted images are provided for review. Automated exposure control, iterative reconstruction, and/or weight based adjustment of the mA/kV was utilized to reduce the radiation dose to as low as reasonably achievable. COMPARISON: None available. CLINICAL HISTORY: Neck trauma (Age >= 65y) FINDINGS: BONES AND ALIGNMENT: There is mild reversal of the normal cervical lordosis. No acute fracture or traumatic malalignment. DEGENERATIVE CHANGES: There is moderate chronic degenerative disc disease at C6-C7, with posterior bulging disc osteophyte complex resulting in mild-to-moderate central spinal canal stenosis and mild bilateral neural foraminal stenosis. SOFT TISSUES: No prevertebral soft tissue swelling. IMPRESSION: 1. No acute traumatic injury. Mild reversal of the normal cervical lordosis, which may be positional or related to muscle spasm. 2. Moderate chronic degenerative disc disease at C6-7 with posterior bulging disc osteophyte complex resulting in mild-to-moderate central spinal canal stenosis and mild bilateral neural foraminal stenosis. Electronically signed by: Evalene Coho MD 03/08/2024 08:56 AM EST RP Workstation: [id number]   CT Maxillofacial Wo Contrast Result Date: 03/08/2024 EXAM: CT Face without contrast 03/08/2024 08:49:53 AM TECHNIQUE: CT of the face was performed without the administration of intravenous contrast. Multiplanar reformatted images are provided for review. Automated exposure control, iterative reconstruction, and/or weight based adjustment of the mA/kV was utilized to reduce the radiation dose to as low as reasonably achievable. COMPARISON: None available CLINICAL HISTORY: Facial trauma, blunt. FINDINGS: AERODIGESTIVE TRACT: No mass. No edema. SALIVARY GLANDS: No acute abnormality. LYMPH NODES:  No suspicious cervical lymphadenopathy. SOFT TISSUES: Moderate left supraorbital soft tissue swelling. BRAIN, ORBITS AND SINUSES: The globes are intact. There is mild mucosal disease within the right maxillary sinus. There are a few opacified left ethmoid air cells. There is slight leftward deviation of the nasal septum. BONES: The facial bones are intact. No suspicious bone lesion. IMPRESSION: 1. No acute facial bone fracture. The globes are intact. 2. Moderate left supraorbital soft tissue swelling. 3. Mild mucosal disease within the right maxillary sinus and a few opacified left ethmoid air cells. 4. Slight leftward deviation of the nasal septum. Electronically signed by: Evalene Coho MD 03/08/2024 08:55 AM EST RP Workstation: [id number]   CT Head Wo Contrast Result Date: 03/08/2024 EXAM: CT HEAD WITHOUT CONTRAST 03/08/2024 08:49:53 AM TECHNIQUE: CT of the head was performed without the administration of intravenous contrast. Automated exposure control, iterative reconstruction, and/or weight based adjustment of the mA/kV was utilized to reduce the radiation dose to as low as reasonably achievable. COMPARISON: 06/13/2023 CLINICAL HISTORY: Head trauma, minor (Age >= 65y) FINDINGS: BRAIN AND VENTRICLES: No acute hemorrhage. No evidence of acute infarct. Mild age-related cerebral volume loss. Atherosclerotic calcifications in large vessels of skull base. No hydrocephalus. No extra-axial collection. No mass effect or midline shift. ORBITS: Left periorbital soft tissue swelling. SINUSES: No acute abnormality. SOFT TISSUES AND SKULL: Left frontal scalp hematoma. No skull fracture. IMPRESSION: 1. No acute intracranial abnormality. 2. Left frontal scalp hematoma and left periorbital soft tissue swelling. 3. Mild age-related cerebral volume loss and intracranial atherosclerotic calcifications. Electronically signed by: Evalene Coho MD 03/08/2024 08:53 AM EST RP Workstation: [id number]    Procedure Note:  Lid/facial laceration repair left side  R/B/A discussed with patient and family and verbal consent obtained. The area was cleaned with sterile balanced salt solution and 5% betadine solution and aseptic technique was used. The periorbital subcutaneous tissue was anesthetized with lidocaine  1% epinephrine  1:100,000, approximately 2.5cc. The skin laceration approximately 0.5cm beneath lower lid margin extending from centrally to laterally with mild wound gape, laceration approximately 1cm from lateral canthus with moderate wound gape was reapproximated with 5 simple interrupted absorbable plain gut sutures. No orbital fat was noted. Tissue lateral to the canthus was very friable so was unable to be fully reapproximated and was left to heal by secondary intention. The area was cleaned with sterile balanced salt solution, and maxitrol was applied to the wound. The wound was dressed with sterile gauzed with moderate pressure. The patient tolerated the procedure well and no complications were noted.   Assessment/Plan: 1) left lid/facial laceration 2) periorbital edema - s/p mechanical fall with periorbital edema and facial/lid lacerations. Globe intact with baseline visual function and no other evidence of ocular injury  - facial lacerations largely repaired by ED MD, left lower lid laceration and laceration adjacent to lateral canthus repaired by ophthalmology - continue pressure dressing with gauze and tape - erythromycin  ointment TID to lacerations - Ice packs q2h for periorbital edema - strict return precautions discussed with patient and family - patient to follow up at [facility] Tues-Thursday of this week, sooner if needed. Plan for dilated fundus exam once edema has improved.    LOS: 0 days   Curtistine PARAS [facility] 1/3/20261:12 PM

## 2024-03-08 NOTE — ED Provider Notes (Signed)
 "  [facility] Provider Note    Event Date/Time   First MD Initiated Contact with Patient 03/08/24 0745     (approximate)   History   Fall   HPI  Glenda Williams is a 84 y.o. female with history of atrial fibrillation on Xarelto  who presents after a fall.  Patient reports she was getting up from the toilet lost her balance and fell.  Son reports this has happened several times in the last year, always seems to be related to getting up from the toilet.  She suffered a laceration to her left face.  Her right elbow is sore but she is moving it well.  No lower extremity injuries.  No back pain     Physical Exam   Triage Vital Signs: ED Triage Vitals  Encounter Vitals Group     BP 03/08/24 0739 (!) 161/84     Girls Systolic BP Percentile --      Girls Diastolic BP Percentile --      Boys Systolic BP Percentile --      Boys Diastolic BP Percentile --      Pulse Rate 03/08/24 0739 63     Resp 03/08/24 0739 20     Temp 03/08/24 0739 97.8 F (36.6 C)     Temp Source 03/08/24 0739 Oral     SpO2 03/08/24 0739 94 %     Weight --      Height --      Head Circumference --      Peak Flow --      Pain Score 03/08/24 0736 7     Pain Loc --      Pain Education --      Exclude from Growth Chart --     Most recent vital signs: Vitals:   03/08/24 1200 03/08/24 1230  BP: (!) 165/59 (!) 147/67  Pulse: 73 71  Resp: 16 16  Temp:    SpO2: 100% 100%     General: Awake, no distress.  CV:  Good peripheral perfusion.  Resp:  Normal effort.  Abd:  No distention.  Other:  Good range of motion of the lower extremities, no pain with axial load on both hips.  No vertebral tenderness to palpation.  Significant laceration to the left face lateral to the left eye as well as laceration to the forehead   ED Results / Procedures / Treatments   Labs (all labs ordered are listed, but only abnormal results are displayed) Labs Reviewed  COMPREHENSIVE METABOLIC PANEL WITH  GFR - Abnormal; Notable for the following components:      Result Value   Potassium 3.3 (*)    Glucose, Bld 110 (*)    All other components within normal limits  CBC - Abnormal; Notable for the following components:   Hemoglobin 10.9 (*)    HCT 35.2 (*)    MCH 25.8 (*)    RDW 15.6 (*)    All other components within normal limits     EKG  ED ECG REPORT I, Lamar Price, the attending physician, personally viewed and interpreted this ECG.  Date: 03/08/2024  Rhythm: Atrial fibrillation QRS Axis: normal Intervals: Abnormal ST/T Wave abnormalities: normal Narrative Interpretation: no evidence of acute ischemia    RADIOLOGY CT head max face and cervical spine without acute abnormality    PROCEDURES:  Critical Care performed:   .Laceration Repair  Date/Time: 03/08/2024 11:37 AM  Performed by: Price Lamar, MD Authorized by: Price Lamar,  MD   Consent:    Consent obtained:  Verbal   Risks discussed:  Infection, pain, poor cosmetic result and poor wound healing Universal protocol:    Patient identity confirmed:  Arm band Anesthesia:    Anesthesia method:  Local infiltration   Local anesthetic:  Lidocaine  2% WITH epi Laceration details:    Location:  Face   Face location:  L eyebrow   Length (cm):  3 Exploration:    Hemostasis achieved with:  Direct pressure   Contaminated: no   Treatment:    Area cleansed with:  Povidone-iodine   Amount of cleaning:  Standard   Debridement:  None Skin repair:    Repair method:  Sutures   Suture size:  5-0   Suture material:  Nylon   Suture technique:  Simple interrupted   Number of sutures:  3 Approximation:    Approximation:  Close Repair type:    Repair type:  Simple Post-procedure details:    Dressing:  Sterile dressing   Procedure completion:  Tolerated well, no immediate complications .Laceration Repair  Date/Time: 03/08/2024 1:19 PM  Performed by: Arlander Charleston, MD Authorized by: Arlander Charleston, MD    Consent:    Consent obtained:  Verbal   Consent given by:  Patient   Risks discussed:  Infection, pain, poor cosmetic result and poor wound healing Laceration details:    Location:  Face   Face location:  L cheek   Length (cm):  8 Exploration:    Contaminated: no   Treatment:    Area cleansed with:  Povidone-iodine   Amount of cleaning:  Standard Skin repair:    Repair method:  Sutures   Suture size:  5-0   Suture material:  Plain gut   Suture technique:  Simple interrupted   Number of sutures:  7 Approximation:    Approximation:  Close Repair type:    Repair type:  Intermediate Post-procedure details:    Dressing:  Bulky dressing   Procedure completion:  Tolerated well, no immediate complications    MEDICATIONS ORDERED IN ED: Medications  lidocaine -EPINEPHrine  (XYLOCAINE  W/EPI) 2 %-1:200000 (PF) injection 20 mL (20 mLs Infiltration Given 03/08/24 0936)  acetaminophen  (TYLENOL ) tablet 1,000 mg (1,000 mg Oral Given 03/08/24 1127)     IMPRESSION / MDM / ASSESSMENT AND PLAN / ED COURSE  I reviewed the triage vital signs and the nursing notes. Patient's presentation is most consistent with acute presentation with potential threat to life or bodily function.  Patient on Xarelto  presents after fall with head injury.  Will send for CT head, CT cervical spine, CT max face to exclude bony injury, intracranial hemorrhage.  Will check labs to ensure no electrolyte abnormalities or severe anemia  EKG is unchanged from prior.  Wound repaired, and area underneath the left eye has a skin tear, do not think is amenable to suturing, discussed with Dr. Georgia of ophthalmology, he will evaluate the patient in the emergency department, greatly appreciate his assistance     FINAL CLINICAL IMPRESSION(S) / ED DIAGNOSES   Final diagnoses:  Fall, initial encounter  Facial laceration, initial encounter  Injury of head, initial encounter     Rx / DC Orders   ED Discharge Orders           Ordered    erythromycin  ophthalmic ointment  Daily at bedtime        03/08/24 1230             Note:  This document was prepared  using Conservation officer, historic buildings and may include unintentional dictation errors.   Arlander Charleston, MD 03/08/24 1320  "

## 2024-03-08 NOTE — ED Triage Notes (Addendum)
 Pt to ED via POV from home. Pt reports was on the toilet and stood up and doesn't know how she fell. Pt has large gash to left eye with bruising to left eye and swelling. Laceration still actively bleeding. Pt is on blood thinner. Son states pt was feeling wobbly yesterday. Pt denies dizziness on arrival.

## 2024-03-12 ENCOUNTER — Ambulatory Visit: Admission: RE | Admit: 2024-03-12 | Source: Home / Self Care | Admitting: Ophthalmology

## 2024-03-12 ENCOUNTER — Encounter: Admission: RE | Payer: Self-pay | Source: Home / Self Care

## 2024-03-12 SURGERY — PHACOEMULSIFICATION, CATARACT, WITH IOL INSERTION
Anesthesia: Topical | Laterality: Left

## 2024-03-18 ENCOUNTER — Ambulatory Visit: Admitting: Family Medicine

## 2024-03-18 ENCOUNTER — Encounter: Payer: Self-pay | Admitting: Family Medicine

## 2024-03-18 VITALS — BP 124/68

## 2024-03-18 DIAGNOSIS — I10 Essential (primary) hypertension: Secondary | ICD-10-CM | POA: Diagnosis not present

## 2024-03-18 DIAGNOSIS — R296 Repeated falls: Secondary | ICD-10-CM | POA: Diagnosis not present

## 2024-03-18 DIAGNOSIS — E039 Hypothyroidism, unspecified: Secondary | ICD-10-CM | POA: Diagnosis not present

## 2024-03-18 DIAGNOSIS — I4891 Unspecified atrial fibrillation: Secondary | ICD-10-CM

## 2024-03-18 DIAGNOSIS — I480 Paroxysmal atrial fibrillation: Secondary | ICD-10-CM

## 2024-03-18 DIAGNOSIS — Z7409 Other reduced mobility: Secondary | ICD-10-CM | POA: Diagnosis not present

## 2024-03-18 DIAGNOSIS — E7800 Pure hypercholesterolemia, unspecified: Secondary | ICD-10-CM | POA: Diagnosis not present

## 2024-03-18 DIAGNOSIS — Z789 Other specified health status: Secondary | ICD-10-CM

## 2024-03-18 DIAGNOSIS — R7303 Prediabetes: Secondary | ICD-10-CM | POA: Diagnosis not present

## 2024-03-18 DIAGNOSIS — M069 Rheumatoid arthritis, unspecified: Secondary | ICD-10-CM

## 2024-03-18 NOTE — Assessment & Plan Note (Signed)
 F/b Rheum On Simponi  Aria infusions

## 2024-03-18 NOTE — Progress Notes (Signed)
 "   MyChart Video Visit    Virtual Visit via Video Note   This format is felt to be most appropriate for this patient at this time. Physical exam was limited by quality of the video and audio technology used for the visit.    Patient location: home Provider location: [facility] Persons involved in the visit: patient, provider  I discussed the limitations of evaluation and management by telemedicine and the availability of in person appointments. The patient expressed understanding and agreed to proceed.  Patient: Glenda Williams   DOB: 1940/10/28   84 y.o. Female  MRN: [id number] Visit Date: 03/18/2024  Today's healthcare provider: Jon Eva, MD   No chief complaint on file.  Subjective    HPI   Discussed the use of AI scribe software for clinical note transcription with the patient, who gave verbal consent to proceed.  History of Present Illness   Glenda Williams is an 84 year old female who presents with a recent fall resulting in facial injury. She is accompanied by her daughter, Montie, who is her primary caregiver.  She fell at 6:00 AM on a Saturday, causing a facial laceration that required pressure to control bleeding. Swelling and bruising have improved, and absorbable sutures are dissolving. She can see but is unsure if the eyelid will stay fully open due to residual swelling.  She has recurrent falls and previously completed fall prevention training, which helped her get up after this fall. She did not use the arms of the potty chair during the fall, and the chair has since been repositioned for safety. She has difficulty bathing independently and is considering grab bars. Knee pain prevents her from tolerating pressure for more than a second without a pillow, limiting her ability to get up from the floor and perform some activities.  Her daughter feels she does not fully express her needs to clinicians, which may underestimate her frailty. She was  discharged from physical therapy but still needs assistance with fall prevention and activities of daily living.  She has received her flu shot.         Review of Systems      Objective    BP 124/68 Comment: at recent appt      Physical Exam Constitutional:      Comments: Facial bruising  HENT:     Head: Normocephalic.  Pulmonary:     Effort: Pulmonary effort is normal. No respiratory distress.  Neurological:     Mental Status: She is alert and oriented to person, place, and time. Mental status is at baseline.        Assessment & Plan     Problem List Items Addressed This Visit       Cardiovascular and Mediastinum   Atrial fibrillation [facility])   F/b cardiology Continue to monitor      Hypertension - Primary     Endocrine   Adult hypothyroidism   Will recheck at next visit Previously well controlled        Musculoskeletal and Integument   Rheumatoid arthritis (HCC)   F/b Rheum On Simponi  Aria infusions      Relevant Orders   Ambulatory referral to Home Health     Other   Prediabetes   Hypercholesteremia   Poor mobility   Relevant Orders   Ambulatory referral to Home Health   Falls   Relevant Orders   Ambulatory referral to Home Health   Decreased activities of daily living (ADL)   Relevant  Orders   Ambulatory referral to Home Health        Fall with facial laceration and impaired mobility Recent fall resulted in facial laceration and impaired mobility. Swelling and bruising are present, with improvement expected as swelling decreases. No significant eye injury reported. Concerns about fall prevention and home safety were discussed, emphasizing the need for professional guidance to prevent future falls. - Initiated physical therapy for fall prevention and safe ambulation. - Initiated occupational therapy for self-care and adaptive home modifications. - Ensured home safety measures, including grab bars and adaptive equipment, are in  place.  Rheumatoid arthritis Recent labs reviewed, with no new concerns.  Primary hypertension Blood pressure is well-controlled, as evidenced by recent readings at the gynecology appointment.  Hypercholesterolemia Cholesterol levels to be monitored with upcoming labs in May. - Will order cholesterol labs in May.  General health maintenance Flu shot administered. Emphasis on hand hygiene and avoiding public places during flu season. Memory test to be conducted during wellness visit. - Continue to maintain hand hygiene and avoid public places during flu season. - Will conduct memory test during wellness visit.         No orders of the defined types were placed in this encounter.    Return in about 4 months (around 07/16/2024) for chronic disease f/u.     I discussed the assessment and treatment plan with the patient. The patient was provided an opportunity to ask questions and all were answered. The patient agreed with the plan and demonstrated an understanding of the instructions.   The patient was advised to call back or seek an in-person evaluation if the symptoms worsen or if the condition fails to improve as anticipated.  Jon Eva, MD [facility] [phone] (phone) [phone] (fax)  [facility]   "

## 2024-03-18 NOTE — Assessment & Plan Note (Signed)
 F/b cardiology Continue to monitor

## 2024-03-18 NOTE — Assessment & Plan Note (Signed)
 Will recheck at next visit Previously well controlled

## 2024-03-24 ENCOUNTER — Telehealth: Payer: Self-pay

## 2024-03-24 NOTE — Patient Instructions (Signed)
 Artemisa ONEIDA Leek - I am sorry I was unable to reach you today for our scheduled appointment. I work with Bacigalupo, Jon HERO, MD and am calling to support your healthcare needs. Please contact me at [phone] at your earliest convenience. I look forward to speaking with you soon.   Thank you,  Nestora Duos, MSN, RN [facility]  [facility], [facility] RN Care Manager Direct Dial: [phone] Fax: [phone]

## 2024-03-26 ENCOUNTER — Ambulatory Visit: Admit: 2024-03-26 | Admitting: Ophthalmology

## 2024-03-26 SURGERY — PHACOEMULSIFICATION, CATARACT, WITH IOL INSERTION
Anesthesia: Topical | Laterality: Right

## 2024-04-01 ENCOUNTER — Other Ambulatory Visit: Payer: Self-pay

## 2024-04-01 DIAGNOSIS — G902 Horner's syndrome: Secondary | ICD-10-CM

## 2024-04-03 ENCOUNTER — Telehealth: Payer: Self-pay

## 2024-04-07 ENCOUNTER — Other Ambulatory Visit: Payer: Self-pay | Admitting: Family Medicine

## 2024-04-08 ENCOUNTER — Telehealth: Payer: Self-pay | Admitting: Family Medicine

## 2024-04-08 ENCOUNTER — Other Ambulatory Visit: Payer: Self-pay

## 2024-04-08 MED ORDER — FLUVOXAMINE MALEATE 50 MG PO TABS: 150.0000 mg | ORAL_TABLET | Freq: Every day | ORAL | 1 refills | Status: AC

## 2024-04-08 NOTE — Telephone Encounter (Signed)
 [facility] faxed refill request for the following medications:  fluvoxaMINE  (LUVOX ) 50 MG tablet     Please advise.

## 2024-04-08 NOTE — Telephone Encounter (Signed)
 Converted to rx refill. Sent to pcp for review per protocol.

## 2024-04-08 NOTE — Telephone Encounter (Signed)
 LOV 12/04/23 NOV n/a LRF 01/07/24 q270 r0

## 2024-04-09 NOTE — Telephone Encounter (Signed)
 Duplicate request, refilled 04/08/24.  Requested Prescriptions  Pending Prescriptions Disp Refills   fluvoxaMINE  (LUVOX ) 50 MG tablet 270 tablet 0    Sig: Take 3 tablets (150 mg total) by mouth at bedtime.     Psychiatry:  Antidepressants - SSRI Passed - 04/09/2024 11:42 AM      Passed - Completed PHQ-2 or PHQ-9 in the last 360 days      Passed - Valid encounter within last 6 months    Recent Outpatient Visits           3 weeks ago Primary hypertension   [facility] [place], Jon HERO, MD   4 months ago Primary hypertension   [facility] [place], Jon HERO, MD   7 months ago [facility] [place], Jon HERO, MD   8 months ago Primary hypertension   [facility] [place], Jon HERO, MD   9 months ago Laceration of forehead, subsequent encounter   [facility] [place], Jon HERO, MD       Future Appointments             In 1 month MacDiarmid, [place]end[place], MD [facility]

## 2024-04-10 ENCOUNTER — Inpatient Hospital Stay: Admission: RE | Admit: 2024-04-10 | Discharge: 2024-04-10

## 2024-04-10 DIAGNOSIS — G902 Horner's syndrome: Secondary | ICD-10-CM

## 2024-04-10 MED ORDER — IOPAMIDOL (ISOVUE-370) INJECTION 76%
75.0000 mL | Freq: Once | INTRAVENOUS | Status: AC | PRN
  Administered 2024-04-10: 75 mL via INTRAVENOUS

## 2024-04-18 ENCOUNTER — Telehealth: Payer: Self-pay

## 2024-05-26 ENCOUNTER — Ambulatory Visit: Admitting: Urology

## 2025-01-27 ENCOUNTER — Ambulatory Visit (INDEPENDENT_AMBULATORY_CARE_PROVIDER_SITE_OTHER): Admitting: Vascular Surgery

## 2025-02-03 ENCOUNTER — Ambulatory Visit
